# Patient Record
Sex: Female | Born: 1945 | Race: White | Hispanic: No | Marital: Married | Smoking: Never smoker
Health system: Southern US, Community
[De-identification: ages and names within clinical notes are randomized; demographics above are authoritative.]

## PROBLEM LIST (undated history)

## (undated) DIAGNOSIS — I890 Lymphedema, not elsewhere classified: Secondary | ICD-10-CM

## (undated) DIAGNOSIS — C189 Malignant neoplasm of colon, unspecified: Secondary | ICD-10-CM

## (undated) DIAGNOSIS — I4821 Permanent atrial fibrillation: Secondary | ICD-10-CM

## (undated) DIAGNOSIS — K5790 Diverticulosis of intestine, part unspecified, without perforation or abscess without bleeding: Secondary | ICD-10-CM

## (undated) DIAGNOSIS — K429 Umbilical hernia without obstruction or gangrene: Secondary | ICD-10-CM

## (undated) DIAGNOSIS — I4891 Unspecified atrial fibrillation: Secondary | ICD-10-CM

## (undated) DIAGNOSIS — I959 Hypotension, unspecified: Secondary | ICD-10-CM

## (undated) DIAGNOSIS — K859 Acute pancreatitis without necrosis or infection, unspecified: Secondary | ICD-10-CM

## (undated) DIAGNOSIS — E039 Hypothyroidism, unspecified: Secondary | ICD-10-CM

## (undated) DIAGNOSIS — I5042 Chronic combined systolic (congestive) and diastolic (congestive) heart failure: Secondary | ICD-10-CM

## (undated) DIAGNOSIS — K810 Acute cholecystitis: Secondary | ICD-10-CM

## (undated) DIAGNOSIS — E669 Obesity, unspecified: Secondary | ICD-10-CM

---

## 2005-12-27 ENCOUNTER — Ambulatory Visit: Payer: Self-pay | Admitting: Family Medicine

## 2006-01-03 ENCOUNTER — Ambulatory Visit: Payer: Self-pay | Admitting: Family Medicine

## 2006-01-31 ENCOUNTER — Ambulatory Visit: Payer: Self-pay | Admitting: Family Medicine

## 2012-05-27 DIAGNOSIS — R1084 Generalized abdominal pain: Secondary | ICD-10-CM

## 2012-10-10 ENCOUNTER — Emergency Department (HOSPITAL_COMMUNITY)
Admission: EM | Admit: 2012-10-10 | Discharge: 2012-10-10 | Disposition: A | Discharge status: Home / Self Care | Payer: Self-pay | Attending: Emergency Medicine | Admitting: Emergency Medicine

## 2012-10-10 ENCOUNTER — Emergency Department (HOSPITAL_COMMUNITY): Payer: Self-pay

## 2012-10-10 ENCOUNTER — Encounter (HOSPITAL_COMMUNITY): Payer: Self-pay | Admitting: *Deleted

## 2012-10-10 DIAGNOSIS — R109 Unspecified abdominal pain: Secondary | ICD-10-CM | POA: Insufficient documentation

## 2012-10-10 DIAGNOSIS — R110 Nausea: Secondary | ICD-10-CM | POA: Insufficient documentation

## 2012-10-10 LAB — HEPATIC FUNCTION PANEL
Bilirubin, Direct: 0.1 mg/dL (ref 0.0–0.3)
Indirect Bilirubin: 0.1 mg/dL — ABNORMAL LOW (ref 0.3–0.9)
Total Bilirubin: 0.2 mg/dL — ABNORMAL LOW (ref 0.3–1.2)

## 2012-10-10 LAB — CBC WITH DIFFERENTIAL/PLATELET
Basophils Absolute: 0 10*3/uL (ref 0.0–0.1)
Basophils Relative: 0 % (ref 0–1)
Eosinophils Relative: 2 % (ref 0–5)
HCT: 39.3 % (ref 36.0–46.0)
MCHC: 33.1 g/dL (ref 30.0–36.0)
MCV: 86.4 fL (ref 78.0–100.0)
Monocytes Absolute: 0.7 10*3/uL (ref 0.1–1.0)
Neutro Abs: 7 10*3/uL (ref 1.7–7.7)
Platelets: 228 10*3/uL (ref 150–400)
RDW: 13.7 % (ref 11.5–15.5)
WBC: 10.3 10*3/uL (ref 4.0–10.5)

## 2012-10-10 LAB — TROPONIN I: Troponin I: <0.3 ng/mL (ref <0.30)

## 2012-10-10 LAB — BASIC METABOLIC PANEL
Calcium: 9.6 mg/dL (ref 8.4–10.5)
Creatinine, Ser: 0.69 mg/dL (ref 0.50–1.10)
GFR calc Af Amer: >90 mL/min (ref >90)
Sodium: 132 mEq/L — ABNORMAL LOW (ref 135–145)

## 2012-10-10 LAB — LIPASE, BLOOD: Lipase: 23 U/L (ref 11–59)

## 2012-10-10 MED ORDER — ONDANSETRON HCL 4 MG/2ML IJ SOLN
4.0000 mg | Freq: Once | INTRAMUSCULAR | Status: AC
  Administered 2012-10-10: 4 mg via INTRAVENOUS
  Filled 2012-10-10: qty 2

## 2012-10-10 MED ORDER — ONDANSETRON HCL 4 MG PO TABS: 4.0000 mg | ORAL_TABLET | Freq: Four times a day (QID) | ORAL | Status: DC

## 2012-10-10 MED ORDER — MORPHINE SULFATE 2 MG/ML IJ SOLN
2.0000 mg | Freq: Once | INTRAMUSCULAR | Status: AC
  Administered 2012-10-10: 2 mg via INTRAVENOUS
  Filled 2012-10-10: qty 1

## 2012-10-10 MED ORDER — SODIUM CHLORIDE 0.9 % IV SOLN
Freq: Once | INTRAVENOUS | Status: AC
  Administered 2012-10-10: 03:00:00 via INTRAVENOUS

## 2012-10-10 MED ORDER — SIMETHICONE 80 MG PO CHEW
80.0000 mg | CHEWABLE_TABLET | Freq: Once | ORAL | Status: AC
  Administered 2012-10-10: 80 mg via ORAL
  Filled 2012-10-10: qty 1

## 2012-10-10 MED ORDER — PANTOPRAZOLE SODIUM 40 MG PO TBEC
40.0000 mg | DELAYED_RELEASE_TABLET | Freq: Once | ORAL | Status: AC
  Administered 2012-10-10: 40 mg via ORAL
  Filled 2012-10-10: qty 1

## 2012-10-10 MED ORDER — MORPHINE SULFATE 4 MG/ML IJ SOLN
4.0000 mg | Freq: Once | INTRAMUSCULAR | Status: AC
  Administered 2012-10-10: 4 mg via INTRAVENOUS
  Filled 2012-10-10: qty 1

## 2012-10-10 NOTE — ED Notes (Signed)
MD at bedside talking with patient and spouse

## 2012-10-10 NOTE — ED Provider Notes (Signed)
History     CSN: [id number]  Arrival date & time 10/10/12  0147   First MD Initiated Contact with Patient 10/10/12 0148      Chief Complaint  Patient presents with  . Abdominal Pain  . Back Pain    (Consider location/radiation/quality/duration/timing/severity/associated sxs/prior treatment) HPI  Lauren [name] is a 66 y.o. female who presents to the Emergency Department complaining of moderate to severe  abdominal pain that began at 2130 that radiates around to both sides and through to the back. It is accompanied by belching and mild nausea. Denies vomiting or diarrhea. Denies fever and chills. Has taken no medicines.   History reviewed. No pertinent past medical history.  History reviewed. No pertinent past surgical history.  History reviewed. No pertinent family history.  History  Substance Use Topics  . Smoking status: Never Smoker   . Smokeless tobacco: Not on file  . Alcohol Use: No    OB History    Grav Para Term Preterm Abortions TAB SAB Ect Mult Living                  Review of Systems  Constitutional: Negative for fever.       10 Systems reviewed and are negative for acute change except as noted in the HPI.  HENT: Negative for congestion.   Eyes: Negative for discharge and redness.  Respiratory: Negative for cough and shortness of breath.   Cardiovascular: Negative for chest pain.  Gastrointestinal: Positive for nausea and abdominal pain. Negative for vomiting.  Musculoskeletal: Negative for back pain.  Skin: Negative for rash.  Neurological: Negative for syncope, numbness and headaches.  Psychiatric/Behavioral:       No behavior change.    Allergies  Codeine and Compazine  Home Medications   Current Outpatient Rx  Name  Route  Sig  Dispense  Refill  . LEVOTHYROXINE SODIUM PO   Oral   Take by mouth.           BP 155/74  Pulse 62  Temp 97.6 F (36.4 C) (Oral)  Resp 24  Ht 5\' 2"  (1.575 m)  Wt 314 lb (142.429 kg)  BMI 57.43 kg/m2   SpO2 100%  LMP 10/10/2012  Physical Exam  Nursing note and vitals reviewed. Constitutional: She appears well-developed and well-nourished.       Awake, alert, nontoxic appearance.  HENT:  Head: Atraumatic.  Eyes: Right eye exhibits no discharge. Left eye exhibits no discharge.  Neck: Neck supple.  Cardiovascular: Normal heart sounds.   Pulmonary/Chest: Effort normal and breath sounds normal. She exhibits no tenderness.  Abdominal: Soft. Bowel sounds are normal. There is tenderness. There is no rebound and no guarding.       Epigastric pain with palpation.  Genitourinary:       No cva tenderness  Musculoskeletal: She exhibits no tenderness.       Baseline ROM, no obvious new focal weakness.  Neurological:       Mental status and motor strength appears baseline for patient and situation.  Skin: No rash noted.  Psychiatric: She has a normal mood and affect.    ED Course  Procedures (including critical care time) Results for orders placed during the hospital encounter of 10/10/12  CBC WITH DIFFERENTIAL      Component Value Range   WBC 10.3  4.0 - 10.5 K/uL   RBC 4.55  3.87 - 5.11 MIL/uL   Hemoglobin 13.0  12.0 - 15.0 g/dL   HCT 40.9  81.1 -  46.0 %   MCV 86.4  78.0 - 100.0 fL   MCH 28.6  26.0 - 34.0 pg   MCHC 33.1  30.0 - 36.0 g/dL   RDW 16.1  09.6 - 04.5 %   Platelets 228  150 - 400 K/uL   Neutrophils Relative 68  43 - 77 %   Neutro Abs 7.0  1.7 - 7.7 K/uL   Lymphocytes Relative 22  12 - 46 %   Lymphs Abs 2.3  0.7 - 4.0 K/uL   Monocytes Relative 7  3 - 12 %   Monocytes Absolute 0.7  0.1 - 1.0 K/uL   Eosinophils Relative 2  0 - 5 %   Eosinophils Absolute 0.2  0.0 - 0.7 K/uL   Basophils Relative 0  0 - 1 %   Basophils Absolute 0.0  0.0 - 0.1 K/uL  BASIC METABOLIC PANEL      Component Value Range   Sodium 132 (*) 135 - 145 mEq/L   Potassium 3.9  3.5 - 5.1 mEq/L   Chloride 97  96 - 112 mEq/L   CO2 22  19 - 32 mEq/L   Glucose, Bld 139 (*) 70 - 99 mg/dL   BUN 14  6 - 23  mg/dL   Creatinine, Ser 4.09  0.50 - 1.10 mg/dL   Calcium 9.6  8.4 - 81.1 mg/dL   GFR calc non Af Amer >90  >90 mL/min   GFR calc Af Amer >90  >90 mL/min  LIPASE, BLOOD      Component Value Range   Lipase 23  11 - 59 U/L  TROPONIN I      Component Value Range   Troponin I <0.30  <0.30 ng/mL  HEPATIC FUNCTION PANEL      Component Value Range   Total Protein 7.2  6.0 - 8.3 g/dL   Albumin 3.6  3.5 - 5.2 g/dL   AST 11  0 - 37 U/L   ALT 13  0 - 35 U/L   Alkaline Phosphatase 78  39 - 117 U/L   Total Bilirubin 0.2 (*) 0.3 - 1.2 mg/dL   Bilirubin, Direct 0.1  0.0 - 0.3 mg/dL   Indirect Bilirubin 0.1 (*) 0.3 - 0.9 mg/dL     Date: 91/47/8295   0242  Rate: 62  Rhythm: normal sinus rhythm with PACs  QRS Axis: normal  Intervals: normal  ST/T Wave abnormalities: normal  Conduction Disutrbances: none  Narrative Interpretation: unremarkable  Dg Abd Acute W/chest  10/10/2012  *RADIOLOGY REPORT*  Clinical Data: Abdominal pain  ACUTE ABDOMEN SERIES (ABDOMEN 2 VIEW & CHEST 1 VIEW)  Comparison: None.  Findings: Mild retrocardiac/ lingular opacity.  Heart size upper normal to mildly enlarged. No free intraperitoneal air. The bowel gas pattern is non-obstructive. Organ outlines are normal where seen. No acute or aggressive osseous abnormality identified.  IMPRESSION: Mild lingular/retrocardiac opacity; atelectasis versus infiltrate.  Nonobstructive bowel gas pattern.   Original Report Authenticated By: Jearld Lesch, M.D.       MDM  Patient with upper abdominal pain. Given analgesics x 2, antiemetic and simethicone with some relief. Labs unremarkable. EKG and troponin negative. Acute abdominal series with normal bowel gas pattern. Dx testing d/w pt and family.  Questions answered.  Verb understanding, agreeable to d/c home. Pt stable in ED with no significant deterioration in condition.The patient appears reasonably screened and/or stabilized for discharge and I doubt any other medical condition  or other [facility] requiring further screening, evaluation, or treatment in  the ED at this time prior to discharge.  MDM Reviewed: nursing note and vitals Interpretation: labs and x-ray           Nicoletta Dress. Colon Branch, MD 10/10/12 [phone]

## 2012-10-10 NOTE — ED Notes (Signed)
Pt c/o severe abdominal pain radiating to back since 9:30pm last night. Pt c/o nausea as well.

## 2012-10-10 NOTE — ED Notes (Addendum)
Constantly moaning with pain.  States she has absolutely no relief from medication.  MD informed and wants xrays completed prior to additional medication.  Patient does appear to be slightly more comfortable than upon arrival, is supine on stretcher and upon arrival could not be still.  Questioned about her food intake tonight.  Has been dieting and tonight ate small amounts of ice cream twice before the pain began

## 2012-10-10 NOTE — ED Notes (Signed)
C/o nausea and pain into her back

## 2012-10-10 NOTE — ED Notes (Signed)
Patient followed simethicon tablet with mountain dew.  Offered water - she declined. [place] Spaniel it would make her vomit.  Advised the [facility] will only add more gas to her with the carbonation in the drink.

## 2012-12-04 ENCOUNTER — Encounter (HOSPITAL_COMMUNITY): Payer: Self-pay | Admitting: Emergency Medicine

## 2012-12-04 ENCOUNTER — Emergency Department (HOSPITAL_COMMUNITY): Payer: Self-pay

## 2012-12-04 ENCOUNTER — Inpatient Hospital Stay (HOSPITAL_COMMUNITY)
Admission: EM | Admit: 2012-12-04 | Discharge: 2012-12-07 | DRG: 439 | Disposition: A | Discharge status: Home / Self Care | Payer: MEDICAID | Attending: Internal Medicine | Admitting: Internal Medicine

## 2012-12-04 ENCOUNTER — Inpatient Hospital Stay (HOSPITAL_COMMUNITY): Payer: Self-pay

## 2012-12-04 DIAGNOSIS — K8020 Calculus of gallbladder without cholecystitis without obstruction: Secondary | ICD-10-CM

## 2012-12-04 DIAGNOSIS — I4891 Unspecified atrial fibrillation: Secondary | ICD-10-CM | POA: Diagnosis not present

## 2012-12-04 DIAGNOSIS — E669 Obesity, unspecified: Secondary | ICD-10-CM | POA: Diagnosis present

## 2012-12-04 DIAGNOSIS — Z6841 Body Mass Index (BMI) 40.0 and over, adult: Secondary | ICD-10-CM

## 2012-12-04 DIAGNOSIS — K429 Umbilical hernia without obstruction or gangrene: Secondary | ICD-10-CM | POA: Diagnosis present

## 2012-12-04 DIAGNOSIS — K859 Acute pancreatitis without necrosis or infection, unspecified: Principal | ICD-10-CM | POA: Diagnosis present

## 2012-12-04 DIAGNOSIS — E876 Hypokalemia: Secondary | ICD-10-CM | POA: Diagnosis present

## 2012-12-04 DIAGNOSIS — E039 Hypothyroidism, unspecified: Secondary | ICD-10-CM | POA: Diagnosis present

## 2012-12-04 DIAGNOSIS — R7989 Other specified abnormal findings of blood chemistry: Secondary | ICD-10-CM | POA: Diagnosis present

## 2012-12-04 DIAGNOSIS — D72829 Elevated white blood cell count, unspecified: Secondary | ICD-10-CM | POA: Diagnosis present

## 2012-12-04 DIAGNOSIS — R51 Headache: Secondary | ICD-10-CM | POA: Diagnosis present

## 2012-12-04 DIAGNOSIS — K8000 Calculus of gallbladder with acute cholecystitis without obstruction: Secondary | ICD-10-CM | POA: Diagnosis present

## 2012-12-04 DIAGNOSIS — K810 Acute cholecystitis: Secondary | ICD-10-CM | POA: Diagnosis present

## 2012-12-04 HISTORY — DX: Hypothyroidism, unspecified: E03.9

## 2012-12-04 HISTORY — DX: Obesity, unspecified: E66.9

## 2012-12-04 HISTORY — DX: Acute cholecystitis: K81.0

## 2012-12-04 HISTORY — DX: Diverticulosis of intestine, part unspecified, without perforation or abscess without bleeding: K57.90

## 2012-12-04 HISTORY — DX: Acute pancreatitis without necrosis or infection, unspecified: K85.90

## 2012-12-04 HISTORY — DX: Unspecified atrial fibrillation: I48.91

## 2012-12-04 HISTORY — DX: Umbilical hernia without obstruction or gangrene: K42.9

## 2012-12-04 LAB — CBC WITH DIFFERENTIAL/PLATELET
Eosinophils Absolute: 0.1 10*3/uL (ref 0.0–0.7)
Lymphocytes Relative: 13 % (ref 12–46)
Lymphs Abs: 1.4 10*3/uL (ref 0.7–4.0)
Neutro Abs: 8.8 10*3/uL — ABNORMAL HIGH (ref 1.7–7.7)
Neutrophils Relative %: 79 % — ABNORMAL HIGH (ref 43–77)
Platelets: 231 10*3/uL (ref 150–400)
RBC: 4.49 MIL/uL (ref 3.87–5.11)
WBC: 11.1 10*3/uL — ABNORMAL HIGH (ref 4.0–10.5)

## 2012-12-04 LAB — COMPREHENSIVE METABOLIC PANEL
ALT: 175 U/L — ABNORMAL HIGH (ref 0–35)
Alkaline Phosphatase: 180 U/L — ABNORMAL HIGH (ref 39–117)
CO2: 24 mEq/L (ref 19–32)
Chloride: 100 mEq/L (ref 96–112)
GFR calc Af Amer: >90 mL/min (ref >90)
Glucose, Bld: 136 mg/dL — ABNORMAL HIGH (ref 70–99)
Potassium: 3.7 mEq/L (ref 3.5–5.1)
Sodium: 134 mEq/L — ABNORMAL LOW (ref 135–145)
Total Protein: 7.4 g/dL (ref 6.0–8.3)

## 2012-12-04 LAB — URINALYSIS, ROUTINE W REFLEX MICROSCOPIC
Glucose, UA: NEGATIVE mg/dL
Protein, ur: NEGATIVE mg/dL
Specific Gravity, Urine: 1.02 (ref 1.005–1.030)

## 2012-12-04 LAB — URINE MICROSCOPIC-ADD ON

## 2012-12-04 MED ORDER — HYDROMORPHONE HCL PF 1 MG/ML IJ SOLN
1.0000 mg | INTRAMUSCULAR | Status: DC | PRN
  Administered 2012-12-04 – 2012-12-05 (×6): 1 mg via INTRAVENOUS
  Filled 2012-12-04 (×6): qty 1

## 2012-12-04 MED ORDER — FAMOTIDINE IN NACL 20-0.9 MG/50ML-% IV SOLN
20.0000 mg | Freq: Once | INTRAVENOUS | Status: AC
  Administered 2012-12-04: 20 mg via INTRAVENOUS
  Filled 2012-12-04: qty 50

## 2012-12-04 MED ORDER — SODIUM CHLORIDE 0.9 % IV SOLN
INTRAVENOUS | Status: DC
  Administered 2012-12-04: 19:00:00 via INTRAVENOUS
  Administered 2012-12-04: 75 mL/h via INTRAVENOUS
  Administered 2012-12-04 – 2012-12-05 (×2): via INTRAVENOUS

## 2012-12-04 MED ORDER — IOHEXOL 300 MG/ML  SOLN
50.0000 mL | Freq: Once | INTRAMUSCULAR | Status: AC | PRN
  Administered 2012-12-04: 50 mL via ORAL

## 2012-12-04 MED ORDER — FENTANYL CITRATE 0.05 MG/ML IJ SOLN
50.0000 ug | INTRAMUSCULAR | Status: AC | PRN
  Administered 2012-12-04 (×2): 50 ug via INTRAVENOUS
  Filled 2012-12-04 (×2): qty 2

## 2012-12-04 MED ORDER — ONDANSETRON HCL 4 MG PO TABS: 4.0000 mg | ORAL_TABLET | Freq: Four times a day (QID) | ORAL | Status: DC | PRN

## 2012-12-04 MED ORDER — ONDANSETRON HCL 4 MG/2ML IJ SOLN
4.0000 mg | INTRAMUSCULAR | Status: DC | PRN
  Administered 2012-12-04: 4 mg via INTRAVENOUS
  Filled 2012-12-04: qty 2

## 2012-12-04 MED ORDER — MORPHINE SULFATE 4 MG/ML IJ SOLN
4.0000 mg | INTRAMUSCULAR | Status: DC | PRN
  Administered 2012-12-04: 4 mg via INTRAVENOUS
  Filled 2012-12-04: qty 1

## 2012-12-04 MED ORDER — LEVOTHYROXINE SODIUM 100 MCG PO TABS
100.0000 ug | ORAL_TABLET | Freq: Every day | ORAL | Status: DC
  Administered 2012-12-04 – 2012-12-07 (×4): 100 ug via ORAL
  Filled 2012-12-04 (×4): qty 1

## 2012-12-04 MED ORDER — HEPARIN SODIUM (PORCINE) 5000 UNIT/ML IJ SOLN
5000.0000 [IU] | Freq: Three times a day (TID) | INTRAMUSCULAR | Status: DC
  Administered 2012-12-04 – 2012-12-07 (×10): 5000 [IU] via SUBCUTANEOUS
  Filled 2012-12-04 (×10): qty 1

## 2012-12-04 MED ORDER — ONDANSETRON HCL 4 MG/2ML IJ SOLN
4.0000 mg | Freq: Four times a day (QID) | INTRAMUSCULAR | Status: DC | PRN
  Administered 2012-12-04 – 2012-12-05 (×3): 4 mg via INTRAVENOUS
  Filled 2012-12-04 (×3): qty 2

## 2012-12-04 MED ORDER — IOHEXOL 300 MG/ML  SOLN
100.0000 mL | Freq: Once | INTRAMUSCULAR | Status: AC | PRN
  Administered 2012-12-04: 100 mL via INTRAVENOUS

## 2012-12-04 NOTE — ED Provider Notes (Signed)
History     CSN: [id number]  Arrival date & time 12/04/12  4696   First MD Initiated Contact with Patient 12/04/12 [phone]      Chief Complaint  Patient presents with  . Abdominal Pain     HPI Pt was seen at 1025.   Per pt, c/o gradual onset and persistence of constant upper abd "pain" since yesterday.  Has been associated with nausea.  Describes the pain as "throbbing," and radiating into her back.  Denies diarrhea, no fevers, no flank pain, no rash, no CP/SOB, no black or blood in stools or emesis.       Past Medical History  Diagnosis Date  . Thyroid disease   . Obesity   . Diverticulosis   . Periumbilical hernia     History reviewed. No pertinent past surgical history.   History  Substance Use Topics  . Smoking status: Never Smoker   . Smokeless tobacco: Not on file  . Alcohol Use: No      Review of Systems ROS: Statement: All systems negative except as marked or noted in the HPI; Constitutional: Negative for fever and chills. ; ; Eyes: Negative for eye pain, redness and discharge. ; ; ENMT: Negative for ear pain, hoarseness, nasal congestion, sinus pressure and sore throat. ; ; Cardiovascular: Negative for chest pain, palpitations, diaphoresis, dyspnea and peripheral edema. ; ; Respiratory: Negative for cough, wheezing and stridor. ; ; Gastrointestinal: +abd pain, nausea. Negative for vomiting, diarrhea, blood in stool, hematemesis, jaundice and rectal bleeding. . ; ; Genitourinary: Negative for dysuria, flank pain and hematuria. ; ; Musculoskeletal: Negative for back pain and neck pain. Negative for swelling and trauma.; ; Skin: Negative for pruritus, rash, abrasions, blisters, bruising and skin lesion.; ; Neuro: Negative for headache, lightheadedness and neck stiffness. Negative for weakness, altered level of consciousness , altered mental status, extremity weakness, paresthesias, involuntary movement, seizure and syncope.       Allergies  Codeine and  Compazine  Home Medications   Current Outpatient Rx  Name  Route  Sig  Dispense  Refill  . LEVOTHYROXINE SODIUM 100 MCG PO TABS   Oral   Take 100 mcg by mouth daily.           LMP 10/04/2012  Physical Exam 1030: Physical examination:  Nursing notes reviewed; Vital signs and O2 SAT reviewed;  Constitutional: Well developed, Well nourished, Well hydrated, Uncomfortable appearing; Head:  Normocephalic, atraumatic; Eyes: EOMI, PERRL, No scleral icterus; ENMT: Mouth and pharynx normal, Mucous membranes moist; Neck: Supple, Full range of motion, No lymphadenopathy; Cardiovascular: Regular rate and rhythm, No gallop; Respiratory: Breath sounds clear & equal bilaterally, No rales, rhonchi, wheezes.  Speaking full sentences with ease, Normal respiratory effort/excursion; Chest: Nontender, Movement normal; Abdomen: Soft, obese. +RUQ, mid-epigastric, LUQ tender to palp. Nondistended, Normal bowel sounds;; Extremities: Pulses normal, No tenderness, No edema, No calf edema or asymmetry.; Neuro: AA&Ox3, Major CN grossly intact.  Speech clear. No gross focal motor or sensory deficits in extremities.; Skin: Color normal, Warm, Dry.   ED Course  Procedures    MDM  MDM Reviewed: nursing note, vitals and previous chart Reviewed previous: labs and ECG Interpretation: labs, x-ray, CT scan and ECG    Date: 12/04/2012  Rate: 62  Rhythm: normal sinus rhythm  QRS Axis: normal  Intervals: normal  ST/T Wave abnormalities: normal  Conduction Disutrbances:none  Narrative Interpretation:   Old EKG Reviewed: unchanged; no significant changes compared to previous EKG dated 10/10/2012.   Results for  orders placed during the hospital encounter of 12/04/12  CBC WITH DIFFERENTIAL      Component Value Range   WBC 11.1 (*) 4.0 - 10.5 K/uL   RBC 4.49  3.87 - 5.11 MIL/uL   Hemoglobin 12.9  12.0 - 15.0 g/dL   HCT 16.1  09.6 - 04.5 %   MCV 86.4  78.0 - 100.0 fL   MCH 28.7  26.0 - 34.0 pg   MCHC 33.2  30.0  - 36.0 g/dL   RDW 40.9  81.1 - 91.4 %   Platelets 231  150 - 400 K/uL   Neutrophils Relative 79 (*) 43 - 77 %   Neutro Abs 8.8 (*) 1.7 - 7.7 K/uL   Lymphocytes Relative 13  12 - 46 %   Lymphs Abs 1.4  0.7 - 4.0 K/uL   Monocytes Relative 8  3 - 12 %   Monocytes Absolute 0.8  0.1 - 1.0 K/uL   Eosinophils Relative 1  0 - 5 %   Eosinophils Absolute 0.1  0.0 - 0.7 K/uL   Basophils Relative 0  0 - 1 %   Basophils Absolute 0.0  0.0 - 0.1 K/uL  COMPREHENSIVE METABOLIC PANEL      Component Value Range   Sodium 134 (*) 135 - 145 mEq/L   Potassium 3.7  3.5 - 5.1 mEq/L   Chloride 100  96 - 112 mEq/L   CO2 24  19 - 32 mEq/L   Glucose, Bld 136 (*) 70 - 99 mg/dL   BUN 8  6 - 23 mg/dL   Creatinine, Ser 7.82  0.50 - 1.10 mg/dL   Calcium 9.2  8.4 - 95.6 mg/dL   Total Protein 7.4  6.0 - 8.3 g/dL   Albumin 3.3 (*) 3.5 - 5.2 g/dL   AST 213 (*) 0 - 37 U/L   ALT 175 (*) 0 - 35 U/L   Alkaline Phosphatase 180 (*) 39 - 117 U/L   Total Bilirubin 2.2 (*) 0.3 - 1.2 mg/dL   GFR calc non Af Amer >90  >90 mL/min   GFR calc Af Amer >90  >90 mL/min  LIPASE, BLOOD      Component Value Range   Lipase >3000 (*) 11 - 59 U/L  TROPONIN I      Component Value Range   Troponin I <0.30  <0.30 ng/mL  URINALYSIS, ROUTINE W REFLEX MICROSCOPIC      Component Value Range   Color, Urine AMBER (*) YELLOW   APPearance HAZY (*) CLEAR   Specific Gravity, Urine 1.020  1.005 - 1.030   pH 6.5  5.0 - 8.0   Glucose, UA NEGATIVE  NEGATIVE mg/dL   Hgb urine dipstick SMALL (*) NEGATIVE   Bilirubin Urine MODERATE (*) NEGATIVE   Ketones, ur NEGATIVE  NEGATIVE mg/dL   Protein, ur NEGATIVE  NEGATIVE mg/dL   Urobilinogen, UA 1.0  0.0 - 1.0 mg/dL   Nitrite NEGATIVE  NEGATIVE   Leukocytes, UA NEGATIVE  NEGATIVE  URINE MICROSCOPIC-ADD ON      Component Value Range   Squamous Epithelial / LPF FEW (*) RARE   RBC / HPF 3-6  <3 RBC/hpf   Ct Abdomen Pelvis W Contrast 12/04/2012  *RADIOLOGY REPORT*  Clinical Data: Abdominal pain.   Nausea and vomiting.  CT ABDOMEN AND PELVIS WITH CONTRAST  Technique:  Multidetector CT imaging of the abdomen and pelvis was performed following the standard protocol during bolus administration of intravenous contrast.  Contrast:  100 ml Omnipaque-300 and  oral contrast  Comparison: 05/27/2012  Findings: There appears to be mild swelling of the pancreatic tail with a subtle peripancreatic soft tissue stranding which is new since previous study.  This is suspicious for mild acute pancreatitis.  There is no evidence of pancreatic mass or pancreatic ductal dilatation. No evidence of pancreatic necrosis or peri pancreatic fluid collections.  Gallbladder is unremarkable, and the there is no evidence of biliary ductal dilatation.  The liver, spleen, pancreas, and kidneys are normal in appearance.  No evidence of hydronephrosis. No soft tissue masses or lymphadenopathy identified within the abdomen or pelvis.  Uterus and adnexal regions are unremarkable.  Diverticulosis is again seen mainly involving the sigmoid colon, however there is no evidence of diverticulitis.  No evidence of abscess.  No evidence of dilated bowel loops.  A small periumbilical hernia is again seen containing only omental fat.  No herniated bowel loops.  IMPRESSION:  1.  Findings suspicious for mild acute pancreatitis, mainly involving the tail.  Suggest correlation with serum amylase and lipase levels. No evidence of peripancreatic fluid collections or other complication. 2.  Diverticulosis.  No radiographic evidence of diverticulitis. 3.  Stable small periumbilical hernia containing only omental fat.   Original Report Authenticated By: Myles Rosenthal, M.D.    Dg Chest Port 1 View 12/04/2012  *RADIOLOGY REPORT*  Clinical Data: Cough.  Congestive heart failure.  PORTABLE CHEST - 1 VIEW  Comparison: 10/10/2012  Findings: Mild cardiomegaly noted.  Prominent epicardial fat pad appears stable.  The visualized lungs appear clear.  IMPRESSION:  1.  Mild  cardiomegaly with prominent epicardial adipose tissue.   Original Report Authenticated By: Gaylyn Rong, M.D.       1310:  Continues to c/o abd pain and nausea despite multiple doses of meds.  Will re-medicate. Dx and testing d/w pt and family.  Questions answered.  Verb understanding, agreeable to admit.  T/C to [facility] Dr. Karilyn Cota, case discussed, including:  HPI, pertinent PM/SHx, VS/PE, dx testing, ED course and treatment:  Agreeable to admit.        Laray Anger, DO 12/05/12 1951

## 2012-12-04 NOTE — ED Notes (Signed)
Report called to [place], RN on unit 300.  Pt. Taken to [place] from ER.

## 2012-12-04 NOTE — H&P (Signed)
[facility] History and Physical  Lauren [name] ZOX:[id number] DOB: 1946/03/12 DOA: 12/04/2012  Referring physician: Dr. Clarene Duke, ER physician.     Chief Complaint: Abdominal pain.  HPI: Lauren [name] is a 67 y.o. female presents with the third episode of abdominal pain. Her first episode was in July of 2013, her next episode was in November and this episode started last night. Each episode is characterized by abdominal pain, usually umbilical but could be epigastric and always radiating to the back. It is associated with some nausea but no significant vomiting. She had been seen in November in the emergency room with similar symptoms and workup on that occasion was essentially negative. However today and she was seen again her lipase is significantly elevated with elevation of liver enzymes. She is now being admitted for treatment and management of pancreatitis. She denies a history of gallstones. She does not drink alcohol excessively. She has no history of hyperlipidemia. She does have a history of hypothyroidism. She is obese.   Review of Systems: Apart from symptoms in the history of present illness, other systems negative.  Past Medical History  Diagnosis Date  . Thyroid disease   . Obesity   . Diverticulosis   . Periumbilical hernia     Social History:  She is married and lives with her husband. She does not smoke cigarettes. She very occasionally drinks alcohol, and the last thing she had was on Christmas. She is otherwise fully independent.  Allergies  Allergen Reactions  . Codeine Itching  . Compazine (Prochlorperazine Edisylate)     History reviewed. No pertinent family history. there is no family history of gallstones/gallbladder pathology.   Prior to Admission medications   Medication Sig Start Date End Date Taking? Authorizing Provider  levothyroxine (SYNTHROID, LEVOTHROID) 100 MCG tablet Take 100 mcg by mouth daily.   Yes Historical Provider, MD    Physical Exam: Filed Vitals:   12/04/12 1347  BP: 180/76  Pulse: 77  Temp: 97.6 F (36.4 C)  Resp: 18  SpO2: 98%     General:  She is to look systemically well. She is in some pain. She is morbidly obese.  Eyes: No jaundice. No pallor.  ENT: No abnormalities.  Neck: Thick neck. No lymphadenopathy.  Cardiovascular: Heart sounds are present without murmurs or gallop rhythm.  Respiratory: Lung fields are clear.  Abdomen: Abdomen is actually soft and nontender. There no masses felt although this is difficult to determine secondary to her obese abdomen.  Skin: No rashes.  Musculoskeletal: No major joint abnormalities.  Psychiatric: Appropriate affect.  Neurologic: Alert and orientated without any focal neurological signs.  Labs on Admission:  Basic Metabolic Panel:  Lab 12/04/12 8119  NA 134*  K 3.7  CL 100  CO2 24  GLUCOSE 136*  BUN 8  CREATININE 0.66  CALCIUM 9.2  MG --  PHOS --   Liver Function Tests:  Lab 12/04/12 1034  AST 247*  ALT 175*  ALKPHOS 180*  BILITOT 2.2*  PROT 7.4  ALBUMIN 3.3*    Lab 12/04/12 1034  LIPASE >3000*  AMYLASE --    CBC:  Lab 12/04/12 1034  WBC 11.1*  NEUTROABS 8.8*  HGB 12.9  HCT 38.8  MCV 86.4  PLT 231   Cardiac Enzymes:  Lab 12/04/12 1034  CKTOTAL --  CKMB --  CKMBINDEX --  TROPONINI <0.30      Radiological Exams on Admission: Ct Abdomen Pelvis W Contrast  12/04/2012  *RADIOLOGY REPORT*  Clinical Data:  Abdominal pain.  Nausea and vomiting.  CT ABDOMEN AND PELVIS WITH CONTRAST  Technique:  Multidetector CT imaging of the abdomen and pelvis was performed following the standard protocol during bolus administration of intravenous contrast.  Contrast:  100 ml Omnipaque-300 and oral contrast  Comparison: 05/27/2012  Findings: There appears to be mild swelling of the pancreatic tail with a subtle peripancreatic soft tissue stranding which is new since previous study.  This is suspicious for mild acute  pancreatitis.  There is no evidence of pancreatic mass or pancreatic ductal dilatation. No evidence of pancreatic necrosis or peri pancreatic fluid collections.  Gallbladder is unremarkable, and the there is no evidence of biliary ductal dilatation.  The liver, spleen, pancreas, and kidneys are normal in appearance.  No evidence of hydronephrosis. No soft tissue masses or lymphadenopathy identified within the abdomen or pelvis.  Uterus and adnexal regions are unremarkable.  Diverticulosis is again seen mainly involving the sigmoid colon, however there is no evidence of diverticulitis.  No evidence of abscess.  No evidence of dilated bowel loops.  A small periumbilical hernia is again seen containing only omental fat.  No herniated bowel loops.  IMPRESSION:  1.  Findings suspicious for mild acute pancreatitis, mainly involving the tail.  Suggest correlation with serum amylase and lipase levels. No evidence of peripancreatic fluid collections or other complication. 2.  Diverticulosis.  No radiographic evidence of diverticulitis. 3.  Stable small periumbilical hernia containing only omental fat.   Original Report Authenticated By: Myles Rosenthal, M.D.    Dg Chest Port 1 View  12/04/2012  *RADIOLOGY REPORT*  Clinical Data: Cough.  Congestive heart failure.  PORTABLE CHEST - 1 VIEW  Comparison: 10/10/2012  Findings: Mild cardiomegaly noted.  Prominent epicardial fat pad appears stable.  The visualized lungs appear clear.  IMPRESSION:  1.  Mild cardiomegaly with prominent epicardial adipose tissue.   Original Report Authenticated By: Gaylyn Rong, M.D.       Assessment/Plan   1. Acute pancreatitis, likely secondary to gallstone pathology. 2. Elevated liver enzymes, likely secondary to gallstones. 3. Hypothyroidism. 4. Obesity, morbid.  Plan: 1. Admit to medical floor. 2. N.p.o. and IV fluids. 3. Intravenous analgesia as required. 4. Ultrasound of the abdomen in particular looking at the biliary  system. 5. Gastroenterology consultation Further recommendations will depend on patient's hospital progress.  Code Status: Full code.  Family Communication: Discuss plan with patient and husband at the bedside.   Disposition Plan:Home in medically stable.    Time spent: 45 mins  [facility] Pager [phone].  If 7PM-7AM, please contact night-coverage [email or web address] Password [email or web address] 12/04/2012, 2:06 PM

## 2012-12-04 NOTE — ED Notes (Signed)
Severe abd pain. Denies diarrhea and vomiting.

## 2012-12-05 DIAGNOSIS — K859 Acute pancreatitis without necrosis or infection, unspecified: Secondary | ICD-10-CM

## 2012-12-05 LAB — LIPID PANEL
Cholesterol: 190 mg/dL (ref 0–200)
Total CHOL/HDL Ratio: 2.7 RATIO
Triglycerides: 157 mg/dL — ABNORMAL HIGH (ref <150)
VLDL: 31 mg/dL (ref 0–40)

## 2012-12-05 LAB — CBC
Hemoglobin: 12.4 g/dL (ref 12.0–15.0)
MCH: 29.1 pg (ref 26.0–34.0)
Platelets: 213 10*3/uL (ref 150–400)
RBC: 4.26 MIL/uL (ref 3.87–5.11)
WBC: 13.8 10*3/uL — ABNORMAL HIGH (ref 4.0–10.5)

## 2012-12-05 LAB — COMPREHENSIVE METABOLIC PANEL
AST: 108 U/L — ABNORMAL HIGH (ref 0–37)
BUN: 5 mg/dL — ABNORMAL LOW (ref 6–23)
CO2: 25 mEq/L (ref 19–32)
Calcium: 8.5 mg/dL (ref 8.4–10.5)
Chloride: 98 mEq/L (ref 96–112)
Creatinine, Ser: 0.68 mg/dL (ref 0.50–1.10)
GFR calc Af Amer: >90 mL/min (ref >90)
GFR calc non Af Amer: >90 mL/min (ref >90)
Glucose, Bld: 82 mg/dL (ref 70–99)
Total Bilirubin: 0.6 mg/dL (ref 0.3–1.2)

## 2012-12-05 LAB — HEMOGLOBIN A1C: Mean Plasma Glucose: 123 mg/dL — ABNORMAL HIGH (ref <117)

## 2012-12-05 MED ORDER — ONDANSETRON HCL 4 MG PO TABS: 4.0000 mg | ORAL_TABLET | ORAL | Status: DC | PRN

## 2012-12-05 MED ORDER — ONDANSETRON HCL 4 MG/2ML IJ SOLN: 4.0000 mg | Freq: Four times a day (QID) | INTRAMUSCULAR | Status: DC | PRN

## 2012-12-05 MED ORDER — ONDANSETRON HCL 4 MG/2ML IJ SOLN
4.0000 mg | INTRAMUSCULAR | Status: DC | PRN
  Administered 2012-12-05 – 2012-12-07 (×9): 4 mg via INTRAVENOUS
  Filled 2012-12-05 (×9): qty 2

## 2012-12-05 MED ORDER — PANTOPRAZOLE SODIUM 40 MG IV SOLR
40.0000 mg | INTRAVENOUS | Status: DC
  Administered 2012-12-05 – 2012-12-06 (×2): 40 mg via INTRAVENOUS
  Filled 2012-12-05 (×2): qty 40

## 2012-12-05 MED ORDER — HYDROMORPHONE HCL PF 1 MG/ML IJ SOLN
1.0000 mg | INTRAMUSCULAR | Status: DC | PRN
  Administered 2012-12-05 – 2012-12-07 (×14): 1 mg via INTRAVENOUS
  Filled 2012-12-05 (×14): qty 1

## 2012-12-05 MED ORDER — ACETAMINOPHEN 325 MG PO TABS
650.0000 mg | ORAL_TABLET | Freq: Four times a day (QID) | ORAL | Status: DC | PRN
  Administered 2012-12-05: 650 mg via ORAL
  Filled 2012-12-05: qty 2

## 2012-12-05 MED ORDER — DEXTROSE 5 % IV SOLN
1.0000 g | INTRAVENOUS | Status: DC
  Administered 2012-12-05 – 2012-12-06 (×2): 1 g via INTRAVENOUS
  Filled 2012-12-05 (×3): qty 10

## 2012-12-05 NOTE — Progress Notes (Signed)
Subjective: This lady feels reasonably well except for pain. This has been controlled by intravenous hydromorphone. Ultrasound of the abdomen confirmed the presence of gallstones and I think this is likely gallstone pancreatitis.           Physical Exam: Blood pressure 131/76, pulse 76, temperature 100.1 F (37.8 C), temperature source Oral, resp. rate 18, height 5\' 2"  (1.575 m), weight 142.429 kg (314 lb), last menstrual period 10/04/2012, SpO2 93.00%. She looks systemically well. She does not look septic or toxic. Abdomen is soft and really nontender. Bowel sounds are heard and are normal. Lung fields are clear. She is alert and orientated.   Investigations:  No results found for this or any previous visit (from the past 240 hour(s)).   Basic Metabolic Panel:  Basename 12/05/12 0530 12/04/12 1034  NA 133* 134*  K 3.6 3.7  CL 98 100  CO2 25 24  GLUCOSE 82 136*  BUN 5* 8  CREATININE 0.68 0.66  CALCIUM 8.5 9.2  MG -- --  PHOS -- --   Liver Function Tests:  [facility] 12/05/12 0530 12/04/12 1034  AST 108* 247*  ALT 132* 175*  ALKPHOS 173* 180*  BILITOT 0.6 2.2*  PROT 7.2 7.4  ALBUMIN 3.1* 3.3*     CBC:  Basename 12/05/12 0530 12/04/12 1034  WBC 13.8* 11.1*  NEUTROABS -- 8.8*  HGB 12.4 12.9  HCT 37.9 38.8  MCV 89.0 86.4  PLT 213 231    Ct Abdomen Pelvis W Contrast  12/04/2012  *RADIOLOGY REPORT*  Clinical Data: Abdominal pain.  Nausea and vomiting.  CT ABDOMEN AND PELVIS WITH CONTRAST  Technique:  Multidetector CT imaging of the abdomen and pelvis was performed following the standard protocol during bolus administration of intravenous contrast.  Contrast:  100 ml Omnipaque-300 and oral contrast  Comparison: 05/27/2012  Findings: There appears to be mild swelling of the pancreatic tail with a subtle peripancreatic soft tissue stranding which is new since previous study.  This is suspicious for mild acute pancreatitis.  There is no evidence of pancreatic  mass or pancreatic ductal dilatation. No evidence of pancreatic necrosis or peri pancreatic fluid collections.  Gallbladder is unremarkable, and the there is no evidence of biliary ductal dilatation.  The liver, spleen, pancreas, and kidneys are normal in appearance.  No evidence of hydronephrosis. No soft tissue masses or lymphadenopathy identified within the abdomen or pelvis.  Uterus and adnexal regions are unremarkable.  Diverticulosis is again seen mainly involving the sigmoid colon, however there is no evidence of diverticulitis.  No evidence of abscess.  No evidence of dilated bowel loops.  A small periumbilical hernia is again seen containing only omental fat.  No herniated bowel loops.  IMPRESSION:  1.  Findings suspicious for mild acute pancreatitis, mainly involving the tail.  Suggest correlation with serum amylase and lipase levels. No evidence of peripancreatic fluid collections or other complication. 2.  Diverticulosis.  No radiographic evidence of diverticulitis. 3.  Stable small periumbilical hernia containing only omental fat.   Original Report Authenticated By: Myles Rosenthal, M.D.    Dg Chest Port 1 View  12/04/2012  *RADIOLOGY REPORT*  Clinical Data: Cough.  Congestive heart failure.  PORTABLE CHEST - 1 VIEW  Comparison: 10/10/2012  Findings: Mild cardiomegaly noted.  Prominent epicardial fat pad appears stable.  The visualized lungs appear clear.  IMPRESSION:  1.  Mild cardiomegaly with prominent epicardial adipose tissue.   Original Report Authenticated By: Gaylyn Rong, M.D.  US Abdomen Limited Ruq  12/04/2012  *RADIOLOGY REPORT*  Clinical Data:  Acute pancreatitis.  Evaluate for gallstones as the etiology.  LIMITED ABDOMINAL ULTRASOUND - RIGHT UPPER QUADRANT  Comparison:  CT abdomen and pelvis earlier same date.  Findings:  Gallbladder:  Numerous shadowing calculi small amount of associated echogenic sludge.  No gallbladder wall thickening or pericholecystic fluid.  Positive  sonographic Murphy's sign according to the ultrasound technologist.  Common bile duct:  Mildly dilated up to approximately 10 mm diameter centrally.  Obscured distally by duodenal bowel gas.  No visible common duct stones on the earlier CT.  Liver:  Diffusely increased and coarsened echotexture without focal parenchymal abnormality.  Patent portal vein with hepatopetal flow.  IMPRESSION:  1.  Cholelithiasis and gallbladder sludge.  Positive sonographic Murphy's sign without other sonographic findings for acute cholecystitis. 2.  Mild biliary ductal dilation up to 10 mm centrally; distal duct obscured by overlying bowel gas, but no visible bile duct stones on the earlier CT. 3.  Diffuse hepatic steatosis and/or hepatocellular disease without focal hepatic parenchymal abnormality.                    Original Report Authenticated By: Hulan Saas, M.D.       Medications: I have reviewed the patient's current medications.  Impression: 1. Acute pancreatitis. 2. Gallstones likely causing #1. 3. Hypothyroidism. 4. Morbid obesity.     Plan: 1. Try to advance her diet to a clear liquid diet today. 2. Surgical consultation.     LOS: 1 day   Wilson Singer Pager [phone]  12/05/2012, 8:29 AM

## 2012-12-05 NOTE — Consult Note (Signed)
Reason for Consult: Acute pancreatitis Referring Physician: [facility] hospitalist  Lauren [name] is an 67 y.o. female.  HPI: Patient presented to [facility] with epigastric abdominal pain nausea. She had a similar symptomatology several years ago but no intermittent episodes. Pain has persisted in the epigastric region. Having clear liquids today she states the pain is somewhat worse after by mouth intake. She has had associated nausea but no emesis today. She has had subjective fevers and chills. No change in bowel movements. No melena or hematochezia. No alcohol use. No unusual travel. Patient has had a previous pregnancy. No significant family history of biliary disease. Patient states occasional bloating with fatty greasy foods. Patient is never been told she has biliary disease in the past. No history of jaundice.  Past Medical History  Diagnosis Date  . Thyroid disease   . Obesity   . Diverticulosis   . Periumbilical hernia   . Hypothyroidism     History reviewed. No pertinent past surgical history.  History reviewed. No pertinent family history.  Social History:  reports that she has never smoked. She does not have any smokeless tobacco history on file. She reports that she does not drink alcohol or use illicit drugs.  Allergies:  Allergies  Allergen Reactions  . Codeine Itching  . Compazine (Prochlorperazine Edisylate)   . Prednisone Nausea And Vomiting    Medications:  I have reviewed the patient's current medications. Prior to Admission:  Prescriptions prior to admission  Medication Sig Dispense Refill  . levothyroxine (SYNTHROID, LEVOTHROID) 100 MCG tablet Take 100 mcg by mouth daily.       Scheduled:   . heparin  5,000 Units Subcutaneous Q8H  . levothyroxine  100 mcg Oral QAC breakfast   Continuous:   . sodium chloride 75 mL/hr at 12/05/12 1034   ONG:[id number], HYDROmorphone (DILAUDID) injection, ondansetron (ZOFRAN) IV, ondansetron Anti-infectives     None      Results for orders placed during the hospital encounter of 12/04/12 (from the past 48 hour(s))  CBC WITH DIFFERENTIAL     Status: Abnormal   Collection Time   12/04/12 10:34 AM      Component Value Range Comment   WBC 11.1 (*) 4.0 - 10.5 K/uL    RBC 4.49  3.87 - 5.11 MIL/uL    Hemoglobin 12.9  12.0 - 15.0 g/dL    HCT 02.7  25.3 - 66.4 %    MCV 86.4  78.0 - 100.0 fL    MCH 28.7  26.0 - 34.0 pg    MCHC 33.2  30.0 - 36.0 g/dL    RDW 40.3  47.4 - 25.9 %    Platelets 231  150 - 400 K/uL    Neutrophils Relative 79 (*) 43 - 77 %    Neutro Abs 8.8 (*) 1.7 - 7.7 K/uL    Lymphocytes Relative 13  12 - 46 %    Lymphs Abs 1.4  0.7 - 4.0 K/uL    Monocytes Relative 8  3 - 12 %    Monocytes Absolute 0.8  0.1 - 1.0 K/uL    Eosinophils Relative 1  0 - 5 %    Eosinophils Absolute 0.1  0.0 - 0.7 K/uL    Basophils Relative 0  0 - 1 %    Basophils Absolute 0.0  0.0 - 0.1 K/uL   COMPREHENSIVE METABOLIC PANEL     Status: Abnormal   Collection Time   12/04/12 10:34 AM      Component  Value Range Comment   Sodium 134 (*) 135 - 145 mEq/L    Potassium 3.7  3.5 - 5.1 mEq/L    Chloride 100  96 - 112 mEq/L    CO2 24  19 - 32 mEq/L    Glucose, Bld 136 (*) 70 - 99 mg/dL    BUN 8  6 - 23 mg/dL    Creatinine, Ser 2.13  0.50 - 1.10 mg/dL    Calcium 9.2  8.4 - 08.6 mg/dL    Total Protein 7.4  6.0 - 8.3 g/dL    Albumin 3.3 (*) 3.5 - 5.2 g/dL    AST 578 (*) 0 - 37 U/L    ALT 175 (*) 0 - 35 U/L    Alkaline Phosphatase 180 (*) 39 - 117 U/L    Total Bilirubin 2.2 (*) 0.3 - 1.2 mg/dL    GFR calc non Af Amer >90  >90 mL/min    GFR calc Af Amer >90  >90 mL/min   LIPASE, BLOOD     Status: Abnormal   Collection Time   12/04/12 10:34 AM      Component Value Range Comment   Lipase >3000 (*) 11 - 59 U/L   TROPONIN I     Status: Normal   Collection Time   12/04/12 10:34 AM      Component Value Range Comment   Troponin I <0.30  <0.30 ng/mL   TSH     Status: Normal   Collection Time   12/04/12 10:34  AM      Component Value Range Comment   TSH 3.539  0.350 - 4.500 uIU/mL   HEMOGLOBIN A1C     Status: Abnormal   Collection Time   12/04/12 10:34 AM      Component Value Range Comment   Hemoglobin A1C 5.9 (*) <5.7 %    Mean Plasma Glucose 123 (*) <117 mg/dL   URINALYSIS, ROUTINE W REFLEX MICROSCOPIC     Status: Abnormal   Collection Time   12/04/12 10:37 AM      Component Value Range Comment   Color, Urine AMBER (*) YELLOW BIOCHEMICALS MAY BE AFFECTED BY COLOR   APPearance HAZY (*) CLEAR    Specific Gravity, Urine 1.020  1.005 - 1.030    pH 6.5  5.0 - 8.0    Glucose, UA NEGATIVE  NEGATIVE mg/dL    Hgb urine dipstick SMALL (*) NEGATIVE    Bilirubin Urine MODERATE (*) NEGATIVE    Ketones, ur NEGATIVE  NEGATIVE mg/dL    Protein, ur NEGATIVE  NEGATIVE mg/dL    Urobilinogen, UA 1.0  0.0 - 1.0 mg/dL    Nitrite NEGATIVE  NEGATIVE    Leukocytes, UA NEGATIVE  NEGATIVE   URINE MICROSCOPIC-ADD ON     Status: Abnormal   Collection Time   12/04/12 10:37 AM      Component Value Range Comment   Squamous Epithelial / LPF FEW (*) RARE    RBC / HPF 3-6  <3 RBC/hpf   COMPREHENSIVE METABOLIC PANEL     Status: Abnormal   Collection Time   12/05/12  5:30 AM      Component Value Range Comment   Sodium 133 (*) 135 - 145 mEq/L    Potassium 3.6  3.5 - 5.1 mEq/L    Chloride 98  96 - 112 mEq/L    CO2 25  19 - 32 mEq/L    Glucose, Bld 82  70 - 99 mg/dL    BUN 5 (*)  6 - 23 mg/dL    Creatinine, Ser 1.61  0.50 - 1.10 mg/dL    Calcium 8.5  8.4 - 09.6 mg/dL    Total Protein 7.2  6.0 - 8.3 g/dL    Albumin 3.1 (*) 3.5 - 5.2 g/dL    AST 045 (*) 0 - 37 U/L    ALT 132 (*) 0 - 35 U/L    Alkaline Phosphatase 173 (*) 39 - 117 U/L    Total Bilirubin 0.6  0.3 - 1.2 mg/dL    GFR calc non Af Amer >90  >90 mL/min    GFR calc Af Amer >90  >90 mL/min   CBC     Status: Abnormal   Collection Time   12/05/12  5:30 AM      Component Value Range Comment   WBC 13.8 (*) 4.0 - 10.5 K/uL    RBC 4.26  3.87 - 5.11 MIL/uL     Hemoglobin 12.4  12.0 - 15.0 g/dL    HCT 40.9  81.1 - 91.4 %    MCV 89.0  78.0 - 100.0 fL    MCH 29.1  26.0 - 34.0 pg    MCHC 32.7  30.0 - 36.0 g/dL    RDW 78.2  95.6 - 21.3 %    Platelets 213  150 - 400 K/uL   LIPID PANEL     Status: Abnormal   Collection Time   12/05/12  5:31 AM      Component Value Range Comment   Cholesterol 190  0 - 200 mg/dL    Triglycerides 086 (*) <150 mg/dL    HDL 70  >57 mg/dL    Total CHOL/HDL Ratio 2.7      VLDL 31  0 - 40 mg/dL    LDL Cholesterol 89  0 - 99 mg/dL     Ct Abdomen Pelvis W Contrast  12/04/2012  *RADIOLOGY REPORT*  Clinical Data: Abdominal pain.  Nausea and vomiting.  CT ABDOMEN AND PELVIS WITH CONTRAST  Technique:  Multidetector CT imaging of the abdomen and pelvis was performed following the standard protocol during bolus administration of intravenous contrast.  Contrast:  100 ml Omnipaque-300 and oral contrast  Comparison: 05/27/2012  Findings: There appears to be mild swelling of the pancreatic tail with a subtle peripancreatic soft tissue stranding which is new since previous study.  This is suspicious for mild acute pancreatitis.  There is no evidence of pancreatic mass or pancreatic ductal dilatation. No evidence of pancreatic necrosis or peri pancreatic fluid collections.  Gallbladder is unremarkable, and the there is no evidence of biliary ductal dilatation.  The liver, spleen, pancreas, and kidneys are normal in appearance.  No evidence of hydronephrosis. No soft tissue masses or lymphadenopathy identified within the abdomen or pelvis.  Uterus and adnexal regions are unremarkable.  Diverticulosis is again seen mainly involving the sigmoid colon, however there is no evidence of diverticulitis.  No evidence of abscess.  No evidence of dilated bowel loops.  A small periumbilical hernia is again seen containing only omental fat.  No herniated bowel loops.  IMPRESSION:  1.  Findings suspicious for mild acute pancreatitis, mainly involving the tail.   Suggest correlation with serum amylase and lipase levels. No evidence of peripancreatic fluid collections or other complication. 2.  Diverticulosis.  No radiographic evidence of diverticulitis. 3.  Stable small periumbilical hernia containing only omental fat.   Original Report Authenticated By: Myles Rosenthal, M.D.    Dg Chest Port 1 View  12/04/2012  *  RADIOLOGY REPORT*  Clinical Data: Cough.  Congestive heart failure.  PORTABLE CHEST - 1 VIEW  Comparison: 10/10/2012  Findings: Mild cardiomegaly noted.  Prominent epicardial fat pad appears stable.  The visualized lungs appear clear.  IMPRESSION:  1.  Mild cardiomegaly with prominent epicardial adipose tissue.   Original Report Authenticated By: Gaylyn Rong, M.D.    US Abdomen Limited Ruq  12/04/2012  *RADIOLOGY REPORT*  Clinical Data:  Acute pancreatitis.  Evaluate for gallstones as the etiology.  LIMITED ABDOMINAL ULTRASOUND - RIGHT UPPER QUADRANT  Comparison:  CT abdomen and pelvis earlier same date.  Findings:  Gallbladder:  Numerous shadowing calculi small amount of associated echogenic sludge.  No gallbladder wall thickening or pericholecystic fluid.  Positive sonographic Murphy's sign according to the ultrasound technologist.  Common bile duct:  Mildly dilated up to approximately 10 mm diameter centrally.  Obscured distally by duodenal bowel gas.  No visible common duct stones on the earlier CT.  Liver:  Diffusely increased and coarsened echotexture without focal parenchymal abnormality.  Patent portal vein with hepatopetal flow.  IMPRESSION:  1.  Cholelithiasis and gallbladder sludge.  Positive sonographic Murphy's sign without other sonographic findings for acute cholecystitis. 2.  Mild biliary ductal dilation up to 10 mm centrally; distal duct obscured by overlying bowel gas, but no visible bile duct stones on the earlier CT. 3.  Diffuse hepatic steatosis and/or hepatocellular disease without focal hepatic parenchymal abnormality.                     Original Report Authenticated By: Hulan Saas, M.D.     Review of Systems  Constitutional: Positive for fever, chills and diaphoresis. Negative for weight loss and malaise/fatigue.  HENT: Negative.   Eyes: Negative.   Respiratory: Negative.   Cardiovascular: Positive for chest pain. Negative for palpitations.  Gastrointestinal: Positive for heartburn, nausea and abdominal pain (epigastric). Negative for vomiting, diarrhea, constipation, blood in stool and melena.  Genitourinary: Negative.   Musculoskeletal: Negative.   Skin: Negative.   Neurological: Negative.  Negative for weakness.  Endo/Heme/Allergies: Negative.   Psychiatric/Behavioral: Negative.    Blood pressure 131/76, pulse 76, temperature 97.7 F (36.5 C), temperature source Oral, resp. rate 18, height 5\' 2"  (1.575 m), weight 142.429 kg (314 lb), last menstrual period 10/04/2012, SpO2 93.00%. Physical Exam  Constitutional: She is oriented to person, place, and time. She appears well-developed and well-nourished. No distress.       Morbidly obese, uncomfortable in appearance  HENT:  Head: Normocephalic and atraumatic.  Eyes: Conjunctivae normal and EOM are normal. Pupils are equal, round, and reactive to light. No scleral icterus.  Neck: Normal range of motion. Neck supple. No tracheal deviation present. No thyromegaly present.  Cardiovascular: Normal rate and regular rhythm.   Respiratory: Effort normal and breath sounds normal. No respiratory distress.  GI: Soft. She exhibits no distension and no mass. There is tenderness (moderate to severe epigastric abdominal tenderness.). There is guarding. There is no rebound.  Lymphadenopathy:    She has no cervical adenopathy.  Neurological: She is alert and oriented to person, place, and time.  Skin: Skin is warm and dry.    Assessment/Plan: Acute pancreatitis. Likely gallstone etiology. Findings are with the degree of pain the patient still has. I would continue to monitor  the patient's pancreatic enzymes to ensure these are trending downwards. At this time due to her increased pain with oral intake I suggested to the patient proceeding with an n.p.o. status. She has requested  some ice chips and I feel this is reasonable as long as her symptomatology or pancreatic enzymes do not increase. We did discuss the possible risk of progression of pancreatitis. At this time I do feel she will be a candidate for a cholecystectomy to minimize future episodes of pancreatitis however at this time as I discussed with the patient we need to wait for the pancreatitis to resolve before proceeding to the operating room. I will continue to monitor and follow the patient closely with you. I appreciate the opportunity to assist in her care.  Demetrice Combes C 12/05/2012, 1:23 PM

## 2012-12-05 NOTE — Consult Note (Signed)
Referring Provider: No ref. provider found Primary Care Physician:  No primary provider on file.   Reason for Consultation:  Biliary pancreatitis.  HPI:  Patient is 67 year old Caucasian female who's been experiencing intermittent epigastric pain for the last 3-4 months. Pain was never severe and did not last for more than few minutes or an hour. Pain was not associated with nausea vomiting fever or chills. Since she had lost insurance about a year ago she decided not to seek medical advice. She developed more or less sudden onset of severe epigastric pain radiating to the right and posteriorly after lunch. She felt nauseated and had dry heaving. Pain was unbearable. She therefore came to the emergency room where she was evaluated and felt to have biliary pancreatitis. [place] pelvic CT revealed changes of pancreatitis but no fluid collection. This study was followed by abdominal ultrasound which revealed cholelithiasis and bile duct measured 10 mm. No stones are evident in the bile duct with all of the bile duct was not well seen because of overlying gas. She also noted to have fatty change. She was hospitalized begun on IV fluids and IV PPI. She is on hydromorphone every 4 hours but she states it does not control her pain very well. She denies shortness of breath or chest pain. While she's had good appetite she is been dieting and has lost 20 pounds in the last 3-4 months.  She does not take any NSAIDs. She drinks alcohol very occasionally. Past Medical History  Diagnosis Date  . Thyroid disease   . Obesity   . Diverticulosis   . Periumbilical hernia   . Hypothyroidism    She has never undergone screening for CRC. History of alimentary hypoglycemia. She is hypothyroid  History reviewed. No pertinent past surgical history.  Prior to Admission medications   Medication Sig Start Date End Date Taking? Authorizing Provider  levothyroxine (SYNTHROID, LEVOTHROID) 100 MCG tablet Take 100 mcg by  mouth daily.   Yes Historical Provider, MD    Current Facility-Administered Medications  Medication Dose Route Frequency Provider Last Rate Last Dose  . 0.9 %  sodium chloride infusion   Intravenous Continuous Malissa Hippo, MD 75 mL/hr at 12/05/12 1034    . acetaminophen (TYLENOL) tablet 650 mg  650 mg Oral Q6H PRN Wilson Singer, MD   650 mg at 12/05/12 0815  . cefTRIAXone (ROCEPHIN) 1 g in dextrose 5 % 50 mL IVPB  1 g Intravenous Q24H Malissa Hippo, MD      . heparin injection 5,000 Units  5,000 Units Subcutaneous Q8H Wilson Singer, MD   5,000 Units at 12/05/12 1644  . HYDROmorphone (DILAUDID) injection 1 mg  1 mg Intravenous Q2H PRN Malissa Hippo, MD      . levothyroxine (SYNTHROID, LEVOTHROID) tablet 100 mcg  100 mcg Oral QAC breakfast Wilson Singer, MD   100 mcg at 12/05/12 0816  . ondansetron (ZOFRAN) injection 4 mg  4 mg Intravenous Q4H PRN Vania Rea, MD   4 mg at 12/05/12 1603   Or  . ondansetron (ZOFRAN) tablet 4 mg  4 mg Oral Q4H PRN Vania Rea, MD        Allergies as of 12/04/2012 - Review Complete 12/04/2012  Allergen Reaction Noted  . Codeine Itching 10/10/2012  . Compazine (prochlorperazine edisylate)  10/10/2012  . Prednisone Nausea And Vomiting 12/04/2012    History reviewed. No pertinent family history. Father died of unknown malignancy at age 44. Mother is in good health  at age 12 and lives alone. She has one sister and 3 brothers. One sister and one brother in good health. One brother is diabetic another one is on hemodialysis for end-stage renal disease. Patient is married. Her husband is in the room with her. They have one daughter age 25 who is in good health. 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  History   Social History  . Marital Status: Married    Spouse Name: N/A    Number of Children: N/A  . Years of Education: N/A   Occupational History  . Not on file.    Social History Main Topics  . Smoking status: Never Smoker   . Smokeless tobacco: Not on file  . Alcohol Use: No  . Drug Use: No  . Sexually Active: Not Currently   Other Topics Concern  . Not on file   Social History Narrative  . No narrative on file    Review of Systems: See HPI, otherwise normal ROS  Physical Exam: Temp:  [97.7 F (36.5 C)-100.1 F (37.8 C)] 98.1 F (36.7 C) (01/11 1322) Pulse Rate:  [67-88] 88  (01/11 1322) Resp:  [18-20] 20  (01/11 1322) BP: (107-143)/(53-76) 143/53 mmHg (01/11 1322) SpO2:  [93 %] 93 % (01/11 1322) Last BM Date: 12/03/12 General:  Pleasant morbidly obese Caucasian female who appears to be in pain. Head:  Normocephalic and atraumatic. Eyes:  Sclera clear, no icterus.   Conjunctiva pink. Mouth:  Oropharyngeal mucosa is dry otherwise normal. Dentition in fair condition with few teeth missing.  Neck:  Supple; no masses or thyromegaly. Lungs:  Clear throughout to auscultation.   No wheezes, crackles, or rhonchi.  Heart:  Regular rate and rhythm. Normal S1 and S2. Grade 2/6 systolic ejection murmur heard at LLSB and aortic area Abdomen:  Abdomen is protuberant with umbilicus hernia. Bowel sounds are normal. Abdomen is soft with tenderness across upper abdomen with some guarding. No organomegaly or masses noted. Umbilicus hernia is soft and without tenderness.    Extremities:  Without clubbing or edema.    Lab Results:  Basename 12/05/12 0530 12/04/12 1034  WBC 13.8* 11.1*  HGB 12.4 12.9  HCT 37.9 38.8  PLT 213 231   BMET  Basename 12/05/12 0530 12/04/12 1034  NA 133* 134*  K 3.6 3.7  CL 98 100  CO2 25 24  GLUCOSE 82 136*  BUN 5* 8  CREATININE 0.68 0.66  CALCIUM 8.5 9.2   LFT  Basename 12/05/12 0530  PROT 7.2  ALBUMIN 3.1*  AST 108*  ALT 132*  ALKPHOS 173*  BILITOT 0.6  BILIDIR --  IBILI --   PT/INR No results found for this basename: LABPROT:2,INR:2 in the last 72 hours Hepatitis Panel No results found for  this basename: HEPBSAG,HCVAB,HEPAIGM,HEPBIGM in the last 72 hours  Studies/Results: Ct Abdomen Pelvis W Contrast  12/04/2012  *RADIOLOGY REPORT*  Clinical Data: Abdominal pain.  Nausea and vomiting.  CT ABDOMEN AND PELVIS WITH CONTRAST  Technique:  Multidetector CT imaging of the abdomen and pelvis was performed following the standard protocol during bolus administration of intravenous contrast.  Contrast:  100 ml Omnipaque-300 and oral contrast  Comparison: 05/27/2012  Findings: There appears to be mild swelling of the pancreatic tail with a subtle peripancreatic soft tissue stranding which is new since previous study.  This is suspicious for mild acute pancreatitis.  There is no evidence of pancreatic mass or pancreatic ductal dilatation.  No evidence of pancreatic necrosis or peri pancreatic fluid collections.  Gallbladder is unremarkable, and the there is no evidence of biliary ductal dilatation.  The liver, spleen, pancreas, and kidneys are normal in appearance.  No evidence of hydronephrosis. No soft tissue masses or lymphadenopathy identified within the abdomen or pelvis.  Uterus and adnexal regions are unremarkable.  Diverticulosis is again seen mainly involving the sigmoid colon, however there is no evidence of diverticulitis.  No evidence of abscess.  No evidence of dilated bowel loops.  A small periumbilical hernia is again seen containing only omental fat.  No herniated bowel loops.  IMPRESSION:  1.  Findings suspicious for mild acute pancreatitis, mainly involving the tail.  Suggest correlation with serum amylase and lipase levels. No evidence of peripancreatic fluid collections or other complication. 2.  Diverticulosis.  No radiographic evidence of diverticulitis. 3.  Stable small periumbilical hernia containing only omental fat.   Original Report Authenticated By: Myles Rosenthal, M.D.    Dg Chest Port 1 View  12/04/2012  *RADIOLOGY REPORT*  Clinical Data: Cough.  Congestive heart failure.  PORTABLE  CHEST - 1 VIEW  Comparison: 10/10/2012  Findings: Mild cardiomegaly noted.  Prominent epicardial fat pad appears stable.  The visualized lungs appear clear.  IMPRESSION:  1.  Mild cardiomegaly with prominent epicardial adipose tissue.   Original Report Authenticated By: Gaylyn Rong, M.D.    US Abdomen Limited Ruq  12/04/2012  *RADIOLOGY REPORT*  Clinical Data:  Acute pancreatitis.  Evaluate for gallstones as the etiology.  LIMITED ABDOMINAL ULTRASOUND - RIGHT UPPER QUADRANT  Comparison:  CT abdomen and pelvis earlier same date.  Findings:  Gallbladder:  Numerous shadowing calculi small amount of associated echogenic sludge.  No gallbladder wall thickening or pericholecystic fluid.  Positive sonographic Murphy's sign according to the ultrasound technologist.  Common bile duct:  Mildly dilated up to approximately 10 mm diameter centrally.  Obscured distally by duodenal bowel gas.  No visible common duct stones on the earlier CT.  Liver:  Diffusely increased and coarsened echotexture without focal parenchymal abnormality.  Patent portal vein with hepatopetal flow.  IMPRESSION:  1.  Cholelithiasis and gallbladder sludge.  Positive sonographic Murphy's sign without other sonographic findings for acute cholecystitis. 2.  Mild biliary ductal dilation up to 10 mm centrally; distal duct obscured by overlying bowel gas, but no visible bile duct stones on the earlier CT. 3.  Diffuse hepatic steatosis and/or hepatocellular disease without focal hepatic parenchymal abnormality.                    Original Report Authenticated By: Hulan Saas, M.D.     Assessment; Patient has biliary pancreatitis. She appears to be hemodynamically stable. Her course over the next 24-48 hours will determine her outcome. Her pain is poorly controlled with current interval of narcotic. She will need IV antibiotic for biliary pancreatitis. She will need laparoscopic cholecystectomy and may or may not need ERCP prior to gallbladder  removal. She has already been evaluated by Dr. Tilford Pillar of surgical service.  Recommendations; Patient will need to be NPO except by mouth meds. Will change hydromorphone schedule to every 2 hours as discussed with Dr. Karilyn Cota. Ceftriaxone 1 g IV every 24 hours. Pantoprazole 40 mg IV every 24 hours. Increase IV fluid rate to 150 mL per hour and reevaluate fluid balance in a.m. She'll have comprehensive chemistry panel repeated in a.m. Need for ERCP will depend on her clinical course.   LOS: 1 day  Kevante Lunt U  12/05/2012, 5:33 PM

## 2012-12-06 ENCOUNTER — Observation Stay (HOSPITAL_COMMUNITY): Payer: Self-pay

## 2012-12-06 ENCOUNTER — Encounter (HOSPITAL_COMMUNITY): Payer: Self-pay | Admitting: Internal Medicine

## 2012-12-06 DIAGNOSIS — I4891 Unspecified atrial fibrillation: Secondary | ICD-10-CM | POA: Diagnosis not present

## 2012-12-06 DIAGNOSIS — K810 Acute cholecystitis: Secondary | ICD-10-CM

## 2012-12-06 HISTORY — DX: Unspecified atrial fibrillation: I48.91

## 2012-12-06 HISTORY — DX: Acute cholecystitis: K81.0

## 2012-12-06 LAB — CBC
HCT: 36.2 % (ref 36.0–46.0)
Hemoglobin: 11.6 g/dL — ABNORMAL LOW (ref 12.0–15.0)
MCH: 28.6 pg (ref 26.0–34.0)
MCHC: 32 g/dL (ref 30.0–36.0)
MCV: 89.4 fL (ref 78.0–100.0)
Platelets: 208 10*3/uL (ref 150–400)
RBC: 4.05 MIL/uL (ref 3.87–5.11)
RDW: 14.3 % (ref 11.5–15.5)
WBC: 19 10*3/uL — ABNORMAL HIGH (ref 4.0–10.5)

## 2012-12-06 LAB — URINE CULTURE: Colony Count: 15000

## 2012-12-06 LAB — COMPREHENSIVE METABOLIC PANEL WITH GFR
ALT: 77 U/L — ABNORMAL HIGH (ref 0–35)
AST: 36 U/L (ref 0–37)
Albumin: 2.7 g/dL — ABNORMAL LOW (ref 3.5–5.2)
Alkaline Phosphatase: 139 U/L — ABNORMAL HIGH (ref 39–117)
BUN: 5 mg/dL — ABNORMAL LOW (ref 6–23)
CO2: 26 meq/L (ref 19–32)
Calcium: 8.5 mg/dL (ref 8.4–10.5)
Chloride: 100 meq/L (ref 96–112)
Creatinine, Ser: 0.71 mg/dL (ref 0.50–1.10)
GFR calc Af Amer: >90 mL/min
GFR calc non Af Amer: 89 mL/min — ABNORMAL LOW
Glucose, Bld: 93 mg/dL (ref 70–99)
Potassium: 3.4 meq/L — ABNORMAL LOW (ref 3.5–5.1)
Sodium: 133 meq/L — ABNORMAL LOW (ref 135–145)
Total Bilirubin: 0.4 mg/dL (ref 0.3–1.2)
Total Protein: 6.8 g/dL (ref 6.0–8.3)

## 2012-12-06 LAB — LIPASE, BLOOD: Lipase: 47 U/L (ref 11–59)

## 2012-12-06 LAB — AMYLASE: Amylase: 63 U/L (ref 0–105)

## 2012-12-06 MED ORDER — POTASSIUM CHLORIDE IN NACL 20-0.9 MEQ/L-% IV SOLN
INTRAVENOUS | Status: DC
  Administered 2012-12-06: 14:00:00 via INTRAVENOUS

## 2012-12-06 MED ORDER — KCL IN DEXTROSE-NACL 20-5-0.9 MEQ/L-%-% IV SOLN
INTRAVENOUS | Status: DC
  Administered 2012-12-06: 19:00:00 via INTRAVENOUS

## 2012-12-06 MED ORDER — DILTIAZEM HCL 25 MG/5ML IV SOLN
INTRAVENOUS | Status: AC
  Administered 2012-12-06: 10:00:00
  Filled 2012-12-06: qty 5

## 2012-12-06 MED ORDER — BISACODYL 10 MG RE SUPP: 10.0000 mg | Freq: Every day | RECTAL | Status: DC | PRN

## 2012-12-06 MED ORDER — DILTIAZEM HCL 25 MG/5ML IV SOLN
10.0000 mg | INTRAVENOUS | Status: AC
  Administered 2012-12-06: 10 mg via INTRAVENOUS
  Filled 2012-12-06: qty 5

## 2012-12-06 MED ORDER — SODIUM CHLORIDE 0.9 % IV SOLN: INTRAVENOUS | Status: DC

## 2012-12-06 MED ORDER — FLEET ENEMA 7-19 GM/118ML RE ENEM: 1.0000 | ENEMA | Freq: Every day | RECTAL | Status: DC | PRN

## 2012-12-06 MED ORDER — DILTIAZEM HCL 60 MG PO TABS
60.0000 mg | ORAL_TABLET | Freq: Three times a day (TID) | ORAL | Status: DC
  Administered 2012-12-06 – 2012-12-07 (×4): 60 mg via ORAL
  Filled 2012-12-06 (×5): qty 1

## 2012-12-06 NOTE — Progress Notes (Signed)
  Subjective: Pain improved. Now back on clears and tolerating well.  No fever or chills.  Objective: Vital signs in last 24 hours: Temp:  [97.7 F (36.5 C)-99.1 F (37.3 C)] 98.2 F (36.8 C) (01/12 1532) Pulse Rate:  [80-83] 80  (01/12 1532) Resp:  [18-20] 20  (01/12 1532) BP: (127-167)/(72-79) 167/79 mmHg (01/12 1532) SpO2:  [91 %-95 %] 95 % (01/12 1532) Last BM Date: 12/03/12  Intake/Output from previous day: 01/11 0701 - 01/12 0700 In: 3019.3 [P.O.:240; I.V.:2711.3; IV Piggyback:68] Out: 250 [Urine:250] Intake/Output this shift: Total I/O In: 427 [P.O.:240; I.V.:125; IV Piggyback:62] Out: 450 [Urine:450]  General appearance: alert and no distress GI: soft, moderate epigastric pain.  No peritoneal signs.    Lab Results:   Basename 12/06/12 0610 12/05/12 0530  WBC 19.0* 13.8*  HGB 11.6* 12.4  HCT 36.2 37.9  PLT 208 213   BMET  Basename 12/06/12 0610 12/05/12 0530  NA 133* 133*  K 3.4* 3.6  CL 100 98  CO2 26 25  GLUCOSE 93 82  BUN 5* 5*  CREATININE 0.71 0.68  CALCIUM 8.5 8.5   PT/INR No results found for this basename: LABPROT:2,INR:2 in the last 72 hours ABG No results found for this basename: PHART:2,PCO2:2,PO2:2,HCO3:2 in the last 72 hours  Studies/Results: Dg Chest Port 1 View  12/06/2012  *RADIOLOGY REPORT*  Clinical Data: Atrial fibrillation.  PORTABLE CHEST - 1 VIEW  Comparison: Chest x-ray 12/04/2012.  Findings: Lung volumes are slightly low.  No acute consolidative air space disease.  No pleural effusions.  Pulmonary vasculature is normal.  Heart size is upper limits of normal.  There is slight obscuration of the apex of the heart, compatible with prominent epicardial fat noted on prior CT of the abdomen pelvis 12/04/2012. Upper mediastinal contours are within normal limits.  IMPRESSION: 1.  Low lung volumes without radiographic evidence of acute cardiopulmonary disease.   Original Report Authenticated By: Trudie Reed, M.D.      Anti-infectives: Anti-infectives     Start     Dose/Rate Route Frequency Ordered Stop   12/05/12 1700   cefTRIAXone (ROCEPHIN) 1 g in dextrose 5 % 50 mL IVPB        1 g 100 mL/hr over 30 Minutes Intravenous Every 24 hours 12/05/12 1633            Assessment/Plan: s/p * No surgery found * Acute pancreatitis.  Gallstone etiology.  Advance diet as tolerate. If tolerates regular diet tomorrow will consider chole on Tuesday.  Surgical options discussed with the patient.  Patient not definite on proceeding with surgery on this admission.  LOS: 2 days    Kensley Valladares C 12/06/2012

## 2012-12-06 NOTE — Progress Notes (Addendum)
Subjective; Patient denies chest pain or shortness of breath. She says her abdominal pain is about the same but pain medication is helping. She feels nauseated but has not vomited. Objective; BP 137/76  Pulse 83  Temp 97.7 F (36.5 C) (Oral)  Resp 19  Ht 5\' 2"  (1.575 m)  Wt 314 lb (142.429 kg)  BMI 57.43 kg/m2  SpO2 94%  LMP 10/04/2012 Patient is alert and appears to be comfortable. Cardiac exam with irregular rhythm, normal S1 and S2 and faint SEM at LLSB. Lungs are clear to auscultation. Abdomen is obese. Bowel sounds are normal. Umbilicus hernia soft without tenderness. Moderate tenderness across upper abdomen but primarily centered in epigastric region. Lab data; WBC 19.0, H&H 11.6 and 36.2 and platelet count 208K. Serum amylase 63 and lipase 47 Serum sodium 133, potassium 3.4 right 100 CO2 26, glucose 93, BUN 5, creatinine 0.71. Serum calcium 8.5 Bilirubin oh 0.4, AP 139,AST 36, ALT 77, serum albumin 2.7 Urine output?  Assessment; #1. Biliary pancreatitis. Progressive leukocytosis with transaminases are down. Serum calcium is normal and renal function is well-preserved. Most primary suggest her pancreatitis is not getting worse. She may be behind on fluids if she's only been getting 75 mL per hour. Rate was not changed as ordered yesterday. #2. Tachyarrhythmia. Suspect she is in A. Fib. #3. Mild hypokalemia. Recommendations; Stat EKG. IV fluid rate to 250 mL per hour x4 hours and then at 150 mL per hour. KCl in IV fluids Continue NPO status. Will check with radiology if MRCP could be done with our machine.

## 2012-12-06 NOTE — Progress Notes (Signed)
Subjective:  Called with the report that the patient had developed atrial fibrillation with rapid ventricular rate of 140s to 150s. Currently, she has no complaints of chest pain or palpitations. She has no abdominal pain currently. Her only complaint is of a global headache which he says is likely from hunger.   Objective: Vital signs in last 24 hours: Filed Vitals:   12/05/12 1040 12/05/12 1322 12/05/12 2115 12/06/12 0615  BP:  143/53 127/73 138/72  Pulse:  88 81 80  Temp: 97.7 F (36.5 C) 98.1 F (36.7 C) 99.1 F (37.3 C) 97.7 F (36.5 C)  TempSrc:  Oral Oral Oral  Resp:  20 18 19   Height:      Weight:      SpO2:  93% 93% 91%    Intake/Output Summary (Last 24 hours) at 12/06/12 4259 Last data filed at 12/06/12 0544  Gross per 24 hour  Intake 3019.25 ml  Output    250 ml  Net 2769.25 ml    Weight change:   General: Morbidly obese Caucasian woman laying in bed, in no acute distress. Heart: Irregular, irregular, with tachycardia. Lungs: Clear anteriorly with decreased breath sounds in the bases. Abdomen: Positive bowel sounds, morbidly obese, umbilical hernia present, reducible. Mildly tender in the epigastrium without rebound or distention. No obvious masses palpated. Extremities: Pedal pulses thready. No pedal edema. Neurologic: She is alert and oriented x3. Cranial nerves II through XII are intact.    Lab Results: Basic Metabolic Panel:  Basename 12/06/12 0610 12/05/12 0530  NA 133* 133*  K 3.4* 3.6  CL 100 98  CO2 26 25  GLUCOSE 93 82  BUN 5* 5*  CREATININE 0.71 0.68  CALCIUM 8.5 8.5  MG -- --  PHOS -- --   Liver Function Tests:  [facility] 12/06/12 0610 12/05/12 0530  AST 36 108*  ALT 77* 132*  ALKPHOS 139* 173*  BILITOT 0.4 0.6  PROT 6.8 7.2  ALBUMIN 2.7* 3.1*    Basename 12/06/12 0610 12/05/12 0531 12/04/12 1034  LIPASE 47 -- >3000*  AMYLASE 63 516* --   No results found for this basename: AMMONIA:2 in the last 72 hours CBC:  Basename  12/06/12 0610 12/05/12 0530 12/04/12 1034  WBC 19.0* 13.8* --  NEUTROABS -- -- 8.8*  HGB 11.6* 12.4 --  HCT 36.2 37.9 --  MCV 89.4 89.0 --  PLT 208 213 --   Cardiac Enzymes:  Basename 12/04/12 1034  CKTOTAL --  CKMB --  CKMBINDEX --  TROPONINI <0.30   BNP: No results found for this basename: PROBNP:3 in the last 72 hours D-Dimer: No results found for this basename: DDIMER:2 in the last 72 hours CBG: No results found for this basename: GLUCAP:6 in the last 72 hours Hemoglobin A1C:  Basename 12/04/12 1034  HGBA1C 5.9*   Fasting Lipid Panel:  Basename 12/05/12 0531  CHOL 190  HDL 70  LDLCALC 89  TRIG 157*  CHOLHDL 2.7  LDLDIRECT --   Thyroid Function Tests:  Basename 12/04/12 1034  TSH 3.539  T4TOTAL --  FREET4 --  T3FREE --  THYROIDAB --   Anemia Panel: No results found for this basename: VITAMINB12,FOLATE,FERRITIN,TIBC,IRON,RETICCTPCT in the last 72 hours Coagulation: No results found for this basename: LABPROT:2,INR:2 in the last 72 hours Urine Drug Screen: Drugs of Abuse  No results found for this basename: labopia, cocainscrnur, labbenz, amphetmu, thcu, labbarb    Alcohol Level: No results found for this basename: ETH:2 in the last 72 hours Urinalysis:  [employer]  12/04/12 1037  COLORURINE AMBER*  LABSPEC 1.020  PHURINE 6.5  GLUCOSEU NEGATIVE  HGBUR SMALL*  BILIRUBINUR MODERATE*  KETONESUR NEGATIVE  PROTEINUR NEGATIVE  UROBILINOGEN 1.0  NITRITE NEGATIVE  LEUKOCYTESUR NEGATIVE   Misc. Labs:   Micro: Recent Results (from the past 240 hour(s))  URINE CULTURE     Status: Normal (Preliminary result)   Collection Time   12/04/12 10:37 AM      Component Value Range Status Comment   Specimen Description URINE, RANDOM   Final    Special Requests NONE   Final    Culture  Setup Time 12/04/2012 22:18   Final    Colony Count 15,000 COLONIES/ML   Final    Culture GRAM NEGATIVE RODS   Final    Report Status PENDING   Incomplete      Studies/Results: Ct Abdomen Pelvis W Contrast  12/04/2012  *RADIOLOGY REPORT*  Clinical Data: Abdominal pain.  Nausea and vomiting.  CT ABDOMEN AND PELVIS WITH CONTRAST  Technique:  Multidetector CT imaging of the abdomen and pelvis was performed following the standard protocol during bolus administration of intravenous contrast.  Contrast:  100 ml Omnipaque-300 and oral contrast  Comparison: 05/27/2012  Findings: There appears to be mild swelling of the pancreatic tail with a subtle peripancreatic soft tissue stranding which is new since previous study.  This is suspicious for mild acute pancreatitis.  There is no evidence of pancreatic mass or pancreatic ductal dilatation. No evidence of pancreatic necrosis or peri pancreatic fluid collections.  Gallbladder is unremarkable, and the there is no evidence of biliary ductal dilatation.  The liver, spleen, pancreas, and kidneys are normal in appearance.  No evidence of hydronephrosis. No soft tissue masses or lymphadenopathy identified within the abdomen or pelvis.  Uterus and adnexal regions are unremarkable.  Diverticulosis is again seen mainly involving the sigmoid colon, however there is no evidence of diverticulitis.  No evidence of abscess.  No evidence of dilated bowel loops.  A small periumbilical hernia is again seen containing only omental fat.  No herniated bowel loops.  IMPRESSION:  1.  Findings suspicious for mild acute pancreatitis, mainly involving the tail.  Suggest correlation with serum amylase and lipase levels. No evidence of peripancreatic fluid collections or other complication. 2.  Diverticulosis.  No radiographic evidence of diverticulitis. 3.  Stable small periumbilical hernia containing only omental fat.   Original Report Authenticated By: Myles Rosenthal, M.D.    Dg Chest Port 1 View  12/04/2012  *RADIOLOGY REPORT*  Clinical Data: Cough.  Congestive heart failure.  PORTABLE CHEST - 1 VIEW  Comparison: 10/10/2012  Findings: Mild  cardiomegaly noted.  Prominent epicardial fat pad appears stable.  The visualized lungs appear clear.  IMPRESSION:  1.  Mild cardiomegaly with prominent epicardial adipose tissue.   Original Report Authenticated By: Gaylyn Rong, M.D.    US Abdomen Limited Ruq  12/04/2012  *RADIOLOGY REPORT*  Clinical Data:  Acute pancreatitis.  Evaluate for gallstones as the etiology.  LIMITED ABDOMINAL ULTRASOUND - RIGHT UPPER QUADRANT  Comparison:  CT abdomen and pelvis earlier same date.  Findings:  Gallbladder:  Numerous shadowing calculi small amount of associated echogenic sludge.  No gallbladder wall thickening or pericholecystic fluid.  Positive sonographic Murphy's sign according to the ultrasound technologist.  Common bile duct:  Mildly dilated up to approximately 10 mm diameter centrally.  Obscured distally by duodenal bowel gas.  No visible common duct stones on the earlier CT.  Liver:  Diffusely increased and coarsened echotexture  without focal parenchymal abnormality.  Patent portal vein with hepatopetal flow.  IMPRESSION:  1.  Cholelithiasis and gallbladder sludge.  Positive sonographic Murphy's sign without other sonographic findings for acute cholecystitis. 2.  Mild biliary ductal dilation up to 10 mm centrally; distal duct obscured by overlying bowel gas, but no visible bile duct stones on the earlier CT. 3.  Diffuse hepatic steatosis and/or hepatocellular disease without focal hepatic parenchymal abnormality.                    Original Report Authenticated By: Hulan Saas, M.D.     Medications:  Scheduled:   . cefTRIAXone (ROCEPHIN) IVPB 1 gram/50 mL D5W  1 g Intravenous Q24H  . diltiazem      . diltiazem  10 mg Intravenous STAT  . heparin  5,000 Units Subcutaneous Q8H  . levothyroxine  100 mcg Oral QAC breakfast  . pantoprazole (PROTONIX) IV  40 mg Intravenous Q24H   Continuous:   . 0.9 % NaCl with KCl 20 mEq / L     WGN:[id number], HYDROmorphone (DILAUDID) injection, ondansetron  (ZOFRAN) IV, ondansetron  Assessment: Principal Problem:  *Pancreatitis, acute Active Problems:  Hypothyroidism  Obesity (BMI 35.0-39.9 without comorbidity)  Acute cholecystitis  Atrial fibrillation with RVR    1. Acute pancreatitis/acute cholecystitis-biliary pancreatitis. Her amylase and lipase have normalized. Her liver transaminases are trending downward towards normal. Dr. Karilyn Cota and Dr. Leticia Penna his assessment and recommendations noted and appreciated. We'll continue IV fluid hydration, IV analgesics, PPI, and emperic ceftriaxone.  New onset atrial fibrillation with rapid ventricular response. The patient has no history of atrial fibrillation. However, she tells me that her brother has atrial fibrillation. She does not appear to be dehydrated at this point as her BUN is less than 10.  Leukocytosis. Etiology uncertain. Query stress reaction. Her acute pancreatitis/acute cholecystitis, per enzymes is improving. Her urinalysis reveals no signs of infection. We'll check a chest x-ray for further evaluation.  Hypothyroidism. Her TSH is within normal limits at 3.5. Continue Synthroid.  Hypokalemia. This may be secondary to her IV fluids.  Mild headache. The patient says this is likely due to hunger although she does have a history of migraines.  Plan:  1. IV fluids titrated up as recommended by Dr. Karilyn Cota. 2. IV Cardizem at 10 mg x1 now. If her heart rate responds appropriately, then will start oral Cardizem every 8 hours. If her heart rate remains uncontrolled, will transfer her to step down on a diltiazem drip. Continue subcutaneous heparin. Hold off on additional anticoagulation and antiplatelet therapy given the potential for an operation soon. 3. Will change IV fluids to add dextrose and potassium when the 4 hour increase in the other IV fluids has been discontinued. 4. Continue analgesics as ordered. 5. Consider adding sips of clear liquids which may give patient some calorie  source if okay with GI and surgery. 6. Order a chest x-ray for evaluation. 7. Order 2-D echocardiogram tomorrow for evaluation.   LOS: 2 days   Tatyana Biber 12/06/2012, 9:22 AM

## 2012-12-07 ENCOUNTER — Observation Stay (HOSPITAL_COMMUNITY): Payer: Self-pay

## 2012-12-07 DIAGNOSIS — I517 Cardiomegaly: Secondary | ICD-10-CM

## 2012-12-07 LAB — COMPREHENSIVE METABOLIC PANEL
AST: 18 U/L (ref 0–37)
CO2: 26 mEq/L (ref 19–32)
Calcium: 8.7 mg/dL (ref 8.4–10.5)
Creatinine, Ser: 0.65 mg/dL (ref 0.50–1.10)
GFR calc non Af Amer: >90 mL/min (ref >90)
Sodium: 134 mEq/L — ABNORMAL LOW (ref 135–145)
Total Protein: 6.4 g/dL (ref 6.0–8.3)

## 2012-12-07 LAB — CBC
MCH: 28.5 pg (ref 26.0–34.0)
MCHC: 31.9 g/dL (ref 30.0–36.0)
MCV: 89.5 fL (ref 78.0–100.0)
Platelets: 206 10*3/uL (ref 150–400)
RBC: 3.61 MIL/uL — ABNORMAL LOW (ref 3.87–5.11)
RDW: 14.4 % (ref 11.5–15.5)

## 2012-12-07 MED ORDER — HYDROCORTISONE 1 % EX CREA
TOPICAL_CREAM | Freq: Two times a day (BID) | CUTANEOUS | Status: DC
  Administered 2012-12-07: 1 via TOPICAL
  Filled 2012-12-07: qty 1.5

## 2012-12-07 MED ORDER — ONDANSETRON HCL 4 MG PO TABS: 4.0000 mg | ORAL_TABLET | ORAL | Status: DC | PRN

## 2012-12-07 MED ORDER — OMEPRAZOLE 20 MG PO CPDR: 20.0000 mg | DELAYED_RELEASE_CAPSULE | Freq: Every day | ORAL | Status: DC

## 2012-12-07 MED ORDER — DILTIAZEM HCL 120 MG PO TABS: 120.0000 mg | ORAL_TABLET | Freq: Every day | ORAL | Status: DC

## 2012-12-07 MED ORDER — ASPIRIN 81 MG PO TBEC: 81.0000 mg | DELAYED_RELEASE_TABLET | Freq: Every day | ORAL | Status: DC

## 2012-12-07 MED ORDER — HYDROCORTISONE 1 % EX CREA: TOPICAL_CREAM | Freq: Two times a day (BID) | CUTANEOUS | Status: DC | PRN

## 2012-12-07 MED ORDER — HYDROCODONE-ACETAMINOPHEN 5-500 MG PO TABS: 1.0000 | ORAL_TABLET | Freq: Four times a day (QID) | ORAL | Status: DC | PRN

## 2012-12-07 NOTE — Progress Notes (Signed)
  Subjective: Patient undergoing a echocardiogram upon entering the room. Patient without complaints of abdominal symptomatology. Tolerating a diet. No increased abdominal pain. Patient expresses desire to go home.  Objective: Vital signs in last 24 hours: Temp:  [98.2 F (36.8 C)-99 F (37.2 C)] 98.3 F (36.8 C) (01/13 0600) Pulse Rate:  [65-90] 65  (01/13 0600) Resp:  [18-20] 18  (01/13 0600) BP: (120-167)/(70-79) 120/72 mmHg (01/13 0600) SpO2:  [92 %-95 %] 92 % (01/13 0600) Last BM Date: 12/04/12  Intake/Output from previous day: 01/12 0701 - 01/13 0700 In: 3134.8 [P.O.:1700; I.V.:1368.8; IV Piggyback:66] Out: 1625 [Urine:1625] Intake/Output this shift: Total I/O In: 120 [P.O.:120] Out: -   General appearance: alert and no distress GI: Soft, obese, and mild expected epigastric tenderness. No diffuse peritoneal signs.  Lab Results:   [facility] 12/07/12 0538 12/06/12 0610  WBC 14.6* 19.0*  HGB 10.3* 11.6*  HCT 32.3* 36.2  PLT 206 208   BMET  Basename 12/07/12 0538 12/06/12 0610  NA 134* 133*  K 3.6 3.4*  CL 101 100  CO2 26 26  GLUCOSE 133* 93  BUN 4* 5*  CREATININE 0.65 0.71  CALCIUM 8.7 8.5   PT/INR No results found for this basename: LABPROT:2,INR:2 in the last 72 hours ABG No results found for this basename: PHART:2,PCO2:2,PO2:2,HCO3:2 in the last 72 hours  Studies/Results: Dg Chest Port 1 View  12/06/2012  *RADIOLOGY REPORT*  Clinical Data: Atrial fibrillation.  PORTABLE CHEST - 1 VIEW  Comparison: Chest x-ray 12/04/2012.  Findings: Lung volumes are slightly low.  No acute consolidative air space disease.  No pleural effusions.  Pulmonary vasculature is normal.  Heart size is upper limits of normal.  There is slight obscuration of the apex of the heart, compatible with prominent epicardial fat noted on prior CT of the abdomen pelvis 12/04/2012. Upper mediastinal contours are within normal limits.  IMPRESSION: 1.  Low lung volumes without radiographic  evidence of acute cardiopulmonary disease.   Original Report Authenticated By: Trudie Reed, M.D.     Anti-infectives: Anti-infectives     Start     Dose/Rate Route Frequency Ordered Stop   12/05/12 1700   cefTRIAXone (ROCEPHIN) 1 g in dextrose 5 % 50 mL IVPB        1 g 100 mL/hr over 30 Minutes Intravenous Every 24 hours 12/05/12 1633            Assessment/Plan: s/p * No surgery found * Acute pancreatitis. Suspect gallstone etiology. At this point patient is tolerating a diet and I feel is reasonable to allow patient be discharged depending on patient's cardiac workup. I have again discussed with the patient surgical options and we will further discuss her options and scheduling as an outpatient. Don't suspect see the patient as an outpatient for further discussion and scheduling for the cholecystectomy.  LOS: 3 days    Lauren [name] C 12/07/2012

## 2012-12-07 NOTE — Progress Notes (Signed)
Subjective; Patient feels better. She says her pain is now well controlled with medication and intensity has decreased. She is not having any difficulty with clear liquids. She denies chest pain or shortness of breath. She is passing flatus. She has taken pain medication 4 times in the last 24-hours. Objective; BP 120/72  Pulse 65  Temp 98.3 F (36.8 C) (Oral)  Resp 18  Ht 5\' 2"  (1.575 m)  Wt 314 lb (142.429 kg)  BMI 57.43 kg/m2  SpO2 92%  LMP 10/04/2012 Patient is alert and appears to be in no acute distress. Cardiac exam with regular rhythm normal S1 and S2. No murmur noted today. Lungs clear to auscultation. Abdomen is obese with normal bowel sounds. She has mild tenderness at epigastric region. Lab data; WBC 14.6, H&H 10.3 and 32.3 and platelet count 206K. BUN 4, creatinine 0.65, serum sodium 134, potassium 3.6, chloride 106, CO2 26 and glucose 133. Bilirubin oh 0.3, AP 123, AST 18, ALT 49 and albumin is 2.4. Urine output 1625 ML/24 hours Assessment; Biliary pancreatitis. Continued improvement in transaminases. AST is normal and ALT is down to 49. She is tolerating clear liquids. Since she has dilated bile duct, will proceed with MRCP to find out if she will need ERCP or not prior to cholecystectomy. Timing of cholecystectomy would be determined by Dr. Leticia Penna. Recommendations; MRCP today if possible.

## 2012-12-07 NOTE — Progress Notes (Signed)
UR Chart Review Completed  

## 2012-12-07 NOTE — Progress Notes (Signed)
*  PRELIMINARY RESULTS* Echocardiogram 2D Echocardiogram has been performed.  Lauren [name] 12/07/2012, 12:05 PM

## 2012-12-07 NOTE — Care Management Note (Signed)
    Page 1 of 1   12/07/2012     3:10:51 PM   CARE MANAGEMENT NOTE 12/07/2012  Patient:  Lauren [name], Lauren [name]   Account Number:  [id number]  Date Initiated:  12/07/2012  Documentation initiated by:  Rosemary Holms  Subjective/Objective Assessment:   Pt from home where she lives with spouse. Self Pay and would like to speak with Lubertha [employer]. Needs assistance with meds     Action/Plan:   Anticipated DC Date:  12/07/2012   Anticipated DC Plan:  HOME/SELF CARE  In-house referral  Financial Counselor      DC Planning Services  CM consult  MATCH Program      Choice offered to / List presented to:             Status of service:  Completed, signed off Medicare Important Message given?   (If response is "NO", the following Medicare IM given date fields will be blank) Date Medicare IM given:   Date Additional Medicare IM given:    Discharge Disposition:  HOME/SELF CARE  Per UR Regulation:    If discussed at Long Length of Stay Meetings, dates discussed:    Comments:  12/07/12 1500 Beck Cofer Leanord Hawking RN [facility] CM MATCH voucher given to pt, rules explained, participating Pharmacy list given.

## 2012-12-08 NOTE — Discharge Summary (Signed)
Physician Discharge Summary  Lauren [name] BJY:[id number] DOB: 1946-08-14 DOA: 12/04/2012  PCP: No primary provider on file.  Admit date: 12/04/2012 Discharge date: 12/07/2012  Time spent: greater than 30 minutes  Recommendations for Outpatient Follow-up:  1. The patient was advised to call Dr. Dian Situ for followup of the elective cholecystectomy. 2. An appointment was made for the patient to followup with cardiology for further management of paroxysmal atrial fibrillation.  Discharge Diagnoses:  1. Acute biliary pancreatitis. 2. Cholelithiasis with probable cholecystitis. 3. New-onset paroxysmal atrial fibrillation with rapid ventricular response. The patient's rhythm returned to normal sinus rhythm. 4. Morbid obesity. 5. Hypothyroidism.  Discharge Condition: Improved.  Diet recommendation: Low-fat.  Filed Weights   12/04/12 1519  Weight: 142.429 kg (314 lb)    History of present illness:  The patient is a morbidly obese woman with a history significant for hypothyroidism, who presented to the emergency department on 12/04/2012 with a chief complaint of severe abdominal pain and nausea. In the emergency department, she was afebrile and moderately hypertensive. Her lab data were significant for a lipase of greater than 3000, AST of 247,  ALT of 175, and total bilirubin of 2.2. She was admitted for further evaluation and management.  Hospital Course:   1. Acute pancreatitis/acute cholecystitis-biliary pancreatitis.  The patient was made to be n.p.o for bowel rest. IV fluids were started for hydration. IV Protonix was given empirically. Intravenous analgesics were ordered for pain. Intravenous antiemetics were ordered as needed for nausea. For further evaluation, a CT of her abdomen was ordered. It revealed changes of mild pancreatitis. An ultrasound was ordered following the CT scan results. The ultrasound revealed hepatic steatosis, mild biliary ductal dilatation, and cholelithiasis  without sonographic signs of cholecystitis but with a positive Murphy sign. It was therefore assumed that she had biliary pancreatitis. General surgery and gastroneurology was consulted. Dr. Leticia Penna evaluated the patient and preferred waiting for the acute pancreatitis to resolve before proceeding with a cholecystectomy. Dr. Karilyn Cota evaluated the patient and made some changes with her pain medication and increased her IV fluids. He started Rocephin empirically. Per his assessment, the need for an ERCP would depend on her clinical course. Later, he decided to order an MRCP, however, due to the patient's body habitus, it was impossible to do. Eventually, the patient's pain subsided and her nausea/vomiting completely resolve. Her liver transaminases and lipase completely normalized. Her diet was advanced which she tolerated well. Dr. Leticia Penna reevaluated her and per his conversation with her, the cholecystectomy could be performed later and electively. Therefore, she was discharged to home in improved and stable condition. She was instructed to eat low-fat diet. She will followup with Dr. Leticia Penna in his office. Dr. Karilyn Cota recommended an intraoperative cholangiogram.   New onset atrial fibrillation with rapid ventricular response. The patient's heart rate up abruptly increased to the 140s to 150s during hospitalization. Her EKG revealed atrial fibrillation with a rapid rate. She was given IV diltiazem. Shortly thereafter, her heart rate normalized, but she remained in atrial fibrillation. She was also restarted on oral diltiazem. The following morning, her rhythm returned to normal sinus. Her heart rate was controlled. She had no prior history of atrial fibrillation, however, she stated that her brother had atrial fibrillation. A 2-D echocardiogram was ordered. It revealed no remarkable valvular abnormalities. Her ejection fraction was 55-60%. There was no regional wall motion abnormality. In addition to diltiazem, she  was discharged on aspirin 81 mg daily. She was instructed to stop aspirin 1  week prior to the cholecystectomy. An appointment was made for her to followup with cardiology.  Leukocytosis. Her leukocytosis was likely secondary to the acute pancreatitis/acute cholecystitis. Her urinalysis was not indicative of infection. The urine culture grew out and significant colonies of Klebsiella. Her chest x-ray revealed no acute findings. She was given Rocephin for several days. Antibiotic therapy was discontinued upon discharge. Her white blood cell count did decrease from 19.0-14.6  at the time of discharge. She remained afebrile.   Hypothyroidism. Her TSH was within normal limits at 3.5. Synthroid was continued.   Hypokalemia.  She was supplemented and repleted with potassium chloride.      Procedures: 2-D echocardiogram on 12/07/2012:Study Conclusions  - Left ventricle: The cavity size was normal. Borderline LVH present. Systolic function was normal. The estimated ejection fraction was in the range of 55% to 60%. Wall motion was normal; there were no regional wall motion abnormalities. - Aortic valve: Mildly calcified annulus. Trileaflet. - Left atrium: The atrium was mildly dilated. - Atrial septum: No defect or patent foramen ovale was identified. Transthoracic echocardiography. M-mode, complete 2D, spectral Doppler, and color Doppler. Height: Height: 157.5cm. Height: 62in. Weight: Weight: 142.4kg. Weight: 313.3lb. Body mass index: BMI: 57.4kg/m^2. Body surface area: BSA: 2.52m^2. Patient status: Inpatient. Location: Bedside.     Consultations:  General surgeon, Dr. Dian Situ and gastroenterologist, Dr. Karilyn Cota.  Discharge Exam: Filed Vitals:   12/06/12 1532 12/06/12 2139 12/07/12 0600 12/07/12 1400  BP: 167/79 129/70 120/72 121/58  Pulse: 80 90 65 64  Temp: 98.2 F (36.8 C) 99 F (37.2 C) 98.3 F (36.8 C) 98 F (36.7 C)  TempSrc: Oral Oral Oral Oral  Resp: 20 19 18 18   Height:       Weight:      SpO2: 95% 92% 92% 95%    General: Morbidly obese Caucasian woman sitting up in bed, in no acute distress. Cardiovascular: S1, S2, no murmurs rubs or gallops. Respiratory: Clear to auscultation bilaterally. Abdomen: Morbidly obese, positive bowel sounds, mildly tender epigastrium, no rebound, no guarding, no masses.  Discharge Instructions  Discharge Orders    Future Appointments: Provider: Department: Dept Phone: Center:   01/07/2013 1:15 PM Kathlen Brunswick, MD [facility] [phone] [email or web address]     Future Orders Please Complete By Expires   Diet - low sodium heart healthy      Increase activity slowly      Discharge instructions      Comments:   FOLLOW LOW FAT DIET. NO FRIED OR GREASY FOODS!!  STOP ASPIRIN 1 WEEK PRIOR TO YOUR SURGERY.       Medication List     As of 12/08/2012  7:08 PM    TAKE these medications         aspirin 81 MG EC tablet   Take 1 tablet (81 mg total) by mouth daily. Swallow whole.      diltiazem 120 MG tablet   Commonly known as: CARDIZEM   Take 1 tablet (120 mg total) by mouth daily. Take, starting tomorrow. Medication for your heart.      HYDROcodone-acetaminophen 5-500 MG per tablet   Commonly known as: VICODIN   Take 1 tablet by mouth every 6 (six) hours as needed for pain.      hydrocortisone cream 1 %   Apply topically 2 (two) times daily as needed. Itchy rash.      levothyroxine 100 MCG tablet   Commonly known as: SYNTHROID, LEVOTHROID   Take 100 mcg by  mouth daily.      omeprazole 20 MG capsule   Commonly known as: PRILOSEC   Take 1 capsule (20 mg total) by mouth daily. For acid reflux.      ondansetron 4 MG tablet   Commonly known as: ZOFRAN   Take 1 tablet (4 mg total) by mouth every 4 (four) hours as needed for nausea.           Follow-up Information    Follow up with Onaway Bing, MD. On 01/07/2013. (Your appointment is at 1:15 on 01/07/13. For  cardiology follow up.)     Contact information:   [address] [place] [address] [phone]       Follow up with Fabio Bering, MD. Schedule an appointment as soon as possible for a visit in 1 week.   Contact information:   [address] [place] [address] [phone]           The results of significant diagnostics from this hospitalization (including imaging, microbiology, ancillary and laboratory) are listed below for reference.    Significant Diagnostic Studies: Ct Abdomen Pelvis W Contrast  12/04/2012  *RADIOLOGY REPORT*  Clinical Data: Abdominal pain.  Nausea and vomiting.  CT ABDOMEN AND PELVIS WITH CONTRAST  Technique:  Multidetector CT imaging of the abdomen and pelvis was performed following the standard protocol during bolus administration of intravenous contrast.  Contrast:  100 ml Omnipaque-300 and oral contrast  Comparison: 05/27/2012  Findings: There appears to be mild swelling of the pancreatic tail with a subtle peripancreatic soft tissue stranding which is new since previous study.  This is suspicious for mild acute pancreatitis.  There is no evidence of pancreatic mass or pancreatic ductal dilatation. No evidence of pancreatic necrosis or peri pancreatic fluid collections.  Gallbladder is unremarkable, and the there is no evidence of biliary ductal dilatation.  The liver, spleen, pancreas, and kidneys are normal in appearance.  No evidence of hydronephrosis. No soft tissue masses or lymphadenopathy identified within the abdomen or pelvis.  Uterus and adnexal regions are unremarkable.  Diverticulosis is again seen mainly involving the sigmoid colon, however there is no evidence of diverticulitis.  No evidence of abscess.  No evidence of dilated bowel loops.  A small periumbilical hernia is again seen containing only omental fat.  No herniated bowel loops.  IMPRESSION:  1.  Findings suspicious for mild acute pancreatitis, mainly involving the tail.  Suggest correlation with serum  amylase and lipase levels. No evidence of peripancreatic fluid collections or other complication. 2.  Diverticulosis.  No radiographic evidence of diverticulitis. 3.  Stable small periumbilical hernia containing only omental fat.   Original Report Authenticated By: Myles Rosenthal, M.D.    Dg Chest Port 1 View  12/06/2012  *RADIOLOGY REPORT*  Clinical Data: Atrial fibrillation.  PORTABLE CHEST - 1 VIEW  Comparison: Chest x-ray 12/04/2012.  Findings: Lung volumes are slightly low.  No acute consolidative air space disease.  No pleural effusions.  Pulmonary vasculature is normal.  Heart size is upper limits of normal.  There is slight obscuration of the apex of the heart, compatible with prominent epicardial fat noted on prior CT of the abdomen pelvis 12/04/2012. Upper mediastinal contours are within normal limits.  IMPRESSION: 1.  Low lung volumes without radiographic evidence of acute cardiopulmonary disease.   Original Report Authenticated By: Trudie Reed, M.D.    Dg Chest Port 1 View  12/04/2012  *RADIOLOGY REPORT*  Clinical Data: Cough.  Congestive heart failure.  PORTABLE  CHEST - 1 VIEW  Comparison: 10/10/2012  Findings: Mild cardiomegaly noted.  Prominent epicardial fat pad appears stable.  The visualized lungs appear clear.  IMPRESSION:  1.  Mild cardiomegaly with prominent epicardial adipose tissue.   Original Report Authenticated By: Gaylyn Rong, M.D.    US Abdomen Limited Ruq  12/04/2012  *RADIOLOGY REPORT*  Clinical Data:  Acute pancreatitis.  Evaluate for gallstones as the etiology.  LIMITED ABDOMINAL ULTRASOUND - RIGHT UPPER QUADRANT  Comparison:  CT abdomen and pelvis earlier same date.  Findings:  Gallbladder:  Numerous shadowing calculi small amount of associated echogenic sludge.  No gallbladder wall thickening or pericholecystic fluid.  Positive sonographic Murphy's sign according to the ultrasound technologist.  Common bile duct:  Mildly dilated up to approximately 10 mm diameter  centrally.  Obscured distally by duodenal bowel gas.  No visible common duct stones on the earlier CT.  Liver:  Diffusely increased and coarsened echotexture without focal parenchymal abnormality.  Patent portal vein with hepatopetal flow.  IMPRESSION:  1.  Cholelithiasis and gallbladder sludge.  Positive sonographic Murphy's sign without other sonographic findings for acute cholecystitis. 2.  Mild biliary ductal dilation up to 10 mm centrally; distal duct obscured by overlying bowel gas, but no visible bile duct stones on the earlier CT. 3.  Diffuse hepatic steatosis and/or hepatocellular disease without focal hepatic parenchymal abnormality.                    Original Report Authenticated By: Hulan Saas, M.D.     Microbiology: Recent Results (from the past 240 hour(s))  URINE CULTURE     Status: Normal   Collection Time   12/04/12 10:37 AM      Component Value Range Status Comment   Specimen Description URINE, RANDOM   Final    Special Requests NONE   Final    Culture  Setup Time 12/04/2012 22:18   Final    Colony Count 15,000 COLONIES/ML   Final    Culture KLEBSIELLA PNEUMONIAE   Final    Report Status 12/06/2012 FINAL   Final    Organism ID, Bacteria KLEBSIELLA PNEUMONIAE   Final      Labs: Basic Metabolic Panel:  Lab 12/07/12 1610 12/06/12 0610 12/05/12 0530 12/04/12 1034  NA 134* 133* 133* 134*  K 3.6 3.4* 3.6 3.7  CL 101 100 98 100  CO2 26 26 25 24   GLUCOSE 133* 93 82 136*  BUN 4* 5* 5* 8  CREATININE 0.65 0.71 0.68 0.66  CALCIUM 8.7 8.5 8.5 9.2  MG -- -- -- --  PHOS -- -- -- --   Liver Function Tests:  Lab 12/07/12 0538 12/06/12 0610 12/05/12 0530 12/04/12 1034  AST 18 36 108* 247*  ALT 49* 77* 132* 175*  ALKPHOS 123* 139* 173* 180*  BILITOT 0.3 0.4 0.6 2.2*  PROT 6.4 6.8 7.2 7.4  ALBUMIN 2.4* 2.7* 3.1* 3.3*    Lab 12/06/12 0610 12/05/12 0531 12/04/12 1034  LIPASE 47 -- >3000*  AMYLASE 63 516* --   No results found for this basename: AMMONIA:5 in the last  168 hours CBC:  Lab 12/07/12 0538 12/06/12 0610 12/05/12 0530 12/04/12 1034  WBC 14.6* 19.0* 13.8* 11.1*  NEUTROABS -- -- -- 8.8*  HGB 10.3* 11.6* 12.4 12.9  HCT 32.3* 36.2 37.9 38.8  MCV 89.5 89.4 89.0 86.4  PLT 206 208 213 231   Cardiac Enzymes:  Lab 12/04/12 1034  CKTOTAL --  CKMB --  CKMBINDEX --  TROPONINI <0.30   BNP: BNP (last 3 results)  Basename 12/06/12 0900  PROBNP 1153.0*   CBG: No results found for this basename: GLUCAP:5 in the last 168 hours     Signed:  Tesean Stump  [facility] 12/08/2012, 7:08 PM

## 2013-01-07 ENCOUNTER — Encounter: Payer: Self-pay | Admitting: Cardiology

## 2014-03-23 ENCOUNTER — Inpatient Hospital Stay (HOSPITAL_COMMUNITY)
Admission: EM | Admit: 2014-03-23 | Discharge: 2014-03-28 | DRG: 418 | Disposition: A | Discharge status: Home / Self Care | Payer: Medicare HMO | Attending: Internal Medicine | Admitting: Internal Medicine

## 2014-03-23 ENCOUNTER — Emergency Department (HOSPITAL_COMMUNITY): Payer: Medicare HMO

## 2014-03-23 ENCOUNTER — Encounter (HOSPITAL_COMMUNITY): Payer: Self-pay | Admitting: Emergency Medicine

## 2014-03-23 DIAGNOSIS — Z79899 Other long term (current) drug therapy: Secondary | ICD-10-CM

## 2014-03-23 DIAGNOSIS — K8062 Calculus of gallbladder and bile duct with acute cholecystitis without obstruction: Secondary | ICD-10-CM | POA: Diagnosis present

## 2014-03-23 DIAGNOSIS — E669 Obesity, unspecified: Secondary | ICD-10-CM

## 2014-03-23 DIAGNOSIS — E8779 Other fluid overload: Secondary | ICD-10-CM | POA: Diagnosis not present

## 2014-03-23 DIAGNOSIS — K420 Umbilical hernia with obstruction, without gangrene: Secondary | ICD-10-CM | POA: Diagnosis present

## 2014-03-23 DIAGNOSIS — Z6841 Body Mass Index (BMI) 40.0 and over, adult: Secondary | ICD-10-CM

## 2014-03-23 DIAGNOSIS — I4891 Unspecified atrial fibrillation: Secondary | ICD-10-CM

## 2014-03-23 DIAGNOSIS — F411 Generalized anxiety disorder: Secondary | ICD-10-CM

## 2014-03-23 DIAGNOSIS — K859 Acute pancreatitis without necrosis or infection, unspecified: Principal | ICD-10-CM

## 2014-03-23 DIAGNOSIS — K219 Gastro-esophageal reflux disease without esophagitis: Secondary | ICD-10-CM | POA: Diagnosis present

## 2014-03-23 DIAGNOSIS — K810 Acute cholecystitis: Secondary | ICD-10-CM

## 2014-03-23 DIAGNOSIS — K851 Biliary acute pancreatitis without necrosis or infection: Secondary | ICD-10-CM | POA: Diagnosis present

## 2014-03-23 DIAGNOSIS — E039 Hypothyroidism, unspecified: Secondary | ICD-10-CM

## 2014-03-23 LAB — LIPASE, BLOOD: Lipase: >3000 U/L — ABNORMAL HIGH (ref 11–59)

## 2014-03-23 LAB — CBC WITH DIFFERENTIAL/PLATELET
BASOS ABS: 0 10*3/uL (ref 0.0–0.1)
BASOS PCT: 0 % (ref 0–1)
EOS ABS: 0.2 10*3/uL (ref 0.0–0.7)
EOS PCT: 2 % (ref 0–5)
HCT: 42.2 % (ref 36.0–46.0)
Hemoglobin: 14.2 g/dL (ref 12.0–15.0)
LYMPHS PCT: 20 % (ref 12–46)
Lymphs Abs: 2 10*3/uL (ref 0.7–4.0)
MCH: 28.6 pg (ref 26.0–34.0)
MCHC: 33.6 g/dL (ref 30.0–36.0)
MCV: 84.9 fL (ref 78.0–100.0)
Monocytes Absolute: 0.9 10*3/uL (ref 0.1–1.0)
Monocytes Relative: 9 % (ref 3–12)
Neutro Abs: 6.9 10*3/uL (ref 1.7–7.7)
Neutrophils Relative %: 69 % (ref 43–77)
PLATELETS: 259 10*3/uL (ref 150–400)
RBC: 4.97 MIL/uL (ref 3.87–5.11)
RDW: 14.1 % (ref 11.5–15.5)
WBC: 10.1 10*3/uL (ref 4.0–10.5)

## 2014-03-23 LAB — COMPREHENSIVE METABOLIC PANEL
ALBUMIN: 3.4 g/dL — AB (ref 3.5–5.2)
ALT: 118 U/L — AB (ref 0–35)
AST: 216 U/L — AB (ref 0–37)
Alkaline Phosphatase: 187 U/L — ABNORMAL HIGH (ref 39–117)
BUN: 10 mg/dL (ref 6–23)
CALCIUM: 9.5 mg/dL (ref 8.4–10.5)
CO2: 25 meq/L (ref 19–32)
CREATININE: 0.72 mg/dL (ref 0.50–1.10)
Chloride: 98 mEq/L (ref 96–112)
GFR calc Af Amer: >90 mL/min (ref >90)
GFR calc non Af Amer: 88 mL/min — ABNORMAL LOW (ref >90)
Glucose, Bld: 97 mg/dL (ref 70–99)
Potassium: 4.2 mEq/L (ref 3.7–5.3)
SODIUM: 138 meq/L (ref 137–147)
TOTAL PROTEIN: 7.9 g/dL (ref 6.0–8.3)
Total Bilirubin: 2.3 mg/dL — ABNORMAL HIGH (ref 0.3–1.2)

## 2014-03-23 MED ORDER — SODIUM CHLORIDE 0.9 % IV BOLUS (SEPSIS): 1000.0000 mL | Freq: Once | INTRAVENOUS | Status: DC

## 2014-03-23 MED ORDER — LEVOTHYROXINE SODIUM 100 MCG PO TABS
100.0000 ug | ORAL_TABLET | Freq: Every day | ORAL | Status: DC
  Administered 2014-03-24 – 2014-03-28 (×4): 100 ug via ORAL
  Filled 2014-03-23 (×6): qty 1

## 2014-03-23 MED ORDER — PANTOPRAZOLE SODIUM 40 MG PO TBEC
40.0000 mg | DELAYED_RELEASE_TABLET | Freq: Every day | ORAL | Status: DC
  Filled 2014-03-23: qty 1

## 2014-03-23 MED ORDER — LORAZEPAM 2 MG/ML IJ SOLN: 0.5000 mg | Freq: Once | INTRAMUSCULAR | Status: DC

## 2014-03-23 MED ORDER — ONDANSETRON HCL 4 MG/2ML IJ SOLN
4.0000 mg | Freq: Three times a day (TID) | INTRAMUSCULAR | Status: DC | PRN
Start: 2014-03-23 — End: 2014-03-23

## 2014-03-23 MED ORDER — ONDANSETRON HCL 4 MG/2ML IJ SOLN
4.0000 mg | Freq: Once | INTRAMUSCULAR | Status: AC
  Administered 2014-03-23: 4 mg via INTRAVENOUS
  Filled 2014-03-23: qty 2

## 2014-03-23 MED ORDER — ONDANSETRON HCL 4 MG/2ML IJ SOLN
4.0000 mg | Freq: Four times a day (QID) | INTRAMUSCULAR | Status: DC | PRN
  Filled 2014-03-23: qty 2

## 2014-03-23 MED ORDER — PANTOPRAZOLE SODIUM 40 MG PO TBEC: 40.0000 mg | DELAYED_RELEASE_TABLET | Freq: Every day | ORAL | Status: DC

## 2014-03-23 MED ORDER — HEPARIN SODIUM (PORCINE) 5000 UNIT/ML IJ SOLN
5000.0000 [IU] | Freq: Three times a day (TID) | INTRAMUSCULAR | Status: DC
  Administered 2014-03-23 – 2014-03-24 (×2): 5000 [IU] via SUBCUTANEOUS
  Filled 2014-03-23 (×5): qty 1

## 2014-03-23 MED ORDER — PANTOPRAZOLE SODIUM 40 MG IV SOLR
40.0000 mg | Freq: Every day | INTRAVENOUS | Status: DC
  Administered 2014-03-24 – 2014-03-27 (×5): 40 mg via INTRAVENOUS
  Filled 2014-03-23 (×6): qty 40

## 2014-03-23 MED ORDER — SODIUM CHLORIDE 0.9 % IV SOLN
INTRAVENOUS | Status: DC
  Administered 2014-03-24: 1000 mL via INTRAVENOUS
  Administered 2014-03-24 – 2014-03-26 (×4): via INTRAVENOUS

## 2014-03-23 MED ORDER — HYDROMORPHONE HCL PF 1 MG/ML IJ SOLN
1.0000 mg | INTRAMUSCULAR | Status: DC | PRN
  Administered 2014-03-23 – 2014-03-24 (×3): 1 mg via INTRAVENOUS
  Filled 2014-03-23 (×3): qty 1

## 2014-03-23 MED ORDER — HYDROMORPHONE HCL PF 1 MG/ML IJ SOLN: 1.0000 mg | INTRAMUSCULAR | Status: DC | PRN

## 2014-03-23 MED ORDER — SODIUM CHLORIDE 0.9 % IV SOLN
INTRAVENOUS | Status: AC
  Administered 2014-03-24 (×2): via INTRAVENOUS

## 2014-03-23 NOTE — ED Notes (Signed)
Pt presents to department for evaluation of diffuse abdominal pain and nausea. Ongoing since Saturday. 9/10 pain at the time. Was seen in ER in [place], [place] over the weekend and told she needed to have gallbladder removed. Pt is alert and oriented x4.

## 2014-03-23 NOTE — ED Notes (Signed)
PA student at bedside.

## 2014-03-23 NOTE — ED Notes (Signed)
Pt remains in ultrasound.

## 2014-03-23 NOTE — ED Notes (Signed)
Patient returned from US.

## 2014-03-23 NOTE — ED Notes (Signed)
Patient still off the unit for testing 

## 2014-03-23 NOTE — ED Provider Notes (Signed)
CSN: [id number]     Arrival date & time 03/23/14  1643 History   First MD Initiated Contact with Patient 03/23/14 1741     Chief Complaint  Patient presents with  . Abdominal Pain  . Nausea     (Consider location/radiation/quality/duration/timing/severity/associated sxs/prior Treatment) HPI Lauren [name] is a 68 y.o. female who presents to emergency department complaining of abdominal pain, nausea, vomiting. Patient states she had similar pain a year and a half ago, was seen here, was told she had gallstone pancreatitis, but spontaneously got better and was discharged. Patient states her pain began again 5 days ago. She was seen at time of the pain onset at [facility] in ER. Was told that her labs were all "okay," but she needed to have gallbladder out because she had stones in the gallbladder. Patient was discharged at that time. Patient states pain has worsened since then. The pain is in the upper abdomen, radiates across to the back. Reports associated nausea and vomiting. Denies any fever, chills, malaise. No changes in her bowels. No urinary symptoms.  Past Medical History  Diagnosis Date  . Obesity   . Diverticulosis   . Periumbilical hernia   . Hypothyroidism   . Acute cholecystitis 12/06/2012  . Atrial fibrillation with RVR 12/06/2012  . Pancreatitis, acute 12/04/2012   History reviewed. No pertinent past surgical history. No family history on file. History  Substance Use Topics  . Smoking status: Never Smoker   . Smokeless tobacco: Not on file  . Alcohol Use: No   OB History   Grav Para Term Preterm Abortions TAB SAB Ect Mult Living                 Review of Systems  Constitutional: Negative for fever and chills.  Respiratory: Negative for cough, chest tightness and shortness of breath.   Cardiovascular: Negative for chest pain, palpitations and leg swelling.  Gastrointestinal: Positive for nausea, vomiting and abdominal pain. Negative for diarrhea.  Genitourinary:  Negative for dysuria, flank pain and pelvic pain.  Musculoskeletal: Negative for arthralgias, myalgias, neck pain and neck stiffness.  Skin: Negative for rash.  Neurological: Negative for dizziness, weakness and headaches.  All other systems reviewed and are negative.     Allergies  Codeine; Compazine; Prednisone; and Vicodin  Home Medications   Prior to Admission medications   Medication Sig Start Date End Date Taking? Authorizing Provider  aspirin-acetaminophen-caffeine (EXCEDRIN MIGRAINE) [phone] MG per tablet Take 1 tablet by mouth every 6 (six) hours as needed for headache.   Yes Historical Provider, MD  levothyroxine (SYNTHROID, LEVOTHROID) 100 MCG tablet Take 100 mcg by mouth daily.   Yes Historical Provider, MD  omeprazole (PRILOSEC) 20 MG capsule Take 1 capsule (20 mg total) by mouth daily. For acid reflux. 12/07/12  Yes Rexene Alberts, MD  hydrocortisone cream 1 % Apply topically 2 (two) times daily as needed. Itchy rash. 12/07/12   Rexene Alberts, MD   BP 159/85  Pulse 94  Temp(Src) 97.4 F (36.3 C) (Oral)  Resp 18  SpO2 97% Physical Exam  Nursing note and vitals reviewed. Constitutional: She is oriented to person, place, and time. She appears well-developed and well-nourished.  Appears to be in pain and tearful  HENT:  Head: Normocephalic.  Eyes: Conjunctivae are normal.  Neck: Neck supple.  Cardiovascular: Normal rate, regular rhythm and normal heart sounds.   Pulmonary/Chest: Effort normal and breath sounds normal. No respiratory distress. She has no wheezes. She has no rales.  Abdominal:  Soft. Bowel sounds are normal. She exhibits no distension. There is tenderness. There is guarding. There is no rebound.  Right upper quadrant left upper quadrant tenderness.  Musculoskeletal: She exhibits no edema.  Neurological: She is alert and oriented to person, place, and time.  Skin: Skin is warm and dry.  Psychiatric: She has a normal mood and affect. Her behavior is  normal.    ED Course  Procedures (including critical care time) Labs Review Labs Reviewed  COMPREHENSIVE METABOLIC PANEL - Abnormal; Notable for the following:    Albumin 3.4 (*)    AST 216 (*)    ALT 118 (*)    Alkaline Phosphatase 187 (*)    Total Bilirubin 2.3 (*)    GFR calc non Af Amer 88 (*)    All other components within normal limits  LIPASE, BLOOD - Abnormal; Notable for the following:    Lipase >3000 (*)    All other components within normal limits  CBC WITH DIFFERENTIAL  URINALYSIS, ROUTINE W REFLEX MICROSCOPIC    Imaging Review US Abdomen Limited Ruq  03/23/2014   CLINICAL DATA:  Abdominal pain.  EXAM: US ABDOMEN LIMITED - RIGHT UPPER QUADRANT  COMPARISON:  12/04/2012  FINDINGS: Gallbladder:  Multiple echogenic stones are identified within the gallbladder. These measure up to 3 mm. No gallbladder wall thickening or pericholecystic fluid. Positive sonographic Murphy's sign.  Common bile duct:  Diameter: Increased in caliber measuring 9.8 mm.  Liver:  Diffuse increased parenchymal echogenicity. No focal lesion identified.  IMPRESSION: 1. Gallstones and positive sonographic Murphy's side. Cannot rule out acute cholecystitis. 2. Increase caliber of the common bile duct. If there is a clinical concern for choledocholithiasis then an MRCP may be helpful for further assessment.   Electronically Signed   By: Kerby Moors M.D.   On: 03/23/2014 19:07     EKG Interpretation None      MDM   Final diagnoses:  Gallstone pancreatitis    Pt with epigastric pain. Hx of gallstone pancreatitis. Will get labs, [place] abd.   8:03 PM Labs with elevated LFTs, lipase >3000, bilirubin 2.3 US showing cholecystitis with possible CBD stone.  Spoke with Dr. Georgette Dover, will consult, will see in AM.  Spoke with [facility], will admit.   Spoke with Dr. Collene Mares, GI. Will be seen tomorrow by Dr. Benson Norway.   Dr. Collene Mares called back, asked for 4 L saline. I ordered those  Filed Vitals:   03/23/14 1653 03/23/14  1959 03/23/14 2104  BP: 159/85 130/63 154/83  Pulse: 94 65 60  Temp: 97.4 F (36.3 C)  97.3 F (36.3 C)  TempSrc: Oral  Oral  Resp: 18 20 18   Height:   5\' 2"  (1.575 m)  Weight:   288 lb 14.4 oz (131.044 kg)  SpO2: 97% 100% 96%     Jasey Cortez A Marisel Tostenson, PA-C 03/24/14 0221

## 2014-03-23 NOTE — ED Notes (Signed)
Lahoma Rocker, PA-C at the bedside to update patient on the plan of care.

## 2014-03-23 NOTE — ED Notes (Signed)
Dr. Alcario Drought (Internal Med) at the bedside to see patient.

## 2014-03-23 NOTE — ED Notes (Signed)
Patient using the restroom before transport.

## 2014-03-23 NOTE — H&P (Signed)
[facility] History and Physical  Lauren [name] DOB: 11/22/1946 DOA: 03/23/2014  Referring physician: EDP PCP: No primary provider on file.   Chief Complaint: Abdominal pain   HPI: Lauren [name] is a 68 y.o. female who presents to the ED with diffuse abdominal pain, N/V, onset on Sat and has been ongoing since then.  She was admitted to [facility] last year for similar pain due to acute gallstone pancreatitis.  Unfortunately she did not follow up and have her gallbladder removed due to insurance purposes.  Patient was seen 5 days ago at [place], was told labs were "okay" but she needed her gall bladder out due to gallstones.  Pain has worsened since then.  Now in epigastric area with radiation to her upper back.  Review of Systems: Systems reviewed.  As above, otherwise negative  Past Medical History  Diagnosis Date  . Obesity   . Diverticulosis   . Periumbilical hernia   . Hypothyroidism   . Acute cholecystitis 12/06/2012  . Atrial fibrillation with RVR 12/06/2012  . Pancreatitis, acute 12/04/2012   History reviewed. No pertinent past surgical history. Social History:  reports that she has never smoked. She does not have any smokeless tobacco history on file. She reports that she does not drink alcohol or use illicit drugs.  Allergies  Allergen Reactions  . Codeine Itching  . Compazine [Prochlorperazine Edisylate]   . Prednisone Nausea And Vomiting  . Vicodin [Hydrocodone-Acetaminophen] Itching    No family history on file.   Prior to Admission medications   Medication Sig Start Date End Date Taking? Authorizing Provider  aspirin-acetaminophen-caffeine (EXCEDRIN MIGRAINE) [id number] MG per tablet Take 1 tablet by mouth every 6 (six) hours as needed for headache.   Yes Historical Provider, MD  levothyroxine (SYNTHROID, LEVOTHROID) 100 MCG tablet Take 100 mcg by mouth daily.   Yes Historical Provider, MD  omeprazole (PRILOSEC) 20 MG capsule Take 1 capsule (20 mg  total) by mouth daily. For acid reflux. 12/07/12  Yes Rexene Alberts, MD  hydrocortisone cream 1 % Apply topically 2 (two) times daily as needed. Itchy rash. 12/07/12   Rexene Alberts, MD   Physical Exam: Filed Vitals:   03/23/14 1959  BP: 130/63  Pulse: 65  Temp:   Resp: 20    BP 130/63  Pulse 65  Temp(Src) 97.4 F (36.3 C) (Oral)  Resp 20  SpO2 100%  General Appearance:    Alert, oriented, no distress, appears stated age  Head:    Normocephalic, atraumatic  Eyes:    PERRL, EOMI, sclera non-icteric        Nose:   Nares without drainage or epistaxis. Mucosa, turbinates normal  Throat:   Moist mucous membranes. Oropharynx without erythema or exudate.  Neck:   Supple. No carotid bruits.  No thyromegaly.  No lymphadenopathy.   Back:     No CVA tenderness, no spinal tenderness  Lungs:     Clear to auscultation bilaterally, without wheezes, rhonchi or rales  Chest wall:    No tenderness to palpitation  Heart:    Regular rate and rhythm without murmurs, gallops, rubs  Abdomen:     Soft, non-tender, nondistended, normal bowel sounds, no organomegaly  Genitalia:    deferred  Rectal:    deferred  Extremities:   No clubbing, cyanosis or edema.  Pulses:   2+ and symmetric all extremities  Skin:   Skin color, texture, turgor normal, no rashes or lesions  Lymph nodes:   Cervical, supraclavicular,  and axillary nodes normal  Neurologic:   CNII-XII intact. Normal strength, sensation and reflexes      throughout    Labs on Admission:  Basic Metabolic Panel:  Recent Labs Lab 03/23/14 1714  NA 138  K 4.2  CL 98  CO2 25  GLUCOSE 97  BUN 10  CREATININE 0.72  CALCIUM 9.5   Liver Function Tests:  Recent Labs Lab 03/23/14 1714  AST 216*  ALT 118*  ALKPHOS 187*  BILITOT 2.3*  PROT 7.9  ALBUMIN 3.4*    Recent Labs Lab 03/23/14 1714  LIPASE >3000*   No results found for this basename: AMMONIA,  in the last 168 hours CBC:  Recent Labs Lab 03/23/14 1714  WBC 10.1   NEUTROABS 6.9  HGB 14.2  HCT 42.2  MCV 84.9  PLT 259   Cardiac Enzymes: No results found for this basename: CKTOTAL, CKMB, CKMBINDEX, TROPONINI,  in the last 168 hours  BNP (last 3 results) No results found for this basename: PROBNP,  in the last 8760 hours CBG: No results found for this basename: GLUCAP,  in the last 168 hours  Radiological Exams on Admission: US Abdomen Limited Ruq  03/23/2014   CLINICAL DATA:  Abdominal pain.  EXAM: US ABDOMEN LIMITED - RIGHT UPPER QUADRANT  COMPARISON:  12/04/2012  FINDINGS: Gallbladder:  Multiple echogenic stones are identified within the gallbladder. These measure up to 3 mm. No gallbladder wall thickening or pericholecystic fluid. Positive sonographic Murphy's sign.  Common bile duct:  Diameter: Increased in caliber measuring 9.8 mm.  Liver:  Diffuse increased parenchymal echogenicity. No focal lesion identified.  IMPRESSION: 1. Gallstones and positive sonographic Murphy's side. Cannot rule out acute cholecystitis. 2. Increase caliber of the common bile duct. If there is a clinical concern for choledocholithiasis then an MRCP may be helpful for further assessment.   Electronically Signed   By: Kerby Moors M.D.   On: 03/23/2014 19:07    EKG: Independently reviewed.  Assessment/Plan Principal Problem:   Pancreatitis, acute Active Problems:   Acute cholecystitis   Gallstone pancreatitis   1. Acute gallstone pancreatitis with acute cholecystitis - lipase over 3k, bili 2.3, transaminases elevated, admitting patient, hydrating, NPO, repeat labs in AM, nausea control and pain control.  GI consulted, suspect they will end up doing ERCP.  Surgery consulted to eval patient in AM.  Possible CBD stone on ultrasound.  No fever nor WBC elevation at this point to suggest infection or indicate ABx therapy.  Will need to monitor these closely.    Code Status: Full  Family Communication: Husband at bedside Disposition Plan: Admit to inpatient   Time  spent: 79 min  [facility] Pager [phone]  If 7AM-7PM, please contact the day team taking care of the patient [email or web address] Password [email or web address] 03/23/2014, 8:17 PM

## 2014-03-23 NOTE — ED Notes (Signed)
2 missed IV attempts.

## 2014-03-24 DIAGNOSIS — E039 Hypothyroidism, unspecified: Secondary | ICD-10-CM

## 2014-03-24 DIAGNOSIS — K8020 Calculus of gallbladder without cholecystitis without obstruction: Secondary | ICD-10-CM

## 2014-03-24 DIAGNOSIS — K859 Acute pancreatitis without necrosis or infection, unspecified: Secondary | ICD-10-CM

## 2014-03-24 DIAGNOSIS — I4891 Unspecified atrial fibrillation: Secondary | ICD-10-CM

## 2014-03-24 DIAGNOSIS — F411 Generalized anxiety disorder: Secondary | ICD-10-CM

## 2014-03-24 DIAGNOSIS — E669 Obesity, unspecified: Secondary | ICD-10-CM

## 2014-03-24 LAB — SURGICAL PCR SCREEN
MRSA, PCR: NEGATIVE
Staphylococcus aureus: NEGATIVE

## 2014-03-24 LAB — COMPREHENSIVE METABOLIC PANEL
ALBUMIN: 2.8 g/dL — AB (ref 3.5–5.2)
ALT: 86 U/L — ABNORMAL HIGH (ref 0–35)
AST: 100 U/L — ABNORMAL HIGH (ref 0–37)
Alkaline Phosphatase: 159 U/L — ABNORMAL HIGH (ref 39–117)
BUN: 8 mg/dL (ref 6–23)
CO2: 22 mEq/L (ref 19–32)
Calcium: 8.2 mg/dL — ABNORMAL LOW (ref 8.4–10.5)
Chloride: 103 mEq/L (ref 96–112)
Creatinine, Ser: 0.64 mg/dL (ref 0.50–1.10)
GFR calc Af Amer: >90 mL/min (ref >90)
GFR calc non Af Amer: >90 mL/min (ref >90)
Glucose, Bld: 77 mg/dL (ref 70–99)
Potassium: 3.9 mEq/L (ref 3.7–5.3)
SODIUM: 137 meq/L (ref 137–147)
TOTAL PROTEIN: 6.4 g/dL (ref 6.0–8.3)
Total Bilirubin: 0.6 mg/dL (ref 0.3–1.2)

## 2014-03-24 LAB — URINALYSIS, ROUTINE W REFLEX MICROSCOPIC
GLUCOSE, UA: NEGATIVE mg/dL
Ketones, ur: 15 mg/dL — AB
LEUKOCYTES UA: NEGATIVE
Nitrite: NEGATIVE
PH: 5.5 (ref 5.0–8.0)
Protein, ur: NEGATIVE mg/dL
SPECIFIC GRAVITY, URINE: 1.02 (ref 1.005–1.030)
Urobilinogen, UA: 1 mg/dL (ref 0.0–1.0)

## 2014-03-24 LAB — URINE MICROSCOPIC-ADD ON

## 2014-03-24 LAB — CBC
HEMATOCRIT: 37.7 % (ref 36.0–46.0)
Hemoglobin: 12.1 g/dL (ref 12.0–15.0)
MCH: 27.7 pg (ref 26.0–34.0)
MCHC: 32.1 g/dL (ref 30.0–36.0)
MCV: 86.3 fL (ref 78.0–100.0)
Platelets: 200 10*3/uL (ref 150–400)
RBC: 4.37 MIL/uL (ref 3.87–5.11)
RDW: 14.2 % (ref 11.5–15.5)
WBC: 7.9 10*3/uL (ref 4.0–10.5)

## 2014-03-24 LAB — LIPASE, BLOOD: Lipase: 400 U/L — ABNORMAL HIGH (ref 11–59)

## 2014-03-24 MED ORDER — DEXTROSE 5 % IV SOLN
2.0000 g | INTRAVENOUS | Status: AC
  Administered 2014-03-25: 2 g via INTRAVENOUS
  Filled 2014-03-24: qty 2

## 2014-03-24 MED ORDER — LORAZEPAM 0.5 MG PO TABS
0.5000 mg | ORAL_TABLET | Freq: Two times a day (BID) | ORAL | Status: DC | PRN
  Administered 2014-03-24 – 2014-03-25 (×3): 0.5 mg via ORAL
  Filled 2014-03-24 (×3): qty 1

## 2014-03-24 MED ORDER — ACETAMINOPHEN 650 MG RE SUPP
650.0000 mg | Freq: Three times a day (TID) | RECTAL | Status: DC | PRN
  Filled 2014-03-24: qty 1

## 2014-03-24 MED ORDER — ENOXAPARIN SODIUM 40 MG/0.4ML ~~LOC~~ SOLN
40.0000 mg | SUBCUTANEOUS | Status: DC
  Administered 2014-03-24: 40 mg via SUBCUTANEOUS
  Filled 2014-03-24 (×2): qty 0.4

## 2014-03-24 NOTE — Consult Note (Signed)
Reason for Consult:Abdominal pain and recurrent gallstone pancreatitis Referring Physician: Dr. Glynda Lauren [name] is an 68 y.o. Lauren [name].  HPI: Patient with known gallstones and a history of prior gallstone pancreatitis, readmitted with the same problem yesterday evening..  Multiple episodes of nausea and vomiting.  Significant anxiety.  Morbidly obese.  Past Medical History  Diagnosis Date  . Obesity   . Diverticulosis   . Periumbilical hernia   . Hypothyroidism   . Acute cholecystitis 12/06/2012  . Atrial fibrillation with RVR 12/06/2012  . Pancreatitis, acute 12/04/2012    History reviewed. No pertinent past surgical history.  No family history on file.  Social History:  reports that she has never smoked. She does not have any smokeless tobacco history on file. She reports that she does not drink alcohol or use illicit drugs.  Allergies:  Allergies  Allergen Reactions  . Codeine Itching  . Compazine [Prochlorperazine Edisylate]   . Prednisone Nausea And Vomiting  . Vicodin [Hydrocodone-Acetaminophen] Itching    Medications: I have reviewed the patient's current medications.  Results for orders placed during the hospital encounter of 03/23/14 (from the past 48 hour(s))  CBC WITH DIFFERENTIAL     Status: None   Collection Time    03/23/14  5:14 PM      Result Value Ref Range   WBC 10.1  4.0 - 10.5 K/uL   RBC 4.97  3.87 - 5.Lauren Lauren [name] MIL/uL   Hemoglobin 14.2  12.0 - 15.0 g/dL   HCT 42.2  36.0 - 46.0 %   MCV 84.9  78.0 - 100.0 fL   MCH 28.6  26.0 - 34.0 pg   MCHC 33.6  30.0 - 36.0 g/dL   RDW 14.1  Lauren Lauren [name].5 - 15.5 %   Platelets 259  150 - 400 K/uL   Neutrophils Relative % 69  43 - 77 %   Neutro Abs 6.9  1.7 - 7.7 K/uL   Lymphocytes Relative 20  12 - 46 %   Lymphs Abs 2.0  0.7 - 4.0 K/uL   Monocytes Relative 9  3 - 12 %   Monocytes Absolute 0.9  0.1 - 1.0 K/uL   Eosinophils Relative 2  0 - 5 %   Eosinophils Absolute 0.2  0.0 - 0.7 K/uL   Basophils Relative 0  0 - 1 %   Basophils Absolute 0.0  0.0 - 0.1 K/uL  COMPREHENSIVE METABOLIC PANEL     Status: Abnormal   Collection Time    03/23/14  5:14 PM      Result Value Ref Range   Sodium 138  137 - 147 mEq/L   Potassium 4.2  3.7 - 5.3 mEq/L   Chloride 98  96 - 112 mEq/L   CO2 25  19 - 32 mEq/L   Glucose, Bld 97  70 - 99 mg/dL   BUN 10  6 - 23 mg/dL   Creatinine, Ser 0.72  0.50 - 1.10 mg/dL   Calcium 9.5  8.4 - 10.5 mg/dL   Total Protein 7.9  6.0 - 8.3 g/dL   Albumin 3.4 (*) 3.5 - 5.2 g/dL   AST 216 (*) 0 - 37 U/L   ALT 118 (*) 0 - 35 U/L   Alkaline Phosphatase 187 (*) 39 - 117 U/L   Total Bilirubin 2.3 (*) 0.3 - 1.2 mg/dL   GFR calc non Af Amer 88 (*) >90 mL/min   GFR calc Af Amer >90  >90 mL/min   Comment: (NOTE)  The eGFR has been calculated using the CKD EPI equation.     This calculation has not been validated in all clinical situations.     eGFR's persistently <90 mL/min signify possible Chronic Kidney     Disease.  LIPASE, BLOOD     Status: Abnormal   Collection Time    03/23/14  5:14 PM      Result Value Ref Range   Lipase >3000 (*) Lauren Lauren [name] - 59 U/L   Comment: RESULT CONFIRMED BY AUTOMATED DILUTION  SURGICAL PCR SCREEN     Status: None   Collection Time    03/24/14  4:29 AM      Result Value Ref Range   MRSA, PCR NEGATIVE  NEGATIVE   Staphylococcus aureus NEGATIVE  NEGATIVE   Comment:            The Xpert SA Assay (FDA     approved for NASAL specimens     in patients over 34 years of age),     is one component of     a comprehensive surveillance     program.  Test performance has     been validated by [employer] for patients greater     than or equal to 76 year old.     It is not intended     to diagnose infection nor to     guide or monitor treatment.  CBC     Status: None   Collection Time    03/24/14  7:10 AM      Result Value Ref Range   WBC 7.9  4.0 - 10.5 K/uL   RBC 4.37  3.87 - 5.Lauren Lauren [name] MIL/uL   Hemoglobin 12.1  12.0 - 15.0 g/dL   HCT 37.7  36.0 - 46.0 %   MCV 86.3   78.0 - 100.0 fL   MCH 27.7  26.0 - 34.0 pg   MCHC 32.1  30.0 - 36.0 g/dL   RDW 14.2  Lauren Lauren [name].5 - 15.5 %   Platelets 200  150 - 400 K/uL  COMPREHENSIVE METABOLIC PANEL     Status: Abnormal   Collection Time    03/24/14  7:10 AM      Result Value Ref Range   Sodium 137  137 - 147 mEq/L   Potassium 3.9  3.7 - 5.3 mEq/L   Chloride 103  96 - 112 mEq/L   CO2 22  19 - 32 mEq/L   Glucose, Bld 77  70 - 99 mg/dL   BUN 8  6 - 23 mg/dL   Creatinine, Ser 0.64  0.50 - 1.10 mg/dL   Calcium 8.2 (*) 8.4 - 10.5 mg/dL   Total Protein 6.4  6.0 - 8.3 g/dL   Albumin 2.8 (*) 3.5 - 5.2 g/dL   AST 100 (*) 0 - 37 U/L   ALT 86 (*) 0 - 35 U/L   Alkaline Phosphatase 159 (*) 39 - 117 U/L   Total Bilirubin 0.6  0.3 - 1.2 mg/dL   GFR calc non Af Amer >90  >90 mL/min   GFR calc Af Amer >90  >90 mL/min   Comment: (NOTE)     The eGFR has been calculated using the CKD EPI equation.     This calculation has not been validated in all clinical situations.     eGFR's persistently <90 mL/min signify possible Chronic Kidney     Disease.  URINALYSIS, ROUTINE W REFLEX MICROSCOPIC     Status: Abnormal  Collection Time    03/24/14  9:10 AM      Result Value Ref Range   Color, Urine AMBER (*) YELLOW   Comment: BIOCHEMICALS MAY BE AFFECTED BY COLOR   APPearance CLEAR  CLEAR   Specific Gravity, Urine 1.020  1.005 - 1.030   pH 5.5  5.0 - 8.0   Glucose, UA NEGATIVE  NEGATIVE mg/dL   Hgb urine dipstick SMALL (*) NEGATIVE   Bilirubin Urine SMALL (*) NEGATIVE   Ketones, ur 15 (*) NEGATIVE mg/dL   Protein, ur NEGATIVE  NEGATIVE mg/dL   Urobilinogen, UA 1.0  0.0 - 1.0 mg/dL   Nitrite NEGATIVE  NEGATIVE   Leukocytes, UA NEGATIVE  NEGATIVE  URINE MICROSCOPIC-ADD ON     Status: Abnormal   Collection Time    03/24/14  9:10 AM      Result Value Ref Range   Squamous Epithelial / LPF FEW (*) RARE   WBC, UA 0-2  <3 WBC/hpf   Bacteria, UA FEW (*) RARE   Urine-Other MUCOUS PRESENT      US Abdomen Limited Ruq  03/23/2014    CLINICAL DATA:  Abdominal pain.  EXAM: US ABDOMEN LIMITED - RIGHT UPPER QUADRANT  COMPARISON:  12/04/2012  FINDINGS: Gallbladder:  Multiple echogenic stones are identified within the gallbladder. These measure up to 3 mm. No gallbladder wall thickening or pericholecystic fluid. Positive sonographic Murphy's sign.  Common bile duct:  Diameter: Increased in caliber measuring 9.8 mm.  Liver:  Diffuse increased parenchymal echogenicity. No focal lesion identified.  IMPRESSION: 1. Gallstones and positive sonographic Murphy's side. Cannot rule out acute cholecystitis. 2. Increase caliber of the common bile duct. If there is a clinical concern for choledocholithiasis then an MRCP may be helpful for further assessment.   Electronically Signed   By: Kerby Moors M.D.   On: 03/23/2014 19:07    Review of Systems  Constitutional: Positive for weight loss (40 pounds over the past year, intentional). Negative for fever and chills.  HENT: Negative.   Eyes: Negative.   Respiratory: Negative.   Cardiovascular: Negative.   Gastrointestinal: Positive for heartburn, nausea, vomiting and abdominal pain.  Genitourinary: Negative.   Musculoskeletal: Negative.   Skin: Positive for rash (around left lower extremity at the ankle.).  Neurological: Negative.   Endo/Heme/Allergies: Negative.   Psychiatric/Behavioral: The patient is nervous/anxious.    Blood pressure 128/69, pulse 65, temperature 97.8 F (36.6 C), temperature source Oral, resp. rate 20, height 5' 2"  (1.575 m), weight 131.044 kg (288 lb 14.4 oz), SpO2 97.00%. Physical Exam  Constitutional: She is oriented to person, place, and time. She appears well-developed.  Morbidly obese  HENT:  Head: Normocephalic and atraumatic.  Eyes: Conjunctivae and EOM are normal. Pupils are equal, round, and reactive to light.  Neck: Normal range of motion.  Cardiovascular: Normal rate, regular rhythm, normal heart sounds and intact distal pulses.   No murmur  heard. Respiratory: Effort normal and breath sounds normal.  GI: Soft. Normal appearance and bowel sounds are normal. There is tenderness in the right upper quadrant. There is no rigidity, no rebound and no guarding. A hernia (umbilical) is present.    Musculoskeletal: Normal range of motion.  Neurological: She is alert and oriented to person, place, and time.  Skin: Skin is warm and dry.  Psychiatric: She has a normal mood and affect. Her behavior is normal. Judgment and thought content normal.    Assessment/Plan: Gallstone pancreatitis Umbilical hernia with chronic incarceration.  The patient's LFTs have  improved so far today, but Lipase was not checked.  Will have lipase checked and added on to AM labs.  Redraw if necessary.  Patient can have clear liquids until midnight.  Then NPO.  May go to surgery tomorrow or Saturday.  Will make sure she has proper DVT prophylaxis.  Will get consent.  Will not post for the OR until AM labs are available. Will prescribe Ativan 0.5 mg bid prn for anxiety.  Gwenyth Ober 03/24/2014, 12:51 PM

## 2014-03-24 NOTE — Progress Notes (Signed)
[facility] PROGRESS NOTE  Lauren [name]:[id number] DOB: [birth year] DOA: 03/23/2014 PCP: No primary provider on file.  HPI/Subjective: Feeling significantly better today.  Presently not in any pain.  However, she is rather anxious about needing surgery and need for blood draws.  She took ativan prescribed by surgery during interview.  She has been tolerating clears well, has been urinating and had a BM today.  No N/V.    Assessment/Plan:  Gallstone Pancreatitis / Cholelithiasis / ?Choledocholithiasis - US Abdomen: Gallstones, dilated common bile duct 9.43mm.   - No infection suspected at this point.  Afebrile.  No WBC elevation.  No gallbladder wall thickening or pericholecystic fluid - Elevated AST/ALT/AP improved since admission.  T.Billi now WNL.  Lipase >3000 on admission.   - Aggressive treatment with IVFs.   - NPO advanced to clears till midnight 03/24/14.  NPO after midnight. - Nausea and pain control - Surgery saw patient and will plan for surgery tomorrow or Saturday pending Lipase. - Improvement in LFTs likely consistent with passing a CBD stone. -ERCP to be decided after surgery and IOC  Umbilical Hernia with Chronic Incarceration - Currently asymptomatic.  Can be repaired during surgery  Acute Anxiety - Due to the possibility of needing surgery.  Also anxious about blood draws. - Ativan 0.5 mg BID PRN  Disposition: Stable  DVT Prophylaxis: Heparin  Code Status: Full Family Communication: None Disposition Plan: Home   Consultants:  Surgery, Gastroenterology  Procedures:  None  Antibiotics:  None   Objective: Filed Vitals:   03/24/14 1344  BP: 138/77  Pulse: 63  Temp: 97.5 F (36.4 C)  Resp: 18    Intake/Output Summary (Last 24 hours) at 03/24/14 1416 Last data filed at 03/24/14 0636  Gross per 24 hour  Intake 820.83 ml  Output      2 ml  Net 818.83 ml   Filed Weights   03/23/14 2104  Weight: 131.044 kg (288 lb 14.4 oz)     Exam:   Physical Exam General: Awake, alert, and oriented x3.  NAD.  Morbid obesity. HEENT: Normocephalic/Atraumatic, conj. clear, anicteric sclera, PERRLA, EOMs intact, Oropharynx free of exudate Neck: Excess adipose tissue. Supple, full ROM Lungs: Breathing unlabored, CTA b/l anterior/posterior CV: RRR, no m/g/r appreciated Abdomen: Protuberant.  Umbilical hernia. Non distended, +BS, Mildly tender to deep palpation RUQ / epigastric.  Negative Murphy's Extremities: No edema, warm to touch Skin: No rash or lesions Neuro: No focal deficits Psych: Behavior, judgement, and thought content normal.    Data Reviewed: Basic Metabolic Panel:  Recent Labs Lab 03/23/14 1714 03/24/14 0710  NA 138 137  K 4.2 3.9  CL 98 103  CO2 25 22  GLUCOSE 97 77  BUN 10 8  CREATININE 0.72 0.64  CALCIUM 9.5 8.2*   Liver Function Tests:  Recent Labs Lab 03/23/14 1714 03/24/14 0710  AST 216* 100*  ALT 118* 86*  ALKPHOS 187* 159*  BILITOT 2.3* 0.6  PROT 7.9 6.4  ALBUMIN 3.4* 2.8*    Recent Labs Lab 03/23/14 1714  LIPASE >3000*   CBC:  Recent Labs Lab 03/23/14 1714 03/24/14 0710  WBC 10.1 7.9  NEUTROABS 6.9  --   HGB 14.2 12.1  HCT 42.2 37.7  MCV 84.9 86.3  PLT 259 200    Recent Results (from the past 240 hour(s))  SURGICAL PCR SCREEN     Status: None   Collection Time    03/24/14  4:29 AM      Result Value  Ref Range Status   MRSA, PCR NEGATIVE  NEGATIVE Final   Staphylococcus aureus NEGATIVE  NEGATIVE Final   Comment:            The Xpert SA Assay (FDA     approved for NASAL specimens     in patients over 60 years of age),     is one component of     a comprehensive surveillance     program.  Test performance has     been validated by [employer] for patients greater     than or equal to 62 year old.     It is not intended     to diagnose infection nor to     guide or monitor treatment.     Studies: US Abdomen Limited Ruq  03-24-14   CLINICAL  DATA:  Abdominal pain.  EXAM: US ABDOMEN LIMITED - RIGHT UPPER QUADRANT  COMPARISON:  12/04/2012  FINDINGS: Gallbladder:  Multiple echogenic stones are identified within the gallbladder. These measure up to 3 mm. No gallbladder wall thickening or pericholecystic fluid. Positive sonographic Murphy's sign.  Common bile duct:  Diameter: Increased in caliber measuring 9.8 mm.  Liver:  Diffuse increased parenchymal echogenicity. No focal lesion identified.  IMPRESSION: 1. Gallstones and positive sonographic Murphy's side. Cannot rule out acute cholecystitis. 2. Increase caliber of the common bile duct. If there is a clinical concern for choledocholithiasis then an MRCP may be helpful for further assessment.   Electronically Signed   By: Kerby Moors M.D.   On: 03-24-2014 19:07    Scheduled Meds: . [COMPLETED] sodium chloride   Intravenous STAT  . [START ON 03/25/2014] cefOXitin  2 g Intravenous On Call to OR  . heparin  5,000 Units Subcutaneous 3 times per day  . levothyroxine  100 mcg Oral QAC breakfast  . pantoprazole (PROTONIX) IV  40 mg Intravenous QHS  . sodium chloride  1,000 mL Intravenous Once  . sodium chloride  1,000 mL Intravenous Once  . sodium chloride  1,000 mL Intravenous Once   Continuous Infusions: . sodium chloride      Principal Problem:   Pancreatitis, acute Active Problems:   Acute cholecystitis   Gallstone pancreatitis   Time spent: [place], Student-PA   [facility]  If 7PM-7AM, please contact night-coverage at [email or web address], password [facility] 03/24/2014, 2:16 PM  LOS: 1 day    Addendum  Patient seen and examined, chart and data base reviewed.  I agree with the above assessment and plan.  For full details please see Mr. Rudean Haskell, Student-PA note.  I reviewed an amended the above note as appropriate.   Birdie Hopes, MD [facility] Pager: [phone] 03/24/2014, 4:37 PM

## 2014-03-24 NOTE — Plan of Care (Signed)
Problem: Phase I Progression Outcomes Goal: Initial discharge plan identified Outcome: Completed/Met Date Met:  03/24/14 To return home with husband

## 2014-03-24 NOTE — Progress Notes (Signed)
Utilization review completed.  

## 2014-03-24 NOTE — Progress Notes (Signed)
Pt arrive to unit in no s/s of distress. Husband at bedside. Charge nurse receive report prior to pt's arrival. Pt oriented to room. Whiteboard updated. Callbell within reach. VS stable. Will continue to monitor.

## 2014-03-24 NOTE — Consult Note (Signed)
Unassigned Consult  Reason for Consult: Gallstone pancreatitis Referring Physician: [facility]  [name] HPI: This is a 68 year old female with a PMH of gallstone pancreatitis, cholelithiasis, obesity, afib with RVR, and hypothyroidism who is admitted for abdominal pain, nausea, and vomiting.  Her symptoms started last Saturday and it has been ongoing.  As a result of her pain she presented to the ER.  At the outset, i.e., 5 days ago, of her pain she was evaluated in [facility] and she was told that she need to have a cholecystectomy.  Apparently there was some insurance issue and now she is admitted with worsening pain.  No new imaging during this admission, however, her liver enzymes were elevated in an obstructive pattern.  Overnight her bilirubin has normalized and her liver enzymes have markedly declined.  She was evaluated by Surgery and the plan is to perform a cholecystectomy.  Currently her abdominal pain has resolved.  No further use of dilaudid in over 12 hours.  Past Medical History  Diagnosis Date  . Obesity   . Diverticulosis   . Periumbilical hernia   . Hypothyroidism   . Acute cholecystitis 12/06/2012  . Atrial fibrillation with RVR 12/06/2012  . Pancreatitis, acute 12/04/2012    History reviewed. No pertinent past surgical history.  No family history on file.  Social History:  reports that she has never smoked. She does not have any smokeless tobacco history on file. She reports that she does not drink alcohol or use illicit drugs.  Allergies:  Allergies  Allergen Reactions  . Codeine Itching  . Compazine [Prochlorperazine Edisylate]   . Prednisone Nausea And Vomiting  . Vicodin [Hydrocodone-Acetaminophen] Itching    Medications:  Scheduled: . [COMPLETED] sodium chloride   Intravenous STAT  . [START ON 03/25/2014] cefOXitin  2 g Intravenous On Call to OR  . heparin  5,000 Units Subcutaneous 3 times per day  . levothyroxine  100 mcg Oral QAC breakfast   . pantoprazole (PROTONIX) IV  40 mg Intravenous QHS  . sodium chloride  1,000 mL Intravenous Once  . sodium chloride  1,000 mL Intravenous Once  . sodium chloride  1,000 mL Intravenous Once   Continuous: . sodium chloride      Results for orders placed during the hospital encounter of 03/23/14 (from the past 24 hour(s))  CBC WITH DIFFERENTIAL     Status: None   Collection Time    03/23/14  5:14 PM      Result Value Ref Range   WBC 10.1  4.0 - 10.5 K/uL   RBC 4.97  3.87 - 5.11 MIL/uL   Hemoglobin 14.2  12.0 - 15.0 g/dL   HCT 42.2  36.0 - 46.0 %   MCV 84.9  78.0 - 100.0 fL   MCH 28.6  26.0 - 34.0 pg   MCHC 33.6  30.0 - 36.0 g/dL   RDW 14.1  11.5 - 15.5 %   Platelets 259  150 - 400 K/uL   Neutrophils Relative % 69  43 - 77 %   Neutro Abs 6.9  1.7 - 7.7 K/uL   Lymphocytes Relative 20  12 - 46 %   Lymphs Abs 2.0  0.7 - 4.0 K/uL   Monocytes Relative 9  3 - 12 %   Monocytes Absolute 0.9  0.1 - 1.0 K/uL   Eosinophils Relative 2  0 - 5 %   Eosinophils Absolute 0.2  0.0 - 0.7 K/uL   Basophils Relative 0  0 - 1 %   Basophils Absolute 0.0  0.0 - 0.1 K/uL  COMPREHENSIVE METABOLIC PANEL     Status: Abnormal   Collection Time    03/23/14  5:14 PM      Result Value Ref Range   Sodium 138  137 - 147 mEq/L   Potassium 4.2  3.7 - 5.3 mEq/L   Chloride 98  96 - 112 mEq/L   CO2 25  19 - 32 mEq/L   Glucose, Bld 97  70 - 99 mg/dL   BUN 10  6 - 23 mg/dL   Creatinine, Ser 0.72  0.50 - 1.10 mg/dL   Calcium 9.5  8.4 - 10.5 mg/dL   Total Protein 7.9  6.0 - 8.3 g/dL   Albumin 3.4 (*) 3.5 - 5.2 g/dL   AST 216 (*) 0 - 37 U/L   ALT 118 (*) 0 - 35 U/L   Alkaline Phosphatase 187 (*) 39 - 117 U/L   Total Bilirubin 2.3 (*) 0.3 - 1.2 mg/dL   GFR calc non Af Amer 88 (*) >90 mL/min   GFR calc Af Amer >90  >90 mL/min  LIPASE, BLOOD     Status: Abnormal   Collection Time    03/23/14  5:14 PM      Result Value Ref Range   Lipase >3000 (*) 11 - 59 U/L  SURGICAL PCR SCREEN     Status: None    Collection Time    03/24/14  4:29 AM      Result Value Ref Range   MRSA, PCR NEGATIVE  NEGATIVE   Staphylococcus aureus NEGATIVE  NEGATIVE  CBC     Status: None   Collection Time    03/24/14  7:10 AM      Result Value Ref Range   WBC 7.9  4.0 - 10.5 K/uL   RBC 4.37  3.87 - 5.11 MIL/uL   Hemoglobin 12.1  12.0 - 15.0 g/dL   HCT 37.7  36.0 - 46.0 %   MCV 86.3  78.0 - 100.0 fL   MCH 27.7  26.0 - 34.0 pg   MCHC 32.1  30.0 - 36.0 g/dL   RDW 14.2  11.5 - 15.5 %   Platelets 200  150 - 400 K/uL  COMPREHENSIVE METABOLIC PANEL     Status: Abnormal   Collection Time    03/24/14  7:10 AM      Result Value Ref Range   Sodium 137  137 - 147 mEq/L   Potassium 3.9  3.7 - 5.3 mEq/L   Chloride 103  96 - 112 mEq/L   CO2 22  19 - 32 mEq/L   Glucose, Bld 77  70 - 99 mg/dL   BUN 8  6 - 23 mg/dL   Creatinine, Ser 0.64  0.50 - 1.10 mg/dL   Calcium 8.2 (*) 8.4 - 10.5 mg/dL   Total Protein 6.4  6.0 - 8.3 g/dL   Albumin 2.8 (*) 3.5 - 5.2 g/dL   AST 100 (*) 0 - 37 U/L   ALT 86 (*) 0 - 35 U/L   Alkaline Phosphatase 159 (*) 39 - 117 U/L   Total Bilirubin 0.6  0.3 - 1.2 mg/dL   GFR calc non Af Amer >90  >90 mL/min   GFR calc Af Amer >90  >90 mL/min  URINALYSIS, ROUTINE W REFLEX MICROSCOPIC     Status: Abnormal   Collection Time    03/24/14  9:10 AM      Result Value  Ref Range   Color, Urine AMBER (*) YELLOW   APPearance CLEAR  CLEAR   Specific Gravity, Urine 1.020  1.005 - 1.030   pH 5.5  5.0 - 8.0   Glucose, UA NEGATIVE  NEGATIVE mg/dL   Hgb urine dipstick SMALL (*) NEGATIVE   Bilirubin Urine SMALL (*) NEGATIVE   Ketones, ur 15 (*) NEGATIVE mg/dL   Protein, ur NEGATIVE  NEGATIVE mg/dL   Urobilinogen, UA 1.0  0.0 - 1.0 mg/dL   Nitrite NEGATIVE  NEGATIVE   Leukocytes, UA NEGATIVE  NEGATIVE  URINE MICROSCOPIC-ADD ON     Status: Abnormal   Collection Time    03/24/14  9:10 AM      Result Value Ref Range   Squamous Epithelial / LPF FEW (*) RARE   WBC, UA 0-2  <3 WBC/hpf   Bacteria, UA FEW (*)  RARE   Urine-Other MUCOUS PRESENT       US Abdomen Limited Ruq  03/23/2014   CLINICAL DATA:  Abdominal pain.  EXAM: US ABDOMEN LIMITED - RIGHT UPPER QUADRANT  COMPARISON:  12/04/2012  FINDINGS: Gallbladder:  Multiple echogenic stones are identified within the gallbladder. These measure up to 3 mm. No gallbladder wall thickening or pericholecystic fluid. Positive sonographic Murphy's sign.  Common bile duct:  Diameter: Increased in caliber measuring 9.8 mm.  Liver:  Diffuse increased parenchymal echogenicity. No focal lesion identified.  IMPRESSION: 1. Gallstones and positive sonographic Murphy's side. Cannot rule out acute cholecystitis. 2. Increase caliber of the common bile duct. If there is a clinical concern for choledocholithiasis then an MRCP may be helpful for further assessment.   Electronically Signed   By: Kerby Moors M.D.   On: 03/23/2014 19:07    ROS:  As stated above in the HPI otherwise negative.  Blood pressure 138/77, pulse 63, temperature 97.5 F (36.4 C), temperature source Oral, resp. rate 18, height 5\' 2"  (1.575 m), weight 288 lb 14.4 oz (131.044 kg), SpO2 95.00%.    PE: Gen: NAD, Alert and Oriented HEENT:  [place]/AT, EOMI Neck: Supple, no LAD Lungs: CTA Bilaterally CV: RRR without M/G/R ABM: Soft, NTND, +BS Ext: No C/C/E  Assessment/Plan: 1) Gallstone pancreatitis. 2) Cholelithiasis.   With the rapid drop in her bilirubin, liver enzymes, and pain I believe she passed the stone.  I think it will be prudent for her to undergo a lap chole and if the IOC is positive an ERCP can be performed.  Plan: 1) Lap chole per Surgery.   Beryle Beams 03/24/2014, 2:16 PM

## 2014-03-25 ENCOUNTER — Encounter (HOSPITAL_COMMUNITY): Payer: Medicare HMO | Admitting: Anesthesiology

## 2014-03-25 ENCOUNTER — Encounter (HOSPITAL_COMMUNITY): Payer: Self-pay | Admitting: Anesthesiology

## 2014-03-25 ENCOUNTER — Encounter (HOSPITAL_COMMUNITY)
Admission: EM | Disposition: A | Discharge status: Home / Self Care | Payer: Self-pay | Source: Home / Self Care | Attending: Internal Medicine

## 2014-03-25 ENCOUNTER — Inpatient Hospital Stay (HOSPITAL_COMMUNITY): Payer: Medicare HMO

## 2014-03-25 ENCOUNTER — Inpatient Hospital Stay (HOSPITAL_COMMUNITY): Payer: Medicare HMO | Admitting: Anesthesiology

## 2014-03-25 DIAGNOSIS — K420 Umbilical hernia with obstruction, without gangrene: Secondary | ICD-10-CM

## 2014-03-25 DIAGNOSIS — K8010 Calculus of gallbladder with chronic cholecystitis without obstruction: Secondary | ICD-10-CM

## 2014-03-25 HISTORY — PX: CHOLECYSTECTOMY: SHX55

## 2014-03-25 LAB — COMPREHENSIVE METABOLIC PANEL
ALBUMIN: 2.5 g/dL — AB (ref 3.5–5.2)
ALT: 54 U/L — ABNORMAL HIGH (ref 0–35)
AST: 37 U/L (ref 0–37)
Alkaline Phosphatase: 128 U/L — ABNORMAL HIGH (ref 39–117)
BUN: 4 mg/dL — ABNORMAL LOW (ref 6–23)
CO2: 23 mEq/L (ref 19–32)
CREATININE: 0.59 mg/dL (ref 0.50–1.10)
Calcium: 8.1 mg/dL — ABNORMAL LOW (ref 8.4–10.5)
Chloride: 106 mEq/L (ref 96–112)
GFR calc Af Amer: >90 mL/min (ref >90)
GFR calc non Af Amer: >90 mL/min (ref >90)
Glucose, Bld: 91 mg/dL (ref 70–99)
Potassium: 3.7 mEq/L (ref 3.7–5.3)
Sodium: 140 mEq/L (ref 137–147)
TOTAL PROTEIN: 6 g/dL (ref 6.0–8.3)
Total Bilirubin: 0.4 mg/dL (ref 0.3–1.2)

## 2014-03-25 LAB — CBC
HEMATOCRIT: 38.8 % (ref 36.0–46.0)
HEMOGLOBIN: 12.5 g/dL (ref 12.0–15.0)
MCH: 28 pg (ref 26.0–34.0)
MCHC: 32.2 g/dL (ref 30.0–36.0)
MCV: 87 fL (ref 78.0–100.0)
Platelets: 187 10*3/uL (ref 150–400)
RBC: 4.46 MIL/uL (ref 3.87–5.11)
RDW: 14.3 % (ref 11.5–15.5)
WBC: 14.9 10*3/uL — ABNORMAL HIGH (ref 4.0–10.5)

## 2014-03-25 LAB — LIPASE, BLOOD: LIPASE: 81 U/L — AB (ref 11–59)

## 2014-03-25 SURGERY — LAPAROSCOPIC CHOLECYSTECTOMY WITH INTRAOPERATIVE CHOLANGIOGRAM
Anesthesia: General | Site: Abdomen

## 2014-03-25 MED ORDER — PROMETHAZINE HCL 25 MG/ML IJ SOLN
6.2500 mg | INTRAMUSCULAR | Status: DC | PRN
  Administered 2014-03-25: 6.25 mg via INTRAVENOUS

## 2014-03-25 MED ORDER — ONDANSETRON HCL 4 MG/2ML IJ SOLN
INTRAMUSCULAR | Status: AC
  Filled 2014-03-25: qty 2

## 2014-03-25 MED ORDER — NEOSTIGMINE METHYLSULFATE 10 MG/10ML IV SOLN
INTRAVENOUS | Status: DC | PRN
  Administered 2014-03-25: 4 mg via INTRAVENOUS

## 2014-03-25 MED ORDER — HYDROMORPHONE HCL PF 1 MG/ML IJ SOLN: 2.0000 mg | INTRAMUSCULAR | Status: DC | PRN

## 2014-03-25 MED ORDER — ROCURONIUM BROMIDE 100 MG/10ML IV SOLN
INTRAVENOUS | Status: DC | PRN
  Administered 2014-03-25: 40 mg via INTRAVENOUS

## 2014-03-25 MED ORDER — DEXTROSE 5 % IV SOLN
3.0000 g | INTRAVENOUS | Status: DC | PRN
  Administered 2014-03-25: 3 g via INTRAVENOUS

## 2014-03-25 MED ORDER — OXYCODONE HCL 5 MG/5ML PO SOLN: 5.0000 mg | Freq: Once | ORAL | Status: AC | PRN

## 2014-03-25 MED ORDER — 0.9 % SODIUM CHLORIDE (POUR BTL) OPTIME
TOPICAL | Status: DC | PRN
  Administered 2014-03-25: 1000 mL

## 2014-03-25 MED ORDER — SODIUM CHLORIDE 0.9 % IJ SOLN: 9.0000 mL | INTRAMUSCULAR | Status: DC | PRN

## 2014-03-25 MED ORDER — HYDROMORPHONE HCL PF 1 MG/ML IJ SOLN
INTRAMUSCULAR | Status: AC
  Administered 2014-03-25: 2 mg via INTRAVENOUS
  Filled 2014-03-25: qty 2

## 2014-03-25 MED ORDER — BUPIVACAINE HCL (PF) 0.25 % IJ SOLN
INTRAMUSCULAR | Status: AC
  Filled 2014-03-25: qty 30

## 2014-03-25 MED ORDER — NEOSTIGMINE METHYLSULFATE 10 MG/10ML IV SOLN
INTRAVENOUS | Status: AC
  Filled 2014-03-25: qty 1

## 2014-03-25 MED ORDER — GLYCOPYRROLATE 0.2 MG/ML IJ SOLN
INTRAMUSCULAR | Status: DC | PRN
  Administered 2014-03-25: .7 mg via INTRAVENOUS

## 2014-03-25 MED ORDER — MORPHINE SULFATE (PF) 1 MG/ML IV SOLN
INTRAVENOUS | Status: DC
  Administered 2014-03-25: 19:00:00 via INTRAVENOUS
  Filled 2014-03-25 (×2): qty 25

## 2014-03-25 MED ORDER — DIPHENHYDRAMINE HCL 12.5 MG/5ML PO ELIX
12.5000 mg | ORAL_SOLUTION | Freq: Four times a day (QID) | ORAL | Status: DC | PRN
  Filled 2014-03-25: qty 5

## 2014-03-25 MED ORDER — LACTATED RINGERS IV SOLN
INTRAVENOUS | Status: DC | PRN
  Administered 2014-03-25 (×2): via INTRAVENOUS

## 2014-03-25 MED ORDER — ONDANSETRON HCL 4 MG/2ML IJ SOLN: 4.0000 mg | Freq: Four times a day (QID) | INTRAMUSCULAR | Status: DC | PRN

## 2014-03-25 MED ORDER — FENTANYL CITRATE 0.05 MG/ML IJ SOLN
INTRAMUSCULAR | Status: DC | PRN
  Administered 2014-03-25: 50 ug via INTRAVENOUS
  Administered 2014-03-25 (×2): 100 ug via INTRAVENOUS

## 2014-03-25 MED ORDER — PROMETHAZINE HCL 25 MG/ML IJ SOLN
INTRAMUSCULAR | Status: AC
  Filled 2014-03-25: qty 1

## 2014-03-25 MED ORDER — OXYCODONE HCL 5 MG PO TABS
ORAL_TABLET | ORAL | Status: AC
  Filled 2014-03-25: qty 1

## 2014-03-25 MED ORDER — DEXAMETHASONE SODIUM PHOSPHATE 4 MG/ML IJ SOLN
INTRAMUSCULAR | Status: DC | PRN
  Administered 2014-03-25: 8 mg via INTRAVENOUS

## 2014-03-25 MED ORDER — PROPOFOL 10 MG/ML IV BOLUS
INTRAVENOUS | Status: DC | PRN
  Administered 2014-03-25: 150 mg via INTRAVENOUS

## 2014-03-25 MED ORDER — PROPOFOL 10 MG/ML IV BOLUS
INTRAVENOUS | Status: AC
  Filled 2014-03-25: qty 20

## 2014-03-25 MED ORDER — HYDROMORPHONE HCL PF 1 MG/ML IJ SOLN
0.2500 mg | INTRAMUSCULAR | Status: DC | PRN
  Administered 2014-03-25 (×3): 0.5 mg via INTRAVENOUS

## 2014-03-25 MED ORDER — MIDAZOLAM HCL 2 MG/2ML IJ SOLN
INTRAMUSCULAR | Status: AC
  Filled 2014-03-25: qty 2

## 2014-03-25 MED ORDER — TRAMADOL HCL 50 MG PO TABS
50.0000 mg | ORAL_TABLET | Freq: Four times a day (QID) | ORAL | Status: DC | PRN
  Administered 2014-03-25 – 2014-03-26 (×2): 50 mg via ORAL
  Filled 2014-03-25 (×2): qty 1

## 2014-03-25 MED ORDER — HYDROMORPHONE HCL PF 1 MG/ML IJ SOLN
INTRAMUSCULAR | Status: AC
  Administered 2014-03-25: 0.5 mg via INTRAVENOUS
  Filled 2014-03-25: qty 1

## 2014-03-25 MED ORDER — FENTANYL CITRATE 0.05 MG/ML IJ SOLN
INTRAMUSCULAR | Status: AC
  Filled 2014-03-25: qty 5

## 2014-03-25 MED ORDER — OXYCODONE HCL 5 MG PO TABS
5.0000 mg | ORAL_TABLET | Freq: Once | ORAL | Status: AC | PRN
  Administered 2014-03-25: 5 mg via ORAL

## 2014-03-25 MED ORDER — EPHEDRINE SULFATE 50 MG/ML IJ SOLN
INTRAMUSCULAR | Status: DC | PRN
  Administered 2014-03-25: 10 mg via INTRAVENOUS

## 2014-03-25 MED ORDER — MEPERIDINE HCL 25 MG/ML IJ SOLN: 6.2500 mg | INTRAMUSCULAR | Status: DC | PRN

## 2014-03-25 MED ORDER — ONDANSETRON HCL 4 MG/2ML IJ SOLN
INTRAMUSCULAR | Status: DC | PRN
  Administered 2014-03-25: 4 mg via INTRAVENOUS

## 2014-03-25 MED ORDER — HYDROMORPHONE HCL PF 1 MG/ML IJ SOLN
2.0000 mg | Freq: Once | INTRAMUSCULAR | Status: AC
  Administered 2014-03-25: 2 mg via INTRAVENOUS

## 2014-03-25 MED ORDER — EPHEDRINE SULFATE 50 MG/ML IJ SOLN
INTRAMUSCULAR | Status: AC
  Filled 2014-03-25: qty 1

## 2014-03-25 MED ORDER — ENOXAPARIN SODIUM 40 MG/0.4ML ~~LOC~~ SOLN
40.0000 mg | SUBCUTANEOUS | Status: DC
  Administered 2014-03-26 – 2014-03-28 (×3): 40 mg via SUBCUTANEOUS
  Filled 2014-03-25 (×4): qty 0.4

## 2014-03-25 MED ORDER — MIDAZOLAM HCL 5 MG/5ML IJ SOLN
INTRAMUSCULAR | Status: DC | PRN
  Administered 2014-03-25: 2 mg via INTRAVENOUS

## 2014-03-25 MED ORDER — LIDOCAINE HCL (CARDIAC) 20 MG/ML IV SOLN
INTRAVENOUS | Status: AC
  Filled 2014-03-25: qty 5

## 2014-03-25 MED ORDER — ONDANSETRON HCL 4 MG/2ML IJ SOLN
4.0000 mg | Freq: Four times a day (QID) | INTRAMUSCULAR | Status: DC | PRN
  Administered 2014-03-25 – 2014-03-28 (×4): 4 mg via INTRAVENOUS
  Filled 2014-03-25 (×5): qty 2

## 2014-03-25 MED ORDER — ONDANSETRON HCL 4 MG PO TABS
4.0000 mg | ORAL_TABLET | Freq: Four times a day (QID) | ORAL | Status: DC | PRN
  Administered 2014-03-28: 4 mg via ORAL
  Filled 2014-03-25: qty 1

## 2014-03-25 MED ORDER — HYDROMORPHONE HCL PF 1 MG/ML IJ SOLN: 1.0000 mg | INTRAMUSCULAR | Status: DC | PRN

## 2014-03-25 MED ORDER — NALOXONE HCL 0.4 MG/ML IJ SOLN: 0.4000 mg | INTRAMUSCULAR | Status: DC | PRN

## 2014-03-25 MED ORDER — LIDOCAINE HCL (CARDIAC) 20 MG/ML IV SOLN
INTRAVENOUS | Status: DC | PRN
  Administered 2014-03-25: 20 mg via INTRAVENOUS

## 2014-03-25 MED ORDER — CEFAZOLIN SODIUM 1-5 GM-% IV SOLN
INTRAVENOUS | Status: AC
  Filled 2014-03-25: qty 50

## 2014-03-25 MED ORDER — SODIUM CHLORIDE 0.9 % IR SOLN
Status: DC | PRN
Start: 2014-03-25 — End: 2014-03-25
  Administered 2014-03-25 (×2): 1

## 2014-03-25 MED ORDER — HYDROMORPHONE HCL PF 1 MG/ML IJ SOLN
0.5000 mg | INTRAMUSCULAR | Status: DC | PRN
  Administered 2014-03-25: 1 mg via INTRAVENOUS
  Filled 2014-03-25: qty 1

## 2014-03-25 MED ORDER — STERILE WATER FOR INJECTION IJ SOLN
INTRAMUSCULAR | Status: AC
  Filled 2014-03-25: qty 10

## 2014-03-25 MED ORDER — IOHEXOL 300 MG/ML  SOLN
INTRAMUSCULAR | Status: DC | PRN
  Administered 2014-03-25: 5 mL via INTRAVENOUS

## 2014-03-25 MED ORDER — GLYCOPYRROLATE 0.2 MG/ML IJ SOLN
INTRAMUSCULAR | Status: AC
  Filled 2014-03-25: qty 3

## 2014-03-25 MED ORDER — BUPIVACAINE HCL 0.25 % IJ SOLN
INTRAMUSCULAR | Status: DC | PRN
  Administered 2014-03-25: 12 mL

## 2014-03-25 MED ORDER — DIPHENHYDRAMINE HCL 50 MG/ML IJ SOLN: 12.5000 mg | Freq: Four times a day (QID) | INTRAMUSCULAR | Status: DC | PRN

## 2014-03-25 MED ORDER — LORAZEPAM 0.5 MG PO TABS
0.5000 mg | ORAL_TABLET | Freq: Four times a day (QID) | ORAL | Status: DC | PRN
  Administered 2014-03-25 – 2014-03-28 (×4): 0.5 mg via ORAL
  Filled 2014-03-25 (×4): qty 1

## 2014-03-25 SURGICAL SUPPLY — 52 items
APL SKNCLS STERI-STRIP NONHPOA (GAUZE/BANDAGES/DRESSINGS) ×1
APPLIER CLIP 5 13 M/L LIGAMAX5 (MISCELLANEOUS)
APR CLP MED LRG 5 ANG JAW (MISCELLANEOUS)
BAG SPEC RTRVL 10 TROC 200 (ENDOMECHANICALS) ×1
BAG SPEC RTRVL LRG 6X4 10 (ENDOMECHANICALS)
BENZOIN TINCTURE PRP APPL 2/3 (GAUZE/BANDAGES/DRESSINGS) ×2 IMPLANT
CANISTER SUCTION 2500CC (MISCELLANEOUS) ×2 IMPLANT
CHLORAPREP W/TINT 26ML (MISCELLANEOUS) ×2 IMPLANT
CLIP APPLIE 5 13 M/L LIGAMAX5 (MISCELLANEOUS) IMPLANT
CLIP LIGATING HEMO O LOK GREEN (MISCELLANEOUS) ×2 IMPLANT
COVER MAYO STAND STRL (DRAPES) ×2 IMPLANT
COVER SURGICAL LIGHT HANDLE (MISCELLANEOUS) ×2 IMPLANT
COVER TRANSDUCER ULTRASND (DRAPES) IMPLANT
DEVICE TROCAR PUNCTURE CLOSURE (ENDOMECHANICALS) ×2 IMPLANT
DRAPE C-ARM 42X72 X-RAY (DRAPES) ×2 IMPLANT
DRAPE UTILITY 15X26 W/TAPE STR (DRAPE) ×4 IMPLANT
ELECT REM PT RETURN 9FT ADLT (ELECTROSURGICAL) ×2
ELECTRODE REM PT RTRN 9FT ADLT (ELECTROSURGICAL) ×1 IMPLANT
GAUZE SPONGE 2X2 8PLY STRL LF (GAUZE/BANDAGES/DRESSINGS) ×1 IMPLANT
GLOVE BIO SURGEON STRL SZ7.5 (GLOVE) ×2 IMPLANT
GLOVE BIOGEL PI IND STRL 7.0 (GLOVE) IMPLANT
GLOVE BIOGEL PI IND STRL 7.5 (GLOVE) IMPLANT
GLOVE BIOGEL PI INDICATOR 7.0 (GLOVE) ×1
GLOVE BIOGEL PI INDICATOR 7.5 (GLOVE) ×1
GLOVE ECLIPSE 7.5 STRL STRAW (GLOVE) ×1 IMPLANT
GLOVE SURG SS PI 7.0 STRL IVOR (GLOVE) ×1 IMPLANT
GOWN STRL REUS W/ TWL LRG LVL3 (GOWN DISPOSABLE) ×3 IMPLANT
GOWN STRL REUS W/ TWL XL LVL3 (GOWN DISPOSABLE) ×1 IMPLANT
GOWN STRL REUS W/TWL LRG LVL3 (GOWN DISPOSABLE) ×6
GOWN STRL REUS W/TWL XL LVL3 (GOWN DISPOSABLE) ×2
IV CATH 14GX2 1/4 (CATHETERS) ×2 IMPLANT
KIT BASIN OR (CUSTOM PROCEDURE TRAY) ×2 IMPLANT
KIT ROOM TURNOVER OR (KITS) ×2 IMPLANT
NDL INSUFFLATION 14GA 120MM (NEEDLE) ×1 IMPLANT
NEEDLE INSUFFLATION 14GA 120MM (NEEDLE) ×2 IMPLANT
NS IRRIG 1000ML POUR BTL (IV SOLUTION) ×2 IMPLANT
PAD ARMBOARD 7.5X6 YLW CONV (MISCELLANEOUS) ×4 IMPLANT
POUCH RETRIEVAL ECOSAC 10 (ENDOMECHANICALS) IMPLANT
POUCH RETRIEVAL ECOSAC 10MM (ENDOMECHANICALS) ×1
POUCH SPECIMEN RETRIEVAL 10MM (ENDOMECHANICALS) IMPLANT
SCISSORS LAP 5X35 DISP (ENDOMECHANICALS) ×2 IMPLANT
SET CHOLANGIOGRAPHY FRANKLIN (SET/KITS/TRAYS/PACK) ×2 IMPLANT
SET IRRIG TUBING LAPAROSCOPIC (IRRIGATION / IRRIGATOR) ×2 IMPLANT
SLEEVE ENDOPATH XCEL 5M (ENDOMECHANICALS) ×2 IMPLANT
SPECIMEN JAR SMALL (MISCELLANEOUS) ×2 IMPLANT
SPONGE GAUZE 2X2 STER 10/PKG (GAUZE/BANDAGES/DRESSINGS) ×1
SUT MNCRL AB 3-0 PS2 18 (SUTURE) ×2 IMPLANT
TOWEL OR 17X24 6PK STRL BLUE (TOWEL DISPOSABLE) ×2 IMPLANT
TOWEL OR 17X26 10 PK STRL BLUE (TOWEL DISPOSABLE) ×2 IMPLANT
TRAY LAPAROSCOPIC (CUSTOM PROCEDURE TRAY) ×2 IMPLANT
TROCAR XCEL NON-BLD 11X100MML (ENDOMECHANICALS) ×2 IMPLANT
TROCAR XCEL NON-BLD 5MMX100MML (ENDOMECHANICALS) ×2 IMPLANT

## 2014-03-25 NOTE — Progress Notes (Signed)
Pt lethargic but arousable, SPO2 89% on 4LNC, Morphine PCA running per orders. Pt unable to self administer PCA.  Dr. Kathline Magic notified of concerns for patients LOC and oxygen saturation. MD at bedside to assess patient during shift change. VS obtained. PCA stopped per Dr. Rogue Bussing. Will continue to monitor patient and treat per MD orders.

## 2014-03-25 NOTE — Progress Notes (Signed)
[facility] PROGRESS NOTE  CANDELA KRUL EPP:[id number] DOB: [birth year] DOA: 03/23/2014 PCP: No primary provider on file.  HPI/Subjective: Patient returned to her room from surgery at approximately 12pm.   At approximately 1330 she was very tired and in significant abdominal discomfort.  Pain significantly better at 1430 after pain med administration.   She was drifting in and out of sleep during interview.   Assessment/Plan:  Gallstone Pancreatitis / Cholelithiasis / ?Choledocholithiasis - US Abdomen: Gallstones, dilated common bile duct 9.76mm.   - No infection suspected.  Afebrile.  No WBC elevation.  No gallbladder wall thickening or pericholecystic fluid - Elevated AST/ALT/AP improved since admission.  T.Billi now WNL.  Lipase 81 (03/25/14) (>3000) on admission.   - Aggressive treatment with IVFs.   - NPO advanced to clears till midnight 03/24/14.  NPO after midnight. - Nausea and pain control - Improvement in LFTs consistent with passing a CBD stone. - Surgery and GI consulted.  Cholecystectomy advised and performed 03/25/14 - IOC performed during surgery (see below) filling defects suspicious for CBD stones versus air bubbles. - Await gastroenterology recommendation.  Umbilical Hernia with Chronic Incarceration - Asymptomatic.  Repaired during cholecystectomy  Acute Anxiety - Due to the possibility of needing surgery.  Also anxious about blood draws. - Ativan 0.5 mg BID PRN  Disposition: Stable  DVT Prophylaxis: Heparin  Code Status: Full Family Communication: None Disposition Plan: Home   Consultants:  Surgery, Gastroenterology  Procedures:  None  Antibiotics:  None   Objective: Filed Vitals:   03/25/14 1151  BP: 135/76  Pulse: 65  Temp: 97.8 F (36.6 C)  Resp: 18    Intake/Output Summary (Last 24 hours) at 03/25/14 1305 Last data filed at 03/25/14 1100  Gross per 24 hour  Intake 4036.67 ml  Output    135 ml  Net 3901.67 ml   Filed  Weights   03/23/14 2104  Weight: 131.044 kg (288 lb 14.4 oz)    Exam:   Physical Exam General: In and out of sleep during interview - recently given pain meds.  Uncomfortable with movement.  NAD HEENT: Normocephalic/Atraumatic, conj. clear, anicteric sclera Neck: Excess adipose tissue. Supple, full ROM Lungs: Breathing shallow but unlabored, CTA b/l anterior/posterior CV: RRR, no m/g/r appreciated Abdomen: Protuberant. Mildly distended, infrequent BS, soft, tender to light palpation RUQ/epigastric.  Incisions bandaged.  Extremities: No edema, warm to touch Skin: No rash or lesions Neuro: No focal deficits   Data Reviewed: Basic Metabolic Panel:  Recent Labs Lab 03/23/14 1714 03/24/14 0710 03/25/14 0740  NA 138 137 140  K 4.2 3.9 3.7  CL 98 103 106  CO2 25 22 23   GLUCOSE 97 77 91  BUN 10 8 4*  CREATININE 0.72 0.64 0.59  CALCIUM 9.5 8.2* 8.1*   Liver Function Tests:  Recent Labs Lab 03/23/14 1714 03/24/14 0710 03/25/14 0740  AST 216* 100* 37  ALT 118* 86* 54*  ALKPHOS 187* 159* 128*  BILITOT 2.3* 0.6 0.4  PROT 7.9 6.4 6.0  ALBUMIN 3.4* 2.8* 2.5*    Recent Labs Lab 03/23/14 1714 03/24/14 1452 03/25/14 0740  LIPASE >3000* 400* 81*   CBC:  Recent Labs Lab 03/23/14 1714 03/24/14 0710  WBC 10.1 7.9  NEUTROABS 6.9  --   HGB 14.2 12.1  HCT 42.2 37.7  MCV 84.9 86.3  PLT 259 200    Recent Results (from the past 240 hour(s))  SURGICAL PCR SCREEN     Status: None   Collection Time  03/24/14  4:29 AM      Result Value Ref Range Status   MRSA, PCR NEGATIVE  NEGATIVE Final   Staphylococcus aureus NEGATIVE  NEGATIVE Final   Comment:            The Xpert SA Assay (FDA     approved for NASAL specimens     in patients over 66 years of age),     is one component of     a comprehensive surveillance     program.  Test performance has     been validated by [employer] for patients greater     than or equal to 13 year old.     It is not intended      to diagnose infection nor to     guide or monitor treatment.     Studies: Dg Cholangiogram Operative  03/25/2014   CLINICAL DATA:  Laparoscopic cholecystectomy for gallstones  EXAM: INTRAOPERATIVE CHOLANGIOGRAM  FLUOROSCOPY TIME:  28 seconds  COMPARISON:  Abdominal ultrasound - 03/23/2014; abdominal CT -03/19/2014; nuclear medicine HIDA scan - 03/20/2014  FINDINGS: Intraoperative angiographic images of the right upper abdominal quadrant during laparoscopic cholecystectomy are provided for review.  Surgical clips overlie the expected location of the gallbladder fossa.  Initial contrast injection demonstrates either opacification of the gallbladder neck or extravasation of contrast about the central aspect of the gallbladder fossa.  Subsequent contrast injection demonstrates apparent opacification of the central aspect of the right posterior biliary duct.  There is passage of contrast through the apparent right posterior biliary duct with filling of a non dilated common bile duct. There is passage of contrast though the CBD and into the descending portion of the duodenum.  There are multiple apparent filling defects within the moderately dilated common bile duct which while possibly representative of air bubbles are worrisome for choledocholithiasis.  There is minimal reflux of injected contrast into the common hepatic duct and central aspect of the non dilated intrahepatic biliary system.  IMPRESSION: 1. 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 2. Multiple nonocclusive filling defects are seen within the moderately dilated common bile duct - while possibly represent of air bubbles, are worrisome for choledocholithiasis. Clinical correlation is advised. Further evaluation could be performed with ERCP as clinically indicated. These results will be called to the ordering clinician or representative by the Radiologist Assistant, and communication documented in the  PACS Dashboard.   Electronically Signed   By: Sandi Mariscal M.D.   On: 03/25/2014 11:10   US Abdomen Limited Ruq  03/23/2014   CLINICAL DATA:  Abdominal pain.  EXAM: US ABDOMEN LIMITED - RIGHT UPPER QUADRANT  COMPARISON:  12/04/2012  FINDINGS: Gallbladder:  Multiple echogenic stones are identified within the gallbladder. These measure up to 3 mm. No gallbladder wall thickening or pericholecystic fluid. Positive sonographic Murphy's sign.  Common bile duct:  Diameter: Increased in caliber measuring 9.8 mm.  Liver:  Diffuse increased parenchymal echogenicity. No focal lesion identified.  IMPRESSION: 1. Gallstones and positive sonographic Murphy's side. Cannot rule out acute cholecystitis. 2. Increase caliber of the common bile duct. If there is a clinical concern for choledocholithiasis then an MRCP may be helpful for further assessment.   Electronically Signed   By: Kerby Moors M.D.   On: 03/23/2014 19:07    Scheduled Meds: . [START ON 03/26/2014] enoxaparin (LOVENOX) injection  40 mg Subcutaneous Q24H  . levothyroxine  100 mcg Oral QAC breakfast  .  oxyCODONE      . pantoprazole (PROTONIX) IV  40 mg Intravenous QHS  . promethazine      . sodium chloride  1,000 mL Intravenous Once  . sodium chloride  1,000 mL Intravenous Once  . sodium chloride  1,000 mL Intravenous Once   Continuous Infusions: . sodium chloride 200 mL/hr at 03/25/14 0247    Principal Problem:   Pancreatitis, acute Active Problems:   Acute cholecystitis   Gallstone pancreatitis   Anxiety state, unspecified   Time spent: [place], Student-PA   [facility]  If 7PM-7AM, please contact night-coverage at [email or web address], password [facility] 03/25/2014, 1:05 PM  LOS: 2 days      Addendum  Patient seen and examined, chart and data base reviewed.  I agree with the above assessment and plan.  For full details please see Mr. Rudean Haskell, Student-PA note.  I reviewed and amended the above note as  appropriate.   Birdie Hopes, MD [facility] Pager: [phone] 03/25/2014, 3:04 PM

## 2014-03-25 NOTE — Anesthesia Preprocedure Evaluation (Addendum)
Anesthesia Evaluation  Patient identified by MRN, date of birth, ID band Patient awake    Reviewed: Allergy & Precautions, H&P , NPO status , Patient's Chart, lab work & pertinent test results, reviewed documented beta blocker date and time   History of Anesthesia Complications Negative for: history of anesthetic complications  Airway Mallampati: II TM Distance: >3 FB Neck ROM: Full    Dental  (+) Loose, Dental Advisory Given, Poor Dentition, Missing,    Pulmonary neg pulmonary ROS,  breath sounds clear to auscultation  Pulmonary exam normal       Cardiovascular - angina+ dysrhythmias Atrial Fibrillation Rhythm:Regular Rate:Normal  '14 ECHO: EF 55-60%, valves OK   Neuro/Psych Anxiety negative neurological ROS     GI/Hepatic GERD-  Medicated and Controlled,Elevated LFTs with acute cholecystitis   Endo/Other  Hypothyroidism Morbid obesity  Renal/GU negative Renal ROS     Musculoskeletal   Abdominal (+) + obese,   Peds  Hematology negative hematology ROS (+)   Anesthesia Other Findings   Reproductive/Obstetrics                      Anesthesia Physical Anesthesia Plan  ASA: III  Anesthesia Plan: General   Post-op Pain Management:    Induction: Intravenous  Airway Management Planned: Oral ETT  Additional Equipment:   Intra-op Plan:   Post-operative Plan: Extubation in OR  Informed Consent: I have reviewed the patients History and Physical, chart, labs and discussed the procedure including the risks, benefits and alternatives for the proposed anesthesia with the patient or authorized representative who has indicated his/her understanding and acceptance.   Dental advisory given  Plan Discussed with: CRNA and Surgeon  Anesthesia Plan Comments: (Plan routine monitors, GETA)        Anesthesia Quick Evaluation

## 2014-03-25 NOTE — Op Note (Signed)
03/23/2014 - 03/25/2014  10:34 AM  PATIENT:  Lauren [name]  68 y.o. female  PRE-OPERATIVE DIAGNOSIS:  Biliary pancreatitis  POST-OPERATIVE DIAGNOSIS:  Same and incarcerated umbilical hernia  PROCEDURE:  Procedure(s): LAPAROSCOPIC CHOLECYSTECTOMY WITH INTRAOPERATIVE CHOLANGIOGRAM (N/A) AND PRIMARY INCARCERATED UMBILICAL HERNIA REPAIR  SURGEON:  Surgeon(s) and Role:    * Ralene Ok, MD - Primary    ASSISTANTS: none   ANESTHESIA:   local and general  EBL:  Total I/O In: 1000 [I.V.:1000] Out: -   BLOOD ADMINISTERED:none  DRAINS: none   LOCAL MEDICATIONS USED:  BUPIVICAINE   SPECIMEN:  Source of Specimen:  gallbladder  DISPOSITION OF SPECIMEN:  PATHOLOGY  COUNTS:  YES  TOURNIQUET:  * No tourniquets in log *  DICTATION: .Dragon Dictation  Details of the procedure:  The patient was taken to the operating and placed in the supine position with bilateral SCDs in place. A time out was called and all facts were verified. A pneumoperitoneum was obtained via A Veress needle technique to a pressure of 36mm of mercury. A 47mm trochar was then placed in the right upper quadrant under visualization, and there were no injuries to any abdominal organs. A 11 mm port was then placed in the umbilical region after infiltrating with local anesthesia under direct visualization. A second and third epigastric port and right lower quadrant port placement under direct visualization, respectively. There was a small hernia seen at the umbilicus.  The omental contents were reduced laparoscopically.  The gallbladder was identified and retracted, the peritoneum was then sharply dissected from the gallbladder and this dissection was carried down to Calot's triangle. The gallbladder was identified and stripped away circumferentially and seen going into the gallbladder 360. A Cook catheter was used to perform an intraoperative cholangiogram. The cystic duct was very short.  Initally while placing the  catheter, its seemed to perforated the back wall of the cystic duct.  A Cholangiogram was shot, and the biliary radicals as well as the cystic duct and common hepatic duct were seen free of filling defects.  There seemed to be some stoned distally in the common bile duct, but contrast easily was seen in the duodenum.   1 clip were placed proximally one distally and the cystic duct transected, the clip seemed to encompass the posterior perforation of the cystic duct.  Due to the very short cystic duct only one clip was able to be place without narrowing the large common bile duct. The cystic artery was identified and 2 clips placed proximally and one distally on both the anterior and posterior branches, and transected.  We then proceeded to remove the gallbladder off the hepatic fossa with Bovie cautery.  There was a small hole on teh gallbladder and several small stones were spilled.  These were placed in the bag with the gallbladder.  A retrieval bag was then placed in the abdomen and gallbladder placed in the bag. The hepatic fossa was then reexamined and hemostasis was achieved with Bovie cautery and was excellent at the end of the case. The subhepatic fossa and perihepatic fossa was then irrigated until the effluent was clear. I reexamined the clips, there was no bile that could be seen and all clips looks secure.  The 11 mm trocar fascia was reapproximated with the Endo Close #1 Vicryl which reapproximated the umbilical hernia appropriately.  The pneumoperitoneum was evacuated and all trochars removed under direct visulalization.  The skin was then closed with 4-0 Monocryl and the skin dressed with  Steri-Strips, gauze, and tape.  The patient was awaken from general anesthesia and taken to the recovery room in stable condition.   PLAN OF CARE: Admit for overnight observation  PATIENT DISPOSITION:  PACU - hemodynamically stable.   Delay start of Pharmacological VTE agent (>24hrs) due to surgical blood loss  or risk of bleeding: yes

## 2014-03-25 NOTE — Transfer of Care (Signed)
Immediate Anesthesia Transfer of Care Note  Patient: Lauren [name]  Procedure(s) Performed: Procedure(s): LAPAROSCOPIC CHOLECYSTECTOMY WITH INTRAOPERATIVE CHOLANGIOGRAM (N/A)  Patient Location: PACU  Anesthesia Type:General  Level of Consciousness: awake, alert  and oriented  Airway & Oxygen Therapy: Patient Spontanous Breathing and Patient connected to nasal cannula oxygen  Post-op Assessment: Report given to PACU RN and Post -op Vital signs reviewed and stable  Post vital signs: Reviewed and stable  Complications: No apparent anesthesia complications

## 2014-03-25 NOTE — Anesthesia Postprocedure Evaluation (Signed)
  Anesthesia Post-op Note  Patient: Lauren [name]  Procedure(s) Performed: Procedure(s): LAPAROSCOPIC CHOLECYSTECTOMY WITH INTRAOPERATIVE CHOLANGIOGRAM (N/A)  Patient Location: PACU  Anesthesia Type:General  Level of Consciousness: awake, alert , sedated and patient cooperative  Airway and Oxygen Therapy: Patient Spontanous Breathing and Patient connected to nasal cannula oxygen  Post-op Pain: mild  Post-op Assessment: Post-op Vital signs reviewed, Patient's Cardiovascular Status Stable, Respiratory Function Stable, Patent Airway, No signs of Nausea or vomiting and Pain level controlled  Post-op Vital Signs: Reviewed and stable  Last Vitals:  Filed Vitals:   03/25/14 1119  BP: 130/65  Pulse: 68  Temp: 36.7 C  Resp: 14    Complications: No apparent anesthesia complications

## 2014-03-25 NOTE — Significant Event (Signed)
   Called by the nursing staff that patient has excruciating pain.  Review patient patient complaining about right upper quadrant abdominal pain.  Reported the pain is worse than before she goes to surgery.  Patient does have anxiety, she is on lorazepam.  Pain medication adjusted, Dilaudid increased to 2 mg, asked general surgery to evaluate postoperatively.  Birdie Hopes Pager: [phone] 03/25/2014, 4:56 PM

## 2014-03-25 NOTE — Care Management Note (Signed)
    Page 1 of 1   03/28/2014     4:13:22 PM CARE MANAGEMENT NOTE 03/28/2014  Patient:  TUWANNA, KRAUSZ   Account Number:  [id number]  Date Initiated:  03/25/2014  Documentation initiated by:  Tomi Bamberger  Subjective/Objective Assessment:   dx acute pancreatitis  admit- lives with spouse.     Action/Plan:   5/1- lap chole   Anticipated DC Date:  03/21/2014   Anticipated DC Plan:  [place]  CM consult      Choice offered to / List presented to:             Status of service:  Completed, signed off Medicare Important Message given?  NO (If response is "NO", the following Medicare IM given date fields will be blank) Date Medicare IM given:   Date Additional Medicare IM given:  03/28/2014  Discharge Disposition:  HOME/SELF CARE  Per UR Regulation:  Reviewed for med. necessity/level of care/duration of stay  If discussed at [place] of Stay Meetings, dates discussed:    Comments:  03/28/14 [place] RN,BSN [phone] patient is for dc today, NCM gave patient a list of MD's and the [facility] number to help her find a PCP, she has a MD name on her insurance card for her pcp, she will need to get this changed if she wants to see another MD.

## 2014-03-25 NOTE — Progress Notes (Signed)
Dr. Benson Norway notified of likely choledocholithiasis on IOC.  Recommended AM labs.  If elevated, to obtain GI consult for ERCP.  He otherwise will proceed with outpt ERCP.

## 2014-03-25 NOTE — Progress Notes (Signed)
[facility] progress note. Chief complaint. Oversedation. History of present illness. This 68 year old female underwent laparoscopic cholecystectomy and primary incarcerated umbilical hernia repair today. Postoperatively patient was experiencing a significant amount of pain and placed on Dilaudid. With the Dilaudid the patient appeared to become lethargic with desats and nasal cannula oxygen was initiated. Per nursing the patient appeared either lethargic from pain medication for crying out in intense pain when alert. Surgery saw the patient and felt that the abdomen appeared benign postoperatively. He discontinued the Dilaudid and place the patient on morphine PCA. Current PCA dosing is 1.5 mg within 8 mg interval and 30 mg total per 4 hours. Came to see the patient at bedside to assess her stability. Found the patient somnolent but easily arousable. Does state that she is in pain and actually appears comfortable and doses off and not stimulated. Her O2 sats. appear to be relatively stable on nasal cannula oxygen. Vital signs. Temperature 97.9, pulse 84, respiration 24, blood pressure 156/83. O2 sats 96% on 4 L nasal cannula oxygen. General appearance. Obese elderly female who is somnolent but easily arousable. There is no distress. Cardiac. Rate and rhythm regular. Lungs. Breath sounds are clear. Respirations are somewhat shallow. Breathing about 20 times per. Abdomen. Surgical wounds x4 dressings are clear no bladder discharge. Abdomen soft is diffuse pain with palpation. Impression/plan. Problem 1. Oversedation. This thought secondary to administration of prior to moderate and current morphine PCA. Patient appears clinically stable at this point and I don't believe step down has required. Have asked the nurses to withhold PCA until patient is more alert. And then restart PCA and see how she does on this dosing once the Dilaudid is out of her system. If this still appears to be too much narcotics  or nursing I asked him to call may and I will reduce the morphine PCA dosing.

## 2014-03-26 ENCOUNTER — Inpatient Hospital Stay (HOSPITAL_COMMUNITY): Payer: Medicare HMO

## 2014-03-26 LAB — URINALYSIS, ROUTINE W REFLEX MICROSCOPIC
Bilirubin Urine: NEGATIVE
GLUCOSE, UA: NEGATIVE mg/dL
Ketones, ur: NEGATIVE mg/dL
Leukocytes, UA: NEGATIVE
Nitrite: NEGATIVE
PH: 5 (ref 5.0–8.0)
Protein, ur: NEGATIVE mg/dL
Specific Gravity, Urine: 1.011 (ref 1.005–1.030)
Urobilinogen, UA: 0.2 mg/dL (ref 0.0–1.0)

## 2014-03-26 LAB — COMPREHENSIVE METABOLIC PANEL
ALT: 51 U/L — ABNORMAL HIGH (ref 0–35)
AST: 47 U/L — ABNORMAL HIGH (ref 0–37)
Albumin: 2.6 g/dL — ABNORMAL LOW (ref 3.5–5.2)
Alkaline Phosphatase: 132 U/L — ABNORMAL HIGH (ref 39–117)
BUN: 4 mg/dL — AB (ref 6–23)
CHLORIDE: 105 meq/L (ref 96–112)
CO2: 22 mEq/L (ref 19–32)
CREATININE: 0.7 mg/dL (ref 0.50–1.10)
Calcium: 8.2 mg/dL — ABNORMAL LOW (ref 8.4–10.5)
GFR calc Af Amer: >90 mL/min (ref >90)
GFR calc non Af Amer: 89 mL/min — ABNORMAL LOW (ref >90)
GLUCOSE: 136 mg/dL — AB (ref 70–99)
Potassium: 4.8 mEq/L (ref 3.7–5.3)
Sodium: 140 mEq/L (ref 137–147)
Total Bilirubin: 0.5 mg/dL (ref 0.3–1.2)
Total Protein: 6.3 g/dL (ref 6.0–8.3)

## 2014-03-26 LAB — LIPASE, BLOOD: LIPASE: 231 U/L — AB (ref 11–59)

## 2014-03-26 LAB — URINE MICROSCOPIC-ADD ON

## 2014-03-26 MED ORDER — OXYCODONE-ACETAMINOPHEN 5-325 MG PO TABS
1.0000 | ORAL_TABLET | Freq: Four times a day (QID) | ORAL | Status: DC | PRN
  Administered 2014-03-26: 1 via ORAL
  Administered 2014-03-27 – 2014-03-28 (×2): 2 via ORAL
  Filled 2014-03-26 (×2): qty 2
  Filled 2014-03-26: qty 1

## 2014-03-26 MED ORDER — FUROSEMIDE 10 MG/ML IJ SOLN
40.0000 mg | Freq: Once | INTRAMUSCULAR | Status: AC
  Administered 2014-03-26: 40 mg via INTRAVENOUS
  Filled 2014-03-26: qty 4

## 2014-03-26 NOTE — Progress Notes (Signed)
1 Day Post-Op lap chole Subjective: Abnormal IOC yesterday with concern for choledocholithiasis.  No obstruction noted.  Pt feels very weak.  Has some nausea, no vomiting.  Pain controlled when lying in bed but worsens when she gets out of bed.    Objective: Vital signs in last 24 hours: Temp:  [97.8 F (36.6 C)-98.2 F (36.8 C)] 98 F (36.7 C) (05/02 0254) Pulse Rate:  [63-95] 72 (05/02 0638) Resp:  [14-34] 20 (05/02 0638) BP: (130-157)/(60-85) 134/82 mmHg (05/02 0638) SpO2:  [94 %-99 %] 99 % (05/02 2706)   Intake/Output from previous day: 05/01 0701 - 05/02 0700 In: 5866.7 [I.V.:5866.7] Out: 285 [Urine:225; Blood:60] Intake/Output this shift:     General appearance: alert and cooperative GI: normal findings: soft, appropriately tender Incision: no significant drainage  Lab Results:   Recent Labs  03/23/14 1714 03/24/14 0710  WBC 10.1 7.9  HGB 14.2 12.1  HCT 42.2 37.7  PLT 259 200   BMET  Recent Labs  03/25/14 0740 03/26/14 0552  NA 140 140  K 3.7 4.8  CL 106 105  CO2 23 22  GLUCOSE 91 136*  BUN 4* 4*  CREATININE 0.59 0.70  CALCIUM 8.1* 8.2*   PT/INR No results found for this basename: LABPROT, INR,  in the last 72 hours ABG No results found for this basename: PHART, PCO2, PO2, HCO3,  in the last 72 hours  MEDS, Scheduled . enoxaparin (LOVENOX) injection  40 mg Subcutaneous Q24H  . levothyroxine  100 mcg Oral QAC breakfast  . pantoprazole (PROTONIX) IV  40 mg Intravenous QHS  . sodium chloride  1,000 mL Intravenous Once  . sodium chloride  1,000 mL Intravenous Once  . sodium chloride  1,000 mL Intravenous Once    Studies/Results: Dg Cholangiogram Operative  03/25/2014   CLINICAL DATA:  Laparoscopic cholecystectomy for gallstones  EXAM: INTRAOPERATIVE CHOLANGIOGRAM  FLUOROSCOPY TIME:  28 seconds  COMPARISON:  Abdominal ultrasound - 03/23/2014; abdominal CT -03/19/2014; nuclear medicine HIDA scan - 03/20/2014  FINDINGS: Intraoperative angiographic  images of the right upper abdominal quadrant during laparoscopic cholecystectomy are provided for review.  Surgical clips overlie the expected location of the gallbladder fossa.  Initial contrast injection demonstrates either opacification of the gallbladder neck or extravasation of contrast about the central aspect of the gallbladder fossa.  Subsequent contrast injection demonstrates apparent opacification of the central aspect of the right posterior biliary duct.  There is passage of contrast through the apparent right posterior biliary duct with filling of a non dilated common bile duct. There is passage of contrast though the CBD and into the descending portion of the duodenum.  There are multiple apparent filling defects within the moderately dilated common bile duct which while possibly representative of air bubbles are worrisome for choledocholithiasis.  There is minimal reflux of injected contrast into the common hepatic duct and central aspect of the non dilated intrahepatic biliary system.  IMPRESSION: 1. 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 2. Multiple nonocclusive filling defects are seen within the moderately dilated common bile duct - while possibly represent of air bubbles, are worrisome for choledocholithiasis. Clinical correlation is advised. Further evaluation could be performed with ERCP as clinically indicated. These results will be called to the ordering clinician or representative by the Radiologist Assistant, and communication documented in the PACS Dashboard.   Electronically Signed   By: Sandi Mariscal M.D.   On: 03/25/2014 11:10    Assessment: s/p Procedure(s): LAPAROSCOPIC CHOLECYSTECTOMY  WITH INTRAOPERATIVE CHOLANGIOGRAM Patient Active Problem List   Diagnosis Date Noted  . Anxiety state, unspecified 03/24/2014  . Gallstone pancreatitis 03/23/2014  . Acute cholecystitis 12/06/2012  . Atrial fibrillation with RVR 12/06/2012  .  Pancreatitis, acute 12/04/2012  . Hypothyroidism 12/04/2012  . Obesity (BMI 35.0-39.9 without comorbidity) 12/04/2012     Plan: cont clears Lipase elevated: most likely from [facility].  Will recheck in AM.  Other LFT's seem stable.  Ambulate  ERCP per GI recs   LOS: 3 days     .Rosario Adie, [facility], [place]   03/26/2014 8:55 AM

## 2014-03-26 NOTE — Progress Notes (Signed)
Unassigned patient Subjective: Patient is resting comfortably. She has her daughter in the room with her. No nausea or vomiting. Still complains of abdominal pain. Objective: Vital signs in last 24 hours: Temp:  [97.7 F (36.5 C)-98.2 F (36.8 C)] 97.7 F (36.5 C) (05/02 1524) Pulse Rate:  [72-95] 74 (05/02 1524) Resp:  [16-34] 20 (05/02 1524) BP: (134-167)/(81-85) 167/81 mmHg (05/02 1524) SpO2:  [94 %-99 %] 95 % (05/02 1524) Last BM Date: 03/24/14  Intake/Output from previous day: 05/01 0701 - 05/02 0700 In: 5866.7 [I.V.:5866.7] Out: 285 [Urine:225; Blood:60] Intake/Output this shift: Total I/O In: 0  Out: 400 [Urine:400]  General appearance: cooperative, appears stated age and no distress Resp: clear to auscultation bilaterally Cardio: regular rate and rhythm, S1, S2 normal, no murmur, click, rub or gallop GI: soft, mild diffuse tenderness on palpation with gaurding but without rebound or rigidity; bowel sounds are hypoactive; no masses,  no organomegaly  Lab Results:  Recent Labs  03/23/14 1714 03/24/14 0710  WBC 10.1 7.9  HGB 14.2 12.1  HCT 42.2 37.7  PLT 259 200   BMET  Recent Labs  03/24/14 0710 03/25/14 0740 03/26/14 0552  NA 137 140 140  K 3.9 3.7 4.8  CL 103 106 105  CO2 22 23 22   GLUCOSE 77 91 136*  BUN 8 4* 4*  CREATININE 0.64 0.59 0.70  CALCIUM 8.2* 8.1* 8.2*   LFT  Recent Labs  03/26/14 0552  PROT 6.3  ALBUMIN 2.6*  AST 47*  ALT 51*  ALKPHOS 132*  BILITOT 0.5   Studies/Results: Dg Chest 2 View  03/26/2014   CLINICAL DATA:  Shortness of breath  EXAM: CHEST  2 VIEW  COMPARISON:  03/19/2014  FINDINGS: Moderate cardiac enlargement. There are low lung volumes. Bilateral pleural effusions and bibasilar atelectasis is identified.  IMPRESSION: Low lung volumes, bilateral pleural effusions and bibasilar atelectasis.   Electronically Signed   By: Kerby Moors M.D.   On: 03/26/2014 15:43   Dg Cholangiogram Operative  03/25/2014   CLINICAL  DATA:  Laparoscopic cholecystectomy for gallstones  EXAM: INTRAOPERATIVE CHOLANGIOGRAM  FLUOROSCOPY TIME:  28 seconds  COMPARISON:  Abdominal ultrasound - 03/23/2014; abdominal CT -03/19/2014; nuclear medicine HIDA scan - 03/20/2014  FINDINGS: Intraoperative angiographic images of the right upper abdominal quadrant during laparoscopic cholecystectomy are provided for review.  Surgical clips overlie the expected location of the gallbladder fossa.  Initial contrast injection demonstrates either opacification of the gallbladder neck or extravasation of contrast about the central aspect of the gallbladder fossa.  Subsequent contrast injection demonstrates apparent opacification of the central aspect of the right posterior biliary duct.  There is passage of contrast through the apparent right posterior biliary duct with filling of a non dilated common bile duct. There is passage of contrast though the CBD and into the descending portion of the duodenum.  There are multiple apparent filling defects within the moderately dilated common bile duct which while possibly representative of air bubbles are worrisome for choledocholithiasis.  There is minimal reflux of injected contrast into the common hepatic duct and central aspect of the non dilated intrahepatic biliary system.  IMPRESSION: 1. 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 2. Multiple nonocclusive filling defects are seen within the moderately dilated common bile duct - while possibly represent of air bubbles, are worrisome for choledocholithiasis. Clinical correlation is advised. Further evaluation could be performed with ERCP as clinically indicated. These results will be called to the ordering clinician  or representative by the Radiologist Assistant, and communication documented in the PACS Dashboard.   Electronically Signed   By: Sandi Mariscal M.D.   On: 03/25/2014 11:10   Medications: I have reviewed the patient's  current medications.  Assessment/Plan: 10 Cholelithiasis with choledocholithiasis: s/p laparoscopic cholecystectomy with IOC: filling defects noted_Lipase elevated today but LFT's are stable. Will continue to monitor for now. Continue present care.   LOS: 3 days   Juanita Craver 03/26/2014, 4:16 PM

## 2014-03-26 NOTE — Progress Notes (Addendum)
[facility] PROGRESS NOTE  Lauren [name] JJH:[id number] DOB: 1946/03/23 DOA: 03/23/2014 PCP: No primary provider on file.  HPI/Subjective: Patient seen with hospital at bedside. Still complaining about pain. Pain is worse when she gets up. Placed on PCA last night and she was very lethargic.  Assessment/Plan:  Gallstone Pancreatitis / Cholelithiasis / ?Choledocholithiasis - US Abdomen: Gallstones, dilated common bile duct 9.41mm.   - No infection suspected.  Afebrile.  No WBC elevation.  No gallbladder wall thickening or pericholecystic fluid - Elevated AST/ALT/AP improved since admission.  T.Billi now WNL.  Lipase 81 (03/25/14) (>3000) on admission.   - Surgery and GI consulted.  Cholecystectomy advised and performed 03/25/14 - IOC performed during surgery (see below) filling defects suspicious for CBD stones versus air bubbles. -Await gastroenterology recommendations.  Umbilical Hernia with Chronic Incarceration - Asymptomatic.  Repaired during cholecystectomy  Acute Anxiety -Patient is anxious about blood draws, having pain,"something is not right ". -Patient had aggressive IV fluid resuscitation, check chest x-ray as she is on 3.5 L of oxygen. -Ativan 0.5 mg 4 times a day when necessary.  Disposition: Stable  DVT Prophylaxis: Heparin  Code Status: Full Family Communication: None Disposition Plan: Home   Consultants:  Surgery, Gastroenterology  Procedures:  None  Antibiotics:  None   Objective: Filed Vitals:   03/26/14 0638  BP: 134/82  Pulse: 72  Temp: 98 F (36.7 C)  Resp: 20    Intake/Output Summary (Last 24 hours) at 03/26/14 1047 Last data filed at 03/26/14 8185  Gross per 24 hour  Intake 4866.67 ml  Output    285 ml  Net 4581.67 ml   Filed Weights   03/23/14 2104  Weight: 131.044 kg (288 lb 14.4 oz)    Exam:   Physical Exam General: In and out of sleep during interview - recently given pain meds.  Uncomfortable with movement.   NAD HEENT: Normocephalic/Atraumatic, conj. clear, anicteric sclera Neck: Excess adipose tissue. Supple, full ROM Lungs: Breathing shallow but unlabored, CTA b/l anterior/posterior CV: RRR, no m/g/r appreciated Abdomen: Protuberant. Mildly distended, infrequent BS, soft, tender to light palpation RUQ/epigastric.  Incisions bandaged.  Extremities: No edema, warm to touch Skin: No rash or lesions Neuro: No focal deficits   Data Reviewed: Basic Metabolic Panel:  Recent Labs Lab 03/23/14 1714 03/24/14 0710 03/25/14 0740 03/26/14 0552  NA 138 137 140 140  K 4.2 3.9 3.7 4.8  CL 98 103 106 105  CO2 25 22 23 22   GLUCOSE 97 77 91 136*  BUN 10 8 4* 4*  CREATININE 0.72 0.64 0.59 0.70  CALCIUM 9.5 8.2* 8.1* 8.2*   Liver Function Tests:  Recent Labs Lab 03/23/14 1714 03/24/14 0710 03/25/14 0740 03/26/14 0552  AST 216* 100* 37 47*  ALT 118* 86* 54* 51*  ALKPHOS 187* 159* 128* 132*  BILITOT 2.3* 0.6 0.4 0.5  PROT 7.9 6.4 6.0 6.3  ALBUMIN 3.4* 2.8* 2.5* 2.6*    Recent Labs Lab 03/23/14 1714 03/24/14 1452 03/25/14 0740 03/26/14 0552  LIPASE >3000* 400* 81* 231*   CBC:  Recent Labs Lab 03/23/14 1714 03/24/14 0710  WBC 10.1 7.9  NEUTROABS 6.9  --   HGB 14.2 12.1  HCT 42.2 37.7  MCV 84.9 86.3  PLT 259 200    Recent Results (from the past 240 hour(s))  SURGICAL PCR SCREEN     Status: None   Collection Time    03/24/14  4:29 AM      Result Value Ref Range Status  MRSA, PCR NEGATIVE  NEGATIVE Final   Staphylococcus aureus NEGATIVE  NEGATIVE Final   Comment:            The Xpert SA Assay (FDA     approved for NASAL specimens     in patients over 30 years of age),     is one component of     a comprehensive surveillance     program.  Test performance has     been validated by [employer] for patients greater     than or equal to 15 year old.     It is not intended     to diagnose infection nor to     guide or monitor treatment.     Studies: Dg  Cholangiogram Operative  03/25/2014   CLINICAL DATA:  Laparoscopic cholecystectomy for gallstones  EXAM: INTRAOPERATIVE CHOLANGIOGRAM  FLUOROSCOPY TIME:  28 seconds  COMPARISON:  Abdominal ultrasound - 03/23/2014; abdominal CT -03/19/2014; nuclear medicine HIDA scan - 03/20/2014  FINDINGS: Intraoperative angiographic images of the right upper abdominal quadrant during laparoscopic cholecystectomy are provided for review.  Surgical clips overlie the expected location of the gallbladder fossa.  Initial contrast injection demonstrates either opacification of the gallbladder neck or extravasation of contrast about the central aspect of the gallbladder fossa.  Subsequent contrast injection demonstrates apparent opacification of the central aspect of the right posterior biliary duct.  There is passage of contrast through the apparent right posterior biliary duct with filling of a non dilated common bile duct. There is passage of contrast though the CBD and into the descending portion of the duodenum.  There are multiple apparent filling defects within the moderately dilated common bile duct which while possibly representative of air bubbles are worrisome for choledocholithiasis.  There is minimal reflux of injected contrast into the common hepatic duct and central aspect of the non dilated intrahepatic biliary system.  IMPRESSION: 1. 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 2. Multiple nonocclusive filling defects are seen within the moderately dilated common bile duct - while possibly represent of air bubbles, are worrisome for choledocholithiasis. Clinical correlation is advised. Further evaluation could be performed with ERCP as clinically indicated. These results will be called to the ordering clinician or representative by the Radiologist Assistant, and communication documented in the PACS Dashboard.   Electronically Signed   By: Sandi Mariscal M.D.   On: 03/25/2014 11:10     Scheduled Meds: . enoxaparin (LOVENOX) injection  40 mg Subcutaneous Q24H  . levothyroxine  100 mcg Oral QAC breakfast  . pantoprazole (PROTONIX) IV  40 mg Intravenous QHS  . sodium chloride  1,000 mL Intravenous Once  . sodium chloride  1,000 mL Intravenous Once  . sodium chloride  1,000 mL Intravenous Once   Continuous Infusions: . sodium chloride 200 mL/hr at 03/26/14 0507    Principal Problem:   Pancreatitis, acute Active Problems:   Acute cholecystitis   Gallstone pancreatitis   Anxiety state, unspecified   Time spent: 47    [facility]  If 7PM-7AM, please contact night-coverage at [email or web address], password [facility] 03/26/2014, 10:47 AM  LOS: 3 days   Birdie Hopes, MD [facility] Pager: [phone] 03/26/2014, 10:47 AM

## 2014-03-27 LAB — COMPREHENSIVE METABOLIC PANEL
ALT: 36 U/L — AB (ref 0–35)
AST: 27 U/L (ref 0–37)
Albumin: 2.5 g/dL — ABNORMAL LOW (ref 3.5–5.2)
Alkaline Phosphatase: 123 U/L — ABNORMAL HIGH (ref 39–117)
BUN: 3 mg/dL — ABNORMAL LOW (ref 6–23)
CHLORIDE: 99 meq/L (ref 96–112)
CO2: 24 meq/L (ref 19–32)
Calcium: 8.3 mg/dL — ABNORMAL LOW (ref 8.4–10.5)
Creatinine, Ser: 0.62 mg/dL (ref 0.50–1.10)
GFR calc Af Amer: >90 mL/min (ref >90)
Glucose, Bld: 117 mg/dL — ABNORMAL HIGH (ref 70–99)
Potassium: 3.4 mEq/L — ABNORMAL LOW (ref 3.7–5.3)
Sodium: 136 mEq/L — ABNORMAL LOW (ref 137–147)
Total Bilirubin: 0.7 mg/dL (ref 0.3–1.2)
Total Protein: 6.3 g/dL (ref 6.0–8.3)

## 2014-03-27 LAB — CBC
HCT: 35.5 % — ABNORMAL LOW (ref 36.0–46.0)
Hemoglobin: 11.5 g/dL — ABNORMAL LOW (ref 12.0–15.0)
MCH: 28 pg (ref 26.0–34.0)
MCHC: 32.4 g/dL (ref 30.0–36.0)
MCV: 86.4 fL (ref 78.0–100.0)
Platelets: 179 10*3/uL (ref 150–400)
RBC: 4.11 MIL/uL (ref 3.87–5.11)
RDW: 14.6 % (ref 11.5–15.5)
WBC: 15.2 10*3/uL — ABNORMAL HIGH (ref 4.0–10.5)

## 2014-03-27 LAB — TSH: TSH: 1.5 u[IU]/mL (ref 0.350–4.500)

## 2014-03-27 LAB — LIPASE, BLOOD: Lipase: 25 U/L (ref 11–59)

## 2014-03-27 MED ORDER — PIPERACILLIN-TAZOBACTAM 3.375 G IVPB
3.3750 g | Freq: Three times a day (TID) | INTRAVENOUS | Status: DC
  Administered 2014-03-27 – 2014-03-28 (×4): 3.375 g via INTRAVENOUS
  Filled 2014-03-27 (×6): qty 50

## 2014-03-27 MED ORDER — FUROSEMIDE 10 MG/ML IJ SOLN
40.0000 mg | Freq: Once | INTRAMUSCULAR | Status: AC
  Administered 2014-03-27: 40 mg via INTRAVENOUS
  Filled 2014-03-27: qty 4

## 2014-03-27 MED ORDER — POTASSIUM CHLORIDE CRYS ER 20 MEQ PO TBCR
40.0000 meq | EXTENDED_RELEASE_TABLET | Freq: Four times a day (QID) | ORAL | Status: AC
Start: 2014-03-27 — End: 2014-03-27
  Administered 2014-03-27 (×2): 40 meq via ORAL
  Filled 2014-03-27 (×2): qty 2

## 2014-03-27 NOTE — Progress Notes (Signed)
Unassigned patient Subjective: Since I last evaluated the patient, she seems to be doing slightly better. Does not want to take pain meds orally but wants them in her IV. She continues to complain of abdominal pain even though her Lipase has normalized and her LFT's have improved. She is s/p laparoscopic cholecystectomy. She has a history of gallstone pancreatitis and had an incarcerated umbilical hernia reparied along with the laparoscopic cholecystectomy. She claims she has been dry heaving a lot.  Objective: Vital signs in last 24 hours: Temp:  [97.7 F (36.5 C)-99.4 F (37.4 C)] 99.4 F (37.4 C) (05/03 0513) Pulse Rate:  [73-78] 73 (05/03 0513) Resp:  [18-20] 20 (05/03 0513) BP: (137-167)/(74-81) 144/75 mmHg (05/03 0513) SpO2:  [92 %-95 %] 94 % (05/03 0513) Last BM Date: 03/24/14  Intake/Output from previous day: 05/02 0701 - 05/03 0700 In: 120 [P.O.:120] Out: 2050 [Urine:2050] Intake/Output this shift: Total I/O In: 120 [P.O.:120] Out: 450 [Urine:450]  General appearance: appears stated age, fatigued, moderate distress and morbidly obese Resp: clear to auscultation bilaterally Cardio: regular rate and rhythm, S1, S2 normal, no murmur, click, rub or gallop GI: soft, diffusely tender; bowel sounds are hypoactive: no masses,  no organomegaly  Lab Results:  Recent Labs  03/27/14 0600  WBC 15.2*  HGB 11.5*  HCT 35.5*  PLT 179   BMET  Recent Labs  03/25/14 0740 03/26/14 0552 03/27/14 0600  NA 140 140 136*  K 3.7 4.8 3.4*  CL 106 105 99  CO2 23 22 24   GLUCOSE 91 136* 117*  BUN 4* 4* 3*  CREATININE 0.59 0.70 0.62  CALCIUM 8.1* 8.2* 8.3*   LFT  Recent Labs  03/27/14 0600  PROT 6.3  ALBUMIN 2.5*  AST 27  ALT 36*  ALKPHOS 123*  BILITOT 0.7   Studies/Results: Dg Chest 2 View  03/26/2014   CLINICAL DATA:  Shortness of breath  EXAM: CHEST  2 VIEW  COMPARISON:  03/19/2014  FINDINGS: Moderate cardiac enlargement. There are low lung volumes. Bilateral pleural  effusions and bibasilar atelectasis is identified.  IMPRESSION: Low lung volumes, bilateral pleural effusions and bibasilar atelectasis.   Electronically Signed   By: Kerby Moors M.D.   On: 03/26/2014 15:43   Medications: I have reviewed the patient's current medications.  Assessment/Plan: 1) Choledocholithiasis with positive IOC during surgery: Her LFT's have improved and her Lipase have completely normalized. There seems to be no need to proceed with a ERCP at this time. Continue to monitor her LFT's and her symptoms and call as needed.  2) Morbid obesity.   LOS: 4 days   Juanita Craver 03/27/2014, 3:16 PM

## 2014-03-27 NOTE — Progress Notes (Signed)
ANTIBIOTIC CONSULT NOTE - INITIAL  Pharmacy Consult for Zosyn Indication: empiric therapy  Allergies  Allergen Reactions  . Codeine Itching  . Compazine [Prochlorperazine Edisylate]   . Prednisone Nausea And Vomiting  . Vicodin [Hydrocodone-Acetaminophen] Itching    Patient Measurements: Height: 5\' 2"  (157.5 cm) Weight: 288 lb 14.4 oz (131.044 kg) IBW/kg (Calculated) : 50.1  Vital Signs: Temp: 99.4 F (37.4 C) (05/03 0513) Temp src: Oral (05/03 0513) BP: 144/75 mmHg (05/03 0513) Pulse Rate: 73 (05/03 0513) Intake/Output from previous day: 05/02 0701 - 05/03 0700 In: 120 [P.O.:120] Out: 2050 [Urine:2050] Intake/Output from this shift:    Labs:  Recent Labs  03/25/14 0740 03/26/14 0552 03/27/14 0600  WBC  --   --  15.2*  HGB  --   --  11.5*  PLT  --   --  179  CREATININE 0.59 0.70 0.62   Estimated Creatinine Clearance: 91.3 ml/min (by C-G formula based on Cr of 0.62). No results found for this basename: VANCOTROUGH, Corlis Leak, VANCORANDOM, GENTTROUGH, GENTPEAK, GENTRANDOM, TOBRATROUGH, TOBRAPEAK, TOBRARND, AMIKACINPEAK, AMIKACINTROU, AMIKACIN,  in the last 72 hours   Microbiology: Recent Results (from the past 720 hour(s))  SURGICAL PCR SCREEN     Status: None   Collection Time    03/24/14  4:29 AM      Result Value Ref Range Status   MRSA, PCR NEGATIVE  NEGATIVE Final   Staphylococcus aureus NEGATIVE  NEGATIVE Final   Comment:            The Xpert SA Assay (FDA     approved for NASAL specimens     in patients over 61 years of age),     is one component of     a comprehensive surveillance     program.  Test performance has     been validated by [employer] for patients greater     than or equal to 29 year old.     It is not intended     to diagnose infection nor to     guide or monitor treatment.    Medical History: Past Medical History  Diagnosis Date  . Obesity   . Diverticulosis   . Periumbilical hernia   . Hypothyroidism   . Acute  cholecystitis 12/06/2012  . Atrial fibrillation with RVR 12/06/2012  . Pancreatitis, acute 12/04/2012    Assessment: 68 y/o female s/p lap chole and incarcerated umbilical hernia repair on 5/1. WBC have risen so pharmacy consulted to begin empiric Zosyn therapy. She is afebrile and renal function is stable. Cultures are pending. Surgery notes no clear sign of infection.  Goal of Therapy:  Eradication of infection  Plan:  - Zosyn 3.375 g IV q8h to be infused over 4 hours - Pharmacy signing off, please re-consult if needed  [facility], [place].D., BCPS Clinical Pharmacist Pager: [phone] 03/27/2014 8:55 AM

## 2014-03-27 NOTE — Progress Notes (Signed)
2 Days Post-Op lap chole Subjective: Abnormal IOC with concern for choledocholithiasis.  No obstruction noted.  Pt feels somewhat better.  Has less nausea, no vomiting.  Pain controlled when lying in bed but worsens when she gets out of bed.    Objective: Vital signs in last 24 hours: Temp:  [97.7 F (36.5 C)-99.4 F (37.4 C)] 99.4 F (37.4 C) (05/03 0513) Pulse Rate:  [73-78] 73 (05/03 0513) Resp:  [18-20] 20 (05/03 0513) BP: (137-167)/(74-81) 144/75 mmHg (05/03 0513) SpO2:  [92 %-95 %] 94 % (05/03 0513)   Intake/Output from previous day: 05/02 0701 - 05/03 0700 In: 120 [P.O.:120] Out: 2050 [Urine:2050] Intake/Output this shift:   General appearance: alert and cooperative GI: normal findings: soft, tender in LLQ Incision: no significant drainage  Lab Results:   Recent Labs  03/27/14 0600  WBC 15.2*  HGB 11.5*  HCT 35.5*  PLT 179   BMET  Recent Labs  03/26/14 0552 03/27/14 0600  NA 140 136*  K 4.8 3.4*  CL 105 99  CO2 22 24  GLUCOSE 136* 117*  BUN 4* 3*  CREATININE 0.70 0.62  CALCIUM 8.2* 8.3*   PT/INR No results found for this basename: LABPROT, INR,  in the last 72 hours ABG No results found for this basename: PHART, PCO2, PO2, HCO3,  in the last 72 hours  MEDS, Scheduled . enoxaparin (LOVENOX) injection  40 mg Subcutaneous Q24H  . levothyroxine  100 mcg Oral QAC breakfast  . pantoprazole (PROTONIX) IV  40 mg Intravenous QHS    Studies/Results: Dg Chest 2 View  03/26/2014   CLINICAL DATA:  Shortness of breath  EXAM: CHEST  2 VIEW  COMPARISON:  03/19/2014  FINDINGS: Moderate cardiac enlargement. There are low lung volumes. Bilateral pleural effusions and bibasilar atelectasis is identified.  IMPRESSION: Low lung volumes, bilateral pleural effusions and bibasilar atelectasis.   Electronically Signed   By: Kerby Moors M.D.   On: 03/26/2014 15:43   Dg Cholangiogram Operative  03/25/2014   CLINICAL DATA:  Laparoscopic cholecystectomy for gallstones   EXAM: INTRAOPERATIVE CHOLANGIOGRAM  FLUOROSCOPY TIME:  28 seconds  COMPARISON:  Abdominal ultrasound - 03/23/2014; abdominal CT -03/19/2014; nuclear medicine HIDA scan - 03/20/2014  FINDINGS: Intraoperative angiographic images of the right upper abdominal quadrant during laparoscopic cholecystectomy are provided for review.  Surgical clips overlie the expected location of the gallbladder fossa.  Initial contrast injection demonstrates either opacification of the gallbladder neck or extravasation of contrast about the central aspect of the gallbladder fossa.  Subsequent contrast injection demonstrates apparent opacification of the central aspect of the right posterior biliary duct.  There is passage of contrast through the apparent right posterior biliary duct with filling of a non dilated common bile duct. There is passage of contrast though the CBD and into the descending portion of the duodenum.  There are multiple apparent filling defects within the moderately dilated common bile duct which while possibly representative of air bubbles are worrisome for choledocholithiasis.  There is minimal reflux of injected contrast into the common hepatic duct and central aspect of the non dilated intrahepatic biliary system.  IMPRESSION: 1. 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 2. Multiple nonocclusive filling defects are seen within the moderately dilated common bile duct - while possibly represent of air bubbles, are worrisome for choledocholithiasis. Clinical correlation is advised. Further evaluation could be performed with ERCP as clinically indicated. These results will be called to the ordering clinician or representative  by the Radiologist Assistant, and communication documented in the PACS Dashboard.   Electronically Signed   By: Sandi Mariscal M.D.   On: 03/25/2014 11:10    Assessment: s/p Procedure(s): LAPAROSCOPIC CHOLECYSTECTOMY WITH INTRAOPERATIVE  CHOLANGIOGRAM Patient Active Problem List   Diagnosis Date Noted  . Anxiety state, unspecified 03/24/2014  . Gallstone pancreatitis 03/23/2014  . Acute cholecystitis 12/06/2012  . Atrial fibrillation with RVR 12/06/2012  . Pancreatitis, acute 12/04/2012  . Hypothyroidism 12/04/2012  . Obesity (BMI 35.0-39.9 without comorbidity) 12/04/2012     Plan: Advance to full liquids Lipase back to normal: most likely from [facility].  LFT's trending down.  Ambulate  Elevated WBC, but no clear sign of infection.  May be post surgical.  Medical team to start empiric abx and recheck in AM ERCP per GI recs, f/u with Dr Benson Norway   LOS: 4 days     .Rosario Adie, [facility], [place]   03/27/2014 8:51 AM

## 2014-03-27 NOTE — Progress Notes (Signed)
[facility] PROGRESS NOTE  ALIZZON DIOGUARDI YQM:[id number] DOB: 29-Feb-1946 DOA: 03/23/2014 PCP: No primary provider on file.  HPI/Subjective: Still complaining about severe pain but more controlled, she is on oral Percocet. Developed low-grade fever of 99.4 and leukocytosis.  Assessment/Plan:  Gallstone Pancreatitis / Cholelithiasis / ?Choledocholithiasis - US Abdomen: Gallstones, dilated common bile duct 9.58mm.   - No infection suspected.  Afebrile.  No WBC elevation.  No gallbladder wall thickening or pericholecystic fluid - Elevated AST/ALT/AP improved since admission.  T.Billi now WNL.  Lipase 81 (03/25/14) (>3000) on admission.   - Surgery and GI consulted.  Cholecystectomy advised and performed 03/25/14 - IOC performed during surgery (see below) filling defects suspicious for CBD stones versus air bubbles. - GI recommended observation for now as patient's LFTs and lipase are improving without ERCP.  Fever -Low-grade fever of 99.4 leukocytosis of 15.2. -Urinalysis is clear and chest x-ray without infiltrates. -Started empirically on Zosyn, check WBC count in a.m.  Umbilical Hernia with Chronic Incarceration - Asymptomatic.  Repaired during cholecystectomy  Acute Anxiety -Patient is anxious about blood draws, having pain,"something is not right ". -Patient had aggressive IV fluid resuscitation, check chest x-ray as she is on 3.5 L of oxygen. -Ativan 0.5 mg 4 times a day when necessary.  Fluid overload -Patient had aggressive IV fluid hydration for her acute pancreatitis. -Chest x-ray from yesterday showed bilateral pleural effusion. -IV fluids discontinued yesterday, Lasix as needed.  Disposition: Stable  DVT Prophylaxis: Heparin  Code Status: Full Family Communication: None Disposition Plan: Home   Consultants:  Surgery, Gastroenterology  Procedures:  None  Antibiotics:  None   Objective: Filed Vitals:   03/27/14 0513  BP: 144/75  Pulse: 73  Temp:  99.4 F (37.4 C)  Resp: 20    Intake/Output Summary (Last 24 hours) at 03/27/14 1005 Last data filed at 03/27/14 5284  Gross per 24 hour  Intake    240 ml  Output   1150 ml  Net   -910 ml   Filed Weights   03/23/14 2104  Weight: 131.044 kg (288 lb 14.4 oz)    Exam:   Physical Exam General: In and out of sleep during interview - recently given pain meds.  Uncomfortable with movement.  NAD HEENT: Normocephalic/Atraumatic, conj. clear, anicteric sclera Neck: Excess adipose tissue. Supple, full ROM Lungs: Breathing shallow but unlabored, CTA b/l anterior/posterior CV: RRR, no m/g/r appreciated Abdomen: Protuberant. Mildly distended, infrequent BS, soft, tender to light palpation RUQ/epigastric.  Incisions bandaged.  Extremities: No edema, warm to touch Skin: No rash or lesions Neuro: No focal deficits   Data Reviewed: Basic Metabolic Panel:  Recent Labs Lab 03/23/14 1714 03/24/14 0710 03/25/14 0740 03/26/14 0552 03/27/14 0600  NA 138 137 140 140 136*  K 4.2 3.9 3.7 4.8 3.4*  CL 98 103 106 105 99  CO2 25 22 23 22 24   GLUCOSE 97 77 91 136* 117*  BUN 10 8 4* 4* 3*  CREATININE 0.72 0.64 0.59 0.70 0.62  CALCIUM 9.5 8.2* 8.1* 8.2* 8.3*   Liver Function Tests:  Recent Labs Lab 03/23/14 1714 03/24/14 0710 03/25/14 0740 03/26/14 0552 03/27/14 0600  AST 216* 100* 37 47* 27  ALT 118* 86* 54* 51* 36*  ALKPHOS 187* 159* 128* 132* 123*  BILITOT 2.3* 0.6 0.4 0.5 0.7  PROT 7.9 6.4 6.0 6.3 6.3  ALBUMIN 3.4* 2.8* 2.5* 2.6* 2.5*    Recent Labs Lab 03/23/14 1714 03/24/14 1452 03/25/14 0740 03/26/14 0552 03/27/14 0600  LIPASE >  3000* 400* 81* 231* 25   CBC:  Recent Labs Lab 03/23/14 1714 03/24/14 0710 03/27/14 0600  WBC 10.1 7.9 15.2*  NEUTROABS 6.9  --   --   HGB 14.2 12.1 11.5*  HCT 42.2 37.7 35.5*  MCV 84.9 86.3 86.4  PLT 259 200 179    Recent Results (from the past 240 hour(s))  SURGICAL PCR SCREEN     Status: None   Collection Time    03/24/14   4:29 AM      Result Value Ref Range Status   MRSA, PCR NEGATIVE  NEGATIVE Final   Staphylococcus aureus NEGATIVE  NEGATIVE Final   Comment:            The Xpert SA Assay (FDA     approved for NASAL specimens     in patients over 99 years of age),     is one component of     a comprehensive surveillance     program.  Test performance has     been validated by [employer] for patients greater     than or equal to 66 year old.     It is not intended     to diagnose infection nor to     guide or monitor treatment.     Studies: Dg Chest 2 View  03/26/2014   CLINICAL DATA:  Shortness of breath  EXAM: CHEST  2 VIEW  COMPARISON:  03/19/2014  FINDINGS: Moderate cardiac enlargement. There are low lung volumes. Bilateral pleural effusions and bibasilar atelectasis is identified.  IMPRESSION: Low lung volumes, bilateral pleural effusions and bibasilar atelectasis.   Electronically Signed   By: Kerby Moors M.D.   On: 03/26/2014 15:43    Scheduled Meds: . enoxaparin (LOVENOX) injection  40 mg Subcutaneous Q24H  . furosemide  40 mg Intravenous Once  . levothyroxine  100 mcg Oral QAC breakfast  . pantoprazole (PROTONIX) IV  40 mg Intravenous QHS  . piperacillin-tazobactam (ZOSYN)  IV  3.375 g Intravenous Q8H  . potassium chloride  40 mEq Oral Q6H   Continuous Infusions:    Principal Problem:   Pancreatitis, acute Active Problems:   Acute cholecystitis   Gallstone pancreatitis   Anxiety state, unspecified   Time spent: 84    [facility]  If 7PM-7AM, please contact night-coverage at [email or web address], password [facility] 03/27/2014, 10:05 AM  LOS: 4 days   Birdie Hopes, MD [facility] Pager: [phone] 03/27/2014, 10:05 AM

## 2014-03-28 LAB — COMPREHENSIVE METABOLIC PANEL
ALT: 36 U/L — ABNORMAL HIGH (ref 0–35)
AST: 29 U/L (ref 0–37)
Albumin: 2.3 g/dL — ABNORMAL LOW (ref 3.5–5.2)
Alkaline Phosphatase: 161 U/L — ABNORMAL HIGH (ref 39–117)
BUN: 5 mg/dL — ABNORMAL LOW (ref 6–23)
CALCIUM: 8.4 mg/dL (ref 8.4–10.5)
CO2: 27 mEq/L (ref 19–32)
CREATININE: 0.6 mg/dL (ref 0.50–1.10)
Chloride: 97 mEq/L (ref 96–112)
GFR calc non Af Amer: >90 mL/min (ref >90)
GLUCOSE: 107 mg/dL — AB (ref 70–99)
Potassium: 3.4 mEq/L — ABNORMAL LOW (ref 3.7–5.3)
SODIUM: 135 meq/L — AB (ref 137–147)
TOTAL PROTEIN: 6 g/dL (ref 6.0–8.3)
Total Bilirubin: 0.6 mg/dL (ref 0.3–1.2)

## 2014-03-28 LAB — CBC
HEMATOCRIT: 33.6 % — AB (ref 36.0–46.0)
HEMOGLOBIN: 10.7 g/dL — AB (ref 12.0–15.0)
MCH: 27.6 pg (ref 26.0–34.0)
MCHC: 31.8 g/dL (ref 30.0–36.0)
MCV: 86.6 fL (ref 78.0–100.0)
Platelets: 188 10*3/uL (ref 150–400)
RBC: 3.88 MIL/uL (ref 3.87–5.11)
RDW: 14.7 % (ref 11.5–15.5)
WBC: 13.3 10*3/uL — AB (ref 4.0–10.5)

## 2014-03-28 MED ORDER — POTASSIUM CHLORIDE CRYS ER 20 MEQ PO TBCR
40.0000 meq | EXTENDED_RELEASE_TABLET | Freq: Four times a day (QID) | ORAL | Status: AC
  Administered 2014-03-28 (×2): 40 meq via ORAL
  Filled 2014-03-28 (×2): qty 2

## 2014-03-28 MED ORDER — ONDANSETRON HCL 4 MG PO TABS: 4.0000 mg | ORAL_TABLET | Freq: Three times a day (TID) | ORAL | Status: DC | PRN

## 2014-03-28 MED ORDER — NYSTATIN 100000 UNIT/ML MT SUSP: 5.0000 mL | Freq: Four times a day (QID) | OROMUCOSAL | Status: DC

## 2014-03-28 MED ORDER — OXYCODONE-ACETAMINOPHEN 5-325 MG PO TABS: 1.0000 | ORAL_TABLET | Freq: Four times a day (QID) | ORAL | Status: DC | PRN

## 2014-03-28 MED ORDER — AMOXICILLIN-POT CLAVULANATE 875-125 MG PO TABS: 1.0000 | ORAL_TABLET | Freq: Two times a day (BID) | ORAL | Status: AC

## 2014-03-28 NOTE — Progress Notes (Signed)
Patient ID: Lauren [name], female   DOB: 1946-07-06, 68 y.o.   MRN: [id number]  Subjective: Tolerating a diet, 2 BMs today. Intermittent nausea.  OOB.  C/o pain.  Afebrile, WBC trending down.  C/o chills and sweats.  LFTs are down.    Objective:  Vital signs:  Filed Vitals:   03/27/14 1543 03/27/14 1741 03/27/14 2114 03/28/14 0601  BP: 147/79  167/84 144/77  Pulse: 70 74 79 78  Temp: 98.1 F (36.7 C)  98.1 F (36.7 C) 98.4 F (36.9 C)  TempSrc: Oral  Oral Oral  Resp: 20  18 20   Height:      Weight:      SpO2: 97% 93% 91% 92%    Last BM Date: 03/28/14  Intake/Output   Yesterday:  05/03 0701 - 05/04 0700 In: 520 [P.O.:420; IV Piggyback:100] Out: 1750 [Urine:1750] This shift:    I/O last 3 completed shifts: In: 640 [P.O.:540; IV Piggyback:100] Out: 2500 [Urine:2500]    Physical Exam: General: Pt awake/alert/oriented x4 in no acute distress Abdomen: Soft.  Nondistended. Moderate ttp to periumbilical region.  Incisions are c/d/i.   No evidence of peritonitis.  No incarcerated hernias.   Problem List:   Principal Problem:   Pancreatitis, acute Active Problems:   Acute cholecystitis   Gallstone pancreatitis   Anxiety state, unspecified    Results:   Labs: Results for orders placed during the hospital encounter of 03/23/14 (from the past 48 hour(s))  URINALYSIS, ROUTINE W REFLEX MICROSCOPIC     Status: Abnormal   Collection Time    03/26/14  1:46 PM      Result Value Ref Range   Color, Urine YELLOW  YELLOW   APPearance CLEAR  CLEAR   Specific Gravity, Urine 1.011  1.005 - 1.030   pH 5.0  5.0 - 8.0   Glucose, UA NEGATIVE  NEGATIVE mg/dL   Hgb urine dipstick MODERATE (*) NEGATIVE   Bilirubin Urine NEGATIVE  NEGATIVE   Ketones, ur NEGATIVE  NEGATIVE mg/dL   Protein, ur NEGATIVE  NEGATIVE mg/dL   Urobilinogen, UA 0.2  0.0 - 1.0 mg/dL   Nitrite NEGATIVE  NEGATIVE   Leukocytes, UA NEGATIVE  NEGATIVE  URINE MICROSCOPIC-ADD ON     Status: Abnormal   Collection Time    03/26/14  1:46 PM      Result Value Ref Range   Squamous Epithelial / LPF FEW (*) RARE   RBC / HPF 0-2  <3 RBC/hpf  LIPASE, BLOOD     Status: None   Collection Time    03/27/14  6:00 AM      Result Value Ref Range   Lipase 25  11 - 59 U/L  COMPREHENSIVE METABOLIC PANEL     Status: Abnormal   Collection Time    03/27/14  6:00 AM      Result Value Ref Range   Sodium 136 (*) 137 - 147 mEq/L   Potassium 3.4 (*) 3.7 - 5.3 mEq/L   Chloride 99  96 - 112 mEq/L   CO2 24  19 - 32 mEq/L   Glucose, Bld 117 (*) 70 - 99 mg/dL   BUN 3 (*) 6 - 23 mg/dL   Creatinine, Ser 0.62  0.50 - 1.10 mg/dL   Calcium 8.3 (*) 8.4 - 10.5 mg/dL   Total Protein 6.3  6.0 - 8.3 g/dL   Albumin 2.5 (*) 3.5 - 5.2 g/dL   AST 27  0 - 37 U/L   ALT 36 (*) 0 -  35 U/L   Alkaline Phosphatase 123 (*) 39 - 117 U/L   Total Bilirubin 0.7  0.3 - 1.2 mg/dL   GFR calc non Af Amer >90  >90 mL/min   GFR calc Af Amer >90  >90 mL/min   Comment: (NOTE)     The eGFR has been calculated using the CKD EPI equation.     This calculation has not been validated in all clinical situations.     eGFR's persistently <90 mL/min signify possible Chronic Kidney     Disease.  CBC     Status: Abnormal   Collection Time    03/27/14  6:00 AM      Result Value Ref Range   WBC 15.2 (*) 4.0 - 10.5 K/uL   RBC 4.11  3.87 - 5.11 MIL/uL   Hemoglobin 11.5 (*) 12.0 - 15.0 g/dL   HCT 35.5 (*) 36.0 - 46.0 %   MCV 86.4  78.0 - 100.0 fL   MCH 28.0  26.0 - 34.0 pg   MCHC 32.4  30.0 - 36.0 g/dL   RDW 14.6  11.5 - 15.5 %   Platelets 179  150 - 400 K/uL  CULTURE, BLOOD (ROUTINE X 2)     Status: None   Collection Time    03/27/14 10:10 AM      Result Value Ref Range   Specimen Description BLOOD LEFT ARM     Special Requests BOTTLES DRAWN AEROBIC ONLY 5CC     Culture  Setup Time       Value: 03/27/2014 18:52     Performed at [facility]   Culture       Value:        BLOOD CULTURE RECEIVED NO GROWTH TO DATE CULTURE WILL BE  HELD FOR 5 DAYS BEFORE ISSUING A FINAL NEGATIVE REPORT     Performed at [facility]   Report Status PENDING    CULTURE, BLOOD (ROUTINE X 2)     Status: None   Collection Time    03/27/14 10:15 AM      Result Value Ref Range   Specimen Description BLOOD RIGHT ARM     Special Requests BOTTLES DRAWN AEROBIC ONLY [place]     Culture  Setup Time       Value: 03/27/2014 18:52     Performed at [facility]   Culture       Value:        BLOOD CULTURE RECEIVED NO GROWTH TO DATE CULTURE WILL BE HELD FOR 5 DAYS BEFORE ISSUING A FINAL NEGATIVE REPORT     Performed at [facility]   Report Status PENDING    TSH     Status: None   Collection Time    03/27/14 10:15 AM      Result Value Ref Range   TSH 1.500  0.350 - 4.500 uIU/mL   Comment: Please note change in reference range.  COMPREHENSIVE METABOLIC PANEL     Status: Abnormal   Collection Time    03/28/14  4:35 AM      Result Value Ref Range   Sodium 135 (*) 137 - 147 mEq/L   Potassium 3.4 (*) 3.7 - 5.3 mEq/L   Chloride 97  96 - 112 mEq/L   CO2 27  19 - 32 mEq/L   Glucose, Bld 107 (*) 70 - 99 mg/dL   BUN 5 (*) 6 - 23 mg/dL   Creatinine, Ser 0.60  0.50 - 1.10 mg/dL  Calcium 8.4  8.4 - 10.5 mg/dL   Total Protein 6.0  6.0 - 8.3 g/dL   Albumin 2.3 (*) 3.5 - 5.2 g/dL   AST 29  0 - 37 U/L   ALT 36 (*) 0 - 35 U/L   Alkaline Phosphatase 161 (*) 39 - 117 U/L   Total Bilirubin 0.6  0.3 - 1.2 mg/dL   GFR calc non Af Amer >90  >90 mL/min   GFR calc Af Amer >90  >90 mL/min   Comment: (NOTE)     The eGFR has been calculated using the CKD EPI equation.     This calculation has not been validated in all clinical situations.     eGFR's persistently <90 mL/min signify possible Chronic Kidney     Disease.  CBC     Status: Abnormal   Collection Time    03/28/14  4:35 AM      Result Value Ref Range   WBC 13.3 (*) 4.0 - 10.5 K/uL   RBC 3.88  3.87 - 5.11 MIL/uL   Hemoglobin 10.7 (*) 12.0 - 15.0 g/dL   HCT 33.6 (*) 36.0 - 46.0  %   MCV 86.6  78.0 - 100.0 fL   MCH 27.6  26.0 - 34.0 pg   MCHC 31.8  30.0 - 36.0 g/dL   RDW 14.7  11.5 - 15.5 %   Platelets 188  150 - 400 K/uL    Imaging / Studies: Dg Chest 2 View  03/26/2014   CLINICAL DATA:  Shortness of breath  EXAM: CHEST  2 VIEW  COMPARISON:  03/19/2014  FINDINGS: Moderate cardiac enlargement. There are low lung volumes. Bilateral pleural effusions and bibasilar atelectasis is identified.  IMPRESSION: Low lung volumes, bilateral pleural effusions and bibasilar atelectasis.   Electronically Signed   By: Kerby Moors M.D.   On: 03/26/2014 15:43    Medications / Allergies: per chart  Antibiotics: Anti-infectives   Start     Dose/Rate Route Frequency Ordered Stop   03/27/14 1000  piperacillin-tazobactam (ZOSYN) IVPB 3.375 g     3.375 g 12.5 mL/hr over 240 Minutes Intravenous Every 8 hours 03/27/14 0902     03/25/14 0600  cefOXitin (MEFOXIN) 2 g in dextrose 5 % 50 mL IVPB     2 g 100 mL/hr over 30 Minutes Intravenous On call to O.R. 03/24/14 1317 03/25/14 0548      Assessment/Plan Gallstone pancreatitis S/p lap chole and primary repair of incarcerated umbilical hernia---Dr. Rosendo Gros 03/25/14 -tolerating a diet, pain controlled, having BMs, ambulating, voiding. -continue to mobilize, IS use -surgically stable, further management per primary team  Erby Pian, [facility] Pager [phone] Office [phone]   03/28/2014 9:55 AM  Agree with above. Choledocholithiasis - seen by Dr. Benson Norway.  Will need ERCP with timing per Dr. Benson Norway.  Okay to go home from out standpoint. Husband and daughter, Pryor Montes, in room.  Alphonsa Overall, MD, [facility] Pager: [phone] Office phone:  [phone]

## 2014-03-28 NOTE — Discharge Instructions (Signed)
Cholecystostomy  The gallbladder is a pear-shaped organ that lies beneath the liver on the right side of the body. The gallbladder stores bile, a fluid that helps the body digest fats. However, sometimes bile and other fluids build up in the gallbladder because of an obstruction (for example, a gallstones). This can cause fever, pain, swelling, nausea and other serious symptoms. The procedure used to drain these fluids is called a cholecystostomy. A tube is inserted into the gallbladder. Fluid drains through the tube into a plastic bag outside the body. This procedure is usually done on people who are admitted to the hospital. The procedure is often recommended for people who cannot have gallbladder surgery right away, usually because they are too ill to make it through surgery. The cholecystostomy tube is usually temporary, until surgery can be done. RISKS AND COMPLICATIONS Although rare, complications can include:  Clogging of the tube.  Infection in or around the drain site. Antibiotics might be prescribed for the infection. Or, another tube might be inserted to drain the infected fluid.  Internal bleeding from the liver. BEFORE THE PROCEDURE   Try to quit smoking several weeks before the procedure. Smoking can slow healing.  Arrange for someone to drive you home from the hospital.  Right before your procedure, avoid all foods and liquids after midnight. This includes coffee, tea and water.  On the day of the procedure, arrive early to fill out all the paperwork. PROCEDURE You will be given a sedative to make you sleepy and a local anesthetic to numb the skin. Next, a small cut is made in the abdomen. Then a tube is threaded through the cut into the gallbladder. The procedure is usually done with ultrasound to guide the tube into the gallbladder. Once the tube is in place, the drain is secured to the skin with a stitch. The tube is then connected to a drainage bag.  AFTER THE PROCEDURE    People who have a cholecystostomy usually stay in the hospital for several days because they are so ill. You might not be able to eat for the first few days. Instead, you will be connected to an IV for fluids and nutrients.  The procedure does not cure the blockage that caused the fluid to build up in the first place. Because of this, the gallbladder will need to be removed in the future. The drain is removed at that time. HOME CARE INSTRUCTIONS  Be sure to follow your healthcare provider's instructions carefully. You may shower but avoid tub baths and swimming until your caregiver says it is OK. Eat and drink according to the directions you have been given. And be sure to make all follow-up appointments.  Call your healthcare provider if you notice new pain, redness or swelling around the wound. SEEK IMMEDIATE MEDICAL CARE IF:   There is increased abdominal pain.  Nausea or vomiting occurs.  You develop a fever.  The drainage tube comes out of the abdomen. Document Released: 02/07/2009 Document Revised: 02/03/2012 Document Reviewed: 02/07/2009 [facility] Patient Information 2014 [place], [place].  LAPAROSCOPIC SURGERY: POST OP INSTRUCTIONS  1. DIET: Follow a light bland diet the first 24 hours after arrival home, such as soup, liquids, crackers, etc.  Be sure to include lots of fluids daily.  Avoid fast food or heavy meals as your are more likely to get nauseated.  Eat a low fat the next few days after surgery.   2. Take your usually prescribed home medications unless otherwise directed. 3. PAIN  CONTROL: a. Pain is best controlled by a usual combination of three different methods TOGETHER: i. Ice/Heat ii. Over the counter pain medication iii. Prescription pain medication b. Most patients will experience some swelling and bruising around the incisions.  Ice packs or heating pads (30-60 minutes up to 6 times a day) will help. Use ice for the first few days to help decrease swelling and  bruising, then switch to heat to help relax tight/sore spots and speed recovery.  Some people prefer to use ice alone, heat alone, alternating between ice & heat.  Experiment to what works for you.  Swelling and bruising can take several weeks to resolve.   c. It is helpful to take an over-the-counter pain medication regularly for the first few weeks.  Choose one of the following that works best for you: i. Naproxen (Aleve, etc)  Two 220mg  tabs twice a day ii. Ibuprofen (Advil, etc) Three 200mg  tabs four times a day (every meal & bedtime) iii. Acetaminophen (Tylenol, etc) 500-650mg  four times a day (every meal & bedtime) d. A  prescription for pain medication (such as oxycodone, hydrocodone, etc) should be given to you upon discharge.  Take your pain medication as prescribed.  i. If you are having problems/concerns with the prescription medicine (does not control pain, nausea, vomiting, rash, itching, etc), please call us [phone] to see if we need to switch you to a different pain medicine that will work better for you and/or control your side effect better. ii. If you need a refill on your pain medication, please contact your pharmacy.  They will contact our office to request authorization. Prescriptions will not be filled after 5 pm or on week-ends. 4. Avoid getting constipated.  Between the surgery and the pain medications, it is common to experience some constipation.  Increasing fluid intake and taking a fiber supplement (such as Metamucil, Citrucel, FiberCon, MiraLax, etc) 1-2 times a day regularly will usually help prevent this problem from occurring.  A mild laxative (prune juice, Milk of Magnesia, MiraLax, etc) should be taken according to package directions if there are no bowel movements after 48 hours.   5. Watch out for diarrhea.  If you have many loose bowel movements, simplify your diet to bland foods & liquids for a few days.  Stop any stool softeners and decrease your fiber  supplement.  Switching to mild anti-diarrheal medications (Kayopectate, Pepto Bismol) can help.  If this worsens or does not improve, please call us. 6. Wash / shower every day.  You may shower over the dressings as they are waterproof.  Continue to shower over incision(s) after the dressing is off. 7. Remove your waterproof bandages 5 days after surgery.  You may leave the incision open to air.  You may replace a dressing/Band-Aid to cover the incision for comfort if you wish.  8. ACTIVITIES as tolerated:   a. You may resume regular (light) daily activities beginning the next day--such as daily self-care, walking, climbing stairs--gradually increasing activities as tolerated.  If you can walk 30 minutes without difficulty, it is safe to try more intense activity such as jogging, treadmill, bicycling, low-impact aerobics, swimming, etc. b. Save the most intensive and strenuous activity for last such as sit-ups, heavy lifting, contact sports, etc  Refrain from any heavy lifting or straining until you are off narcotics for pain control.   c. DO NOT PUSH THROUGH PAIN.  Let pain be your guide: If it hurts to do something, don't do it.  Pain is your body warning you to avoid that activity for another week until the pain goes down. d. You may drive when you are no longer taking prescription pain medication, you can comfortably wear a seatbelt, and you can safely maneuver your car and apply brakes. e. Dennis Bast may have sexual intercourse when it is comfortable.  9. FOLLOW UP in our office a. Please call [facility] at [phone] to set up an appointment to see your surgeon in the office for a follow-up appointment approximately 2-3 weeks after your surgery. b. Make sure that you call for this appointment the day you arrive home to insure a convenient appointment time. 10. IF YOU HAVE DISABILITY OR FAMILY LEAVE FORMS, BRING THEM TO THE OFFICE FOR PROCESSING.  DO NOT GIVE THEM TO YOUR DOCTOR.   WHEN TO CALL us [phone]  [phone]: 1. Poor pain control 2. Reactions / problems with new medications (rash/itching, nausea, etc)  3. Fever over 101.5 F (38.5 C) 4. Inability to urinate 5. Nausea and/or vomiting 6. Worsening swelling or bruising 7. Continued bleeding from incision. 8. Increased pain, redness, or drainage from the incision   The clinic staff is available to answer your questions during regular business hours (8:30am-5pm).  Please dont hesitate to call and ask to speak to one of our nurses for clinical concerns.   If you have a medical emergency, go to the nearest emergency room or call 911.  A surgeon from [facility] is always on call at the [facility], [place], [place], [place], [place]  [address] ? MAIN: [phone] ? TOLL FREE: [phone] ?  FAX [phone] [email or web address]

## 2014-03-28 NOTE — Discharge Summary (Addendum)
Physician Discharge Summary  Lauren [name] JSE:[id number] DOB: 01-19-1946 DOA: 03/23/2014  PCP: No primary provider on file.  Admit date: 03/23/2014 Discharge date: 03/28/2014  Time spent: 60 minutes  Recommendations for Outpatient Follow-up:  1. PCP in 1 week, check CMP. 2. Followup with Gen. Surgery (Dr. Rosendo Gros)  Discharge Diagnoses:  Principal Problem:   Pancreatitis, acute Active Problems:   Acute cholecystitis   Gallstone pancreatitis   Anxiety state, unspecified   Discharge Condition: Stable  Diet recommendation: Regular  Filed Weights   03/23/14 2104  Weight: 131.044 kg (288 lb 14.4 oz)    History of present illness:  Lauren [name] is a 68 y.o. female who presented to the ED on 03/23/14 with diffuse abdominal pain, N/V, onset on 03/17/14 and has been ongoing since then. She was admitted to [facility] last year for similar pain due to acute gallstone pancreatitis. Unfortunately she did not follow up and have her gallbladder removed due to insurance purposes. Patient was seen on 03/18/14 at [facility], was told labs were "okay" but she needed her gall bladder out due to gallstones. Pain has worsened since then. Primary complaint of epigastric pain with radiation to her upper back.  Hospital Course:   Gallstone Pancreatitis / Cholelithiasis / ?Choledocholithiasis  - US Abdomen: Gallstones, dilated common bile duct 9.33mm.  - No infection suspected on admission (Afebrile. No WBC elevation, No gallbladder wall thickening or pericholecystic fluid) - Elevated AST/ALT/AP improved since admission. T.Billi now WNL. Lipase 25 (03/27/14) (>3000) on admission.  - Surgery and GI consulted. Cholecystectomy advised and performed 03/25/14  - IOC performed during surgery (see below) filling defects suspicious for CBD stones versus air bubbles.  - GI recommended observation for now as patient's LFTs and lipase are improving without ERCP.  - 03/28/14: Pain controlled with PO medications.  Patient feeling  well and requests to go home.  Fever  -Low-grade fever of 99.4 leukocytosis of 15.2. Down to 13.3 (03/28/14) -Urinalysis is clear and chest x-ray without infiltrates.  -Started empirically on Zosyn -Transition to Augmentin on discharge for 7 more days.  Umbilical Hernia with Chronic Incarceration  - Asymptomatic. Repaired during cholecystectomy   Acute Anxiety  -Patient is anxious about blood draws, having pain and the outcome of surgery. -Ativan 0.5 mg 4 times a day when necessary.  -He is to followup with primary care physician, if she needs to put on non-benzodiazepine anxiolytic.  Fluid overload  -Patient had aggressive IV fluid hydration for her acute pancreatitis.  -Chest x-ray from 03/26/14 showed bilateral pleural effusion.  -IV fluids discontinued yesterday, Lasix as needed. -Lungs CTA 03/28/14.  No peripheral edema.   Procedures: Laparoscopic cholecystectomy w/ IOC and primary incarcerated umbilical hernia repair.   Consultations:  Surgery, Gastroenterology  Discharge Exam: Filed Vitals:   03/28/14 0601  BP: 144/77  Pulse: 78  Temp: 98.4 F (36.9 C)  Resp: 20    Physical Exam: General: Awake, alert, and oriented x3.  NAD HEENT: Normocephalic/Atraumatic, conj. clear, anicteric sclera  Neck: Excess adipose tissue. Supple, full ROM  Lungs: Breathing unlabored, CTA b/l anterior/posterior  CV: RRR, no m/g/r appreciated  Abdomen: Protuberant. Non-distended, +BS, soft, Moderate tenderness to palpation RUQ/epigastric.  NO Rebound. Incisions C/D/I.  Extremities: No edema, warm to touch  Skin: No rash or lesions  Neuro: No focal deficits     Medication List         amoxicillin-clavulanate 875-125 MG per tablet  Commonly known as:  AUGMENTIN  Take 1 tablet by mouth  2 (two) times daily.     aspirin-acetaminophen-caffeine 161-096-04 MG per tablet  Commonly known as:  EXCEDRIN MIGRAINE  Take 1 tablet by mouth every 6 (six) hours as needed for headache.      hydrocortisone cream 1 %  Apply topically 2 (two) times daily as needed. Itchy rash.     levothyroxine 100 MCG tablet  Commonly known as:  SYNTHROID, LEVOTHROID  Take 100 mcg by mouth daily.     nystatin 100000 UNIT/ML suspension  Commonly known as:  MYCOSTATIN  Take 5 mLs (500,000 Units total) by mouth 4 (four) times daily.     omeprazole 20 MG capsule  Commonly known as:  PRILOSEC  Take 1 capsule (20 mg total) by mouth daily. For acid reflux.     ondansetron 4 MG tablet  Commonly known as:  ZOFRAN  Take 1 tablet (4 mg total) by mouth every 8 (eight) hours as needed for nausea.     oxyCODONE-acetaminophen 5-325 MG per tablet  Commonly known as:  PERCOCET/ROXICET  Take 1-2 tablets by mouth every 6 (six) hours as needed (For Pain).       Allergies  Allergen Reactions  . Codeine Itching  . Compazine [Prochlorperazine Edisylate]   . Prednisone Nausea And Vomiting  . Vicodin [Hydrocodone-Acetaminophen] Itching      The results of significant diagnostics from this hospitalization (including imaging, microbiology, ancillary and laboratory) are listed below for reference.    Significant Diagnostic Studies: Dg Chest 2 View  03/26/2014   CLINICAL DATA:  Shortness of breath  EXAM: CHEST  2 VIEW  COMPARISON:  03/19/2014  FINDINGS: Moderate cardiac enlargement. There are low lung volumes. Bilateral pleural effusions and bibasilar atelectasis is identified.  IMPRESSION: Low lung volumes, bilateral pleural effusions and bibasilar atelectasis.   Electronically Signed   By: Kerby Moors M.D.   On: 03/26/2014 15:43   Dg Cholangiogram Operative  03/25/2014   CLINICAL DATA:  Laparoscopic cholecystectomy for gallstones  EXAM: INTRAOPERATIVE CHOLANGIOGRAM  FLUOROSCOPY TIME:  28 seconds  COMPARISON:  Abdominal ultrasound - 03/23/2014; abdominal CT -03/19/2014; nuclear medicine HIDA scan - 03/20/2014  FINDINGS: Intraoperative angiographic images of the right upper abdominal quadrant during  laparoscopic cholecystectomy are provided for review.  Surgical clips overlie the expected location of the gallbladder fossa.  Initial contrast injection demonstrates either opacification of the gallbladder neck or extravasation of contrast about the central aspect of the gallbladder fossa.  Subsequent contrast injection demonstrates apparent opacification of the central aspect of the right posterior biliary duct.  There is passage of contrast through the apparent right posterior biliary duct with filling of a non dilated common bile duct. There is passage of contrast though the CBD and into the descending portion of the duodenum.  There are multiple apparent filling defects within the moderately dilated common bile duct which while possibly representative of air bubbles are worrisome for choledocholithiasis.  There is minimal reflux of injected contrast into the common hepatic duct and central aspect of the non dilated intrahepatic biliary system.  IMPRESSION: 1. 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 2. Multiple nonocclusive filling defects are seen within the moderately dilated common bile duct - while possibly represent of air bubbles, are worrisome for choledocholithiasis. Clinical correlation is advised. Further evaluation could be performed with ERCP as clinically indicated. These results will be called to the ordering clinician or representative by the Radiologist Assistant, and communication documented in the PACS Dashboard.   Electronically Signed  By: Sandi Mariscal M.D.   On: 03/25/2014 11:10   US Abdomen Limited Ruq  03/23/2014   CLINICAL DATA:  Abdominal pain.  EXAM: US ABDOMEN LIMITED - RIGHT UPPER QUADRANT  COMPARISON:  12/04/2012  FINDINGS: Gallbladder:  Multiple echogenic stones are identified within the gallbladder. These measure up to 3 mm. No gallbladder wall thickening or pericholecystic fluid. Positive sonographic Murphy's sign.  Common bile  duct:  Diameter: Increased in caliber measuring 9.8 mm.  Liver:  Diffuse increased parenchymal echogenicity. No focal lesion identified.  IMPRESSION: 1. Gallstones and positive sonographic Murphy's side. Cannot rule out acute cholecystitis. 2. Increase caliber of the common bile duct. If there is a clinical concern for choledocholithiasis then an MRCP may be helpful for further assessment.   Electronically Signed   By: Kerby Moors M.D.   On: 03/23/2014 19:07    Microbiology: Recent Results (from the past 240 hour(s))  SURGICAL PCR SCREEN     Status: None   Collection Time    03/24/14  4:29 AM      Result Value Ref Range Status   MRSA, PCR NEGATIVE  NEGATIVE Final   Staphylococcus aureus NEGATIVE  NEGATIVE Final   Comment:            The Xpert SA Assay (FDA     approved for NASAL specimens     in patients over 69 years of age),     is one component of     a comprehensive surveillance     program.  Test performance has     been validated by [employer] for patients greater     than or equal to 73 year old.     It is not intended     to diagnose infection nor to     guide or monitor treatment.  CULTURE, BLOOD (ROUTINE X 2)     Status: None   Collection Time    03/27/14 10:10 AM      Result Value Ref Range Status   Specimen Description BLOOD LEFT ARM   Final   Special Requests BOTTLES DRAWN AEROBIC ONLY 5CC   Final   Culture  Setup Time     Final   Value: 03/27/2014 18:52     Performed at [facility]   Culture     Final   Value:        BLOOD CULTURE RECEIVED NO GROWTH TO DATE CULTURE WILL BE HELD FOR 5 DAYS BEFORE ISSUING A FINAL NEGATIVE REPORT     Performed at [facility]   Report Status PENDING   Incomplete  CULTURE, BLOOD (ROUTINE X 2)     Status: None   Collection Time    03/27/14 10:15 AM      Result Value Ref Range Status   Specimen Description BLOOD RIGHT ARM   Final   Special Requests BOTTLES DRAWN AEROBIC ONLY [facility]   Final   Culture  Setup Time      Final   Value: 03/27/2014 18:52     Performed at [facility]   Culture     Final   Value:        BLOOD CULTURE RECEIVED NO GROWTH TO DATE CULTURE WILL BE HELD FOR 5 DAYS BEFORE ISSUING A FINAL NEGATIVE REPORT     Performed at [facility]   Report Status PENDING   Incomplete     Labs: Basic Metabolic Panel:  Recent Labs Lab 03/24/14  0710 03/25/14 0740 03/26/14 0552 03/27/14 0600 03/28/14 0435  NA 137 140 140 136* 135*  K 3.9 3.7 4.8 3.4* 3.4*  CL 103 106 105 99 97  CO2 22 23 22 24 27   GLUCOSE 77 91 136* 117* 107*  BUN 8 4* 4* 3* 5*  CREATININE 0.64 0.59 0.70 0.62 0.60  CALCIUM 8.2* 8.1* 8.2* 8.3* 8.4   Liver Function Tests:  Recent Labs Lab 03/24/14 0710 03/25/14 0740 03/26/14 0552 03/27/14 0600 03/28/14 0435  AST 100* 37 47* 27 29  ALT 86* 54* 51* 36* 36*  ALKPHOS 159* 128* 132* 123* 161*  BILITOT 0.6 0.4 0.5 0.7 0.6  PROT 6.4 6.0 6.3 6.3 6.0  ALBUMIN 2.8* 2.5* 2.6* 2.5* 2.3*    Recent Labs Lab 03/23/14 1714 03/24/14 1452 03/25/14 0740 03/26/14 0552 03/27/14 0600  LIPASE >3000* 400* 81* 231* 25   CBC:  Recent Labs Lab 03/23/14 1714 03/24/14 0710 03/25/14 1312 03/27/14 0600 03/28/14 0435  WBC 10.1 7.9 14.9* 15.2* 13.3*  NEUTROABS 6.9  --   --   --   --   HGB 14.2 12.1 12.5 11.5* 10.7*  HCT 42.2 37.7 38.8 35.5* 33.6*  MCV 84.9 86.3 87.0 86.4 86.6  PLT 259 200 187 179 188    Signed:  Rudean Haskell, PA-S [facility] 03/28/2014, 10:48 AM     Addendum  Patient seen and examined, chart and data base reviewed.  I agree with the above assessment and plan.  For full details please see Mr. Rudean Haskell, PA-S note.  I reviewed an amended the above was appropriate.   Birdie Hopes, MD [facility] Pager: [phone] 03/28/2014, 12:36 PM

## 2014-03-28 NOTE — Progress Notes (Signed)
Nsg Discharge Note  Admit Date:  03/23/2014 Discharge date: 03/28/2014   Lauren [name] to be D/C'd Home per MD order.  AVS completed.  Copy for chart, and copy for patient signed, and dated. Patient/caregiver able to verbalize understanding.  Discharge Medication:   Medication List         amoxicillin-clavulanate 875-125 MG per tablet  Commonly known as:  AUGMENTIN  Take 1 tablet by mouth 2 (two) times daily.     aspirin-acetaminophen-caffeine 409-811-91 MG per tablet  Commonly known as:  EXCEDRIN MIGRAINE  Take 1 tablet by mouth every 6 (six) hours as needed for headache.     hydrocortisone cream 1 %  Apply topically 2 (two) times daily as needed. Itchy rash.     levothyroxine 100 MCG tablet  Commonly known as:  SYNTHROID, LEVOTHROID  Take 100 mcg by mouth daily.     nystatin 100000 UNIT/ML suspension  Commonly known as:  MYCOSTATIN  Take 5 mLs (500,000 Units total) by mouth 4 (four) times daily.     omeprazole 20 MG capsule  Commonly known as:  PRILOSEC  Take 1 capsule (20 mg total) by mouth daily. For acid reflux.     ondansetron 4 MG tablet  Commonly known as:  ZOFRAN  Take 1 tablet (4 mg total) by mouth every 8 (eight) hours as needed for nausea.     oxyCODONE-acetaminophen 5-325 MG per tablet  Commonly known as:  PERCOCET/ROXICET  Take 1-2 tablets by mouth every 6 (six) hours as needed (For Pain).        Discharge Assessment: Filed Vitals:   03/28/14 0601  BP: 144/77  Pulse: 78  Temp: 98.4 F (36.9 C)  Resp: 20   Skin clean, dry and intact without evidence of skin break down, no evidence of skin tears noted. IV catheter discontinued intact. Site without signs and symptoms of complications - no redness or edema noted at insertion site, patient denies c/o pain - only slight tenderness at site.  Dressing with slight pressure applied.  D/c Instructions-Education: Discharge instructions given to patient/family with verbalized understanding. D/c education  completed with patient/family including follow up instructions, medication list, d/c activities limitations if indicated, with other d/c instructions as indicated by MD - patient able to verbalize understanding, all questions fully answered. Patient instructed to return to ED, call 911, or call MD for any changes in condition.  Patient escorted via [place], and D/C home via private auto.  [name], RN 03/28/2014 3:56 PM  Nsg Discharge Note  Admit Date:  03/23/2014 Discharge date: 03/28/2014   Lauren [name] to be D/C'd Home per MD order.  AVS completed.  Copy for chart, and copy for patient signed, and dated. Patient/caregiver able to verbalize understanding.  Discharge Medication:   Medication List         amoxicillin-clavulanate 875-125 MG per tablet  Commonly known as:  AUGMENTIN  Take 1 tablet by mouth 2 (two) times daily.     aspirin-acetaminophen-caffeine 478-295-62 MG per tablet  Commonly known as:  EXCEDRIN MIGRAINE  Take 1 tablet by mouth every 6 (six) hours as needed for headache.     hydrocortisone cream 1 %  Apply topically 2 (two) times daily as needed. Itchy rash.     levothyroxine 100 MCG tablet  Commonly known as:  SYNTHROID, LEVOTHROID  Take 100 mcg by mouth daily.     nystatin 100000 UNIT/ML suspension  Commonly known as:  MYCOSTATIN  Take 5 mLs (500,000 Units total)  by mouth 4 (four) times daily.     omeprazole 20 MG capsule  Commonly known as:  PRILOSEC  Take 1 capsule (20 mg total) by mouth daily. For acid reflux.     ondansetron 4 MG tablet  Commonly known as:  ZOFRAN  Take 1 tablet (4 mg total) by mouth every 8 (eight) hours as needed for nausea.     oxyCODONE-acetaminophen 5-325 MG per tablet  Commonly known as:  PERCOCET/ROXICET  Take 1-2 tablets by mouth every 6 (six) hours as needed (For Pain).        Discharge Assessment: Filed Vitals:   03/28/14 0601  BP: 144/77  Pulse: 78  Temp: 98.4 F (36.9 C)  Resp: 20   Skin clean, dry  and intact without evidence of skin break down, no evidence of skin tears noted. Does have lap sites on abd. IV catheter discontinued intact. Site without signs and symptoms of complications - no redness or edema noted at insertion site, patient denies c/o pain - only slight tenderness at site.  Dressing with slight pressure applied.  D/c Instructions-Education: Discharge instructions given to patient/family with verbalized understanding. D/c education completed with patient/family including follow up instructions, medication list, d/c activities limitations if indicated, with other d/c instructions as indicated by MD - patient able to verbalize understanding, all questions fully answered. Patient instructed to return to ED, call 911, or call MD for any changes in condition.  Patient escorted via [place], and D/C home via private auto.  Kalasia Crafton Elaine Taria Castrillo, RN 03/28/2014 3:56 PM

## 2014-03-29 ENCOUNTER — Encounter (HOSPITAL_COMMUNITY): Payer: Self-pay | Admitting: General Surgery

## 2014-03-29 NOTE — ED Provider Notes (Signed)
Medical screening examination/treatment/procedure(s) were performed by non-physician practitioner and as supervising physician I was immediately available for consultation/collaboration.   EKG Interpretation   Date/Time:  Wednesday March 23 2014 20:27:48 EDT Ventricular Rate:  64 PR Interval:  175 QRS Duration: 106 QT Interval:  470 QTC Calculation: 485 R Axis:   42 Text Interpretation:  Age not entered, assumed to be  68 years old for  purpose of ECG interpretation Sinus rhythm Borderline prolonged QT  interval ED PHYSICIAN INTERPRETATION AVAILABLE IN [facility] [place]  Confirmed by TEST, Record ([id number]) on 03/25/2014 12:31:01 PM       Threasa Beards, MD 03/29/14 [id number]

## 2014-04-02 LAB — CULTURE, BLOOD (ROUTINE X 2)
CULTURE: NO GROWTH
Culture: NO GROWTH

## 2014-04-19 ENCOUNTER — Ambulatory Visit (INDEPENDENT_AMBULATORY_CARE_PROVIDER_SITE_OTHER): Payer: Medicare HMO | Admitting: General Surgery

## 2014-04-19 ENCOUNTER — Encounter (INDEPENDENT_AMBULATORY_CARE_PROVIDER_SITE_OTHER): Payer: Self-pay | Admitting: General Surgery

## 2014-04-19 VITALS — BP 124/62 | HR 80 | Temp 98.0°F | Resp 18 | Ht 62.0 in | Wt 265.0 lb

## 2014-04-19 DIAGNOSIS — K8000 Calculus of gallbladder with acute cholecystitis without obstruction: Secondary | ICD-10-CM

## 2014-04-19 NOTE — Progress Notes (Signed)
Lauren [name] Jun 17, 1946 [id number] 04/19/2014   Lauren [name] is a 68 y.o. female who had a laparoscopic cholecystectomy with intraoperative cholangiogram and primary repair of umbilical hernia by Dr. Ralene Ok.  The pathology report confirmed chronic cholecystitis with cholelithiasis.  The patient reports that they are feeling well with normal bowel movements and good appetite.  The pre-operative symptoms of abdominal pain, nausea, and vomiting have resolved.    Physical examination - Incisions appear well-healed with no sign of infection or bleeding.   Abdomen - soft, non-tender  Impression:  s/p laparoscopic cholecystectomy  Plan:  She may resume a regular diet and full activity.  She may follow-up on a PRN basis.

## 2014-04-19 NOTE — Patient Instructions (Signed)
Follow up as needed

## 2014-11-03 ENCOUNTER — Other Ambulatory Visit (HOSPITAL_COMMUNITY): Payer: Self-pay | Admitting: Family Medicine

## 2014-11-03 DIAGNOSIS — R609 Edema, unspecified: Secondary | ICD-10-CM

## 2014-11-04 ENCOUNTER — Ambulatory Visit (HOSPITAL_COMMUNITY)
Admission: RE | Admit: 2014-11-04 | Discharge: 2014-11-04 | Disposition: A | Discharge status: Home / Self Care | Payer: Medicare HMO | Source: Ambulatory Visit | Attending: Family Medicine | Admitting: Family Medicine

## 2014-11-04 DIAGNOSIS — M7989 Other specified soft tissue disorders: Secondary | ICD-10-CM | POA: Diagnosis present

## 2014-11-04 DIAGNOSIS — R609 Edema, unspecified: Secondary | ICD-10-CM

## 2015-04-20 IMAGING — RF DG CHOLANGIOGRAM OPERATIVE
1 series · 11 of 11 positions shown · non-contrast
Comparison: Abdominal ultrasound - 03/23/2014; abdominal CT
-03/19/2014; nuclear medicine HIDA scan - 03/20/2014

CLINICAL DATA: Laparoscopic cholecystectomy for gallstones

EXAM:
INTRAOPERATIVE CHOLANGIOGRAM
FLUOROSCOPY TIME:  28 seconds

[Series 1: run · 5 acquisitions, 11 frames shown]
[im 1/5]
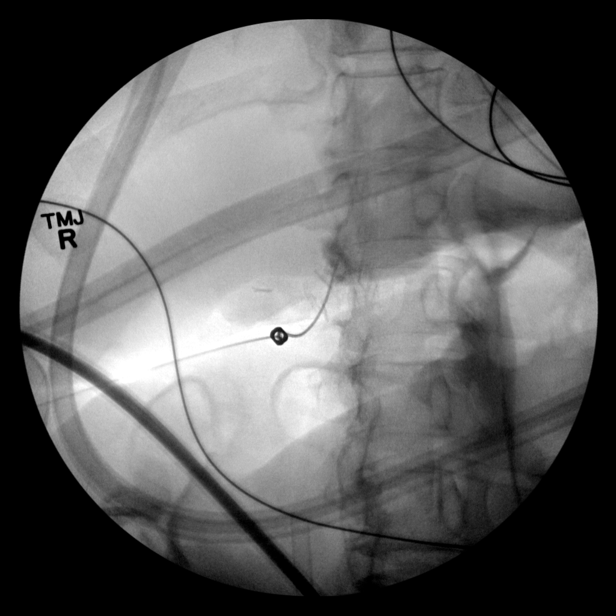
[im 1/5]
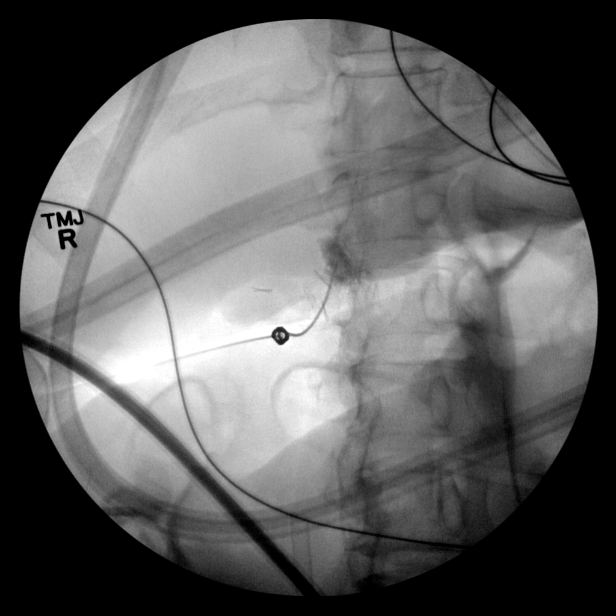
[im 1/5]
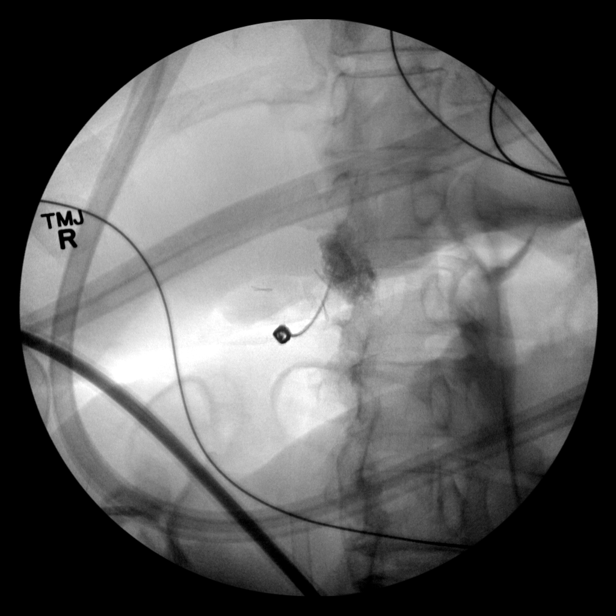
[im 1/5]
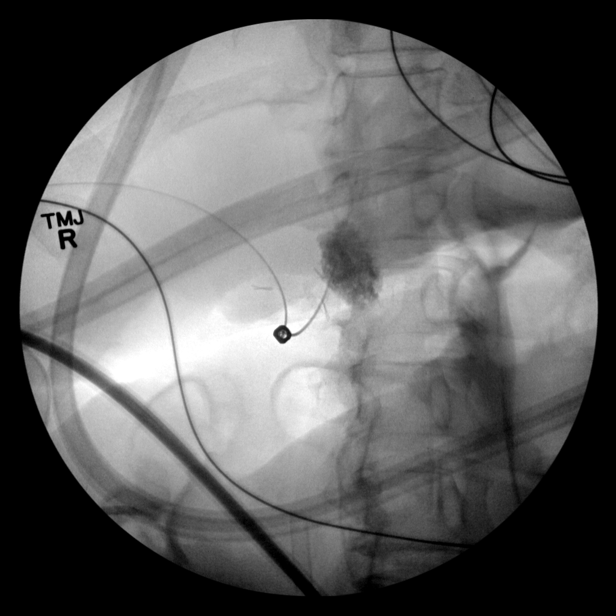
[im 2/5]
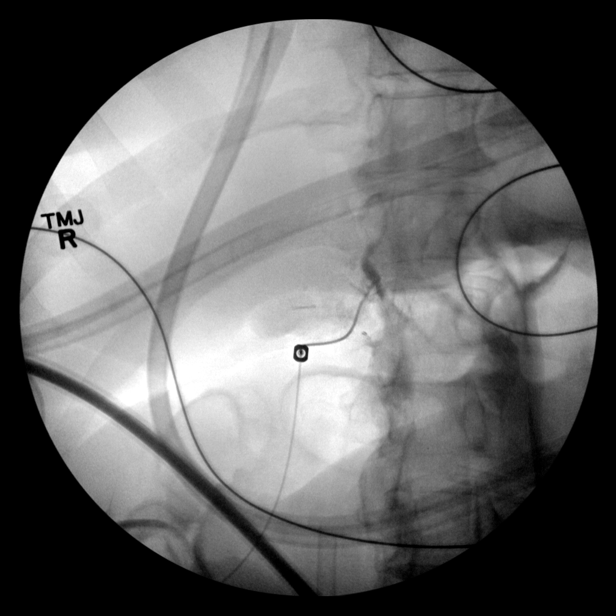
[im 2/5]
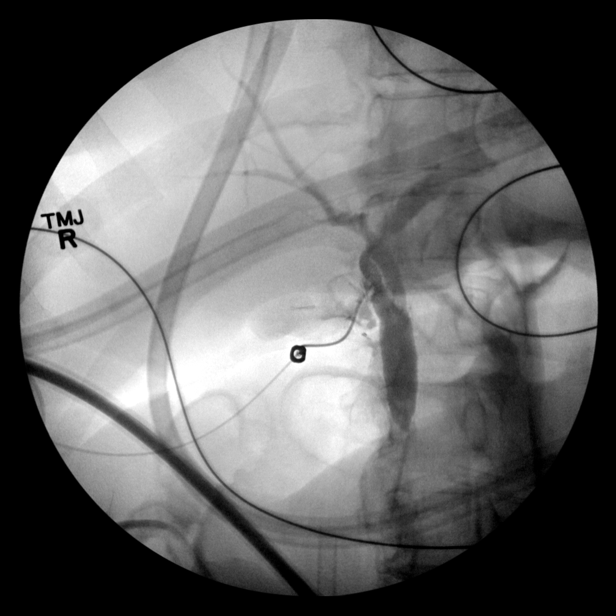
[im 2/5]
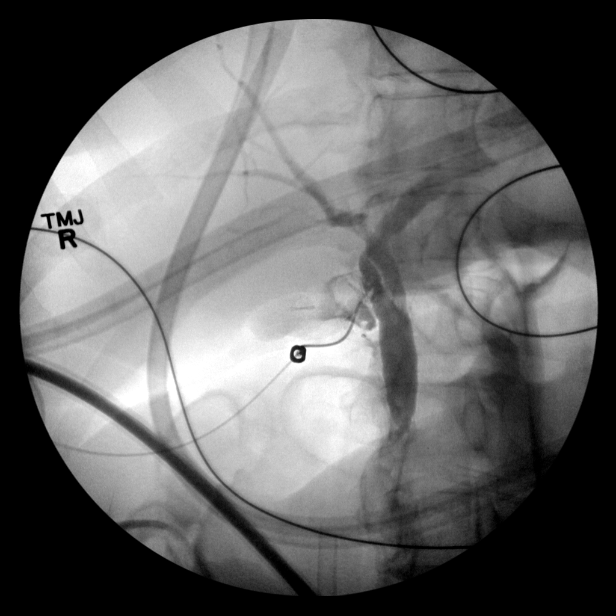
[im 2/5]
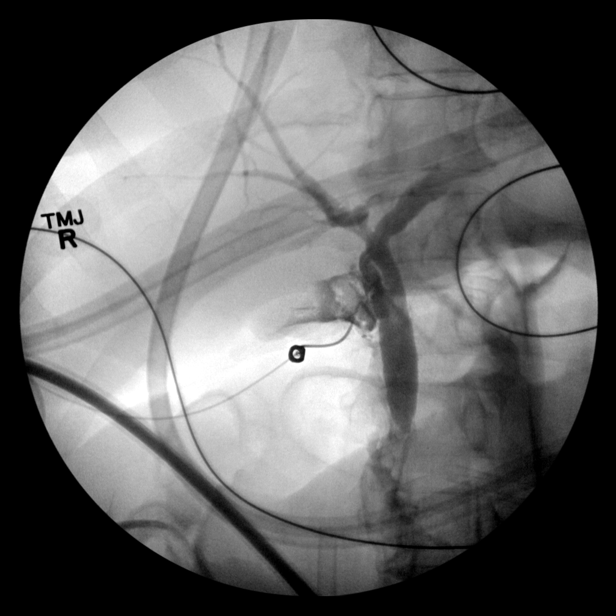
[im 3/5]
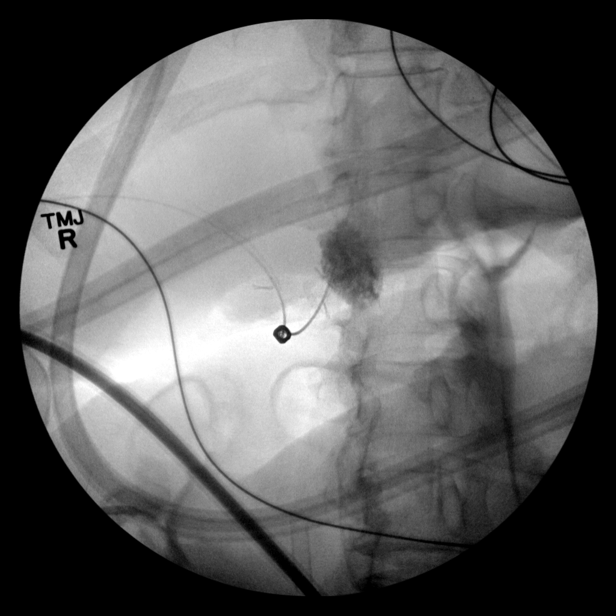
[im 4/5]
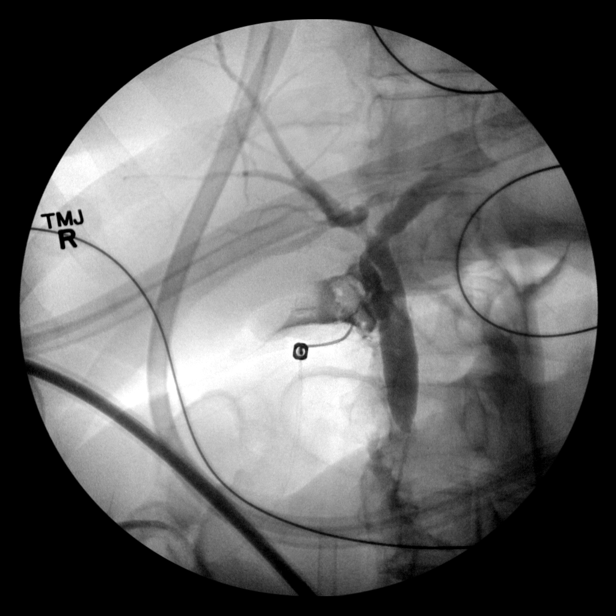
[im 5/5]
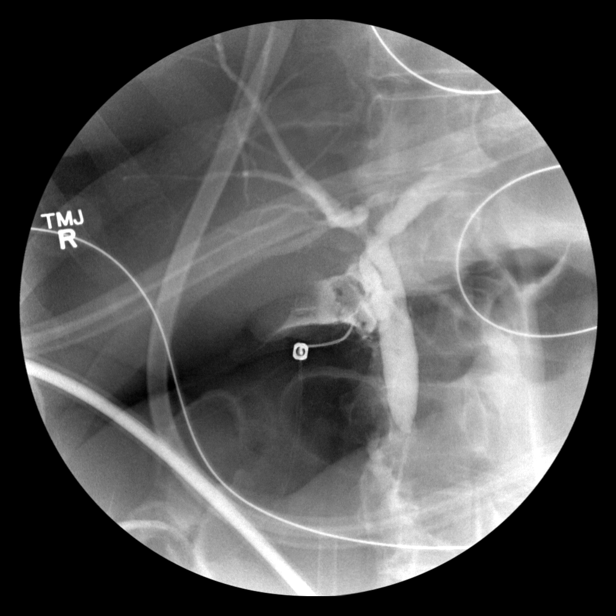

[11 of 11 positions shown; findings below may reference images not displayed]

FINDINGS: Intraoperative angiographic images of the right upper abdominal
quadrant during laparoscopic cholecystectomy are provided for
review.

Surgical clips overlie the expected location of the gallbladder
fossa.

Initial contrast injection demonstrates either opacification of the
gallbladder neck or extravasation of contrast about the central
aspect of the gallbladder fossa.

Subsequent contrast injection demonstrates apparent opacification of
the central aspect of the right posterior biliary duct.

There is passage of contrast through the apparent right posterior
biliary duct with filling of a non dilated common bile duct. There
is passage of contrast though the CBD and into the descending
portion of the duodenum.

There are multiple apparent filling defects within the moderately
dilated common bile duct which while possibly representative of air
bubbles are worrisome for choledocholithiasis.

There is minimal reflux of injected contrast into the common hepatic
duct and central aspect of the non dilated intrahepatic biliary
system.
IMPRESSION: 1. Suspected anomalous insertion of the cystic duct into the right
posterior biliary duct.
2. Multiple nonocclusive filling defects are seen within the
moderately dilated common bile duct - while possibly represent of
air bubbles, are worrisome for choledocholithiasis. Clinical
correlation is advised. Further evaluation could be performed with
ERCP as clinically indicated.
These results will be called to the ordering clinician or
representative by the Radiologist Assistant, and communication
documented in the PACS Dashboard.

## 2015-07-25 ENCOUNTER — Encounter: Payer: Self-pay | Admitting: Internal Medicine

## 2016-09-23 NOTE — Progress Notes (Deleted)
Cardiology Office Note   Date:  09/23/2016   ID:  Mckinney, Glass 01-Jun-1946, MRN [id number]  PCP:  No primary care provider on file.  Cardiologist:   Minus Breeding, MD  Referring:  ***  No chief complaint on file.     History of Present Illness: Lauren [name] is a 70 y.o. female who presents for ***  She did have an echo in 2014 with mildly calcified aortic leaflets.    Past Medical History:  Diagnosis Date  . Acute cholecystitis 12/06/2012  . Atrial fibrillation with RVR 12/06/2012  . Diverticulosis   . Hypothyroidism   . Obesity   . Pancreatitis, acute 12/04/2012  . Periumbilical hernia     Past Surgical History:  Procedure Laterality Date  . CHOLECYSTECTOMY N/A 03/25/2014   Procedure: LAPAROSCOPIC CHOLECYSTECTOMY WITH INTRAOPERATIVE CHOLANGIOGRAM;  Surgeon: Ralene Ok, MD;  Location: [facility];  Service: General;  Laterality: N/A;     Current Outpatient Prescriptions  Medication Sig Dispense Refill  . aspirin-acetaminophen-caffeine (EXCEDRIN MIGRAINE) 250-250-65 MG per tablet Take 1 tablet by mouth every 6 (six) hours as needed for headache.    . hydrocortisone cream 1 % Apply topically 2 (two) times daily as needed. Itchy rash.    . levothyroxine (SYNTHROID, LEVOTHROID) 100 MCG tablet Take 100 mcg by mouth daily.    Marland Kitchen nystatin (MYCOSTATIN) 100000 UNIT/ML suspension Take 5 mLs (500,000 Units total) by mouth 4 (four) times daily. 480 mL 0  . omeprazole (PRILOSEC) 20 MG capsule Take 1 capsule (20 mg total) by mouth daily. For acid reflux. 30 capsule 2  . ondansetron (ZOFRAN) 4 MG tablet Take 1 tablet (4 mg total) by mouth every 8 (eight) hours as needed for nausea. 20 tablet 0  . oxyCODONE-acetaminophen (PERCOCET/ROXICET) 5-325 MG per tablet Take 1-2 tablets by mouth every 6 (six) hours as needed (For Pain). 20 tablet 0   No current facility-administered medications for this visit.     Allergies:   Codeine; Compazine [prochlorperazine edisylate];  Prednisone; and Vicodin [hydrocodone-acetaminophen]    Social History:  The patient  reports that she has never smoked. She does not have any smokeless tobacco history on file. She reports that she does not drink alcohol or use drugs.   Family History:  The patient's ***family history includes Cancer in her father; Diabetes in her maternal grandfather; Heart disease in her maternal grandmother; Hypertension in her brother; Kidney disease in her maternal grandfather; Kidney failure in her brother.    ROS:  Please see the history of present illness.   Otherwise, review of systems are positive for {NONE DEFAULTED:18576::"none"}.   All other systems are reviewed and negative.    PHYSICAL EXAM: VS:  There were no vitals taken for this visit. , BMI There is no height or weight on file to calculate BMI. GENERAL:  Well appearing HEENT:  Pupils equal round and reactive, fundi not visualized, oral mucosa unremarkable NECK:  No jugular venous distention, waveform within normal limits, carotid upstroke brisk and symmetric, no bruits, no thyromegaly LYMPHATICS:  No cervical, inguinal adenopathy LUNGS:  Clear to auscultation bilaterally BACK:  No CVA tenderness CHEST:  Unremarkable HEART:  PMI not displaced or sustained,S1 and S2 within normal limits, no S3, no S4, no clicks, no rubs, *** murmurs ABD:  Flat, positive bowel sounds normal in frequency in pitch, no bruits, no rebound, no guarding, no midline pulsatile mass, no hepatomegaly, no splenomegaly EXT:  2 plus pulses throughout, no edema, no cyanosis no  clubbing SKIN:  No rashes no nodules NEURO:  Cranial nerves II through XII grossly intact, motor grossly intact throughout PSYCH:  Cognitively intact, oriented to person place and time    EKG:  EKG {ACTION; IS/IS [id number] ordered today. The ekg ordered today demonstrates ***   Recent Labs: No results found for requested labs within last 8760 hours.    Lipid Panel    Component Value  Date/Time   CHOL 190 12/05/2012 0531   TRIG 157 (H) 12/05/2012 0531   HDL 70 12/05/2012 0531   CHOLHDL 2.7 12/05/2012 0531   VLDL 31 12/05/2012 0531   LDLCALC 89 12/05/2012 0531      Wt Readings from Last 3 Encounters:  04/19/14 265 lb (120.2 kg)  03/23/14 288 lb 14.4 oz (131 kg)  12/04/12 (!) 314 lb (142.4 kg)      Other studies Reviewed: Additional studies/ records that were reviewed today include: ***. Review of the above records demonstrates:  Please see elsewhere in the note.  ***   ASSESSMENT AND PLAN:  ***   Current medicines are reviewed at length with the patient today.  The patient {ACTIONS; HAS/DOES NOT HAVE:19233} concerns regarding medicines.  The following changes have been made:  {PLAN; NO CHANGE:13088:s}  Labs/ tests ordered today include: *** No orders of the defined types were placed in this encounter.    Disposition:   FU with ***    Signed, Minus Breeding, MD  09/23/2016 8:14 AM    [facility]

## 2016-09-24 ENCOUNTER — Encounter: Payer: Medicare HMO | Admitting: Cardiology

## 2016-10-11 ENCOUNTER — Encounter: Payer: Self-pay | Admitting: *Deleted

## 2016-10-14 ENCOUNTER — Ambulatory Visit (INDEPENDENT_AMBULATORY_CARE_PROVIDER_SITE_OTHER): Payer: Medicare HMO | Admitting: Cardiology

## 2016-10-14 ENCOUNTER — Encounter: Payer: Self-pay | Admitting: *Deleted

## 2016-10-14 ENCOUNTER — Encounter: Payer: Self-pay | Admitting: Cardiology

## 2016-10-14 VITALS — BP 129/76 | HR 81 | Ht 62.0 in | Wt 274.0 lb

## 2016-10-14 DIAGNOSIS — I5032 Chronic diastolic (congestive) heart failure: Secondary | ICD-10-CM

## 2016-10-14 DIAGNOSIS — I890 Lymphedema, not elsewhere classified: Secondary | ICD-10-CM

## 2016-10-14 DIAGNOSIS — I4891 Unspecified atrial fibrillation: Secondary | ICD-10-CM

## 2016-10-14 DIAGNOSIS — I10 Essential (primary) hypertension: Secondary | ICD-10-CM

## 2016-10-14 MED ORDER — DILTIAZEM HCL ER COATED BEADS 180 MG PO CP24: 180.0000 mg | ORAL_CAPSULE | Freq: Every day | ORAL | 3 refills | Status: DC

## 2016-10-14 MED ORDER — FUROSEMIDE 20 MG PO TABS: 20.0000 mg | ORAL_TABLET | Freq: Every day | ORAL | 3 refills | Status: DC

## 2016-10-14 MED ORDER — RIVAROXABAN 20 MG PO TABS
20.0000 mg | ORAL_TABLET | Freq: Every day | ORAL | 6 refills | Status: DC
Start: 2016-10-14 — End: 2017-04-17

## 2016-10-14 MED ORDER — POTASSIUM CHLORIDE CRYS ER 20 MEQ PO TBCR: 20.0000 meq | EXTENDED_RELEASE_TABLET | Freq: Every day | ORAL | 3 refills | Status: DC

## 2016-10-14 MED ORDER — METOPROLOL TARTRATE 25 MG PO TABS: 37.5000 mg | ORAL_TABLET | Freq: Two times a day (BID) | ORAL | 3 refills | Status: DC

## 2016-10-14 NOTE — Patient Instructions (Signed)
Medication Instructions:  Stop DIGOX IMCREASE LOPRESSOR TO 37.5 MG - TWO TIMES DAILY INCREASE XARELTO 20 MG ONCE DAILY  Labwork: Your physician recommends that you return for lab work in: TODAY CBC MAGNESIUM CMET TSH   Testing/Procedures: NONE  Follow-Up: Your physician recommends that you schedule a follow-up appointment in: 3-4 WEEKS    Any Other Special Instructions Will Be Listed Below (If Applicable). I WILL REQUEST A COPY OF LAB WORK FROM [facility]     If you need a refill on your cardiac medications before your next appointment, please call your pharmacy.

## 2016-10-14 NOTE — Progress Notes (Signed)
Clinical Summary Lauren [name] is a 70 y.o.female seen as a new patient.She is referred by Dr Mckinley Jewel  1. Afib - recent admit to [facility] 08/2016, found to be in afib with RVR. New diagnosis at that time. - rare palpitations.  - only on 10mg  of xarelto for unclear reasons.  - discussed cardioversion    2. Lymphedema - compliant with diuretic  3. HTN - compliant with meds  4. Chronic diastolic heart failure - recent admission 08/2016 with acute CHF - diuresed up to 3 liters during admission. - echo 08/2016 showed LVEF 50-55%, mild MR,   - weight was down to 264 lbs about 4-5 days ago. Slow uptrend, now up to 269 lbs. Swelling is improving but not resolved - taking lasix 40mg  daily .  5. Left pleural effusion - moderate by CXR during 08/2016 admission - SOB is improving.   6. OSA screen - not sure if snores, unsure of apneic episodes. Denies daytime somnolence.   Past Medical History:  Diagnosis Date  . Acute cholecystitis 12/06/2012  . Atrial fibrillation with RVR 12/06/2012  . Diverticulosis   . Hypothyroidism   . Obesity   . Pancreatitis, acute 12/04/2012  . Periumbilical hernia      Allergies  Allergen Reactions  . Codeine Itching  . Compazine [Prochlorperazine Edisylate]   . Prednisone Nausea And Vomiting  . Vicodin [Hydrocodone-Acetaminophen] Itching     Current Outpatient Prescriptions  Medication Sig Dispense Refill  . aspirin-acetaminophen-caffeine (EXCEDRIN MIGRAINE) 250-250-65 MG per tablet Take 1 tablet by mouth every 6 (six) hours as needed for headache.    . hydrocortisone cream 1 % Apply topically 2 (two) times daily as needed. Itchy rash.    . levothyroxine (SYNTHROID, LEVOTHROID) 100 MCG tablet Take 100 mcg by mouth daily.    Marland Kitchen nystatin (MYCOSTATIN) 100000 UNIT/ML suspension Take 5 mLs (500,000 Units total) by mouth 4 (four) times daily. 480 mL 0  . omeprazole (PRILOSEC) 20 MG capsule Take 1 capsule (20 mg total) by mouth daily.  For acid reflux. 30 capsule 2  . ondansetron (ZOFRAN) 4 MG tablet Take 1 tablet (4 mg total) by mouth every 8 (eight) hours as needed for nausea. 20 tablet 0  . oxyCODONE-acetaminophen (PERCOCET/ROXICET) 5-325 MG per tablet Take 1-2 tablets by mouth every 6 (six) hours as needed (For Pain). 20 tablet 0   No current facility-administered medications for this visit.      Past Surgical History:  Procedure Laterality Date  . CHOLECYSTECTOMY N/A 03/25/2014   Procedure: LAPAROSCOPIC CHOLECYSTECTOMY WITH INTRAOPERATIVE CHOLANGIOGRAM;  Surgeon: Ralene Ok, MD;  Location: [place];  Service: General;  Laterality: N/A;     Allergies  Allergen Reactions  . Codeine Itching  . Compazine [Prochlorperazine Edisylate]   . Prednisone Nausea And Vomiting  . Vicodin [Hydrocodone-Acetaminophen] Itching      Family History  Problem Relation Age of Onset  . Cancer Father     liver  . Hypertension Brother   . Heart disease Maternal Grandmother   . Kidney disease Maternal Grandfather   . Diabetes Maternal Grandfather   . Kidney failure Brother      Social History [name] reports that she has never smoked. She does not have any smokeless tobacco history on file. [name] reports that she does not drink alcohol.   Review of Systems CONSTITUTIONAL: No weight loss, fever, chills, weakness or fatigue.  HEENT: Eyes: No visual loss, blurred vision, double vision or yellow sclerae.No hearing loss, sneezing,  congestion, runny nose or sore throat.  SKIN: No rash or itching.  CARDIOVASCULAR: per HPI RESPIRATORY: No shortness of breath, cough or sputum.  GASTROINTESTINAL: No anorexia, nausea, vomiting or diarrhea. No abdominal pain or blood.  GENITOURINARY: No burning on urination, no polyuria NEUROLOGICAL: No headache, dizziness, syncope, paralysis, ataxia, numbness or tingling in the extremities. No change in bowel or bladder control.  MUSCULOSKELETAL: No muscle, back pain, joint pain or  stiffness.  LYMPHATICS: No enlarged nodes. No history of splenectomy.  PSYCHIATRIC: No history of depression or anxiety.  ENDOCRINOLOGIC: No reports of sweating, cold or heat intolerance. No polyuria or polydipsia.  Marland Kitchen   Physical Examination Vitals:   10/14/16 0828  BP: 129/76  Pulse: 81   Vitals:   10/14/16 0828  Weight: 274 lb (124.3 kg)  Height: 5\' 2"  (1.575 m)    Gen: resting comfortably, no acute distress HEENT: no scleral icterus, pupils equal round and reactive, no palptable cervical adenopathy,  CV: irreg, no m/r/g, no jvd Resp: Clear to auscultation bilaterally GI: abdomen is soft, non-tender, non-distended, normal bowel sounds, no hepatosplenomegaly MSK: extremities are warm, bilateral nonpitting LE edema Skin: warm, no rash Neuro:  no focal deficits Psych: appropriate affect     Assessment and Plan  1. Afib - no curent symptoms. EKG in clinic shows afib normal rate - continue rate control with dilt and metoprolol, we will d/c digoxin and increase lopressor to 37.5mg  bid.  - continue stroke prevention with xarelto, she should be on 20mg  daily. CHADS2Vasc score is 3 - discussed possible DCCV, she is not in favor at this time. She has been on subtherapeutic xarelto dosing, would need to be on therapeutic dosing x 3 weeks prior to consideration. Change xarelto to 20mg  daily   2. Lymphedema - LE edema appears to be combined lymphedema and CHF - continue diuresis at this time, once euvolemic refer to lymphedema clinic  3. Chronic diastolic HF - continue diuretics, counseled ok to take additional lasix for weight gain or continued swelling - repeat labs  4. Pleural effusion - will need repeat CXR in next few weeks to make sure resolving  5. OSA screen - negative screening history. She is obese with history of afib, diastolic HF and HTN. Will consider sleep testing in the future  6. HTN - at goal, continue current meds      Arnoldo Lenis, M.D.

## 2016-10-21 ENCOUNTER — Telehealth: Payer: Self-pay | Admitting: Cardiology

## 2016-10-21 NOTE — Telephone Encounter (Signed)
Patient needing clarification on her Lasix & Lopressor.  Stated that she had 2 different doses sent in on the Lasix.  Most recently Lasix 20mg  daily was sent in from [place] office, but patient stated a different script was sent from [place] previously.  Office note from Dr. Harl Bowie states Lasix is 40 daily.  Please clarify.  Also the Lopressor was recently increased to 1 1/2 tabs BID.  Stated she is having a very difficult time cutting them in half.  Would like to just go back to the whole tab BID if possible.    Patient does already have follow up scheduled for 12/21/20107 in [place] office.

## 2016-10-21 NOTE — Telephone Encounter (Signed)
Mrs. Ramsier called very concerned about taking her medications.  She would like to speak with a nurse as soon as she can.

## 2016-10-22 MED ORDER — FUROSEMIDE 20 MG PO TABS: 40.0000 mg | ORAL_TABLET | Freq: Every day | ORAL | 3 refills | Status: DC

## 2016-10-22 NOTE — Telephone Encounter (Signed)
Pt aware - updated medication list  

## 2016-10-22 NOTE — Telephone Encounter (Signed)
Lasix should be 40mg  daily. Ok to change lopressor back to 25mg  bid   Zandra Abts MD

## 2016-10-24 ENCOUNTER — Telehealth: Payer: Self-pay | Admitting: *Deleted

## 2016-10-24 NOTE — Telephone Encounter (Signed)
-----   Message from Arnoldo Lenis, MD sent at 10/22/2016 12:41 PM EST ----- Labs overall look good  Zandra Abts MD

## 2016-10-24 NOTE — Telephone Encounter (Signed)
Pt aware - routed to pcp  

## 2016-10-30 ENCOUNTER — Telehealth: Payer: Self-pay | Admitting: Cardiology

## 2016-10-30 NOTE — Telephone Encounter (Signed)
Lauren [name] called stating that she is retaining fluid in her ankles and legs. She is wanting to know if she needs to increase her fluid pill.

## 2016-10-30 NOTE — Telephone Encounter (Signed)
Pt says she has gained 5lbs in the last week w/swelling in ankles and feet - wanted to take extra lasix for the next few days - denies SOB or any other symptoms - per LOV dictation - pt will take extra lasix for swelling and watch salt intake and f/u with callback Monday.

## 2016-11-14 ENCOUNTER — Encounter: Payer: Self-pay | Admitting: Cardiology

## 2016-11-14 ENCOUNTER — Ambulatory Visit (INDEPENDENT_AMBULATORY_CARE_PROVIDER_SITE_OTHER): Payer: Medicare HMO | Admitting: Cardiology

## 2016-11-14 VITALS — BP 118/87 | HR 87 | Ht 62.0 in | Wt 267.0 lb

## 2016-11-14 DIAGNOSIS — I890 Lymphedema, not elsewhere classified: Secondary | ICD-10-CM

## 2016-11-14 DIAGNOSIS — I481 Persistent atrial fibrillation: Secondary | ICD-10-CM | POA: Diagnosis not present

## 2016-11-14 DIAGNOSIS — I4819 Other persistent atrial fibrillation: Secondary | ICD-10-CM

## 2016-11-14 DIAGNOSIS — I5032 Chronic diastolic (congestive) heart failure: Secondary | ICD-10-CM | POA: Diagnosis not present

## 2016-11-14 DIAGNOSIS — J90 Pleural effusion, not elsewhere classified: Secondary | ICD-10-CM | POA: Diagnosis not present

## 2016-11-14 MED ORDER — DILTIAZEM HCL ER COATED BEADS 240 MG PO CP24: 240.0000 mg | ORAL_CAPSULE | Freq: Every day | ORAL | 6 refills | Status: DC

## 2016-11-14 NOTE — Progress Notes (Signed)
Clinical Summary Lauren [name] is a 70 y.o.female seen today for follow up of the following medical problems.   1. Afib -  admit to [facility] 08/2016, found to be in afib with RVR. New diagnosis at that time. - has had some occasional palpitations, mainly with getting upset. Fairly infrequent - compliant with meds. She did not increase her lopressor as we discussed last visit.     2. Lymphedema - Weight down 7 lbs since last visit. Compliant with diuretics.    3. HTN - she is compliant with meds  4. Chronic diastolic heart failure - recent admission 08/2016 with acute CHF - diuresed up to 3 liters during admission. - echo 08/2016 showed LVEF 50-55%, mild MR,   - home weights around 262 lbs and trending down  5. Left pleural effusion - moderate by CXR during 08/2016 admission - SOB continues to improve   6. OSA screen - not sure if snores, unsure of apneic episodes. Denies daytime somnolence.  Past Medical History:  Diagnosis Date  . Acute cholecystitis 12/06/2012  . Atrial fibrillation with RVR ([place]) 12/06/2012  . Diverticulosis   . Hypothyroidism   . Obesity   . Pancreatitis, acute 12/04/2012  . Periumbilical hernia      Allergies  Allergen Reactions  . Codeine Itching  . Compazine [Prochlorperazine Edisylate]   . Prednisone Nausea And Vomiting  . Vicodin [Hydrocodone-Acetaminophen] Itching     Current Outpatient Prescriptions  Medication Sig Dispense Refill  . aspirin-acetaminophen-caffeine (EXCEDRIN MIGRAINE) 250-250-65 MG per tablet Take 1 tablet by mouth every 6 (six) hours as needed for headache.    . diltiazem (CARDIZEM CD) 180 MG 24 hr capsule Take 1 capsule (180 mg total) by mouth daily. 90 capsule 3  . furosemide (LASIX) 20 MG tablet Take 2 tablets (40 mg total) by mouth daily. 180 tablet 3  . hydrocortisone cream 1 % Apply topically 2 (two) times daily as needed. Itchy rash.    . levothyroxine (SYNTHROID, LEVOTHROID) 100 MCG tablet  Take 100 mcg by mouth daily.    . metoprolol tartrate (LOPRESSOR) 25 MG tablet Take 1.5 tablets (37.5 mg total) by mouth 2 (two) times daily. 180 tablet 3  . nystatin (MYCOSTATIN) 100000 UNIT/ML suspension Take 5 mLs (500,000 Units total) by mouth 4 (four) times daily. 480 mL 0  . omeprazole (PRILOSEC) 20 MG capsule Take 1 capsule (20 mg total) by mouth daily. For acid reflux. 30 capsule 2  . potassium chloride SA (K-DUR,KLOR-CON) 20 MEQ tablet Take 1 tablet (20 mEq total) by mouth daily. 90 tablet 3  . rivaroxaban (XARELTO) 20 MG TABS tablet Take 1 tablet (20 mg total) by mouth daily with supper. 30 tablet 6   No current facility-administered medications for this visit.      Past Surgical History:  Procedure Laterality Date  . CHOLECYSTECTOMY N/A 03/25/2014   Procedure: LAPAROSCOPIC CHOLECYSTECTOMY WITH INTRAOPERATIVE CHOLANGIOGRAM;  Surgeon: Ralene Ok, MD;  Location: [place];  Service: General;  Laterality: N/A;     Allergies  Allergen Reactions  . Codeine Itching  . Compazine [Prochlorperazine Edisylate]   . Prednisone Nausea And Vomiting  . Vicodin [Hydrocodone-Acetaminophen] Itching      Family History  Problem Relation Age of Onset  . Cancer Father     liver  . Hypertension Brother   . Heart disease Maternal Grandmother   . Kidney disease Maternal Grandfather   . Diabetes Maternal Grandfather   . Kidney failure Brother  Social History Ms. Hevey reports that she has never smoked. She has never used smokeless tobacco. Ms. Welk reports that she does not drink alcohol.   Review of Systems CONSTITUTIONAL: No weight loss, fever, chills, weakness or fatigue.  HEENT: Eyes: No visual loss, blurred vision, double vision or yellow sclerae.No hearing loss, sneezing, congestion, runny nose or sore throat.  SKIN: No rash or itching.  CARDIOVASCULAR: per HPI RESPIRATORY: No shortness of breath, cough or sputum.  GASTROINTESTINAL: No anorexia, nausea, vomiting or  diarrhea. No abdominal pain or blood.  GENITOURINARY: No burning on urination, no polyuria NEUROLOGICAL: No headache, dizziness, syncope, paralysis, ataxia, numbness or tingling in the extremities. No change in bowel or bladder control.  MUSCULOSKELETAL: No muscle, back pain, joint pain or stiffness.  LYMPHATICS: No enlarged nodes. No history of splenectomy.  PSYCHIATRIC: No history of depression or anxiety.  ENDOCRINOLOGIC: No reports of sweating, cold or heat intolerance. No polyuria or polydipsia.  Marland Kitchen   Physical Examination Vitals:   11/14/16 1444  BP: 118/87  Pulse: 87   Vitals:   11/14/16 1444  Weight: 267 lb (121.1 kg)  Height: 5\' 2"  (1.575 m)    Gen: resting comfortably, no acute distress HEENT: no scleral icterus, pupils equal round and reactive, no palptable cervical adenopathy,  CV: irreg, no m/r/g, no jvd Resp: Clear to auscultation bilaterally GI: abdomen is soft, non-tender, non-distended, normal bowel sounds, no hepatosplenomegaly MSK: extremities are warm, 1+ bilateral LE edema Skin: warm, no rash Neuro:  no focal deficits Psych: appropriate affect    Assessment and Plan   1. Afib - no curent symptoms. EKG in clinic shows afib with mildly elevated rates.  -CHADS2Vasc score is 3, she is on xarelto - discussed possible DCCV, she is not in favor at this time. - in crease dilt to 240mg  daily.    2. Lymphedema - LE edema appears to be combined lymphedema and CHF - continue diuresis at this time. She is not intersted in referral to lymphedema clinic at this time  3. Chronic diastolic HF - continue diuretics, weight and edema trending down.   4. Pleural effusion - repeat CXR to look for resoluatoin .  5. OSA screen - negative screening history.   6. HTN - she is at goal, continue current meds       Arnoldo Lenis, M.D.

## 2016-11-14 NOTE — Patient Instructions (Addendum)
Medication Instructions:   Increase Diltiazem CD to 240mg  daily.  Continue all other medications.    Labwork: none  Testing/Procedures:  Chest x-ray - order given today.  Office will contact with results via phone or letter.    Follow-Up: 3 months   Any Other Special Instructions Will Be Listed Below (If Applicable).  If you need a refill on your cardiac medications before your next appointment, please call your pharmacy.

## 2017-01-15 ENCOUNTER — Other Ambulatory Visit: Payer: Self-pay

## 2017-01-15 ENCOUNTER — Telehealth: Payer: Self-pay | Admitting: Cardiology

## 2017-01-15 MED ORDER — LEVOTHYROXINE SODIUM 100 MCG PO TABS: 100.0000 ug | ORAL_TABLET | Freq: Every day | ORAL | 0 refills | Status: DC

## 2017-01-15 NOTE — Telephone Encounter (Signed)
Synthroid sent to pharmacy. Patient notified.

## 2017-01-15 NOTE — Telephone Encounter (Signed)
Can give 1 month Rx for synthroid. Can you help me with this Edd Fabian since Hardie Pulley is out   J BrancH MD

## 2017-01-15 NOTE — Telephone Encounter (Signed)
Lauren [name] called stating that she has switched family PCP. Her appointment with new PCP is in March. She is running out of her Synthroid 100mg  and she wanted to know if Dr. Harl Bowie would fill the RX until she sees her new PCP.

## 2017-01-30 ENCOUNTER — Telehealth: Payer: Self-pay | Admitting: Cardiology

## 2017-01-30 NOTE — Telephone Encounter (Signed)
Patient called wanting to know if she can take Tylenol.

## 2017-01-30 NOTE — Telephone Encounter (Signed)
Yes tylenol is ok   Zandra Abts MD

## 2017-01-30 NOTE — Telephone Encounter (Signed)
Patient informed that she may use tylenol as directed on the bottle

## 2017-02-13 ENCOUNTER — Ambulatory Visit: Payer: Medicare HMO | Admitting: Cardiology

## 2017-03-05 ENCOUNTER — Ambulatory Visit: Payer: Medicare HMO | Admitting: Cardiology

## 2017-03-14 ENCOUNTER — Other Ambulatory Visit: Payer: Self-pay | Admitting: Cardiology

## 2017-03-21 ENCOUNTER — Ambulatory Visit: Payer: Medicare HMO | Admitting: Cardiology

## 2017-04-11 ENCOUNTER — Ambulatory Visit: Payer: Medicare HMO | Admitting: Cardiology

## 2017-04-17 ENCOUNTER — Other Ambulatory Visit: Payer: Self-pay | Admitting: Cardiology

## 2017-05-02 ENCOUNTER — Ambulatory Visit (INDEPENDENT_AMBULATORY_CARE_PROVIDER_SITE_OTHER): Payer: Medicare HMO | Admitting: Cardiology

## 2017-05-02 VITALS — BP 109/71 | HR 73 | Ht 62.0 in | Wt 268.6 lb

## 2017-05-02 DIAGNOSIS — I10 Essential (primary) hypertension: Secondary | ICD-10-CM

## 2017-05-02 DIAGNOSIS — I5032 Chronic diastolic (congestive) heart failure: Secondary | ICD-10-CM | POA: Diagnosis not present

## 2017-05-02 DIAGNOSIS — I4891 Unspecified atrial fibrillation: Secondary | ICD-10-CM

## 2017-05-02 DIAGNOSIS — I890 Lymphedema, not elsewhere classified: Secondary | ICD-10-CM

## 2017-05-02 NOTE — Patient Instructions (Signed)

## 2017-05-02 NOTE — Progress Notes (Signed)
Clinical Summary Lauren [name] is a 71 y.o.female  seen today for follow up of the following medical problems.   1. Afib -  admit to [facility] 08/2016, found to be in afib with RVR. New diagnosis at that time.   - just occasional palpitations. No bleeding on xarelto.   2. Lymphedema - continued swelling. Taking lasix 40mg  daily.  - home weights stable around 266 lbs   3. HTN - she is remains compliant with meds  4. Chronic diastolic heart failure - recent admission 08/2016 with acute CHF - diuresed up to 3 liters during admission. - echo 08/2016 showed LVEF 50-55%, mild MR,   - home weights around 262 lbs and trending down      SH: brother passed away in Mar 23, 2023.  Past Medical History:  Diagnosis Date  . Acute cholecystitis 12/06/2012  . Atrial fibrillation with RVR ([place]) 12/06/2012  . Diverticulosis   . Hypothyroidism   . Obesity   . Pancreatitis, acute 12/04/2012  . Periumbilical hernia      Allergies  Allergen Reactions  . Codeine Itching  . Compazine [Prochlorperazine Edisylate]   . Prednisone Nausea And Vomiting  . Vicodin [Hydrocodone-Acetaminophen] Itching     Current Outpatient Prescriptions  Medication Sig Dispense Refill  . diltiazem (CARDIZEM CD) 240 MG 24 hr capsule TAKE ONE (1) CAPSULE EACH DAY 90 capsule 0  . furosemide (LASIX) 20 MG tablet Take 2 tablets (40 mg total) by mouth daily. 180 tablet 3  . hydrocortisone cream 1 % Apply topically 2 (two) times daily as needed. Itchy rash.    . levothyroxine (SYNTHROID, LEVOTHROID) 100 MCG tablet Take 1 tablet (100 mcg total) by mouth daily. 30 tablet 0  . metoprolol tartrate (LOPRESSOR) 25 MG tablet Take 1.5 tablets (37.5 mg total) by mouth 2 (two) times daily. 180 tablet 3  . nystatin (MYCOSTATIN) 100000 UNIT/ML suspension Take 5 mLs (500,000 Units total) by mouth 4 (four) times daily. 480 mL 0  . omeprazole (PRILOSEC) 20 MG capsule Take 1 capsule (20 mg total) by mouth daily. For acid  reflux. 30 capsule 2  . potassium chloride SA (K-DUR,KLOR-CON) 20 MEQ tablet Take 1 tablet (20 mEq total) by mouth daily. 90 tablet 3  . XARELTO 20 MG TABS tablet TAKE 1 TABLET DAILY WITH SUPPER 30 tablet 6   No current facility-administered medications for this visit.      Past Surgical History:  Procedure Laterality Date  . CHOLECYSTECTOMY N/A 03/25/2014   Procedure: LAPAROSCOPIC CHOLECYSTECTOMY WITH INTRAOPERATIVE CHOLANGIOGRAM;  Surgeon: Ralene Ok, MD;  Location: [place];  Service: General;  Laterality: N/A;     Allergies  Allergen Reactions  . Codeine Itching  . Compazine [Prochlorperazine Edisylate]   . Prednisone Nausea And Vomiting  . Vicodin [Hydrocodone-Acetaminophen] Itching      Family History  Problem Relation Age of Onset  . Cancer Father        liver  . Hypertension Brother   . Heart disease Maternal Grandmother   . Kidney disease Maternal Grandfather   . Diabetes Maternal Grandfather   . Kidney failure Brother      Social History Ms. Spilman reports that she has never smoked. She has never used smokeless tobacco. Ms. Noyes reports that she does not drink alcohol.   Review of Systems CONSTITUTIONAL: No weight loss, fever, chills, weakness or fatigue.  HEENT: Eyes: No visual loss, blurred vision, double vision or yellow sclerae.No hearing loss, sneezing, congestion, runny nose or sore throat.  SKIN: No rash or itching.  CARDIOVASCULAR: per hpi RESPIRATORY: No shortness of breath, cough or sputum.  GASTROINTESTINAL: No anorexia, nausea, vomiting or diarrhea. No abdominal pain or blood.  GENITOURINARY: No burning on urination, no polyuria NEUROLOGICAL: No headache, dizziness, syncope, paralysis, ataxia, numbness or tingling in the extremities. No change in bowel or bladder control.  MUSCULOSKELETAL: No muscle, back pain, joint pain or stiffness.  LYMPHATICS: No enlarged nodes. No history of splenectomy.  PSYCHIATRIC: No history of depression or  anxiety.  ENDOCRINOLOGIC: No reports of sweating, cold or heat intolerance. No polyuria or polydipsia.  Marland Kitchen   Physical Examination Vitals:   05/02/17 1440  BP: 109/71  Pulse: 73   Vitals:   05/02/17 1440  Weight: 268 lb 9.6 oz (121.8 kg)  Height: 5\' 2"  (1.575 m)    Gen: resting comfortably, no acute distress HEENT: no scleral icterus, pupils equal round and reactive, no palptable cervical adenopathy,  CV: RRR, no m/r/g, no jvd Resp: Clear to auscultation bilaterally GI: abdomen is soft, non-tender, non-distended, normal bowel sounds, no hepatosplenomegaly MSK: extremities are warm, chronic lymphedematous swelling Skin: warm, no rash Neuro:  no focal deficits Psych: appropriate affect     Assessment and Plan   1. Afib -CHADS2Vasc score is 3, she is on xarelto - no recent symptoms, continue current meds   2. Lymphedema - LE edema appears to be combined lymphedema and CHF - discussed again possible referral to lymphedema clinic, she wishes to think over at this time  3. Chronic diastolic HF - continue diuretics at current doses    4. HTN - bp at goal, she will continue current meds      Arnoldo Lenis, M.D.

## 2017-05-04 ENCOUNTER — Encounter: Payer: Self-pay | Admitting: Cardiology

## 2017-05-07 ENCOUNTER — Encounter: Payer: Self-pay | Admitting: *Deleted

## 2017-05-30 ENCOUNTER — Other Ambulatory Visit: Payer: Self-pay | Admitting: Cardiology

## 2017-09-09 ENCOUNTER — Other Ambulatory Visit: Payer: Self-pay | Admitting: Cardiology

## 2017-10-13 ENCOUNTER — Other Ambulatory Visit: Payer: Self-pay | Admitting: Cardiology

## 2017-10-13 ENCOUNTER — Telehealth: Payer: Self-pay | Admitting: Cardiology

## 2017-10-13 MED ORDER — METOPROLOL TARTRATE 25 MG PO TABS: 25.0000 mg | ORAL_TABLET | Freq: Two times a day (BID) | ORAL | 1 refills | Status: DC

## 2017-10-13 NOTE — Telephone Encounter (Signed)
Done

## 2017-10-13 NOTE — Telephone Encounter (Signed)
°*  STAT* If patient is at the pharmacy, call can be transferred to refill team.   1. Which medications need to be refilled? metoprolol tartrate (LOPRESSOR) 25 MG tablet    2. Which pharmacy/location (including street and city if local pharmacy) is medication to be sent to? [facility]  3. Do they need a 30 day or 90 day supply?

## 2017-10-30 ENCOUNTER — Ambulatory Visit: Payer: Medicare HMO | Admitting: Cardiology

## 2018-01-01 ENCOUNTER — Other Ambulatory Visit: Payer: Self-pay

## 2018-01-01 ENCOUNTER — Encounter: Payer: Self-pay | Admitting: Cardiology

## 2018-01-01 ENCOUNTER — Ambulatory Visit: Payer: Medicare HMO | Admitting: Cardiology

## 2018-01-01 ENCOUNTER — Encounter: Payer: Self-pay | Admitting: *Deleted

## 2018-01-01 VITALS — BP 118/74 | HR 70 | Ht 62.0 in | Wt 288.0 lb

## 2018-01-01 DIAGNOSIS — I890 Lymphedema, not elsewhere classified: Secondary | ICD-10-CM | POA: Diagnosis not present

## 2018-01-01 DIAGNOSIS — I4891 Unspecified atrial fibrillation: Secondary | ICD-10-CM

## 2018-01-01 DIAGNOSIS — I10 Essential (primary) hypertension: Secondary | ICD-10-CM

## 2018-01-01 DIAGNOSIS — I5033 Acute on chronic diastolic (congestive) heart failure: Secondary | ICD-10-CM

## 2018-01-01 MED ORDER — FUROSEMIDE 40 MG PO TABS: 40.0000 mg | ORAL_TABLET | Freq: Two times a day (BID) | ORAL | 3 refills | Status: DC

## 2018-01-01 NOTE — Patient Instructions (Signed)
Your physician recommends that you schedule a follow-up appointment in: [place] has recommended you make the following change in your medication:   INCREASE LASIX 40 MG TWICE DAILY  Your physician recommends that you return for lab work TSH/MAG/BMP  Thank you for choosing [facility]!!

## 2018-01-01 NOTE — Progress Notes (Signed)
Clinical Summary Ms. Smeal is a 72 y.o.female seen today for follow up of the following medical problems.   1. Afib - admit to [facility] 08/2016, found to be in afib with RVR. New diagnosis at that time.   - recent vaginal bleeding. Had to stop xarelto for a period. She has restarted but only taking every other day.    2. Lymphedema - continued swelling. Taking lasix 40mg  daily.  - home weights stable around 266 lbs  - increased leg swelling, weight up 20 lbs since 04/2017. Some increased DOE - takes lasix 40mg  daily. Not limiting sodium intake.   3. HTN - compliant with meds  4. Chronic diastolic heart failure - recent admission 08/2016 with acute CHF - diuresed up to 3 liters during admission. - echo 08/2016 showed LVEF 50-55%, mild MR,   - recent increased weight and edema.       SH: brother passed away in March 21, 2023.    Past Medical History:  Diagnosis Date  . Acute cholecystitis 12/06/2012  . Atrial fibrillation with RVR ([place]) 12/06/2012  . Diverticulosis   . Hypothyroidism   . Obesity   . Pancreatitis, acute 12/04/2012  . Periumbilical hernia      Allergies  Allergen Reactions  . Codeine Itching  . Compazine [Prochlorperazine Edisylate]   . Prednisone Nausea And Vomiting  . Vicodin [Hydrocodone-Acetaminophen] Itching     Current Outpatient Medications  Medication Sig Dispense Refill  . diltiazem (CARDIZEM CD) 240 MG 24 hr capsule TAKE ONE (1) CAPSULE EACH DAY 90 capsule 3  . furosemide (LASIX) 40 MG tablet Take 40 mg by mouth daily.    Marland Kitchen levothyroxine (SYNTHROID, LEVOTHROID) 100 MCG tablet Take 1 tablet (100 mcg total) by mouth daily. 30 tablet 0  . metoprolol tartrate (LOPRESSOR) 25 MG tablet Take 1 tablet (25 mg total) 2 (two) times daily by mouth. 60 tablet 1  . potassium chloride SA (K-DUR,KLOR-CON) 20 MEQ tablet TAKE ONE (1) TABLET EACH DAY 90 tablet 3  . XARELTO 20 MG TABS tablet TAKE 1 TABLET DAILY WITH SUPPER 30 tablet 6    No current facility-administered medications for this visit.      Past Surgical History:  Procedure Laterality Date  . CHOLECYSTECTOMY N/A 03/25/2014   Procedure: LAPAROSCOPIC CHOLECYSTECTOMY WITH INTRAOPERATIVE CHOLANGIOGRAM;  Surgeon: Ralene Ok, MD;  Location: [place];  Service: General;  Laterality: N/A;     Allergies  Allergen Reactions  . Codeine Itching  . Compazine [Prochlorperazine Edisylate]   . Prednisone Nausea And Vomiting  . Vicodin [Hydrocodone-Acetaminophen] Itching      Family History  Problem Relation Age of Onset  . Cancer Father        liver  . Hypertension Brother   . Heart disease Maternal Grandmother   . Kidney disease Maternal Grandfather   . Diabetes Maternal Grandfather   . Kidney failure Brother      Social History Ms. Rennaker reports that  has never smoked. she has never used smokeless tobacco. Ms. Vonderhaar reports that she does not drink alcohol.   Review of Systems CONSTITUTIONAL: No weight loss, fever, chills, weakness or fatigue.  HEENT: Eyes: No visual loss, blurred vision, double vision or yellow sclerae.No hearing loss, sneezing, congestion, runny nose or sore throat.  SKIN: No rash or itching.  CARDIOVASCULAR: per hpi RESPIRATORY: per hpi GASTROINTESTINAL: No anorexia, nausea, vomiting or diarrhea. No abdominal pain or blood.  GENITOURINARY: No burning on urination, no polyuria NEUROLOGICAL: No headache, dizziness, syncope, paralysis,  ataxia, numbness or tingling in the extremities. No change in bowel or bladder control.  MUSCULOSKELETAL: No muscle, back pain, joint pain or stiffness.  LYMPHATICS: No enlarged nodes. No history of splenectomy.  PSYCHIATRIC: No history of depression or anxiety.  ENDOCRINOLOGIC: No reports of sweating, cold or heat intolerance. No polyuria or polydipsia.  Marland Kitchen   Physical Examination Vitals:   01/01/18 1306  BP: 118/74  Pulse: 70  SpO2: 95%   Vitals:   01/01/18 1306  Weight: 288 lb (130.6 kg)   Height: 5\' 2"  (1.575 m)    Gen: resting comfortably, no acute distress HEENT: no scleral icterus, pupils equal round and reactive, no palptable cervical adenopathy,  CV: RRR, no m/r/,g no jvd Resp: Clear to auscultation bilaterally GI: abdomen is soft, non-tender, non-distended, normal bowel sounds, no hepatosplenomegaly MSK: extremities are warm, 1+ bilateral edema Skin: warm, no rash Neuro:  no focal deficits Psych: appropriate affect     Assessment and Plan   1. Afib -CHADS2Vasc score is 3, she is on xarelto. Counseled to take xarelto daily - conitnue current meds   2. Lymphedema - she has not wished to go to lypmhedema clinic - continue diuretics  3. Acute on chronic diastolic HF - recent increase in weight and edema.   - increase lasix to 40mg  bid. Check BMET/Mg/TSH in 2 weeks.   4. HTN -at goal, continue current meds     Arnoldo Lenis, M.D.

## 2018-01-04 ENCOUNTER — Encounter: Payer: Self-pay | Admitting: Cardiology

## 2018-02-17 DIAGNOSIS — I890 Lymphedema, not elsewhere classified: Secondary | ICD-10-CM

## 2018-02-17 DIAGNOSIS — I10 Essential (primary) hypertension: Secondary | ICD-10-CM | POA: Insufficient documentation

## 2018-02-24 ENCOUNTER — Ambulatory Visit: Payer: Medicare HMO | Admitting: Cardiology

## 2018-03-04 ENCOUNTER — Other Ambulatory Visit: Payer: Self-pay | Admitting: Cardiology

## 2018-03-24 ENCOUNTER — Ambulatory Visit: Payer: Medicare HMO | Admitting: Cardiology

## 2018-03-24 ENCOUNTER — Encounter: Payer: Self-pay | Admitting: Cardiology

## 2018-03-24 VITALS — BP 114/74 | HR 81 | Ht 62.0 in | Wt 288.0 lb

## 2018-03-24 DIAGNOSIS — I5033 Acute on chronic diastolic (congestive) heart failure: Secondary | ICD-10-CM | POA: Diagnosis not present

## 2018-03-24 DIAGNOSIS — I890 Lymphedema, not elsewhere classified: Secondary | ICD-10-CM

## 2018-03-24 DIAGNOSIS — I4891 Unspecified atrial fibrillation: Secondary | ICD-10-CM

## 2018-03-24 MED ORDER — TORSEMIDE 20 MG PO TABS: 40.0000 mg | ORAL_TABLET | Freq: Every day | ORAL | 1 refills | Status: DC

## 2018-03-24 NOTE — Progress Notes (Signed)
Clinical Summary Lauren [name] is a 72 y.o.female seen today for follow up of the following medical problems. This is a follow up for recent issues with leg swelling and weight gain.    1. Acute on chronic diastolic heart failure - recent admission 08/2016 with acute CHF - diuresed up to 3 liters during admission. - echo 08/2016 showed LVEF 50-55%, mild MR,   - recent increased weight and edema.   - last visit we increased lasix to 40mg  bid. Labs remain stable - weight and swelling overall unchanged since last visit.      SH: brother passed away in 03/14/2023.    Past Medical History:  Diagnosis Date  . Acute cholecystitis 12/06/2012  . Atrial fibrillation with RVR ([place]) 12/06/2012  . Diverticulosis   . Hypothyroidism   . Obesity   . Pancreatitis, acute 12/04/2012  . Periumbilical hernia      Allergies  Allergen Reactions  . Codeine Itching  . Compazine [Prochlorperazine Edisylate]   . Prednisone Nausea And Vomiting  . Vicodin [Hydrocodone-Acetaminophen] Itching     Current Outpatient Medications  Medication Sig Dispense Refill  . diltiazem (CARDIZEM CD) 240 MG 24 hr capsule TAKE ONE (1) CAPSULE EACH DAY 90 capsule 0  . furosemide (LASIX) 40 MG tablet Take 1 tablet (40 mg total) by mouth 2 (two) times daily. 60 tablet 3  . levothyroxine (SYNTHROID, LEVOTHROID) 100 MCG tablet Take 1 tablet (100 mcg total) by mouth daily. 30 tablet 0  . metoprolol tartrate (LOPRESSOR) 25 MG tablet Take 1 tablet (25 mg total) 2 (two) times daily by mouth. 60 tablet 1  . potassium chloride SA (K-DUR,KLOR-CON) 20 MEQ tablet TAKE ONE (1) TABLET EACH DAY 90 tablet 3  . XARELTO 20 MG TABS tablet TAKE 1 TABLET DAILY WITH SUPPER 90 tablet 0   No current facility-administered medications for this visit.      Past Surgical History:  Procedure Laterality Date  . CHOLECYSTECTOMY N/A 03/25/2014   Procedure: LAPAROSCOPIC CHOLECYSTECTOMY WITH INTRAOPERATIVE CHOLANGIOGRAM;  Surgeon: Ralene Ok, MD;  Location: [place];  Service: General;  Laterality: N/A;     Allergies  Allergen Reactions  . Codeine Itching  . Compazine [Prochlorperazine Edisylate]   . Prednisone Nausea And Vomiting  . Vicodin [Hydrocodone-Acetaminophen] Itching      Family History  Problem Relation Age of Onset  . Cancer Father        liver  . Hypertension Brother   . Heart disease Maternal Grandmother   . Kidney disease Maternal Grandfather   . Diabetes Maternal Grandfather   . Kidney failure Brother      Social History Ms. Franqui reports that she has never smoked. She has never used smokeless tobacco. Ms. Pehrson reports that she does not drink alcohol.   Review of Systems CONSTITUTIONAL: No weight loss, fever, chills, weakness or fatigue.  HEENT: Eyes: No visual loss, blurred vision, double vision or yellow sclerae.No hearing loss, sneezing, congestion, runny nose or sore throat.  SKIN: No rash or itching.  CARDIOVASCULAR: no chest pain, no palpitations.  RESPIRATORY: No shortness of breath, cough or sputum.  GASTROINTESTINAL: No anorexia, nausea, vomiting or diarrhea. No abdominal pain or blood.  GENITOURINARY: No burning on urination, no polyuria NEUROLOGICAL: No headache, dizziness, syncope, paralysis, ataxia, numbness or tingling in the extremities. No change in bowel or bladder control.  MUSCULOSKELETAL: No muscle, back pain, joint pain or stiffness.  LYMPHATICS: No enlarged nodes. No history of splenectomy.  PSYCHIATRIC: No history of  depression or anxiety.  ENDOCRINOLOGIC: No reports of sweating, cold or heat intolerance. No polyuria or polydipsia.  Marland Kitchen   Physical Examination Vitals:   03/24/18 1522  BP: 114/74  Pulse: 81  SpO2: 97%   Vitals:   03/24/18 1522  Weight: 288 lb (130.6 kg)  Height: 5\' 2"  (1.575 m)    Gen: resting comfortably, no acute distress HEENT: no scleral icterus, pupils equal round and reactive, no palptable cervical adenopathy,  CV: RRR, no m/r/g,  no jvd Resp: Clear to auscultation bilaterally GI: abdomen is soft, non-tender, non-distended, normal bowel sounds, no hepatosplenomegaly MSK: extremities are warm, 2+ bilateral LE edema  Skin: warm, no rash Neuro:  no focal deficits Psych: appropriate affect     Assessment and Plan    1. Acute on chronic diastolic HF - recent increase in weight and edema.  - we will d/c lasix, start torsemide 40mg  daily. Check BMET/Mg in 2 weeks  2. Lymphedema - her swelling is due both to diastolic HF and lymphedema - we will refer to lymphedema clinic.   3. Afib - rates controlled, no symptoms, continue current meds    F/u 2 months  Arnoldo Lenis, M.D.

## 2018-03-24 NOTE — Patient Instructions (Signed)
Your physician recommends that you schedule a follow-up appointment in: [place] has recommended you make the following change in your medication:   STOP LASIX  START TORSEMIDE 40 MG DAILY (2 TABLETS)  Your physician recommends that you return for lab work in: [place] BMP/MG - [place].  You have been referred to [facility]   Thank you for choosing [facility]!!

## 2018-03-25 ENCOUNTER — Other Ambulatory Visit: Payer: Self-pay | Admitting: Cardiology

## 2018-03-25 ENCOUNTER — Other Ambulatory Visit: Payer: Self-pay | Admitting: *Deleted

## 2018-03-25 MED ORDER — TORSEMIDE 20 MG PO TABS: 40.0000 mg | ORAL_TABLET | Freq: Every day | ORAL | 1 refills | Status: DC

## 2018-03-25 NOTE — Telephone Encounter (Signed)
torsemide (DEMADEX) 20 MG tablet   Needs to be sent in to [facility] in [place]

## 2018-03-25 NOTE — Telephone Encounter (Signed)
Medication sent to pharmacy  

## 2018-03-29 ENCOUNTER — Encounter: Payer: Self-pay | Admitting: Cardiology

## 2018-04-30 ENCOUNTER — Other Ambulatory Visit: Payer: Self-pay | Admitting: *Deleted

## 2018-04-30 DIAGNOSIS — M7989 Other specified soft tissue disorders: Secondary | ICD-10-CM

## 2018-05-15 ENCOUNTER — Other Ambulatory Visit: Payer: Self-pay | Admitting: Cardiology

## 2018-05-25 ENCOUNTER — Ambulatory Visit: Payer: Medicare HMO | Admitting: Cardiology

## 2018-06-01 ENCOUNTER — Ambulatory Visit (HOSPITAL_COMMUNITY): Payer: Medicare HMO | Admitting: Physical Therapy

## 2018-06-01 ENCOUNTER — Telehealth (HOSPITAL_COMMUNITY): Payer: Self-pay | Admitting: Family Medicine

## 2018-06-01 NOTE — Telephone Encounter (Signed)
06/01/18  she left a message to [facility] said she got to looking at copays and don't know how to handle because she can't pay everytime she comes

## 2018-06-10 ENCOUNTER — Encounter (HOSPITAL_COMMUNITY): Payer: Self-pay | Admitting: Physical Therapy

## 2018-06-10 ENCOUNTER — Ambulatory Visit (HOSPITAL_COMMUNITY): Payer: Medicare HMO | Attending: Cardiology | Admitting: Physical Therapy

## 2018-06-10 ENCOUNTER — Other Ambulatory Visit: Payer: Self-pay

## 2018-06-10 DIAGNOSIS — I890 Lymphedema, not elsewhere classified: Secondary | ICD-10-CM | POA: Diagnosis not present

## 2018-06-10 DIAGNOSIS — M6281 Muscle weakness (generalized): Secondary | ICD-10-CM | POA: Insufficient documentation

## 2018-06-10 DIAGNOSIS — R262 Difficulty in walking, not elsewhere classified: Secondary | ICD-10-CM

## 2018-06-10 NOTE — Therapy (Addendum)
Lauren [name], [place], [address] Phone: [phone]   Fax:  [phone]  Physical Therapy Evaluation  Patient Details  Name: Lauren [name] MRN: [id number] Date of Birth: July 12, 1946 Referring Provider: Carlyle [name]   Encounter Date: 06/10/2018  PT End of Session - 06/10/18 1607    Visit Number  1    Number of Visits  24   Date for PT Re-Evaluation  07/10/18    Authorization Type  [employer]     PT Start Time  1300    PT Stop Time  1425    PT Time Calculation (min)  85 min    Activity Tolerance  Patient tolerated treatment well    Behavior During Therapy  [facility] for tasks assessed/performed       Past Medical History:  Diagnosis Date  . Acute cholecystitis 12/06/2012  . Atrial fibrillation with RVR ([place]) 12/06/2012  . Diverticulosis   . Hypothyroidism   . Obesity   . Pancreatitis, acute 12/04/2012  . Periumbilical hernia     Past Surgical History:  Procedure Laterality Date  . CHOLECYSTECTOMY N/A 03/25/2014   Procedure: LAPAROSCOPIC CHOLECYSTECTOMY WITH INTRAOPERATIVE CHOLANGIOGRAM;  Surgeon: Lauren Ok, MD;  Location: [place];  Service: General;  Laterality: N/A;    There were no vitals filed for this visit.   Subjective Assessment - 06/10/18 1425    Subjective  Lauren [name] states that for the last year or two her legs have been progressively increasing in fluid.   She is finding it difficult to walk.     Pertinent History  CHF, AFib     Limitations  Standing;Walking;House hold activities    How long can you sit comfortably?  no problem     How long can you stand comfortably?  5 mintues     How long can you walk comfortably?  not sure     Currently in Pain?  No/denies         [facility] PT Assessment - 06/10/18 0001      Assessment   Medical Diagnosis  B lymphedema    Referring Provider  Lauren [name]    Onset Date/Surgical Date  12/06/16    Next MD Visit  07/06/2018    Prior Therapy  none      Precautions   Precautions  Other (comment)    Precaution Comments  cellulitis       Restrictions   Weight Bearing Restrictions  No      Balance Screen   Has the patient fallen in the past 6 months  No    Has the patient had a decrease in activity level because of a fear of falling?   Yes    Is the patient reluctant to leave their home because of a fear of falling?   No      Prior Function   Level of Independence  Independent with basic ADLs    Vocation  Retired    Leisure  Neurosurgeon   Overall Cognitive Status  Within Functional Limits for tasks assessed      Observation/Other Assessments   Other Surveys   Other Surveys Life impact 58/90        LYMPHEDEMA/ONCOLOGY QUESTIONNAIRE - 06/10/18 1316      What other symptoms do you have   Are you Having Heaviness or Tightness  Yes    Are you having Pain  No    Are you  having pitting edema  Yes    Is it Hard or Difficult finding clothes that fit  Yes    Do you have infections  -- not now has in the past     Stemmer Sign  No      Lymphedema Stage   Stage  STAGE 3 ELEPHANTIASIS      Lymphedema Assessments   Lymphedema Assessments  Lower extremities      Right Lower Extremity Lymphedema   20 cm Proximal to Suprapatella  74 cm    10 cm Proximal to Suprapatella  70.9 cm    At Midpatella/Popliteal Crease  65 cm    30 cm Proximal to Floor at Lateral Plantar Foot  62 cm    20 cm Proximal to Floor at Lateral Plantar Foot  51 1    10 cm Proximal to Floor at Lateral Malleoli  31.5 cm    Circumference of ankle/heel  35.5 cm.    5 cm Proximal to 1st MTP Joint  24 cm    Across MTP Joint  23.5 cm    Other  noted abnormal folds       Left Lower Extremity Lymphedema   20 cm Proximal to Suprapatella  78 cm    10 cm Proximal to Suprapatella  82 cm    At Midpatella/Popliteal Crease  70 cm    30 cm Proximal to Floor at Lateral Plantar Foot  71.5 cm    20 cm Proximal to Floor at Lateral Plantar Foot  59 cm    10 cm Proximal to  Floor at Lateral Malleoli  62 cm    Circumference of ankle/heel  34.5 cm.    5 cm Proximal to 1st MTP Joint  23.5 cm    Across MTP Joint  23.5 cm    Other  noted abnormal folds     Other  noted papillomas              Objective measurements completed on examination: See above findings.      [facility] Adult PT Treatment/Exercise - 06/10/18 0001      Exercises   Exercises  Knee/Hip      Knee/Hip Exercises: Seated   Other Seated Knee/Hip Exercises  ankle pump, LAQ, hip ab/adduction; IR/ER, marching and diaphragmic breathing       Manual Therapy   Manual Therapy  Compression Bandaging    Compression Bandaging  1/2 " foam cut for B LE;              PT Education - 06/10/18 1433    Education Details  Lymphedema what it is and how it is controlled ; LE exercises to increase lymphatic circulation     Person(s) Educated  Patient    Methods  Explanation;Demonstration;Handout    Comprehension  Verbalized understanding;Returned demonstration       PT Short Term Goals - 06/10/18 1616      PT SHORT TERM GOAL #1   Title  Pt volume to decrease by 4 cm to decrease risk of cellulitis     Time  4    Period  Weeks    Status  New    Target Date  07/08/18      PT SHORT TERM GOAL #2   Title  PT to be able to lift her left leg into the car without using her hands     Time  4    Period  Weeks    Status  New  PT Long Term Goals - 06/10/18 1617      PT LONG TERM GOAL #1   Title  PT to have lost at least 8 cm of fluid from her left LE to allow pt to be able to ambulate for 15 mintues without resting.     Time  8    Period  Weeks    Status  New    Target Date  08/05/18      PT LONG TERM GOAL #2   Title  Pt to have aquired and have used a compression pump.    Time  8    Period  Weeks    Status  New      PT LONG TERM GOAL #3   Title  Pt to have acquired and be able to don and doff compression garment for maintenance stage.     Time  8    Period  Weeks    Status   New      PT LONG TERM GOAL #4   Title   Pt to be able to stand for 20 minutes in order to allow pt to be able to cook.    Time  8    Period  Weeks    Status  New             Plan - 06/10/18 1608    Clinical Impression Statement  Lauren [name] is a 72 yo female who has been referred to this clinic for B LE lymphedema.  At this time the volume of her legs affects her ability to walk, stand, lift her leg, wear clothing and occasionally causes her pain.   She will benefit from skilled therapy to decrease the volume in her LE B to address these issues.   Her foam was cut today.    History and Personal Factors relevant to plan of care:  CHF,      Clinical Presentation  Evolving    Clinical Decision Making  Moderate    Rehab Potential  Fair    PT Frequency  3x / week    PT Duration  8 weeks    PT Treatment/Interventions  Therapeutic exercise;Manual lymph drainage;Compression bandaging    PT Next Visit Plan  Begin total decongestive techniques using 1/2" foam and short stretch bandaging.  Pt will ultimately need compression bandaging to the thigh but due to CHF we will begin with LE only and  monitor how her heart handles the extra fluid.     PT Home Exercise Plan  ankle pumps, LAQ, hip ab/adduction, hip IR/ER, marching and diaphragmic breathing.     Consulted and Agree with Plan of Care  Patient       Patient will benefit from skilled therapeutic intervention in order to improve the following deficits and impairments:  Abnormal gait, Decreased activity tolerance, Decreased balance, Decreased endurance, Decreased strength, Increased edema, Impaired flexibility, Difficulty walking, Obesity, Pain  Visit Diagnosis: Lymphedema, not elsewhere classified - Plan: PT plan of care cert/re-cert  Difficulty in walking, not elsewhere classified - Plan: PT plan of care cert/re-cert  Muscle weakness (generalized) - Plan: PT plan of care cert/re-cert     Problem List Patient Active Problem List    Diagnosis Date Noted  . Anxiety state, unspecified 03/24/2014  . Gallstone pancreatitis 03/23/2014  . Acute cholecystitis 12/06/2012  . Atrial fibrillation with RVR ([place]) 12/06/2012  . Pancreatitis, acute 12/04/2012  . Hypothyroidism 12/04/2012  . Obesity (BMI 35.0-39.9 without comorbidity) 12/04/2012  Rayetta Humphrey, PT CLT [phone] 06/10/2018, 4:36 PM  [facility] [address] [place], [place], [address] Phone: [phone]   Fax:  [phone]  Name: Lauren [name] MRN: [id number] Date of Birth: 1946-05-26

## 2018-06-11 ENCOUNTER — Encounter (HOSPITAL_COMMUNITY): Payer: Medicare HMO | Admitting: Physical Therapy

## 2018-06-15 ENCOUNTER — Telehealth (HOSPITAL_COMMUNITY): Payer: Self-pay | Admitting: Physical Therapy

## 2018-06-15 NOTE — Telephone Encounter (Signed)
Pt called concerned about copays - gave pt accounting number to her to set up payments. NF 7/22/193

## 2018-06-16 ENCOUNTER — Ambulatory Visit (HOSPITAL_COMMUNITY): Payer: Medicare HMO | Admitting: Physical Therapy

## 2018-06-16 ENCOUNTER — Telehealth (HOSPITAL_COMMUNITY): Payer: Self-pay | Admitting: Family Medicine

## 2018-06-16 NOTE — Telephone Encounter (Signed)
06/16/18  pt called and said it was just raining to hard for her to come here -- she wanted to just wait and start next week because she was told that if she did days back to back it worked best so she wants to start next week

## 2018-06-17 ENCOUNTER — Encounter (HOSPITAL_COMMUNITY): Payer: Medicare HMO | Admitting: Physical Therapy

## 2018-06-18 ENCOUNTER — Other Ambulatory Visit: Payer: Self-pay | Admitting: Cardiology

## 2018-06-19 ENCOUNTER — Encounter

## 2018-06-23 ENCOUNTER — Encounter (HOSPITAL_COMMUNITY): Payer: Self-pay | Admitting: Physical Therapy

## 2018-06-23 ENCOUNTER — Other Ambulatory Visit: Payer: Self-pay

## 2018-06-23 ENCOUNTER — Ambulatory Visit (HOSPITAL_COMMUNITY): Payer: Medicare HMO | Admitting: Physical Therapy

## 2018-06-23 DIAGNOSIS — I890 Lymphedema, not elsewhere classified: Secondary | ICD-10-CM

## 2018-06-23 DIAGNOSIS — R262 Difficulty in walking, not elsewhere classified: Secondary | ICD-10-CM

## 2018-06-23 DIAGNOSIS — M6281 Muscle weakness (generalized): Secondary | ICD-10-CM

## 2018-06-23 NOTE — Therapy (Signed)
[place], [place], [address] Phone: [phone]   Fax:  [phone]  Physical Therapy Treatment  Patient Details  Name: Lauren [name] MRN: [id number] Date of Birth: 1946-03-31 Referring Provider: Carlyle Dolly   Encounter Date: 06/23/2018  PT End of Session - 06/23/18 1625    Visit Number  2    Number of Visits  24    Date for PT Re-Evaluation  07/10/18    Authorization Type  [employer]     Authorization - Visit Number  2    Authorization - Number of Visits  24    PT Start Time  1505    PT Stop Time  1430    PT Time Calculation (min)  83 min    Activity Tolerance  Patient tolerated treatment well    Behavior During Therapy  [facility] for tasks assessed/performed       Past Medical History:  Diagnosis Date  . Acute cholecystitis 12/06/2012  . Atrial fibrillation with RVR ([place]) 12/06/2012  . Diverticulosis   . Hypothyroidism   . Obesity   . Pancreatitis, acute 12/04/2012  . Periumbilical hernia     Past Surgical History:  Procedure Laterality Date  . CHOLECYSTECTOMY N/A 03/25/2014   Procedure: LAPAROSCOPIC CHOLECYSTECTOMY WITH INTRAOPERATIVE CHOLANGIOGRAM;  Surgeon: Ralene Ok, MD;  Location: [place];  Service: General;  Laterality: N/A;    There were no vitals filed for this visit.  Subjective Assessment - 06/23/18 1621    Subjective  Pt states that she has been completing the exercises that was given to her at the evaluation     Pertinent History  CHF, AFib     Limitations  Standing;Walking;House hold activities    How long can you sit comfortably?  no problem     How long can you stand comfortably?  5 mintues     How long can you walk comfortably?  not sure     Currently in Pain?  No/denies                       [facility] Adult PT Treatment/Exercise - 06/23/18 0001      Manual Therapy   Manual Therapy  Manual Lymphatic Drainage (MLD);Compression Bandaging    Manual therapy  comments  done seperate from all other aspects of treatment     Manual Lymphatic Drainage (MLD)  supraclavicular, deep and superfical abdominal followed by routing of fluid from inguinal to axillary anastomosis and LE anteriorly B     Compression Bandaging  using 1/2 " foam and multilayer compression bandaging.              PT Education - 06/23/18 1624    Education Details  bandages will loosen the more she moves around.  Take bandages off if they are causing pain, expect increased urination     Person(s) Educated  Patient    Methods  Explanation    Comprehension  Verbalized understanding       PT Short Term Goals - 06/23/18 1632      PT SHORT TERM GOAL #1   Title  Pt volume to decrease by 4 cm to decrease risk of cellulitis     Time  4    Period  Weeks    Status  On-going      PT SHORT TERM GOAL #2   Title  PT to be able to lift her left leg into the car without using  her hands     Time  4    Period  Weeks    Status  On-going        PT Long Term Goals - 06/23/18 1632      PT LONG TERM GOAL #1   Title  PT to have lost at least 8 cm of fluid from her left LE to allow pt to be able to ambulate for 15 mintues without resting.     Time  8    Period  Weeks    Status  On-going      PT LONG TERM GOAL #2   Title  Pt to have aquired and have used a compression pump.    Time  8    Period  Weeks    Status  On-going      PT LONG TERM GOAL #3   Title  Pt to have acquired and be able to don and doff compression garment for maintenance stage.     Time  8    Period  Weeks    Status  On-going      PT LONG TERM GOAL #4   Title   Pt to be able to stand for 20 minutes in order to allow pt to cook    Time  8    Period  Weeks    Status  On-going            Plan - 06/23/18 1627    Clinical Impression Statement  Therapist noted an open sore along anterior medial aspect of left leg measuring 1.2 x 2.5 cm; multiple small open areas along medial distal aspect of Lt LE .   Areas covered with xeroform prior to bandaging .  Pt states these areas have been itching.  Therapist emphasized not to itch and if she had to rub with her palm but not to use her nails.  Therapist  began total decongestive techniques today with difficulty bandaging left LE due to multiple folds.  Ultimately therapist took one layer off left side for pt comfort.  This may be added back into bandaging next visit.      Rehab Potential  Fair    PT Frequency  3x / week    PT Duration  8 weeks    PT Treatment/Interventions  Therapeutic exercise;Manual lymph drainage;Compression bandaging    PT Next Visit Plan  See how patient tolerated bandaging.  Add last short stretch bandage if pt will allow.  Assess wounds.     PT Home Exercise Plan  ankle pumps, LAQ, hip ab/adduction, hip IR/ER, marching and diaphragmic breathing.     Consulted and Agree with Plan of Care  Patient       Patient will benefit from skilled therapeutic intervention in order to improve the following deficits and impairments:  Abnormal gait, Decreased activity tolerance, Decreased balance, Decreased endurance, Decreased strength, Increased edema, Impaired flexibility, Difficulty walking, Obesity, Pain  Visit Diagnosis: Lymphedema, not elsewhere classified  Difficulty in walking, not elsewhere classified  Muscle weakness (generalized)     Problem List Patient Active Problem List   Diagnosis Date Noted  . Anxiety state, unspecified 03/24/2014  . Gallstone pancreatitis 03/23/2014  . Acute cholecystitis 12/06/2012  . Atrial fibrillation with RVR ([place]) 12/06/2012  . Pancreatitis, acute 12/04/2012  . Hypothyroidism 12/04/2012  . Obesity (BMI 35.0-39.9 without comorbidity) 12/04/2012   Rayetta Humphrey, PT CLT [phone] 06/23/2018, 4:34 PM  [facility], [place],  [place] Phone: [phone]   Fax:  [phone]  Name: Lauren [name] MRN: [id number] Date  of Birth: 12/25/1945

## 2018-06-24 ENCOUNTER — Ambulatory Visit (HOSPITAL_COMMUNITY): Payer: Medicare HMO | Admitting: Physical Therapy

## 2018-06-24 ENCOUNTER — Telehealth: Payer: Self-pay | Admitting: Cardiology

## 2018-06-24 DIAGNOSIS — I890 Lymphedema, not elsewhere classified: Secondary | ICD-10-CM

## 2018-06-24 DIAGNOSIS — M6281 Muscle weakness (generalized): Secondary | ICD-10-CM

## 2018-06-24 DIAGNOSIS — R262 Difficulty in walking, not elsewhere classified: Secondary | ICD-10-CM

## 2018-06-24 NOTE — Telephone Encounter (Signed)
Pt has therapy (lymphedema) 3 days weekly and cannot take torsemide on those days because she can't make it to the bathroom in time with her being bandaged it takes longer to get to the bathroom - so currently pt is taking 40 mg of torsemide 4 days weekly and wanted to make Dr Harl Bowie aware of this and if ok

## 2018-06-24 NOTE — Telephone Encounter (Signed)
Im ok with that. How are her weights doing?   Zandra Abts MD

## 2018-06-24 NOTE — Therapy (Signed)
[place], [place], [address] Phone: [phone]   Fax:  [phone]  Physical Therapy Treatment  Patient Details  Name: Lauren [name] MRN: [id number] Date of Birth: August 10, 1946 Referring Provider: Carlyle Dolly   Encounter Date: 06/24/2018  PT End of Session - 06/24/18 1804    Visit Number  3    Number of Visits  24    Date for PT Re-Evaluation  07/10/18    Authorization Type  [employer]     Authorization - Visit Number  3    Authorization - Number of Visits  24    PT Start Time  5732    PT Stop Time  1735    PT Time Calculation (min)  90 min    Activity Tolerance  Patient tolerated treatment well    Behavior During Therapy  [facility] for tasks assessed/performed       Past Medical History:  Diagnosis Date  . Acute cholecystitis 12/06/2012  . Atrial fibrillation with RVR ([place]) 12/06/2012  . Diverticulosis   . Hypothyroidism   . Obesity   . Pancreatitis, acute 12/04/2012  . Periumbilical hernia     Past Surgical History:  Procedure Laterality Date  . CHOLECYSTECTOMY N/A 03/25/2014   Procedure: LAPAROSCOPIC CHOLECYSTECTOMY WITH INTRAOPERATIVE CHOLANGIOGRAM;  Surgeon: Ralene Ok, MD;  Location: [place];  Service: General;  Laterality: N/A;    There were no vitals filed for this visit.  Subjective Assessment - 06/24/18 1803    Subjective  Pt states her bandages came off in the bed last night.  STates it was so hard to walk with them on and she is just exhausted from the weight of the LE's wearing her out.     Currently in Pain?  No/denies                       [facility] Adult PT Treatment/Exercise - 06/24/18 0001      Manual Therapy   Manual Therapy  Manual Lymphatic Drainage (MLD);Compression Bandaging    Manual therapy comments  done seperate from all other aspects of treatment     Manual Lymphatic Drainage (MLD)  supraclavicular, deep and superfical abdominal followed by routing of fluid  from inguinal to axillary anastomosis and LE anteriorly B     Compression Bandaging  using 1/2 " foam, cotton and chipbags and multilayer compression bandaging.              PT Education - 06/23/18 1624    Education Details  bandages will loosen the more she moves around.  Take bandages off if they are causing pain, expect increased urination     Person(s) Educated  Patient    Methods  Explanation    Comprehension  Verbalized understanding       PT Short Term Goals - 06/23/18 1632      PT SHORT TERM GOAL #1   Title  Pt volume to decrease by 4 cm to decrease risk of cellulitis     Time  4    Period  Weeks    Status  On-going      PT SHORT TERM GOAL #2   Title  PT to be able to lift her left leg into the car without using her hands     Time  4    Period  Weeks    Status  On-going        PT Long Term Goals - 06/23/18  [place] #1   Title  PT to have lost at least 8 cm of fluid from her left LE to allow pt to be able to ambulate for 15 mintues without resting.     Time  8    Period  Weeks    Status  On-going      PT LONG TERM GOAL #2   Title  Pt to have aquired and have used a compression pump.    Time  8    Period  Weeks    Status  On-going      PT LONG TERM GOAL #3   Title  Pt to have acquired and be able to don and doff compression garment for maintenance stage.     Time  8    Period  Weeks    Status  On-going      PT LONG TERM GOAL #4   Title   Pt to be able to stand for 20 minutes in order to allow pt to cook    Time  8    Period  Weeks    Status  On-going            Plan - 06/24/18 1805    Clinical Impression Statement  No sores present along Lt LE and only 2 circular "raw" areas on anterior Rt LE without drainage.  Covered these areas with xeroform prior to rebandaging.   Completed manual to anterior astpect only due to time.  Made 2 chip bags to place beneath most distal bulge to support and lift.  May need to make more to  put between 2 folds on Lt and fold on Rt to help decompress these if works well.  Able to add remaining 12cm short stretch to last layer for full 5 layers on each LE.  Pt reported overall comfort with bandaging at conclusion of session.       Rehab Potential  Fair    PT Frequency  3x / week    PT Duration  8 weeks    PT Treatment/Interventions  Therapeutic exercise;Manual lymph drainage;Compression bandaging    PT Next Visit Plan  See how patient tolerated chip bags with bandaging.   Measure next session.    PT Home Exercise Plan  ankle pumps, LAQ, hip ab/adduction, hip IR/ER, marching and diaphragmic breathing.     Consulted and Agree with Plan of Care  Patient       Patient will benefit from skilled therapeutic intervention in order to improve the following deficits and impairments:  Abnormal gait, Decreased activity tolerance, Decreased balance, Decreased endurance, Decreased strength, Increased edema, Impaired flexibility, Difficulty walking, Obesity, Pain  Visit Diagnosis: Lymphedema, not elsewhere classified  Difficulty in walking, not elsewhere classified  Muscle weakness (generalized)     Problem List Patient Active Problem List   Diagnosis Date Noted  . Anxiety state, unspecified 03/24/2014  . Gallstone pancreatitis 03/23/2014  . Acute cholecystitis 12/06/2012  . Atrial fibrillation with RVR ([place]) 12/06/2012  . Pancreatitis, acute 12/04/2012  . Hypothyroidism 12/04/2012  . Obesity (BMI 35.0-39.9 without comorbidity) 12/04/2012   Teena Irani, PTA/CLT [phone]  Teena Irani 06/24/2018, 6:15 PM  [facility] [address] [place], [place], [address] Phone: [phone]   Fax:  [phone]  Name: Lauren [name] MRN: [id number] Date of Birth: 05/06/1946

## 2018-06-24 NOTE — Telephone Encounter (Signed)
Patient called stating that she is having op therapy. Has questions about her medication .

## 2018-06-24 NOTE — Telephone Encounter (Signed)
Pt says her weights are remaining the same

## 2018-06-25 ENCOUNTER — Ambulatory Visit (HOSPITAL_COMMUNITY): Payer: Medicare HMO | Attending: Cardiology | Admitting: Physical Therapy

## 2018-06-25 DIAGNOSIS — I890 Lymphedema, not elsewhere classified: Secondary | ICD-10-CM

## 2018-06-25 DIAGNOSIS — R262 Difficulty in walking, not elsewhere classified: Secondary | ICD-10-CM | POA: Diagnosis present

## 2018-06-25 DIAGNOSIS — M6281 Muscle weakness (generalized): Secondary | ICD-10-CM | POA: Diagnosis present

## 2018-06-25 NOTE — Therapy (Signed)
[place], [place], [address] Phone: [phone]   Fax:  [phone]  Physical Therapy Treatment  Patient Details  Name: Lauren [name] MRN: [id number] Date of Birth: [birth year] Referring Provider: Carlyle Dolly   Encounter Date: 06/25/2018  PT End of Session - 06/25/18 1600    Visit Number  4    Number of Visits  24    Date for PT Re-Evaluation  07/10/18    Authorization Type  [employer]     Authorization - Visit Number  4    Authorization - Number of Visits  24    PT Start Time  8466    PT Stop Time  1556    PT Time Calculation (min)  83 min    Activity Tolerance  Patient tolerated treatment well    Behavior During Therapy  [facility] for tasks assessed/performed       Past Medical History:  Diagnosis Date  . Acute cholecystitis 12/06/2012  . Atrial fibrillation with RVR ([place]) 12/06/2012  . Diverticulosis   . Hypothyroidism   . Obesity   . Pancreatitis, acute 12/04/2012  . Periumbilical hernia     Past Surgical History:  Procedure Laterality Date  . CHOLECYSTECTOMY N/A 03/25/2014   Procedure: LAPAROSCOPIC CHOLECYSTECTOMY WITH INTRAOPERATIVE CHOLANGIOGRAM;  Surgeon: Ralene Ok, MD;  Location: [facility];  Service: General;  Laterality: N/A;    There were no vitals filed for this visit.  Subjective Assessment - 06/25/18 1559    Subjective  Pt states the bandages stayed on but got loose by this morning.  States the new sponge things helped (chip packs).     Currently in Pain?  No/denies            LYMPHEDEMA/ONCOLOGY QUESTIONNAIRE - 06/25/18 1601      Right Lower Extremity Lymphedema   20 cm Proximal to Suprapatella  76 cm    10 cm Proximal to Suprapatella  75 cm    At Midpatella/Popliteal Crease  65 cm    30 cm Proximal to Floor at Lateral Plantar Foot  58.5 cm    20 cm Proximal to Floor at Lateral Plantar Foot  48.5 1    10  cm Proximal to Floor at Lateral Malleoli  30 cm    Circumference of  ankle/heel  35 cm.    5 cm Proximal to 1st MTP Joint  24 cm    Across MTP Joint  22.5 cm    Around Proximal Great Toe  7 cm      Left Lower Extremity Lymphedema   20 cm Proximal to Suprapatella  80 cm    10 cm Proximal to Suprapatella  80 cm    At Midpatella/Popliteal Crease  69 cm    30 cm Proximal to Floor at Lateral Plantar Foot  62 cm    20 cm Proximal to Floor at Lateral Plantar Foot  60 cm    10 cm Proximal to Floor at Lateral Malleoli  59.4 cm    Circumference of ankle/heel  34 cm.    5 cm Proximal to 1st MTP Joint  23.4 cm    Across MTP Joint  22.6 cm    Around Proximal Great Toe  7 cm                [facility] Adult PT Treatment/Exercise - 06/25/18 1559      Manual Therapy   Manual Therapy  Manual Lymphatic Drainage (MLD);Compression Bandaging    Manual  therapy comments  done seperate from all other aspects of treatment     Manual Lymphatic Drainage (MLD)  supraclavicular, deep and superfical abdominal followed by routing of fluid from inguinal to axillary anastomosis and LE anteriorly B     Compression Bandaging  using 1/2 " foam, cotton and chipbags and multilayer compression bandaging.                PT Short Term Goals - 06/23/18 1632      PT SHORT TERM GOAL #1   Title  Pt volume to decrease by 4 cm to decrease risk of cellulitis     Time  4    Period  Weeks    Status  On-going      PT SHORT TERM GOAL #2   Title  PT to be able to lift her left leg into the car without using her hands     Time  4    Period  Weeks    Status  On-going        PT Long Term Goals - 06/23/18 1632      PT LONG TERM GOAL #1   Title  PT to have lost at least 8 cm of fluid from her left LE to allow pt to be able to ambulate for 15 mintues without resting.     Time  8    Period  Weeks    Status  On-going      PT LONG TERM GOAL #2   Title  Pt to have aquired and have used a compression pump.    Time  8    Period  Weeks    Status  On-going      PT LONG TERM GOAL  #3   Title  Pt to have acquired and be able to don and doff compression garment for maintenance stage.     Time  8    Period  Weeks    Status  On-going      PT LONG TERM GOAL #4   Title   Pt to be able to stand for 20 minutes in order to allow pt to cook    Time  8    Period  Weeks    Status  On-going            Plan - 06/25/18 1602    Clinical Impression Statement  Remesasured this session with noted reduction in bilateral LE's.  Rt thigh up slightly but otherwise decompressing well.  Noted reduction in induration and no wounds present on either LE.  Pt with questions regarding wear time of garments, treatment time and concerns with immobility while wearing the garments.  Pt verbalized all instructions given by therapist.      Rehab Potential  Fair    PT Frequency  3x / week    PT Duration  8 weeks    PT Treatment/Interventions  Therapeutic exercise;Manual lymph drainage;Compression bandaging    PT Next Visit Plan  See how patient tolerated  bandaging over weekend.   Measure end of week every week.      PT Home Exercise Plan  ankle pumps, LAQ, hip ab/adduction, hip IR/ER, marching and diaphragmic breathing.     Consulted and Agree with Plan of Care  Patient       Patient will benefit from skilled therapeutic intervention in order to improve the following deficits and impairments:  Abnormal gait, Decreased activity tolerance, Decreased balance, Decreased endurance, Decreased strength, Increased edema, Impaired flexibility,  Difficulty walking, Obesity, Pain  Visit Diagnosis: Lymphedema, not elsewhere classified  Difficulty in walking, not elsewhere classified  Muscle weakness (generalized)     Problem List Patient Active Problem List   Diagnosis Date Noted  . Anxiety state, unspecified 03/24/2014  . Gallstone pancreatitis 03/23/2014  . Acute cholecystitis 12/06/2012  . Atrial fibrillation with RVR ([place]) 12/06/2012  . Pancreatitis, acute 12/04/2012  . Hypothyroidism  12/04/2012  . Obesity (BMI 35.0-39.9 without comorbidity) 12/04/2012   Teena Irani, PTA/CLT [phone]  Teena Irani 06/25/2018, 4:13 PM  [facility] [address] [place], [place], [address] Phone: [phone]   Fax:  [phone]  Name: Lauren [name] MRN: [id number] Date of Birth: 08/23/1946

## 2018-06-29 ENCOUNTER — Other Ambulatory Visit: Payer: Self-pay

## 2018-06-29 ENCOUNTER — Ambulatory Visit (HOSPITAL_COMMUNITY): Payer: Medicare HMO | Admitting: Physical Therapy

## 2018-06-29 ENCOUNTER — Encounter (HOSPITAL_COMMUNITY): Payer: Self-pay | Admitting: Physical Therapy

## 2018-06-29 DIAGNOSIS — I890 Lymphedema, not elsewhere classified: Secondary | ICD-10-CM

## 2018-06-29 DIAGNOSIS — R262 Difficulty in walking, not elsewhere classified: Secondary | ICD-10-CM

## 2018-06-29 DIAGNOSIS — M6281 Muscle weakness (generalized): Secondary | ICD-10-CM

## 2018-06-29 NOTE — Therapy (Signed)
[place], [place], [address] Phone: [phone]   Fax:  [phone]  Physical Therapy Treatment  Patient Details  Name: Lauren [name] MRN: [id number] Date of Birth: [birth year] Referring Provider: Carlyle Dolly   Encounter Date: 06/29/2018  PT End of Session - 06/29/18 1523    Visit Number  5    Number of Visits  24    Date for PT Re-Evaluation  07/10/18    Authorization Type  [employer]     Authorization - Visit Number  5    Authorization - Number of Visits  24    PT Start Time  4008    PT Stop Time  1517    PT Time Calculation (min)  85 min    Activity Tolerance  Patient tolerated treatment well    Behavior During Therapy  [facility] for tasks assessed/performed       Past Medical History:  Diagnosis Date  . Acute cholecystitis 12/06/2012  . Atrial fibrillation with RVR ([place]) 12/06/2012  . Diverticulosis   . Hypothyroidism   . Obesity   . Pancreatitis, acute 12/04/2012  . Periumbilical hernia     Past Surgical History:  Procedure Laterality Date  . CHOLECYSTECTOMY N/A 03/25/2014   Procedure: LAPAROSCOPIC CHOLECYSTECTOMY WITH INTRAOPERATIVE CHOLANGIOGRAM;  Surgeon: Ralene Ok, MD;  Location: [facility];  Service: General;  Laterality: N/A;    There were no vitals filed for this visit.  Subjective Assessment - 06/29/18 1356    Subjective  Pt states that the bandaging stayed on until Saturday. States that she is not remembering to complete her exercises.    Pertinent History  CHF, AFib     Limitations  Standing;Walking;House hold activities    How long can you sit comfortably?  no problem     How long can you stand comfortably?  5 mintues     How long can you walk comfortably?  not sure     Currently in Pain?  No/denies                       [facility] Adult PT Treatment/Exercise - 06/29/18 0001      Manual Therapy   Manual Therapy  Manual Lymphatic Drainage (MLD);Compression Bandaging    Manual therapy comments  done seperate from all other aspects of treatment     Manual Lymphatic Drainage (MLD)  supraclavicular, deep and superfical abdominal followed by routing of fluid from inguinal to axillary anastomosis and LE anteriorly B     Compression Bandaging  using 1/2 " foam, cotton and chipbags and multilayer compression bandaging.                PT Short Term Goals - 06/23/18 1632      PT SHORT TERM GOAL #1   Title  Pt volume to decrease by 4 cm to decrease risk of cellulitis     Time  4    Period  Weeks    Status  On-going      PT SHORT TERM GOAL #2   Title  PT to be able to lift her left leg into the car without using her hands     Time  4    Period  Weeks    Status  On-going        PT Long Term Goals - 06/23/18 1632      PT LONG TERM GOAL #1   Title  PT to have lost  at least 8 cm of fluid from her left LE to allow pt to be able to ambulate for 15 mintues without resting.     Time  8    Period  Weeks    Status  On-going      PT LONG TERM GOAL #2   Title  Pt to have aquired and have used a compression pump.    Time  8    Period  Weeks    Status  On-going      PT LONG TERM GOAL #3   Title  Pt to have acquired and be able to don and doff compression garment for maintenance stage.     Time  8    Period  Weeks    Status  On-going      PT LONG TERM GOAL #4   Title   Pt to be able to stand for 20 minutes in order to allow pt to cook    Time  8    Period  Weeks    Status  On-going            Plan - 06/29/18 1523    Clinical Impression Statement  Pt encouraged with treatment.  Therapist stressed the importance of pt completing exercises at home.  Chip bags assisted in proper bandaging of LE>     Rehab Potential  Fair    PT Frequency  3x / week    PT Duration  8 weeks    PT Treatment/Interventions  Therapeutic exercise;Manual lymph drainage;Compression bandaging    PT Next Visit Plan    Measure end of week every week; Thursday or Friday       PT Home Exercise Plan  ankle pumps, LAQ, hip ab/adduction, hip IR/ER, marching and diaphragmic breathing.     Consulted and Agree with Plan of Care  Patient       Patient will benefit from skilled therapeutic intervention in order to improve the following deficits and impairments:  Abnormal gait, Decreased activity tolerance, Decreased balance, Decreased endurance, Decreased strength, Increased edema, Impaired flexibility, Difficulty walking, Obesity, Pain  Visit Diagnosis: Difficulty in walking, not elsewhere classified  Muscle weakness (generalized)  Lymphedema, not elsewhere classified     Problem List Patient Active Problem List   Diagnosis Date Noted  . Anxiety state, unspecified 03/24/2014  . Gallstone pancreatitis 03/23/2014  . Acute cholecystitis 12/06/2012  . Atrial fibrillation with RVR ([place]) 12/06/2012  . Pancreatitis, acute 12/04/2012  . Hypothyroidism 12/04/2012  . Obesity (BMI 35.0-39.9 without comorbidity) 12/04/2012    Rayetta Humphrey, PT CLT [phone] 06/29/2018, 3:26 PM  [facility] [address] [place], [place], [address] Phone: [phone]   Fax:  [phone]  Name: THAO BAUZA MRN: [id number] Date of Birth: [birth year]

## 2018-07-01 ENCOUNTER — Telehealth (HOSPITAL_COMMUNITY): Payer: Self-pay | Admitting: Physical Therapy

## 2018-07-01 ENCOUNTER — Ambulatory Visit (HOSPITAL_COMMUNITY): Payer: Medicare HMO | Admitting: Physical Therapy

## 2018-07-01 ENCOUNTER — Telehealth (HOSPITAL_COMMUNITY): Payer: Self-pay | Admitting: Family Medicine

## 2018-07-01 NOTE — Telephone Encounter (Signed)
07/01/18  pt left a message to cx said she had been running to the bathroom

## 2018-07-01 NOTE — Telephone Encounter (Signed)
Pt had cancelled appt and left message on machine, however staff did not check this.  Called patient who stated she has diarrhea and is unable to make it.  Reminded patient of 24 hour policy regarding viruses and will need to cancel tomorrow if she is still having symptoms. Pt verbalized understanding.    Teena Irani, PTA/CLT [phone]

## 2018-07-02 ENCOUNTER — Telehealth (HOSPITAL_COMMUNITY): Payer: Self-pay | Admitting: Physical Therapy

## 2018-07-02 ENCOUNTER — Ambulatory Visit (HOSPITAL_COMMUNITY): Payer: Medicare HMO | Admitting: Physical Therapy

## 2018-07-02 NOTE — Telephone Encounter (Signed)
Pt is sick and can not come today.

## 2018-07-06 ENCOUNTER — Encounter: Payer: Self-pay | Admitting: Cardiology

## 2018-07-06 ENCOUNTER — Ambulatory Visit: Payer: Medicare HMO | Admitting: Cardiology

## 2018-07-06 ENCOUNTER — Telehealth (HOSPITAL_COMMUNITY): Payer: Self-pay | Admitting: Family Medicine

## 2018-07-06 VITALS — BP 112/71 | HR 77 | Ht 62.0 in | Wt 279.0 lb

## 2018-07-06 DIAGNOSIS — I5032 Chronic diastolic (congestive) heart failure: Secondary | ICD-10-CM

## 2018-07-06 DIAGNOSIS — I890 Lymphedema, not elsewhere classified: Secondary | ICD-10-CM

## 2018-07-06 NOTE — Progress Notes (Signed)
Clinical Summary Lauren [name] is a 71 y.o.female seen today for a focused visit on her history of LE edema related to both diastolic HF and lymphedema.   1. Acute on chronic diastolic heart failure - recent admission 08/2016 with acute CHF - diuresed up to 3 liters during admission. - echo 08/2016 showed LVEF 50-55%, mild MR,    - last visit we changed to toresemide 40mg  daily - referred to lymphedema clinic. Noticing some significant improvement since last visit.  - weight down 5 lbs by her scale, 9 lbs by ours.     SH: brother passed away in 2023-03-11. Past Medical History:  Diagnosis Date  . Acute cholecystitis 12/06/2012  . Atrial fibrillation with RVR ([place]) 12/06/2012  . Diverticulosis   . Hypothyroidism   . Obesity   . Pancreatitis, acute 12/04/2012  . Periumbilical hernia      Allergies  Allergen Reactions  . Codeine Itching  . Compazine [Prochlorperazine Edisylate]   . Prednisone Nausea And Vomiting  . Vicodin [Hydrocodone-Acetaminophen] Itching     Current Outpatient Medications  Medication Sig Dispense Refill  . diltiazem (CARDIZEM CD) 240 MG 24 hr capsule TAKE ONE (1) CAPSULE EACH DAY 90 capsule 3  . levothyroxine (SYNTHROID, LEVOTHROID) 100 MCG tablet Take 1 tablet (100 mcg total) by mouth daily. 30 tablet 0  . metoprolol tartrate (LOPRESSOR) 25 MG tablet Take 1 tablet (25 mg total) 2 (two) times daily by mouth. 60 tablet 1  . potassium chloride SA (K-DUR,KLOR-CON) 20 MEQ tablet TAKE ONE (1) TABLET EACH DAY 90 tablet 3  . torsemide (DEMADEX) 20 MG tablet Take 2 tablets (40 mg total) by mouth daily. 180 tablet 1  . XARELTO 20 MG TABS tablet TAKE 1 TABLET DAILY WITH SUPPER 90 tablet 1   No current facility-administered medications for this visit.      Past Surgical History:  Procedure Laterality Date  . CHOLECYSTECTOMY N/A 03/25/2014   Procedure: LAPAROSCOPIC CHOLECYSTECTOMY WITH INTRAOPERATIVE CHOLANGIOGRAM;  Surgeon: Ralene Ok, MD;  Location: [facility];  Service: General;  Laterality: N/A;     Allergies  Allergen Reactions  . Codeine Itching  . Compazine [Prochlorperazine Edisylate]   . Prednisone Nausea And Vomiting  . Vicodin [Hydrocodone-Acetaminophen] Itching      Family History  Problem Relation Age of Onset  . Cancer Father        liver  . Hypertension Brother   . Heart disease Maternal Grandmother   . Kidney disease Maternal Grandfather   . Diabetes Maternal Grandfather   . Kidney failure Brother      Social History Ms. Laux reports that she has never smoked. She has never used smokeless tobacco. Ms. Mcclimans reports that she does not drink alcohol.   Review of Systems CONSTITUTIONAL: No weight loss, fever, chills, weakness or fatigue.  HEENT: Eyes: No visual loss, blurred vision, double vision or yellow sclerae.No hearing loss, sneezing, congestion, runny nose or sore throat.  SKIN: No rash or itching.  CARDIOVASCULAR: no chest pain, no palpitations.  RESPIRATORY: No shortness of breath, cough or sputum.  GASTROINTESTINAL: No anorexia, nausea, vomiting or diarrhea. No abdominal pain or blood.  GENITOURINARY: No burning on urination, no polyuria NEUROLOGICAL: No headache, dizziness, syncope, paralysis, ataxia, numbness or tingling in the extremities. No change in bowel or bladder control.  MUSCULOSKELETAL: No muscle, back pain, joint pain or stiffness.  LYMPHATICS: No enlarged nodes. No history of splenectomy.  PSYCHIATRIC: No history of depression or anxiety.  ENDOCRINOLOGIC: No reports  of sweating, cold or heat intolerance. No polyuria or polydipsia.  Marland Kitchen   Physical Examination Vitals:   07/06/18 1426  BP: 112/71  Pulse: 77  SpO2: 98%   Vitals:   07/06/18 1426  Weight: 279 lb (126.6 kg)  Height: 5\' 2"  (1.575 m)    Gen: resting comfortably, no acute distress HEENT: no scleral icterus, pupils equal round and reactive, no palptable cervical adenopathy,  CV: RRR, no mr/g, no jvd Resp: Clear to  auscultation bilaterally GI: abdomen is soft, non-tender, non-distended, normal bowel sounds, no hepatosplenomegaly MSK: extremities are warm, 2+ bilateral LE edema Skin: warm, no rash Neuro:  no focal deficits Psych: appropriate affect     Assessment and Plan    1.Chronic diastolic HF - swelling improving since changing to torsemide, continue current meds. Misses some days of torsemide because difficult to be active after taking.   2. Lymphedema - improving with lymphedema clinic treatments, continue   F/u 4 months     Arnoldo Lenis, M.D.

## 2018-07-06 NOTE — Patient Instructions (Signed)
Medication Instructions:  Your physician recommends that you continue on your current medications as directed. Please refer to the Current Medication list given to you today.  Labwork: NONE  Testing/Procedures: NONE  Follow-Up: Your physician recommends that you schedule a follow-up appointment in: 4 MONTHS WITH DR. BRANCH   Any Other Special Instructions Will Be Listed Below (If Applicable).  If you need a refill on your cardiac medications before your next appointment, please call your pharmacy. 

## 2018-07-06 NOTE — Telephone Encounter (Signed)
07/06/18  pt said that she was going to be going to [facility] because it's closer to where she lives, coming here is just wearing her out.

## 2018-07-07 ENCOUNTER — Encounter (HOSPITAL_COMMUNITY): Payer: Medicare HMO | Admitting: Physical Therapy

## 2018-07-08 ENCOUNTER — Encounter (HOSPITAL_COMMUNITY): Payer: Medicare HMO | Admitting: Physical Therapy

## 2018-07-09 ENCOUNTER — Encounter (HOSPITAL_COMMUNITY): Payer: Medicare HMO | Admitting: Physical Therapy

## 2018-07-13 ENCOUNTER — Encounter (HOSPITAL_COMMUNITY): Payer: Medicare HMO | Admitting: Physical Therapy

## 2018-07-15 ENCOUNTER — Encounter (HOSPITAL_COMMUNITY): Payer: Medicare HMO | Admitting: Physical Therapy

## 2018-07-16 ENCOUNTER — Telehealth (HOSPITAL_COMMUNITY): Payer: Self-pay | Admitting: Physical Therapy

## 2018-07-16 NOTE — Telephone Encounter (Signed)
Spoke to [place] she can not reach this pt and wanted to check the phone new in cart. I calle the pt as well and its just busy. Lesleigh Noe will continue to reach the pt and call us back next week. NF8/22/19

## 2018-07-17 ENCOUNTER — Encounter (HOSPITAL_COMMUNITY): Payer: Medicare HMO | Admitting: Physical Therapy

## 2018-07-23 ENCOUNTER — Telehealth: Payer: Self-pay | Admitting: Cardiology

## 2018-07-23 MED ORDER — RIVAROXABAN 20 MG PO TABS: ORAL_TABLET | ORAL | 0 refills | Status: DC

## 2018-07-23 NOTE — Telephone Encounter (Signed)
Patient calling the office for samples of medication:   1.  What medication and dosage are you requesting samples for?  XARELTO 20 MG Tab tablet  2.  Are you currently out of this medication? no

## 2018-07-23 NOTE — Telephone Encounter (Signed)
LM that samples were available

## 2018-07-24 ENCOUNTER — Other Ambulatory Visit: Payer: Self-pay | Admitting: *Deleted

## 2018-07-24 MED ORDER — DILTIAZEM HCL ER COATED BEADS 240 MG PO CP24: ORAL_CAPSULE | ORAL | 3 refills | Status: DC

## 2018-07-24 MED ORDER — POTASSIUM CHLORIDE CRYS ER 20 MEQ PO TBCR: EXTENDED_RELEASE_TABLET | ORAL | 3 refills | Status: DC

## 2018-07-24 MED ORDER — TORSEMIDE 20 MG PO TABS: 40.0000 mg | ORAL_TABLET | Freq: Every day | ORAL | 2 refills | Status: DC

## 2018-08-18 ENCOUNTER — Encounter (HOSPITAL_COMMUNITY): Payer: Self-pay | Admitting: Physical Therapy

## 2018-08-18 NOTE — Therapy (Signed)
[facility] [address] [place], [place], [address] Phone: [phone]   Fax:  [phone]  Patient Details  Name: Lauren [name] MRN: [id number] Date of Birth: 01/04/1946 Referring Provider:  Jonathan Branch   Encounter Date: 08/18/2018 PHYSICAL THERAPY DISCHARGE SUMMARY  Visits from Start of Care:5  Current functional level related to goals / functional outcomes: Not met as pt stopped coming to therapy   Remaining deficits: Same    Education / Equipment: Exercise, pump and the need to obtain compression garment Plan: Patient agrees to discharge.  Patient goals were not met. Patient is being discharged due to not returning since the last visit.  ????? , PT CLT [phone]       , PT CLT [phone] 08/18/2018, 3:15 PM  [facility] [address] [place], [place], [address] Phone: [phone]   Fax:  [phone] 

## 2018-09-18 ENCOUNTER — Telehealth: Payer: Self-pay | Admitting: Cardiology

## 2018-09-18 MED ORDER — RIVAROXABAN 20 MG PO TABS: ORAL_TABLET | ORAL | 0 refills | Status: DC

## 2018-09-18 NOTE — Telephone Encounter (Signed)
Pt aware samples were ready and pt will pick up on Monday - is not out of medication but in the doughnut hole

## 2018-09-18 NOTE — Telephone Encounter (Signed)
Patient calling the office for samples of medication:   1.  What medication and dosage are you requesting samples for?   XARELTO 20   2.  Are you currently out of this medication?  Uncertain

## 2018-10-02 ENCOUNTER — Other Ambulatory Visit: Payer: Self-pay | Admitting: *Deleted

## 2018-10-02 MED ORDER — RIVAROXABAN 20 MG PO TABS: ORAL_TABLET | ORAL | 0 refills | Status: DC

## 2018-10-02 NOTE — Telephone Encounter (Signed)
Pt will come by Monday to pick up samples of Xarelto - she is in doughnut hole

## 2018-11-05 ENCOUNTER — Ambulatory Visit: Payer: Medicare HMO | Admitting: Cardiology

## 2018-12-09 ENCOUNTER — Ambulatory Visit: Payer: Medicare HMO | Admitting: Cardiology

## 2019-01-01 ENCOUNTER — Ambulatory Visit: Payer: Medicare HMO | Admitting: Cardiology

## 2019-04-05 ENCOUNTER — Other Ambulatory Visit: Payer: Self-pay | Admitting: Cardiology

## 2019-05-07 ENCOUNTER — Telehealth: Payer: Self-pay | Admitting: Cardiology

## 2019-05-07 NOTE — Telephone Encounter (Signed)
Virtual Visit Pre-Appointment Phone Call  "(Name), I am calling you today to discuss your upcoming appointment. We are currently trying to limit exposure to the virus that causes COVID-19 by seeing patients at home rather than in the office."  1. "What is the BEST phone number to call the day of the visit?" - include this in appointment notes  2. Do you have or have access to (through a family member/friend) a smartphone with video capability that we can use for your visit?" a. If yes - list this number in appt notes as cell (if different from BEST phone #) and list the appointment type as a VIDEO visit in appointment notes b. If no - list the appointment type as a PHONE visit in appointment notes  3. Confirm consent - "In the setting of the current Covid19 crisis, you are scheduled for a (phone or video) visit with your provider on (date) at (time).  Just as we do with many in-office visits, in order for you to participate in this visit, we must obtain consent.  If you'd like, I can send this to your mychart (if signed up) or email for you to review.  Otherwise, I can obtain your verbal consent now.  All virtual visits are billed to your insurance company just like a normal visit would be.  By agreeing to a virtual visit, we'd like you to understand that the technology does not allow for your provider to perform an examination, and thus may limit your provider's ability to fully assess your condition. If your provider identifies any concerns that need to be evaluated in person, we will make arrangements to do so.  Finally, though the technology is pretty good, we cannot assure that it will always work on either your or our end, and in the setting of a video visit, we may have to convert it to a phone-only visit.  In either situation, we cannot ensure that we have a secure connection.  Are you willing to proceed?" STAFF: Did the patient verbally acknowledge consent to telehealth visit? Document  YES/NO here: yes  4. Advise patient to be prepared - "Two hours prior to your appointment, go ahead and check your blood pressure, pulse, oxygen saturation, and your weight (if you have the equipment to check those) and write them all down. When your visit starts, your provider will ask you for this information. If you have an Apple Watch or Kardia device, please plan to have heart rate information ready on the day of your appointment. Please have a pen and paper handy nearby the day of the visit as well."  5. Give patient instructions for MyChart download to smartphone OR Doximity/Doxy.me as below if video visit (depending on what platform provider is using)  6. Inform patient they will receive a phone call 15 minutes prior to their appointment time (may be from unknown caller ID) so they should be prepared to answer    TELEPHONE CALL NOTE  CORTNEY BEISSEL has been deemed a candidate for a follow-up tele-health visit to limit community exposure during the Covid-19 pandemic. I spoke with the patient via phone to ensure availability of phone/video source, confirm preferred email & phone number, and discuss instructions and expectations.  I reminded METTE SOUTHGATE to be prepared with any vital sign and/or heart rhythm information that could potentially be obtained via home monitoring, at the time of her visit. I reminded AVONDA TOSO to expect a phone call prior to  her visit.  Weston Anna 05/07/2019 4:21 PM   INSTRUCTIONS FOR DOWNLOADING THE MYCHART APP TO SMARTPHONE  - The patient must first make sure to have activated MyChart and know their login information - If Apple, go to [employer] and type in MyChart in the search bar and download the app. If Android, ask patient to go to [employer] and type in Rea in the search bar and download the app. The app is free but as with any other app downloads, their phone may require them to verify saved payment information or Apple/Android  password.  - The patient will need to then log into the app with their MyChart username and password, and select [facility] as their healthcare provider to link the account. When it is time for your visit, go to the MyChart app, find appointments, and click Begin Video Visit. Be sure to Select Allow for your device to access the Microphone and Camera for your visit. You will then be connected, and your provider will be with you shortly.  **If they have any issues connecting, or need assistance please contact MyChart service desk [phone])**  **If using a computer, in order to ensure the best quality for their visit they will need to use either of the following Internet Browsers: [employer], or Google Chrome**  IF USING DOXIMITY or DOXY.ME - The patient will receive a link just prior to their visit by text.     FULL LENGTH CONSENT FOR TELE-HEALTH VISIT   I hereby voluntarily request, consent and authorize [facility] and its employed or contracted physicians, physician assistants, nurse practitioners or other licensed health care professionals (the Practitioner), to provide me with telemedicine health care services (the Services") as deemed necessary by the treating Practitioner. I acknowledge and consent to receive the Services by the Practitioner via telemedicine. I understand that the telemedicine visit will involve communicating with the Practitioner through live audiovisual communication technology and the disclosure of certain medical information by electronic transmission. I acknowledge that I have been given the opportunity to request an in-person assessment or other available alternative prior to the telemedicine visit and am voluntarily participating in the telemedicine visit.  I understand that I have the right to withhold or withdraw my consent to the use of telemedicine in the course of my care at any time, without affecting my right to future care or treatment,  and that the Practitioner or I may terminate the telemedicine visit at any time. I understand that I have the right to inspect all information obtained and/or recorded in the course of the telemedicine visit and may receive copies of available information for a reasonable fee.  I understand that some of the potential risks of receiving the Services via telemedicine include:   Delay or interruption in medical evaluation due to technological equipment failure or disruption;  Information transmitted may not be sufficient (e.g. poor resolution of images) to allow for appropriate medical decision making by the Practitioner; and/or   In rare instances, security protocols could fail, causing a breach of personal health information.  Furthermore, I acknowledge that it is my responsibility to provide information about my medical history, conditions and care that is complete and accurate to the best of my ability. I acknowledge that Practitioner's advice, recommendations, and/or decision may be based on factors not within their control, such as incomplete or inaccurate data provided by me or distortions of diagnostic images or specimens that may result from electronic transmissions. I  understand that the practice of medicine is not an exact science and that Practitioner makes no warranties or guarantees regarding treatment outcomes. I acknowledge that I will receive a copy of this consent concurrently upon execution via email to the email address I last provided but may also request a printed copy by calling the office of [facility].    I understand that my insurance will be billed for this visit.   I have read or had this consent read to me.  I understand the contents of this consent, which adequately explains the benefits and risks of the Services being provided via telemedicine.   I have been provided ample opportunity to ask questions regarding this consent and the Services and have had my questions  answered to my satisfaction.  I give my informed consent for the services to be provided through the use of telemedicine in my medical care  By participating in this telemedicine visit I agree to the above.

## 2019-05-13 ENCOUNTER — Encounter: Payer: Self-pay | Admitting: Cardiology

## 2019-05-13 ENCOUNTER — Telehealth (INDEPENDENT_AMBULATORY_CARE_PROVIDER_SITE_OTHER): Payer: Medicare HMO | Admitting: Cardiology

## 2019-05-13 VITALS — Ht 62.0 in | Wt 262.0 lb

## 2019-05-13 DIAGNOSIS — I890 Lymphedema, not elsewhere classified: Secondary | ICD-10-CM

## 2019-05-13 DIAGNOSIS — I5032 Chronic diastolic (congestive) heart failure: Secondary | ICD-10-CM | POA: Diagnosis not present

## 2019-05-13 DIAGNOSIS — I4891 Unspecified atrial fibrillation: Secondary | ICD-10-CM

## 2019-05-13 DIAGNOSIS — I10 Essential (primary) hypertension: Secondary | ICD-10-CM

## 2019-05-13 MED ORDER — TORSEMIDE 20 MG PO TABS: ORAL_TABLET | ORAL | 1 refills | Status: DC

## 2019-05-13 NOTE — Addendum Note (Signed)
Addended by: Julian Hy T on: 05/13/2019 02:57 PM   Modules accepted: Orders

## 2019-05-13 NOTE — Patient Instructions (Signed)
Your physician recommends that you schedule a follow-up appointment in: [place] has recommended you make the following change in your medication:   TAKE LOPRESSOR 25 MG TWICE DAILY AS NEEDED FOR PALPITATIONS  TAKE TORSEMIDE 20 MG DAILY - MAY TAKE ADDITIONAL 20 MG AS NEEDED FOR SEVERE SWELLING  CALL us IN 1 WEEK TO UPDATE [place] ON WEIGHTS AND DIZZINESS  Thank you for choosing [facility]!!

## 2019-05-13 NOTE — Progress Notes (Signed)
Virtual Visit via Telephone Note   This visit type was conducted due to national recommendations for restrictions regarding the COVID-19 Pandemic (e.g. social distancing) in an effort to limit this patient's exposure and mitigate transmission in our community.  Due to her co-morbid illnesses, this patient is at least at moderate risk for complications without adequate follow up.  This format is felt to be most appropriate for this patient at this time.  The patient did not have access to video technology/had technical difficulties with video requiring transitioning to audio format only (telephone).  All issues noted in this document were discussed and addressed.  No physical exam could be performed with this format.  Please refer to the patient's chart for her  consent to telehealth for [facility].   Date:  05/13/2019   ID:  Lauren [name], DOB Nov 21, 1946, MRN [id number]  Patient Location: Home Provider Location: Office  PCP:  Dione Housekeeper, MD  Cardiologist:  Carlyle Dolly, MD  Electrophysiologist:  None   Evaluation Performed:  Follow-Up Visit  Chief Complaint:  6 month follow up  History of Present Illness:    Lauren [name] is a 73 y.o. female seen today for follow up of the following medical problems.    1.Chronic diastolic heart failure - recent admission 08/2016 with acute CHF - diuresed up to 3 liters during admission. - echo 08/2016 showed LVEF 50-55%, mild MR,     - weight is 262 lbs. DOwn from 279 lbs in 06/2018 - taking torsemide 40mg  daily.  - reports significant orhtostatic symptoms, dizziness with standing  2. Lymphedema - was improving with lymphedema clinic treatments - was not able to complete treatments due to COVID-19 and recent passing of her husband  3. Afib - palpitations at times   4. HTN - she is compliant with meds   SH: husband passed in 12/2018   The patient does not have symptoms concerning for COVID-19 infection (fever,  chills, cough, or new shortness of breath).    Past Medical History:  Diagnosis Date  . Acute cholecystitis 12/06/2012  . Atrial fibrillation with RVR ([place]) 12/06/2012  . Diverticulosis   . Hypothyroidism   . Obesity   . Pancreatitis, acute 12/04/2012  . Periumbilical hernia    Past Surgical History:  Procedure Laterality Date  . CHOLECYSTECTOMY N/A 03/25/2014   Procedure: LAPAROSCOPIC CHOLECYSTECTOMY WITH INTRAOPERATIVE CHOLANGIOGRAM;  Surgeon: Ralene Ok, MD;  Location: [facility];  Service: General;  Laterality: N/A;     No outpatient medications have been marked as taking for the 05/13/19 encounter (Appointment) with Arnoldo Lenis, MD.     Allergies:   Codeine, Compazine [prochlorperazine edisylate], Prednisone, Tetanus toxoids, and Vicodin [hydrocodone-acetaminophen]   Social History   Tobacco Use  . Smoking status: Never Smoker  . Smokeless tobacco: Never Used  Substance Use Topics  . Alcohol use: No  . Drug use: No     Family Hx: The patient's family history includes Cancer in her father; Diabetes in her maternal grandfather; Heart disease in her maternal grandmother; Hypertension in her brother; Kidney disease in her maternal grandfather; Kidney failure in her brother.  ROS:   Please see the history of present illness.     All other systems reviewed and are negative.   Prior CV studies:   The following studies were reviewed today:  n/a  Labs/Other Tests and Data Reviewed:    EKG:  No ECG reviewed.  Recent Labs: No results found for requested labs within last  8760 hours.   Recent Lipid Panel Lab Results  Component Value Date/Time   CHOL 190 12/05/2012 05:31 AM   TRIG 157 (H) 12/05/2012 05:31 AM   HDL 70 12/05/2012 05:31 AM   CHOLHDL 2.7 12/05/2012 05:31 AM   LDLCALC 89 12/05/2012 05:31 AM    Wt Readings from Last 3 Encounters:  07/06/18 279 lb (126.6 kg)  03/24/18 288 lb (130.6 kg)  01/01/18 288 lb (130.6 kg)     Objective:    Vital  Signs:  There were no vitals taken for this visit.   Today's Vitals   05/13/19 1358  Weight: 262 lb (118.8 kg)  Height: 5\' 2"  (1.575 m)   Body mass index is 47.92 kg/m.  Normal affect. Normal speech pattern and tone. Comfortable, no apparent distress. No audible signs of SOB or wheezing  ASSESSMENT & PLAN:    1.Chronic diastolic HF - has done well on torsemide, beginning to have significant orthostatic symptoms. I think she has reached the point of being overdiuresed - lower torsemide to 20mg  daily, may take additional 20mg  as needed for sever swelling - call us 1 week to update [place] on orthostatic symptoms  2. Lymphedema - had to stop lymphedema clinic due to passing of her husband and then limitations by COVID-19 - look to reestablish once able  3. Afib - symptoms at times, orthsotatic symptoms are parcitucarly worst after taking lopressor. Change lopressor to 25mg  bid prn palpitatins, continue her regular dilt    4. HTN - continue current meds  COVID-19 Education: The signs and symptoms of COVID-19 were discussed with the patient and how to seek care for testing (follow up with PCP or arrange E-visit).  The importance of social distancing was discussed today.  Time:   Today, I have spent 20 minutes with the patient with telehealth technology discussing the above problems.     Medication Adjustments/Labs and Tests Ordered: Current medicines are reviewed at length with the patient today.  Concerns regarding medicines are outlined above.   Tests Ordered: No orders of the defined types were placed in this encounter.   Medication Changes: No orders of the defined types were placed in this encounter.   Follow Up:  Virtual Visit 106months  Signed, Carlyle Dolly, MD  05/13/2019 1:23 PM    [facility]

## 2019-05-24 ENCOUNTER — Telehealth: Payer: Self-pay | Admitting: Cardiology

## 2019-05-24 NOTE — Telephone Encounter (Signed)
Patient called stating that she was to call today in regards to her medications.

## 2019-05-24 NOTE — Telephone Encounter (Signed)
Thx for update, I think weight and swelling will improve once she gets back on track with her torsemide  Zandra Abts MD

## 2019-05-24 NOTE — Telephone Encounter (Signed)
Reports weight being up and some swelling because she did not take torsemide since Saturday. Reports taking torsemide 20 mg most days except when she is leaving the house due to having to void so much. Reports missing 2-3 days since last visit but not consecutive days. Weight 169.4 lbs on 06/28 and today 174.2 lbs.  Reports heart is doing well and is beating normal nor having "racing or jumping". Reports not having to take any prn lopressor. Denies SOB,weakness or passing out feelings Reports eating better and she feels better Medications reconciled  Reminded that torsemide 20 is prescribed for daily dosing and one extra as needed for severe swelling

## 2019-07-16 ENCOUNTER — Telehealth: Payer: Self-pay | Admitting: Cardiology

## 2019-07-16 NOTE — Telephone Encounter (Signed)
Stopped diltiazem due to low blood pressures. Copay for xarelto went from $45 to $91. Offered samples but she says that she just got a 30 day supply and doesn't need it at this time. Will contact office when she need samples.

## 2019-07-16 NOTE — Telephone Encounter (Signed)
Patient can not afford Xalreto any longer.  Stated that she will start weaning herself off this medication.

## 2019-08-04 ENCOUNTER — Telehealth: Payer: Self-pay | Admitting: Cardiology

## 2019-08-04 MED ORDER — RIVAROXABAN 20 MG PO TABS: ORAL_TABLET | ORAL | 6 refills | Status: DC

## 2019-08-04 NOTE — Telephone Encounter (Signed)
Patient calling the office for samples of medication:   1.  What medication and dosage are you requesting samples for? Lauren [name] asking for enough for the rest of the year to be picked up today  2.  Are you currently out of this medication?

## 2019-08-04 NOTE — Telephone Encounter (Signed)
Pt will have a friend Anderson [place]) is coming to pick up samples today - aware that we cannot give samples for the rest of the year but could supply a few bottles

## 2019-08-26 ENCOUNTER — Other Ambulatory Visit: Payer: Self-pay | Admitting: Cardiology

## 2019-08-26 NOTE — Telephone Encounter (Signed)
pcp needs to manage this medication

## 2019-08-26 NOTE — Telephone Encounter (Signed)
° ° °  1. Which medications need to be refilled? (please list name of each medication and dose if known)   (SYNTHROID, LEVOTHROID) 100 MCG tablet  Potassium    2. Which pharmacy/location (including street and city if local pharmacy) is medication to be sent to?[place]   3. Do they need a 30 day or 90 day supply?   Patient is changing to [facility]

## 2019-08-27 ENCOUNTER — Other Ambulatory Visit: Payer: Self-pay | Admitting: Cardiology

## 2019-08-27 MED ORDER — POTASSIUM CHLORIDE CRYS ER 20 MEQ PO TBCR: EXTENDED_RELEASE_TABLET | ORAL | 0 refills | Status: DC

## 2019-08-27 NOTE — Telephone Encounter (Signed)
Potassium sent to pharmacy - we do not manage levothyroxine

## 2019-08-27 NOTE — Telephone Encounter (Signed)
Asking to have her  potassium chloride SA (K-DUR,KLOR-CON) 20 MEQ tablet     levothyroxine (SYNTHROID, LEVOTHROID) 100 MCG tablet  90 day supply   [employer]

## 2019-08-30 ENCOUNTER — Telehealth: Payer: Self-pay | Admitting: Cardiology

## 2019-08-30 MED ORDER — RIVAROXABAN 20 MG PO TABS: ORAL_TABLET | ORAL | 6 refills | Status: DC

## 2019-08-30 NOTE — Telephone Encounter (Signed)
No answer

## 2019-08-30 NOTE — Telephone Encounter (Signed)
Patient calling the office for samples of medication: ° ° °1.  What medication and dosage are you requesting samples for? Xalreto ° °2.  Are you currently out of this medication?  ° ° °

## 2019-08-30 NOTE — Telephone Encounter (Signed)
Patient informed samples ready to pick up.  Patient assistance application included also.  4 months follow up (VV) scheduled for December with Dr. Harl Bowie.  Her friend Wallis Mart) will pick up for her & is aware to call the office when she gets to parking lot - nurse will bring out to her.  She verbalized understanding.

## 2019-09-15 ENCOUNTER — Telehealth: Payer: Self-pay | Admitting: Cardiology

## 2019-09-15 MED ORDER — LEVOTHYROXINE SODIUM 100 MCG PO TABS: 100.0000 ug | ORAL_TABLET | Freq: Every day | ORAL | 1 refills | Status: DC

## 2019-09-15 NOTE — Telephone Encounter (Signed)
Can refill with a 30 day supply with 1 refill (so 2 months total). Really needs to reestablish with pcp, offer her a list of local pcp's if needed. I would not be able to refill this medication going forward for her   Zandra Abts MD

## 2019-09-15 NOTE — Telephone Encounter (Signed)
Patient notified and verbalized understanding.  Given names of pmd that she may try contacting.

## 2019-09-15 NOTE — Telephone Encounter (Signed)
°*  STAT* If patient is at the pharmacy, call can be transferred to refill team.   1. Which medications need to be refilled? levothyroxine (SYNTHROID, LEVOTHROID) 100 MCG tablet   2. Which pharmacy/location (including street and city if local pharmacy) is medication to be sent to? [place]  3. Do they need a 30 day or 90 day supply? 90   Patient does not have a PCP at this time and would like to know if Dr Harl Bowie can fill for her

## 2019-09-15 NOTE — Telephone Encounter (Signed)
Will fwd to Dr. Harl Bowie for approval.

## 2019-11-10 ENCOUNTER — Encounter: Payer: Self-pay | Admitting: Cardiology

## 2019-11-10 ENCOUNTER — Telehealth (INDEPENDENT_AMBULATORY_CARE_PROVIDER_SITE_OTHER): Payer: Medicare HMO | Admitting: Cardiology

## 2019-11-10 VITALS — BP 113/80 | HR 96 | Ht 62.0 in | Wt 270.0 lb

## 2019-11-10 DIAGNOSIS — I110 Hypertensive heart disease with heart failure: Secondary | ICD-10-CM

## 2019-11-10 DIAGNOSIS — I890 Lymphedema, not elsewhere classified: Secondary | ICD-10-CM

## 2019-11-10 DIAGNOSIS — I5032 Chronic diastolic (congestive) heart failure: Secondary | ICD-10-CM

## 2019-11-10 DIAGNOSIS — I10 Essential (primary) hypertension: Secondary | ICD-10-CM

## 2019-11-10 DIAGNOSIS — I4891 Unspecified atrial fibrillation: Secondary | ICD-10-CM | POA: Diagnosis not present

## 2019-11-10 DIAGNOSIS — E039 Hypothyroidism, unspecified: Secondary | ICD-10-CM

## 2019-11-10 MED ORDER — TORSEMIDE 20 MG PO TABS: ORAL_TABLET | ORAL | 1 refills | Status: DC

## 2019-11-10 MED ORDER — DILTIAZEM HCL 30 MG PO TABS: 30.0000 mg | ORAL_TABLET | Freq: Two times a day (BID) | ORAL | 1 refills | Status: DC

## 2019-11-10 MED ORDER — LEVOTHYROXINE SODIUM 100 MCG PO TABS: 100.0000 ug | ORAL_TABLET | Freq: Every day | ORAL | 0 refills | Status: DC

## 2019-11-10 NOTE — Progress Notes (Signed)
Virtual Visit via Telephone Note   This visit type was conducted due to national recommendations for restrictions regarding the COVID-19 Pandemic (e.g. social distancing) in an effort to limit this patient's exposure and mitigate transmission in our community.  Due to her co-morbid illnesses, this patient is at least at moderate risk for complications without adequate follow up.  This format is felt to be most appropriate for this patient at this time.  The patient did not have access to video technology/had technical difficulties with video requiring transitioning to audio format only (telephone).  All issues noted in this document were discussed and addressed.  No physical exam could be performed with this format.  Please refer to the patient's chart for her  consent to telehealth for [facility].   Date:  11/10/2019   ID:  Lauren [name], DOB 1946/05/02, MRN [id number]  Patient Location: Home Provider Location: Office  PCP:  Dione Housekeeper, MD  Cardiologist:  Carlyle Dolly, MD  Electrophysiologist:  None   Evaluation Performed:  Follow-Up Visit  Chief Complaint:  Follow up visit  History of Present Illness:    Lauren [name] is a 73 y.o. female seen today for follow up of the following medical problems.    1.Chronic diastolic heart failure - admission 08/2016 with acute CHF - diuresed up to 3 liters during admission. - echo 08/2016 showed LVEF 50-55%, mild MR,   - some increased LE edema. She reports weights from last visit were inaccurate, she never got down to 262 lbs.  - takes toresemide prn  2. Lymphedema -was improving with lymphedema clinic treatments - was not able to complete treatments due to COVID-19 and recent passing of her husband  3. Afib - palpitations at times  - no recent palpitations.  - energy improved off lopressor and diltiazem. Heart rates however at home 100s-120s   4. HTN Compliant with meds  SH: husband passed in  12/2018  The patient does not have symptoms concerning for COVID-19 infection (fever, chills, cough, or new shortness of breath).    Past Medical History:  Diagnosis Date  . Acute cholecystitis 12/06/2012  . Atrial fibrillation with RVR ([place]) 12/06/2012  . Diverticulosis   . Hypothyroidism   . Obesity   . Pancreatitis, acute 12/04/2012  . Periumbilical hernia    Past Surgical History:  Procedure Laterality Date  . CHOLECYSTECTOMY N/A 03/25/2014   Procedure: LAPAROSCOPIC CHOLECYSTECTOMY WITH INTRAOPERATIVE CHOLANGIOGRAM;  Surgeon: Ralene Ok, MD;  Location: [facility];  Service: General;  Laterality: N/A;     No outpatient medications have been marked as taking for the 11/10/19 encounter (Appointment) with Arnoldo Lenis, MD.     Allergies:   Codeine, Compazine [prochlorperazine edisylate], Prednisone, Tetanus toxoids, and Vicodin [hydrocodone-acetaminophen]   Social History   Tobacco Use  . Smoking status: Never Smoker  . Smokeless tobacco: Never Used  Substance Use Topics  . Alcohol use: No  . Drug use: No     Family Hx: The patient's family history includes Cancer in her father; Diabetes in her maternal grandfather; Heart disease in her maternal grandmother; Hypertension in her brother; Kidney disease in her maternal grandfather; Kidney failure in her brother.  ROS:   Please see the history of present illness.     All other systems reviewed and are negative.   Prior CV studies:   The following studies were reviewed today:    Labs/Other Tests and Data Reviewed:    EKG:  No ECG reviewed.  Recent  Labs: No results found for requested labs within last 8760 hours.   Recent Lipid Panel Lab Results  Component Value Date/Time   CHOL 190 12/05/2012 05:31 AM   TRIG 157 (H) 12/05/2012 05:31 AM   HDL 70 12/05/2012 05:31 AM   CHOLHDL 2.7 12/05/2012 05:31 AM   LDLCALC 89 12/05/2012 05:31 AM    Wt Readings from Last 3 Encounters:  05/13/19 262 lb (118.8 kg)   07/06/18 279 lb (126.6 kg)  03/24/18 288 lb (130.6 kg)     Objective:    Vital Signs:   Today's Vitals   11/10/19 1243  BP: 113/80  Pulse: 96  Weight: 270 lb (122.5 kg)  Height: 5\' 2"  (1.575 m)   Body mass index is 49.38 kg/m. Normal affect. Normal speech pattern and tone. Comfortable, no apparent distress. No audible signs of SOB or wheezing.   ASSESSMENT & PLAN:    1.Chronic diastolic HF - some increased edmea per report. Torsemide 40mg  daily was too much, had orthostatic symptoms. 20mg  daily appears to be not quite enough. She will take 20-40mg  prn based on swelling.   2. Lymphedema - had to stop lymphedema clinic due to passing of her husband and then limitations by COVID-19 - reestablish at lymphedema clinic when risk has lowered  3. Afib - low bps and fatigue when on lopressor. She had for a period been on both lopressor and long acting dilt, did not feel well on this regimen - try short acting dilt 30mg  bid.   4. HTN - at goal,follow with the above med changes   5. Hypothyroidism - she does not have a pcp. I agree to refill her synthroid for a 90 day supply withotu refills, reiterated she needs to set up with a pcp  COVID-19 Education: The signs and symptoms of COVID-19 were discussed with the patient and how to seek care for testing (follow up with PCP or arrange E-visit).  The importance of social distancing was discussed today.  Time:   Today, I have spent 21 minutes with the patient with telehealth technology discussing the above problems.     Medication Adjustments/Labs and Tests Ordered: Current medicines are reviewed at length with the patient today.  Concerns regarding medicines are outlined above.   Tests Ordered: No orders of the defined types were placed in this encounter.   Medication Changes: No orders of the defined types were placed in this encounter.   Follow Up:  Either In Person or Virtual in 4 month(s)  Signed, Carlyle Dolly, MD  11/10/2019 11:55 AM    [place]

## 2019-11-10 NOTE — Addendum Note (Signed)
Addended by: Julian Hy T on: 11/10/2019 01:44 PM   Modules accepted: Orders

## 2019-11-10 NOTE — Patient Instructions (Signed)
Your physician recommends that you schedule a follow-up appointment in: [place] has recommended you make the following change in your medication:   TAKE TORSEMIDE 20 MG - 40 MG DAILY AS NEEDED  START DILTIAZEM 30 MG TWICE DAILY   Thank you for choosing [facility]!!

## 2019-11-12 ENCOUNTER — Telehealth: Payer: Self-pay | Admitting: Cardiology

## 2019-11-12 NOTE — Telephone Encounter (Signed)
Per pharmacist at [facility], diltiazem 30 mg tablets can be crushed.   Patient informed.

## 2019-11-12 NOTE — Telephone Encounter (Signed)
Patient can not swallow pills - said she asked for pill that could be crushed for her to take  diltiazem (CARDIZEM) 30 MG tablet

## 2019-11-16 ENCOUNTER — Telehealth: Payer: Self-pay | Admitting: Cardiology

## 2019-11-16 NOTE — Telephone Encounter (Signed)
Pt aware that would be safe to drink 1-2 glasses of wine

## 2019-11-16 NOTE — Telephone Encounter (Signed)
Patient called wanting to check with Dr. Harl Bowie in regards to drinking some wine during the holidays with her heart medications.

## 2019-11-29 ENCOUNTER — Other Ambulatory Visit: Payer: Self-pay | Admitting: Cardiology

## 2020-03-02 ENCOUNTER — Telehealth: Payer: Self-pay | Admitting: Cardiology

## 2020-03-02 NOTE — Telephone Encounter (Signed)
Pt requested refills on levothyroxine - has not established with pcp -says she doesn't want to go to office at this time - make pt aware that we cannot refill this as per Dr Harl Bowie refilled this as a courtesy before - suggested that pt make appt with pcp and maybe do telehealth appt if she was concerned about going to office and have labs done to properly manage thyroid - pt voiced understanding

## 2020-03-02 NOTE — Telephone Encounter (Signed)
Asking for help with her medication levothyroxine (SYNTHROID) 100 MCG tablet   Cannot afford the medication and she has not been out since the [place] began, she needs her car inspected and other issues going.   Stated that she can not come in for her appointment and had it switched to virtual.

## 2020-03-10 ENCOUNTER — Encounter: Payer: Self-pay | Admitting: Cardiology

## 2020-03-10 ENCOUNTER — Telehealth (INDEPENDENT_AMBULATORY_CARE_PROVIDER_SITE_OTHER): Payer: Medicare HMO | Admitting: Cardiology

## 2020-03-10 ENCOUNTER — Telehealth: Payer: Self-pay | Admitting: Cardiology

## 2020-03-10 VITALS — BP 120/70 | Ht 62.0 in | Wt 270.0 lb

## 2020-03-10 DIAGNOSIS — I110 Hypertensive heart disease with heart failure: Secondary | ICD-10-CM | POA: Diagnosis not present

## 2020-03-10 DIAGNOSIS — I5032 Chronic diastolic (congestive) heart failure: Secondary | ICD-10-CM | POA: Diagnosis not present

## 2020-03-10 DIAGNOSIS — I890 Lymphedema, not elsewhere classified: Secondary | ICD-10-CM

## 2020-03-10 DIAGNOSIS — I4891 Unspecified atrial fibrillation: Secondary | ICD-10-CM

## 2020-03-10 NOTE — Progress Notes (Signed)
Virtual Visit via Telephone Note   This visit type was conducted due to national recommendations for restrictions regarding the COVID-19 Pandemic (e.g. social distancing) in an effort to limit this patient's exposure and mitigate transmission in our community.  Due to her co-morbid illnesses, this patient is at least at moderate risk for complications without adequate follow up.  This format is felt to be most appropriate for this patient at this time.  The patient did not have access to video technology/had technical difficulties with video requiring transitioning to audio format only (telephone).  All issues noted in this document were discussed and addressed.  No physical exam could be performed with this format.  Please refer to the patient's chart for her  consent to telehealth for [facility].   The patient was identified using 2 identifiers.  Date:  03/10/2020   ID:  Lauren [name], DOB May 07, 1946, MRN [id number]  Patient Location: Home Provider Location: Office  PCP:  Dione Housekeeper, MD  Cardiologist:  Carlyle Dolly, MD  Electrophysiologist:  None   Evaluation Performed:  Follow-Up Visit  Chief Complaint:  Follow up visit  History of Present Illness:    Lauren [name] is a 74 y.o. female seen today for follow up of the following medical problems.   1.Chronic diastolic heart failure - admission 08/2016 with acute CHF - diuresed up to 3 liters during admission. - echo 08/2016 showed LVEF 50-55%, mild MR,    - weights stable around 270-277 lbs. Takes toresemide 20mg  daily.    2. Lymphedema -wasimproving with lymphedema clinic treatments - was not able to complete treatments due to COVID-19 and recent passing of her husband  3. Afib - no recent palpitations.  - fatigue on lopressor. Reports some fatigue on current dilt as well.     4. HTN - she is compliant with meds     SH: husband passed in 12/2018 Does not want to get covid vaccine.    The patient does not have symptoms concerning for COVID-19 infection (fever, chills, cough, or new shortness of breath).    Past Medical History:  Diagnosis Date  . Acute cholecystitis 12/06/2012  . Atrial fibrillation with RVR ([place]) 12/06/2012  . Diverticulosis   . Hypothyroidism   . Obesity   . Pancreatitis, acute 12/04/2012  . Periumbilical hernia    Past Surgical History:  Procedure Laterality Date  . CHOLECYSTECTOMY N/A 03/25/2014   Procedure: LAPAROSCOPIC CHOLECYSTECTOMY WITH INTRAOPERATIVE CHOLANGIOGRAM;  Surgeon: Ralene Ok, MD;  Location: [place] [facility];  Service: General;  Laterality: N/A;     Current Meds  Medication Sig  . diltiazem (CARDIZEM) 30 MG tablet Take 30 mg by mouth daily.  Marland Kitchen levothyroxine (SYNTHROID) 100 MCG tablet Take 1 tablet (100 mcg total) by mouth daily.  . potassium chloride SA (KLOR-CON) 20 MEQ tablet TAKE ONE (1) TABLET EACH DAY  . rivaroxaban (XARELTO) 20 MG TABS tablet Take 20 mg by mouth every other day.  . torsemide (DEMADEX) 20 MG tablet Take 20 mg by mouth daily.  . [DISCONTINUED] diltiazem (CARDIZEM) 30 MG tablet Take 1 tablet (30 mg total) by mouth 2 (two) times daily. (Patient taking differently: Take 30 mg by mouth daily. )  . [DISCONTINUED] rivaroxaban (XARELTO) 20 MG TABS tablet TAKE (1) TABLET DAILY WITH SUPPER. (Patient taking differently: every other day. TAKE (1) TABLET DAILY WITH SUPPER.)  . [DISCONTINUED] torsemide (DEMADEX) 20 MG tablet TAKE 20-40 MG DAILY AS NEEDED (Patient taking differently: Take 20 mg by mouth daily. )  Allergies:   Codeine, Compazine [prochlorperazine edisylate], Prednisone, Tetanus toxoids, and Vicodin [hydrocodone-acetaminophen]   Social History   Tobacco Use  . Smoking status: Never Smoker  . Smokeless tobacco: Never Used  Substance Use Topics  . Alcohol use: No  . Drug use: No     Family Hx: The patient's family history includes Cancer in her father; Diabetes in her maternal grandfather; Heart  disease in her maternal grandmother; Hypertension in her brother; Kidney disease in her maternal grandfather; Kidney failure in her brother.  ROS:   Please see the history of present illness.     All other systems reviewed and are negative.   Prior CV studies:   The following studies were reviewed today:    Labs/Other Tests and Data Reviewed:    EKG:  No ECG reviewed.  Recent Labs: No results found for requested labs within last 8760 hours.   Recent Lipid Panel Lab Results  Component Value Date/Time   CHOL 190 12/05/2012 05:31 AM   TRIG 157 (H) 12/05/2012 05:31 AM   HDL 70 12/05/2012 05:31 AM   CHOLHDL 2.7 12/05/2012 05:31 AM   LDLCALC 89 12/05/2012 05:31 AM    Wt Readings from Last 3 Encounters:  03/10/20 270 lb (122.5 kg)  11/10/19 270 lb (122.5 kg)  05/13/19 262 lb (118.8 kg)     Objective:    Vital Signs:  BP 120/70   Ht 5\' 2"  (1.575 m)   Wt 270 lb (122.5 kg)   BMI 49.38 kg/m    Normal affect. Normal speech pattern and tone. Comfortable, no apparent distress. No audibles signs of SOB or wheezing.   ASSESSMENT & PLAN:    1.Chronic diastolic HF - some increased edmea per report. Torsemide 40mg  daily was too much, had orthostatic symptoms. 20mg  daily appears to be not quite enough - continue torsemide  2. Lymphedema - had to stop lymphedema clinic due to passing of her husband and then limitations by COVID-19 - reestablish at lymphedema clinic when risk has lowered  3. Afib - low bps and fatigue when on lopressor. - she wants to stop ditliazem due to side effects, feels like heart rates are ok - hold diltiazem, plan for 24 hr monitor.   4. HTN -she is at goal, continue current meds.     COVID-19 Education: The signs and symptoms of COVID-19 were discussed with the patient and how to seek care for testing (follow up with PCP or arrange E-visit).  The importance of social distancing was discussed today.  Time:   Today, I have spent 21 minutes  with the patient with telehealth technology discussing the above problems.     Medication Adjustments/Labs and Tests Ordered: Current medicines are reviewed at length with the patient today.  Concerns regarding medicines are outlined above.   Tests Ordered: No orders of the defined types were placed in this encounter.   Medication Changes: No orders of the defined types were placed in this encounter.   Follow Up:  Either In Person or Virtual in 6 month(s)  Signed, Carlyle Dolly, MD  03/10/2020 12:48 PM    [place]

## 2020-03-10 NOTE — Addendum Note (Signed)
Addended by: Julian Hy T on: 03/10/2020 01:45 PM   Modules accepted: Orders

## 2020-03-10 NOTE — Telephone Encounter (Signed)
Pre-cert Verification for the following procedure    ZIO MONITOR    

## 2020-03-10 NOTE — Patient Instructions (Addendum)
Your physician recommends that you schedule a follow-up appointment in: [place] has recommended you make the following change in your medication:   HOLD DILTIAZEM   ZIO PATCH FOR 24 HOURS - THIS WILL BE MAILED TO YOUR HOME   Thank you for choosing [facility]!!

## 2020-04-19 ENCOUNTER — Telehealth: Payer: Self-pay | Admitting: *Deleted

## 2020-04-19 ENCOUNTER — Other Ambulatory Visit: Payer: Self-pay | Admitting: Cardiology

## 2020-04-19 NOTE — Telephone Encounter (Signed)
Received fax request from [facility] - requesting refill on Xarelto 20mg  - EVERY OTHER DAY.   Please confirm that is correct for this patient.  See it listed this way in med list, but does not look like we ever filled it this way.

## 2020-04-20 NOTE — Telephone Encounter (Signed)
Xarelto should be 20mg  daily. There is never a time when xarelto would be every other day, not sure how that was appearing that way   Zandra Abts MD

## 2020-04-25 NOTE — Telephone Encounter (Signed)
Patient returning call - stated that she spoke with you about this at her last telehealth visit.  Stated that she was bruising so bad & does so much better on this every other day.  States that she has not had as many of the palpitations like she used to & has been doing it this way for about a month or so.

## 2020-04-25 NOTE — Telephone Encounter (Signed)
Must be a miscommunication, I have never used xarelto every other day as this is inadequate dosing and only get 1/2 the protection as far as preventing stroke. We discussed stopping her dilt last visit due to side effects, I don't see any documentation about the xarelto. She could try eliquis 5mg  bid daily to see if better tolerated other than xarelto, we could try her on samples first to see if better tolerated.   Carlyle Dolly MD

## 2020-04-25 NOTE — Telephone Encounter (Signed)
No answer

## 2020-04-26 MED ORDER — RIVAROXABAN 20 MG PO TABS: 20.0000 mg | ORAL_TABLET | ORAL | Status: DC

## 2020-04-26 NOTE — Telephone Encounter (Signed)
Patient notified.  States that she does not feel comfortable making any changes and that she feels better taking as she has been.  Explained risks & benefits to patient as well.  She will discuss further with provider at her follow up in October.

## 2020-08-25 ENCOUNTER — Telehealth: Payer: Self-pay | Admitting: Cardiology

## 2020-08-25 NOTE — Telephone Encounter (Signed)
  Patient Consent for Virtual Visit         Lauren [name] has provided verbal consent on 08/25/2020 for a virtual visit (video or telephone).   CONSENT FOR VIRTUAL VISIT FOR:  Lauren [name]  By participating in this virtual visit I agree to the following:  I hereby voluntarily request, consent and authorize [facility] and its employed or contracted physicians, physician assistants, nurse practitioners or other licensed health care professionals (the Practitioner), to provide me with telemedicine health care services (the "Services") as deemed necessary by the treating Practitioner. I acknowledge and consent to receive the Services by the Practitioner via telemedicine. I understand that the telemedicine visit will involve communicating with the Practitioner through live audiovisual communication technology and the disclosure of certain medical information by electronic transmission. I acknowledge that I have been given the opportunity to request an in-person assessment or other available alternative prior to the telemedicine visit and am voluntarily participating in the telemedicine visit.  I understand that I have the right to withhold or withdraw my consent to the use of telemedicine in the course of my care at any time, without affecting my right to future care or treatment, and that the Practitioner or I may terminate the telemedicine visit at any time. I understand that I have the right to inspect all information obtained and/or recorded in the course of the telemedicine visit and may receive copies of available information for a reasonable fee.  I understand that some of the potential risks of receiving the Services via telemedicine include:  Marland Kitchen Delay or interruption in medical evaluation due to technological equipment failure or disruption; . Information transmitted may not be sufficient (e.g. poor resolution of images) to allow for appropriate medical decision making by the Practitioner;  and/or  . In rare instances, security protocols could fail, causing a breach of personal health information.  Furthermore, I acknowledge that it is my responsibility to provide information about my medical history, conditions and care that is complete and accurate to the best of my ability. I acknowledge that Practitioner's advice, recommendations, and/or decision may be based on factors not within their control, such as incomplete or inaccurate data provided by me or distortions of diagnostic images or specimens that may result from electronic transmissions. I understand that the practice of medicine is not an exact science and that Practitioner makes no warranties or guarantees regarding treatment outcomes. I acknowledge that a copy of this consent can be made available to me via my patient portal ([facility]), or I can request a printed copy by calling the office of [facility].    I understand that my insurance will be billed for this visit.   I have read or had this consent read to me. . I understand the contents of this consent, which adequately explains the benefits and risks of the Services being provided via telemedicine.  . I have been provided ample opportunity to ask questions regarding this consent and the Services and have had my questions answered to my satisfaction. . I give my informed consent for the services to be provided through the use of telemedicine in my medical care

## 2020-09-20 ENCOUNTER — Other Ambulatory Visit: Payer: Self-pay | Admitting: Cardiology

## 2020-09-22 ENCOUNTER — Ambulatory Visit: Payer: Medicare HMO | Admitting: Cardiology

## 2020-11-15 ENCOUNTER — Telehealth: Payer: Medicare HMO | Admitting: Cardiology

## 2021-03-09 ENCOUNTER — Ambulatory Visit: Payer: Medicare HMO | Admitting: Cardiology

## 2021-05-23 ENCOUNTER — Ambulatory Visit: Payer: Medicare HMO | Admitting: Cardiology

## 2021-06-29 DIAGNOSIS — R778 Other specified abnormalities of plasma proteins: Secondary | ICD-10-CM | POA: Insufficient documentation

## 2021-06-29 DIAGNOSIS — K6289 Other specified diseases of anus and rectum: Secondary | ICD-10-CM

## 2021-06-29 DIAGNOSIS — D509 Iron deficiency anemia, unspecified: Secondary | ICD-10-CM | POA: Diagnosis present

## 2021-06-29 DIAGNOSIS — E876 Hypokalemia: Secondary | ICD-10-CM | POA: Insufficient documentation

## 2021-07-03 ENCOUNTER — Emergency Department (HOSPITAL_COMMUNITY): Payer: Medicare HMO

## 2021-07-03 ENCOUNTER — Inpatient Hospital Stay (HOSPITAL_COMMUNITY)
Admission: EM | Admit: 2021-07-03 | Discharge: 2021-07-17 | DRG: 330 | Disposition: A | Discharge status: Skilled Nursing Facility | Payer: Medicare HMO | Attending: Internal Medicine | Admitting: Internal Medicine

## 2021-07-03 ENCOUNTER — Encounter (HOSPITAL_COMMUNITY): Payer: Self-pay | Admitting: Internal Medicine

## 2021-07-03 DIAGNOSIS — F32A Depression, unspecified: Secondary | ICD-10-CM | POA: Diagnosis present

## 2021-07-03 DIAGNOSIS — I5042 Chronic combined systolic (congestive) and diastolic (congestive) heart failure: Secondary | ICD-10-CM | POA: Diagnosis present

## 2021-07-03 DIAGNOSIS — Z20822 Contact with and (suspected) exposure to covid-19: Secondary | ICD-10-CM | POA: Diagnosis present

## 2021-07-03 DIAGNOSIS — Z79890 Hormone replacement therapy (postmenopausal): Secondary | ICD-10-CM

## 2021-07-03 DIAGNOSIS — E039 Hypothyroidism, unspecified: Secondary | ICD-10-CM | POA: Diagnosis present

## 2021-07-03 DIAGNOSIS — D509 Iron deficiency anemia, unspecified: Secondary | ICD-10-CM | POA: Diagnosis not present

## 2021-07-03 DIAGNOSIS — Z9049 Acquired absence of other specified parts of digestive tract: Secondary | ICD-10-CM

## 2021-07-03 DIAGNOSIS — Z6841 Body Mass Index (BMI) 40.0 and over, adult: Secondary | ICD-10-CM

## 2021-07-03 DIAGNOSIS — Z79899 Other long term (current) drug therapy: Secondary | ICD-10-CM

## 2021-07-03 DIAGNOSIS — I890 Lymphedema, not elsewhere classified: Secondary | ICD-10-CM

## 2021-07-03 DIAGNOSIS — K635 Polyp of colon: Secondary | ICD-10-CM | POA: Diagnosis present

## 2021-07-03 DIAGNOSIS — Z885 Allergy status to narcotic agent status: Secondary | ICD-10-CM

## 2021-07-03 DIAGNOSIS — I482 Chronic atrial fibrillation, unspecified: Secondary | ICD-10-CM | POA: Diagnosis not present

## 2021-07-03 DIAGNOSIS — I4891 Unspecified atrial fibrillation: Secondary | ICD-10-CM | POA: Diagnosis not present

## 2021-07-03 DIAGNOSIS — Z9119 Patient's noncompliance with other medical treatment and regimen: Secondary | ICD-10-CM

## 2021-07-03 DIAGNOSIS — Z7901 Long term (current) use of anticoagulants: Secondary | ICD-10-CM

## 2021-07-03 DIAGNOSIS — Z452 Encounter for adjustment and management of vascular access device: Secondary | ICD-10-CM

## 2021-07-03 DIAGNOSIS — K449 Diaphragmatic hernia without obstruction or gangrene: Secondary | ICD-10-CM | POA: Diagnosis present

## 2021-07-03 DIAGNOSIS — Z841 Family history of disorders of kidney and ureter: Secondary | ICD-10-CM

## 2021-07-03 DIAGNOSIS — C182 Malignant neoplasm of ascending colon: Secondary | ICD-10-CM | POA: Diagnosis not present

## 2021-07-03 DIAGNOSIS — Z887 Allergy status to serum and vaccine status: Secondary | ICD-10-CM

## 2021-07-03 DIAGNOSIS — Z888 Allergy status to other drugs, medicaments and biological substances status: Secondary | ICD-10-CM

## 2021-07-03 DIAGNOSIS — K567 Ileus, unspecified: Secondary | ICD-10-CM | POA: Diagnosis not present

## 2021-07-03 DIAGNOSIS — K648 Other hemorrhoids: Secondary | ICD-10-CM | POA: Diagnosis present

## 2021-07-03 DIAGNOSIS — K222 Esophageal obstruction: Secondary | ICD-10-CM | POA: Diagnosis present

## 2021-07-03 DIAGNOSIS — K6289 Other specified diseases of anus and rectum: Secondary | ICD-10-CM

## 2021-07-03 DIAGNOSIS — F419 Anxiety disorder, unspecified: Secondary | ICD-10-CM | POA: Diagnosis present

## 2021-07-03 DIAGNOSIS — Z809 Family history of malignant neoplasm, unspecified: Secondary | ICD-10-CM

## 2021-07-03 DIAGNOSIS — Z8249 Family history of ischemic heart disease and other diseases of the circulatory system: Secondary | ICD-10-CM

## 2021-07-03 DIAGNOSIS — K6389 Other specified diseases of intestine: Secondary | ICD-10-CM

## 2021-07-03 DIAGNOSIS — C189 Malignant neoplasm of colon, unspecified: Secondary | ICD-10-CM

## 2021-07-03 DIAGNOSIS — K5900 Constipation, unspecified: Secondary | ICD-10-CM | POA: Diagnosis present

## 2021-07-03 DIAGNOSIS — K5730 Diverticulosis of large intestine without perforation or abscess without bleeding: Secondary | ICD-10-CM | POA: Diagnosis present

## 2021-07-03 DIAGNOSIS — I110 Hypertensive heart disease with heart failure: Secondary | ICD-10-CM | POA: Diagnosis present

## 2021-07-03 DIAGNOSIS — E876 Hypokalemia: Secondary | ICD-10-CM | POA: Diagnosis not present

## 2021-07-03 DIAGNOSIS — K429 Umbilical hernia without obstruction or gangrene: Secondary | ICD-10-CM | POA: Diagnosis present

## 2021-07-03 HISTORY — DX: Unspecified atrial fibrillation: I48.91

## 2021-07-03 LAB — COMPREHENSIVE METABOLIC PANEL
ALT: 19 U/L (ref 0–44)
AST: 22 U/L (ref 15–41)
Albumin: 2.7 g/dL — ABNORMAL LOW (ref 3.5–5.0)
Alkaline Phosphatase: 104 U/L (ref 38–126)
Anion gap: 5 (ref 5–15)
BUN: 13 mg/dL (ref 8–23)
CO2: 27 mmol/L (ref 22–32)
Calcium: 8.3 mg/dL — ABNORMAL LOW (ref 8.9–10.3)
Chloride: 103 mmol/L (ref 98–111)
Creatinine, Ser: 0.73 mg/dL (ref 0.44–1.00)
GFR, Estimated: >60 mL/min (ref >60)
Glucose, Bld: 105 mg/dL — ABNORMAL HIGH (ref 70–99)
Potassium: 3.8 mmol/L (ref 3.5–5.1)
Sodium: 135 mmol/L (ref 135–145)
Total Bilirubin: 1.3 mg/dL — ABNORMAL HIGH (ref 0.3–1.2)
Total Protein: 5.6 g/dL — ABNORMAL LOW (ref 6.5–8.1)

## 2021-07-03 LAB — CBC WITH DIFFERENTIAL/PLATELET
Abs Immature Granulocytes: 0.05 10*3/uL (ref 0.00–0.07)
Basophils Absolute: 0.1 10*3/uL (ref 0.0–0.1)
Basophils Relative: 1 %
Eosinophils Absolute: 0.1 10*3/uL (ref 0.0–0.5)
Eosinophils Relative: 1 %
HCT: 28.3 % — ABNORMAL LOW (ref 36.0–46.0)
Hemoglobin: 7.8 g/dL — ABNORMAL LOW (ref 12.0–15.0)
Immature Granulocytes: 1 %
Lymphocytes Relative: 15 %
Lymphs Abs: 1.2 10*3/uL (ref 0.7–4.0)
MCH: 20.7 pg — ABNORMAL LOW (ref 26.0–34.0)
MCHC: 27.6 g/dL — ABNORMAL LOW (ref 30.0–36.0)
MCV: 75.3 fL — ABNORMAL LOW (ref 80.0–100.0)
Monocytes Absolute: 0.9 10*3/uL (ref 0.1–1.0)
Monocytes Relative: 11 %
Neutro Abs: 5.9 10*3/uL (ref 1.7–7.7)
Neutrophils Relative %: 71 %
Platelets: 253 10*3/uL (ref 150–400)
RBC: 3.76 MIL/uL — ABNORMAL LOW (ref 3.87–5.11)
RDW: 23.9 % — ABNORMAL HIGH (ref 11.5–15.5)
WBC: 8.3 10*3/uL (ref 4.0–10.5)
nRBC: 0 % (ref 0.0–0.2)

## 2021-07-03 LAB — POC OCCULT BLOOD, ED: Fecal Occult Bld: NEGATIVE

## 2021-07-03 MED ORDER — METOPROLOL TARTRATE 25 MG PO TABS: 25.0000 mg | ORAL_TABLET | Freq: Two times a day (BID) | ORAL | Status: DC

## 2021-07-03 MED ORDER — ACETAMINOPHEN 325 MG PO TABS: 650.0000 mg | ORAL_TABLET | Freq: Four times a day (QID) | ORAL | Status: DC | PRN

## 2021-07-03 MED ORDER — LIDOCAINE VISCOUS HCL 2 % MT SOLN
15.0000 mL | Freq: Once | OROMUCOSAL | Status: AC
  Administered 2021-07-03: 15 mL via OROMUCOSAL
  Filled 2021-07-03: qty 15

## 2021-07-03 MED ORDER — METOPROLOL TARTRATE 5 MG/5ML IV SOLN
10.0000 mg | Freq: Once | INTRAVENOUS | Status: AC
  Administered 2021-07-03: 10 mg via INTRAVENOUS
  Filled 2021-07-03: qty 10

## 2021-07-03 MED ORDER — ACETAMINOPHEN 650 MG RE SUPP: 650.0000 mg | Freq: Four times a day (QID) | RECTAL | Status: DC | PRN

## 2021-07-03 MED ORDER — METOPROLOL TARTRATE 5 MG/5ML IV SOLN
5.0000 mg | INTRAVENOUS | Status: DC | PRN
  Filled 2021-07-03: qty 5

## 2021-07-03 MED ORDER — ALPRAZOLAM 0.5 MG PO TABS
0.5000 mg | ORAL_TABLET | Freq: Once | ORAL | Status: AC
  Administered 2021-07-03: 0.5 mg via ORAL
  Filled 2021-07-03: qty 1

## 2021-07-03 MED ORDER — FLEET ENEMA 7-19 GM/118ML RE ENEM
1.0000 | ENEMA | Freq: Once | RECTAL | Status: AC
  Administered 2021-07-03: 1 via RECTAL

## 2021-07-03 MED ORDER — POLYETHYLENE GLYCOL 3350 17 G PO PACK
17.0000 g | PACK | Freq: Two times a day (BID) | ORAL | Status: DC
  Administered 2021-07-03 – 2021-07-04 (×2): 17 g via ORAL
  Filled 2021-07-03 (×2): qty 1

## 2021-07-03 MED ORDER — POLYETHYLENE GLYCOL 3350 17 G PO PACK: 17.0000 g | PACK | Freq: Every day | ORAL | Status: DC | PRN

## 2021-07-03 MED ORDER — MORPHINE SULFATE (PF) 2 MG/ML IV SOLN
2.0000 mg | Freq: Once | INTRAVENOUS | Status: AC
  Administered 2021-07-03: 2 mg via INTRAVENOUS
  Filled 2021-07-03: qty 1

## 2021-07-03 MED ORDER — PANTOPRAZOLE SODIUM 40 MG IV SOLR
40.0000 mg | INTRAVENOUS | Status: DC
  Administered 2021-07-03 – 2021-07-06 (×3): 40 mg via INTRAVENOUS
  Filled 2021-07-03 (×3): qty 40

## 2021-07-03 MED ORDER — METOPROLOL TARTRATE 50 MG PO TABS
50.0000 mg | ORAL_TABLET | Freq: Two times a day (BID) | ORAL | Status: DC
  Administered 2021-07-03: 50 mg via ORAL
  Filled 2021-07-03: qty 1

## 2021-07-03 MED ORDER — BISACODYL 10 MG RE SUPP
10.0000 mg | Freq: Once | RECTAL | Status: AC
  Administered 2021-07-03: 10 mg via RECTAL
  Filled 2021-07-03: qty 1

## 2021-07-03 MED ORDER — SENNOSIDES-DOCUSATE SODIUM 8.6-50 MG PO TABS
2.0000 | ORAL_TABLET | Freq: Once | ORAL | Status: AC
  Administered 2021-07-03: 2 via ORAL
  Filled 2021-07-03: qty 2

## 2021-07-03 NOTE — ED Provider Notes (Signed)
[facility] EMERGENCY DEPARTMENT Provider Note   CSN: [id number] Arrival date & time: 07/03/21  1105     History Chief Complaint  Patient presents with   Constipation    Lauren [name] is a 75 y.o. female with history of atrial fibrillation, obesity, lower extremity lymphedema and hypothyroidism, was recently admitted at [facility] on August 4 secondary to profound anemia and at that admission was determined that she has a cecal mass that requires further evaluation with colonoscopy.  She was given blood transfusions and her hemoglobin increased to 8.2 the day of her discharge.  She presents here today secondary to rectal pain and constipation, stating she feels she is impacted.  She has tried using MiraLAX and also tried magnesium citrate at the recommendation of a pharmacist, both medications were ineffective and she continues to have constipation and rectal pain.  She denies rectal bleeding.    She denies chest pain or shortness of breath.  She does endorse increased swelling in her bilateral legs associated with her lymphedema.  She has not taken her Cardizem or metoprolol in the past several days because "she just felt too bad".     The history is provided by the patient and medical records.      Past Medical History:  Diagnosis Date   Acute cholecystitis 12/06/2012   Atrial fibrillation with RVR ([place]) 12/06/2012   Diverticulosis    Hypothyroidism    Obesity    Pancreatitis, acute [id number]   Periumbilical hernia     Patient Active Problem List   Diagnosis Date Noted   Atrial fibrillation with rapid ventricular response ([place]) 07/03/2021   Anxiety state, unspecified 03/24/2014   Gallstone pancreatitis 03/23/2014   Acute cholecystitis 12/06/2012   Atrial fibrillation with RVR ([place]) 12/06/2012   Pancreatitis, acute 12/04/2012   Hypothyroidism 12/04/2012   Obesity (BMI 35.0-39.9 without comorbidity) 12/04/2012    Past Surgical History:  Procedure Laterality Date    CHOLECYSTECTOMY N/A 03/25/2014   Procedure: LAPAROSCOPIC CHOLECYSTECTOMY WITH INTRAOPERATIVE CHOLANGIOGRAM;  Surgeon: Ralene Ok, MD;  Location: [facility];  Service: General;  Laterality: N/A;     OB History     Gravida  1   Para      Term      Preterm      AB      Living         SAB      IAB      Ectopic      Multiple      Live Births              Family History  Problem Relation Age of Onset   Cancer Father        liver   Hypertension Brother    Heart disease Maternal Grandmother    Kidney disease Maternal Grandfather    Diabetes Maternal Grandfather    Kidney failure Brother     Social History   Tobacco Use   Smoking status: Never   Smokeless tobacco: Never  Vaping Use   Vaping Use: Never used  Substance Use Topics   Alcohol use: No   Drug use: No    Home Medications Prior to Admission medications   Medication Sig Start Date End Date Taking? Authorizing Provider  diltiazem (CARDIZEM) 30 MG tablet Take 30 mg by mouth daily.    [provider]  levothyroxine (SYNTHROID) 100 MCG tablet Take 1 tablet (100 mcg total) by mouth daily. 11/10/19   Arnoldo Lenis, MD  potassium chloride SA (KLOR-CON) 20 MEQ tablet TAKE ONE (1) TABLET EACH DAY 09/20/20   Arnoldo Lenis, MD  rivaroxaban (XARELTO) 20 MG TABS tablet Take 1 tablet (20 mg total) by mouth every other day. 04/26/20   Arnoldo Lenis, MD  torsemide (DEMADEX) 20 MG tablet Take 20 mg by mouth daily.    [provider]    Allergies    Codeine, Compazine [prochlorperazine edisylate], Prednisone, Tetanus toxoids, and Vicodin [hydrocodone-acetaminophen]  Review of Systems   Review of Systems  Constitutional:  Negative for fever.  HENT:  Negative for congestion and sore throat.   Eyes: Negative.   Respiratory:  Negative for chest tightness and shortness of breath.   Cardiovascular:  Positive for leg swelling. Negative for chest pain.  Gastrointestinal:  Positive for  constipation and rectal pain. Negative for abdominal pain and nausea.  Genitourinary: Negative.   Musculoskeletal:  Negative for arthralgias, joint swelling and neck pain.  Skin: Negative.  Negative for rash and wound.  Neurological:  Negative for dizziness, weakness, light-headedness, numbness and headaches.  Psychiatric/Behavioral: Negative.     Physical Exam Updated Vital Signs BP 122/80   Pulse (!) 106   Temp 98.2 F (36.8 C) (Oral)   Resp 18   Ht '5\' 2"'$  (1.575 m)   Wt 122.5 kg   LMP 09/13/2016   SpO2 100%   BMI 49.38 kg/m   Physical Exam Vitals and nursing note reviewed. Exam conducted with a chaperone present.  Constitutional:      Appearance: She is well-developed.  HENT:     Head: Normocephalic and atraumatic.  Eyes:     Conjunctiva/sclera: Conjunctivae normal.  Cardiovascular:     Rate and Rhythm: Normal rate and regular rhythm.     Heart sounds: Normal heart sounds.     Comments: Bilateral profound lower extremity lymphedema. Pulmonary:     Effort: Pulmonary effort is normal.     Breath sounds: Normal breath sounds. No wheezing.  Abdominal:     General: Abdomen is protuberant. Bowel sounds are normal.     Palpations: Abdomen is soft.  Genitourinary:    Rectum: Guaiac result negative. Tenderness present.     Comments: Small external hemorrhoid.  Pt with hard stool impaction.  Hemoccult negative.  Musculoskeletal:        General: Normal range of motion.     Cervical back: Normal range of motion.  Skin:    General: Skin is warm and dry.  Neurological:     Mental Status: She is alert.    ED Results / Procedures / Treatments   Labs (all labs ordered are listed, but only abnormal results are displayed) Labs Reviewed  CBC WITH DIFFERENTIAL/PLATELET - Abnormal; Notable for the following components:      Result Value   RBC 3.76 (*)    Hemoglobin 7.8 (*)    HCT 28.3 (*)    MCV 75.3 (*)    MCH 20.7 (*)    MCHC 27.6 (*)    RDW 23.9 (*)    All other  components within normal limits  COMPREHENSIVE METABOLIC PANEL - Abnormal; Notable for the following components:   Glucose, Bld 105 (*)    Calcium 8.3 (*)    Total Protein 5.6 (*)    Albumin 2.7 (*)    Total Bilirubin 1.3 (*)    All other components within normal limits  POC OCCULT BLOOD, ED    EKG None  Radiology DG Abdomen 1 View  Result Date: 07/03/2021  CLINICAL DATA:  Constipation EXAM: ABDOMEN - 1 VIEW COMPARISON:  CT pelvis June 28, 2021 FINDINGS: The bowel gas pattern is normal. Moderate volume of formed stool throughout the colon. Degenerative changes bilateral hips. Lumbar spondylosis. IMPRESSION: Nonobstructive bowel gas pattern. Moderate volume of formed stool throughout the colon. Electronically Signed   By: Dahlia Bailiff MD   On: 07/03/2021 17:45    Procedures Procedures   Medications Ordered in ED Medications  bisacodyl (DULCOLAX) suppository 10 mg (10 mg Rectal Given 07/03/21 1411)  sodium phosphate (FLEET) 7-19 GM/118ML enema 1 enema (1 enema Rectal Given 07/03/21 1411)  metoprolol tartrate (LOPRESSOR) injection 10 mg (10 mg Intravenous Given 07/03/21 1750)  lidocaine (XYLOCAINE) 2 % viscous mouth solution 15 mL (15 mLs Mouth/Throat Given 07/03/21 1729)    ED Course  I have reviewed the triage vital signs and the nursing notes.  Pertinent labs & imaging results that were available during my care of the patient were reviewed by me and considered in my medical decision making (see chart for details).    MDM Rules/Calculators/A&P                           Patient with complaints of constipation and rectal pain who is moderately impacted here.  She was given a Dulcolax suppository followed by a fleets enema.  However she was unable to retain the fleets in order for it to be helpful.  However it reexam her stool was softer and was not felt like she was still impacted.  Some stool was actually digitally disimpacted.  She continued to have complaints of pain at her rectum  however.  KUB obtained which did show moderate stool throughout her colon.  She continues to be in moderate distress with complaints of persistent pain at her rectum.  She was given some lidocaine jelly applied topically which improved but did not alleviate rectal pain.   It was also noted that she is in A. fib with RVR.  She states she has not taken her metoprolol or her Cardizem in the past several days.  She was given some IV metoprolol which she responded to, her heart rate is now in the mid 90s.  She denies chest pain or shortness of breath currently but does report chronic sob with exertion.  Discussed with Dr. Denton Brick who accepts pt for admission. Final Clinical Impression(s) / ED Diagnoses Final diagnoses:  Constipation, unspecified constipation type  Atrial fibrillation with rapid ventricular response [facility])    Rx / DC Orders ED Discharge Orders     None        Landis Martins 07/03/21 1930    Truddie Hidden, MD 07/04/21 [phone]

## 2021-07-03 NOTE — ED Notes (Signed)
Pt placed on cardiac monitor with BP to set cycle every 30 minutes. Continuous pulse oximeter applied.  

## 2021-07-03 NOTE — H&P (Addendum)
History and Physical    Lauren [name] [id number] DOB: 06-Jun-1946 DOA: 07/03/2021  PCP: Pcp, No   Patient coming from: Home  I have personally briefly reviewed patient's old medical records in [place]  Chief Complaint: Constipation  HPI: Lauren [name] is a 75 y.o. female with medical history significant for atrial fibrillation, morbid obesity, hypothyroidism. Patient presented to the ED with complaints of constipation.    Patient was just admitted at [facility] 8 from 8/4-8/6.  Per discharge summary, patient left the ED AMA.  Patient had not had a bowel movement for 5 days, she was diagnosed with a rectal mass that was seen on CT.  General surgery was consulted for colonoscopy, as patient had not had it done before, and patient reported a history of colon cancer in her father.  Patient's hemoglobin was 5.6, she was transfused, discharge hemoglobin was 8.2. Morning of the 6th, patient had a bowel movement and declined colonoscopy, left AMA.  Patient reports she has not taken her home medications in at least a week.  She reports she is on metoprolol, and Cardizem.  She also reports she has not taken her Xarelto in about a week.  At this time she is complaining of pain in her rectum and is tearful.  No chest pain no difficulty breathing.  ED Course: Initial tachycardia to 168, blood pressure systolic [id number].  EKG shows atrial fibrillation rate 140.  Hemoglobin 7.8.  Stool FOBT negative.  X-ray shows moderate volume of formed stool throughout the colon. Fecal disimpaction was attempted in the ED, patient initially did not tolerate it well, she subsequently had Dulcolax and Fleet enema-was not able to retain it, but stool was later disimpacted. 10 mg metoprolol given, with improvement in heart rate to low 100s.  Hospitalist to admit for atrial fibrillation with RVR, anemia and constipation.  Review of Systems: As per HPI all other systems reviewed and negative.  Past Medical History:   Diagnosis Date   Acute cholecystitis 12/06/2012   Atrial fibrillation with RVR ([place]) 12/06/2012   Diverticulosis    Hypothyroidism    Obesity    Pancreatitis, acute [id number]   Periumbilical hernia     Past Surgical History:  Procedure Laterality Date   CHOLECYSTECTOMY N/A 03/25/2014   Procedure: LAPAROSCOPIC CHOLECYSTECTOMY WITH INTRAOPERATIVE CHOLANGIOGRAM;  Surgeon: Ralene Ok, MD;  Location: [facility];  Service: General;  Laterality: N/A;     reports that she has never smoked. She has never used smokeless tobacco. She reports that she does not drink alcohol and does not use drugs.  Allergies  Allergen Reactions   Codeine Itching   Compazine [Prochlorperazine Edisylate]    Prednisone Nausea And Vomiting   Tetanus Toxoids Other (See Comments)    Knot like "hen egg" came up    Vicodin [Hydrocodone-Acetaminophen] Itching    Family History  Problem Relation Age of Onset   Cancer Father        liver   Hypertension Brother    Heart disease Maternal Grandmother    Kidney disease Maternal Grandfather    Diabetes Maternal Grandfather    Kidney failure Brother    Prior to Admission medications   Medication Sig Start Date End Date Taking? Authorizing Provider  diltiazem (CARDIZEM) 30 MG tablet Take 30 mg by mouth daily.    [provider]  levothyroxine (SYNTHROID) 100 MCG tablet Take 1 tablet (100 mcg total) by mouth daily. 11/10/19   Arnoldo Lenis, MD  potassium chloride SA (KLOR-CON)  20 MEQ tablet TAKE ONE (1) TABLET EACH DAY 09/20/20   Arnoldo Lenis, MD  rivaroxaban (XARELTO) 20 MG TABS tablet Take 1 tablet (20 mg total) by mouth every other day. 04/26/20   Arnoldo Lenis, MD  torsemide (DEMADEX) 20 MG tablet Take 20 mg by mouth daily.    [provider]    Physical Exam: Vitals:   07/03/21 1830 07/03/21 1900 07/03/21 1930 07/03/21 2000  BP: 117/79 122/80 124/87 (!) 126/112  Pulse: (!) 126 (!) 106 (!) 110 (!) 108  Resp:  18  18  Temp:       TempSrc:      SpO2: 97% 100% 100% 97%  Weight:      Height:        Constitutional: Morbidly obese, easily becomes very anxious, and then intermittently tearful, complaining of rectal pain  Vitals:   07/03/21 1830 07/03/21 1900 07/03/21 1930 07/03/21 2000  BP: 117/79 122/80 124/87 (!) 126/112  Pulse: (!) 126 (!) 106 (!) 110 (!) 108  Resp:  18  18  Temp:      TempSrc:      SpO2: 97% 100% 100% 97%  Weight:      Height:       Eyes: PERRL, lids and conjunctivae normal ENMT: Mucous membranes are moist.  Neck: normal, supple, no masses, no thyromegaly Respiratory: clear to auscultation bilaterally, no wheezing, no crackles. Normal respiratory effort. No accessory muscle use.  Cardiovascular: Tachycardic, irregular rate and rhythm, no murmurs / rubs / gallops.  Bilateral lower extremity with significant pannus/lymphedema.  2+ pedal pulses.   Abdomen: no significant tenderness, no erythema or redness, skin is intact, no suggestion of cellulitis/panniculitis at this time, no masses palpated. No hepatosplenomegaly. Bowel sounds positive.  Musculoskeletal: no clubbing / cyanosis. No joint deformity upper and lower extremities. Normal muscle tone.  Skin: no rashes, lesions, ulcers. No induration Neurologic: No apparent cranial nerve abnormality, moving extremities spontaneously. Psychiatric: Normal judgment and insight. Alert and oriented x 3. Normal mood.   Labs on Admission: I have personally reviewed following labs and imaging studies  CBC: Recent Labs  Lab 07/03/21 1342  WBC 8.3  NEUTROABS 5.9  HGB 7.8*  HCT 28.3*  MCV 75.3*  PLT 123456   Basic Metabolic Panel: Recent Labs  Lab 07/03/21 1342  NA 135  K 3.8  CL 103  CO2 27  GLUCOSE 105*  BUN 13  CREATININE 0.73  CALCIUM 8.3*   Liver Function Tests: Recent Labs  Lab 07/03/21 1342  AST 22  ALT 19  ALKPHOS 104  BILITOT 1.3*  PROT 5.6*  ALBUMIN 2.7*   Urine analysis:    Component Value Date/Time   COLORURINE  YELLOW 03/26/2014 [place] 03/26/2014 1346   LABSPEC 1.011 03/26/2014 1346   PHURINE 5.0 03/26/2014 1346   GLUCOSEU NEGATIVE 03/26/2014 1346   HGBUR MODERATE (A) 03/26/2014 1346   BILIRUBINUR NEGATIVE 03/26/2014 1346   KETONESUR NEGATIVE 03/26/2014 1346   PROTEINUR NEGATIVE 03/26/2014 1346   UROBILINOGEN 0.2 03/26/2014 1346   NITRITE NEGATIVE 03/26/2014 1346   LEUKOCYTESUR NEGATIVE 03/26/2014 1346    Radiological Exams on Admission: DG Abdomen 1 View  Result Date: 07/03/2021 CLINICAL DATA:  Constipation EXAM: ABDOMEN - 1 VIEW COMPARISON:  CT pelvis June 28, 2021 FINDINGS: The bowel gas pattern is normal. Moderate volume of formed stool throughout the colon. Degenerative changes bilateral hips. Lumbar spondylosis. IMPRESSION: Nonobstructive bowel gas pattern. Moderate volume of formed stool throughout the colon. Electronically  Signed   By: Dahlia Bailiff MD   On: 07/03/2021 17:45    EKG: Independently reviewed.  Atrial fibrillation rate 140.  QTc 521.  No significant change from prior.  Assessment/Plan Principal Problem:   Atrial fibrillation with RVR (H[facility]) Active Problems:   Hypothyroidism   Iron deficiency anemia   Chronic acquired lymphedema    Atria fibrillation with RVR-heart rate up to 160s, improved with 10 mg of metoprolol.  Home medications metoprolol and Cardizem.  Left AMA from [facility] 8/6, home dose of metoprolol was increased to 50 mg twice daily during hospitalization.  Recent echo per [facility] 06/29/2021, shows EF of 30%. -Resume metoprolol 50 twice daily - IV metoprolol 5 mg as needed -Hold Cardizem '240mg'$  with echo results 8/5 -Hold anticoagulation pending GI evaluation.  Iron deficiency anemia- hgb 7.8.  Unknown chronicity.  Last check 2017 hemoglobin was 13.8.  Required transfusion at [facility] for hemoglobin of 5.6.  Discharge hemoglobin 8.2.  Stool occult negative.  She denies melena, hematemesis or hematochezia.  Denies NSAID use.  Recent  anemia panel shows low ferritin 5.4, low serum iron 16, low iron saturation at 4.  She reports she never had a colonoscopy. -N.p.o. midnight -GI consult -Hold Xarelto for now -IV Protonix 40 daily  Abnormal CT-06/28/2021, cecal thickening concerning for cecal mass with adjacent small lymph nodes.  Also suggested panniculitis/cellulitis, abdominal exam is benign without evidence of either. -GI consult   Constipation-Fleet enema given in ED.  Per ED provider some stool disimpacted.  Xray today shows moderate stool burden, nonobstructive bowel gas pattern. -Will start bowel regimen with Senokot and MiraLAX twice daily  Morbid obesity BMI 49, with bilateral lymphedema  DVT prophylaxis: SCDs Code Status: Full code Family Communication: None at bedside Disposition Plan:  ~ 2 days Consults called: GI Admission status: Obs tele     Bethena Roys MD [facility]  07/03/2021, 9:11 PM

## 2021-07-03 NOTE — ED Triage Notes (Signed)
Pt here by [facility] for constipation; per EMS pt was seen at [facility] for same last week and advised she has a mass and needs to follow-up for a colonoscopy but has not

## 2021-07-03 NOTE — ED Notes (Signed)
Pt verbalized she went to [facility] yesterday and left AMA . Yesterday pt was told she has a mass and that was interfering with her bm.

## 2021-07-04 ENCOUNTER — Other Ambulatory Visit: Payer: Self-pay

## 2021-07-04 DIAGNOSIS — E038 Other specified hypothyroidism: Secondary | ICD-10-CM | POA: Diagnosis not present

## 2021-07-04 DIAGNOSIS — I5042 Chronic combined systolic (congestive) and diastolic (congestive) heart failure: Secondary | ICD-10-CM

## 2021-07-04 DIAGNOSIS — I890 Lymphedema, not elsewhere classified: Secondary | ICD-10-CM | POA: Diagnosis not present

## 2021-07-04 DIAGNOSIS — K6289 Other specified diseases of anus and rectum: Secondary | ICD-10-CM

## 2021-07-04 DIAGNOSIS — Z6841 Body Mass Index (BMI) 40.0 and over, adult: Secondary | ICD-10-CM

## 2021-07-04 DIAGNOSIS — D508 Other iron deficiency anemias: Secondary | ICD-10-CM | POA: Diagnosis not present

## 2021-07-04 DIAGNOSIS — I4891 Unspecified atrial fibrillation: Secondary | ICD-10-CM | POA: Diagnosis not present

## 2021-07-04 DIAGNOSIS — K6389 Other specified diseases of intestine: Secondary | ICD-10-CM

## 2021-07-04 DIAGNOSIS — K5900 Constipation, unspecified: Secondary | ICD-10-CM

## 2021-07-04 LAB — CBC
HCT: 28.9 % — ABNORMAL LOW (ref 36.0–46.0)
Hemoglobin: 7.8 g/dL — ABNORMAL LOW (ref 12.0–15.0)
MCH: 20.5 pg — ABNORMAL LOW (ref 26.0–34.0)
MCHC: 27 g/dL — ABNORMAL LOW (ref 30.0–36.0)
MCV: 75.9 fL — ABNORMAL LOW (ref 80.0–100.0)
Platelets: 265 10*3/uL (ref 150–400)
RBC: 3.81 MIL/uL — ABNORMAL LOW (ref 3.87–5.11)
RDW: 24 % — ABNORMAL HIGH (ref 11.5–15.5)
WBC: 6.5 10*3/uL (ref 4.0–10.5)
nRBC: 0 % (ref 0.0–0.2)

## 2021-07-04 LAB — BASIC METABOLIC PANEL
Anion gap: 6 (ref 5–15)
BUN: 11 mg/dL (ref 8–23)
CO2: 27 mmol/L (ref 22–32)
Calcium: 8.4 mg/dL — ABNORMAL LOW (ref 8.9–10.3)
Chloride: 103 mmol/L (ref 98–111)
Creatinine, Ser: 0.65 mg/dL (ref 0.44–1.00)
GFR, Estimated: >60 mL/min (ref >60)
Glucose, Bld: 83 mg/dL (ref 70–99)
Potassium: 3.7 mmol/L (ref 3.5–5.1)
Sodium: 136 mmol/L (ref 135–145)

## 2021-07-04 LAB — SARS CORONAVIRUS 2 (TAT 6-24 HRS): SARS Coronavirus 2: NEGATIVE

## 2021-07-04 LAB — GLUCOSE, CAPILLARY
Glucose-Capillary: 85 mg/dL (ref 70–99)
Glucose-Capillary: 83 mg/dL (ref 70–99)

## 2021-07-04 MED ORDER — METOPROLOL TARTRATE 25 MG PO TABS
12.5000 mg | ORAL_TABLET | Freq: Once | ORAL | Status: AC
  Administered 2021-07-04: 12.5 mg via ORAL
  Filled 2021-07-04: qty 1

## 2021-07-04 MED ORDER — SODIUM CHLORIDE 0.9 % IV BOLUS
250.0000 mL | Freq: Once | INTRAVENOUS | Status: AC
  Administered 2021-07-04: 250 mL via INTRAVENOUS

## 2021-07-04 MED ORDER — TORSEMIDE 20 MG PO TABS
20.0000 mg | ORAL_TABLET | Freq: Every day | ORAL | Status: DC
  Administered 2021-07-05 – 2021-07-08 (×3): 20 mg via ORAL
  Filled 2021-07-04 (×4): qty 1

## 2021-07-04 MED ORDER — PEG 3350-KCL-NA BICARB-NACL 420 G PO SOLR
4000.0000 mL | Freq: Once | ORAL | Status: AC
  Administered 2021-07-04: 4000 mL via ORAL

## 2021-07-04 MED ORDER — METOPROLOL TARTRATE 25 MG PO TABS
25.0000 mg | ORAL_TABLET | Freq: Two times a day (BID) | ORAL | Status: DC
  Administered 2021-07-04 – 2021-07-05 (×2): 25 mg via ORAL
  Filled 2021-07-04 (×3): qty 1

## 2021-07-04 MED ORDER — SODIUM CHLORIDE 0.9 % IV BOLUS
500.0000 mL | Freq: Once | INTRAVENOUS | Status: AC
  Administered 2021-07-04: 500 mL via INTRAVENOUS

## 2021-07-04 NOTE — Progress Notes (Signed)
Patients blood pressure dropped between the [id number] systolic. MD ordered 500 bolus of normal saline. Patient is asymptomatic at this time. Will continue to monitor.

## 2021-07-04 NOTE — H&P (View-Only) (Signed)
Referring Provider: No ref. provider found Primary Care Physician:  Pcp, No Primary Gastroenterologist:  Dr. Laural Golden  Date of Admission: 07/03/21 Date of Consultation: 07/04/21  Reason for Consultation:  possible cecal mass, anemia  HPI:  Lauren [name] is a 75 y.o. year old female with pmh of a fib, morbid obesity, diastolic CHF, HTN, lymphedema, presenting with constipation and rectal pain for the past 2 days. Recent admitted at [facility] from 8/4-06/30/21 for constipation. During that admission, patient was noted to have hgb of 5.6. she was transfused 2 units of PRBCs. Her discharge hgb was 8. She had CT abd/pelvis which showed a cecal thickening raising concern of possible cecal mass with small adjacent lymph nodes. Patient was scheduled for colonoscopy by general surgery but she left AMA prior to procedure being done. Recent cardiac echo on 06/28/21 showed EG of 30%. Patient reports noncompliance with medications including torsemide, xarelto, diltiazem since her discharge from [facility] and even before hospitalization.   ED Course: patient presented to ED on 8/9 with c/o rectal pain and constipation, concerns for fecal impaction, taking miralax at home as well as mag citrate without results. She denied any rectal bleeding. She was hemodynamically stable, afebrile, noted to have a fib with RVR (given IV metoprolol which brought HR down to the 90s). WBC 8.3, hgb 7.8, plt 265,000, LFTs WNL. Iron sat and ferritin 4 and 5 respectively on 8/5. Found to be moderately impacted in ED, given dulcolax suppository followed by fleet enema. She was unable to retain fleet enema long enough for it to be helpful. On reexam, stool felt softer and patient felt less impacted, some stool was digitally disimpacted as well, however, patient continued to c/o rectal pain, KUB showed moderate colonic stool burden. She was given lidocaine jelly which improved rectal pain but did not alleviate it.   Consult: Patient reports she is  very tired today and has been over the past few days. States she has been having issues with constipation for about 1 week. She was taking 1/2 capful of miralax for a few days at home. She denies any abdominal pain, nausea or vomiting. Reports she had a BM last night and this morning. Reports they were fairly good sized and she feels much better this morning. Denies melena or hematochezia. She denies rectal pain currently, and states it has resolved since having BMs during admission. Patient is requesting to eat as she has not eaten in the past 3-4 days. Unsure of last dose of xarelto, reports atleast 3 days ago but could be up to 1 week ago.   Past Medical History:  Diagnosis Date   Acute cholecystitis 12/06/2012   Atrial fibrillation with RVR ([place]) 12/06/2012   Diverticulosis    Hypothyroidism    Obesity    Pancreatitis, acute [id number]   Periumbilical hernia     Past Surgical History:  Procedure Laterality Date   CHOLECYSTECTOMY N/A 03/25/2014   Procedure: LAPAROSCOPIC CHOLECYSTECTOMY WITH INTRAOPERATIVE CHOLANGIOGRAM;  Surgeon: Ralene Ok, MD;  Location: [facility];  Service: General;  Laterality: N/A;    Prior to Admission medications   Medication Sig Start Date End Date Taking? Authorizing Provider  acetaminophen (TYLENOL) 500 MG tablet Take 1,000 mg by mouth every 6 (six) hours as needed.   Yes [provider]  levothyroxine (SYNTHROID) 100 MCG tablet Take 1 tablet (100 mcg total) by mouth daily. 11/10/19  Yes BranchAlphonse Guild, MD  magnesium citrate SOLN Take by mouth.   Yes [provider]  rivaroxaban (XARELTO) 20 MG TABS tablet Take 1 tablet (20 mg total) by mouth every other day. 04/26/20  Yes Branch, Alphonse Guild, MD  diltiazem (CARDIZEM) 30 MG tablet Take 30 mg by mouth daily. Patient not taking: No sig reported    [provider]  levothyroxine (SYNTHROID) 100 MCG tablet Take 1 tablet by mouth daily. Patient not taking: No sig reported 06/12/20    [provider]  potassium chloride SA (KLOR-CON) 20 MEQ tablet TAKE ONE (1) TABLET EACH DAY Patient not taking: No sig reported 09/20/20   Arnoldo Lenis, MD  torsemide (DEMADEX) 20 MG tablet Take 20 mg by mouth daily. Patient not taking: Reported on 07/03/2021    [provider]    Current Facility-Administered Medications  Medication Dose Route Frequency Provider Last Rate Last Admin   acetaminophen (TYLENOL) tablet 650 mg  650 mg Oral Q6H PRN Emokpae, Ejiroghene E, MD       Or   acetaminophen (TYLENOL) suppository 650 mg  650 mg Rectal Q6H PRN Emokpae, Ejiroghene E, MD       metoprolol tartrate (LOPRESSOR) injection 5 mg  5 mg Intravenous Q5 min PRN Emokpae, Ejiroghene E, MD       metoprolol tartrate (LOPRESSOR) tablet 25 mg  25 mg Oral BID Tat, David, MD       pantoprazole (PROTONIX) injection 40 mg  40 mg Intravenous Q24H Emokpae, Ejiroghene E, MD   40 mg at 07/03/21 2238   polyethylene glycol (MIRALAX / GLYCOLAX) packet 17 g  17 g Oral BID Emokpae, Ejiroghene E, MD   17 g at 07/04/21 0944   polyethylene glycol (MIRALAX / GLYCOLAX) packet 17 g  17 g Oral Daily PRN Emokpae, Ejiroghene E, MD       torsemide (DEMADEX) tablet 20 mg  20 mg Oral Daily Tat, David, MD        Allergies as of 07/03/2021 - Review Complete 07/03/2021  Allergen Reaction Noted   Codeine Itching 10/10/2012   Compazine [prochlorperazine edisylate]  10/10/2012   Prednisone Nausea And Vomiting 12/04/2012   Tetanus toxoids Other (See Comments) 07/06/2018   Vicodin [hydrocodone-acetaminophen] Itching 03/23/2014    Family History  Problem Relation Age of Onset   Cancer Father        liver   Hypertension Brother    Heart disease Maternal Grandmother    Kidney disease Maternal Grandfather    Diabetes Maternal Grandfather    Kidney failure Brother     Social History   Socioeconomic History   Marital status: Married    Spouse name: Not on file   Number of children: Not on file   Years  of education: Not on file   Highest education level: Not on file  Occupational History   Not on file  Tobacco Use   Smoking status: Never   Smokeless tobacco: Never  Vaping Use   Vaping Use: Never used  Substance and Sexual Activity   Alcohol use: No   Drug use: No   Sexual activity: Not Currently  Other Topics Concern   Not on file  Social History Narrative   Not on file   Social Determinants of Health   Financial Resource Strain: Not on file  Food Insecurity: Not on file  Transportation Needs: Not on file  Physical Activity: Not on file  Stress: Not on file  Social Connections: Not on file  Intimate Partner Violence: Not on file    Review of Systems: Gen: Denies fever, chills, loss of  appetite, change in weight or weight loss CV: Denies chest pain, heart palpitations, syncope, edema  Resp: Denies shortness of breath with rest, cough, wheezing GI: Denies dysphagia or odynophagia. Denies vomiting blood, jaundice, and fecal incontinence.  GU : Denies urinary burning, urinary frequency, urinary incontinence.  MS: Denies joint pain,swelling, cramping Derm: Denies rash, itching, dry skin Psych: Denies depression, anxiety,confusion, or memory loss Heme: Denies bruising, bleeding, and enlarged lymph nodes.  Physical Exam: Vital signs in last 24 hours: Temp:  [97.5 F (36.4 C)-98.2 F (36.8 C)] 97.9 F (36.6 C) (08/10 0606) Pulse Rate:  [33-168] 94 (08/10 0608) Resp:  [16-18] 18 (08/10 0606) BP: (90-137)/(50-120) 98/65 (08/10 0658) SpO2:  [92 %-100 %] 96 % (08/10 [id number]) Weight:  [122.5 kg-124.2 kg] 124.2 kg (08/09 2156) Last BM Date: 07/04/21 General:   Alert,  Well-developed, well-nourished, pleasant and cooperative in NAD Head:  Normocephalic and atraumatic. Eyes:  Sclera clear, no icterus.   Conjunctiva pink. Mouth:  No deformity or lesions, dentition normal. Neck:  Supple; no masses or thyromegaly. Lungs:  Clear throughout to auscultation.   No wheezes, crackles,  or rhonchi. No acute distress. Heart:  Regular rate and rhythm; no murmurs, clicks, rubs,  or gallops. Abdomen:  Soft, nontender and nondistended. No masses, hepatosplenomegaly or hernias noted. Normal bowel sounds, without guarding, and without rebound.   Rectal:  Deferred until time of colonoscopy.   Msk:  Symmetrical without gross deformities. Normal posture. Pulses:  Normal pulses noted. Extremities: 3+ pitting edema, chronic Neurologic:  Alert and  oriented x4;  grossly normal neurologically. Skin:  Intact without significant lesions or rashes. Psych:  Alert and cooperative. Normal mood and affect.  Intake/Output from previous day: 08/09 0701 - 08/10 0700 In: -  Out: 300 [Urine:300] Intake/Output this shift: No intake/output data recorded.  Lab Results: Recent Labs    07/03/21 1342 07/04/21 0519  WBC 8.3 6.5  HGB 7.8* 7.8*  HCT 28.3* 28.9*  PLT 253 265   BMET Recent Labs    07/03/21 1342 07/04/21 0519  NA 135 136  K 3.8 3.7  CL 103 103  CO2 27 27  GLUCOSE 105* 83  BUN 13 11  CREATININE 0.73 0.65  CALCIUM 8.3* 8.4*   LFT Recent Labs    07/03/21 1342  PROT 5.6*  ALBUMIN 2.7*  AST 22  ALT 19  ALKPHOS 104  BILITOT 1.3*     Studies/Results: DG Abdomen 1 View  Result Date: 07/03/2021 CLINICAL DATA:  Constipation EXAM: ABDOMEN - 1 VIEW COMPARISON:  CT pelvis June 28, 2021 FINDINGS: The bowel gas pattern is normal. Moderate volume of formed stool throughout the colon. Degenerative changes bilateral hips. Lumbar spondylosis. IMPRESSION: Nonobstructive bowel gas pattern. Moderate volume of formed stool throughout the colon. Electronically Signed   By: Dahlia Bailiff MD   On: 07/03/2021 17:45    Impression: Lauren [name] is a 75 year old female with complicated pmh who presented to [place] ED on 8/9 with complaints of ongoing rectal pain and constipation, recent admission at [facility] revealed cecal thickening with concern of underlying cecal mass on CT  Abd/Pelvis. Patient was to undergo colonoscopy during admission but left AMA. Also noted to have recent development of anemia during admission at [facility]  Anemia: hemoglobin remains at 7.8, same as on admission no transfusions this admission thus far. Patient denies melena or hematochezia. FOBT negative in ED. Iron studies revealed TIBC 414, Ferritin 5.4, Iron 16, indicating IDA, likely due to possible cecal mass.  Constipation: has had two good sized BMs since admission, she reports rectal pain has resolved. She denies any abdominal pain, nausea or vomiting. Calcium 8.4. abdomen soft, non tender and with good bowel sounds, we will continue miralax BID at this time.   Cecal Mass: patient will need colonoscopy for further evaluation of possible cecal mass. Patient denies any history of previous colonoscopy or endoscopy. Will likely proceed with colonoscopy tomorrow as long as hemoglobin remains stable.   Plan: Plan for possible colonoscopy tomorrow, pending discussion with Dr. Laural Golden Will advance to clear liquid diet at this time NPO at midnight Continue miralax BID  Continue to trend H&H Continue PPI daily Transfuse PRBCs as necessary per hospitalist orders.  Continue to hold xarelto  Indications, risks and benefits of procedure discussed in detail with patient. Patient verbalized understanding and is in agreement to proceed with colonoscopy at this time.   Last Xarelto dose was at least 3 days ago, per patient.    LOS: 0 days    07/04/2021, 9:46 AM Kylor Valverde L. Alver Sorrow, MSN, APRN, AGNP-C Adult-Gerontology Nurse Practitioner [facility]

## 2021-07-04 NOTE — Consult Note (Signed)
Referring Provider: No ref. provider found Primary Care Physician:  Pcp, No Primary Gastroenterologist:  Dr. Laural Golden  Date of Admission: 07/03/21 Date of Consultation: 07/04/21  Reason for Consultation:  possible cecal mass, anemia  HPI:  Lauren [name] is a 75 y.o. year old female with pmh of a fib, morbid obesity, diastolic CHF, HTN, lymphedema, presenting with constipation and rectal pain for the past 2 days. Recent admitted at [facility] from 8/4-06/30/21 for constipation. During that admission, patient was noted to have hgb of 5.6. she was transfused 2 units of PRBCs. Her discharge hgb was 8. She had CT abd/pelvis which showed a cecal thickening raising concern of possible cecal mass with small adjacent lymph nodes. Patient was scheduled for colonoscopy by general surgery but she left AMA prior to procedure being done. Recent cardiac echo on 06/28/21 showed EG of 30%. Patient reports noncompliance with medications including torsemide, xarelto, diltiazem since her discharge from [facility] and even before hospitalization.   ED Course: patient presented to ED on 8/9 with c/o rectal pain and constipation, concerns for fecal impaction, taking miralax at home as well as mag citrate without results. She denied any rectal bleeding. She was hemodynamically stable, afebrile, noted to have a fib with RVR (given IV metoprolol which brought HR down to the 90s). WBC 8.3, hgb 7.8, plt 265,000, LFTs WNL. Iron sat and ferritin 4 and 5 respectively on 8/5. Found to be moderately impacted in ED, given dulcolax suppository followed by fleet enema. She was unable to retain fleet enema long enough for it to be helpful. On reexam, stool felt softer and patient felt less impacted, some stool was digitally disimpacted as well, however, patient continued to c/o rectal pain, KUB showed moderate colonic stool burden. She was given lidocaine jelly which improved rectal pain but did not alleviate it.   Consult: Patient reports she is  very tired today and has been over the past few days. States she has been having issues with constipation for about 1 week. She was taking 1/2 capful of miralax for a few days at home. She denies any abdominal pain, nausea or vomiting. Reports she had a BM last night and this morning. Reports they were fairly good sized and she feels much better this morning. Denies melena or hematochezia. She denies rectal pain currently, and states it has resolved since having BMs during admission. Patient is requesting to eat as she has not eaten in the past 3-4 days. Unsure of last dose of xarelto, reports atleast 3 days ago but could be up to 1 week ago.   Past Medical History:  Diagnosis Date   Acute cholecystitis 12/06/2012   Atrial fibrillation with RVR ([place]) 12/06/2012   Diverticulosis    Hypothyroidism    Obesity    Pancreatitis, acute [id number]   Periumbilical hernia     Past Surgical History:  Procedure Laterality Date   CHOLECYSTECTOMY N/A 03/25/2014   Procedure: LAPAROSCOPIC CHOLECYSTECTOMY WITH INTRAOPERATIVE CHOLANGIOGRAM;  Surgeon: Ralene Ok, MD;  Location: [place];  Service: General;  Laterality: N/A;    Prior to Admission medications   Medication Sig Start Date End Date Taking? Authorizing Provider  acetaminophen (TYLENOL) 500 MG tablet Take 1,000 mg by mouth every 6 (six) hours as needed.   Yes [provider]  levothyroxine (SYNTHROID) 100 MCG tablet Take 1 tablet (100 mcg total) by mouth daily. 11/10/19  Yes BranchAlphonse Guild, MD  magnesium citrate SOLN Take by mouth.   Yes [provider]  rivaroxaban (XARELTO) 20 MG TABS tablet Take 1 tablet (20 mg total) by mouth every other day. 04/26/20  Yes Branch, Alphonse Guild, MD  diltiazem (CARDIZEM) 30 MG tablet Take 30 mg by mouth daily. Patient not taking: No sig reported    [provider]  levothyroxine (SYNTHROID) 100 MCG tablet Take 1 tablet by mouth daily. Patient not taking: No sig reported 06/12/20    [provider]  potassium chloride SA (KLOR-CON) 20 MEQ tablet TAKE ONE (1) TABLET EACH DAY Patient not taking: No sig reported 09/20/20   Arnoldo Lenis, MD  torsemide (DEMADEX) 20 MG tablet Take 20 mg by mouth daily. Patient not taking: Reported on 07/03/2021    [provider]    Current Facility-Administered Medications  Medication Dose Route Frequency Provider Last Rate Last Admin   acetaminophen (TYLENOL) tablet 650 mg  650 mg Oral Q6H PRN Emokpae, Ejiroghene E, MD       Or   acetaminophen (TYLENOL) suppository 650 mg  650 mg Rectal Q6H PRN Emokpae, Ejiroghene E, MD       metoprolol tartrate (LOPRESSOR) injection 5 mg  5 mg Intravenous Q5 min PRN Emokpae, Ejiroghene E, MD       metoprolol tartrate (LOPRESSOR) tablet 25 mg  25 mg Oral BID Tat, David, MD       pantoprazole (PROTONIX) injection 40 mg  40 mg Intravenous Q24H Emokpae, Ejiroghene E, MD   40 mg at 07/03/21 2238   polyethylene glycol (MIRALAX / GLYCOLAX) packet 17 g  17 g Oral BID Emokpae, Ejiroghene E, MD   17 g at 07/04/21 0944   polyethylene glycol (MIRALAX / GLYCOLAX) packet 17 g  17 g Oral Daily PRN Emokpae, Ejiroghene E, MD       torsemide (DEMADEX) tablet 20 mg  20 mg Oral Daily Tat, David, MD        Allergies as of 07/03/2021 - Review Complete 07/03/2021  Allergen Reaction Noted   Codeine Itching 10/10/2012   Compazine [prochlorperazine edisylate]  10/10/2012   Prednisone Nausea And Vomiting 12/04/2012   Tetanus toxoids Other (See Comments) 07/06/2018   Vicodin [hydrocodone-acetaminophen] Itching 03/23/2014    Family History  Problem Relation Age of Onset   Cancer Father        liver   Hypertension Brother    Heart disease Maternal Grandmother    Kidney disease Maternal Grandfather    Diabetes Maternal Grandfather    Kidney failure Brother     Social History   Socioeconomic History   Marital status: Married    Spouse name: Not on file   Number of children: Not on file   Years  of education: Not on file   Highest education level: Not on file  Occupational History   Not on file  Tobacco Use   Smoking status: Never   Smokeless tobacco: Never  Vaping Use   Vaping Use: Never used  Substance and Sexual Activity   Alcohol use: No   Drug use: No   Sexual activity: Not Currently  Other Topics Concern   Not on file  Social History Narrative   Not on file   Social Determinants of Health   Financial Resource Strain: Not on file  Food Insecurity: Not on file  Transportation Needs: Not on file  Physical Activity: Not on file  Stress: Not on file  Social Connections: Not on file  Intimate Partner Violence: Not on file    Review of Systems: Gen: Denies fever, chills, loss of  appetite, change in weight or weight loss CV: Denies chest pain, heart palpitations, syncope, edema  Resp: Denies shortness of breath with rest, cough, wheezing GI: Denies dysphagia or odynophagia. Denies vomiting blood, jaundice, and fecal incontinence.  GU : Denies urinary burning, urinary frequency, urinary incontinence.  MS: Denies joint pain,swelling, cramping Derm: Denies rash, itching, dry skin Psych: Denies depression, anxiety,confusion, or memory loss Heme: Denies bruising, bleeding, and enlarged lymph nodes.  Physical Exam: Vital signs in last 24 hours: Temp:  [97.5 F (36.4 C)-98.2 F (36.8 C)] 97.9 F (36.6 C) (08/10 0606) Pulse Rate:  [33-168] 94 (08/10 0608) Resp:  [16-18] 18 (08/10 0606) BP: (90-137)/(50-120) 98/65 (08/10 0658) SpO2:  [92 %-100 %] 96 % (08/10 [id number]) Weight:  [122.5 kg-124.2 kg] 124.2 kg (08/09 2156) Last BM Date: 07/04/21 General:   Alert,  Well-developed, well-nourished, pleasant and cooperative in NAD Head:  Normocephalic and atraumatic. Eyes:  Sclera clear, no icterus.   Conjunctiva pink. Mouth:  No deformity or lesions, dentition normal. Neck:  Supple; no masses or thyromegaly. Lungs:  Clear throughout to auscultation.   No wheezes, crackles,  or rhonchi. No acute distress. Heart:  Regular rate and rhythm; no murmurs, clicks, rubs,  or gallops. Abdomen:  Soft, nontender and nondistended. No masses, hepatosplenomegaly or hernias noted. Normal bowel sounds, without guarding, and without rebound.   Rectal:  Deferred until time of colonoscopy.   Msk:  Symmetrical without gross deformities. Normal posture. Pulses:  Normal pulses noted. Extremities: 3+ pitting edema, chronic Neurologic:  Alert and  oriented x4;  grossly normal neurologically. Skin:  Intact without significant lesions or rashes. Psych:  Alert and cooperative. Normal mood and affect.  Intake/Output from previous day: 08/09 0701 - 08/10 0700 In: -  Out: 300 [Urine:300] Intake/Output this shift: No intake/output data recorded.  Lab Results: Recent Labs    07/03/21 1342 07/04/21 0519  WBC 8.3 6.5  HGB 7.8* 7.8*  HCT 28.3* 28.9*  PLT 253 265   BMET Recent Labs    07/03/21 1342 07/04/21 0519  NA 135 136  K 3.8 3.7  CL 103 103  CO2 27 27  GLUCOSE 105* 83  BUN 13 11  CREATININE 0.73 0.65  CALCIUM 8.3* 8.4*   LFT Recent Labs    07/03/21 1342  PROT 5.6*  ALBUMIN 2.7*  AST 22  ALT 19  ALKPHOS 104  BILITOT 1.3*     Studies/Results: DG Abdomen 1 View  Result Date: 07/03/2021 CLINICAL DATA:  Constipation EXAM: ABDOMEN - 1 VIEW COMPARISON:  CT pelvis June 28, 2021 FINDINGS: The bowel gas pattern is normal. Moderate volume of formed stool throughout the colon. Degenerative changes bilateral hips. Lumbar spondylosis. IMPRESSION: Nonobstructive bowel gas pattern. Moderate volume of formed stool throughout the colon. Electronically Signed   By: Dahlia Bailiff MD   On: 07/03/2021 17:45    Impression: nabeela roda is a 75 year old female with complicated pmh who presented to  ED on 8/9 with complaints of ongoing rectal pain and constipation, recent admission at [facility] revealed cecal thickening with concern of underlying cecal mass on CT  Abd/Pelvis. Patient was to undergo colonoscopy during admission but left AMA. Also noted to have recent development of anemia during admission at [facility]  Anemia: hemoglobin remains at 7.8, same as on admission no transfusions this admission thus far. Patient denies melena or hematochezia. FOBT negative in ED. Iron studies revealed TIBC 414, Ferritin 5.4, Iron 16, indicating IDA, likely due to possible cecal mass.  Constipation: has had two good sized BMs since admission, she reports rectal pain has resolved. She denies any abdominal pain, nausea or vomiting. Calcium 8.4. abdomen soft, non tender and with good bowel sounds, we will continue miralax BID at this time.   Cecal Mass: patient will need colonoscopy for further evaluation of possible cecal mass. Patient denies any history of previous colonoscopy or endoscopy. Will likely proceed with colonoscopy tomorrow as long as hemoglobin remains stable.   Plan: Plan for possible colonoscopy tomorrow, pending discussion with Dr. Laural Golden Will advance to clear liquid diet at this time NPO at midnight Continue miralax BID  Continue to trend H&H Continue PPI daily Transfuse PRBCs as necessary per hospitalist orders.  Continue to hold xarelto  Indications, risks and benefits of procedure discussed in detail with patient. Patient verbalized understanding and is in agreement to proceed with colonoscopy at this time.   Last Xarelto dose was at least 3 days ago, per patient.    LOS: 0 days    07/04/2021, 9:46 AM Clerance Umland L. Alver Sorrow, MSN, APRN, AGNP-C Adult-Gerontology Nurse Practitioner [facility]

## 2021-07-04 NOTE — Progress Notes (Signed)
PROGRESS NOTE  Lauren [name] [id number] DOB: [birth year] DOA: 07/03/2021 PCP: Pcp, No  Brief History:  75 year old female with a history of atrial fibrillation, morbid obesity, diastolic CHF, hypertension, lymphedema presenting with constipation and rectal pain of 2 day duration.  The patient was recently admitted to [facility] from 06/28/2021 to 06/30/2021 for constipation.  During that admission, the patient was noted to have a hemoglobin of 5.6.  She was transfused with 2 units PRBC.  Her discharge hemoglobin was 8.  She had a CT of the abdomen and pelvis which showed cecal thickening raising questions of a possible cecal mass with small adjacent lymph nodes.  There is also skin thickening over her pannus.  The patient was given cathartics and ultimately had a bowel movement.  The patient was scheduled to have colonoscopy performed by general surgery, but she left AGAINST MEDICAL ADVICE prior to this being done.  An echocardiogram was performed on 06/28/2021 which showed EF 30%, mild to moderate MR, and normal RV. The patient states that she has not been fully compliant with all her prescription medications.  She has not taken her prescription medicines including her torsemide, Xarelto, and diltiazem since her discharge from [facility].  Even prior to that, patient states that she has not been fully compliant with her medications.  She states that she does not use any cathartics at home.  She denies any hematochezia, melena, hematemesis. In the emergency department, patient was afebrile and hemodynamically stable.  She was noted to have atrial fibrillation with RVR.  Oxygen saturation was 96% on room air.  BMP was unremarkable.  WBC 8.3, hemoglobin 7.8, platelets 265,000.  LFTs were unremarkable.  Assessment/Plan: Atrial fibrillation with RVR -Now rate controlled -Continue metoprolol heart rate -06/29/21 Echo EF 30%, mild-mod MR -Soft blood pressures limit titration of her chronotropic  medications -Personally reviewed EKG--atrial fibrillation, nonspecific T wave change  Abnormal CT abdomen/possible cecal mass -GI consult -06/28/2021 CT abdomen as discussed above  Atrial fibrillation, type unspecified -Currently rate controlled -Holding rivaroxaban in anticipation of GI work-up  Iron deficiency anemia -06/29/2021 iron saturation 4, ferritin 5 -Holding iron supplementation until GI work-up was done  Morbid obesity -BMI 50.08 -Lifestyle modification  Hypothyroidism -Continue Synthroid  Lymphedema -Restart torsemide  Chronic systolic and Diastolic CHF -clinically euvolemic -restart home torsemide     Status is: Observation  The patient will require care spanning > 2 midnights and should be moved to inpatient because: Inpatient level of care appropriate due to severity of illness  Dispo: The patient is from: Home              Anticipated d/c is to: Home              Patient currently is not medically stable to d/c.   Difficult to place patient No        Family Communication:  no Family at bedside  Consultants:  GI  Code Status:  FULL   DVT Prophylaxis: Xarelto on hold   Procedures: As Listed in Progress Note Above  Antibiotics: None      Subjective: Patient states that she had a bowel movement last evening.  She is feeling better.  She denies any fever, chills, chest pain, shortness breath, nausea, vomiting, diarrhea, abdominal pain.  Objective: Vitals:   07/04/21 0606 07/04/21 0608 07/04/21 0614 07/04/21 0658  BP: (!) 90/57  (!) 91/59 98/65  Pulse: (!) 33 94    Resp: 18  Temp: 97.9 F (36.6 C)     TempSrc: Oral     SpO2: 100% 96%    Weight:      Height:        Intake/Output Summary (Last 24 hours) at 07/04/2021 0834 Last data filed at 07/04/2021 0500 Gross per 24 hour  Intake --  Output 300 ml  Net -300 ml   Weight change:  Exam:  General:  Pt is alert, follows commands appropriately, not in acute  distress HEENT: No icterus, No thrush, No neck mass, [place]/AT Cardiovascular: RRR, S1/S2, no rubs, no gallops Respiratory: CTA bilaterally, no wheezing, no crackles, no rhonchi Abdomen: Soft/+BS, non tender, non distended, no guarding Extremities: Nonpitting edema, No lymphangitis, No petechiae, No rashes, no synovitis   Data Reviewed: I have personally reviewed following labs and imaging studies Basic Metabolic Panel: Recent Labs  Lab 07/03/21 1342 07/04/21 0519  NA 135 136  K 3.8 3.7  CL 103 103  CO2 27 27  GLUCOSE 105* 83  BUN 13 11  CREATININE 0.73 0.65  CALCIUM 8.3* 8.4*   Liver Function Tests: Recent Labs  Lab 07/03/21 1342  AST 22  ALT 19  ALKPHOS 104  BILITOT 1.3*  PROT 5.6*  ALBUMIN 2.7*   No results for input(s): LIPASE, AMYLASE in the last 168 hours. No results for input(s): AMMONIA in the last 168 hours. Coagulation Profile: No results for input(s): INR, PROTIME in the last 168 hours. CBC: Recent Labs  Lab 07/03/21 1342 07/04/21 0519  WBC 8.3 6.5  NEUTROABS 5.9  --   HGB 7.8* 7.8*  HCT 28.3* 28.9*  MCV 75.3* 75.9*  PLT 253 265   Cardiac Enzymes: No results for input(s): CKTOTAL, CKMB, CKMBINDEX, TROPONINI in the last 168 hours. BNP: Invalid input(s): POCBNP CBG: Recent Labs  Lab 07/04/21 0755  GLUCAP 85   HbA1C: No results for input(s): HGBA1C in the last 72 hours. Urine analysis:    Component Value Date/Time   COLORURINE YELLOW 03/26/2014 1346   APPEARANCEUR CLEAR 03/26/2014 1346   LABSPEC 1.011 03/26/2014 1346   PHURINE 5.0 03/26/2014 1346   GLUCOSEU NEGATIVE 03/26/2014 1346   HGBUR MODERATE (A) 03/26/2014 1346   BILIRUBINUR NEGATIVE 03/26/2014 1346   KETONESUR NEGATIVE 03/26/2014 1346   PROTEINUR NEGATIVE 03/26/2014 1346   UROBILINOGEN 0.2 03/26/2014 1346   NITRITE NEGATIVE 03/26/2014 1346   LEUKOCYTESUR NEGATIVE 03/26/2014 1346   Sepsis Labs: '@LABRCNTIP'$ (procalcitonin:4,lacticidven:4) )No results found for this or any  previous visit (from the past 240 hour(s)).   Scheduled Meds:  metoprolol tartrate  50 mg Oral BID   pantoprazole (PROTONIX) IV  40 mg Intravenous Q24H   polyethylene glycol  17 g Oral BID   Continuous Infusions:  Procedures/Studies: DG Abdomen 1 View  Result Date: 07/03/2021 CLINICAL DATA:  Constipation EXAM: ABDOMEN - 1 VIEW COMPARISON:  CT pelvis June 28, 2021 FINDINGS: The bowel gas pattern is normal. Moderate volume of formed stool throughout the colon. Degenerative changes bilateral hips. Lumbar spondylosis. IMPRESSION: Nonobstructive bowel gas pattern. Moderate volume of formed stool throughout the colon. Electronically Signed   By: Dahlia Bailiff MD   On: 07/03/2021 17:45    Orson Eva, DO  [facility]  If 7PM-7AM, please contact night-coverage [email or web address] Password [facility] 07/04/2021, 8:34 AM   LOS: 0 days

## 2021-07-04 NOTE — Care Management Obs Status (Signed)
[place] NOTIFICATION   Patient Details  Name: Lauren [name] MRN: [id number] Date of Birth: August 08, 1946   Medicare Observation Status Notification Given:  Yes    Tommy Medal 07/04/2021, 2:01 PM

## 2021-07-05 ENCOUNTER — Telehealth: Payer: Medicare HMO | Admitting: Cardiology

## 2021-07-05 ENCOUNTER — Encounter (HOSPITAL_COMMUNITY): Payer: Self-pay | Admitting: Internal Medicine

## 2021-07-05 ENCOUNTER — Observation Stay (HOSPITAL_COMMUNITY): Payer: Medicare HMO | Admitting: Anesthesiology

## 2021-07-05 ENCOUNTER — Encounter (HOSPITAL_COMMUNITY)
Admission: EM | Disposition: A | Discharge status: Skilled Nursing Facility | Payer: Self-pay | Source: Home / Self Care | Attending: Internal Medicine

## 2021-07-05 DIAGNOSIS — K222 Esophageal obstruction: Secondary | ICD-10-CM | POA: Diagnosis not present

## 2021-07-05 DIAGNOSIS — K5669 Other partial intestinal obstruction: Secondary | ICD-10-CM | POA: Diagnosis not present

## 2021-07-05 DIAGNOSIS — K635 Polyp of colon: Secondary | ICD-10-CM | POA: Diagnosis not present

## 2021-07-05 DIAGNOSIS — I5042 Chronic combined systolic (congestive) and diastolic (congestive) heart failure: Secondary | ICD-10-CM | POA: Diagnosis not present

## 2021-07-05 DIAGNOSIS — K6389 Other specified diseases of intestine: Secondary | ICD-10-CM

## 2021-07-05 DIAGNOSIS — I890 Lymphedema, not elsewhere classified: Secondary | ICD-10-CM | POA: Diagnosis not present

## 2021-07-05 DIAGNOSIS — D509 Iron deficiency anemia, unspecified: Secondary | ICD-10-CM | POA: Diagnosis not present

## 2021-07-05 DIAGNOSIS — D490 Neoplasm of unspecified behavior of digestive system: Secondary | ICD-10-CM

## 2021-07-05 DIAGNOSIS — I4891 Unspecified atrial fibrillation: Secondary | ICD-10-CM | POA: Diagnosis not present

## 2021-07-05 HISTORY — PX: ESOPHAGOGASTRODUODENOSCOPY (EGD) WITH PROPOFOL: SHX5813

## 2021-07-05 HISTORY — PX: BIOPSY: SHX5522

## 2021-07-05 HISTORY — PX: COLONOSCOPY WITH PROPOFOL: SHX5780

## 2021-07-05 HISTORY — PX: POLYPECTOMY: SHX5525

## 2021-07-05 LAB — CBC
HCT: 27.9 % — ABNORMAL LOW (ref 36.0–46.0)
Hemoglobin: 7.6 g/dL — ABNORMAL LOW (ref 12.0–15.0)
MCH: 20.4 pg — ABNORMAL LOW (ref 26.0–34.0)
MCHC: 27.2 g/dL — ABNORMAL LOW (ref 30.0–36.0)
MCV: 75 fL — ABNORMAL LOW (ref 80.0–100.0)
Platelets: 249 10*3/uL (ref 150–400)
RBC: 3.72 MIL/uL — ABNORMAL LOW (ref 3.87–5.11)
RDW: 23.9 % — ABNORMAL HIGH (ref 11.5–15.5)
WBC: 6.6 10*3/uL (ref 4.0–10.5)
nRBC: 0 % (ref 0.0–0.2)

## 2021-07-05 LAB — BASIC METABOLIC PANEL
Anion gap: 6 (ref 5–15)
BUN: 8 mg/dL (ref 8–23)
CO2: 24 mmol/L (ref 22–32)
Calcium: 8.3 mg/dL — ABNORMAL LOW (ref 8.9–10.3)
Chloride: 105 mmol/L (ref 98–111)
Creatinine, Ser: 0.66 mg/dL (ref 0.44–1.00)
GFR, Estimated: >60 mL/min (ref >60)
Glucose, Bld: 75 mg/dL (ref 70–99)
Potassium: 3.7 mmol/L (ref 3.5–5.1)
Sodium: 135 mmol/L (ref 135–145)

## 2021-07-05 LAB — GLUCOSE, CAPILLARY: Glucose-Capillary: 78 mg/dL (ref 70–99)

## 2021-07-05 LAB — ABO/RH: ABO/RH(D): O POS

## 2021-07-05 LAB — SURGICAL PCR SCREEN
MRSA, PCR: NEGATIVE
Staphylococcus aureus: NEGATIVE

## 2021-07-05 LAB — PREPARE RBC (CROSSMATCH)

## 2021-07-05 SURGERY — COLONOSCOPY WITH PROPOFOL
Anesthesia: General

## 2021-07-05 MED ORDER — LORAZEPAM 2 MG/ML IJ SOLN: 0.5000 mg | INTRAMUSCULAR | Status: DC | PRN

## 2021-07-05 MED ORDER — NEOMYCIN SULFATE 500 MG PO TABS
1000.0000 mg | ORAL_TABLET | ORAL | Status: DC
  Administered 2021-07-05 (×2): 1000 mg via ORAL
  Filled 2021-07-05 (×2): qty 2

## 2021-07-05 MED ORDER — LIDOCAINE HCL (CARDIAC) PF 100 MG/5ML IV SOSY
PREFILLED_SYRINGE | INTRAVENOUS | Status: DC | PRN
  Administered 2021-07-05: 50 mg via INTRAVENOUS

## 2021-07-05 MED ORDER — METRONIDAZOLE 500 MG/100ML IV SOLN
500.0000 mg | INTRAVENOUS | Status: AC
Start: 2021-07-05 — End: 2021-07-06
  Administered 2021-07-05 – 2021-07-06 (×3): 500 mg via INTRAVENOUS
  Filled 2021-07-05 (×3): qty 100

## 2021-07-05 MED ORDER — PROPOFOL 10 MG/ML IV BOLUS
INTRAVENOUS | Status: DC | PRN
  Administered 2021-07-05: 80 mg via INTRAVENOUS

## 2021-07-05 MED ORDER — SODIUM CHLORIDE 0.9 % IV SOLN: INTRAVENOUS | Status: DC

## 2021-07-05 MED ORDER — DIGOXIN 125 MCG PO TABS
0.2500 mg | ORAL_TABLET | Freq: Every day | ORAL | Status: AC
  Administered 2021-07-05 – 2021-07-07 (×2): 0.25 mg via ORAL
  Filled 2021-07-05 (×2): qty 2

## 2021-07-05 MED ORDER — LACTATED RINGERS IV SOLN: INTRAVENOUS | Status: DC

## 2021-07-05 MED ORDER — ALVIMOPAN 12 MG PO CAPS: 12.0000 mg | ORAL_CAPSULE | ORAL | Status: DC

## 2021-07-05 MED ORDER — LACTATED RINGERS IV SOLN: INTRAVENOUS | Status: DC | PRN

## 2021-07-05 MED ORDER — METOPROLOL SUCCINATE ER 25 MG PO TB24
25.0000 mg | ORAL_TABLET | Freq: Two times a day (BID) | ORAL | Status: DC
  Administered 2021-07-05 – 2021-07-08 (×5): 25 mg via ORAL
  Filled 2021-07-05 (×5): qty 1

## 2021-07-05 MED ORDER — ENOXAPARIN SODIUM 40 MG/0.4ML IJ SOSY
40.0000 mg | PREFILLED_SYRINGE | Freq: Once | INTRAMUSCULAR | Status: AC
  Administered 2021-07-06: 40 mg via SUBCUTANEOUS
  Filled 2021-07-05: qty 0.4

## 2021-07-05 MED ORDER — LORAZEPAM 2 MG/ML IJ SOLN
0.5000 mg | Freq: Four times a day (QID) | INTRAMUSCULAR | Status: DC | PRN
  Administered 2021-07-05 – 2021-07-07 (×5): 0.5 mg via INTRAVENOUS
  Filled 2021-07-05 (×5): qty 1

## 2021-07-05 MED ORDER — PROPOFOL 500 MG/50ML IV EMUL
INTRAVENOUS | Status: DC | PRN
  Administered 2021-07-05: 125 ug/kg/min via INTRAVENOUS

## 2021-07-05 MED ORDER — ONDANSETRON HCL 4 MG/2ML IJ SOLN
INTRAMUSCULAR | Status: DC | PRN
  Administered 2021-07-05: 4 mg via INTRAVENOUS

## 2021-07-05 MED ORDER — STERILE WATER FOR IRRIGATION IR SOLN
Status: DC | PRN
  Administered 2021-07-05: 100 mL

## 2021-07-05 MED ORDER — PHENYLEPHRINE HCL (PRESSORS) 10 MG/ML IV SOLN
INTRAVENOUS | Status: DC | PRN
  Administered 2021-07-05: 80 ug via INTRAVENOUS
  Administered 2021-07-05: 120 ug via INTRAVENOUS
  Administered 2021-07-05 (×3): 80 ug via INTRAVENOUS

## 2021-07-05 MED ORDER — MUPIROCIN 2 % EX OINT
1.0000 "application " | TOPICAL_OINTMENT | Freq: Two times a day (BID) | CUTANEOUS | Status: AC
  Administered 2021-07-05 – 2021-07-09 (×8): 1 via NASAL
  Filled 2021-07-05 (×2): qty 22

## 2021-07-05 MED ORDER — SODIUM CHLORIDE 0.9 % IV SOLN
1.0000 g | INTRAVENOUS | Status: AC
  Administered 2021-07-06: 1 g via INTRAVENOUS
  Filled 2021-07-05: qty 1

## 2021-07-05 MED ORDER — CHLORHEXIDINE GLUCONATE CLOTH 2 % EX PADS: 6.0000 | MEDICATED_PAD | Freq: Once | CUTANEOUS | Status: AC

## 2021-07-05 MED ORDER — CHLORHEXIDINE GLUCONATE CLOTH 2 % EX PADS
6.0000 | MEDICATED_PAD | Freq: Once | CUTANEOUS | Status: AC
  Administered 2021-07-05: 6 via TOPICAL

## 2021-07-05 NOTE — Addendum Note (Signed)
Addendum  created 07/05/21 1206 by Orlie Dakin, CRNA   Clinical Note Signed, Review and Sign - Signed, SmartForm saved

## 2021-07-05 NOTE — Consult Note (Addendum)
Cardiology Consultation:   Patient ID: Lauren [name] MRN: [id number]; DOB: 21-Apr-1946  Admit date: 07/03/2021 Date of Consult: 07/05/2021  PCP:  [name] No   [facility] Providers Cardiologist:  Carlyle Dolly, MD   {    Patient Profile:   Lauren [name] is a 75 y.o. female with a hx of chronic atrial fibrillation of  who is being seen 07/05/2021 for the evaluation of presurgical clearance at the request of hospitalist.  History of Present Illness:   Lauren [name] 75 year old woman with approximate 5-year history of atrial fibrillation.  She has been on medical therapy including anticoagulation until recently at which time because of depression she stopped her medications.  (Grieving for her deceased husband). She noted increased shortness of breath and fatigue as well as lack of normal BMs and was hospitalized at [facility] and found to be severely anemic.  She was transfused but subsequent signed out AMA as she "did not like the care" at that institution. She has had some bowel difficulties for quite some time but they have become worse recently.  Has not noticed right rectal bleeding or melena. She has noticed increasing heart rate attributed this to her A. fib also attributed to her fatigue.  No chest discomfort.  Mild increase in chronic disability but admits that she has not been very active sickly for some time.  Legs have been chronically large with the swelling coming and going.  Some minor dizziness but no presyncope/syncope. As noted previously she has been less than pliant with her medications recently probably due to depression over the previous passing of her husband.  Past Medical History:  Diagnosis Date   Acute cholecystitis 12/06/2012   Atrial fibrillation with RVR ([place]) 12/06/2012   Diverticulosis    Hypothyroidism    Obesity    Pancreatitis, acute [id number]   Periumbilical hernia     Past Surgical History:  Procedure Laterality Date   CHOLECYSTECTOMY N/A 03/25/2014    Procedure: LAPAROSCOPIC CHOLECYSTECTOMY WITH INTRAOPERATIVE CHOLANGIOGRAM;  Surgeon: Ralene Ok, MD;  Location: [place];  Service: General;  Laterality: N/A;       Inpatient Medications: Scheduled Meds:  metoprolol tartrate  25 mg Oral BID   pantoprazole (PROTONIX) IV  40 mg Intravenous Q24H   torsemide  20 mg Oral Daily   Continuous Infusions:  PRN Meds: acetaminophen **OR** acetaminophen, metoprolol tartrate  Allergies:    Allergies  Allergen Reactions   Codeine Itching   Compazine [Prochlorperazine Edisylate]    Prednisone Nausea And Vomiting   Tetanus Toxoids Other (See Comments)    Knot like "hen egg" came up    Vicodin [Hydrocodone-Acetaminophen] Itching    Social History:   Social History   Socioeconomic History   Marital status: Married    Spouse name: Not on file   Number of children: Not on file   Years of education: Not on file   Highest education level: Not on file  Occupational History   Not on file  Tobacco Use   Smoking status: Never   Smokeless tobacco: Never  Vaping Use   Vaping Use: Never used  Substance and Sexual Activity   Alcohol use: No   Drug use: No   Sexual activity: Not Currently  Other Topics Concern   Not on file  Social History Narrative   Not on file   Social Determinants of Health   Financial Resource Strain: Not on file  Food Insecurity: Not on file  Transportation Needs: Not on file  Physical  Activity: Not on file  Stress: Not on file  Social Connections: Not on file  Intimate Partner Violence: Not on file    Family History:   Father had diabetes Family History  Problem Relation Age of Onset   Cancer Father        liver   Hypertension Brother    Heart disease Maternal Grandmother    Kidney disease Maternal Grandfather    Diabetes Maternal Grandfather    Kidney failure Brother      ROS:  Please see the history of present illness.  Increasing dyspnea and work of breathing recently. GI issues with  decreased in frequency and quantity of bowel movements noted above Urinary stress incontinence Arthritic complaints particularly in her hands. Large legs but the at times but they are smaller and at times larger Tendency for pagophagia and recent month.  All other ROS reviewed and negative.     Physical Exam/Data:   Vitals:   07/05/21 1030 07/05/21 1045 07/05/21 1100 07/05/21 1115  BP: 99/81 (!) 123/106 120/86 120/66  Pulse: (!) 104 (!) 103 (!) 127   Resp: (!) '23 20 12   '$ Temp:      TempSrc:      SpO2: 100% 100% 100% 98%  Weight:      Height:        Intake/Output Summary (Last 24 hours) at 07/05/2021 1204 Last data filed at 07/05/2021 1100 Gross per 24 hour  Intake 3350 ml  Output 0 ml  Net 3350 ml   Last 3 Weights 07/03/2021 07/03/2021 03/10/2020  Weight (lbs) 273 lb 13 oz 270 lb 270 lb  Weight (kg) 124.2 kg 122.471 kg 122.471 kg     Body mass index is 50.08 kg/m.  General:  Well nourished, well developed, in no acute distress eating lunch when I entered the room HEENT: normal Lymph: no adenopathy Neck: no JVD Endocrine:  No thryomegaly Vascular: No carotid bruits; FA pulses 2+ bilaterally without bruits  Cardiac:  normal S1, S2; RRR; no murmur no obvious gallop. Lungs:  clear to auscultation bilaterally, no wheezing, rhonchi or rales  Abd: soft, nontender, no hepatomegaly  Ext: Very large lower extremities bilaterally.  There are some edema in the feet to the ankles with the rest the legs are massive due to chronic lymphedema Musculoskeletal:  No deformities, BUE and BLE strength normal and equal Skin: warm and dry .  Chronic skin changes in her legs Neuro: No focal deficits.  Speech pattern seems normal Psych:  Normal affect, seems a bit anxious  EKG:  The EKG was personally reviewed and demonstrates: Atrial fibrillation with rapid ventricular sponsor with mild nonspecific ST-T changes.  Telemetry:  Telemetry was personally reviewed and demonstrates: Atrial fibrillation  at times with rapid rate  Relevant CV Studies: Echo done at [facility]   Summary  1. Technically difficult study.  2. The left ventricle is mildly to moderately dilated in size with normal wall thickness.  3. The left ventricular systolic function is moderately to severely decreased, LVEF is visually estimated at 30%.  4. There is mild to moderate mitral valve regurgitation.  5. The left atrium is severely dilated in size.  6. The right ventricle is normal in size, with normal systolic function.  7. There is no pulmonary hypertension, estimated pulmonary artery systolic pressure is 34 mmHg.  8. The right atrium is mildly dilated  in size.   Left Ventricle  The left ventricle is mildly to moderately dilated in size with normal wall  thickness.  The left ventricular systolic function is moderately to severely decreased, LVEF is visually estimated at 30%.  The left ventricular diastolic function is indeterminate.  Right Ventricle  The right ventricle is normal in size, with normal systolic function.   Left Atrium  The left atrium is severely dilated in size.  Right Atrium  The right atrium is mildly dilated  in size.   Aortic Valve  The aortic valve is trileaflet with mildly thickened leaflets with normal excursion.  There is trivial aortic regurgitation.  There is no evidence of a significant transvalvular gradient.  Mild aortic valve sclerosis without stenosis.  Pulmonic Valve  The pulmonic valve is normal.  There is trivial pulmonic regurgitation.  There is no evidence of a significant transvalvular gradient.  Mitral Valve  The mitral valve leaflets are mildly thickened with mildly reduced leaflet mobility.  Mitral annular calcification is present.  There is mild to moderate mitral valve regurgitation.  There is no mitral stenosis.  Posterior leaflet is restricted in motion.  Tricuspid Valve  The tricuspid valve leaflets are normal, with normal leaflet mobility.   There is mild tricuspid regurgitation.  There is no pulmonary hypertension, estimated pulmonary artery systolic pressure is 34 mmHg.   Other Findings  Rhythm: Atrial Fibrillation.  Pericardium/Pleural  There is no pericardial effusion.  Inferior Vena Cava  IVC size and inspiratory change suggest elevated right atrial pressure. (10-20 mmHg).  Aorta  The aorta is normal in size in the visualized segments.  Laboratory Data:  High Sensitivity Troponin:  No results for input(s): TROPONINIHS in the last 720 hours.   Chemistry Recent Labs  Lab 07/03/21 1342 07/04/21 0519 07/05/21 0549  NA 135 136 135  K 3.8 3.7 3.7  CL 103 103 105  CO2 '27 27 24  '$ GLUCOSE 105* 83 75  BUN '13 11 8  '$ CREATININE 0.73 0.65 0.66  CALCIUM 8.3* 8.4* 8.3*  GFRNONAA >60 >60 >60  ANIONGAP '5 6 6    '$ Recent Labs  Lab 07/03/21 1342  PROT 5.6*  ALBUMIN 2.7*  AST 22  ALT 19  ALKPHOS 104  BILITOT 1.3*   Hematology Recent Labs  Lab 07/03/21 1342 07/04/21 0519 07/05/21 0549  WBC 8.3 6.5 6.6  RBC 3.76* 3.81* 3.72*  HGB 7.8* 7.8* 7.6*  HCT 28.3* 28.9* 27.9*  MCV 75.3* 75.9* 75.0*  MCH 20.7* 20.5* 20.4*  MCHC 27.6* 27.0* 27.2*  RDW 23.9* 24.0* 23.9*  PLT 253 265 249   BNPNo results for input(s): BNP, PROBNP in the last 168 hours.  DDimer No results for input(s): DDIMER in the last 168 hours.   Radiology/Studies:  DG Abdomen 1 View  Result Date: 07/03/2021 CLINICAL DATA:  Constipation EXAM: ABDOMEN - 1 VIEW COMPARISON:  CT pelvis June 28, 2021 FINDINGS: The bowel gas pattern is normal. Moderate volume of formed stool throughout the colon. Degenerative changes bilateral hips. Lumbar spondylosis. IMPRESSION: Nonobstructive bowel gas pattern. Moderate volume of formed stool throughout the colon. Electronically Signed   By: Dahlia Bailiff MD   On: 07/03/2021 17:45     Assessment and Plan:   Chronic atrial fibrillation          recent issues with rate control due to noncompliance as well as her  significant anemia initially presented with a hemoglobin just over 5 for which she received a transfusion with current hemoglobin 7.6 2.  Left ventricular systolic dysfunction, significant suspect in part related to lack of medication with inadequate ventricular rate control. 3.  Cecal mass with significant iron deficiency anemia 4.  Chronic lymphedema 5  Thyroid Disease- euthyroid on replacement (labs at [facility])  Recommendation: I suspect her significant left ventricular systolic dysfunction is related to her recent anemia, noncompliance with medication and rapid atrial fibrillation.  Clinically I cannot tell there was significant LV dysfunction in terms of gallop congestive heart failure on exam etc. She is at mild to moderate surgical risk but I see no alternative to surgical therapy of her partially obstructing cecal mass. Rate control with metoprolol succinate and possibly digitalis if needed should be continued.  (I chose metoprolol succinate as being preferred for treatment of left ventricular systolic dysfunction over tartrate form).  Eventual use of ACE/ARB and later possible switch to [facility] if her significant left ventricular systolic function persists (hopefully transient).  Addition of spironolactone mild diuretic but 1 likely to preserve potassium and be helpful with LV dysfunction would be appropriate. Anticoagulation as appropriate with her recent GI evaluation.  If GI says okay cannot start on intravenous heparin for upcoming colon surgery.  If GI feels anticoagulation is too high risk appropriate and this should be noted. Continue on potent diuretic.  Iron infusion while in hospital and then oral iron replacement therapy. Continue thyroid dosing to maintain euthyroid state. Surgically we will need to prevent volume overload postop N/V status and are fluid administration. Long-term follow-up and repeat echo etc. in the future see if some return of left regular systolic function has  occurred. Dietary education regarding low-sodium heart healthy diet and social service visit regarding future follow-up to prevent her from stopping medications will likely be helpful.       For questions or updates, please contact [place] Please consult [email or web address] for contact info under    Signed, Abel Presto, MD  07/05/2021 12:04 PM

## 2021-07-05 NOTE — Consult Note (Signed)
Reason for Consult: Near obstructing ascending colon mass Referring Physician: Dr. Carles Collet, Lauren [name] is an 75 y.o. female.  HPI: Patient is a 75 year old white female who underwent a colonoscopy today for work-up of an abnormal CT scan of the pelvis and anemia.  At the time of colonoscopy, she was found to have a near obstructing ascending colon mass in the cecum.  It could not be fully removed endoscopically.  Other smaller polyps were removed from the colon but did not look malignant in nature.  She has a history of atrial fibrillation with rapid ventricular response, though her rate is now under control.  She denies any history of rectal bleeding.  She denies any family history of colon cancer.  She has been purposely trying to lose weight.  Past Medical History:  Diagnosis Date   Acute cholecystitis 12/06/2012   Atrial fibrillation with RVR ([place]) 12/06/2012   Diverticulosis    Hypothyroidism    Obesity    Pancreatitis, acute [id number]   Periumbilical hernia     Past Surgical History:  Procedure Laterality Date   CHOLECYSTECTOMY N/A 03/25/2014   Procedure: LAPAROSCOPIC CHOLECYSTECTOMY WITH INTRAOPERATIVE CHOLANGIOGRAM;  Surgeon: Ralene Ok, MD;  Location: [facility];  Service: General;  Laterality: N/A;    Family History  Problem Relation Age of Onset   Cancer Father        liver   Hypertension Brother    Heart disease Maternal Grandmother    Kidney disease Maternal Grandfather    Diabetes Maternal Grandfather    Kidney failure Brother     Social History:  reports that she has never smoked. She has never used smokeless tobacco. She reports that she does not drink alcohol and does not use drugs.  Allergies:  Allergies  Allergen Reactions   Codeine Itching   Compazine [Prochlorperazine Edisylate]    Prednisone Nausea And Vomiting   Tetanus Toxoids Other (See Comments)    Knot like "hen egg" came up    Vicodin [Hydrocodone-Acetaminophen] Itching    Medications: I  have reviewed the patient's current medications. Prior to Admission:  Medications Prior to Admission  Medication Sig Dispense Refill Last Dose   acetaminophen (TYLENOL) 500 MG tablet Take 1,000 mg by mouth every 6 (six) hours as needed.   Past Week   levothyroxine (SYNTHROID) 100 MCG tablet Take 1 tablet (100 mcg total) by mouth daily. 90 tablet 0 Past Week   magnesium citrate SOLN Take by mouth.   Past Week   rivaroxaban (XARELTO) 20 MG TABS tablet Take 1 tablet (20 mg total) by mouth every other day.   Past Week   diltiazem (CARDIZEM) 30 MG tablet Take 30 mg by mouth daily. (Patient not taking: No sig reported)   Not Taking   levothyroxine (SYNTHROID) 100 MCG tablet Take 1 tablet by mouth daily. (Patient not taking: No sig reported)   Not Taking   potassium chloride SA (KLOR-CON) 20 MEQ tablet TAKE ONE (1) TABLET EACH DAY (Patient not taking: No sig reported) 90 tablet 1 Not Taking   torsemide (DEMADEX) 20 MG tablet Take 20 mg by mouth daily. (Patient not taking: Reported on 07/03/2021)   Not Taking    Results for orders placed or performed during the hospital encounter of 07/03/21 (from the past 48 hour(s))  SARS CORONAVIRUS 2 (TAT 6-24 HRS) Nasopharyngeal Nasopharyngeal Swab     Status: None   Collection Time: 07/03/21  7:45 PM   Specimen: Nasopharyngeal Swab  Result Value Ref Range  SARS Coronavirus 2 NEGATIVE NEGATIVE    Comment: (NOTE) SARS-CoV-2 target nucleic acids are NOT DETECTED.  The SARS-CoV-2 RNA is generally detectable in upper and lower respiratory specimens during the acute phase of infection. Negative results do not preclude SARS-CoV-2 infection, do not rule out co-infections with other pathogens, and should not be used as the sole basis for treatment or other patient management decisions. Negative results must be combined with clinical observations, patient history, and epidemiological information. The expected result is Negative.  Fact Sheet for  Patients: SugarRoll.be  Fact Sheet for Healthcare Providers: https://www.woods-mathews.com/  This test is not yet approved or cleared by the [place] FDA and  has been authorized for detection and/or diagnosis of SARS-CoV-2 by FDA under an Emergency Use Authorization (EUA). This EUA will remain  in effect (meaning this test can be used) for the duration of the COVID-19 declaration under Se ction 564(b)(1) of the Act, 21 U.S.C. section 360bbb-3(b)(1), unless the authorization is terminated or revoked sooner.  Performed at [facility], [place] [address], [place], [place] [id number]   Basic metabolic panel     Status: Abnormal   Collection Time: 07/04/21  5:19 AM  Result Value Ref Range   Sodium 136 135 - 145 mmol/L   Potassium 3.7 3.5 - 5.1 mmol/L   Chloride 103 98 - 111 mmol/L   CO2 27 22 - 32 mmol/L   Glucose, Bld 83 70 - 99 mg/dL    Comment: Glucose reference range applies only to samples taken after fasting for at least 8 hours.   BUN 11 8 - 23 mg/dL   Creatinine, Ser 0.65 0.44 - 1.00 mg/dL   Calcium 8.4 (L) 8.9 - 10.3 mg/dL   GFR, Estimated >60 >60 mL/min    Comment: (NOTE) Calculated using the CKD-EPI Creatinine Equation (2021)    Anion gap 6 5 - 15    Comment: Performed at [facility], [address] [place], [place] [address]  CBC     Status: Abnormal   Collection Time: 07/04/21  5:19 AM  Result Value Ref Range   WBC 6.5 4.0 - 10.5 K/uL   RBC 3.81 (L) 3.87 - 5.11 MIL/uL   Hemoglobin 7.8 (L) 12.0 - 15.0 g/dL    Comment: Reticulocyte Hemoglobin testing may be clinically indicated, consider ordering this additional test [id number]    HCT 28.9 (L) 36.0 - 46.0 %   MCV 75.9 (L) 80.0 - 100.0 fL   MCH 20.5 (L) 26.0 - 34.0 pg   MCHC 27.0 (L) 30.0 - 36.0 g/dL   RDW 24.0 (H) 11.5 - 15.5 %   Platelets 265 150 - 400 K/uL   nRBC 0.0 0.0 - 0.2 %    Comment: Performed at [facility], [address] [place], [place] [address]   Glucose, capillary     Status: None   Collection Time: 07/04/21  7:55 AM  Result Value Ref Range   Glucose-Capillary 85 70 - 99 mg/dL    Comment: Glucose reference range applies only to samples taken after fasting for at least 8 hours.  Glucose, capillary     Status: None   Collection Time: 07/04/21 11:31 AM  Result Value Ref Range   Glucose-Capillary 83 70 - 99 mg/dL    Comment: Glucose reference range applies only to samples taken after fasting for at least 8 hours.  Basic metabolic panel     Status: Abnormal   Collection Time: 07/05/21  5:49 AM  Result Value Ref Range  Sodium 135 135 - 145 mmol/L   Potassium 3.7 3.5 - 5.1 mmol/L   Chloride 105 98 - 111 mmol/L   CO2 24 22 - 32 mmol/L   Glucose, Bld 75 70 - 99 mg/dL    Comment: Glucose reference range applies only to samples taken after fasting for at least 8 hours.   BUN 8 8 - 23 mg/dL   Creatinine, Ser 0.66 0.44 - 1.00 mg/dL   Calcium 8.3 (L) 8.9 - 10.3 mg/dL   GFR, Estimated >60 >60 mL/min    Comment: (NOTE) Calculated using the CKD-EPI Creatinine Equation (2021)    Anion gap 6 5 - 15    Comment: Performed at [facility], [address] [place], [place] [address]  CBC     Status: Abnormal   Collection Time: 07/05/21  5:49 AM  Result Value Ref Range   WBC 6.6 4.0 - 10.5 K/uL   RBC 3.72 (L) 3.87 - 5.11 MIL/uL   Hemoglobin 7.6 (L) 12.0 - 15.0 g/dL    Comment: Reticulocyte Hemoglobin testing may be clinically indicated, consider ordering this additional test [id number]    HCT 27.9 (L) 36.0 - 46.0 %   MCV 75.0 (L) 80.0 - 100.0 fL   MCH 20.4 (L) 26.0 - 34.0 pg   MCHC 27.2 (L) 30.0 - 36.0 g/dL   RDW 23.9 (H) 11.5 - 15.5 %   Platelets 249 150 - 400 K/uL   nRBC 0.0 0.0 - 0.2 %    Comment: Performed at [facility], [address], [place], [place] [address]  Glucose, capillary     Status: None   Collection Time: 07/05/21  7:32 AM  Result Value Ref Range   Glucose-Capillary 78 70 - 99 mg/dL    Comment: Glucose reference  range applies only to samples taken after fasting for at least 8 hours.    DG Abdomen 1 View  Result Date: 07/03/2021 CLINICAL DATA:  Constipation EXAM: ABDOMEN - 1 VIEW COMPARISON:  CT pelvis June 28, 2021 FINDINGS: The bowel gas pattern is normal. Moderate volume of formed stool throughout the colon. Degenerative changes bilateral hips. Lumbar spondylosis. IMPRESSION: Nonobstructive bowel gas pattern. Moderate volume of formed stool throughout the colon. Electronically Signed   By: Dahlia Bailiff MD   On: 07/03/2021 17:45    ROS:  Pertinent items are noted in HPI.  Blood pressure (!) 116/53, pulse 70, temperature 97.9 F (36.6 C), temperature source Oral, resp. rate 14, height '5\' 2"'$  (1.575 m), weight 124.2 kg, last menstrual period 09/13/2016, SpO2 100 %, unknown if currently breastfeeding. Physical Exam: Pleasant white female no acute distress Head is normocephalic, atraumatic Neck is supple without lymphadenopathy Lungs clear to auscultation with good breath sounds bilaterally Heart examination reveals an irregular rate and rhythm Abdomen is soft, nontender, nondistended.  An umbilical hernia is present which is reducible.  No masses are noted.  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  History of atrial fibrillation with now controlled rate.  History of an ejection fraction around 30%.  Patient has been seen by cardiology and they states she is at mild to moderate risk for cardiopulmonary event, but the surgery needs to be performed due to the near obstructing nature of the lesion.  I explained all this to the patient including the risks and benefits of surgery including infection, bleeding, cardiopulmonary difficulties, and anastomotic leak.  She understands these risks and would like to proceed with surgery.  Patient is being scheduled for a partial colectomy tomorrow.  A preoperative bowel prep has been  ordered.  Discussed with Drs. Alda Lea and Abbey Chatters.  She will require postoperative ICU monitoring.  [name] Signs 07/05/2021, 2:40 PM

## 2021-07-05 NOTE — Transfer of Care (Signed)
Immediate Anesthesia Transfer of Care Note  Patient: Lauren [name]  Procedure(s) Performed: COLONOSCOPY WITH PROPOFOL ESOPHAGOGASTRODUODENOSCOPY (EGD) WITH PROPOFOL BIOPSY POLYPECTOMY  Patient Location: PACU  Anesthesia Type:General  Level of Consciousness: awake  Airway & Oxygen Therapy: Patient Spontanous Breathing and Patient connected to nasal cannula oxygen  Post-op Assessment: Report given to RN and Post -op Vital signs reviewed and stable  Post vital signs: Reviewed and stable  Last Vitals:  Vitals Value Taken Time  BP    Temp    Pulse 104 07/05/21 1030  Resp 23 07/05/21 1030  SpO2 100 % 07/05/21 1030  Vitals shown include unvalidated device data.  Last Pain:  Vitals:   07/05/21 0958  TempSrc:   PainSc: 0-No pain      Patients Stated Pain Goal: 7 ([id number])  Complications: No notable events documented.

## 2021-07-05 NOTE — Plan of Care (Signed)

## 2021-07-05 NOTE — Interval H&P Note (Signed)
History and Physical Interval Note:  07/05/2021 9:52 AM  Lauren [name]  has presented today for surgery, with the diagnosis of Abnormal CT and iron deficiency anemia.  The various methods of treatment have been discussed with the patient and family. After consideration of risks, benefits and other options for treatment, the patient has consented to  Procedure(s): COLONOSCOPY WITH PROPOFOL (N/A) ESOPHAGOGASTRODUODENOSCOPY (EGD) WITH PROPOFOL (N/A) as a surgical intervention.  The patient's history has been reviewed, patient examined, no change in status, stable for surgery.  I have reviewed the patient's chart and labs.  Questions were answered to the patient's satisfaction.     [name]

## 2021-07-05 NOTE — Op Note (Addendum)
[facility] Patient Name: Lauren [name] Procedure Date: 07/05/2021 10:05 AM MRN: [id number] Date of Birth: [birth year] Attending MD: Elon Alas. Abbey Chatters DO CSN: [id number] Age: 75 Admit Type: Inpatient Procedure:                Colonoscopy Indications:              Heme positive stool, Iron deficiency anemia Providers:                Elon Alas. Abbey Chatters, DO, Janeece Riggers, RN, Vista Mink, Technician Referring MD:              Medicines:                See the Anesthesia note for documentation of the                            administered medications Complications:            No immediate complications. Estimated Blood Loss:     Estimated blood loss was minimal. Procedure:                Pre-Anesthesia Assessment:                           - The anesthesia plan was to use monitored                            anesthesia care (MAC).                           After obtaining informed consent, the colonoscope                            was passed under direct vision. Throughout the                            procedure, the patient's blood pressure, pulse, and                            oxygen saturations were monitored continuously. The                            [id number]-HQ190L ([id number]) scope was introduced through                            the anus and advanced to the the cecum, identified                            by appendiceal orifice and ileocecal valve. The                            colonoscopy was performed without difficulty. The                            patient tolerated the procedure  well. The quality                            of the bowel preparation was evaluated using the                            BBPS [facility] Bowel Preparation Scale) with scores                            of: Right Colon = 2 (minor amount of residual                            staining, small fragments of stool and/or opaque                            liquid, but  mucosa seen well), Transverse Colon = 2                            (minor amount of residual staining, small fragments                            of stool and/or opaque liquid, but mucosa seen                            well) and Left Colon = 2 (minor amount of residual                            staining, small fragments of stool and/or opaque                            liquid, but mucosa seen well). The total BBPS score                            equals 6. The quality of the bowel preparation was                            fair. Scope In: 10:08:06 AM Scope Out: 10:25:20 AM Scope Withdrawal Time: 0 hours 11 minutes 25 seconds  Total Procedure Duration: 0 hours 17 minutes 14 seconds  Findings:      The perianal and digital rectal examinations were normal.      Non-bleeding internal hemorrhoids were found during endoscopy.      Multiple small and large-mouthed diverticula were found in the sigmoid       colon and descending colon.      Two sessile polyps were found in the transverse colon. The polyps were 5       to 7 mm in size. These polyps were removed with a cold snare. Resection       and retrieval were complete.      A fungating, infiltrative, polypoid and submucosal partially obstructing       mass was found in the cecum. The mass was circumferential. No bleeding       was present. Biopsies were taken with a cold forceps for histology.      Three  polyps were found in the cecum/ascending colon. These were not       removed. Impression:               - Preparation of the colon was fair.                           - Non-bleeding internal hemorrhoids.                           - Diverticulosis in the sigmoid colon and in the                            descending colon.                           - Two 5 to 7 mm polyps in the transverse colon,                            removed with a cold snare. Resected and retrieved.                           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 Biopsied.                           - Three polyps in the ascending colon. Moderate Sedation:      Per Anesthesia Care Recommendation:           - Return patient to hospital ward for ongoing care.                           - Resume regular diet.                           - Anemia likely due to cecal/ascending colon                            mass/malignancy. Will need surgical referral for R                            hemicolectomy. This can be done in the outpatient                            setting. Will also need oncology referral. Await                            pathology results. Cecal/ascending colon polyps                            left as these will also be removed with surgical                            resection of mass. Prep not ideal.  Would recommend                            repeat colonoscopy 6 months after surgical                            resection. Will need ct chest and abd for staging                            purposes. Appears her CT was just of pelvis. Will                            discuss case with surgery. Procedure Code(s):        --- Professional ---                           [phone], Colonoscopy, flexible; with removal of                            tumor(s), polyp(s), or other lesion(s) by snare                            technique                           45380, 72, Colonoscopy, flexible; with biopsy,                            single or multiple Diagnosis Code(s):        --- Professional ---                           K64.8, Other hemorrhoids                           K63.5, Polyp of colon                           D49.0, Neoplasm of unspecified behavior of                            digestive system                           K56.690, Other partial intestinal obstruction                           R19.5, Other fecal abnormalities                           D50.9, Iron deficiency anemia,  unspecified                           K57.30, Diverticulosis of large intestine without                            perforation or abscess without bleeding CPT copyright 2019 American Medical  Association. All rights reserved. The codes documented in this report are preliminary and upon coder review may  be revised to meet current compliance requirements. Elon Alas. Abbey Chatters, DO [place] Abbey Chatters, DO 07/05/2021 11:10:15 AM This report has been signed electronically. Number of Addenda: 0

## 2021-07-05 NOTE — Anesthesia Preprocedure Evaluation (Addendum)
Anesthesia Evaluation  Patient identified by MRN, date of birth, ID band Patient awake    Reviewed: Allergy & Precautions, NPO status , Patient's Chart, lab work & pertinent test results  Airway Mallampati: II  TM Distance: <3 FB Neck ROM: Full    Dental  (+) Loose, Dental Advisory Given, Chipped, Poor Dentition Broken:   Pulmonary neg pulmonary ROS,    Pulmonary exam normal        Cardiovascular hypertension, +CHF  Normal cardiovascular exam+ dysrhythmias Atrial Fibrillation      Neuro/Psych Anxiety negative neurological ROS     GI/Hepatic negative GI ROS, Neg liver ROS,   Endo/Other  Hypothyroidism   Renal/GU negative Renal ROS  negative genitourinary   Musculoskeletal negative musculoskeletal ROS (+)   Abdominal   Peds negative pediatric ROS (+)  Hematology negative hematology ROS (+) anemia ,   Anesthesia Other Findings Super morbid obesity  Reproductive/Obstetrics negative OB ROS                            Anesthesia Physical Anesthesia Plan  ASA: 3  Anesthesia Plan: General   Post-op Pain Management:    Induction: Intravenous  PONV Risk Score and Plan:   Airway Management Planned: Nasal Cannula  Additional Equipment:   Intra-op Plan:   Post-operative Plan:   Informed Consent: I have reviewed the patients History and Physical, chart, labs and discussed the procedure including the risks, benefits and alternatives for the proposed anesthesia with the patient or authorized representative who has indicated his/her understanding and acceptance.       Plan Discussed with: CRNA  Anesthesia Plan Comments:        Anesthesia Quick Evaluation

## 2021-07-05 NOTE — Op Note (Signed)
[facility] Patient Name: Lauren [name] Procedure Date: 07/05/2021 9:41 AM MRN: [id number] Date of Birth: Jan 26, 1946 Attending MD: Elon Alas. Abbey Chatters DO CSN: [id number] Age: 75 Admit Type: Inpatient Procedure:                Upper GI endoscopy Indications:              Iron deficiency anemia Providers:                Elon Alas. Abbey Chatters, DO, Janeece Riggers, RN, Kristine L.                            Risa Grill, Technician Referring MD:              Medicines:                See the Anesthesia note for documentation of the                            administered medications Complications:            No immediate complications. Estimated Blood Loss:     Estimated blood loss: none. Procedure:                Pre-Anesthesia Assessment:                           - The anesthesia plan was to use monitored                            anesthesia care (MAC).                           After obtaining informed consent, the endoscope was                            passed under direct vision. Throughout the                            procedure, the patient's blood pressure, pulse, and                            oxygen saturations were monitored continuously. The                            [id number]-[id number] ([id number]) scope was introduced through the                            mouth, and advanced to the second part of duodenum.                            The upper GI endoscopy was accomplished without                            difficulty. The patient tolerated the procedure                            well. Scope In: 10:02:17  AM Scope Out: 10:04:11 AM Total Procedure Duration: 0 hours 1 minute 54 seconds  Findings:      A small hiatal hernia was present.      A non-obstructing Schatzki ring was found in the distal esophagus.      The entire examined stomach was normal.      The duodenal bulb, first portion of the duodenum and second portion of       the duodenum were normal. Impression:               -  Small hiatal hernia.                           - Non-obstructing Schatzki ring.                           - Normal stomach.                           - Normal duodenal bulb, first portion of the                            duodenum and second portion of the duodenum.                           - No specimens collected. Moderate Sedation:      Per Anesthesia Care Recommendation:           - No active bleeding of stigmata of bleeding. No                            identifiable source of anemia on EGD. Proceed with                            colonoscopy. Procedure Code(s):        --- Professional ---                           [phone], Esophagogastroduodenoscopy, flexible,                            transoral; diagnostic, including collection of                            specimen(s) by brushing or washing, when performed                            (separate procedure) Diagnosis Code(s):        --- Professional ---                           K44.9, Diaphragmatic hernia without obstruction or                            gangrene                           K22.2, Esophageal obstruction  D50.9, Iron deficiency anemia, unspecified CPT copyright 2019 American Medical Association. All rights reserved. The codes documented in this report are preliminary and upon coder review may  be revised to meet current compliance requirements. Elon Alas. Abbey Chatters, DO [place] Abbey Chatters, DO 07/05/2021 10:07:13 AM This report has been signed electronically. Number of Addenda: 0

## 2021-07-05 NOTE — Progress Notes (Signed)
PROGRESS NOTE  Lauren [name] [id number] DOB: 1946-09-12 DOA: 07/03/2021 PCP: Pcp, No  Brief History:  75 year old female with a history of atrial fibrillation, morbid obesity, diastolic CHF, hypertension, lymphedema presenting with constipation and rectal pain of 2 day duration.  The patient was recently admitted to [facility] from 06/28/2021 to 06/30/2021 for constipation.  During that admission, the patient was noted to have a hemoglobin of 5.6.  She was transfused with 2 units PRBC.  Her discharge hemoglobin was 8.  She had a CT of the abdomen and pelvis which showed cecal thickening raising questions of a possible cecal mass with small adjacent lymph nodes.  There is also skin thickening over her pannus.  The patient was given cathartics and ultimately had a bowel movement.  The patient was scheduled to have colonoscopy performed by general surgery, but she left AGAINST MEDICAL ADVICE prior to this being done.  An echocardiogram was performed on 06/28/2021 which showed EF 30%, mild to moderate MR, and normal RV. The patient states that she has not been fully compliant with all her prescription medications.  She has not taken her prescription medicines including her torsemide, Xarelto, and diltiazem since her discharge from [facility].  Even prior to that, patient states that she has not been fully compliant with her medications.  She states that she does not use any cathartics at home.  She denies any hematochezia, melena, hematemesis. In the emergency department, patient was afebrile and hemodynamically stable.  She was noted to have atrial fibrillation with RVR.  Oxygen saturation was 96% on room air.  BMP was unremarkable.  WBC 8.3, hemoglobin 7.8, platelets 265,000.  LFTs were unremarkable.   Assessment/Plan: Atrial fibrillation with RVR -Continue metoprolol succinate 25 mg bid -digoxin added by cardiology -06/29/21 Echo EF 30%, mild-mod MR -Soft blood pressures limit titration of her chronotropic  medications -Personally reviewed EKG--atrial fibrillation, nonspecific T wave change -holding rivaroxaban in preparation for surgery -cardiology consulted for cardiac clearance   Abnormal CT abdomen/cecal mass -GI consult appreciated--discussed with Dr. Abbey Chatters -06/28/2021 CT abdomen as discussed above -07/05/21 colonoscopy--partially obstructing cecal mass -general surgery consult -case discussed with Dr. Yehuda Mao R-hemicolectomy 07/06/21  Situational Anxiety -started in IV ativan by Dr. Arnoldo Morale -minimize opioids as she became very somnolent with combo on 07/03/21   Iron deficiency anemia -06/29/2021 iron saturation 4, ferritin 5 -Holding iron supplementation until GI work-up was done   Morbid obesity -BMI 50.08 -Lifestyle modification   Hypothyroidism -Continue Synthroid   Lymphedema -Restart torsemide   Chronic systolic and Diastolic CHF -clinically euvolemic -restart home torsemide         Status is: Observation   The patient will require care spanning > 2 midnights and should be moved to inpatient because: Inpatient level of care appropriate due to severity of illness   Dispo: The patient is from: Home              Anticipated d/c is to: Home              Patient currently is not medically stable to d/c.              Difficult to place patient No               Family Communication:  no Family at bedside   Consultants:  GI   Code Status:  FULL    DVT Prophylaxis: Xarelto on hold     Procedures: As Listed  in Progress Note Above   Antibiotics: None     Subjective: Patient denies fevers, chills, headache, chest pain, dyspnea, nausea, vomiting, diarrhea, abdominal pain, dysuria, hematuria, hematochezia, and melena.   Objective: Vitals:   07/05/21 1045 07/05/21 1100 07/05/21 1115 07/05/21 1407  BP: (!) 123/106 120/86 120/66 (!) 116/53  Pulse: (!) 103 (!) 127  70  Resp: '20 12  14  '$ Temp:    97.9 F (36.6 C)  TempSrc:    Oral  SpO2: 100%  100% 98% 100%  Weight:      Height:        Intake/Output Summary (Last 24 hours) at 07/05/2021 1504 Last data filed at 07/05/2021 1100 Gross per 24 hour  Intake 2560 ml  Output 0 ml  Net 2560 ml   Weight change:  Exam:  General:  Pt is alert, follows commands appropriately, not in acute distress HEENT: No icterus, No thrush, No neck mass, [place]/AT Cardiovascular: IRRR, S1/S2, no rubs, no gallops Respiratory: bibasilar crackles. No wheeze Abdomen: Soft/+BS, non tender, non distended, no guarding Extremities: No edema, No lymphangitis, No petechiae, No rashes, no synovitis   Data Reviewed: I have personally reviewed following labs and imaging studies Basic Metabolic Panel: Recent Labs  Lab 07/03/21 1342 07/04/21 0519 07/05/21 0549  NA 135 136 135  K 3.8 3.7 3.7  CL 103 103 105  CO2 '27 27 24  '$ GLUCOSE 105* 83 75  BUN '13 11 8  '$ CREATININE 0.73 0.65 0.66  CALCIUM 8.3* 8.4* 8.3*   Liver Function Tests: Recent Labs  Lab 07/03/21 1342  AST 22  ALT 19  ALKPHOS 104  BILITOT 1.3*  PROT 5.6*  ALBUMIN 2.7*   No results for input(s): LIPASE, AMYLASE in the last 168 hours. No results for input(s): AMMONIA in the last 168 hours. Coagulation Profile: No results for input(s): INR, PROTIME in the last 168 hours. CBC: Recent Labs  Lab 07/03/21 1342 07/04/21 0519 07/05/21 0549  WBC 8.3 6.5 6.6  NEUTROABS 5.9  --   --   HGB 7.8* 7.8* 7.6*  HCT 28.3* 28.9* 27.9*  MCV 75.3* 75.9* 75.0*  PLT 253 265 249   Cardiac Enzymes: No results for input(s): CKTOTAL, CKMB, CKMBINDEX, TROPONINI in the last 168 hours. BNP: Invalid input(s): POCBNP CBG: Recent Labs  Lab 07/04/21 0755 07/04/21 1131 07/05/21 0732  GLUCAP 85 83 78   HbA1C: No results for input(s): HGBA1C in the last 72 hours. Urine analysis:    Component Value Date/Time   COLORURINE YELLOW 03/26/2014 1346   APPEARANCEUR CLEAR 03/26/2014 1346   LABSPEC 1.011 03/26/2014 1346   PHURINE 5.0 03/26/2014 1346    GLUCOSEU NEGATIVE 03/26/2014 1346   HGBUR MODERATE (A) 03/26/2014 1346   BILIRUBINUR NEGATIVE 03/26/2014 1346   KETONESUR NEGATIVE 03/26/2014 1346   PROTEINUR NEGATIVE 03/26/2014 1346   UROBILINOGEN 0.2 03/26/2014 1346   NITRITE NEGATIVE 03/26/2014 1346   LEUKOCYTESUR NEGATIVE 03/26/2014 1346   Sepsis Labs: '@LABRCNTIP'$ (procalcitonin:4,lacticidven:4) ) Recent Results (from the past 240 hour(s))  SARS CORONAVIRUS 2 ([place] 6-24 HRS) Nasopharyngeal Nasopharyngeal Swab     Status: None   Collection Time: 07/03/21  7:45 PM   Specimen: Nasopharyngeal Swab  Result Value Ref Range Status   SARS Coronavirus 2 NEGATIVE NEGATIVE Final    Comment: (NOTE) SARS-CoV-2 target nucleic acids are NOT DETECTED.  The SARS-CoV-2 RNA is generally detectable in upper and lower respiratory specimens during the acute phase of infection. Negative results do not preclude SARS-CoV-2 infection, do not rule out co-infections  with other pathogens, and should not be used as the sole basis for treatment or other patient management decisions. Negative results must be combined with clinical observations, patient history, and epidemiological information. The expected result is Negative.  Fact Sheet for Patients: SugarRoll.be  Fact Sheet for Healthcare Providers: https://www.woods-mathews.com/  This test is not yet approved or cleared by the [place] FDA and  has been authorized for detection and/or diagnosis of SARS-CoV-2 by FDA under an Emergency Use Authorization (EUA). This EUA will remain  in effect (meaning this test can be used) for the duration of the COVID-19 declaration under Se ction 564(b)(1) of the Act, 21 U.S.C. section 360bbb-3(b)(1), unless the authorization is terminated or revoked sooner.  Performed at [facility], [place] [address], [place], [place] [address]      Scheduled Meds:  [START ON 07/06/2021] alvimopan  12 mg Oral On Call to OR    Chlorhexidine Gluconate Cloth  6 each Topical Once   And   Chlorhexidine Gluconate Cloth  6 each Topical Once   digoxin  0.25 mg Oral Daily   [START ON 07/06/2021] enoxaparin (LOVENOX) injection  40 mg Subcutaneous Once   metoprolol succinate  25 mg Oral BID   mupirocin ointment  1 application Nasal BID   neomycin  1,000 mg Oral Q4H   pantoprazole (PROTONIX) IV  40 mg Intravenous Q24H   torsemide  20 mg Oral Daily   Continuous Infusions:  [START ON 07/06/2021] ertapenem     metronidazole      Procedures/Studies: DG Abdomen 1 View  Result Date: 07/03/2021 CLINICAL DATA:  Constipation EXAM: ABDOMEN - 1 VIEW COMPARISON:  CT pelvis June 28, 2021 FINDINGS: The bowel gas pattern is normal. Moderate volume of formed stool throughout the colon. Degenerative changes bilateral hips. Lumbar spondylosis. IMPRESSION: Nonobstructive bowel gas pattern. Moderate volume of formed stool throughout the colon. Electronically Signed   By: Dahlia Bailiff MD   On: 07/03/2021 17:45    Orson Eva, DO  [facility]  If 7PM-7AM, please contact night-coverage [email or web address] Password [email or web address] 07/05/2021, 3:04 PM   LOS: 0 days

## 2021-07-05 NOTE — H&P (View-Only) (Signed)
Reason for Consult: Near obstructing ascending colon mass Referring Physician: Dr. Carles Collet, Lauren [name] is an 75 y.o. female.  HPI: Patient is a 75 year old white female who underwent a colonoscopy today for work-up of an abnormal CT scan of the pelvis and anemia.  At the time of colonoscopy, she was found to have a near obstructing ascending colon mass in the cecum.  It could not be fully removed endoscopically.  Other smaller polyps were removed from the colon but did not look malignant in nature.  She has a history of atrial fibrillation with rapid ventricular response, though her rate is now under control.  She denies any history of rectal bleeding.  She denies any family history of colon cancer.  She has been purposely trying to lose weight.  Past Medical History:  Diagnosis Date   Acute cholecystitis 12/06/2012   Atrial fibrillation with RVR ([place]) 12/06/2012   Diverticulosis    Hypothyroidism    Obesity    Pancreatitis, acute [id number]   Periumbilical hernia     Past Surgical History:  Procedure Laterality Date   CHOLECYSTECTOMY N/A 03/25/2014   Procedure: LAPAROSCOPIC CHOLECYSTECTOMY WITH INTRAOPERATIVE CHOLANGIOGRAM;  Surgeon: Ralene Ok, MD;  Location: [facility];  Service: General;  Laterality: N/A;    Family History  Problem Relation Age of Onset   Cancer Father        liver   Hypertension Brother    Heart disease Maternal Grandmother    Kidney disease Maternal Grandfather    Diabetes Maternal Grandfather    Kidney failure Brother     Social History:  reports that she has never smoked. She has never used smokeless tobacco. She reports that she does not drink alcohol and does not use drugs.  Allergies:  Allergies  Allergen Reactions   Codeine Itching   Compazine [Prochlorperazine Edisylate]    Prednisone Nausea And Vomiting   Tetanus Toxoids Other (See Comments)    Knot like "hen egg" came up    Vicodin [Hydrocodone-Acetaminophen] Itching    Medications: I  have reviewed the patient's current medications. Prior to Admission:  Medications Prior to Admission  Medication Sig Dispense Refill Last Dose   acetaminophen (TYLENOL) 500 MG tablet Take 1,000 mg by mouth every 6 (six) hours as needed.   Past Week   levothyroxine (SYNTHROID) 100 MCG tablet Take 1 tablet (100 mcg total) by mouth daily. 90 tablet 0 Past Week   magnesium citrate SOLN Take by mouth.   Past Week   rivaroxaban (XARELTO) 20 MG TABS tablet Take 1 tablet (20 mg total) by mouth every other day.   Past Week   diltiazem (CARDIZEM) 30 MG tablet Take 30 mg by mouth daily. (Patient not taking: No sig reported)   Not Taking   levothyroxine (SYNTHROID) 100 MCG tablet Take 1 tablet by mouth daily. (Patient not taking: No sig reported)   Not Taking   potassium chloride SA (KLOR-CON) 20 MEQ tablet TAKE ONE (1) TABLET EACH DAY (Patient not taking: No sig reported) 90 tablet 1 Not Taking   torsemide (DEMADEX) 20 MG tablet Take 20 mg by mouth daily. (Patient not taking: Reported on 07/03/2021)   Not Taking    Results for orders placed or performed during the hospital encounter of 07/03/21 (from the past 48 hour(s))  SARS CORONAVIRUS 2 ([place]AT 6-24 HRS) Nasopharyngeal Nasopharyngeal Swab     Status: None   Collection Time: 07/03/21  7:45 PM   Specimen: Nasopharyngeal Swab  Result Value Ref Range  SARS Coronavirus 2 NEGATIVE NEGATIVE    Comment: (NOTE) SARS-CoV-2 target nucleic acids are NOT DETECTED.  The SARS-CoV-2 RNA is generally detectable in upper and lower respiratory specimens during the acute phase of infection. Negative results do not preclude SARS-CoV-2 infection, do not rule out co-infections with other pathogens, and should not be used as the sole basis for treatment or other patient management decisions. Negative results must be combined with clinical observations, patient history, and epidemiological information. The expected result is Negative.  Fact Sheet for  Patients: SugarRoll.be  Fact Sheet for Healthcare Providers: https://www.woods-mathews.com/  This test is not yet approved or cleared by the [place] FDA and  has been authorized for detection and/or diagnosis of SARS-CoV-2 by FDA under an Emergency Use Authorization (EUA). This EUA will remain  in effect (meaning this test can be used) for the duration of the COVID-19 declaration under Se ction 564(b)(1) of the Act, 21 U.S.C. section 360bbb-3(b)(1), unless the authorization is terminated or revoked sooner.  Performed at [facility], [place] [address] [place], [place] [id number]   Basic metabolic panel     Status: Abnormal   Collection Time: 07/04/21  5:19 AM  Result Value Ref Range   Sodium 136 135 - 145 mmol/L   Potassium 3.7 3.5 - 5.1 mmol/L   Chloride 103 98 - 111 mmol/L   CO2 27 22 - 32 mmol/L   Glucose, Bld 83 70 - 99 mg/dL    Comment: Glucose reference range applies only to samples taken after fasting for at least 8 hours.   BUN 11 8 - 23 mg/dL   Creatinine, Ser 0.65 0.44 - 1.00 mg/dL   Calcium 8.4 (L) 8.9 - 10.3 mg/dL   GFR, Estimated >60 >60 mL/min    Comment: (NOTE) Calculated using the CKD-EPI Creatinine Equation (2021)    Anion gap 6 5 - 15    Comment: Performed at [facility], [address], [place], [place] [address]  CBC     Status: Abnormal   Collection Time: 07/04/21  5:19 AM  Result Value Ref Range   WBC 6.5 4.0 - 10.5 K/uL   RBC 3.81 (L) 3.87 - 5.11 MIL/uL   Hemoglobin 7.8 (L) 12.0 - 15.0 g/dL    Comment: Reticulocyte Hemoglobin testing may be clinically indicated, consider ordering this additional test [id number]    HCT 28.9 (L) 36.0 - 46.0 %   MCV 75.9 (L) 80.0 - 100.0 fL   MCH 20.5 (L) 26.0 - 34.0 pg   MCHC 27.0 (L) 30.0 - 36.0 g/dL   RDW 24.0 (H) 11.5 - 15.5 %   Platelets 265 150 - 400 K/uL   nRBC 0.0 0.0 - 0.2 %    Comment: Performed at [facility], [address] [place], [place] [address]   Glucose, capillary     Status: None   Collection Time: 07/04/21  7:55 AM  Result Value Ref Range   Glucose-Capillary 85 70 - 99 mg/dL    Comment: Glucose reference range applies only to samples taken after fasting for at least 8 hours.  Glucose, capillary     Status: None   Collection Time: 07/04/21 11:31 AM  Result Value Ref Range   Glucose-Capillary 83 70 - 99 mg/dL    Comment: Glucose reference range applies only to samples taken after fasting for at least 8 hours.  Basic metabolic panel     Status: Abnormal   Collection Time: 07/05/21  5:49 AM  Result Value Ref Range  Sodium 135 135 - 145 mmol/L   Potassium 3.7 3.5 - 5.1 mmol/L   Chloride 105 98 - 111 mmol/L   CO2 24 22 - 32 mmol/L   Glucose, Bld 75 70 - 99 mg/dL    Comment: Glucose reference range applies only to samples taken after fasting for at least 8 hours.   BUN 8 8 - 23 mg/dL   Creatinine, Ser 0.66 0.44 - 1.00 mg/dL   Calcium 8.3 (L) 8.9 - 10.3 mg/dL   GFR, Estimated >60 >60 mL/min    Comment: (NOTE) Calculated using the CKD-EPI Creatinine Equation (2021)    Anion gap 6 5 - 15    Comment: Performed at [facility], [address], [place], [place] [address]  CBC     Status: Abnormal   Collection Time: 07/05/21  5:49 AM  Result Value Ref Range   WBC 6.6 4.0 - 10.5 K/uL   RBC 3.72 (L) 3.87 - 5.11 MIL/uL   Hemoglobin 7.6 (L) 12.0 - 15.0 g/dL    Comment: Reticulocyte Hemoglobin testing may be clinically indicated, consider ordering this additional test [id number]    HCT 27.9 (L) 36.0 - 46.0 %   MCV 75.0 (L) 80.0 - 100.0 fL   MCH 20.4 (L) 26.0 - 34.0 pg   MCHC 27.2 (L) 30.0 - 36.0 g/dL   RDW 23.9 (H) 11.5 - 15.5 %   Platelets 249 150 - 400 K/uL   nRBC 0.0 0.0 - 0.2 %    Comment: Performed at [facility], [address], [place], [place] [address]  Glucose, capillary     Status: None   Collection Time: 07/05/21  7:32 AM  Result Value Ref Range   Glucose-Capillary 78 70 - 99 mg/dL    Comment: Glucose reference  range applies only to samples taken after fasting for at least 8 hours.    DG Abdomen 1 View  Result Date: 07/03/2021 CLINICAL DATA:  Constipation EXAM: ABDOMEN - 1 VIEW COMPARISON:  CT pelvis June 28, 2021 FINDINGS: The bowel gas pattern is normal. Moderate volume of formed stool throughout the colon. Degenerative changes bilateral hips. Lumbar spondylosis. IMPRESSION: Nonobstructive bowel gas pattern. Moderate volume of formed stool throughout the colon. Electronically Signed   By: Dahlia Bailiff MD   On: 07/03/2021 17:45    ROS:  Pertinent items are noted in HPI.  Blood pressure (!) 116/53, pulse 70, temperature 97.9 F (36.6 C), temperature source Oral, resp. rate 14, height '5\' 2"'$  (1.575 m), weight 124.2 kg, last menstrual period 09/13/2016, SpO2 100 %, unknown if currently breastfeeding. Physical Exam: Pleasant white female no acute distress Head is normocephalic, atraumatic Neck is supple without lymphadenopathy Lungs clear to auscultation with good breath sounds bilaterally Heart examination reveals an irregular rate and rhythm Abdomen is soft, nontender, nondistended.  An umbilical hernia is present which is reducible.  No masses are noted.  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  History of atrial fibrillation with now controlled rate.  History of an ejection fraction around 30%.  Patient has been seen by cardiology and they states she is at mild to moderate risk for cardiopulmonary event, but the surgery needs to be performed due to the near obstructing nature of the lesion.  I explained all this to the patient including the risks and benefits of surgery including infection, bleeding, cardiopulmonary difficulties, and anastomotic leak.  She understands these risks and would like to proceed with surgery.  Patient is being scheduled for a partial colectomy tomorrow.  A preoperative bowel prep has been  ordered.  Discussed with Drs. Alda Lea and Abbey Chatters.  She will require postoperative ICU monitoring.  [name] Signs 07/05/2021, 2:40 PM

## 2021-07-05 NOTE — Anesthesia Postprocedure Evaluation (Signed)
Anesthesia Post Note  Patient: ELVERIA LAUDERBAUGH  Procedure(s) Performed: COLONOSCOPY WITH PROPOFOL ESOPHAGOGASTRODUODENOSCOPY (EGD) WITH PROPOFOL BIOPSY POLYPECTOMY  Patient location during evaluation: PACU Anesthesia Type: MAC Level of consciousness: awake and alert Pain management: pain level controlled Vital Signs Assessment: post-procedure vital signs reviewed and stable Respiratory status: spontaneous breathing, nonlabored ventilation, respiratory function stable and patient connected to nasal cannula oxygen Cardiovascular status: blood pressure returned to baseline and stable Postop Assessment: no apparent nausea or vomiting Anesthetic complications: no   No notable events documented.   Last Vitals:  Vitals:   07/05/21 1100 07/05/21 1115  BP: 120/86 120/66  Pulse: (!) 127   Resp: 12   Temp:    SpO2: 100% 98%    Last Pain:  Vitals:   07/05/21 1100  TempSrc:   PainSc: 0-No pain                 Nicanor Alcon

## 2021-07-06 ENCOUNTER — Observation Stay (HOSPITAL_COMMUNITY): Payer: Medicare HMO | Admitting: Certified Registered"

## 2021-07-06 ENCOUNTER — Encounter (HOSPITAL_COMMUNITY): Payer: Self-pay | Admitting: Internal Medicine

## 2021-07-06 ENCOUNTER — Encounter (HOSPITAL_COMMUNITY)
Admission: EM | Disposition: A | Discharge status: Skilled Nursing Facility | Payer: Self-pay | Source: Home / Self Care | Attending: Internal Medicine

## 2021-07-06 ENCOUNTER — Other Ambulatory Visit: Payer: Self-pay

## 2021-07-06 DIAGNOSIS — K429 Umbilical hernia without obstruction or gangrene: Secondary | ICD-10-CM | POA: Diagnosis present

## 2021-07-06 DIAGNOSIS — I482 Chronic atrial fibrillation, unspecified: Secondary | ICD-10-CM | POA: Diagnosis present

## 2021-07-06 DIAGNOSIS — K6389 Other specified diseases of intestine: Secondary | ICD-10-CM | POA: Diagnosis not present

## 2021-07-06 DIAGNOSIS — K222 Esophageal obstruction: Secondary | ICD-10-CM | POA: Diagnosis present

## 2021-07-06 DIAGNOSIS — I4891 Unspecified atrial fibrillation: Secondary | ICD-10-CM | POA: Diagnosis present

## 2021-07-06 DIAGNOSIS — Z7901 Long term (current) use of anticoagulants: Secondary | ICD-10-CM | POA: Diagnosis not present

## 2021-07-06 DIAGNOSIS — Z8249 Family history of ischemic heart disease and other diseases of the circulatory system: Secondary | ICD-10-CM | POA: Diagnosis not present

## 2021-07-06 DIAGNOSIS — C189 Malignant neoplasm of colon, unspecified: Secondary | ICD-10-CM | POA: Diagnosis not present

## 2021-07-06 DIAGNOSIS — I890 Lymphedema, not elsewhere classified: Secondary | ICD-10-CM | POA: Diagnosis present

## 2021-07-06 DIAGNOSIS — E039 Hypothyroidism, unspecified: Secondary | ICD-10-CM | POA: Diagnosis not present

## 2021-07-06 DIAGNOSIS — Z6841 Body Mass Index (BMI) 40.0 and over, adult: Secondary | ICD-10-CM | POA: Diagnosis not present

## 2021-07-06 DIAGNOSIS — K648 Other hemorrhoids: Secondary | ICD-10-CM | POA: Diagnosis present

## 2021-07-06 DIAGNOSIS — F32A Depression, unspecified: Secondary | ICD-10-CM | POA: Diagnosis present

## 2021-07-06 DIAGNOSIS — K567 Ileus, unspecified: Secondary | ICD-10-CM | POA: Diagnosis not present

## 2021-07-06 DIAGNOSIS — D509 Iron deficiency anemia, unspecified: Secondary | ICD-10-CM | POA: Diagnosis present

## 2021-07-06 DIAGNOSIS — K635 Polyp of colon: Secondary | ICD-10-CM | POA: Diagnosis present

## 2021-07-06 DIAGNOSIS — I5042 Chronic combined systolic (congestive) and diastolic (congestive) heart failure: Secondary | ICD-10-CM | POA: Diagnosis present

## 2021-07-06 DIAGNOSIS — I110 Hypertensive heart disease with heart failure: Secondary | ICD-10-CM | POA: Diagnosis present

## 2021-07-06 DIAGNOSIS — F419 Anxiety disorder, unspecified: Secondary | ICD-10-CM | POA: Diagnosis present

## 2021-07-06 DIAGNOSIS — Z79899 Other long term (current) drug therapy: Secondary | ICD-10-CM | POA: Diagnosis not present

## 2021-07-06 DIAGNOSIS — K5730 Diverticulosis of large intestine without perforation or abscess without bleeding: Secondary | ICD-10-CM | POA: Diagnosis present

## 2021-07-06 DIAGNOSIS — C182 Malignant neoplasm of ascending colon: Secondary | ICD-10-CM | POA: Diagnosis present

## 2021-07-06 DIAGNOSIS — Z79890 Hormone replacement therapy (postmenopausal): Secondary | ICD-10-CM | POA: Diagnosis not present

## 2021-07-06 DIAGNOSIS — Z20822 Contact with and (suspected) exposure to covid-19: Secondary | ICD-10-CM | POA: Diagnosis present

## 2021-07-06 DIAGNOSIS — K449 Diaphragmatic hernia without obstruction or gangrene: Secondary | ICD-10-CM | POA: Diagnosis present

## 2021-07-06 DIAGNOSIS — Z809 Family history of malignant neoplasm, unspecified: Secondary | ICD-10-CM | POA: Diagnosis not present

## 2021-07-06 HISTORY — PX: PARTIAL COLECTOMY: SHX5273

## 2021-07-06 LAB — CEA: CEA: 3 ng/mL (ref 0.0–4.7)

## 2021-07-06 LAB — BASIC METABOLIC PANEL
Anion gap: 7 (ref 5–15)
BUN: 7 mg/dL — ABNORMAL LOW (ref 8–23)
CO2: 26 mmol/L (ref 22–32)
Calcium: 8 mg/dL — ABNORMAL LOW (ref 8.9–10.3)
Chloride: 103 mmol/L (ref 98–111)
Creatinine, Ser: 0.76 mg/dL (ref 0.44–1.00)
GFR, Estimated: >60 mL/min (ref >60)
Glucose, Bld: 74 mg/dL (ref 70–99)
Potassium: 3.7 mmol/L (ref 3.5–5.1)
Sodium: 136 mmol/L (ref 135–145)

## 2021-07-06 LAB — CBC
HCT: 27.6 % — ABNORMAL LOW (ref 36.0–46.0)
Hemoglobin: 7.5 g/dL — ABNORMAL LOW (ref 12.0–15.0)
MCH: 20.2 pg — ABNORMAL LOW (ref 26.0–34.0)
MCHC: 27.2 g/dL — ABNORMAL LOW (ref 30.0–36.0)
MCV: 74.2 fL — ABNORMAL LOW (ref 80.0–100.0)
Platelets: 228 10*3/uL (ref 150–400)
RBC: 3.72 MIL/uL — ABNORMAL LOW (ref 3.87–5.11)
RDW: 23.7 % — ABNORMAL HIGH (ref 11.5–15.5)
WBC: 6.6 10*3/uL (ref 4.0–10.5)
nRBC: 0 % (ref 0.0–0.2)

## 2021-07-06 SURGERY — COLECTOMY, PARTIAL
Anesthesia: General | Site: Abdomen

## 2021-07-06 MED ORDER — SUCCINYLCHOLINE CHLORIDE 200 MG/10ML IV SOSY
PREFILLED_SYRINGE | INTRAVENOUS | Status: AC
  Filled 2021-07-06: qty 10

## 2021-07-06 MED ORDER — KETOROLAC TROMETHAMINE 30 MG/ML IJ SOLN: 30.0000 mg | Freq: Once | INTRAMUSCULAR | Status: DC

## 2021-07-06 MED ORDER — PHENYLEPHRINE 40 MCG/ML (10ML) SYRINGE FOR IV PUSH (FOR BLOOD PRESSURE SUPPORT)
PREFILLED_SYRINGE | INTRAVENOUS | Status: AC
  Filled 2021-07-06: qty 10

## 2021-07-06 MED ORDER — POVIDONE-IODINE 10 % OINT PACKET
TOPICAL_OINTMENT | CUTANEOUS | Status: DC | PRN
  Administered 2021-07-06: 1 via TOPICAL

## 2021-07-06 MED ORDER — HYDROMORPHONE HCL 1 MG/ML IJ SOLN
INTRAMUSCULAR | Status: AC
  Filled 2021-07-06: qty 1

## 2021-07-06 MED ORDER — BUPIVACAINE LIPOSOME 1.3 % IJ SUSP
INTRAMUSCULAR | Status: DC | PRN
  Administered 2021-07-06: 20 mL

## 2021-07-06 MED ORDER — BUPIVACAINE LIPOSOME 1.3 % IJ SUSP
INTRAMUSCULAR | Status: AC
  Filled 2021-07-06: qty 20

## 2021-07-06 MED ORDER — KETOROLAC TROMETHAMINE 30 MG/ML IJ SOLN
INTRAMUSCULAR | Status: AC
  Filled 2021-07-06: qty 1

## 2021-07-06 MED ORDER — POVIDONE-IODINE 10 % EX OINT
TOPICAL_OINTMENT | CUTANEOUS | Status: AC
  Filled 2021-07-06: qty 1

## 2021-07-06 MED ORDER — ONDANSETRON 4 MG PO TBDP
4.0000 mg | ORAL_TABLET | Freq: Four times a day (QID) | ORAL | Status: DC | PRN
  Administered 2021-07-12: 4 mg via ORAL
  Filled 2021-07-06: qty 1

## 2021-07-06 MED ORDER — LACTATED RINGERS IV SOLN: INTRAVENOUS | Status: DC | PRN

## 2021-07-06 MED ORDER — ALBUMIN HUMAN 25 % IV SOLN
INTRAVENOUS | Status: AC
  Filled 2021-07-06: qty 50

## 2021-07-06 MED ORDER — KETOROLAC TROMETHAMINE 15 MG/ML IJ SOLN
15.0000 mg | Freq: Four times a day (QID) | INTRAMUSCULAR | Status: AC | PRN
  Administered 2021-07-07 – 2021-07-11 (×7): 15 mg via INTRAVENOUS
  Filled 2021-07-06 (×7): qty 1

## 2021-07-06 MED ORDER — METOPROLOL TARTRATE 5 MG/5ML IV SOLN
INTRAVENOUS | Status: AC
  Filled 2021-07-06: qty 5

## 2021-07-06 MED ORDER — SUGAMMADEX SODIUM 200 MG/2ML IV SOLN
INTRAVENOUS | Status: DC | PRN
  Administered 2021-07-06 (×2): 200 mg via INTRAVENOUS

## 2021-07-06 MED ORDER — ACETAMINOPHEN 10 MG/ML IV SOLN
1000.0000 mg | Freq: Once | INTRAVENOUS | Status: DC
Start: 2021-07-06 — End: 2021-07-06
  Administered 2021-07-06: 1000 mg via INTRAVENOUS

## 2021-07-06 MED ORDER — ROCURONIUM BROMIDE 10 MG/ML (PF) SYRINGE
PREFILLED_SYRINGE | INTRAVENOUS | Status: DC | PRN
Start: 2021-07-06 — End: 2021-07-06
  Administered 2021-07-06: 50 mg via INTRAVENOUS
  Administered 2021-07-06 (×2): 10 mg via INTRAVENOUS

## 2021-07-06 MED ORDER — KETOROLAC TROMETHAMINE 30 MG/ML IJ SOLN
30.0000 mg | Freq: Once | INTRAMUSCULAR | Status: AC
  Administered 2021-07-06: 30 mg via INTRAVENOUS

## 2021-07-06 MED ORDER — FENTANYL CITRATE (PF) 100 MCG/2ML IJ SOLN
INTRAMUSCULAR | Status: DC | PRN
  Administered 2021-07-06: 100 ug via INTRAVENOUS
  Administered 2021-07-06: 50 ug via INTRAVENOUS
  Administered 2021-07-06: 100 ug via INTRAVENOUS
  Administered 2021-07-06 (×3): 50 ug via INTRAVENOUS

## 2021-07-06 MED ORDER — ONDANSETRON HCL 4 MG/2ML IJ SOLN: 4.0000 mg | Freq: Once | INTRAMUSCULAR | Status: DC | PRN

## 2021-07-06 MED ORDER — HEMOSTATIC AGENTS (NO CHARGE) OPTIME
TOPICAL | Status: DC | PRN
  Administered 2021-07-06: 1 via TOPICAL

## 2021-07-06 MED ORDER — METOPROLOL TARTRATE 5 MG/5ML IV SOLN
INTRAVENOUS | Status: DC | PRN
  Administered 2021-07-06: 1 mg via INTRAVENOUS
  Administered 2021-07-06: 2 mg via INTRAVENOUS
  Administered 2021-07-06 (×2): 1 mg via INTRAVENOUS

## 2021-07-06 MED ORDER — ENOXAPARIN SODIUM 40 MG/0.4ML IJ SOSY
40.0000 mg | PREFILLED_SYRINGE | INTRAMUSCULAR | Status: DC
  Administered 2021-07-07 – 2021-07-08 (×2): 40 mg via SUBCUTANEOUS
  Filled 2021-07-06: qty 0.4

## 2021-07-06 MED ORDER — FENTANYL CITRATE (PF) 250 MCG/5ML IJ SOLN
INTRAMUSCULAR | Status: AC
  Filled 2021-07-06: qty 10

## 2021-07-06 MED ORDER — LEVOTHYROXINE SODIUM 100 MCG/5ML IV SOLN
50.0000 ug | Freq: Every day | INTRAVENOUS | Status: DC
  Administered 2021-07-07 – 2021-07-14 (×6): 50 ug via INTRAVENOUS
  Filled 2021-07-06 (×6): qty 5

## 2021-07-06 MED ORDER — LIDOCAINE HCL (PF) 2 % IJ SOLN
INTRAMUSCULAR | Status: AC
  Filled 2021-07-06: qty 5

## 2021-07-06 MED ORDER — HYDROMORPHONE HCL 1 MG/ML IJ SOLN
0.2500 mg | INTRAMUSCULAR | Status: DC | PRN
  Administered 2021-07-06 (×2): 0.5 mg via INTRAVENOUS
  Filled 2021-07-06 (×2): qty 0.5

## 2021-07-06 MED ORDER — ACETAMINOPHEN 10 MG/ML IV SOLN
INTRAVENOUS | Status: AC
  Filled 2021-07-06: qty 100

## 2021-07-06 MED ORDER — CHLORHEXIDINE GLUCONATE CLOTH 2 % EX PADS
6.0000 | MEDICATED_PAD | Freq: Every day | CUTANEOUS | Status: DC
  Administered 2021-07-06 – 2021-07-10 (×5): 6 via TOPICAL

## 2021-07-06 MED ORDER — KETOROLAC TROMETHAMINE 15 MG/ML IJ SOLN
15.0000 mg | Freq: Four times a day (QID) | INTRAMUSCULAR | Status: AC
  Administered 2021-07-06: 15 mg via INTRAVENOUS
  Filled 2021-07-06: qty 1

## 2021-07-06 MED ORDER — ONDANSETRON HCL 4 MG/2ML IJ SOLN
4.0000 mg | Freq: Four times a day (QID) | INTRAMUSCULAR | Status: DC | PRN
  Administered 2021-07-07 – 2021-07-17 (×11): 4 mg via INTRAVENOUS
  Filled 2021-07-06 (×12): qty 2

## 2021-07-06 MED ORDER — DEXAMETHASONE SODIUM PHOSPHATE 10 MG/ML IJ SOLN
INTRAMUSCULAR | Status: AC
  Filled 2021-07-06: qty 1

## 2021-07-06 MED ORDER — LIDOCAINE 2% (20 MG/ML) 5 ML SYRINGE
INTRAMUSCULAR | Status: DC | PRN
  Administered 2021-07-06: 100 mg via INTRAVENOUS

## 2021-07-06 MED ORDER — MORPHINE SULFATE (PF) 2 MG/ML IV SOLN
1.0000 mg | INTRAVENOUS | Status: DC | PRN
Start: 2021-07-06 — End: 2021-07-10
  Administered 2021-07-07 – 2021-07-09 (×3): 1 mg via INTRAVENOUS
  Filled 2021-07-06 (×3): qty 1

## 2021-07-06 MED ORDER — ONDANSETRON HCL 4 MG/2ML IJ SOLN
INTRAMUSCULAR | Status: AC
  Filled 2021-07-06: qty 2

## 2021-07-06 MED ORDER — TRAMADOL HCL 50 MG PO TABS
50.0000 mg | ORAL_TABLET | Freq: Four times a day (QID) | ORAL | Status: DC | PRN
Start: 2021-07-06 — End: 2021-07-17
  Administered 2021-07-07 – 2021-07-17 (×10): 50 mg via ORAL
  Filled 2021-07-06 (×11): qty 1

## 2021-07-06 MED ORDER — PHENYLEPHRINE HCL-NACL 20-0.9 MG/250ML-% IV SOLN
INTRAVENOUS | Status: DC | PRN
  Administered 2021-07-06: 40 ug/min via INTRAVENOUS

## 2021-07-06 MED ORDER — SIMETHICONE 80 MG PO CHEW
40.0000 mg | CHEWABLE_TABLET | Freq: Four times a day (QID) | ORAL | Status: DC | PRN
  Administered 2021-07-11: 40 mg via ORAL
  Filled 2021-07-06 (×2): qty 1

## 2021-07-06 MED ORDER — MEPERIDINE HCL 50 MG/ML IJ SOLN: 6.2500 mg | INTRAMUSCULAR | Status: DC | PRN

## 2021-07-06 MED ORDER — HYDROMORPHONE HCL 1 MG/ML IJ SOLN
INTRAMUSCULAR | Status: DC | PRN
  Administered 2021-07-06 (×2): .5 mg via INTRAVENOUS

## 2021-07-06 MED ORDER — PHENYLEPHRINE HCL-NACL 20-0.9 MG/250ML-% IV SOLN
INTRAVENOUS | Status: AC
  Filled 2021-07-06: qty 250

## 2021-07-06 MED ORDER — SODIUM CHLORIDE 0.9 % IV SOLN: INTRAVENOUS | Status: DC

## 2021-07-06 MED ORDER — DEXAMETHASONE SODIUM PHOSPHATE 10 MG/ML IJ SOLN
INTRAMUSCULAR | Status: DC | PRN
Start: 2021-07-06 — End: 2021-07-06
  Administered 2021-07-06: 4 mg via INTRAVENOUS

## 2021-07-06 MED ORDER — ROCURONIUM BROMIDE 10 MG/ML (PF) SYRINGE
PREFILLED_SYRINGE | INTRAVENOUS | Status: AC
  Filled 2021-07-06: qty 10

## 2021-07-06 MED ORDER — HYDROCODONE-ACETAMINOPHEN 7.5-325 MG PO TABS: 1.0000 | ORAL_TABLET | Freq: Once | ORAL | Status: DC | PRN

## 2021-07-06 MED ORDER — DIGOXIN 0.25 MG/ML IJ SOLN
0.2500 mg | Freq: Once | INTRAMUSCULAR | Status: AC
  Administered 2021-07-06: 0.25 mg via INTRAVENOUS
  Filled 2021-07-06: qty 2

## 2021-07-06 MED ORDER — 0.9 % SODIUM CHLORIDE (POUR BTL) OPTIME
TOPICAL | Status: DC | PRN
  Administered 2021-07-06: 2000 mL

## 2021-07-06 MED ORDER — PHENYLEPHRINE 40 MCG/ML (10ML) SYRINGE FOR IV PUSH (FOR BLOOD PRESSURE SUPPORT)
PREFILLED_SYRINGE | INTRAVENOUS | Status: DC | PRN
Start: 2021-07-06 — End: 2021-07-06
  Administered 2021-07-06: 80 ug via INTRAVENOUS
  Administered 2021-07-06: 40 ug via INTRAVENOUS
  Administered 2021-07-06 (×4): 80 ug via INTRAVENOUS
  Administered 2021-07-06: 120 ug via INTRAVENOUS
  Administered 2021-07-06: 80 ug via INTRAVENOUS

## 2021-07-06 MED ORDER — ONDANSETRON HCL 4 MG/2ML IJ SOLN
INTRAMUSCULAR | Status: DC | PRN
  Administered 2021-07-06: 4 mg via INTRAVENOUS

## 2021-07-06 MED ORDER — VASOPRESSIN 20 UNIT/ML IV SOLN
INTRAVENOUS | Status: AC
  Filled 2021-07-06: qty 1

## 2021-07-06 MED ORDER — PROPOFOL 10 MG/ML IV BOLUS
INTRAVENOUS | Status: DC | PRN
  Administered 2021-07-06: 100 mg via INTRAVENOUS

## 2021-07-06 MED ORDER — ALVIMOPAN 12 MG PO CAPS
ORAL_CAPSULE | ORAL | Status: AC
  Filled 2021-07-06: qty 1

## 2021-07-06 MED ORDER — PANTOPRAZOLE SODIUM 40 MG PO TBEC
40.0000 mg | DELAYED_RELEASE_TABLET | Freq: Every day | ORAL | Status: DC
  Administered 2021-07-06 – 2021-07-08 (×3): 40 mg via ORAL
  Filled 2021-07-06 (×5): qty 1

## 2021-07-06 MED ORDER — SODIUM CHLORIDE 0.9 % IV SOLN
INTRAVENOUS | Status: AC
  Filled 2021-07-06: qty 1

## 2021-07-06 SURGICAL SUPPLY — 36 items
COVER LIGHT HANDLE STERIS (MISCELLANEOUS) ×8 IMPLANT
DRSG OPSITE POSTOP 4X8 (GAUZE/BANDAGES/DRESSINGS) ×1 IMPLANT
ELECT BLADE 6 FLAT ULTRCLN (ELECTRODE) ×1 IMPLANT
ELECT REM PT RETURN 9FT ADLT (ELECTROSURGICAL) ×2
ELECTRODE REM PT RTRN 9FT ADLT (ELECTROSURGICAL) ×1 IMPLANT
GLOVE SURG ENC MOIS LTX SZ6.5 (GLOVE) ×4 IMPLANT
GLOVE SURG SS PI 7.5 STRL IVOR (GLOVE) ×4 IMPLANT
GLOVE SURG UNDER POLY LF SZ6.5 (GLOVE) ×4 IMPLANT
GLOVE SURG UNDER POLY LF SZ7 (GLOVE) ×10 IMPLANT
GOWN STRL REUS W/TWL LRG LVL3 (GOWN DISPOSABLE) ×12 IMPLANT
HEMOSTAT SURGICEL 4X8 (HEMOSTASIS) ×1 IMPLANT
INST SET MAJOR GENERAL (KITS) ×2 IMPLANT
KIT BLADEGUARD II DBL (SET/KITS/TRAYS/PACK) ×1 IMPLANT
KIT TURNOVER KIT A (KITS) ×2 IMPLANT
MANIFOLD NEPTUNE II (INSTRUMENTS) ×2 IMPLANT
NDL HYPO 18GX1.5 BLUNT FILL (NEEDLE) ×1 IMPLANT
NEEDLE HYPO 18GX1.5 BLUNT FILL (NEEDLE) ×2 IMPLANT
NS IRRIG 1000ML POUR BTL (IV SOLUTION) ×4 IMPLANT
PACK COLON (CUSTOM PROCEDURE TRAY) ×2 IMPLANT
PAD ARMBOARD 7.5X6 YLW CONV (MISCELLANEOUS) ×2 IMPLANT
PENCIL SMOKE EVACUATOR COATED (MISCELLANEOUS) ×2 IMPLANT
RELOAD PROXIMATE 75MM BLUE (ENDOMECHANICALS) ×4 IMPLANT
RELOAD STAPLE 75 3.8 BLU REG (ENDOMECHANICALS) IMPLANT
RETRACTOR WND ALEXIS-O 25 LRG (MISCELLANEOUS) IMPLANT
RTRCTR WOUND ALEXIS O 25CM LRG (MISCELLANEOUS) ×2
SEALER TISSUE X1 CVD JAW (INSTRUMENTS) ×1 IMPLANT
SPONGE SURGIFOAM ABS GEL 100 (HEMOSTASIS) ×1 IMPLANT
SPONGE T-LAP 18X18 ~~LOC~~+RFID (SPONGE) ×5 IMPLANT
STAPLER GUN LINEAR PROX 60 (STAPLE) ×2 IMPLANT
STAPLER PROXIMATE 75MM BLUE (STAPLE) ×1 IMPLANT
STAPLER VISISTAT (STAPLE) ×2 IMPLANT
SUT CHROMIC 2 0 SH (SUTURE) ×1 IMPLANT
SUT PDS AB 0 CTX 60 (SUTURE) ×1 IMPLANT
SUT SILK 3 0 SH CR/8 (SUTURE) ×2 IMPLANT
TRAY FOLEY MTR SLVR 16FR STAT (SET/KITS/TRAYS/PACK) ×2 IMPLANT
YANKAUER SUCT BULB TIP 10FT TU (MISCELLANEOUS) ×2 IMPLANT

## 2021-07-06 NOTE — Progress Notes (Signed)
PROGRESS NOTE  PATT ANCAR [id number] DOB: Jan 27, 1946 DOA: 07/03/2021 PCP: Pcp, No  Brief History:  75 year old female with a history of atrial fibrillation, morbid obesity, diastolic CHF, hypertension, lymphedema presenting with constipation and rectal pain of 2 day duration.  The patient was recently admitted to [facility] from 06/28/2021 to 06/30/2021 for constipation.  During that admission, the patient was noted to have a hemoglobin of 5.6.  She was transfused with 2 units PRBC.  Her discharge hemoglobin was 8.  She had a CT of the abdomen and pelvis which showed cecal thickening raising questions of a possible cecal mass with small adjacent lymph nodes.  There is also skin thickening over her pannus.  The patient was given cathartics and ultimately had a bowel movement.  The patient was scheduled to have colonoscopy performed by general surgery, but she left AGAINST MEDICAL ADVICE prior to this being done.  An echocardiogram was performed on 06/28/2021 which showed EF 30%, mild to moderate MR, and normal RV. The patient states that she has not been fully compliant with all her prescription medications.  She has not taken her prescription medicines including her torsemide, Xarelto, and diltiazem since her discharge from [facility].  Even prior to that, patient states that she has not been fully compliant with her medications.  She states that she does not use any cathartics at home.  She denies any hematochezia, melena, hematemesis. In the emergency department, patient was afebrile and hemodynamically stable.  She was noted to have atrial fibrillation with RVR.  Oxygen saturation was 96% on room air.  BMP was unremarkable.  WBC 8.3, hemoglobin 7.8, platelets 265,000.  LFTs were unremarkable.   Assessment/Plan: Atrial fibrillation with RVR -Continue metoprolol succinate 25 mg bid -digoxin added by cardiology -06/29/21 Echo EF 30%, mild-mod MR -Soft blood pressures limit titration of her chronotropic  medications -Personally reviewed EKG--atrial fibrillation, nonspecific T wave change -holding rivaroxaban in preparation for surgery -cardiology consulted for cardiac clearance   Abnormal CT abdomen/cecal mass -GI consult appreciated--discussed with Dr. Abbey Chatters -06/28/2021 CT abdomen as discussed above -07/05/21 colonoscopy--partially obstructing cecal mass -general surgery consult -R-hemicolectomy 07/06/21--Dr. Arnoldo Morale   Situational Anxiety -started in IV ativan by Dr. Arnoldo Morale -minimize opioids as she became very somnolent with combo on 07/03/21   Iron deficiency anemia -06/29/2021 iron saturation 4, ferritin 5 -Holding iron supplementation until GI/surgical work-ups done   Morbid obesity -BMI 50.08 -Lifestyle modification   Hypothyroidism -Continue Synthroid   Lymphedema -Restart torsemide   Chronic systolic and Diastolic CHF -clinically euvolemic -restart home torsemide         Status is: Inpatient   The patient will require care spanning > 2 midnights and should be moved to inpatient because: Inpatient level of care appropriate due to severity of illness   Dispo: The patient is from: Home              Anticipated d/c is to: Home              Patient currently is not medically stable to d/c.              Difficult to place patient No               Family Communication:  no Family at bedside   Consultants:  GI   Code Status:  FULL    DVT Prophylaxis: Xarelto on hold     Procedures: As Listed in Progress Note Above  Antibiotics: None     Subjective: Patient sleepy but arousable after surgery.  Denies cp, sob.  No vomiting.  Remainder ROS unobtainable  Objective: Vitals:   07/06/21 1315 07/06/21 1330 07/06/21 1413 07/06/21 1416  BP: 129/88 128/84 (!) 145/107   Pulse: (!) 123 (!) 122 (!) 103   Resp: 16 (!) 9 13   Temp:      TempSrc:      SpO2:   100%   Weight:    109.3 kg  Height:        Intake/Output Summary (Last 24 hours) at 07/06/2021  1438 Last data filed at 07/06/2021 1353 Gross per 24 hour  Intake 1900 ml  Output 200 ml  Net 1700 ml   Weight change:  Exam:  General:  Pt is alert sleepy but awakens to voice, not in acute distress HEENT: No icterus, No thrush, No neck mass, [place]/AT Cardiovascular: IRRR, S1/S2, no rubs, no gallops Respiratory: bibasilar crackles. No wheeze Abdomen: Soft/+BS, non tender, non distended, no guarding Extremities: Nonpitting edema, No lymphangitis, No petechiae, No rashes, no synovitis   Data Reviewed: I have personally reviewed following labs and imaging studies Basic Metabolic Panel: Recent Labs  Lab 07/03/21 1342 07/04/21 0519 07/05/21 0549 07/06/21 0620  NA 135 136 135 136  K 3.8 3.7 3.7 3.7  CL 103 103 105 103  CO2 '27 27 24 26  '$ GLUCOSE 105* 83 75 74  BUN '13 11 8 '$ 7*  CREATININE 0.73 0.65 0.66 0.76  CALCIUM 8.3* 8.4* 8.3* 8.0*   Liver Function Tests: Recent Labs  Lab 07/03/21 1342  AST 22  ALT 19  ALKPHOS 104  BILITOT 1.3*  PROT 5.6*  ALBUMIN 2.7*   No results for input(s): LIPASE, AMYLASE in the last 168 hours. No results for input(s): AMMONIA in the last 168 hours. Coagulation Profile: No results for input(s): INR, PROTIME in the last 168 hours. CBC: Recent Labs  Lab 07/03/21 1342 07/04/21 0519 07/05/21 0549 07/06/21 0620  WBC 8.3 6.5 6.6 6.6  NEUTROABS 5.9  --   --   --   HGB 7.8* 7.8* 7.6* 7.5*  HCT 28.3* 28.9* 27.9* 27.6*  MCV 75.3* 75.9* 75.0* 74.2*  PLT 253 265 249 228   Cardiac Enzymes: No results for input(s): CKTOTAL, CKMB, CKMBINDEX, TROPONINI in the last 168 hours. BNP: Invalid input(s): POCBNP CBG: Recent Labs  Lab 07/04/21 0755 07/04/21 1131 07/05/21 0732  GLUCAP 85 83 78   HbA1C: No results for input(s): HGBA1C in the last 72 hours. Urine analysis:    Component Value Date/Time   COLORURINE YELLOW 03/26/2014 1346   APPEARANCEUR CLEAR 03/26/2014 1346   LABSPEC 1.011 03/26/2014 1346   PHURINE 5.0 03/26/2014 1346   GLUCOSEU  NEGATIVE 03/26/2014 1346   HGBUR MODERATE (A) 03/26/2014 1346   BILIRUBINUR NEGATIVE 03/26/2014 1346   KETONESUR NEGATIVE 03/26/2014 1346   PROTEINUR NEGATIVE 03/26/2014 1346   UROBILINOGEN 0.2 03/26/2014 1346   NITRITE NEGATIVE 03/26/2014 1346   LEUKOCYTESUR NEGATIVE 03/26/2014 1346   Sepsis Labs: '@LABRCNTIP'$ (procalcitonin:4,lacticidven:4) ) Recent Results (from the past 240 hour(s))  SARS CORONAVIRUS 2 (Roena Sassaman 6-24 HRS) Nasopharyngeal Nasopharyngeal Swab     Status: None   Collection Time: 07/03/21  7:45 PM   Specimen: Nasopharyngeal Swab  Result Value Ref Range Status   SARS Coronavirus 2 NEGATIVE NEGATIVE Final    Comment: (NOTE) SARS-CoV-2 target nucleic acids are NOT DETECTED.  The SARS-CoV-2 RNA is generally detectable in upper and lower respiratory specimens during the acute phase of  infection. Negative results do not preclude SARS-CoV-2 infection, do not rule out co-infections with other pathogens, and should not be used as the sole basis for treatment or other patient management decisions. Negative results must be combined with clinical observations, patient history, and epidemiological information. The expected result is Negative.  Fact Sheet for Patients: SugarRoll.be  Fact Sheet for Healthcare Providers: https://www.woods-mathews.com/  This test is not yet approved or cleared by the [place] FDA and  has been authorized for detection and/or diagnosis of SARS-CoV-2 by FDA under an Emergency Use Authorization (EUA). This EUA will remain  in effect (meaning this test can be used) for the duration of the COVID-19 declaration under Se ction 564(b)(1) of the Act, 21 U.S.C. section 360bbb-3(b)(1), unless the authorization is terminated or revoked sooner.  Performed at [facility], [place] [address] [place], [place] [address]   Surgical PCR screen     Status: None   Collection Time: 07/05/21  3:20 PM   Specimen: Nasal  Mucosa; Nasal Swab  Result Value Ref Range Status   MRSA, PCR NEGATIVE NEGATIVE Final   Staphylococcus aureus NEGATIVE NEGATIVE Final    Comment: (NOTE) The Xpert SA Assay (FDA approved for NASAL specimens in patients 88 years of age and older), is one component of a comprehensive surveillance program. It is not intended to diagnose infection nor to guide or monitor treatment. Performed at [facility], [address] [place], [place] [address]      Scheduled Meds:  alvimopan       Chlorhexidine Gluconate Cloth  6 each Topical Daily   digoxin  0.25 mg Oral Daily   [START ON 07/07/2021] enoxaparin (LOVENOX) injection  40 mg Subcutaneous Q24H   metoprolol succinate  25 mg Oral BID   mupirocin ointment  1 application Nasal BID   pantoprazole  40 mg Oral Daily   torsemide  20 mg Oral Daily   Continuous Infusions:  sodium chloride 50 mL/hr at 07/06/21 1419    Procedures/Studies: DG Abdomen 1 View  Result Date: 07/03/2021 CLINICAL DATA:  Constipation EXAM: ABDOMEN - 1 VIEW COMPARISON:  CT pelvis June 28, 2021 FINDINGS: The bowel gas pattern is normal. Moderate volume of formed stool throughout the colon. Degenerative changes bilateral hips. Lumbar spondylosis. IMPRESSION: Nonobstructive bowel gas pattern. Moderate volume of formed stool throughout the colon. Electronically Signed   By: Dahlia Bailiff MD   On: 07/03/2021 17:45    Orson Eva, DO  [facility]  If 7PM-7AM, please contact night-coverage [email or web address] Password [facility] 07/06/2021, 2:38 PM   LOS: 0 days

## 2021-07-06 NOTE — Progress Notes (Signed)
Patient alert/oriented. Eager/Anxious for surgery. Consent signed and on the chart, preop documentation complete.

## 2021-07-06 NOTE — Interval H&P Note (Signed)
History and Physical Interval Note:  07/06/2021 10:24 AM  Lauren [name]  has presented today for surgery, with the diagnosis of cecal neoplasm.  The various methods of treatment have been discussed with the patient and family. After consideration of risks, benefits and other options for treatment, the patient has consented to  Procedure(s): PARTIAL COLECTOMY (N/A) as a surgical intervention.  The patient's history has been reviewed, patient examined, no change in status, stable for surgery.  I have reviewed the patient's chart and labs.  Questions were answered to the patient's satisfaction.     [name] Signs

## 2021-07-06 NOTE — Progress Notes (Signed)
Progress Note  Patient Name: Lauren [name] Date of Encounter: 07/06/2021  [facility] Cardiologist: Carlyle Dolly, MD   Subjective  Just beginning to wake up from anesthesia. Denied discomfort and no c/o SOB. Then drifted off to sleep.  Inpatient Medications    Scheduled Meds:  alvimopan       Chlorhexidine Gluconate Cloth  6 each Topical Daily   digoxin  0.25 mg Oral Daily   [START ON 07/07/2021] enoxaparin (LOVENOX) injection  40 mg Subcutaneous Q24H   [START ON 07/07/2021] levothyroxine  50 mcg Intravenous Daily   metoprolol succinate  25 mg Oral BID   mupirocin ointment  1 application Nasal BID   pantoprazole  40 mg Oral Daily   torsemide  20 mg Oral Daily   Continuous Infusions:  sodium chloride 50 mL/hr at 07/06/21 1505   PRN Meds: acetaminophen **OR** acetaminophen, [COMPLETED] ketorolac **FOLLOWED BY** ketorolac, LORazepam, metoprolol tartrate, morphine injection, ondansetron **OR** ondansetron (ZOFRAN) IV, simethicone, traMADol   Vital Signs    Vitals:   07/06/21 1330 07/06/21 1413 07/06/21 1416 07/06/21 1434  BP: 128/84 (!) 145/107  129/70  Pulse: (!) 122 (!) 103  (!) 48  Resp: (!) 9 13  (!) 8  Temp:      TempSrc:      SpO2:  100%  100%  Weight:   109.3 kg   Height:        Intake/Output Summary (Last 24 hours) at 07/06/2021 1535 Last data filed at 07/06/2021 1505 Gross per 24 hour  Intake 1938.06 ml  Output 200 ml  Net 1738.06 ml   Last 3 Weights 07/06/2021 07/03/2021 07/03/2021  Weight (lbs) 240 lb 15.4 oz 273 lb 13 oz 270 lb  Weight (kg) 109.3 kg 124.2 kg 122.471 kg      Telemetry    Afib with inc HR 120-130s at times. - Personally Reviewed   Physical Exam  Sleepy but arousable, NAD,  sl pale appearing. Neck: No obvious JVD GEN: No acute distress.   Cardiac: irreg and sl fast, no murmurs, rubs, or gallops.  Respiratory: Clear to auscultation bilaterally.listened anteriroly GI: quiet abd. [id number] legs , minimal puffiness of  ankles Neuro:  Nonfocal    Labs    High Sensitivity Troponin:  No results for input(s): TROPONINIHS in the last 720 hours.    Chemistry Recent Labs  Lab 07/03/21 1342 07/04/21 0519 07/05/21 0549 07/06/21 0620  NA 135 136 135 136  K 3.8 3.7 3.7 3.7  CL 103 103 105 103  CO2 '27 27 24 26  '$ GLUCOSE 105* 83 75 74  BUN '13 11 8 '$ 7*  CREATININE 0.73 0.65 0.66 0.76  CALCIUM 8.3* 8.4* 8.3* 8.0*  PROT 5.6*  --   --   --   ALBUMIN 2.7*  --   --   --   AST 22  --   --   --   ALT 19  --   --   --   ALKPHOS 104  --   --   --   BILITOT 1.3*  --   --   --   GFRNONAA >60 >60 >60 >60  ANIONGAP '5 6 6 7     '$ Hematology Recent Labs  Lab 07/04/21 0519 07/05/21 0549 07/06/21 0620  WBC 6.5 6.6 6.6  RBC 3.81* 3.72* 3.72*  HGB 7.8* 7.6* 7.5*  HCT 28.9* 27.9* 27.6*  MCV 75.9* 75.0* 74.2*  MCH 20.5* 20.4* 20.2*  MCHC 27.0* 27.2* 27.2*  RDW 24.0* 23.9*  23.7*  PLT 265 249 228    BNPNo results for input(s): BNP, PROBNP in the last 168 hours.   DDimer No results for input(s): DDIMER in the last 168 hours.   Radiology    No results found.    Assessment & Plan     Chronic atrial fibrillation          recent issues with rate control due to noncompliance as well as her significant anemia initially presented with a hemoglobin just over 5 for which she received a transfusion with current hemoglobin 7.6 2.  Left ventricular systolic dysfunction, significant suspect in part related to lack of medication with inadequate ventricular rate control. 3.  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  Chronic lymphedema 5  Thyroid Disease- euthyroid on replacement (labs at [facility])  Recommendation. Will give IV dig this PM as even though oral metoprolol ordered unlikely to be absorbed normally post major intra abd surgery. Depending on her HR over the weekend may need additional IV dig till GI tract returns to normal. So far she has received only 0.25 mg dig so far from  loading dose.I spoke to nursing. Watch Hgb and try to avoid volume overloading. For questions or updates, please contact [place] Please consult [email or web address] for contact info under        Signed, Abel Presto, MD  07/06/2021, 3:35 PM

## 2021-07-06 NOTE — Plan of Care (Addendum)
Patient with admitting diagnosis of AF w/RVR; now S/P ABD surgery. Cardiiac rhythm is AF with V-rate 100-120; BP WNL and stable; not requiring cardioactive infusion.   Edit: soft BP after IV ativan overnight.   Problem: Education: Goal: Knowledge of General Education information will improve Description: Including pain rating scale, medication(s)/side effects and non-pharmacologic comfort measures Outcome: Progressing   Problem: Health Behavior/Discharge Planning: Goal: Ability to manage health-related needs will improve Outcome: Progressing   Problem: Clinical Measurements: Goal: Ability to maintain clinical measurements within normal limits will improve Outcome: Progressing Goal: Will remain free from infection Outcome: Progressing Goal: Diagnostic test results will improve Outcome: Progressing Goal: Respiratory complications will improve Outcome: Progressing Goal: Cardiovascular complication will be avoided Outcome: Progressing   Problem: Activity: Goal: Risk for activity intolerance will decrease Outcome: Progressing   Problem: Nutrition: Goal: Adequate nutrition will be maintained Outcome: Progressing   Problem: Coping: Goal: Level of anxiety will decrease Outcome: Progressing   Problem: Elimination: Goal: Will not experience complications related to bowel motility Outcome: Progressing Goal: Will not experience complications related to urinary retention Outcome: Progressing   Problem: Pain Managment: Goal: General experience of comfort will improve Outcome: Progressing   Problem: Safety: Goal: Ability to remain free from injury will improve Outcome: Progressing   Problem: Skin Integrity: Goal: Risk for impaired skin integrity will decrease Outcome: Progressing

## 2021-07-06 NOTE — Op Note (Signed)
Patient:  Lauren [name]  DOB:  May 17, 1946  MRN:  [id number]   Preop Diagnosis: Ascending colon carcinoma  Postop Diagnosis: Same  Procedure: Right hemicolectomy  Surgeon: [name] Signs, MD  Anes: General endotracheal  Indications: Patient is a 75 year old white female who was found recently on colonoscopy to have a near obstructing colon neoplasm at the level of the cecum.  The risks and benefits of the procedure including bleeding, infection, cardiopulmonary difficulties, anastomotic leak, and the possibility of a blood transfusion were fully explained to the patient, who gave informed consent.  Procedure note: The patient was placed in supine position.  After induction of general endotracheal anesthesia, the abdomen was prepped and draped using the usual sterile technique with ChloraPrep.  Surgical site confirmation was performed.  An incision was made in the midline around the umbilicus.  The peritoneal cavity was entered into without difficulty.  There was omentum incarcerated into the umbilical hernia with a hard mass present.  This was excised using the Enseal and sent to pathology for further examination.  The liver was palpated and no definitive dominant mass was noted.  The right colon was mobilized along its peritoneal reflection.  The hepatic flexure was taken down using the Enseal.  A GIA stapler was placed across the terminal ileum and fired.  This was likewise done across the proximal transverse colon.  This location was proximal to the middle colic artery.  The right colon mesentery was then divided using the Enseal.  Care was taken to avoid the duodenum.  The specimen was then sent to pathology for further examination.  A side to side ileocolic anastomosis was performed using a GIA 75 stapler.  The enterotomy was closed using a TA 60 stapler.  The staple line was bolstered using 3-0 silk Lembert sutures.  Surrounding omentum was placed over the anastomosis and secured using 3-0  silk suture.  The mesenteric defect was closed using a 2-0 Chromic Gut running suture.  The abdominal cavity was then copiously irrigated with normal saline.  Surgicel and Gelfoam were placed along the right paracolic gutter.  The bowel was returned into the abdominal cavity in an orderly fashion.  All operating personnel then changed their gown and gloves.  A new set up was used for closure.  The fascia was reapproximated using a looped 0 PDS running suture.  The subcutaneous layer was irrigated with normal saline.  Exparel was instilled into the surrounding wound.  The skin was closed using staples.  Betadine ointment and dry sterile dressing were applied.  All tape and needle counts were correct at the end of the procedure.  The patient was extubated in the operating room and transferred to PACU in stable condition.  Complications: None  EBL: 50 cc  Specimen: Right colon

## 2021-07-06 NOTE — Anesthesia Preprocedure Evaluation (Signed)
Anesthesia Evaluation  Patient identified by MRN, date of birth, ID band Patient awake    Reviewed: Allergy & Precautions, NPO status , Patient's Chart, lab work & pertinent test results  Airway Mallampati: II  TM Distance: <3 FB Neck ROM: Full    Dental  (+) Loose, Dental Advisory Given, Chipped, Poor Dentition Broken:   Pulmonary neg pulmonary ROS,    Pulmonary exam normal        Cardiovascular hypertension, +CHF  Normal cardiovascular exam+ dysrhythmias Atrial Fibrillation      Neuro/Psych Anxiety negative neurological ROS     GI/Hepatic negative GI ROS, Neg liver ROS,   Endo/Other  Hypothyroidism   Renal/GU negative Renal ROS  negative genitourinary   Musculoskeletal negative musculoskeletal ROS (+)   Abdominal   Peds negative pediatric ROS (+)  Hematology negative hematology ROS (+) anemia ,   Anesthesia Other Findings Cecal mass/cancer  Super morbid obesity Pt had stopped her xarelto for greater than a week  Reproductive/Obstetrics negative OB ROS                             Anesthesia Physical  Anesthesia Plan  ASA: 3  Anesthesia Plan: General   Post-op Pain Management:    Induction: Intravenous  PONV Risk Score and Plan: Ondansetron  Airway Management Planned: Oral ETT  Additional Equipment:   Intra-op Plan:   Post-operative Plan:   Informed Consent: I have reviewed the patients History and Physical, chart, labs and discussed the procedure including the risks, benefits and alternatives for the proposed anesthesia with the patient or authorized representative who has indicated his/her understanding and acceptance.       Plan Discussed with: CRNA  Anesthesia Plan Comments:         Anesthesia Quick Evaluation

## 2021-07-06 NOTE — Anesthesia Procedure Notes (Signed)
Procedure Name: Intubation Date/Time: 07/06/2021 10:53 AM Performed by: Orlie Dakin, CRNA Pre-anesthesia Checklist: Patient identified, Emergency Drugs available, Suction available and Patient being monitored Patient Re-evaluated:Patient Re-evaluated prior to induction Oxygen Delivery Method: Circle system utilized Preoxygenation: Pre-oxygenation with 100% oxygen Induction Type: IV induction Ventilation: Mask ventilation without difficulty Laryngoscope Size: Miller and 3 Grade View: Grade II Tube type: Oral Tube size: 7.5 mm Number of attempts: 1 Airway Equipment and Method: Stylet Placement Confirmation: ETT inserted through vocal cords under direct vision, positive ETCO2 and breath sounds checked- equal and bilateral Secured at: 22 cm Tube secured with: Tape Dental Injury: Teeth and Oropharynx as per pre-operative assessment  Comments: Very poor dentition noted in Pre-op, loose upper front 2 teeth per patient.  Many missing and broken off.

## 2021-07-06 NOTE — Anesthesia Postprocedure Evaluation (Signed)
Anesthesia Post Note  Patient: Lauren [name]  Procedure(s) Performed: RIGHT HEMICOLECTOMY (Abdomen)  Patient location during evaluation: PACU Anesthesia Type: General Level of consciousness: awake and alert Pain management: pain level controlled Vital Signs Assessment: post-procedure vital signs reviewed and stable Respiratory status: spontaneous breathing, nonlabored ventilation, respiratory function stable and patient connected to nasal cannula oxygen Cardiovascular status: blood pressure returned to baseline and stable Postop Assessment: no apparent nausea or vomiting Anesthetic complications: no   No notable events documented.   Last Vitals:  Vitals:   07/06/21 1238 07/06/21 1245  BP: (!) 152/100 (!) 140/101  Pulse: 77 (!) 137  Resp: (!) 23 (!) 26  Temp: (P) 36.6 C   SpO2: (!) (P) 88%     Last Pain:  Vitals:   07/06/21 0935  TempSrc: Oral  PainSc: 0-No pain                 Nicanor Alcon

## 2021-07-06 NOTE — Transfer of Care (Signed)
Immediate Anesthesia Transfer of Care Note  Patient: Lauren [name]  Procedure(s) Performed: RIGHT HEMICOLECTOMY (Abdomen)  Patient Location: PACU  Anesthesia Type:General  Level of Consciousness: awake and oriented  Airway & Oxygen Therapy: Patient Spontanous Breathing and Patient connected to face mask oxygen  Post-op Assessment: Report given to RN, Post -op Vital signs reviewed and stable and Patient moving all extremities X 4  Post vital signs: Reviewed and stable  Last Vitals:  Vitals Value Taken Time  BP 128/89 07/06/21 1240  Temp    Pulse 148 07/06/21 1242  Resp 21 07/06/21 1242  SpO2 100 % 07/06/21 1242  Vitals shown include unvalidated device data.  Last Pain:  Vitals:   07/06/21 0935  TempSrc: Oral  PainSc: 0-No pain      Patients Stated Pain Goal: 7 ([id number])  Complications: No notable events documented.

## 2021-07-07 DIAGNOSIS — I890 Lymphedema, not elsewhere classified: Secondary | ICD-10-CM | POA: Diagnosis not present

## 2021-07-07 DIAGNOSIS — I4891 Unspecified atrial fibrillation: Secondary | ICD-10-CM | POA: Diagnosis not present

## 2021-07-07 DIAGNOSIS — I5042 Chronic combined systolic (congestive) and diastolic (congestive) heart failure: Secondary | ICD-10-CM | POA: Diagnosis not present

## 2021-07-07 LAB — CBC
HCT: 29 % — ABNORMAL LOW (ref 36.0–46.0)
Hemoglobin: 7.8 g/dL — ABNORMAL LOW (ref 12.0–15.0)
MCH: 20.5 pg — ABNORMAL LOW (ref 26.0–34.0)
MCHC: 26.9 g/dL — ABNORMAL LOW (ref 30.0–36.0)
MCV: 76.1 fL — ABNORMAL LOW (ref 80.0–100.0)
Platelets: 231 10*3/uL (ref 150–400)
RBC: 3.81 MIL/uL — ABNORMAL LOW (ref 3.87–5.11)
RDW: 23.3 % — ABNORMAL HIGH (ref 11.5–15.5)
WBC: 10.3 10*3/uL (ref 4.0–10.5)
nRBC: 0 % (ref 0.0–0.2)

## 2021-07-07 LAB — BASIC METABOLIC PANEL
Anion gap: 3 — ABNORMAL LOW (ref 5–15)
BUN: 9 mg/dL (ref 8–23)
CO2: 26 mmol/L (ref 22–32)
Calcium: 8.4 mg/dL — ABNORMAL LOW (ref 8.9–10.3)
Chloride: 106 mmol/L (ref 98–111)
Creatinine, Ser: 0.85 mg/dL (ref 0.44–1.00)
GFR, Estimated: >60 mL/min (ref >60)
Glucose, Bld: 133 mg/dL — ABNORMAL HIGH (ref 70–99)
Potassium: 4.2 mmol/L (ref 3.5–5.1)
Sodium: 135 mmol/L (ref 135–145)

## 2021-07-07 LAB — MAGNESIUM: Magnesium: 1.7 mg/dL (ref 1.7–2.4)

## 2021-07-07 LAB — PHOSPHORUS: Phosphorus: 3.3 mg/dL (ref 2.5–4.6)

## 2021-07-07 MED ORDER — LORAZEPAM 2 MG/ML IJ SOLN
0.5000 mg | Freq: Three times a day (TID) | INTRAMUSCULAR | Status: DC | PRN
  Administered 2021-07-07: 0.5 mg via INTRAVENOUS
  Filled 2021-07-07: qty 1

## 2021-07-07 MED ORDER — MAGNESIUM SULFATE 2 GM/50ML IV SOLN
2.0000 g | Freq: Once | INTRAVENOUS | Status: AC
  Administered 2021-07-07: 2 g via INTRAVENOUS
  Filled 2021-07-07: qty 50

## 2021-07-07 NOTE — Progress Notes (Signed)
1 Day Post-Op  Subjective: Patient complained of mild incisional pain.  She denies any nausea or vomiting.  Objective: Vital signs in last 24 hours: Temp:  [97.4 F (36.3 C)-98 F (36.7 C)] 97.5 F (36.4 C) (08/13 0722) Pulse Rate:  [48-140] 90 (08/13 0722) Resp:  [5-26] 13 (08/13 0722) BP: (99-152)/(36-107) 102/43 (08/13 0500) SpO2:  [88 %-100 %] 95 % (08/13 0722) Weight:  [109.3 kg] 109.3 kg (08/12 1416) Last BM Date: 07/05/21  Intake/Output from previous day: 08/12 0701 - 08/13 0700 In: 3063.1 [P.O.:480; I.V.:2383.1; IV Piggyback:200] Out: 280 [Urine:230; Blood:50] Intake/Output this shift: No intake/output data recorded.  General appearance: alert, cooperative, and no distress Resp: clear to auscultation bilaterally Cardio: regularly irregular rhythm GI: Soft, incision healing well.  Occasional bowel sounds appreciated.  Lab Results:  Recent Labs    07/06/21 0620 07/07/21 0341  WBC 6.6 10.3  HGB 7.5* 7.8*  HCT 27.6* 29.0*  PLT 228 231   BMET Recent Labs    07/06/21 0620 07/07/21 0341  NA 136 135  K 3.7 4.2  CL 103 106  CO2 26 26  GLUCOSE 74 133*  BUN 7* 9  CREATININE 0.76 0.85  CALCIUM 8.0* 8.4*   PT/INR No results for input(s): LABPROT, INR in the last 72 hours.  Studies/Results: No results found.  Anti-infectives: Anti-infectives (From admission, onward)    Start     Dose/Rate Route Frequency Ordered Stop   07/06/21 1200  ertapenem (INVANZ) 1,000 mg in sodium chloride 0.9 % 100 mL IVPB        1 g 200 mL/hr over 30 Minutes Intravenous On call to O.R. 07/05/21 1453 07/06/21 1113   07/06/21 0941  sodium chloride 0.9 % with ertapenem [facility]) ADS Med       Note to Pharmacy: Augustin Schooling   : cabinet override      07/06/21 0941 07/06/21 1058   07/05/21 1600  neomycin (MYCIFRADIN) tablet 1,000 mg  Status:  Discontinued        1,000 mg Oral Every 4 hours 07/05/21 1438 07/06/21 0359   07/05/21 1530  metroNIDAZOLE (FLAGYL) IVPB 500 mg         500 mg 100 mL/hr over 60 Minutes Intravenous Every 4 hours 07/05/21 1438 07/06/21 0126       Assessment/Plan: s/p Procedure(s): RIGHT HEMICOLECTOMY Impression: Stable on postoperative day 1.  Does not clinically appear to be in fluid overload.  Tolerating clear liquid diet well.  Hemoglobin stable. Plan: We will advance to full liquid diet.  We will reassess labs in the morning.  May start anticoagulant tomorrow if hemoglobin is stable.  Patient is amenable to a skilled nursing unit for rehabilitation upon discharge.  LOS: 1 day    [name] Signs 07/07/2021

## 2021-07-07 NOTE — Progress Notes (Signed)
PROGRESS NOTE  Lauren [name] [id number] DOB: 01/14/1946 DOA: 07/03/2021 PCP: Pcp, No  Brief History:  75 year old female with a history of atrial fibrillation, morbid obesity, diastolic CHF, hypertension, lymphedema presenting with constipation and rectal pain of 2 day duration.  The patient was recently admitted to [facility] from 06/28/2021 to 06/30/2021 for constipation.  During that admission, the patient was noted to have a hemoglobin of 5.6.  She was transfused with 2 units PRBC.  Her discharge hemoglobin was 8.  She had a CT of the abdomen and pelvis which showed cecal thickening raising questions of a possible cecal mass with small adjacent lymph nodes.  There is also skin thickening over her pannus.  The patient was given cathartics and ultimately had a bowel movement.  The patient was scheduled to have colonoscopy performed by general surgery, but she left AGAINST MEDICAL ADVICE prior to this being done.  An echocardiogram was performed on 06/28/2021 which showed EF 30%, mild to moderate MR, and normal RV. The patient states that she has not been fully compliant with all her prescription medications.  She has not taken her prescription medicines including her torsemide, Xarelto, and diltiazem since her discharge from [facility].  Even prior to that, patient states that she has not been fully compliant with her medications.  She states that she does not use any cathartics at home.  She denies any hematochezia, melena, hematemesis. In the emergency department, patient was afebrile and hemodynamically stable.  She was noted to have atrial fibrillation with RVR.  Oxygen saturation was 96% on room air.  BMP was unremarkable.  WBC 8.3, hemoglobin 7.8, platelets 265,000.  LFTs were unremarkable.   Assessment/Plan: Atrial fibrillation with RVR -Continue metoprolol succinate 25 mg bid -digoxin added by cardiology -06/29/21 Echo EF 30%, mild-mod MR -Soft blood pressures limit titration of her chronotropic  medications -Personally reviewed EKG--atrial fibrillation, nonspecific T wave change -holding rivaroxaban in preparation for surgery -cardiology consult appreciated   Abnormal CT abdomen/cecal mass -GI consult appreciated--discussed with Dr. Abbey Chatters -06/28/2021 CT abdomen as discussed above -07/05/21 colonoscopy--partially obstructing cecal mass -general surgery consult -R-hemicolectomy 07/06/21--Dr. Arnoldo Morale -discussed with Dr. Aletha Halim to full liquids   Situational Anxiety -started in IV ativan by Dr. Arnoldo Morale -minimize opioids as she became very somnolent with combo on 07/03/21   Iron deficiency anemia -06/29/2021 iron saturation 4, ferritin 5 -Holding iron supplementation until GI/surgical work-ups done   Morbid obesity -BMI 50.08 -Lifestyle modification   Hypothyroidism -Continue Synthroid   Lymphedema -Restart torsemide   Chronic systolic and Diastolic CHF -clinically euvolemic -restart home torsemide         Status is: Inpatient   The patient will require care spanning > 2 midnights and should be moved to inpatient because: Inpatient level of care appropriate due to severity of illness   Dispo: The patient is from: Home              Anticipated d/c is to: Home              Patient currently is not medically stable to d/c.              Difficult to place patient No               Family Communication:  no Family at bedside   Consultants:  GI, general surgery   Code Status:  FULL    DVT Prophylaxis: Xarelto on hold  Procedures: As Listed in Progress Note Above   Antibiotics: None       Subjective: Patient complains of abdominal pain post op.  Denies cp, sob, n/v/d.  No BM but passing flatus.  Objective: Vitals:   07/07/21 0500 07/07/21 0600 07/07/21 0722 07/07/21 1124  BP: (!) 102/43     Pulse: 75 61 90 70  Resp: '12 12 13 16  '$ Temp:   (!) 97.5 F (36.4 C) (!) 97.4 F (36.3 C)  TempSrc:   Oral Axillary  SpO2: 94% 93% 95% 95%   Weight:      Height:        Intake/Output Summary (Last 24 hours) at 07/07/2021 1439 Last data filed at 07/07/2021 0500 Gross per 24 hour  Intake 1163.11 ml  Output 80 ml  Net 1083.11 ml   Weight change:  Exam:  General:  Pt is somnolent but awakens to voice and answers questions HEENT: No icterus, No thrush, No neck mass, [place]/AT Cardiovascular: RRR, S1/S2, no rubs, no gallops Respiratory: fine bibasilar crackles. No wheeze Abdomen: Soft/+BS, non tender, non distended, no guarding Extremities: Nonpitting edema, No lymphangitis, No petechiae, No rashes, no synovitis   Data Reviewed: I have personally reviewed following labs and imaging studies Basic Metabolic Panel: Recent Labs  Lab 07/03/21 1342 07/04/21 0519 07/05/21 0549 07/06/21 0620 07/07/21 0341  NA 135 136 135 136 135  K 3.8 3.7 3.7 3.7 4.2  CL 103 103 105 103 106  CO2 '27 27 24 26 26  '$ GLUCOSE 105* 83 75 74 133*  BUN '13 11 8 '$ 7* 9  CREATININE 0.73 0.65 0.66 0.76 0.85  CALCIUM 8.3* 8.4* 8.3* 8.0* 8.4*  MG  --   --   --   --  1.7  PHOS  --   --   --   --  3.3   Liver Function Tests: Recent Labs  Lab 07/03/21 1342  AST 22  ALT 19  ALKPHOS 104  BILITOT 1.3*  PROT 5.6*  ALBUMIN 2.7*   No results for input(s): LIPASE, AMYLASE in the last 168 hours. No results for input(s): AMMONIA in the last 168 hours. Coagulation Profile: No results for input(s): INR, PROTIME in the last 168 hours. CBC: Recent Labs  Lab 07/03/21 1342 07/04/21 0519 07/05/21 0549 07/06/21 0620 07/07/21 0341  WBC 8.3 6.5 6.6 6.6 10.3  NEUTROABS 5.9  --   --   --   --   HGB 7.8* 7.8* 7.6* 7.5* 7.8*  HCT 28.3* 28.9* 27.9* 27.6* 29.0*  MCV 75.3* 75.9* 75.0* 74.2* 76.1*  PLT 253 265 249 228 231   Cardiac Enzymes: No results for input(s): CKTOTAL, CKMB, CKMBINDEX, TROPONINI in the last 168 hours. BNP: Invalid input(s): POCBNP CBG: Recent Labs  Lab 07/04/21 0755 07/04/21 1131 07/05/21 0732  GLUCAP 85 83 78   HbA1C: No  results for input(s): HGBA1C in the last 72 hours. Urine analysis:    Component Value Date/Time   COLORURINE YELLOW 03/26/2014 1346   APPEARANCEUR CLEAR 03/26/2014 1346   LABSPEC 1.011 03/26/2014 1346   PHURINE 5.0 03/26/2014 1346   GLUCOSEU NEGATIVE 03/26/2014 1346   HGBUR MODERATE (A) 03/26/2014 1346   BILIRUBINUR NEGATIVE 03/26/2014 1346   KETONESUR NEGATIVE 03/26/2014 1346   PROTEINUR NEGATIVE 03/26/2014 1346   UROBILINOGEN 0.2 03/26/2014 1346   NITRITE NEGATIVE 03/26/2014 1346   LEUKOCYTESUR NEGATIVE 03/26/2014 1346   Sepsis Labs: '@LABRCNTIP'$ (procalcitonin:4,lacticidven:4) ) Recent Results (from the past 240 hour(s))  SARS CORONAVIRUS 2 (Madeeha Costantino 6-24 HRS) Nasopharyngeal Nasopharyngeal  Swab     Status: None   Collection Time: 07/03/21  7:45 PM   Specimen: Nasopharyngeal Swab  Result Value Ref Range Status   SARS Coronavirus 2 NEGATIVE NEGATIVE Final    Comment: (NOTE) SARS-CoV-2 target nucleic acids are NOT DETECTED.  The SARS-CoV-2 RNA is generally detectable in upper and lower respiratory specimens during the acute phase of infection. Negative results do not preclude SARS-CoV-2 infection, do not rule out co-infections with other pathogens, and should not be used as the sole basis for treatment or other patient management decisions. Negative results must be combined with clinical observations, patient history, and epidemiological information. The expected result is Negative.  Fact Sheet for Patients: SugarRoll.be  Fact Sheet for Healthcare Providers: https://www.woods-mathews.com/  This test is not yet approved or cleared by the [place] FDA and  has been authorized for detection and/or diagnosis of SARS-CoV-2 by FDA under an Emergency Use Authorization (EUA). This EUA will remain  in effect (meaning this test can be used) for the duration of the COVID-19 declaration under Se ction 564(b)(1) of the Act, 21 U.S.C. section  360bbb-3(b)(1), unless the authorization is terminated or revoked sooner.  Performed at [facility], [place] [address] [place], [place] [address]   Surgical PCR screen     Status: None   Collection Time: 07/05/21  3:20 PM   Specimen: Nasal Mucosa; Nasal Swab  Result Value Ref Range Status   MRSA, PCR NEGATIVE NEGATIVE Final   Staphylococcus aureus NEGATIVE NEGATIVE Final    Comment: (NOTE) The Xpert SA Assay (FDA approved for NASAL specimens in patients 78 years of age and older), is one component of a comprehensive surveillance program. It is not intended to diagnose infection nor to guide or monitor treatment. Performed at [facility], [address] [place], [place] [address]      Scheduled Meds:  Chlorhexidine Gluconate Cloth  6 each Topical Daily   digoxin  0.25 mg Oral Daily   enoxaparin (LOVENOX) injection  40 mg Subcutaneous Q24H   levothyroxine  50 mcg Intravenous Daily   metoprolol succinate  25 mg Oral BID   mupirocin ointment  1 application Nasal BID   pantoprazole  40 mg Oral Daily   torsemide  20 mg Oral Daily   Continuous Infusions:  sodium chloride 50 mL/hr at 07/07/21 1124    Procedures/Studies: DG Abdomen 1 View  Result Date: 07/03/2021 CLINICAL DATA:  Constipation EXAM: ABDOMEN - 1 VIEW COMPARISON:  CT pelvis June 28, 2021 FINDINGS: The bowel gas pattern is normal. Moderate volume of formed stool throughout the colon. Degenerative changes bilateral hips. Lumbar spondylosis. IMPRESSION: Nonobstructive bowel gas pattern. Moderate volume of formed stool throughout the colon. Electronically Signed   By: Dahlia Bailiff MD   On: 07/03/2021 17:45    Orson Eva, DO  [facility]  If 7PM-7AM, please contact night-coverage [email or web address] Password [facility] 07/07/2021, 2:39 PM   LOS: 1 day

## 2021-07-08 DIAGNOSIS — I5042 Chronic combined systolic (congestive) and diastolic (congestive) heart failure: Secondary | ICD-10-CM | POA: Diagnosis not present

## 2021-07-08 DIAGNOSIS — I890 Lymphedema, not elsewhere classified: Secondary | ICD-10-CM | POA: Diagnosis not present

## 2021-07-08 DIAGNOSIS — I4891 Unspecified atrial fibrillation: Secondary | ICD-10-CM | POA: Diagnosis not present

## 2021-07-08 LAB — BASIC METABOLIC PANEL
Anion gap: 8 (ref 5–15)
BUN: 10 mg/dL (ref 8–23)
CO2: 27 mmol/L (ref 22–32)
Calcium: 8.2 mg/dL — ABNORMAL LOW (ref 8.9–10.3)
Chloride: 102 mmol/L (ref 98–111)
Creatinine, Ser: 0.85 mg/dL (ref 0.44–1.00)
GFR, Estimated: >60 mL/min (ref >60)
Glucose, Bld: 77 mg/dL (ref 70–99)
Potassium: 3.8 mmol/L (ref 3.5–5.1)
Sodium: 137 mmol/L (ref 135–145)

## 2021-07-08 LAB — CBC
HCT: 28.8 % — ABNORMAL LOW (ref 36.0–46.0)
Hemoglobin: 7.8 g/dL — ABNORMAL LOW (ref 12.0–15.0)
MCH: 20.4 pg — ABNORMAL LOW (ref 26.0–34.0)
MCHC: 27.1 g/dL — ABNORMAL LOW (ref 30.0–36.0)
MCV: 75.2 fL — ABNORMAL LOW (ref 80.0–100.0)
Platelets: 255 10*3/uL (ref 150–400)
RBC: 3.83 MIL/uL — ABNORMAL LOW (ref 3.87–5.11)
RDW: 24 % — ABNORMAL HIGH (ref 11.5–15.5)
WBC: 10.1 10*3/uL (ref 4.0–10.5)
nRBC: 0 % (ref 0.0–0.2)

## 2021-07-08 LAB — PHOSPHORUS: Phosphorus: 2.8 mg/dL (ref 2.5–4.6)

## 2021-07-08 LAB — MAGNESIUM: Magnesium: 2 mg/dL (ref 1.7–2.4)

## 2021-07-08 MED ORDER — FUROSEMIDE 10 MG/ML IJ SOLN
20.0000 mg | Freq: Every day | INTRAMUSCULAR | Status: DC
  Administered 2021-07-09 – 2021-07-14 (×6): 20 mg via INTRAVENOUS
  Filled 2021-07-08 (×7): qty 2

## 2021-07-08 MED ORDER — PANTOPRAZOLE SODIUM 40 MG IV SOLR
40.0000 mg | Freq: Two times a day (BID) | INTRAVENOUS | Status: DC
  Administered 2021-07-08 – 2021-07-11 (×7): 40 mg via INTRAVENOUS
  Filled 2021-07-08 (×7): qty 40

## 2021-07-08 MED ORDER — RIVAROXABAN 20 MG PO TABS
20.0000 mg | ORAL_TABLET | Freq: Every day | ORAL | Status: DC
  Administered 2021-07-08 – 2021-07-16 (×8): 20 mg via ORAL
  Filled 2021-07-08 (×9): qty 1

## 2021-07-08 MED ORDER — METOPROLOL TARTRATE 5 MG/5ML IV SOLN
5.0000 mg | Freq: Four times a day (QID) | INTRAVENOUS | Status: DC
  Administered 2021-07-08 – 2021-07-10 (×8): 5 mg via INTRAVENOUS
  Filled 2021-07-08 (×8): qty 5

## 2021-07-08 MED ORDER — MAGNESIUM HYDROXIDE 400 MG/5ML PO SUSP
30.0000 mL | Freq: Three times a day (TID) | ORAL | Status: DC | PRN
  Filled 2021-07-08: qty 30

## 2021-07-08 NOTE — Progress Notes (Signed)
PROGRESS NOTE  Lauren [name] [id number] DOB: [birth year] DOA: 07/03/2021 PCP: Pcp, No    Brief History:  75 year old female with a history of atrial fibrillation, morbid obesity, diastolic CHF, hypertension, lymphedema presenting with constipation and rectal pain of 2 day duration.  The patient was recently admitted to [facility] from 06/28/2021 to 06/30/2021 for constipation.  During that admission, the patient was noted to have a hemoglobin of 5.6.  She was transfused with 2 units PRBC.  Her discharge hemoglobin was 8.  She had a CT of the abdomen and pelvis which showed cecal thickening raising questions of a possible cecal mass with small adjacent lymph nodes.  There is also skin thickening over her pannus.  The patient was given cathartics and ultimately had a bowel movement.  The patient was scheduled to have colonoscopy performed by general surgery, but she left AGAINST MEDICAL ADVICE prior to this being done.  An echocardiogram was performed on 06/28/2021 which showed EF 30%, mild to moderate MR, and normal RV. The patient states that she has not been fully compliant with all her prescription medications.  She has not taken her prescription medicines including her torsemide, Xarelto, and diltiazem since her discharge from [facility].  Even prior to that, patient states that she has not been fully compliant with her medications.  She states that she does not use any cathartics at home.  She denies any hematochezia, melena, hematemesis. In the emergency department, patient was afebrile and hemodynamically stable.  She was noted to have atrial fibrillation with RVR.  Oxygen saturation was 96% on room air.  BMP was unremarkable.  WBC 8.3, hemoglobin 7.8, platelets 265,000.  LFTs were unremarkable.   Assessment/Plan: Atrial fibrillation with RVR -Continue metoprolol succinate 25 mg bid -digoxin added by cardiology -06/29/21 Echo EF 30%, mild-mod MR -Soft blood pressures limit titration of her  chronotropic medications -Personally reviewed EKG--atrial fibrillation, nonspecific T wave change -holding rivaroxaban in preparation for surgery -cardiology consult appreciated -8/14 start IV lopressor as pt not tolerating po   Abnormal CT abdomen/cecal mass -GI consult appreciated--discussed with Dr. Abbey Chatters -06/28/2021 CT abdomen as discussed above -07/05/21 colonoscopy--partially obstructing cecal mass -general surgery consult -R-hemicolectomy 07/06/21--Dr. Arnoldo Morale -discussed with Dr. Aletha Halim to full liquids 8/13 -have emesis 8/13-8/14>>>back down to clears -8/14 am--one small liquid stool   Situational Anxiety -started in IV ativan by Dr. Arnoldo Morale -minimize opioids as she became very somnolent with combo on 07/03/21 -d/c ativan as pt is somnolent with morphine and ativan   Iron deficiency anemia -06/29/2021 iron saturation 4, ferritin 5 -Holding iron supplementation until GI/surgical work-ups done   Morbid obesity -BMI 50.08 -Lifestyle modification   Hypothyroidism -Continue Synthroid   Lymphedema -Restart torsemide   Chronic systolic and Diastolic CHF -clinically euvolemic -restart home torsemide         Status is: Inpatient   The patient will require care spanning > 2 midnights and should be moved to inpatient because: Inpatient level of care appropriate due to severity of illness   Dispo: The patient is from: Home              Anticipated d/c is to: Home              Patient currently is not medically stable to d/c.              Difficult to place patient No  Family Communication:  no Family at bedside   Consultants:  GI, general surgery   Code Status:  FULL    DVT Prophylaxis: Xarelto on hold     Procedures: As Listed in Progress Note Above   Antibiotics: None        Subjective: Patient is somnolent but arouses easily to voice.  Had n/v x 2 last 24 hours.  Complains of abd pain, largely controlled.  Denies f/c, cp,  sob.  Had one liquid stool am 8/14  Objective: Vitals:   07/07/21 2200 07/08/21 0000 07/08/21 0400 07/08/21 0718  BP: 131/64     Pulse: (P) 87     Resp: 12   (!) 21  Temp:  97.7 F (36.5 C) 97.8 F (36.6 C) (!) 97.5 F (36.4 C)  TempSrc:  Oral Oral Oral  SpO2: 96%     Weight:      Height:        Intake/Output Summary (Last 24 hours) at 07/08/2021 1105 Last data filed at 07/08/2021 0600 Gross per 24 hour  Intake 1395.02 ml  Output 2100 ml  Net -704.98 ml   Weight change:  Exam:  General:  Pt is somnolent but awakes to voice, follows commands appropriately, not in acute distress HEENT: No icterus, No thrush, No neck mass, [place]/AT Cardiovascular: IRRR, S1/S2, no rubs, no gallops Respiratory: CTA bilaterally, no wheezing, no crackles, no rhonchi Abdomen: Soft/+BS, diffuse tender, non distended, no guarding Extremities: Nonpitting edema, No lymphangitis, No petechiae, No rashes, no synovitis   Data Reviewed: I have personally reviewed following labs and imaging studies Basic Metabolic Panel: Recent Labs  Lab 07/04/21 0519 07/05/21 0549 07/06/21 0620 07/07/21 0341 07/08/21 0314  NA 136 135 136 135 137  K 3.7 3.7 3.7 4.2 3.8  CL 103 105 103 106 102  CO2 '27 24 26 26 27  '$ GLUCOSE 83 75 74 133* 77  BUN 11 8 7* 9 10  CREATININE 0.65 0.66 0.76 0.85 0.85  CALCIUM 8.4* 8.3* 8.0* 8.4* 8.2*  MG  --   --   --  1.7 2.0  PHOS  --   --   --  3.3 2.8   Liver Function Tests: Recent Labs  Lab 07/03/21 1342  AST 22  ALT 19  ALKPHOS 104  BILITOT 1.3*  PROT 5.6*  ALBUMIN 2.7*   No results for input(s): LIPASE, AMYLASE in the last 168 hours. No results for input(s): AMMONIA in the last 168 hours. Coagulation Profile: No results for input(s): INR, PROTIME in the last 168 hours. CBC: Recent Labs  Lab 07/03/21 1342 07/04/21 0519 07/05/21 0549 07/06/21 0620 07/07/21 0341 07/08/21 0314  WBC 8.3 6.5 6.6 6.6 10.3 10.1  NEUTROABS 5.9  --   --   --   --   --   HGB 7.8* 7.8*  7.6* 7.5* 7.8* 7.8*  HCT 28.3* 28.9* 27.9* 27.6* 29.0* 28.8*  MCV 75.3* 75.9* 75.0* 74.2* 76.1* 75.2*  PLT 253 265 249 228 231 255   Cardiac Enzymes: No results for input(s): CKTOTAL, CKMB, CKMBINDEX, TROPONINI in the last 168 hours. BNP: Invalid input(s): POCBNP CBG: Recent Labs  Lab 07/04/21 0755 07/04/21 1131 07/05/21 0732  GLUCAP 85 83 78   HbA1C: No results for input(s): HGBA1C in the last 72 hours. Urine analysis:    Component Value Date/Time   COLORURINE YELLOW 03/26/2014 1346   APPEARANCEUR CLEAR 03/26/2014 1346   LABSPEC 1.011 03/26/2014 1346   PHURINE 5.0 03/26/2014 1346   GLUCOSEU NEGATIVE 03/26/2014  [place] (A) 03/26/2014 1346   BILIRUBINUR NEGATIVE 03/26/2014 1346   KETONESUR NEGATIVE 03/26/2014 1346   PROTEINUR NEGATIVE 03/26/2014 1346   UROBILINOGEN 0.2 03/26/2014 1346   NITRITE NEGATIVE 03/26/2014 1346   LEUKOCYTESUR NEGATIVE 03/26/2014 1346   Sepsis Labs: '@LABRCNTIP'$ (procalcitonin:4,lacticidven:4) ) Recent Results (from the past 240 hour(s))  SARS CORONAVIRUS 2 (Alexsia Klindt 6-24 HRS) Nasopharyngeal Nasopharyngeal Swab     Status: None   Collection Time: 07/03/21  7:45 PM   Specimen: Nasopharyngeal Swab  Result Value Ref Range Status   SARS Coronavirus 2 NEGATIVE NEGATIVE Final    Comment: (NOTE) SARS-CoV-2 target nucleic acids are NOT DETECTED.  The SARS-CoV-2 RNA is generally detectable in upper and lower respiratory specimens during the acute phase of infection. Negative results do not preclude SARS-CoV-2 infection, do not rule out co-infections with other pathogens, and should not be used as the sole basis for treatment or other patient management decisions. Negative results must be combined with clinical observations, patient history, and epidemiological information. The expected result is Negative.  Fact Sheet for Patients: SugarRoll.be  Fact Sheet for Healthcare  Providers: https://www.woods-mathews.com/  This test is not yet approved or cleared by the [place] FDA and  has been authorized for detection and/or diagnosis of SARS-CoV-2 by FDA under an Emergency Use Authorization (EUA). This EUA will remain  in effect (meaning this test can be used) for the duration of the COVID-19 declaration under Se ction 564(b)(1) of the Act, 21 U.S.C. section 360bbb-3(b)(1), unless the authorization is terminated or revoked sooner.  Performed at [facility], [place] [address] [place], [place] [address]   Surgical PCR screen     Status: None   Collection Time: 07/05/21  3:20 PM   Specimen: Nasal Mucosa; Nasal Swab  Result Value Ref Range Status   MRSA, PCR NEGATIVE NEGATIVE Final   Staphylococcus aureus NEGATIVE NEGATIVE Final    Comment: (NOTE) The Xpert SA Assay (FDA approved for NASAL specimens in patients 41 years of age and older), is one component of a comprehensive surveillance program. It is not intended to diagnose infection nor to guide or monitor treatment. Performed at [facility], [address], [place], [place] [address]      Scheduled Meds:  Chlorhexidine Gluconate Cloth  6 each Topical Daily   enoxaparin (LOVENOX) injection  40 mg Subcutaneous Q24H   levothyroxine  50 mcg Intravenous Daily   metoprolol succinate  25 mg Oral BID   mupirocin ointment  1 application Nasal BID   pantoprazole  40 mg Oral Daily   torsemide  20 mg Oral Daily   Continuous Infusions:  sodium chloride 50 mL/hr at 07/07/21 1124    Procedures/Studies: DG Abdomen 1 View  Result Date: 07/03/2021 CLINICAL DATA:  Constipation EXAM: ABDOMEN - 1 VIEW COMPARISON:  CT pelvis June 28, 2021 FINDINGS: The bowel gas pattern is normal. Moderate volume of formed stool throughout the colon. Degenerative changes bilateral hips. Lumbar spondylosis. IMPRESSION: Nonobstructive bowel gas pattern. Moderate volume of formed stool throughout the colon.  Electronically Signed   By: Dahlia Bailiff MD   On: 07/03/2021 17:45    Orson Eva, DO  [facility]  If 7PM-7AM, please contact night-coverage [email or web address] Password [email or web address] 07/08/2021, 11:05 AM   LOS: 2 days

## 2021-07-08 NOTE — Progress Notes (Signed)
2 Days Post-Op  Subjective: Patient had emesis yesterday evening.  Denies any abdominal pain.  No bowel movement or flatus yet.  Objective: Vital signs in last 24 hours: Temp:  [97.4 F (36.3 C)-97.8 F (36.6 C)] 97.5 F (36.4 C) (08/14 0718) Pulse Rate:  [43-101] (P) 87 (08/13 2200) Resp:  [12-21] 21 (08/14 0718) BP: (86-131)/(35-75) 131/64 (08/13 2200) SpO2:  [89 %-98 %] 96 % (08/13 2200) Last BM Date: 07/04/21  Intake/Output from previous day: 08/13 0701 - 08/14 0700 In: 1515 [P.O.:590; I.V.:918.4; IV Piggyback:6.6] Out: 2100 [Urine:2100] Intake/Output this shift: No intake/output data recorded.  General appearance: alert, cooperative, and fatigued GI: Soft with minimal bowel sounds appreciated.  Incision healing well.  Lab Results:  Recent Labs    07/07/21 0341 07/08/21 0314  WBC 10.3 10.1  HGB 7.8* 7.8*  HCT 29.0* 28.8*  PLT 231 255   BMET Recent Labs    07/07/21 0341 07/08/21 0314  NA 135 137  K 4.2 3.8  CL 106 102  CO2 26 27  GLUCOSE 133* 77  BUN 9 10  CREATININE 0.85 0.85  CALCIUM 8.4* 8.2*   PT/INR No results for input(s): LABPROT, INR in the last 72 hours.  Studies/Results: No results found.  Anti-infectives: Anti-infectives (From admission, onward)    Start     Dose/Rate Route Frequency Ordered Stop   07/06/21 1200  ertapenem (INVANZ) 1,000 mg in sodium chloride 0.9 % 100 mL IVPB        1 g 200 mL/hr over 30 Minutes Intravenous On call to O.R. 07/05/21 1453 07/06/21 1113   07/06/21 0941  sodium chloride 0.9 % with ertapenem [facility]) ADS Med       Note to Pharmacy: Augustin Schooling   : cabinet override      07/06/21 0941 07/06/21 1058   07/05/21 1600  neomycin (MYCIFRADIN) tablet 1,000 mg  Status:  Discontinued        1,000 mg Oral Every 4 hours 07/05/21 1438 07/06/21 0359   07/05/21 1530  metroNIDAZOLE (FLAGYL) IVPB 500 mg        500 mg 100 mL/hr over 60 Minutes Intravenous Every 4 hours 07/05/21 1438 07/06/21 0126        Assessment/Plan: s/p Procedure(s): RIGHT HEMICOLECTOMY Impression: Postoperative day 2.  Awaiting return of bowel function.  Hemoglobin has remained stable.  May restart Xarelto.  We will go back to clear liquid diet.  Ideally, would like to get patient up in a chair, but her body habitus makes this difficult.  Discussed with Dr. Carles Collet.  LOS: 2 days    [name] Signs 07/08/2021

## 2021-07-09 ENCOUNTER — Encounter: Payer: Self-pay | Admitting: Internal Medicine

## 2021-07-09 ENCOUNTER — Encounter (HOSPITAL_COMMUNITY): Payer: Self-pay | Admitting: Internal Medicine

## 2021-07-09 DIAGNOSIS — I4891 Unspecified atrial fibrillation: Secondary | ICD-10-CM | POA: Diagnosis not present

## 2021-07-09 DIAGNOSIS — K6389 Other specified diseases of intestine: Secondary | ICD-10-CM | POA: Diagnosis not present

## 2021-07-09 DIAGNOSIS — I5042 Chronic combined systolic (congestive) and diastolic (congestive) heart failure: Secondary | ICD-10-CM | POA: Diagnosis not present

## 2021-07-09 DIAGNOSIS — I890 Lymphedema, not elsewhere classified: Secondary | ICD-10-CM | POA: Diagnosis not present

## 2021-07-09 LAB — CBC
HCT: 33.3 % — ABNORMAL LOW (ref 36.0–46.0)
Hemoglobin: 8.9 g/dL — ABNORMAL LOW (ref 12.0–15.0)
MCH: 20 pg — ABNORMAL LOW (ref 26.0–34.0)
MCHC: 26.7 g/dL — ABNORMAL LOW (ref 30.0–36.0)
MCV: 75 fL — ABNORMAL LOW (ref 80.0–100.0)
Platelets: 272 10*3/uL (ref 150–400)
RBC: 4.44 MIL/uL (ref 3.87–5.11)
RDW: 24 % — ABNORMAL HIGH (ref 11.5–15.5)
WBC: 11.5 10*3/uL — ABNORMAL HIGH (ref 4.0–10.5)
nRBC: 0 % (ref 0.0–0.2)

## 2021-07-09 LAB — PHOSPHORUS: Phosphorus: 2.9 mg/dL (ref 2.5–4.6)

## 2021-07-09 LAB — BASIC METABOLIC PANEL
Anion gap: 11 (ref 5–15)
BUN: 9 mg/dL (ref 8–23)
CO2: 23 mmol/L (ref 22–32)
Calcium: 8 mg/dL — ABNORMAL LOW (ref 8.9–10.3)
Chloride: 101 mmol/L (ref 98–111)
Creatinine, Ser: 0.76 mg/dL (ref 0.44–1.00)
GFR, Estimated: >60 mL/min (ref >60)
Glucose, Bld: 102 mg/dL — ABNORMAL HIGH (ref 70–99)
Potassium: 3.7 mmol/L (ref 3.5–5.1)
Sodium: 135 mmol/L (ref 135–145)

## 2021-07-09 LAB — TYPE AND SCREEN
ABO/RH(D): O POS
Antibody Screen: NEGATIVE
Unit division: 0

## 2021-07-09 LAB — BPAM RBC
Blood Product Expiration Date: 202209152359
Unit Type and Rh: 5100

## 2021-07-09 LAB — SURGICAL PATHOLOGY

## 2021-07-09 LAB — MAGNESIUM: Magnesium: 1.9 mg/dL (ref 1.7–2.4)

## 2021-07-09 MED ORDER — DIGOXIN 0.25 MG/ML IJ SOLN
0.2500 mg | Freq: Once | INTRAMUSCULAR | Status: AC
  Administered 2021-07-09: 0.25 mg via INTRAVENOUS
  Filled 2021-07-09: qty 2

## 2021-07-09 MED ORDER — METOCLOPRAMIDE HCL 5 MG/ML IJ SOLN
10.0000 mg | Freq: Four times a day (QID) | INTRAMUSCULAR | Status: AC
  Administered 2021-07-09 – 2021-07-10 (×8): 10 mg via INTRAVENOUS
  Filled 2021-07-09 (×8): qty 2

## 2021-07-09 MED ORDER — SODIUM CHLORIDE 0.9 % IV SOLN
250.0000 mg | Freq: Once | INTRAVENOUS | Status: AC
  Administered 2021-07-09: 250 mg via INTRAVENOUS
  Filled 2021-07-09: qty 20

## 2021-07-09 NOTE — Progress Notes (Signed)
PROGRESS NOTE  Lauren [name] [id number] DOB: 07-Feb-1946 DOA: 07/03/2021 PCP: Pcp, No   Brief History:  75 year old female with a history of atrial fibrillation, morbid obesity, diastolic CHF, hypertension, lymphedema presenting with constipation and rectal pain of 2 day duration.  The patient was recently admitted to [facility] from 06/28/2021 to 06/30/2021 for constipation.  During that admission, the patient was noted to have a hemoglobin of 5.6.  She was transfused with 2 units PRBC.  Her discharge hemoglobin was 8.  She had a CT of the abdomen and pelvis which showed cecal thickening raising questions of a possible cecal mass with small adjacent lymph nodes.  There is also skin thickening over her pannus.  The patient was given cathartics and ultimately had a bowel movement.  The patient was scheduled to have colonoscopy performed by general surgery, but she left AGAINST MEDICAL ADVICE prior to this being done.  An echocardiogram was performed on 06/28/2021 which showed EF 30%, mild to moderate MR, and normal RV. The patient states that she has not been fully compliant with all her prescription medications.  She has not taken her prescription medicines including her torsemide, Xarelto, and diltiazem since her discharge from [facility].  Even prior to that, patient states that she has not been fully compliant with her medications.  She states that she does not use any cathartics at home.  She denies any hematochezia, melena, hematemesis. In the emergency department, patient was afebrile and hemodynamically stable.  She was noted to have atrial fibrillation with RVR.  Oxygen saturation was 96% on room air.  BMP was unremarkable.  WBC 8.3, hemoglobin 7.8, platelets 265,000.  LFTs were unremarkable.   Assessment/Plan: Atrial fibrillation with RVR -Initially on metoprolol succinate 25 mg bid -digoxin added by cardiology -06/29/21 Echo EF 30%, mild-mod MR -Soft blood pressures limit titration of her  chronotropic medications intially -Personally reviewed EKG--atrial fibrillation, nonspecific T wave change -cardiology consult appreciated -8/14 started IV lopressor as pt not tolerating po   Abnormal CT abdomen/cecal mass -GI consult appreciated--discussed with Dr. Abbey Chatters -06/28/2021 CT abdomen as discussed above -07/05/21 colonoscopy--partially obstructing cecal mass -general surgery consult -R-hemicolectomy 07/06/21--Dr. Arnoldo Morale -discussed with Dr. Aletha Halim to full liquids 8/13 -have emesis 8/13-8/14>>>back down to clears -8/15--still having intermittent emesis>>reglan trial   Situational Anxiety -started in IV ativan by Dr. Arnoldo Morale -minimize opioids as she became very somnolent with combo on 07/03/21 -d/c ativan as pt is somnolent with morphine and ativan   Iron deficiency anemia -06/29/2021 iron saturation 4, ferritin 5 -dose nulecit   Morbid obesity -BMI 50.08 -Lifestyle modification   Hypothyroidism -Continue Synthroid   Lymphedema -Restart torsemide   Chronic systolic and Diastolic CHF -clinically euvolemic -restart home torsemide when tolerating po -lasix 20 mg IV daily for now         Status is: Inpatient   The patient will require care spanning > 2 midnights and should be moved to inpatient because: Inpatient level of care appropriate due to severity of illness   Dispo: The patient is from: Home              Anticipated d/c is to: Home              Patient currently is not medically stable to d/c.              Difficult to place patient No  Family Communication:  no Family at bedside   Consultants:  GI, general surgery   Code Status:  FULL    DVT Prophylaxis: Xarelto restarted 8/14     Procedures: As Listed in Progress Note Above   Antibiotics: None   Subjective:   Objective: Vitals:   07/09/21 0901 07/09/21 1000 07/09/21 1100 07/09/21 1200  BP:  (!) 129/92 (!) 139/57 (!) 131/99  Pulse: 83 86 (!) 114 81  Resp:  '12 12 12 11  '$ Temp:   (!) 97.4 F (36.3 C)   TempSrc:   Oral   SpO2: 97% 92% 95% 97%  Weight:      Height:        Intake/Output Summary (Last 24 hours) at 07/09/2021 1547 Last data filed at 07/09/2021 1420 Gross per 24 hour  Intake 858.57 ml  Output 2450 ml  Net -1591.43 ml   Weight change:  Exam:  General:  Pt is alert, follows commands appropriately, not in acute distress HEENT: No icterus, No thrush, No neck mass, [place]/AT Cardiovascular: RRR, S1/S2, no rubs, no gallops Respiratory: CTA bilaterally, no wheezing, no crackles, no rhonchi Abdomen: Soft/+BS, non tender, non distended, no guarding Extremities: No edema, No lymphangitis, No petechiae, No rashes, no synovitis   Data Reviewed: I have personally reviewed following labs and imaging studies Basic Metabolic Panel: Recent Labs  Lab 07/05/21 0549 07/06/21 0620 07/07/21 0341 07/08/21 0314 07/09/21 0343  NA 135 136 135 137 135  K 3.7 3.7 4.2 3.8 3.7  CL 105 103 106 102 101  CO2 '24 26 26 27 23  '$ GLUCOSE 75 74 133* 77 102*  BUN 8 7* '9 10 9  '$ CREATININE 0.66 0.76 0.85 0.85 0.76  CALCIUM 8.3* 8.0* 8.4* 8.2* 8.0*  MG  --   --  1.7 2.0 1.9  PHOS  --   --  3.3 2.8 2.9   Liver Function Tests: Recent Labs  Lab 07/03/21 1342  AST 22  ALT 19  ALKPHOS 104  BILITOT 1.3*  PROT 5.6*  ALBUMIN 2.7*   No results for input(s): LIPASE, AMYLASE in the last 168 hours. No results for input(s): AMMONIA in the last 168 hours. Coagulation Profile: No results for input(s): INR, PROTIME in the last 168 hours. CBC: Recent Labs  Lab 07/03/21 1342 07/04/21 0519 07/05/21 0549 07/06/21 0620 07/07/21 0341 07/08/21 0314 07/09/21 0343  WBC 8.3   < > 6.6 6.6 10.3 10.1 11.5*  NEUTROABS 5.9  --   --   --   --   --   --   HGB 7.8*   < > 7.6* 7.5* 7.8* 7.8* 8.9*  HCT 28.3*   < > 27.9* 27.6* 29.0* 28.8* 33.3*  MCV 75.3*   < > 75.0* 74.2* 76.1* 75.2* 75.0*  PLT 253   < > 249 228 231 255 272   < > = values in this interval not  displayed.   Cardiac Enzymes: No results for input(s): CKTOTAL, CKMB, CKMBINDEX, TROPONINI in the last 168 hours. BNP: Invalid input(s): POCBNP CBG: Recent Labs  Lab 07/04/21 0755 07/04/21 1131 07/05/21 0732  GLUCAP 85 83 78   HbA1C: No results for input(s): HGBA1C in the last 72 hours. Urine analysis:    Component Value Date/Time   COLORURINE YELLOW 03/26/2014 1346   APPEARANCEUR CLEAR 03/26/2014 1346   LABSPEC 1.011 03/26/2014 1346   PHURINE 5.0 03/26/2014 1346   GLUCOSEU NEGATIVE 03/26/2014 1346   HGBUR MODERATE (A) 03/26/2014 1346   BILIRUBINUR NEGATIVE 03/26/2014 1346  KETONESUR NEGATIVE 03/26/2014 1346   PROTEINUR NEGATIVE 03/26/2014 1346   UROBILINOGEN 0.2 03/26/2014 1346   NITRITE NEGATIVE 03/26/2014 1346   LEUKOCYTESUR NEGATIVE 03/26/2014 1346   Sepsis Labs: '@LABRCNTIP'$ (procalcitonin:4,lacticidven:4) ) Recent Results (from the past 240 hour(s))  SARS CORONAVIRUS 2 ([place] 6-24 HRS) Nasopharyngeal Nasopharyngeal Swab     Status: None   Collection Time: 07/03/21  7:45 PM   Specimen: Nasopharyngeal Swab  Result Value Ref Range Status   SARS Coronavirus 2 NEGATIVE NEGATIVE Final    Comment: (NOTE) SARS-CoV-2 target nucleic acids are NOT DETECTED.  The SARS-CoV-2 RNA is generally detectable in upper and lower respiratory specimens during the acute phase of infection. Negative results do not preclude SARS-CoV-2 infection, do not rule out co-infections with other pathogens, and should not be used as the sole basis for treatment or other patient management decisions. Negative results must be combined with clinical observations, patient history, and epidemiological information. The expected result is Negative.  Fact Sheet for Patients: SugarRoll.be  Fact Sheet for Healthcare Providers: https://www.woods-mathews.com/  This test is not yet approved or cleared by the [place] FDA and  has been authorized for detection  and/or diagnosis of SARS-CoV-2 by FDA under an Emergency Use Authorization (EUA). This EUA will remain  in effect (meaning this test can be used) for the duration of the COVID-19 declaration under Se ction 564(b)(1) of the Act, 21 U.S.C. section 360bbb-3(b)(1), unless the authorization is terminated or revoked sooner.  Performed at [facility], [place] [address] [place], [place] [address]   Surgical PCR screen     Status: None   Collection Time: 07/05/21  3:20 PM   Specimen: Nasal Mucosa; Nasal Swab  Result Value Ref Range Status   MRSA, PCR NEGATIVE NEGATIVE Final   Staphylococcus aureus NEGATIVE NEGATIVE Final    Comment: (NOTE) The Xpert SA Assay (FDA approved for NASAL specimens in patients 58 years of age and older), is one component of a comprehensive surveillance program. It is not intended to diagnose infection nor to guide or monitor treatment. Performed at [facility], [address] [place], [place] [address]      Scheduled Meds:  Chlorhexidine Gluconate Cloth  6 each Topical Daily   furosemide  20 mg Intravenous Daily   levothyroxine  50 mcg Intravenous Daily   metoCLOPramide (REGLAN) injection  10 mg Intravenous Q6H   metoprolol tartrate  5 mg Intravenous Q6H   mupirocin ointment  1 application Nasal BID   pantoprazole (PROTONIX) IV  40 mg Intravenous Q12H   rivaroxaban  20 mg Oral Q supper   Continuous Infusions:  sodium chloride 50 mL/hr at 07/09/21 1104    Procedures/Studies: DG Abdomen 1 View  Result Date: 07/03/2021 CLINICAL DATA:  Constipation EXAM: ABDOMEN - 1 VIEW COMPARISON:  CT pelvis June 28, 2021 FINDINGS: The bowel gas pattern is normal. Moderate volume of formed stool throughout the colon. Degenerative changes bilateral hips. Lumbar spondylosis. IMPRESSION: Nonobstructive bowel gas pattern. Moderate volume of formed stool throughout the colon. Electronically Signed   By: Dahlia Bailiff MD   On: 07/03/2021 17:45    Orson Eva, DO  [facility]  If 7PM-7AM, please contact night-coverage [email or web address] Password [email or web address] 07/09/2021, 3:47 PM   LOS: 3 days

## 2021-07-09 NOTE — Progress Notes (Signed)
No vomiting/emesis episodes noted this shift since 7am

## 2021-07-09 NOTE — Progress Notes (Signed)
Progress Note  Patient Name: Lauren [name] Date of Encounter: 07/09/2021  [facility] Cardiologist: Carlyle Dolly, MD   Subjective   Somewhat sleepy this morning.  Denies significant discomfort.  No shortness of breath or chest discomfort.  Unaware of irregular heart rhythm or racing.  Inpatient Medications    Scheduled Meds:  Chlorhexidine Gluconate Cloth  6 each Topical Daily   furosemide  20 mg Intravenous Daily   levothyroxine  50 mcg Intravenous Daily   metoCLOPramide (REGLAN) injection  10 mg Intravenous Q6H   metoprolol tartrate  5 mg Intravenous Q6H   mupirocin ointment  1 application Nasal BID   pantoprazole (PROTONIX) IV  40 mg Intravenous Q12H   rivaroxaban  20 mg Oral Q supper   Continuous Infusions:  sodium chloride 50 mL/hr at 07/09/21 0639   PRN Meds: acetaminophen **OR** acetaminophen, [COMPLETED] ketorolac **FOLLOWED BY** ketorolac, magnesium hydroxide, metoprolol tartrate, morphine injection, ondansetron **OR** ondansetron (ZOFRAN) IV, simethicone, traMADol   Vital Signs    Vitals:   07/09/21 0600 07/09/21 0700 07/09/21 0705 07/09/21 0800  BP: 119/65 (!) 114/54  (!) 134/58  Pulse: 81 (!) 114 81 92  Resp: '15 17 14 14  '$ Temp:  (!) 97.5 F (36.4 C)    TempSrc:  Oral    SpO2: 98% 95% 97% 96%  Weight:      Height:        Intake/Output Summary (Last 24 hours) at 07/09/2021 0903 Last data filed at 07/09/2021 [id number] Gross per 24 hour  Intake 1326.78 ml  Output 850 ml  Net 476.78 ml   Last 3 Weights 07/06/2021 07/03/2021 07/03/2021  Weight (lbs) 240 lb 15.4 oz 273 lb 13 oz 270 lb  Weight (kg) 109.3 kg 124.2 kg 122.471 kg      Telemetry    Atrial fibrillation, slight increase in ventricular response noted personally Reviewed   Physical Exam  Large woman in bed, semirecumbent, no distress GEN: No acute distress.   Neck: No JVD Cardiac: IrRegular rhythm slight increased ventricular response ,no murmurs, rubs, or gallops.  Respiratory: Clear to  auscultation bilaterally. GI: No bowel sounds MS: Large legs, less puffy than when initially seen. Neuro:  Nonfocal  Psych: Normal affect   Labs    High Sensitivity Troponin:  No results for input(s): TROPONINIHS in the last 720 hours.    Chemistry Recent Labs  Lab 07/03/21 1342 07/04/21 0519 07/07/21 0341 07/08/21 0314 07/09/21 0343  NA 135   < > 135 137 135  K 3.8   < > 4.2 3.8 3.7  CL 103   < > 106 102 101  CO2 27   < > '26 27 23  '$ GLUCOSE 105*   < > 133* 77 102*  BUN 13   < > '9 10 9  '$ CREATININE 0.73   < > 0.85 0.85 0.76  CALCIUM 8.3*   < > 8.4* 8.2* 8.0*  PROT 5.6*  --   --   --   --   ALBUMIN 2.7*  --   --   --   --   AST 22  --   --   --   --   ALT 19  --   --   --   --   ALKPHOS 104  --   --   --   --   BILITOT 1.3*  --   --   --   --   GFRNONAA >60   < > >60 >60 >60  ANIONGAP 5   < > 3* 8 11   < > = values in this interval not displayed.     Hematology Recent Labs  Lab 07/07/21 0341 07/08/21 0314 07/09/21 0343  WBC 10.3 10.1 11.5*  RBC 3.81* 3.83* 4.44  HGB 7.8* 7.8* 8.9*  HCT 29.0* 28.8* 33.3*  MCV 76.1* 75.2* 75.0*  MCH 20.5* 20.4* 20.0*  MCHC 26.9* 27.1* 26.7*  RDW 23.3* 24.0* 24.0*  PLT 231 255 272    BNPNo results for input(s): BNP, PROBNP in the last 168 hours.   DDimer No results for input(s): DDIMER in the last 168 hours.   Radiology    No results found.    Assessment & Plan  Chronic atrial fibrillation          recent issues with rate control due to noncompliance as well as her significant anemia initially presented with a hemoglobin just over 5 for which she received a transfusion with current hemoglobin 7.6 2.  Left ventricular systolic dysfunction, significant suspect in part related to lack of medication with inadequate ventricular rate control. 3.  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  Chronic lymphedema 5  Thyroid Disease- euthyroid on replacement (labs at [facility])  Plan: We will order IV  Lanoxin for rate control today.  Hopefully once her GI tract starts working we can administer oral meds for rate control.  HemoDynamically she is stable.  I would administer iron injections but will let hospitalist/surgeon decide. For questions or updates, please contact [place] Please consult [email or web address] for contact info under        Signed, Abel Presto, MD  07/09/2021, 9:03 AM

## 2021-07-09 NOTE — Progress Notes (Signed)
3 Days Post-Op  Subjective: Patient had several episodes of emesis.  No bowel movement recorded.  Objective: Vital signs in last 24 hours: Temp:  [97.1 F (36.2 C)-98.7 F (37.1 C)] 97.6 F (36.4 C) (08/15 0400) Pulse Rate:  [51-145] 81 (08/15 0705) Resp:  [14-23] 14 (08/15 0705) BP: (114-151)/(54-97) 114/54 (08/15 0700) SpO2:  [93 %-100 %] 97 % (08/15 0705) Last BM Date: 07/08/21  Intake/Output from previous day: 08/14 0701 - 08/15 0700 In: 1326.8 [P.O.:100; I.V.:1226.8] Out: 850 [Urine:850] Intake/Output this shift: No intake/output data recorded.  General appearance: alert and fatigued GI: Soft, minimal bowel sounds appreciated.  Incision healing well.  Lab Results:  Recent Labs    07/08/21 0314 07/09/21 0343  WBC 10.1 11.5*  HGB 7.8* 8.9*  HCT 28.8* 33.3*  PLT 255 272   BMET Recent Labs    07/08/21 0314 07/09/21 0343  NA 137 135  K 3.8 3.7  CL 102 101  CO2 27 23  GLUCOSE 77 102*  BUN 10 9  CREATININE 0.85 0.76  CALCIUM 8.2* 8.0*   PT/INR No results for input(s): LABPROT, INR in the last 72 hours.  Studies/Results: No results found.  Anti-infectives: Anti-infectives (From admission, onward)    Start     Dose/Rate Route Frequency Ordered Stop   07/06/21 1200  ertapenem (INVANZ) 1,000 mg in sodium chloride 0.9 % 100 mL IVPB        1 g 200 mL/hr over 30 Minutes Intravenous On call to O.R. 07/05/21 1453 07/06/21 1113   07/06/21 0941  sodium chloride 0.9 % with ertapenem [facility]) ADS Med       Note to Pharmacy: Augustin Schooling   : cabinet override      07/06/21 0941 07/06/21 1058   07/05/21 1600  neomycin (MYCIFRADIN) tablet 1,000 mg  Status:  Discontinued        1,000 mg Oral Every 4 hours 07/05/21 1438 07/06/21 0359   07/05/21 1530  metroNIDAZOLE (FLAGYL) IVPB 500 mg        500 mg 100 mL/hr over 60 Minutes Intravenous Every 4 hours 07/05/21 1438 07/06/21 0126       Assessment/Plan: s/p Procedure(s): RIGHT HEMICOLECTOMY Impression:  Postoperative day 3.  Still no return of bowel function.  Hemoglobin stable after restarting Xarelto.  We will try Reglan for 48 hours to see if that helps.  Ideally, I would like the patient sitting in a chair during the day.  Given patient's body habitus, this is difficult.  Final pathology pending.  LOS: 3 days    [name] Signs 07/09/2021

## 2021-07-10 DIAGNOSIS — C189 Malignant neoplasm of colon, unspecified: Secondary | ICD-10-CM

## 2021-07-10 DIAGNOSIS — I4891 Unspecified atrial fibrillation: Secondary | ICD-10-CM | POA: Diagnosis not present

## 2021-07-10 DIAGNOSIS — D500 Iron deficiency anemia secondary to blood loss (chronic): Secondary | ICD-10-CM

## 2021-07-10 DIAGNOSIS — I5042 Chronic combined systolic (congestive) and diastolic (congestive) heart failure: Secondary | ICD-10-CM | POA: Diagnosis not present

## 2021-07-10 DIAGNOSIS — I890 Lymphedema, not elsewhere classified: Secondary | ICD-10-CM | POA: Diagnosis not present

## 2021-07-10 LAB — BASIC METABOLIC PANEL
Anion gap: 8 (ref 5–15)
BUN: 7 mg/dL — ABNORMAL LOW (ref 8–23)
CO2: 28 mmol/L (ref 22–32)
Calcium: 8.2 mg/dL — ABNORMAL LOW (ref 8.9–10.3)
Chloride: 101 mmol/L (ref 98–111)
Creatinine, Ser: 0.69 mg/dL (ref 0.44–1.00)
GFR, Estimated: >60 mL/min (ref >60)
Glucose, Bld: 80 mg/dL (ref 70–99)
Potassium: 3.4 mmol/L — ABNORMAL LOW (ref 3.5–5.1)
Sodium: 137 mmol/L (ref 135–145)

## 2021-07-10 LAB — CBC
HCT: 30.9 % — ABNORMAL LOW (ref 36.0–46.0)
Hemoglobin: 8.4 g/dL — ABNORMAL LOW (ref 12.0–15.0)
MCH: 20.2 pg — ABNORMAL LOW (ref 26.0–34.0)
MCHC: 27.2 g/dL — ABNORMAL LOW (ref 30.0–36.0)
MCV: 74.5 fL — ABNORMAL LOW (ref 80.0–100.0)
Platelets: 235 10*3/uL (ref 150–400)
RBC: 4.15 MIL/uL (ref 3.87–5.11)
RDW: 24.2 % — ABNORMAL HIGH (ref 11.5–15.5)
WBC: 9 10*3/uL (ref 4.0–10.5)
nRBC: 0 % (ref 0.0–0.2)

## 2021-07-10 LAB — PHOSPHORUS: Phosphorus: 2.8 mg/dL (ref 2.5–4.6)

## 2021-07-10 LAB — MAGNESIUM: Magnesium: 1.8 mg/dL (ref 1.7–2.4)

## 2021-07-10 MED ORDER — METOPROLOL SUCCINATE ER 25 MG PO TB24
12.5000 mg | ORAL_TABLET | Freq: Two times a day (BID) | ORAL | Status: DC
  Administered 2021-07-10 (×2): 12.5 mg via ORAL
  Filled 2021-07-10 (×3): qty 1

## 2021-07-10 MED ORDER — DIGOXIN 0.25 MG/ML IJ SOLN
0.1250 mg | Freq: Once | INTRAMUSCULAR | Status: AC
  Administered 2021-07-10: 0.125 mg via INTRAVENOUS
  Filled 2021-07-10: qty 2

## 2021-07-10 MED ORDER — POLYETHYLENE GLYCOL 3350 17 G PO PACK
17.0000 g | PACK | Freq: Two times a day (BID) | ORAL | Status: DC
Start: 2021-07-10 — End: 2021-07-14
  Administered 2021-07-10 – 2021-07-13 (×4): 17 g via ORAL
  Filled 2021-07-10 (×8): qty 1

## 2021-07-10 MED ORDER — METOPROLOL TARTRATE 5 MG/5ML IV SOLN
5.0000 mg | Freq: Four times a day (QID) | INTRAVENOUS | Status: AC
  Administered 2021-07-10 (×2): 5 mg via INTRAVENOUS
  Filled 2021-07-10 (×2): qty 5

## 2021-07-10 MED ORDER — POTASSIUM CHLORIDE CRYS ER 10 MEQ PO TBCR
10.0000 meq | EXTENDED_RELEASE_TABLET | Freq: Two times a day (BID) | ORAL | Status: AC
  Administered 2021-07-10 – 2021-07-11 (×2): 10 meq via ORAL
  Filled 2021-07-10 (×3): qty 1

## 2021-07-10 NOTE — Progress Notes (Signed)
PROGRESS NOTE  Lauren [name] [id number] DOB: 1945-12-08 DOA: 07/03/2021 PCP: Pcp, No  Brief History:  75 year old female with a history of atrial fibrillation, morbid obesity, diastolic CHF, hypertension, lymphedema presenting with constipation and rectal pain of 2 day duration.  The patient was recently admitted to [facility] from 06/28/2021 to 06/30/2021 for constipation.  During that admission, the patient was noted to have a hemoglobin of 5.6.  She was transfused with 2 units PRBC.  Her discharge hemoglobin was 8.  She had a CT of the abdomen and pelvis which showed cecal thickening raising questions of a possible cecal mass with small adjacent lymph nodes.  There is also skin thickening over her pannus.  The patient was given cathartics and ultimately had a bowel movement.  The patient was scheduled to have colonoscopy performed by general surgery, but she left AGAINST MEDICAL ADVICE prior to this being done.  An echocardiogram was performed on 06/28/2021 which showed EF 30%, mild to moderate MR, and normal RV. The patient states that she has not been fully compliant with all her prescription medications.  She has not taken her prescription medicines including her torsemide, Xarelto, and diltiazem since her discharge from [facility].  Even prior to that, patient states that she has not been fully compliant with her medications.  She states that she does not use any cathartics at home.  She denies any hematochezia, melena, hematemesis. In the emergency department, patient was afebrile and hemodynamically stable.  She was noted to have atrial fibrillation with RVR.  Oxygen saturation was 96% on room air.  BMP was unremarkable.  WBC 8.3, hemoglobin 7.8, platelets 265,000.  LFTs were unremarkable.   Assessment/Plan: Atrial fibrillation with RVR -Initially on metoprolol succinate 25 mg bid -digoxin added by cardiology -06/29/21 Echo EF 30%, mild-mod MR -Soft blood pressures limit titration of her  chronotropic medications intially -Personally reviewed EKG--atrial fibrillation, nonspecific T wave change -cardiology consult appreciated -8/14 started IV lopressor as pt not tolerating po>>>transitioning to po metoprolol succinate by cardiology -8/16--digoxin x 1   Abnormal CT abdomen/cecal mass -GI consult appreciated--discussed with Dr. Abbey Chatters -06/28/2021 CT abdomen as discussed above -07/05/21 colonoscopy--partially obstructing cecal mass -general surgery consult appreciated -R-hemicolectomy 07/06/21--Dr. Arnoldo Morale -discussed with Dr. 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 Arnoldo Morale initially -minimize opioids as she became very somnolent with combo on 07/03/21 -d/c ativan as pt is somnolent with morphine and ativan   Iron deficiency anemia -06/29/2021 iron saturation 4, ferritin 5 -dose nulecit x 2   Morbid obesity -BMI 50.08 -Lifestyle modification   Hypothyroidism -Continue Synthroid   Lymphedema -Restarted torsemide   Chronic systolic and Diastolic CHF -clinically euvolemic -restart home torsemide when tolerating po -lasix 20 mg IV daily for now         Status is: Inpatient   The patient will require care spanning > 2 midnights and should be moved to inpatient because: Inpatient level of care appropriate due to severity of illness   Dispo: The patient is from: Home              Anticipated d/c is to: Home              Patient currently is not medically stable to  d/c.              Difficult to place patient No               Family Communication:  no Family at bedside   Consultants:  GI, general surgery   Code Status:  FULL    DVT Prophylaxis: Xarelto restarted 8/14      Procedures: As Listed in Progress Note Above   Antibiotics: None     Subjective: Patient complains of abd pain, controlled.  Having nausea.  No BM yet.  Occasional flatus.  Denies f/c, cp, sob.  Objective: Vitals:   07/10/21 0600 07/10/21 0700 07/10/21 0800 07/10/21 1209  BP: (!) 139/115 103/63 130/79   Pulse: (!) 48 85 85   Resp: '15 13 15   '$ Temp:   98 F (36.7 C) 97.6 F (36.4 C)  TempSrc:   Oral Oral  SpO2: 90% 94% 95%   Weight:      Height:        Intake/Output Summary (Last 24 hours) at 07/10/2021 1326 Last data filed at 07/10/2021 1000 Gross per 24 hour  Intake 1224.85 ml  Output 2500 ml  Net -1275.15 ml   Weight change:  Exam:  General:  Pt is alert, follows commands appropriately, not in acute distress HEENT: No icterus, No thrush, No neck mass, [place]/AT Cardiovascular: RRR, S1/S2, no rubs, no gallops Respiratory: bibasilar crackles. No wheeze Abdomen: Soft/+BS, non tender, non distended, no guarding Extremities: Nonpitting edema, No lymphangitis, No petechiae, No rashes, no synovitis   Data Reviewed: I have personally reviewed following labs and imaging studies Basic Metabolic Panel: Recent Labs  Lab 07/06/21 0620 07/07/21 0341 07/08/21 0314 07/09/21 0343 07/10/21 0453  NA 136 135 137 135 137  K 3.7 4.2 3.8 3.7 3.4*  CL 103 106 102 101 101  CO2 '26 26 27 23 28  '$ GLUCOSE 74 133* 77 102* 80  BUN 7* '9 10 9 '$ 7*  CREATININE 0.76 0.85 0.85 0.76 0.69  CALCIUM 8.0* 8.4* 8.2* 8.0* 8.2*  MG  --  1.7 2.0 1.9 1.8  PHOS  --  3.3 2.8 2.9 2.8   Liver Function Tests: Recent Labs  Lab 07/03/21 1342  AST 22  ALT 19  ALKPHOS 104  BILITOT 1.3*  PROT 5.6*  ALBUMIN 2.7*   No results for input(s): LIPASE, AMYLASE in the last 168 hours. No results for input(s): AMMONIA in the last 168 hours. Coagulation Profile: No results for input(s): INR, PROTIME in the last 168 hours. CBC: Recent Labs  Lab 07/03/21 1342 07/04/21 0519 07/06/21 0620 07/07/21 0341  07/08/21 0314 07/09/21 0343 07/10/21 0453  WBC 8.3   < > 6.6 10.3 10.1 11.5* 9.0  NEUTROABS 5.9  --   --   --   --   --   --   HGB 7.8*   < > 7.5* 7.8* 7.8* 8.9* 8.4*  HCT 28.3*   < > 27.6* 29.0* 28.8* 33.3* 30.9*  MCV 75.3*   < > 74.2* 76.1* 75.2* 75.0* 74.5*  PLT 253   < > 228 231 255 272 235   < > = values in this interval not displayed.   Cardiac Enzymes: No results for input(s): CKTOTAL, CKMB, CKMBINDEX, TROPONINI in the last 168 hours. BNP: Invalid input(s): POCBNP CBG: Recent Labs  Lab 07/04/21 0755 07/04/21 1131 07/05/21 0732  GLUCAP 85 83 78   HbA1C: No results for input(s): HGBA1C in the last 72 hours. Urine analysis:  Component Value Date/Time   COLORURINE YELLOW 03/26/2014 1346   APPEARANCEUR CLEAR 03/26/2014 1346   LABSPEC 1.011 03/26/2014 1346   PHURINE 5.0 03/26/2014 1346   GLUCOSEU NEGATIVE 03/26/2014 1346   HGBUR MODERATE (A) 03/26/2014 1346   BILIRUBINUR NEGATIVE 03/26/2014 1346   KETONESUR NEGATIVE 03/26/2014 1346   PROTEINUR NEGATIVE 03/26/2014 1346   UROBILINOGEN 0.2 03/26/2014 1346   NITRITE NEGATIVE 03/26/2014 1346   LEUKOCYTESUR NEGATIVE 03/26/2014 1346   Sepsis Labs: '@LABRCNTIP'$ (procalcitonin:4,lacticidven:4) ) Recent Results (from the past 240 hour(s))  SARS CORONAVIRUS 2 (Araceli Arango 6-24 HRS) Nasopharyngeal Nasopharyngeal Swab     Status: None   Collection Time: 07/03/21  7:45 PM   Specimen: Nasopharyngeal Swab  Result Value Ref Range Status   SARS Coronavirus 2 NEGATIVE NEGATIVE Final    Comment: (NOTE) SARS-CoV-2 target nucleic acids are NOT DETECTED.  The SARS-CoV-2 RNA is generally detectable in upper and lower respiratory specimens during the acute phase of infection. Negative results do not preclude SARS-CoV-2 infection, do not rule out co-infections with other pathogens, and should not be used as the sole basis for treatment or other patient management decisions. Negative results must be combined with clinical  observations, patient history, and epidemiological information. The expected result is Negative.  Fact Sheet for Patients: SugarRoll.be  Fact Sheet for Healthcare Providers: https://www.woods-mathews.com/  This test is not yet approved or cleared by the [place] FDA and  has been authorized for detection and/or diagnosis of SARS-CoV-2 by FDA under an Emergency Use Authorization (EUA). This EUA will remain  in effect (meaning this test can be used) for the duration of the COVID-19 declaration under Se ction 564(b)(1) of the Act, 21 U.S.C. section 360bbb-3(b)(1), unless the authorization is terminated or revoked sooner.  Performed at [facility], [place] [address], [place], [place] [address]   Surgical PCR screen     Status: None   Collection Time: 07/05/21  3:20 PM   Specimen: Nasal Mucosa; Nasal Swab  Result Value Ref Range Status   MRSA, PCR NEGATIVE NEGATIVE Final   Staphylococcus aureus NEGATIVE NEGATIVE Final    Comment: (NOTE) The Xpert SA Assay (FDA approved for NASAL specimens in patients 92 years of age and older), is one component of a comprehensive surveillance program. It is not intended to diagnose infection nor to guide or monitor treatment. Performed at [facility], [address] [place], [place] [address]      Scheduled Meds:  Chlorhexidine Gluconate Cloth  6 each Topical Daily   furosemide  20 mg Intravenous Daily   levothyroxine  50 mcg Intravenous Daily   metoCLOPramide (REGLAN) injection  10 mg Intravenous Q6H   metoprolol succinate  12.5 mg Oral BID   metoprolol tartrate  5 mg Intravenous Q6H   pantoprazole (PROTONIX) IV  40 mg Intravenous Q12H   polyethylene glycol  17 g Oral BID   potassium chloride  10 mEq Oral BID   rivaroxaban  20 mg Oral Q supper   Continuous Infusions:  sodium chloride 50 mL/hr at 07/10/21 [id number]    Procedures/Studies: DG Abdomen 1 View  Result Date: 07/03/2021 CLINICAL  DATA:  Constipation EXAM: ABDOMEN - 1 VIEW COMPARISON:  CT pelvis June 28, 2021 FINDINGS: The bowel gas pattern is normal. Moderate volume of formed stool throughout the colon. Degenerative changes bilateral hips. Lumbar spondylosis. IMPRESSION: Nonobstructive bowel gas pattern. Moderate volume of formed stool throughout the colon. Electronically Signed   By: Dahlia Bailiff MD   On: 07/03/2021 17:45  Orson Eva, DO  [facility]  If 7PM-7AM, please contact night-coverage [email or web address] Password [email or web address] 07/10/2021, 1:26 PM   LOS: 4 days

## 2021-07-10 NOTE — Progress Notes (Signed)
4 Days Post-Op  Subjective: Patient resting comfortably.  Denies nausea or vomiting.  Objective: Vital signs in last 24 hours: Temp:  [97.4 F (36.3 C)-98.9 F (37.2 C)] 97.8 F (36.6 C) (08/16 0500) Pulse Rate:  [42-114] 85 (08/16 0700) Resp:  [11-15] 13 (08/16 0700) BP: (97-143)/(41-115) 103/63 (08/16 0700) SpO2:  [90 %-100 %] 94 % (08/16 0700) Weight:  [114.9 kg] 114.9 kg (08/16 0500) Last BM Date: 07/08/21  Intake/Output from previous day: 08/15 0701 - 08/16 0700 In: 1224.9 [I.V.:955; IV Piggyback:269.9] Out: 2250 [Urine:2250] Intake/Output this shift: No intake/output data recorded.  General appearance: cooperative, fatigued, and no distress GI: Soft, incision healing well.  Occasional bowel sounds appreciated.  Nontender.  Lab Results:  Recent Labs    07/09/21 0343 07/10/21 0453  WBC 11.5* 9.0  HGB 8.9* 8.4*  HCT 33.3* 30.9*  PLT 272 235   BMET Recent Labs    07/09/21 0343 07/10/21 0453  NA 135 137  K 3.7 3.4*  CL 101 101  CO2 23 28  GLUCOSE 102* 80  BUN 9 7*  CREATININE 0.76 0.69  CALCIUM 8.0* 8.2*   PT/INR No results for input(s): LABPROT, INR in the last 72 hours.  Studies/Results: No results found.  Anti-infectives: Anti-infectives (From admission, onward)    Start     Dose/Rate Route Frequency Ordered Stop   07/06/21 1200  ertapenem (INVANZ) 1,000 mg in sodium chloride 0.9 % 100 mL IVPB        1 g 200 mL/hr over 30 Minutes Intravenous On call to O.R. 07/05/21 1453 07/06/21 1113   07/06/21 0941  sodium chloride 0.9 % with ertapenem [facility]) ADS Med       Note to Pharmacy: Augustin Schooling   : cabinet override      07/06/21 0941 07/06/21 1058   07/05/21 1600  neomycin (MYCIFRADIN) tablet 1,000 mg  Status:  Discontinued        1,000 mg Oral Every 4 hours 07/05/21 1438 07/06/21 0359   07/05/21 1530  metroNIDAZOLE (FLAGYL) IVPB 500 mg        500 mg 100 mL/hr over 60 Minutes Intravenous Every 4 hours 07/05/21 1438 07/06/21 0126        Assessment/Plan: s/p Procedure(s): RIGHT HEMICOLECTOMY Impression: Postoperative day 4.  Still awaiting full return of bowel function.  Patient resistant to getting out of bed.  We will start MiraLAX.  Patient does have a recorded bowel movement.  We will continue clear liquid diet for now.  Would assess patient for SNF.  Final pathology pending.  Initial colon biopsy positive for adenocarcinoma.  LOS: 4 days    [name] Signs 07/10/2021

## 2021-07-10 NOTE — Progress Notes (Signed)
Progress Note  Patient Name: Lauren [name] Date of Encounter: 07/10/2021  [facility] Cardiologist: Carlyle Dolly, MD   Subjective  Still having lots of abdominal discomfort.  Not sure if she has been out of bed yet.  No bowel movement. No chest discomfort.  No complaints of dyspnea.   Inpatient Medications    Scheduled Meds:  Chlorhexidine Gluconate Cloth  6 each Topical Daily   furosemide  20 mg Intravenous Daily   levothyroxine  50 mcg Intravenous Daily   metoCLOPramide (REGLAN) injection  10 mg Intravenous Q6H   metoprolol tartrate  5 mg Intravenous Q6H   mupirocin ointment  1 application Nasal BID   pantoprazole (PROTONIX) IV  40 mg Intravenous Q12H   polyethylene glycol  17 g Oral BID   rivaroxaban  20 mg Oral Q supper   Continuous Infusions:  sodium chloride 50 mL/hr at 07/10/21 0803   PRN Meds: acetaminophen **OR** acetaminophen, [COMPLETED] ketorolac **FOLLOWED BY** ketorolac, magnesium hydroxide, metoprolol tartrate, morphine injection, ondansetron **OR** ondansetron (ZOFRAN) IV, simethicone, traMADol   Vital Signs    Vitals:   07/10/21 0500 07/10/21 0600 07/10/21 0700 07/10/21 0800  BP: (!) 132/59 (!) 139/115 103/63 130/79  Pulse: 68 (!) 48 85 85  Resp: '13 15 13 15  '$ Temp: 97.8 F (36.6 C)   98 F (36.7 C)  TempSrc: Oral   Oral  SpO2: 95% 90% 94% 95%  Weight: 114.9 kg     Height:        Intake/Output Summary (Last 24 hours) at 07/10/2021 0906 Last data filed at 07/10/2021 0500 Gross per 24 hour  Intake 1224.85 ml  Output 2250 ml  Net -1025.15 ml   Last 3 Weights 07/10/2021 07/06/2021 07/03/2021  Weight (lbs) 253 lb 4.9 oz 240 lb 15.4 oz 273 lb 13 oz  Weight (kg) 114.9 kg 109.3 kg 124.2 kg      Telemetry    Atrial fibrillation, occasional PVCs, heart rate still slightly increased- Personally Reviewed   Physical Exam  Large lady still slightly pale lying in bed. GEN: No acute distress.   Neck: No JVD Cardiac: Irregular with a rate over  100 from, no murmurs, rubs, or gallops.  Respiratory: Clear to auscultation bilaterally. GI: Bowel sounds noted. MS: Large legs.  Edema improved Neuro:  Nonfocal  Psych: Normal affect   Labs    High Sensitivity Troponin:  No results for input(s): TROPONINIHS in the last 720 hours.    Chemistry Recent Labs  Lab 07/03/21 1342 07/04/21 0519 07/08/21 0314 07/09/21 0343 07/10/21 0453  NA 135   < > 137 135 137  K 3.8   < > 3.8 3.7 3.4*  CL 103   < > 102 101 101  CO2 27   < > '27 23 28  '$ GLUCOSE 105*   < > 77 102* 80  BUN 13   < > 10 9 7*  CREATININE 0.73   < > 0.85 0.76 0.69  CALCIUM 8.3*   < > 8.2* 8.0* 8.2*  PROT 5.6*  --   --   --   --   ALBUMIN 2.7*  --   --   --   --   AST 22  --   --   --   --   ALT 19  --   --   --   --   ALKPHOS 104  --   --   --   --   BILITOT 1.3*  --   --   --   --  GFRNONAA >60   < > >60 >60 >60  ANIONGAP 5   < > '8 11 8   '$ < > = values in this interval not displayed.     Hematology Recent Labs  Lab 07/08/21 0314 07/09/21 0343 07/10/21 0453  WBC 10.1 11.5* 9.0  RBC 3.83* 4.44 4.15  HGB 7.8* 8.9* 8.4*  HCT 28.8* 33.3* 30.9*  MCV 75.2* 75.0* 74.5*  MCH 20.4* 20.0* 20.2*  MCHC 27.1* 26.7* 27.2*  RDW 24.0* 24.0* 24.2*  PLT 255 272 235    BNPNo results for input(s): BNP, PROBNP in the last 168 hours.   DDimer No results for input(s): DDIMER in the last 168 hours.   Radiology    No results found.     Assessment & Plan   Chronic atrial fibrillation          recent issues with rate control due to noncompliance as well as her significant anemia initially presented with a hemoglobin just over 5 for which she received a transfusion with current hemoglobin 7.6 2.  Left ventricular systolic dysfunction, significant suspect in part related to lack of medication with inadequate ventricular rate control. (EF 30% at [facility]) 3.  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  Chronic lymphedema 5  Thyroid  Disease- euthyroid on replacement (labs at [facility]) 6.  Iron deficiency anemia secondary to colon mass + anticoag  Plan: Patient's heart rates still not ideal.  Given additional digoxin 0.125 mg.  Since there are bowel sounds we will initiate oral metoprolol succinate and low-dose with plans to increase as tolerated.  Recommend giving iron injections while still in the hospital.  As she has stabilizes and had vasodilator therapy for her reduced ejection fraction.  This will require outpatient follow-up and dose adjustment.  For questions or updates, please contact [place] Please consult [email or web address] for contact info under        Signed, Abel Presto, MD  07/10/2021, 9:06 AM

## 2021-07-11 DIAGNOSIS — C189 Malignant neoplasm of colon, unspecified: Secondary | ICD-10-CM | POA: Diagnosis not present

## 2021-07-11 DIAGNOSIS — I4891 Unspecified atrial fibrillation: Secondary | ICD-10-CM | POA: Diagnosis not present

## 2021-07-11 DIAGNOSIS — I890 Lymphedema, not elsewhere classified: Secondary | ICD-10-CM | POA: Diagnosis not present

## 2021-07-11 DIAGNOSIS — I5042 Chronic combined systolic (congestive) and diastolic (congestive) heart failure: Secondary | ICD-10-CM | POA: Diagnosis not present

## 2021-07-11 LAB — CBC
HCT: 31.4 % — ABNORMAL LOW (ref 36.0–46.0)
Hemoglobin: 8.7 g/dL — ABNORMAL LOW (ref 12.0–15.0)
MCH: 20.8 pg — ABNORMAL LOW (ref 26.0–34.0)
MCHC: 27.7 g/dL — ABNORMAL LOW (ref 30.0–36.0)
MCV: 74.9 fL — ABNORMAL LOW (ref 80.0–100.0)
Platelets: 261 10*3/uL (ref 150–400)
RBC: 4.19 MIL/uL (ref 3.87–5.11)
RDW: 25 % — ABNORMAL HIGH (ref 11.5–15.5)
WBC: 9.2 10*3/uL (ref 4.0–10.5)
nRBC: 0 % (ref 0.0–0.2)

## 2021-07-11 LAB — BASIC METABOLIC PANEL
Anion gap: 6 (ref 5–15)
BUN: 9 mg/dL (ref 8–23)
CO2: 25 mmol/L (ref 22–32)
Calcium: 7.7 mg/dL — ABNORMAL LOW (ref 8.9–10.3)
Chloride: 103 mmol/L (ref 98–111)
Creatinine, Ser: 0.82 mg/dL (ref 0.44–1.00)
GFR, Estimated: >60 mL/min (ref >60)
Glucose, Bld: 83 mg/dL (ref 70–99)
Potassium: 3.5 mmol/L (ref 3.5–5.1)
Sodium: 134 mmol/L — ABNORMAL LOW (ref 135–145)

## 2021-07-11 MED ORDER — POTASSIUM CHLORIDE 10 MEQ/100ML IV SOLN
10.0000 meq | INTRAVENOUS | Status: AC
Start: 2021-07-11 — End: 2021-07-12
  Administered 2021-07-11: 10 meq via INTRAVENOUS
  Filled 2021-07-11: qty 100

## 2021-07-11 MED ORDER — ALUM & MAG HYDROXIDE-SIMETH 200-200-20 MG/5ML PO SUSP: 30.0000 mL | Freq: Four times a day (QID) | ORAL | Status: DC | PRN

## 2021-07-11 MED ORDER — MELATONIN 3 MG PO TABS: 3.0000 mg | ORAL_TABLET | Freq: Every evening | ORAL | Status: DC | PRN

## 2021-07-11 MED ORDER — DIGOXIN 125 MCG PO TABS
0.1250 mg | ORAL_TABLET | Freq: Once | ORAL | Status: AC
  Administered 2021-07-11: 0.125 mg via ORAL
  Filled 2021-07-11: qty 1

## 2021-07-11 MED ORDER — LACTATED RINGERS IV SOLN: INTRAVENOUS | Status: DC

## 2021-07-11 MED ORDER — METOPROLOL SUCCINATE ER 25 MG PO TB24
25.0000 mg | ORAL_TABLET | Freq: Two times a day (BID) | ORAL | Status: DC
  Administered 2021-07-11 – 2021-07-17 (×9): 25 mg via ORAL
  Filled 2021-07-11 (×12): qty 1

## 2021-07-11 NOTE — Progress Notes (Signed)
Ate cup of pudding for lunch and no vomiting until just now.  Will give zofran

## 2021-07-11 NOTE — Progress Notes (Signed)
[name] IV infiltrated. Pt refusing to have staff place another IV at this time. Pt educated of risk of not having IV access. Pt states "I am over it and I am tired. We can try in the morning after I speak to the doctor." MD notified about situation.

## 2021-07-11 NOTE — Final Progress Note (Signed)
Progress Note  Patient Name: Lauren [name] Date of Encounter: 07/11/2021  [facility] Cardiologist: Carlyle Dolly, MD   Subjective   Feels a bit anxious and depressed today.  Is concerned about having to go to an ECF rather than home upon discharge. No chest discomfort or dyspnea.  Says that she feels a bit nauseated and has vomited several times after trying to drink liquids.  Inpatient Medications    Scheduled Meds:  Chlorhexidine Gluconate Cloth  6 each Topical Daily   furosemide  20 mg Intravenous Daily   levothyroxine  50 mcg Intravenous Daily   metoprolol succinate  12.5 mg Oral BID   pantoprazole (PROTONIX) IV  40 mg Intravenous Q12H   polyethylene glycol  17 g Oral BID   potassium chloride  10 mEq Oral BID   rivaroxaban  20 mg Oral Q supper   Continuous Infusions:  sodium chloride 50 mL/hr at 07/11/21 0521   PRN Meds: acetaminophen **OR** acetaminophen, [COMPLETED] ketorolac **FOLLOWED BY** ketorolac, magnesium hydroxide, melatonin, metoprolol tartrate, ondansetron **OR** ondansetron (ZOFRAN) IV, simethicone, traMADol   Vital Signs    Vitals:   07/10/21 1800 07/10/21 2015 07/10/21 2330 07/11/21 0425  BP: (!) 143/56  115/69 115/68  Pulse: 82 71 (!) 101 97  Resp: '12 15 19 19  '$ Temp:   98.5 F (36.9 C) 98 F (36.7 C)  TempSrc:   Oral   SpO2: 97% 94% 97% 98%  Weight:      Height:        Intake/Output Summary (Last 24 hours) at 07/11/2021 0845 Last data filed at 07/11/2021 0555 Gross per 24 hour  Intake 884.89 ml  Output 3000 ml  Net -2115.11 ml   Last 3 Weights 07/10/2021 07/06/2021 07/03/2021  Weight (lbs) 253 lb 4.9 oz 240 lb 15.4 oz 273 lb 13 oz  Weight (kg) 114.9 kg 109.3 kg 124.2 kg      Telemetry    Atrial fibrillation, occasional increasing rate but overall rate is better.  No significant ventricular ectopy- Personally Reviewed   Physical Exam  Less pale, more awake, watching TV as I enter the room. Neck: No obvious jugular venous  distention Chest: Clear Heart: Irregular rhythm but rate is better controlled.  No obvious murmur or gallop noted. Abd: Non tender to gentle palpation. BS+ Extremity: Large legs but no significant edema. Neuro: Awake and alert.  No focal deficits.  Speech pattern seems normal.  Labs    High Sensitivity Troponin:  No results for input(s): TROPONINIHS in the last 720 hours.    Chemistry Recent Labs  Lab 07/09/21 0343 07/10/21 0453 07/11/21 0454  NA 135 137 134*  K 3.7 3.4* 3.5  CL 101 101 103  CO2 '23 28 25  '$ GLUCOSE 102* 80 83  BUN 9 7* 9  CREATININE 0.76 0.69 0.82  CALCIUM 8.0* 8.2* 7.7*  GFRNONAA >60 >60 >60  ANIONGAP '11 8 6     '$ Hematology Recent Labs  Lab 07/09/21 0343 07/10/21 0453 07/11/21 0454  WBC 11.5* 9.0 9.2  RBC 4.44 4.15 4.19  HGB 8.9* 8.4* 8.7*  HCT 33.3* 30.9* 31.4*  MCV 75.0* 74.5* 74.9*  MCH 20.0* 20.2* 20.8*  MCHC 26.7* 27.2* 27.7*  RDW 24.0* 24.2* 25.0*  PLT 272 235 261    BNPNo results for input(s): BNP, PROBNP in the last 168 hours.   DDimer No results for input(s): DDIMER in the last 168 hours.    Assessment & Plan    Chronic atrial fibrillation  recent issues with rate control due to noncompliance as well as her significant anemia initially presented with a hemoglobin just over 5 for which she received a transfusion with current hemoglobin 7.6 2.  Left ventricular systolic dysfunction, significant suspect in part related to lack of medication with inadequate ventricular rate control. (EF 30% at [facility]) 3.  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  Chronic lymphedema 5  Thyroid Disease- euthyroid on replacement (labs at [facility]) 6.  Iron deficiency anemia secondary to colon mass + anticoag  Recommendation: Will give 1 more dose of digoxin 0.125 mg today.  We will increase her metoprolol succinate from 12 and half milligrams twice daily to 25 mg twice daily.  Because of her LV dysfunction as  shown at the [facility] echo just prior to admission I would try to avoid diltiazem for rate control and use beta-blocker.  Her potassium level is low range of normal and this needs to be watched closely.  As she resumes normal bowel function can be switched from intravenous to oral diuretic.  Prior to admission she was on torsemide 20 mg daily.  As I noted yesterday I would administer parenteral iron while in the hospital to reestablish her iron stores. If her blood pressure tolerates I would eventually start on vasodilator therapy in the form of ACE or ARB.  (Not likely to be able to afford Entresto).  I noticed also that her thyroid dose was 0.1 mg of levothyroxine prior to admission and she is currently getting a lower dose.  The hospitalist can decide on appropriate dosing. We will sign off from active daily care.  The patient should however have an appointment with Dr. Harl Bowie as an outpatient for follow-up of her left ventricular dysfunction.  Hopefully with correction of her anemia, with rate control and change in medication on repeat echo in several months her ejection fraction will return to or towards normal.  Please feel free to call if cardiac concerns develop.  For questions or updates, please contact [place] Please consult [email or web address] for contact info under        Signed, Abel Presto, MD  07/11/2021, 8:45 AM

## 2021-07-11 NOTE — Plan of Care (Signed)
  Problem: Acute Rehab PT Goals(only PT should resolve) Goal: Pt Will Go Supine/Side To Sit Outcome: Progressing Flowsheets (Taken 07/11/2021 1102) Pt will go Supine/Side to Sit: with minimal assist Goal: Pt Will Go Sit To Supine/Side Outcome: Progressing Flowsheets (Taken 07/11/2021 1102) Pt will go Sit to Supine/Side: with minimal assist Goal: Patient Will Transfer Sit To/From Stand Outcome: Progressing Flowsheets (Taken 07/11/2021 1102) Patient will transfer sit to/from stand: with minimal assist Goal: Pt Will Transfer Bed To Chair/Chair To Bed Outcome: Progressing Flowsheets (Taken 07/11/2021 1102) Pt will Transfer Bed to Chair/Chair to Bed: with min assist Goal: Pt Will Ambulate Outcome: Progressing Flowsheets (Taken 07/11/2021 1102) Pt will Ambulate:  15 feet  with minimal assist  with rolling walker   Tori Maury Bamba PT, DPT 07/11/21, 11:02 AM

## 2021-07-11 NOTE — Evaluation (Signed)
Physical Therapy Evaluation Patient Details Name: Lauren [name] MRN: [id number] DOB: [birth year] Today's Date: 07/11/2021   History of Present Illness  75 yo female with c/o constipation and rectal pain. 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 Pt s/p R hemicolectomy 07/06/21. PMH: a fib, morbid obesity, diastolic CHF, hypertension, lymphedema   Clinical Impression  Pt admitted with above diagnosis. Pt reports being independent at baseline with self care tasks, ambulation, clearing 1 step to enter home, doesn't own DME, friend from [place] for pt and pt uses EMS/cab for transportation. Pt currently limited during eval due to HR and actively vomiting once seated EOB. Requiring mod A with bed mobility, cued for log rolling to protect abdomen and minimize discomfort. Pt unable to stand or take steps due to HR up to 157 while seated EOB. Pt from home alone, recommending SNF prior to d/c home alone due to being independent at baseline without DME. Pt currently with functional limitations due to the deficits listed below (see PT Problem List). Pt will benefit from skilled PT to increase their independence and safety with mobility to allow discharge to the venue listed below.       Follow Up Recommendations SNF    Equipment Recommendations  Other (comment) (defer to next venue)    Recommendations for Other Services       Precautions / Restrictions Precautions Precautions: Fall Precaution Comments: nasuea/vomiting Restrictions Weight Bearing Restrictions: No      Mobility  Bed Mobility Overal bed mobility: Needs Assistance Bed Mobility: Supine to Sit;Sit to Supine  Supine to sit: Mod assist;HOB elevated Sit to supine: Mod assist   General bed mobility comments: assist to mobilize each LE to EOB then to upright trunk with HOB elevated, VCs for sequencing and hand placement, heavy use of bedrail to assist, max A +bed assist with use of  bedpad to scoot up in bed    Transfers  General transfer comment: unable due to HR 157 while seated EOB, actively vomiting  Ambulation/Gait  General Gait Details: unable due to HR 157 while seated EOB, actively vomiting  Stairs            Wheelchair Mobility    Modified Rankin (Stroke Patients Only)       Balance Overall balance assessment: Needs assistance Sitting-balance support: Feet supported Sitting balance-Leahy Scale: Fair Sitting balance - Comments: seated EOB         Pertinent Vitals/Pain      Home Living Family/patient expects to be discharged to:: Private residence Living Arrangements: Alone Available Help at Discharge: Friend(s) Type of Home: House Home Access: Stairs to enter   Technical brewer of Steps: 1 Home Layout: One level Home Equipment: None Additional Comments: Pt reports just got lift chair, hasn't used it yet    Prior Function Level of Independence: Needs assistance   Gait / Transfers Assistance Needed: Pt reports ambulates household distances independently, able to complete transfers with increased time  ADL's / Homemaking Assistance Needed: Pt reports indpendent with microwave meals, sponge bath, dressing, cleaning.  Comments: pt reports friend groceries shops, uses EMS/cab for transportation     Hand Dominance   Dominant Hand: Right    Extremity/Trunk Assessment   Upper Extremity Assessment Upper Extremity Assessment: Overall WFL for tasks assessed    Lower Extremity Assessment Lower Extremity Assessment: Generalized weakness;RLE deficits/detail;LLE deficits/detail RLE Deficits / Details: ankle AROM WNL, knee and hip AROM ~50%, grossly 3+/5, lymphedema noted throughout entire LE, denies numbness/tingling  RLE Sensation: WNL RLE Coordination: WNL LLE Deficits / Details: ankle AROM WNL, knee and hip AROM ~50%, grossly 3+/5, lymphedema noted throughout entire LE, denies numbness/tingling LLE Sensation: WNL LLE  Coordination: WNL    Cervical / Trunk Assessment Cervical / Trunk Assessment: Normal  Communication   Communication: No difficulties  Cognition Arousal/Alertness: Awake/alert Behavior During Therapy: WFL for tasks assessed/performed Overall Cognitive Status: Within Functional Limits for tasks assessed               General Comments General comments (skin integrity, edema, etc.): [name] dressing intact. HR 105 supine at rest, up to 157 once seated EOB but comes down to 130s, back to 103 once supine in bed with all needs in reach- RN notified    Exercises     Assessment/Plan    PT Assessment Patient needs continued PT services  PT Problem List Decreased strength;Decreased range of motion;Decreased activity tolerance;Decreased balance;Decreased mobility;Decreased knowledge of use of DME;Obesity;Decreased skin integrity;Pain       PT Treatment Interventions DME instruction;Gait training;Functional mobility training;Therapeutic activities;Therapeutic exercise;Balance training;Cognitive remediation;Patient/family education    PT Goals (Current goals can be found in the Care Plan section)  Acute Rehab PT Goals Patient Stated Goal: "get a roommate to assist pt at home" PT Goal Formulation: With patient Time For Goal Achievement: 07/25/21 Potential to Achieve Goals: Good    Frequency Min 3X/week   Barriers to discharge        Co-evaluation               AM-PAC PT "6 Clicks" Mobility  Outcome Measure Help needed turning from your back to your side while in a flat bed without using bedrails?: A Lot Help needed moving from lying on your back to sitting on the side of a flat bed without using bedrails?: A Lot Help needed moving to and from a bed to a chair (including a wheelchair)?: A Lot Help needed standing up from a chair using your arms (e.g., wheelchair or bedside chair)?: A Lot Help needed to walk in hospital room?: A Lot Help needed climbing 3-5 steps with a  railing? : A Lot 6 Click Score: 12    End of Session   Activity Tolerance: Other (comment) (limited by HR and vomiting) Patient left: in bed;with call bell/phone within reach;with bed alarm set Nurse Communication: Mobility status;Other (comment) (HR, vomiting) PT Visit Diagnosis: Other abnormalities of gait and mobility (R26.89);Muscle weakness (generalized) (M62.81)    Time: [id number] PT Time Calculation (min) (ACUTE ONLY): 24 min   Charges:   PT Evaluation $PT Eval Low Complexity: 1 Low PT Treatments $Therapeutic Activity: 8-22 mins         Tori Courtez Twaddle PT, DPT 07/11/21, 10:59 AM

## 2021-07-11 NOTE — Progress Notes (Signed)
   07/11/21 0954 07/11/21 1008  Assess: MEWS Score  BP (!) 107/54 136/65  Pulse Rate (!) 111 86  SpO2  --  94 %  Assess: MEWS Score  MEWS Temp 0 0  MEWS Systolic 0 0  MEWS Pulse 2 0  MEWS RR 0 0  MEWS LOC 0 0  MEWS Score 2 0  MEWS Score Color Yellow Green  Assess: if the MEWS score is Yellow or Red  Were vital signs taken at a resting state?  --  Yes  Focused Assessment  --  Change from prior assessment (see assessment flowsheet)  Early Detection of Sepsis Score *See Row Information*  --  Low  MEWS guidelines implemented *See Row Information*  --  No, vital signs rechecked  Treat  MEWS Interventions  --  Administered scheduled meds/treatments  Document  Patient Outcome  --  Stabilized after interventions  Progress note created (see row info)  --  Yes

## 2021-07-11 NOTE — Progress Notes (Signed)
   07/11/21 0954  Assess: MEWS Score  Temp 98.7 F (37.1 C)  BP (!) 107/54  Pulse Rate (!) 111  Resp 16  SpO2 94 %  O2 Device Room Air  Assess: MEWS Score  MEWS Temp 0  MEWS Systolic 0  MEWS Pulse 2  MEWS RR 0  MEWS LOC 0  MEWS Score 2  MEWS Score Color Yellow  Assess: if the MEWS score is Yellow or Red  Were vital signs taken at a resting state? Yes  Focused Assessment Change from prior assessment (see assessment flowsheet)  Early Detection of Sepsis Score *See Row Information* Low  MEWS guidelines implemented *See Row Information* No, vital signs rechecked  Treat  MEWS Interventions Administered scheduled meds/treatments (afib , gave scheduled digoxin)  Document  Patient Outcome Stabilized after interventions  Progress note created (see row info) Yes

## 2021-07-11 NOTE — Progress Notes (Signed)
PROGRESS NOTE  Lauren [name] [id number] DOB: Nov 19, 1946 DOA: 07/03/2021 PCP: Pcp, No  Brief History:  75 year old female with a history of atrial fibrillation, morbid obesity, diastolic CHF, hypertension, lymphedema presenting with constipation and rectal pain of 2 day duration.  The patient was recently admitted to [facility] from 06/28/2021 to 06/30/2021 for constipation.  During that admission, the patient was noted to have a hemoglobin of 5.6.  She was transfused with 2 units PRBC.  Her discharge hemoglobin was 8.  She had a CT of the abdomen and pelvis which showed cecal thickening raising questions of a possible cecal mass with small adjacent lymph nodes.  There is also skin thickening over her pannus.  The patient was given cathartics and ultimately had a bowel movement.  The patient was scheduled to have colonoscopy performed by general surgery, but she left AGAINST MEDICAL ADVICE prior to this being done.  An echocardiogram was performed on 06/28/2021 which showed EF 30%, mild to moderate MR, and normal RV. The patient states that she has not been fully compliant with all her prescription medications.  She has not taken her prescription medicines including her torsemide, Xarelto, and diltiazem since her discharge from [facility].  Even prior to that, patient states that she has not been fully compliant with her medications.  She states that she does not use any cathartics at home.  She denies any hematochezia, melena, hematemesis. In the emergency department, patient was afebrile and hemodynamically stable.  She was noted to have atrial fibrillation with RVR.  Oxygen saturation was 96% on room air.  BMP was unremarkable.  WBC 8.3, hemoglobin 7.8, platelets 265,000.  LFTs were unremarkable.   Assessment/Plan: Atrial fibrillation with RVR -Initially on metoprolol succinate 25 mg bid -digoxin added by cardiology -06/29/21 Echo EF 30%, mild-mod MR -Soft blood pressures limit titration of her  chronotropic medications intially -Personally reviewed EKG--atrial fibrillation, nonspecific T wave change -cardiology consult appreciated -8/14 started IV lopressor as pt not tolerating po>>>transitioning to po metoprolol succinate by cardiology -8/16--digoxin x 1 -Anticoagulated with Xarelto   Abnormal CT abdomen/cecal mass -GI consult appreciated--discussed with Dr. Abbey Chatters -06/28/2021 CT abdomen as discussed above -07/05/21 colonoscopy--partially obstructing cecal mass -general surgery consult appreciated -R-hemicolectomy 07/06/21--Dr. Arnoldo Morale -discussed with Dr. 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 Arnoldo Morale initially -minimize opioids as she became very somnolent with combo on 07/03/21 -d/c ativan as pt is somnolent with morphine and ativan   Iron deficiency anemia -06/29/2021 iron saturation 4, ferritin 5 -dose nulecit x 2   Morbid obesity -BMI 50.08 -Lifestyle modification   Hypothyroidism -Continue Synthroid   Lymphedema -Currently on IV Lasix, transition back to torsemide once able to tolerate p.o.   Chronic systolic and Diastolic CHF -clinically euvolemic -restart home torsemide when tolerating po -lasix 20 mg IV daily for now         Status is: Inpatient   The patient will require care spanning > 2 midnights and should be moved to inpatient because: Inpatient level of care appropriate due to severity of illness   Dispo: The patient is from: Home  Anticipated d/c is to: Home              Patient currently is not medically stable to  d/c.              Difficult to place patient No               Family Communication:  no Family at bedside   Consultants:  GI, general surgery   Code Status:  FULL    DVT Prophylaxis: Xarelto restarted 8/14     Procedures: As Listed in Progress Note Above   Antibiotics: None     Subjective: Patient complains of abd pain, controlled.  Having nausea.  No BM yet.  Occasional flatus.  Denies f/c, cp, sob.  Objective: Vitals:   07/11/21 0954 07/11/21 1008 07/11/21 1200 07/11/21 1735  BP: (!) 107/54 136/65 112/84 113/63  Pulse: (!) 111 86 97 98  Resp: 16  19   Temp: 98.7 F (37.1 C)  98.2 F (36.8 C)   TempSrc: Oral  Oral   SpO2: 94% 94% 98%   Weight:      Height:        Intake/Output Summary (Last 24 hours) at 07/11/2021 1959 Last data filed at 07/11/2021 1822 Gross per 24 hour  Intake 596 ml  Output 1800 ml  Net -1204 ml   Weight change:  Exam:  General:  Pt is alert, follows commands appropriately, not in acute distress HEENT: No icterus, No thrush, No neck mass, [place]/AT Cardiovascular: RRR, S1/S2, no rubs, no gallops Respiratory: bibasilar crackles. No wheeze Abdomen: Soft/+BS, non tender, non distended, no guarding Extremities: Nonpitting edema, No lymphangitis, No petechiae, No rashes, no synovitis   Data Reviewed: I have personally reviewed following labs and imaging studies Basic Metabolic Panel: Recent Labs  Lab 07/07/21 0341 07/08/21 0314 07/09/21 0343 07/10/21 0453 07/11/21 0454  NA 135 137 135 137 134*  K 4.2 3.8 3.7 3.4* 3.5  CL 106 102 101 101 103  CO2 '26 27 23 28 25  '$ GLUCOSE 133* 77 102* 80 83  BUN '9 10 9 '$ 7* 9  CREATININE 0.85 0.85 0.76 0.69 0.82  CALCIUM 8.4* 8.2* 8.0* 8.2* 7.7*  MG 1.7 2.0 1.9 1.8  --   PHOS 3.3 2.8 2.9 2.8  --    Liver Function Tests: No results for input(s): AST, ALT, ALKPHOS, BILITOT, PROT, ALBUMIN in the last 168 hours.  No results for input(s): LIPASE, AMYLASE in the last 168 hours. No results for  input(s): AMMONIA in the last 168 hours. Coagulation Profile: No results for input(s): INR, PROTIME in the last 168 hours. CBC: Recent Labs  Lab 07/07/21 0341 07/08/21 0314 07/09/21 0343 07/10/21 0453 07/11/21 0454  WBC 10.3 10.1 11.5* 9.0 9.2  HGB 7.8* 7.8* 8.9* 8.4* 8.7*  HCT 29.0* 28.8* 33.3* 30.9* 31.4*  MCV 76.1* 75.2* 75.0* 74.5* 74.9*  PLT 231 255 272 235 261   Cardiac Enzymes: No results for input(s): CKTOTAL, CKMB, CKMBINDEX, TROPONINI in the last 168 hours. BNP: Invalid input(s): POCBNP CBG: Recent Labs  Lab 07/05/21 0732  GLUCAP 78   HbA1C: No results for input(s): HGBA1C in the last 72 hours. Urine analysis:    Component Value Date/Time   COLORURINE YELLOW 03/26/2014 1346   APPEARANCEUR CLEAR 03/26/2014 1346   LABSPEC 1.011 03/26/2014 1346   PHURINE 5.0 03/26/2014 1346   GLUCOSEU NEGATIVE 03/26/2014 1346   HGBUR MODERATE (A) 03/26/2014 1346   BILIRUBINUR NEGATIVE 03/26/2014 1346   KETONESUR NEGATIVE 03/26/2014 1346  PROTEINUR NEGATIVE 03/26/2014 1346   UROBILINOGEN 0.2 03/26/2014 1346   NITRITE NEGATIVE 03/26/2014 1346   LEUKOCYTESUR NEGATIVE 03/26/2014 1346   Sepsis Labs: '@LABRCNTIP'$ (procalcitonin:4,lacticidven:4) ) Recent Results (from the past 240 hour(s))  SARS CORONAVIRUS 2 (TAT 6-24 HRS) Nasopharyngeal Nasopharyngeal Swab     Status: None   Collection Time: 07/03/21  7:45 PM   Specimen: Nasopharyngeal Swab  Result Value Ref Range Status   SARS Coronavirus 2 NEGATIVE NEGATIVE Final    Comment: (NOTE) SARS-CoV-2 target nucleic acids are NOT DETECTED.  The SARS-CoV-2 RNA is generally detectable in upper and lower respiratory specimens during the acute phase of infection. Negative results do not preclude SARS-CoV-2 infection, do not rule out co-infections with other pathogens, and should not be used as the sole basis for treatment or other patient management decisions. Negative results must be combined with clinical observations, patient  history, and epidemiological information. The expected result is Negative.  Fact Sheet for Patients: SugarRoll.be  Fact Sheet for Healthcare Providers: https://www.woods-mathews.com/  This test is not yet approved or cleared by the [place] FDA and  has been authorized for detection and/or diagnosis of SARS-CoV-2 by FDA under an Emergency Use Authorization (EUA). This EUA will remain  in effect (meaning this test can be used) for the duration of the COVID-19 declaration under Se ction 564(b)(1) of the Act, 21 U.S.C. section 360bbb-3(b)(1), unless the authorization is terminated or revoked sooner.  Performed at [facility], [place] [address] [place], [place] [address]   Surgical PCR screen     Status: None   Collection Time: 07/05/21  3:20 PM   Specimen: Nasal Mucosa; Nasal Swab  Result Value Ref Range Status   MRSA, PCR NEGATIVE NEGATIVE Final   Staphylococcus aureus NEGATIVE NEGATIVE Final    Comment: (NOTE) The Xpert SA Assay (FDA approved for NASAL specimens in patients 16 years of age and older), is one component of a comprehensive surveillance program. It is not intended to diagnose infection nor to guide or monitor treatment. Performed at [facility], [address], [place],  [address]      Scheduled Meds:  furosemide  20 mg Intravenous Daily   levothyroxine  50 mcg Intravenous Daily   metoprolol succinate  25 mg Oral BID   pantoprazole (PROTONIX) IV  40 mg Intravenous Q12H   polyethylene glycol  17 g Oral BID   potassium chloride  10 mEq Oral BID   rivaroxaban  20 mg Oral Q supper   Continuous Infusions:  sodium chloride 50 mL/hr at 07/11/21 [id number]    Procedures/Studies: DG Abdomen 1 View  Result Date: 07/03/2021 CLINICAL DATA:  Constipation EXAM: ABDOMEN - 1 VIEW COMPARISON:  CT pelvis June 28, 2021 FINDINGS: The bowel gas pattern is normal. Moderate volume of formed stool throughout the colon.  Degenerative changes bilateral hips. Lumbar spondylosis. IMPRESSION: Nonobstructive bowel gas pattern. Moderate volume of formed stool throughout the colon. Electronically Signed   By: Dahlia Bailiff MD   On: 07/03/2021 17:45    Kathie Dike, MD  [facility]  If 7PM-7AM, please contact night-coverage [email or web address]  07/11/2021, 7:59 PM   LOS: 5 days

## 2021-07-11 NOTE — Progress Notes (Signed)
5 Days Post-Op  Subjective: Patient states she is not sleeping well.  She is tolerating clear liquid diet this morning.  Objective: Vital signs in last 24 hours: Temp:  [97.6 F (36.4 C)-98.5 F (36.9 C)] 98 F (36.7 C) (08/17 0425) Pulse Rate:  [62-101] 97 (08/17 0425) Resp:  [12-19] 19 (08/17 0425) BP: (87-146)/(56-75) 115/68 (08/17 0425) SpO2:  [94 %-98 %] 98 % (08/17 0425) Last BM Date: 07/08/21  Intake/Output from previous day: 08/16 0701 - 08/17 0700 In: 884.9 [P.O.:240; I.V.:644.9] Out: 3000 [Urine:800; Emesis/NG [id number] Intake/Output this shift: Total I/O In: 116 [P.O.:116] Out: -   General appearance: alert, cooperative, and no distress GI: Soft, incision healing well.  Occasional bowel sounds appreciated.  No tenderness noted.  Lab Results:  Recent Labs    07/10/21 0453 07/11/21 0454  WBC 9.0 9.2  HGB 8.4* 8.7*  HCT 30.9* 31.4*  PLT 235 261   BMET Recent Labs    07/10/21 0453 07/11/21 0454  NA 137 134*  K 3.4* 3.5  CL 101 103  CO2 28 25  GLUCOSE 80 83  BUN 7* 9  CREATININE 0.69 0.82  CALCIUM 8.2* 7.7*   PT/INR No results for input(s): LABPROT, INR in the last 72 hours.  Studies/Results: No results found.  Anti-infectives: Anti-infectives (From admission, onward)    Start     Dose/Rate Route Frequency Ordered Stop   07/06/21 1200  ertapenem (INVANZ) 1,000 mg in sodium chloride 0.9 % 100 mL IVPB        1 g 200 mL/hr over 30 Minutes Intravenous On call to O.R. 07/05/21 1453 07/06/21 1113   07/06/21 0941  sodium chloride 0.9 % with ertapenem [facility]) ADS Med       Note to Pharmacy: Augustin Schooling   : cabinet override      07/06/21 0941 07/06/21 1058   07/05/21 1600  neomycin (MYCIFRADIN) tablet 1,000 mg  Status:  Discontinued        1,000 mg Oral Every 4 hours 07/05/21 1438 07/06/21 0359   07/05/21 1530  metroNIDAZOLE (FLAGYL) IVPB 500 mg        500 mg 100 mL/hr over 60 Minutes Intravenous Every 4 hours 07/05/21 1438 07/06/21 0126        Assessment/Plan: s/p Procedure(s): RIGHT HEMICOLECTOMY Impression: Postoperative day 5.  Still slow return of bowel function.  Electrolytes and hemoglobin stable.  Patient has not gotten out of bed yet, and I am getting a physical therapy consultation in order to get the patient ambulating.  I talked to her about a skilled nursing unit for rehab and she is amenable to this.  Final pathology is still pending.  Will advance to full liquid diet.  [name] Signs 07/11/2021

## 2021-07-12 ENCOUNTER — Inpatient Hospital Stay: Payer: Self-pay

## 2021-07-12 ENCOUNTER — Inpatient Hospital Stay (HOSPITAL_COMMUNITY): Payer: Medicare HMO

## 2021-07-12 DIAGNOSIS — I4891 Unspecified atrial fibrillation: Secondary | ICD-10-CM | POA: Diagnosis not present

## 2021-07-12 DIAGNOSIS — I5042 Chronic combined systolic (congestive) and diastolic (congestive) heart failure: Secondary | ICD-10-CM | POA: Diagnosis not present

## 2021-07-12 DIAGNOSIS — I890 Lymphedema, not elsewhere classified: Secondary | ICD-10-CM | POA: Diagnosis not present

## 2021-07-12 DIAGNOSIS — C189 Malignant neoplasm of colon, unspecified: Secondary | ICD-10-CM | POA: Diagnosis not present

## 2021-07-12 MED ORDER — SODIUM CHLORIDE 0.9% FLUSH: 10.0000 mL | INTRAVENOUS | Status: DC | PRN

## 2021-07-12 MED ORDER — BISACODYL 10 MG RE SUPP
10.0000 mg | Freq: Two times a day (BID) | RECTAL | Status: DC
  Administered 2021-07-12: 10 mg via RECTAL
  Filled 2021-07-12 (×3): qty 1

## 2021-07-12 MED ORDER — METOCLOPRAMIDE HCL 5 MG/ML IJ SOLN
10.0000 mg | Freq: Four times a day (QID) | INTRAMUSCULAR | Status: AC
  Administered 2021-07-12 – 2021-07-13 (×6): 10 mg via INTRAVENOUS
  Filled 2021-07-12 (×7): qty 2

## 2021-07-12 MED ORDER — SODIUM CHLORIDE 0.9% FLUSH
10.0000 mL | Freq: Two times a day (BID) | INTRAVENOUS | Status: DC
  Administered 2021-07-13 – 2021-07-17 (×9): 10 mL

## 2021-07-12 MED ORDER — CHLORHEXIDINE GLUCONATE CLOTH 2 % EX PADS
6.0000 | MEDICATED_PAD | Freq: Every day | CUTANEOUS | Status: DC
  Administered 2021-07-13 – 2021-07-17 (×5): 6 via TOPICAL

## 2021-07-12 MED ORDER — PANTOPRAZOLE SODIUM 40 MG PO TBEC
40.0000 mg | DELAYED_RELEASE_TABLET | Freq: Every day | ORAL | Status: DC
  Administered 2021-07-12 – 2021-07-17 (×6): 40 mg via ORAL
  Filled 2021-07-12 (×6): qty 1

## 2021-07-12 NOTE — Progress Notes (Signed)
PROGRESS NOTE  Lauren [name] [id number] DOB: 1946/03/02 DOA: 07/03/2021 PCP: Pcp, No  Brief History:  75 year old female with a history of atrial fibrillation, morbid obesity, diastolic CHF, hypertension, lymphedema presenting with constipation and rectal pain of 2 day duration.  The patient was recently admitted to [facility] from 06/28/2021 to 06/30/2021 for constipation.  During that admission, the patient was noted to have a hemoglobin of 5.6.  She was transfused with 2 units PRBC.  Her discharge hemoglobin was 8.  She had a CT of the abdomen and pelvis which showed cecal thickening raising questions of a possible cecal mass with small adjacent lymph nodes.  There is also skin thickening over her pannus.  The patient was given cathartics and ultimately had a bowel movement.  The patient was scheduled to have colonoscopy performed by general surgery, but she left AGAINST MEDICAL ADVICE prior to this being done.  An echocardiogram was performed on 06/28/2021 which showed EF 30%, mild to moderate MR, and normal RV. The patient states that she has not been fully compliant with all her prescription medications.  She has not taken her prescription medicines including her torsemide, Xarelto, and diltiazem since her discharge from [facility].  Even prior to that, patient states that she has not been fully compliant with her medications.  She states that she does not use any cathartics at home.  She denies any hematochezia, melena, hematemesis. In the emergency department, patient was afebrile and hemodynamically stable.  She was noted to have atrial fibrillation with RVR.  Oxygen saturation was 96% on room air.  BMP was unremarkable.  WBC 8.3, hemoglobin 7.8, platelets 265,000.  LFTs were unremarkable.   Assessment/Plan: Atrial fibrillation with RVR -Initially on metoprolol succinate 25 mg bid -digoxin added by cardiology -06/29/21 Echo EF 30%, mild-mod MR -Soft blood pressures limit titration of her  chronotropic medications intially -Personally reviewed EKG--atrial fibrillation, nonspecific T wave change -cardiology consult appreciated -8/14 started IV lopressor as pt not tolerating po>>>transitioning to po metoprolol succinate by cardiology -8/16--digoxin x 1 -Anticoagulated with Xarelto -Heart rate is currently stable   Abnormal CT abdomen/cecal mass -GI consult appreciated--discussed with Dr. Abbey Chatters -06/28/2021 CT abdomen as discussed above -07/05/21 colonoscopy--partially obstructing cecal mass -general surgery consult appreciated -R-hemicolectomy 07/06/21--Dr. Arnoldo Morale -discussed with Dr. 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 Arnoldo Morale initially -minimize opioids as she became very somnolent with combo on 07/03/21 -d/c ativan as pt is somnolent with morphine and ativan   Iron deficiency anemia -06/29/2021 iron saturation 4, ferritin 5 -dose nulecit x 2   Morbid obesity -BMI 46.33 -Lifestyle modification   Hypothyroidism -Continue Synthroid   Lymphedema -Currently on IV Lasix, transition back to torsemide once able to tolerate p.o.   Chronic systolic and Diastolic CHF -clinically euvolemic -restart home torsemide when tolerating po -lasix 20 mg IV daily for now         Status is: Inpatient   The patient will require care spanning > 2 midnights and should be moved to inpatient because: Inpatient level of care appropriate due to severity of illness   Dispo: The patient is from: Home  Anticipated d/c is to: Home              Patient currently is  not medically stable to d/c.              Difficult to place patient No               Family Communication:  no Family at bedside   Consultants:  GI, general surgery   Code Status:  FULL    DVT Prophylaxis: Xarelto restarted 8/14     Procedures: As Listed in Progress Note Above   Antibiotics: None     Subjective: She is not had any significant bowel movements.  She continues to have some vomiting earlier today.  Objective: Vitals:   07/12/21 0346 07/12/21 0852 07/12/21 1448 07/12/21 1726  BP: 124/64 117/60 110/66 (!) 114/56  Pulse: 90 76 73 74  Resp: 20  18   Temp: 98.2 F (36.8 C)  (!) 97.5 F (36.4 C)   TempSrc:   Oral   SpO2: 97%  94%   Weight:      Height:        Intake/Output Summary (Last 24 hours) at 07/12/2021 1924 Last data filed at 07/11/2021 2300 Gross per 24 hour  Intake --  Output 200 ml  Net -200 ml   Weight change:  Exam:  General exam: Alert, awake, oriented x 3 Respiratory system: Clear to auscultation. Respiratory effort normal. Cardiovascular system:RRR. No murmurs, rubs, gallops. Gastrointestinal system: Abdomen is distended, soft and nontender. No organomegaly or masses felt.  Hypoactive bowel sounds Central nervous system: Alert and oriented. No focal neurological deficits. Extremities: Nonpitting edema bilaterally Skin: No rashes, lesions or ulcers Psychiatry: Judgement and insight appear normal. Mood & affect appropriate.     Data Reviewed: I have personally reviewed following labs and imaging studies Basic Metabolic Panel: Recent Labs  Lab 07/07/21 0341 07/08/21 0314 07/09/21 0343 07/10/21 0453 07/11/21 0454  NA 135 137 135 137 134*  K 4.2 3.8 3.7 3.4* 3.5  CL 106 102 101 101 103  CO2 '26 27 23 28 25  '$ GLUCOSE 133* 77 102* 80 83  BUN '9 10 9 '$ 7* 9  CREATININE 0.85 0.85 0.76 0.69 0.82  CALCIUM 8.4* 8.2* 8.0* 8.2* 7.7*  MG 1.7 2.0 1.9 1.8  --   PHOS 3.3 2.8 2.9 2.8  --    Liver Function Tests: No results for  input(s): AST, ALT, ALKPHOS, BILITOT, PROT, ALBUMIN in the last 168 hours.  No results for input(s): LIPASE, AMYLASE in the last 168 hours. No results for input(s): AMMONIA in the last 168 hours. Coagulation Profile: No results for input(s): INR, PROTIME in the last 168 hours. CBC: Recent Labs  Lab 07/07/21 0341 07/08/21 0314 07/09/21 0343 07/10/21 0453 07/11/21 0454  WBC 10.3 10.1 11.5* 9.0 9.2  HGB 7.8* 7.8* 8.9* 8.4* 8.7*  HCT 29.0* 28.8* 33.3* 30.9* 31.4*  MCV 76.1* 75.2* 75.0* 74.5* 74.9*  PLT 231 255 272 235 261   Cardiac Enzymes: No results for input(s): CKTOTAL, CKMB, CKMBINDEX, TROPONINI in the last 168 hours. BNP: Invalid input(s): POCBNP CBG: No results for input(s): GLUCAP in the last 168 hours.  HbA1C: No results for input(s): HGBA1C in the last 72 hours. Urine analysis:    Component Value Date/Time   COLORURINE YELLOW 03/26/2014 1346   APPEARANCEUR CLEAR 03/26/2014 1346   LABSPEC 1.011 03/26/2014 1346   PHURINE 5.0 03/26/2014 1346   GLUCOSEU NEGATIVE 03/26/2014 1346   HGBUR MODERATE (A) 03/26/2014  [place] 03/26/2014 1346   KETONESUR NEGATIVE 03/26/2014 1346   PROTEINUR NEGATIVE 03/26/2014 1346   UROBILINOGEN 0.2 03/26/2014 1346   NITRITE NEGATIVE 03/26/2014 1346   LEUKOCYTESUR NEGATIVE 03/26/2014 1346   Sepsis Labs: '@LABRCNTIP'$ (procalcitonin:4,lacticidven:4) ) Recent Results (from the past 240 hour(s))  SARS CORONAVIRUS 2 (TAT 6-24 HRS) Nasopharyngeal Nasopharyngeal Swab     Status: None   Collection Time: 07/03/21  7:45 PM   Specimen: Nasopharyngeal Swab  Result Value Ref Range Status   SARS Coronavirus 2 NEGATIVE NEGATIVE Final    Comment: (NOTE) SARS-CoV-2 target nucleic acids are NOT DETECTED.  The SARS-CoV-2 RNA is generally detectable in upper and lower respiratory specimens during the acute phase of infection. Negative results do not preclude SARS-CoV-2 infection, do not rule out co-infections with other pathogens,  and should not be used as the sole basis for treatment or other patient management decisions. Negative results must be combined with clinical observations, patient history, and epidemiological information. The expected result is Negative.  Fact Sheet for Patients: SugarRoll.be  Fact Sheet for Healthcare Providers: https://www.woods-mathews.com/  This test is not yet approved or cleared by the [place] FDA and  has been authorized for detection and/or diagnosis of SARS-CoV-2 by FDA under an Emergency Use Authorization (EUA). This EUA will remain  in effect (meaning this test can be used) for the duration of the COVID-19 declaration under Se ction 564(b)(1) of the Act, 21 U.S.C. section 360bbb-3(b)(1), unless the authorization is terminated or revoked sooner.  Performed at [facility], [place] [address] [place], [place] [address]   Surgical PCR screen     Status: None   Collection Time: 07/05/21  3:20 PM   Specimen: Nasal Mucosa; Nasal Swab  Result Value Ref Range Status   MRSA, PCR NEGATIVE NEGATIVE Final   Staphylococcus aureus NEGATIVE NEGATIVE Final    Comment: (NOTE) The Xpert SA Assay (FDA approved for NASAL specimens in patients 38 years of age and older), is one component of a comprehensive surveillance program. It is not intended to diagnose infection nor to guide or monitor treatment. Performed at [facility], [address] [place], [place] [address]      Scheduled Meds:  bisacodyl  10 mg Rectal BID   Chlorhexidine Gluconate Cloth  6 each Topical Daily   furosemide  20 mg Intravenous Daily   levothyroxine  50 mcg Intravenous Daily   metoCLOPramide (REGLAN) injection  10 mg Intravenous Q6H   metoprolol succinate  25 mg Oral BID   pantoprazole  40 mg Oral Q1200   polyethylene glycol  17 g Oral BID   rivaroxaban  20 mg Oral Q supper   sodium chloride flush  10-40 mL Intracatheter Q12H   Continuous Infusions:   lactated ringers      Procedures/Studies: DG Abd 1 View  Result Date: 07/12/2021 CLINICAL DATA:  Constant EXAM: ABDOMEN - 1 VIEW COMPARISON:  Portable exam 1355 hours compared to 07/03/2021 FINDINGS: Gaseous distension of multiple small bowel loops throughout abdomen. Paucity of colonic gas though small amount gas is seen within the distal sigmoid colon and rectum. Surgical clips identified at the mid abdomen. Findings likely represent postoperative ileus. No bowel wall thickening or urinary tract calcification. Degenerative changes and levoconvex scoliosis lumbar spine. IMPRESSION: Gaseous distention of multiple small bowel loops throughout abdomen favoring postoperative ileus. Electronically Signed   By: Lavonia Dana M.D.   On: 07/12/2021 14:28   DG Abdomen 1 View  Result Date: 07/03/2021 CLINICAL  DATA:  Constipation EXAM: ABDOMEN - 1 VIEW COMPARISON:  CT pelvis June 28, 2021 FINDINGS: The bowel gas pattern is normal. Moderate volume of formed stool throughout the colon. Degenerative changes bilateral hips. Lumbar spondylosis. IMPRESSION: Nonobstructive bowel gas pattern. Moderate volume of formed stool throughout the colon. Electronically Signed   By: Dahlia Bailiff MD   On: 07/03/2021 17:45   DG CHEST PORT 1 VIEW  Result Date: 07/12/2021 CLINICAL DATA:  PICC line verification EXAM: PORTABLE CHEST 1 VIEW COMPARISON:  Portable exam 1352 hours compared to 06/28/2021 FINDINGS: RIGHT arm PICC line tip projects over SVC. Minimal enlargement of cardiac silhouette. Mediastinal contours and pulmonary vascularity normal. Subsegmental atelectasis LEFT base. Lungs otherwise clear. Tiny LEFT pleural effusion. No pneumothorax. IMPRESSION: LEFT basilar atelectasis and tiny pleural effusion. RIGHT arm PICC line tip projects over SVC. Electronically Signed   By: Lavonia Dana M.D.   On: 07/12/2021 14:27   [place] EKG SITE RITE  Result Date: 07/12/2021 If Site Rite image not attached, placement could not be confirmed  due to current cardiac rhythm.   Kathie Dike, MD  [facility]  If 7PM-7AM, please contact night-coverage [email or web address]  07/12/2021, 7:24 PM   LOS: 6 days

## 2021-07-12 NOTE — Progress Notes (Signed)
Peripherally Inserted Central Catheter Placement  The IV Nurse has discussed with the patient and/or persons authorized to consent for the patient, the purpose of this procedure and the potential benefits and risks involved with this procedure.  The benefits include less needle sticks, lab draws from the catheter, and the patient may be discharged home with the catheter. Risks include, but not limited to, infection, bleeding, blood clot (thrombus formation), and puncture of an artery; nerve damage and irregular heartbeat and possibility to perform a PICC exchange if needed/ordered by physician.  Alternatives to this procedure were also discussed.  Bard Power PICC patient education guide, fact sheet on infection prevention and patient information card has been provided to patient /or left at bedside.    PICC Placement Documentation  PICC Double Lumen 123456 PICC Right Basilic 40 cm 0 cm (Active)  Indication for Insertion or Continuance of Line Prolonged intravenous therapies 07/12/21 1304  Exposed Catheter (cm) 0 cm 07/12/21 1304  Site Assessment Clean;Dry;Intact 07/12/21 1304  Lumen #1 Status Flushed;Saline locked;Blood return noted 07/12/21 1304  Lumen #2 Status Flushed;Saline locked;Blood return noted 07/12/21 1304  Dressing Type Transparent;Securing device 07/12/21 1304  Antimicrobial disc in place? Yes 07/12/21 1304  Safety Lock Not Applicable [id number]  Dressing Change Due 07/19/21 07/12/21 1304       Frances Maywood 07/12/2021, 1:09 PM

## 2021-07-12 NOTE — NC FL2 (Signed)
[place] LEVEL OF CARE SCREENING TOOL     IDENTIFICATION  Patient Name: Lauren [name] Birthdate: 1946/06/13 Sex: female Admission Date (Current Location): 07/03/2021  [facility] and [place] Number:  [employer] and Address:  [place] [address] [place]      Provider Number: [id number]  Attending Physician Name and Address:  Kathie Dike, MD  Relative Name and Phone Number:  Kenzie Eitzen - spouse [phone]    Current Level of Care: Hospital Recommended Level of Care: [place] Prior Approval Number:    Date Approved/Denied:   PASRR Number: [id number]  Discharge Plan: SNF    Current Diagnoses: Patient Active Problem List   Diagnosis Date Noted   Colon carcinoma [facility])    Morbid obesity with BMI of 50.0-59.9, adult ([place]) 07/04/2021   Chronic combined systolic and diastolic CHF (congestive heart failure) ([place]) 07/04/2021   Constipation    Colonic mass    Iron deficiency anemia 06/29/2021   Elevated troponin 06/29/2021   Potassium (K) deficiency 06/29/2021   Rectal pain 06/29/2021   Chronic acquired lymphedema 02/17/2018   Essential hypertension with goal blood pressure less than 130/80 02/17/2018   Anxiety state 03/24/2014   Gallstone pancreatitis 03/23/2014   Acute cholecystitis 12/06/2012   Atrial fibrillation with RVR ([place]) 12/06/2012   Pancreatitis, acute 12/04/2012   Hypothyroidism 12/04/2012   Obesity (BMI 35.0-39.9 without comorbidity) 12/04/2012    Orientation RESPIRATION BLADDER Height & Weight     Self, Time, Situation, Place  Normal External catheter Weight: 114.9 kg Height:  '5\' 2"'$  (157.5 cm)  BEHAVIORAL SYMPTOMS/MOOD NEUROLOGICAL BOWEL NUTRITION STATUS      Continent Diet (See DC summary)  AMBULATORY STATUS COMMUNICATION OF NEEDS Skin   Extensive Assist Verbally Normal                       Personal Care Assistance Level of Assistance  Bathing, Feeding, Dressing  Bathing Assistance: Limited assistance Feeding assistance: Independent Dressing Assistance: Limited assistance     Functional Limitations Info  Sight, Hearing, Speech Sight Info: Impaired Hearing Info: Adequate Speech Info: Adequate    SPECIAL CARE FACTORS FREQUENCY  PT (By licensed PT)     PT Frequency: 5 times a week              Contractures Contractures Info: Not present    Additional Factors Info  Code Status, Allergies Code Status Info: FULL Allergies Info: codeine, compazine, prednisone, tetanus, vicodin           Current Medications (07/12/2021):  This is the current hospital active medication list Current Facility-Administered Medications  Medication Dose Route Frequency Provider Last Rate Last Admin   acetaminophen (TYLENOL) tablet 650 mg  650 mg Oral Q6H PRN Tat, Shanon Brow, MD       Or   acetaminophen (TYLENOL) suppository 650 mg  650 mg Rectal Q6H PRN Tat, Shanon Brow, MD       alum & mag hydroxide-simeth (MAALOX/MYLANTA) 200-200-20 MG/5ML suspension 30 mL  30 mL Oral Q6H PRN [name] Signs, MD       bisacodyl (DULCOLAX) suppository 10 mg  10 mg Rectal BID [name] Signs, MD       furosemide (LASIX) injection 20 mg  20 mg Intravenous Daily Tat, Shanon Brow, MD   20 mg at 07/11/21 [id number]   lactated ringers infusion   Intravenous Continuous Nicanor Alcon, MD       levothyroxine (SYNTHROID, LEVOTHROID) injection 50  mcg  50 mcg Intravenous Daily Tat, David, MD   50 mcg at 07/11/21 0518   magnesium hydroxide (MILK OF MAGNESIA) suspension 30 mL  30 mL Oral TID PRN Tat, Shanon Brow, MD       melatonin tablet 3 mg  3 mg Oral QHS PRN Opyd, Ilene Qua, MD       metoCLOPramide (REGLAN) injection 10 mg  10 mg Intravenous Q6H [name] Signs, MD       metoprolol succinate (TOPROL-XL) 24 hr tablet 25 mg  25 mg Oral BID Abel Presto, MD   25 mg at 07/12/21 [id number]   metoprolol tartrate (LOPRESSOR) injection 5 mg  5 mg Intravenous Q5 min PRN Tat, Shanon Brow, MD       ondansetron (ZOFRAN-ODT) disintegrating  tablet 4 mg  4 mg Oral Q6H PRN Tat, Shanon Brow, MD   4 mg at 07/12/21 0417   Or   ondansetron (ZOFRAN) injection 4 mg  4 mg Intravenous Q6H PRN Tat, Shanon Brow, MD   4 mg at 07/11/21 1654   pantoprazole (PROTONIX) EC tablet 40 mg  40 mg Oral Q1200 [name] Signs, MD       polyethylene glycol (MIRALAX / GLYCOLAX) packet 17 g  17 g Oral BID Orson Eva, MD   17 g at 07/11/21 [id number]   rivaroxaban (XARELTO) tablet 20 mg  20 mg Oral Q supper Tat, Shanon Brow, MD   20 mg at 07/11/21 1732   simethicone (MYLICON) chewable tablet 40 mg  40 mg Oral Q6H PRN Orson Eva, MD   40 mg at 07/11/21 1732   traMADol (ULTRAM) tablet 50 mg  50 mg Oral Q6H PRN Orson Eva, MD   50 mg at 07/12/21 [id number]     Discharge Medications: Please see discharge summary for a list of discharge medications.  Relevant Imaging Results:  Relevant Lab Results:   Additional Information SS# [id number]  Boneta Lucks, RN

## 2021-07-12 NOTE — Progress Notes (Signed)
6 Days Post-Op  Subjective: Patient threw up again this morning.  She denies any abdominal pain.  She has lost IV access.  Objective: Vital signs in last 24 hours: Temp:  [98.2 F (36.8 C)-98.7 F (37.1 C)] 98.2 F (36.8 C) (08/18 0346) Pulse Rate:  [86-111] 90 (08/18 0346) Resp:  [16-20] 20 (08/18 0346) BP: (107-136)/(54-84) 124/64 (08/18 0346) SpO2:  [94 %-98 %] 97 % (08/18 0346) Last BM Date: 07/08/21  Intake/Output from previous day: 08/17 0701 - 08/18 0700 In: 596 [P.O.:596] Out: 900 [Urine:200; Emesis/NG output:700] Intake/Output this shift: No intake/output data recorded.  General appearance: alert, cooperative, and no distress GI: Soft, incision healing well.  Minimal bowel sounds appreciated.  Nontender.  Lab Results:  Recent Labs    07/10/21 0453 07/11/21 0454  WBC 9.0 9.2  HGB 8.4* 8.7*  HCT 30.9* 31.4*  PLT 235 261   BMET Recent Labs    07/10/21 0453 07/11/21 0454  NA 137 134*  K 3.4* 3.5  CL 101 103  CO2 28 25  GLUCOSE 80 83  BUN 7* 9  CREATININE 0.69 0.82  CALCIUM 8.2* 7.7*   PT/INR No results for input(s): LABPROT, INR in the last 72 hours.  Studies/Results: [place] EKG SITE RITE  Result Date: 07/12/2021 If Site Rite image not attached, placement could not be confirmed due to current cardiac rhythm.   Anti-infectives: Anti-infectives (From admission, onward)    Start     Dose/Rate Route Frequency Ordered Stop   07/06/21 1200  ertapenem (INVANZ) 1,000 mg in sodium chloride 0.9 % 100 mL IVPB        1 g 200 mL/hr over 30 Minutes Intravenous On call to O.R. 07/05/21 1453 07/06/21 1113   07/06/21 0941  sodium chloride 0.9 % with ertapenem [facility]) ADS Med       Note to Pharmacy: Augustin Schooling   : cabinet override      07/06/21 0941 07/06/21 1058   07/05/21 1600  neomycin (MYCIFRADIN) tablet 1,000 mg  Status:  Discontinued        1,000 mg Oral Every 4 hours 07/05/21 1438 07/06/21 0359   07/05/21 1530  metroNIDAZOLE (FLAGYL) IVPB 500 mg         500 mg 100 mL/hr over 60 Minutes Intravenous Every 4 hours 07/05/21 1438 07/06/21 0126       Assessment/Plan: s/p Procedure(s): RIGHT HEMICOLECTOMY Impression: Postoperative day 6.  Patient's bowel function has not returned yet.  She has not tolerated full liquid diet.  Appears to have an ileus.  Suspect ileus is multifactorial in nature.  No evidence of infection or hematoma.  We will get KUB.  We will also get PICC line due to loss of IV access.  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  1 out of 21 lymph nodes was positive.  Patient aware.  Restart Reglan once IV is in place.  LOS: 6 days    [name] Signs 07/12/2021

## 2021-07-12 NOTE — Progress Notes (Signed)
After PICC placement, patient c/o tenderness below insertion site. Skin warm to the touch and no swelling noted at this time. May apply warm compress or ice for comfort.

## 2021-07-12 NOTE — TOC Initial Note (Addendum)
Transition of Care [facility]) - Initial/Assessment Note    Patient Details  Name: Lauren [name] MRN: [id number] Date of Birth: 04-02-1946  Transition of Care [facility]) CM/SW Contact:    Boneta Lucks, RN Phone Number: 07/12/2021, 10:57 AM  Clinical Narrative:      Patient admitted with atrial fibrillation, New colon mass diagnosis. Patient has surgery, PT is recommending SNF. Patient is agreeable to send out referral, however she wants see how she does and rather go home with home health if possible. Her first choice for SNF is [facility]. FL2 sent out. Patient has not had any of the COVID vaccines. TOC to follow.              Expected Discharge Plan: Skilled Nursing Facility Barriers to Discharge: Continued Medical Work up  Patient Goals and CMS Choice Patient states their goals for this hospitalization and ongoing recovery are:: to get better CMS Medicare.gov Compare Post Acute Care list provided to:: Patient Choice offered to / list presented to : Patient  Expected Discharge Plan and Services Expected Discharge Plan: [place]            Prior Living Arrangements/Services   Lives with:: Spouse Patient language and need for interpreter reviewed:: Yes        Need for Family Participation in Patient Care: Yes (Comment) Care giver support system in place?: Yes (comment)   Criminal Activity/Legal Involvement Pertinent to Current Situation/Hospitalization: No - Comment as needed  Activities of Daily Living Home Assistive Devices/Equipment: Eyeglasses ADL Screening (condition at time of admission) Patient's cognitive ability adequate to safely complete daily activities?: Yes Is the patient deaf or have difficulty hearing?: No Does the patient have difficulty seeing, even when wearing glasses/contacts?: Yes (patient states needs prescription changed) Does the patient have difficulty concentrating, remembering, or making decisions?: No Patient able to express need for  assistance with ADLs?: Yes Does the patient have difficulty dressing or bathing?: No (patient states gets SOB due to afib at times, otherwise no problems) Independently performs ADLs?: Yes (appropriate for developmental age) Does the patient have difficulty walking or climbing stairs?: Yes Weakness of Legs: Both Weakness of Arms/Hands: None  Permission Sought/Granted      Emotional Assessment     Affect (typically observed): Accepting Orientation: : Oriented to Self, Oriented to Place, Oriented to  Time, Oriented to Situation Alcohol / Substance Use: Not Applicable Psych Involvement: No (comment)  Admission diagnosis:  Atrial fibrillation with rapid ventricular response (HCC) [I48.91] Constipation, unspecified constipation type [K59.00] Colonic mass [K63.89] Patient Active Problem List   Diagnosis Date Noted   Colon carcinoma ([place])    Morbid obesity with BMI of 50.0-59.9, adult ([place]) 07/04/2021   Chronic combined systolic and diastolic CHF (congestive heart failure) ([place]) 07/04/2021   Constipation    Colonic mass    Iron deficiency anemia 06/29/2021   Elevated troponin 06/29/2021   Potassium (K) deficiency 06/29/2021   Rectal pain 06/29/2021   Chronic acquired lymphedema 02/17/2018   Essential hypertension with goal blood pressure less than 130/80 02/17/2018   Anxiety state 03/24/2014   Gallstone pancreatitis 03/23/2014   Acute cholecystitis 12/06/2012   Atrial fibrillation with RVR ([place]) 12/06/2012   Pancreatitis, acute 12/04/2012   Hypothyroidism 12/04/2012   Obesity (BMI 35.0-39.9 without comorbidity) 12/04/2012   PCP:  [name], No Pharmacy:   [facility] - [place], [place] [address] Phone: [phone] Fax: [phone]  [place], [place] [address] Phone: [phone] Fax:  [phone]   Readmission Risk Interventions Readmission Risk Prevention Plan 07/12/2021  Transportation Screening Complete  Home Care Screening Complete  Medication Review (RN CM) Complete  Some recent data might be hidden

## 2021-07-13 LAB — CBC
HCT: 32.3 % — ABNORMAL LOW (ref 36.0–46.0)
Hemoglobin: 9.2 g/dL — ABNORMAL LOW (ref 12.0–15.0)
MCH: 21.5 pg — ABNORMAL LOW (ref 26.0–34.0)
MCHC: 28.5 g/dL — ABNORMAL LOW (ref 30.0–36.0)
MCV: 75.5 fL — ABNORMAL LOW (ref 80.0–100.0)
Platelets: 277 10*3/uL (ref 150–400)
RBC: 4.28 MIL/uL (ref 3.87–5.11)
RDW: 26.1 % — ABNORMAL HIGH (ref 11.5–15.5)
WBC: 15 10*3/uL — ABNORMAL HIGH (ref 4.0–10.5)
nRBC: 0 % (ref 0.0–0.2)

## 2021-07-13 LAB — RENAL FUNCTION PANEL
Albumin: 2.3 g/dL — ABNORMAL LOW (ref 3.5–5.0)
Anion gap: 7 (ref 5–15)
BUN: 10 mg/dL (ref 8–23)
CO2: 27 mmol/L (ref 22–32)
Calcium: 8.2 mg/dL — ABNORMAL LOW (ref 8.9–10.3)
Chloride: 99 mmol/L (ref 98–111)
Creatinine, Ser: 0.94 mg/dL (ref 0.44–1.00)
GFR, Estimated: >60 mL/min (ref >60)
Glucose, Bld: 101 mg/dL — ABNORMAL HIGH (ref 70–99)
Phosphorus: 2.8 mg/dL (ref 2.5–4.6)
Potassium: 3.1 mmol/L — ABNORMAL LOW (ref 3.5–5.1)
Sodium: 133 mmol/L — ABNORMAL LOW (ref 135–145)

## 2021-07-13 LAB — MAGNESIUM: Magnesium: 1.6 mg/dL — ABNORMAL LOW (ref 1.7–2.4)

## 2021-07-13 MED ORDER — MAGNESIUM SULFATE 2 GM/50ML IV SOLN
2.0000 g | Freq: Once | INTRAVENOUS | Status: AC
  Administered 2021-07-13: 2 g via INTRAVENOUS
  Filled 2021-07-13: qty 50

## 2021-07-13 MED ORDER — POTASSIUM CHLORIDE 10 MEQ/100ML IV SOLN
10.0000 meq | INTRAVENOUS | Status: AC
Start: 2021-07-13 — End: 2021-07-13
  Administered 2021-07-13 (×6): 10 meq via INTRAVENOUS
  Filled 2021-07-13 (×6): qty 100

## 2021-07-13 MED ORDER — ENSURE MAX PROTEIN PO LIQD
11.0000 [oz_av] | Freq: Two times a day (BID) | ORAL | Status: DC
  Administered 2021-07-14 – 2021-07-17 (×4): 11 [oz_av] via ORAL

## 2021-07-13 MED ORDER — ADULT MULTIVITAMIN W/MINERALS CH
1.0000 | ORAL_TABLET | Freq: Every day | ORAL | Status: DC
  Administered 2021-07-13 – 2021-07-17 (×5): 1 via ORAL
  Filled 2021-07-13 (×5): qty 1

## 2021-07-13 NOTE — Progress Notes (Signed)
Physical Therapy Treatment Patient Details Name: Lauren [name] MRN: [id number] DOB: 08/29/1946 Today's Date: 07/13/2021    History of Present Illness 75 yo female with c/o constipation and rectal pain. 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 Pt s/p R hemicolectomy 07/06/21. PMH: a fib, morbid obesity, diastolic CHF, hypertension, lymphedema    PT Comments    Patient apprehensive for mobility as she states she just got comfortable in bed. Patient agreeable to perform supine exercises. Frequent encouragement provided for further mobility with poor carry over. Patient will benefit from continued physical therapy in hospital and recommended venue below to increase strength, balance, endurance for safe ADLs and gait.    Follow Up Recommendations  SNF     Equipment Recommendations  None recommended by PT    Recommendations for Other Services       Precautions / Restrictions Precautions Precautions: Fall Restrictions Weight Bearing Restrictions: No    Mobility  Bed Mobility                    Transfers                    Ambulation/Gait                 Stairs             Wheelchair Mobility    Modified Rankin (Stroke Patients Only)       Balance                                            Cognition Arousal/Alertness: Awake/alert Behavior During Therapy: WFL for tasks assessed/performed Overall Cognitive Status: Within Functional Limits for tasks assessed                                        Exercises General Exercises - Lower Extremity Ankle Circles/Pumps: AROM;Both;15 reps;Supine    General Comments        Pertinent Vitals/Pain Pain Assessment: Faces Faces Pain Scale: No hurt    Home Living                      Prior Function            PT Goals (current goals can now be found in the care plan section) Acute Rehab PT Goals Patient  Stated Goal: "get a roommate to assist pt at home" PT Goal Formulation: With patient Time For Goal Achievement: 07/25/21 Potential to Achieve Goals: Good Progress towards PT goals: Progressing toward goals    Frequency    Min 3X/week      PT Plan      Co-evaluation              AM-PAC PT "6 Clicks" Mobility   Outcome Measure  Help needed turning from your back to your side while in a flat bed without using bedrails?: A Lot Help needed moving from lying on your back to sitting on the side of a flat bed without using bedrails?: A Lot Help needed moving to and from a bed to a chair (including a wheelchair)?: A Lot Help needed standing up from a chair using your arms (e.g., wheelchair or bedside chair)?: A Lot Help needed to walk in hospital  room?: A Lot Help needed climbing 3-5 steps with a railing? : A Lot 6 Click Score: 12    End of Session   Activity Tolerance: Patient limited by fatigue Patient left: in bed;with call bell/phone within reach;with bed alarm set Nurse Communication: Mobility status PT Visit Diagnosis: Other abnormalities of gait and mobility (R26.89);Muscle weakness (generalized) (M62.81)     Time: [id number] PT Time Calculation (min) (ACUTE ONLY): 12 min  Charges:  $Therapeutic Exercise: 8-22 mins                     10:41 AM, 07/13/21 Mearl Latin PT, DPT Physical Therapist at [facility]

## 2021-07-13 NOTE — Progress Notes (Addendum)
Initial Nutrition Assessment  DOCUMENTATION CODES:   Morbid obesity  INTERVENTION:  Ensure Max BID (provides 150 kcal, 30 gr protein)  Multivitamin daily   NUTRITION DIAGNOSIS:   Inadequate oral intake related to nausea, vomiting, poor tolerance of diet advancement following surgery as evidenced by prolonged need for liquid diet,  energy intake < or equal to 50% for > or equal to 5 days. LOS day 10.    GOAL:  Patient will meet greater than or equal to 90% of their needs   MONITOR:  Diet advancement, Supplement acceptance, PO intake, Weight trends, Skin, I & O's, Labs  REASON FOR ASSESSMENT:   LOS    ASSESSMENT: Patient is a 75 yo female with history of obesity, diverticulosis and presents with complaint of constipation. CT findings- rectal mass.  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  Patient has experienced intermittent episodes of vomiting and has slowly been able to advance diet. Today she will receive Soft diet for dinner. Patient is excited to have regular foods.   Patient weight 114.9 kg currently. Likely has acute wt loss due to minimal nutrition intake the past 10 days. She complains of feeling tired today. PT reccommended SNF.  Medications reviewed and include: Reglan, Protonix, Lasix, Dulcolax.   Labs: BMP Latest Ref Rng & Units 07/13/2021 07/11/2021 07/10/2021  Glucose 70 - 99 mg/dL 101(H) 83 80  BUN 8 - 23 mg/dL 10 9 7(L)  Creatinine 0.44 - 1.00 mg/dL 0.94 0.82 0.69  Sodium 135 - 145 mmol/L 133(L) 134(L) 137  Potassium 3.5 - 5.1 mmol/L 3.1(L) 3.5 3.4(L)  Chloride 98 - 111 mmol/L 99 103 101  CO2 22 - 32 mmol/L '27 25 28  '$ Calcium 8.9 - 10.3 mg/dL 8.2(L) 7.7(L) 8.2(L)      NUTRITION - FOCUSED PHYSICAL EXAM: Nutrition-Focused physical exam completed. Findings are no fat depletion, mild temporal muscle depletion, and moderate pitting bilateral lower extremity edema.     Diet Order:   Diet Order             DIET SOFT Room  service appropriate? Yes; Fluid consistency: Thin  Diet effective now                   EDUCATION NEEDS:  Education needs have been addressed  Skin:  Skin Assessment: Skin Integrity Issues: Skin Integrity Issues:: Incisions Incisions: closed abdomen incision  Last BM:  8/19 type 7   Height:   Ht Readings from Last 1 Encounters:  07/03/21 '5\' 2"'$  (1.575 m)    Weight:   Wt Readings from Last 1 Encounters:  07/10/21 114.9 kg    Ideal Body Weight:   50 kg  BMI:  Body mass index is 46.33 kg/m.  Estimated Nutritional Needs:   Kcal:  1700-1900  Protein:  88-97 gr  Fluid:  1800-1900 ml daily  Colman Cater MS,RD,CSG,LDN Contact: Shea Evans

## 2021-07-13 NOTE — Progress Notes (Signed)
PROGRESS NOTE  Lauren [name] [id number] DOB: 1946/05/01 DOA: 07/03/2021 PCP: Pcp, No  Brief History:  75 year old female with a history of atrial fibrillation, morbid obesity, diastolic CHF, hypertension, lymphedema presenting with constipation and rectal pain of 2 day duration.  The patient was recently admitted to [facility] from 06/28/2021 to 06/30/2021 for constipation.  During that admission, the patient was noted to have a hemoglobin of 5.6.  She was transfused with 2 units PRBC.  Her discharge hemoglobin was 8.  She had a CT of the abdomen and pelvis which showed cecal thickening raising questions of a possible cecal mass with small adjacent lymph nodes.  There is also skin thickening over her pannus.  The patient was given cathartics and ultimately had a bowel movement.  The patient was scheduled to have colonoscopy performed by general surgery, but she left AGAINST MEDICAL ADVICE prior to this being done.  An echocardiogram was performed on 06/28/2021 which showed EF 30%, mild to moderate MR, and normal RV. The patient states that she has not been fully compliant with all her prescription medications.  She has not taken her prescription medicines including her torsemide, Xarelto, and diltiazem since her discharge from [facility].  Even prior to that, patient states that she has not been fully compliant with her medications.  She states that she does not use any cathartics at home.  She denies any hematochezia, melena, hematemesis. In the emergency department, patient was afebrile and hemodynamically stable.  She was noted to have atrial fibrillation with RVR.  Oxygen saturation was 96% on room air.  BMP was unremarkable.  WBC 8.3, hemoglobin 7.8, platelets 265,000.  LFTs were unremarkable.   Assessment/Plan: Atrial fibrillation with RVR -Initially on metoprolol succinate 25 mg bid -digoxin added by cardiology -06/29/21 Echo EF 30%, mild-mod MR -Soft blood pressures limit titration of her  chronotropic medications intially -Personally reviewed EKG--atrial fibrillation, nonspecific T wave change -cardiology consult appreciated -8/14 started IV lopressor as pt not tolerating po>>>transitioning to po metoprolol succinate by cardiology -8/16--digoxin x 1 -Anticoagulated with Xarelto -Heart rate is currently stable   Abnormal CT abdomen/cecal mass -GI consult appreciated--discussed with Dr. Abbey Chatters -06/28/2021 CT abdomen as discussed above -07/05/21 colonoscopy--partially obstructing cecal mass -general surgery consult appreciated -R-hemicolectomy 07/06/21--Dr. Arnoldo Morale -Postoperatively, patient developed significant nausea and vomiting and postop ileus -Currently on MiraLAX and Dulcolax suppositories -Now starting to have bowel movements -Advance diet as tolerated   Situational Anxiety -started in IV ativan by Dr. Arnoldo Morale initially -minimize opioids as she became very somnolent with combo on 07/03/21 -d/c ativan as pt is somnolent with morphine and ativan   Iron deficiency anemia -06/29/2021 iron saturation 4, ferritin 5 -dose nulecit x 2   Morbid obesity -BMI 46.33 -Lifestyle modification   Hypothyroidism -Continue Synthroid   Lymphedema -Currently on IV Lasix, transition back to torsemide once able to tolerate p.o.   Chronic systolic and Diastolic CHF -clinically euvolemic -restart home torsemide when tolerating po -lasix 20 mg IV daily for now  Hypokalemia/hypomagnesemia -Replace         Status is: Inpatient   The patient will require care spanning > 2 midnights and should be moved to inpatient because: Inpatient level of care appropriate due to severity of illness   Dispo: The patient is from: Home              Anticipated d/c is to: Home  Patient currently is not medically stable to d/c.              Difficult to place patient No               Family Communication:  no Family at bedside   Consultants:  GI, general surgery   Code  Status:  FULL    DVT Prophylaxis: Xarelto restarted 8/14     Procedures: As Listed in Progress Note Above   Antibiotics: None     Subjective: She is sleeping on my arrival.  Answers some questions, but falls back asleep.  Says that she has had multiple bowel movements today.  Denies any vomiting.  Objective: Vitals:   07/12/21 2116 07/13/21 0601 07/13/21 0755 07/13/21 0848  BP: (!) 117/57 (!) 97/49 106/62   Pulse: 79 72 (!) 56 (!) 108  Resp: '18 19 18   '$ Temp: 98.1 F (36.7 C) 98.5 F (36.9 C)    TempSrc: Oral Oral    SpO2: 96%  96%   Weight:      Height:        Intake/Output Summary (Last 24 hours) at 07/13/2021 1919 Last data filed at 07/13/2021 1736 Gross per 24 hour  Intake 1120 ml  Output 2 ml  Net 1118 ml   Weight change:  Exam:  General exam: Alert, awake, oriented x 3 Respiratory system: Clear to auscultation. Respiratory effort normal. Cardiovascular system:RRR. No murmurs, rubs, gallops. Gastrointestinal system: Abdomen is nondistended, soft and nontender. No organomegaly or masses felt. Normal bowel sounds heard. Central nervous system: Alert and oriented. No focal neurological deficits. Extremities: Bilateral lower extremity lymphedema Skin: No rashes, lesions or ulcers Psychiatry: Judgement and insight appear normal. Mood & affect appropriate.     Data Reviewed: I have personally reviewed following labs and imaging studies Basic Metabolic Panel: Recent Labs  Lab 07/07/21 0341 07/08/21 0314 07/09/21 0343 07/10/21 0453 07/11/21 0454 07/13/21 0611  NA 135 137 135 137 134* 133*  K 4.2 3.8 3.7 3.4* 3.5 3.1*  CL 106 102 101 101 103 99  CO2 '26 27 23 28 25 27  '$ GLUCOSE 133* 77 102* 80 83 101*  BUN '9 10 9 '$ 7* 9 10  CREATININE 0.85 0.85 0.76 0.69 0.82 0.94  CALCIUM 8.4* 8.2* 8.0* 8.2* 7.7* 8.2*  MG 1.7 2.0 1.9 1.8  --  1.6*  PHOS 3.3 2.8 2.9 2.8  --  2.8   Liver Function Tests: Recent Labs  Lab 07/13/21 0611  ALBUMIN 2.3*    No results for  input(s): LIPASE, AMYLASE in the last 168 hours. No results for input(s): AMMONIA in the last 168 hours. Coagulation Profile: No results for input(s): INR, PROTIME in the last 168 hours. CBC: Recent Labs  Lab 07/08/21 0314 07/09/21 0343 07/10/21 0453 07/11/21 0454 07/13/21 0611  WBC 10.1 11.5* 9.0 9.2 15.0*  HGB 7.8* 8.9* 8.4* 8.7* 9.2*  HCT 28.8* 33.3* 30.9* 31.4* 32.3*  MCV 75.2* 75.0* 74.5* 74.9* 75.5*  PLT 255 272 235 261 277   Cardiac Enzymes: No results for input(s): CKTOTAL, CKMB, CKMBINDEX, TROPONINI in the last 168 hours. BNP: Invalid input(s): POCBNP CBG: No results for input(s): GLUCAP in the last 168 hours.  HbA1C: No results for input(s): HGBA1C in the last 72 hours. Urine analysis:    Component Value Date/Time   COLORURINE YELLOW 03/26/2014 [place] 03/26/2014 1346   LABSPEC 1.011 03/26/2014 1346   PHURINE 5.0 03/26/2014 1346   GLUCOSEU NEGATIVE 03/26/2014 1346  HGBUR MODERATE (A) 03/26/2014 1346   BILIRUBINUR NEGATIVE 03/26/2014 1346   KETONESUR NEGATIVE 03/26/2014 1346   PROTEINUR NEGATIVE 03/26/2014 1346   UROBILINOGEN 0.2 03/26/2014 1346   NITRITE NEGATIVE 03/26/2014 1346   LEUKOCYTESUR NEGATIVE 03/26/2014 1346   Sepsis Labs: '@LABRCNTIP'$ (procalcitonin:4,lacticidven:4) ) Recent Results (from the past 240 hour(s))  SARS CORONAVIRUS 2 (TAT 6-24 HRS) Nasopharyngeal Nasopharyngeal Swab     Status: None   Collection Time: 07/03/21  7:45 PM   Specimen: Nasopharyngeal Swab  Result Value Ref Range Status   SARS Coronavirus 2 NEGATIVE NEGATIVE Final    Comment: (NOTE) SARS-CoV-2 target nucleic acids are NOT DETECTED.  The SARS-CoV-2 RNA is generally detectable in upper and lower respiratory specimens during the acute phase of infection. Negative results do not preclude SARS-CoV-2 infection, do not rule out co-infections with other pathogens, and should not be used as the sole basis for treatment or other patient management  decisions. Negative results must be combined with clinical observations, patient history, and epidemiological information. The expected result is Negative.  Fact Sheet for Patients: SugarRoll.be  Fact Sheet for Healthcare Providers: https://www.woods-mathews.com/  This test is not yet approved or cleared by the [place] FDA and  has been authorized for detection and/or diagnosis of SARS-CoV-2 by FDA under an Emergency Use Authorization (EUA). This EUA will remain  in effect (meaning this test can be used) for the duration of the COVID-19 declaration under Se ction 564(b)(1) of the Act, 21 U.S.C. section 360bbb-3(b)(1), unless the authorization is terminated or revoked sooner.  Performed at [facility], [place] [address] [place], [place] [address]   Surgical PCR screen     Status: None   Collection Time: 07/05/21  3:20 PM   Specimen: Nasal Mucosa; Nasal Swab  Result Value Ref Range Status   MRSA, PCR NEGATIVE NEGATIVE Final   Staphylococcus aureus NEGATIVE NEGATIVE Final    Comment: (NOTE) The Xpert SA Assay (FDA approved for NASAL specimens in patients 52 years of age and older), is one component of a comprehensive surveillance program. It is not intended to diagnose infection nor to guide or monitor treatment. Performed at [facility], [address] [place], [place] [address]      Scheduled Meds:  bisacodyl  10 mg Rectal BID   Chlorhexidine Gluconate Cloth  6 each Topical Daily   furosemide  20 mg Intravenous Daily   levothyroxine  50 mcg Intravenous Daily   metoCLOPramide (REGLAN) injection  10 mg Intravenous Q6H   metoprolol succinate  25 mg Oral BID   multivitamin with minerals  1 tablet Oral Daily   pantoprazole  40 mg Oral Q1200   polyethylene glycol  17 g Oral BID   Ensure Max Protein  11 oz Oral BID   rivaroxaban  20 mg Oral Q supper   sodium chloride flush  10-40 mL Intracatheter Q12H   Continuous  Infusions:  lactated ringers      Procedures/Studies: DG Abd 1 View  Result Date: 07/12/2021 CLINICAL DATA:  Constant EXAM: ABDOMEN - 1 VIEW COMPARISON:  Portable exam 1355 hours compared to 07/03/2021 FINDINGS: Gaseous distension of multiple small bowel loops throughout abdomen. Paucity of colonic gas though small amount gas is seen within the distal sigmoid colon and rectum. Surgical clips identified at the mid abdomen. Findings likely represent postoperative ileus. No bowel wall thickening or urinary tract calcification. Degenerative changes and levoconvex scoliosis lumbar spine. IMPRESSION: Gaseous distention of multiple small bowel loops throughout abdomen favoring postoperative ileus. Electronically  Signed   By: Lavonia Dana M.D.   On: 07/12/2021 14:28   DG Abdomen 1 View  Result Date: 07/03/2021 CLINICAL DATA:  Constipation EXAM: ABDOMEN - 1 VIEW COMPARISON:  CT pelvis June 28, 2021 FINDINGS: The bowel gas pattern is normal. Moderate volume of formed stool throughout the colon. Degenerative changes bilateral hips. Lumbar spondylosis. IMPRESSION: Nonobstructive bowel gas pattern. Moderate volume of formed stool throughout the colon. Electronically Signed   By: Dahlia Bailiff MD   On: 07/03/2021 17:45   DG CHEST PORT 1 VIEW  Result Date: 07/12/2021 CLINICAL DATA:  PICC line verification EXAM: PORTABLE CHEST 1 VIEW COMPARISON:  Portable exam 1352 hours compared to 06/28/2021 FINDINGS: RIGHT arm PICC line tip projects over SVC. Minimal enlargement of cardiac silhouette. Mediastinal contours and pulmonary vascularity normal. Subsegmental atelectasis LEFT base. Lungs otherwise clear. Tiny LEFT pleural effusion. No pneumothorax. IMPRESSION: LEFT basilar atelectasis and tiny pleural effusion. RIGHT arm PICC line tip projects over SVC. Electronically Signed   By: Lavonia Dana M.D.   On: 07/12/2021 14:27   [place] EKG SITE RITE  Result Date: 07/12/2021 If Site Rite image not attached, placement could not be  confirmed due to current cardiac rhythm.   Kathie Dike, MD  [facility]  If 7PM-7AM, please contact night-coverage [email or web address]  07/13/2021, 7:19 PM   LOS: 7 days

## 2021-07-13 NOTE — Care Management Important Message (Signed)
Important Message  Patient Details  Name: Lauren [name] MRN: [id number] Date of Birth: 1946-08-08   Medicare Important Message Given:  Yes     Tommy Medal 07/13/2021, 12:33 PM

## 2021-07-13 NOTE — Progress Notes (Signed)
[facility]   Patient reports multiple Bms and having flatus. No nausea/ wants more than the clears and really wants some potatoes and biscuit.   BP 106/62 (BP Location: Left Arm)   Pulse (!) 108   Temp 98.5 F (36.9 C) (Oral)   Resp 18   Ht '5\' 2"'$  (1.575 m)   Wt 114.9 kg   LMP 09/13/2016   SpO2 96%   BMI 46.33 kg/m  Abdomen soft, mildly distended epigastric region, midline with betadine on incision and honeycomb, no erythema or drainage  Up to chair and PT Soft diet SNF recommended by PT  Patient requesting more for sleep and muscle relaxer, deferred to Dr. Roderic Palau.   Curlene Labrum, MD [facility] [address] [place], [place] [address] [phone] (office)

## 2021-07-13 NOTE — TOC Progression Note (Signed)
Transition of Care [facility]) - Progression Note    Patient Details  Name: Lauren [name] MRN: [id number] Date of Birth: December 13, 1945  [facility]) CM/SW Contact  Natasha Bence, LCSW Phone Number: 07/13/2021, 12:20 PM  Clinical Narrative:    Patient reported that she is still deliberating between [facility] and SNF. CSW notified patient that [facility] only [facility] offer. Auth started on 08/18. Pending facility. Patient reported that she preferred to go home with [facility] if she is able. TOC to follow.    Expected Discharge Plan: Skilled Nursing Facility Barriers to Discharge: Continued Medical Work up  Expected Discharge Plan and Services Expected Discharge Plan: [place]                                               Social Determinants of Health (SDOH) Interventions    Readmission Risk Interventions Readmission Risk Prevention Plan 07/12/2021  Transportation Screening Complete  Home Care Screening Complete  Medication Review (RN CM) Complete  Some recent data might be hidden

## 2021-07-14 LAB — CBC
HCT: 25.4 % — ABNORMAL LOW (ref 36.0–46.0)
HCT: 29.1 % — ABNORMAL LOW (ref 36.0–46.0)
Hemoglobin: 7 g/dL — ABNORMAL LOW (ref 12.0–15.0)
Hemoglobin: 8.3 g/dL — ABNORMAL LOW (ref 12.0–15.0)
MCH: 21.5 pg — ABNORMAL LOW (ref 26.0–34.0)
MCH: 21.7 pg — ABNORMAL LOW (ref 26.0–34.0)
MCHC: 27.6 g/dL — ABNORMAL LOW (ref 30.0–36.0)
MCHC: 28.5 g/dL — ABNORMAL LOW (ref 30.0–36.0)
MCV: 77.9 fL — ABNORMAL LOW (ref 80.0–100.0)
MCV: 76.2 fL — ABNORMAL LOW (ref 80.0–100.0)
Platelets: 184 10*3/uL (ref 150–400)
Platelets: 223 10*3/uL (ref 150–400)
RBC: 3.26 MIL/uL — ABNORMAL LOW (ref 3.87–5.11)
RBC: 3.82 MIL/uL — ABNORMAL LOW (ref 3.87–5.11)
RDW: 26 % — ABNORMAL HIGH (ref 11.5–15.5)
RDW: 26.5 % — ABNORMAL HIGH (ref 11.5–15.5)
WBC: 9.8 10*3/uL (ref 4.0–10.5)
WBC: 10.4 10*3/uL (ref 4.0–10.5)
nRBC: 0 % (ref 0.0–0.2)
nRBC: 0 % (ref 0.0–0.2)

## 2021-07-14 LAB — RENAL FUNCTION PANEL
Albumin: 1.9 g/dL — ABNORMAL LOW (ref 3.5–5.0)
Anion gap: 8 (ref 5–15)
BUN: 14 mg/dL (ref 8–23)
CO2: 27 mmol/L (ref 22–32)
Calcium: 8.2 mg/dL — ABNORMAL LOW (ref 8.9–10.3)
Chloride: 98 mmol/L (ref 98–111)
Creatinine, Ser: 1.1 mg/dL — ABNORMAL HIGH (ref 0.44–1.00)
GFR, Estimated: 53 mL/min — ABNORMAL LOW (ref >60)
Glucose, Bld: 76 mg/dL (ref 70–99)
Phosphorus: 3 mg/dL (ref 2.5–4.6)
Potassium: 3.3 mmol/L — ABNORMAL LOW (ref 3.5–5.1)
Sodium: 133 mmol/L — ABNORMAL LOW (ref 135–145)

## 2021-07-14 LAB — MAGNESIUM: Magnesium: 2 mg/dL (ref 1.7–2.4)

## 2021-07-14 MED ORDER — TORSEMIDE 20 MG PO TABS
20.0000 mg | ORAL_TABLET | Freq: Every day | ORAL | Status: DC
  Administered 2021-07-15 – 2021-07-17 (×3): 20 mg via ORAL
  Filled 2021-07-14 (×3): qty 1

## 2021-07-14 MED ORDER — POTASSIUM CHLORIDE CRYS ER 20 MEQ PO TBCR
40.0000 meq | EXTENDED_RELEASE_TABLET | Freq: Once | ORAL | Status: AC
  Administered 2021-07-14: 40 meq via ORAL
  Filled 2021-07-14: qty 2

## 2021-07-14 MED ORDER — LEVOTHYROXINE SODIUM 100 MCG PO TABS
100.0000 ug | ORAL_TABLET | Freq: Every day | ORAL | Status: DC
  Administered 2021-07-15 – 2021-07-17 (×3): 100 ug via ORAL
  Filled 2021-07-14 (×6): qty 1

## 2021-07-14 MED ORDER — GERHARDT'S BUTT CREAM
TOPICAL_CREAM | CUTANEOUS | Status: DC | PRN
  Filled 2021-07-14: qty 1

## 2021-07-14 MED ORDER — POLYETHYLENE GLYCOL 3350 17 G PO PACK
17.0000 g | PACK | Freq: Every day | ORAL | Status: DC | PRN
  Filled 2021-07-14: qty 1

## 2021-07-14 NOTE — Progress Notes (Signed)
PROGRESS NOTE  Lauren [name] [id number] DOB: 10-29-1946 DOA: 07/03/2021 PCP: Pcp, No  Brief History:  75 year old female with a history of atrial fibrillation, morbid obesity, diastolic CHF, hypertension, lymphedema presenting with constipation and rectal pain of 2 day duration.  The patient was recently admitted to [facility] from 06/28/2021 to 06/30/2021 for constipation.  During that admission, the patient was noted to have a hemoglobin of 5.6.  She was transfused with 2 units PRBC.  Her discharge hemoglobin was 8.  She had a CT of the abdomen and pelvis which showed cecal thickening raising questions of a possible cecal mass with small adjacent lymph nodes.  There is also skin thickening over her pannus.  The patient was given cathartics and ultimately had a bowel movement.  The patient was scheduled to have colonoscopy performed by general surgery, but she left AGAINST MEDICAL ADVICE prior to this being done.  An echocardiogram was performed on 06/28/2021 which showed EF 30%, mild to moderate MR, and normal RV. The patient states that she has not been fully compliant with all her prescription medications.  She has not taken her prescription medicines including her torsemide, Xarelto, and diltiazem since her discharge from [facility].  Even prior to that, patient states that she has not been fully compliant with her medications.  She states that she does not use any cathartics at home.  She denies any hematochezia, melena, hematemesis. In the emergency department, patient was afebrile and hemodynamically stable.  She was noted to have atrial fibrillation with RVR.  Oxygen saturation was 96% on room air.  BMP was unremarkable.  WBC 8.3, hemoglobin 7.8, platelets 265,000.  LFTs were unremarkable.   Assessment/Plan: Atrial fibrillation with RVR -Initially on metoprolol succinate 25 mg bid -digoxin added by cardiology -06/29/21 Echo EF 30%, mild-mod MR -Soft blood pressures limit titration of her  chronotropic medications intially -Personally reviewed EKG--atrial fibrillation, nonspecific T wave change -cardiology consult appreciated -8/14 started IV lopressor as pt not tolerating po>>>transitioning to po metoprolol succinate by cardiology -8/16--digoxin x 1 -Anticoagulated with Xarelto -Heart rate is currently stable   Abnormal CT abdomen/cecal mass -GI consult appreciated--discussed with Dr. Abbey Chatters -06/28/2021 CT abdomen as discussed above -07/05/21 colonoscopy--partially obstructing cecal mass -general surgery consult appreciated -R-hemicolectomy 07/06/21--Dr. Arnoldo Morale -Postoperatively, patient developed significant nausea and vomiting and postop ileus -Treated with MiraLAX and Dulcolax suppositories -Now starting to have bowel movements -She appears to be tolerating a diet without vomiting   Situational Anxiety -started in IV ativan by Dr. Arnoldo Morale initially -minimize opioids as she became very somnolent with combo on 07/03/21 -d/c ativan as pt is somnolent with morphine and ativan   Iron deficiency anemia -06/29/2021 iron saturation 4, ferritin 5 -dose nulecit x 2 -Hemoglobin mostly stable in the 8 range -We will plan on discharging with oral iron   Morbid obesity -BMI 46.33 -Lifestyle modification   Hypothyroidism -Continue Synthroid   Lymphedema -Transition diuretics back to oral torsemide   Chronic systolic and Diastolic CHF -clinically euvolemic -Continue on home dose of torsemide  Hypokalemia/hypomagnesemia -Replace         Status is: Inpatient   The patient will require care spanning > 2 midnights and should be moved to inpatient because: Inpatient level of care appropriate due to severity of illness   Dispo: The patient is from: Home              Anticipated d/c is to: Home  Patient currently is not medically stable to d/c.              Difficult to place patient No               Family Communication:  no Family at bedside    Consultants:  GI, general surgery   Code Status:  FULL    DVT Prophylaxis: Xarelto restarted 8/14     Procedures: As Listed in Progress Note Above   Antibiotics: None     Subjective: She has been having bowel movements.  She does not have any vomiting.  Continues to have abdominal pain over incision site.  Objective: Vitals:   07/13/21 0848 07/13/21 2033 07/14/21 0448 07/14/21 1823  BP:  (!) 114/59 (!) 108/57 99/67  Pulse: (!) 108 73 83 80  Resp:  '16 18 16  '$ Temp:  98.1 F (36.7 C) 98.3 F (36.8 C) 98.1 F (36.7 C)  TempSrc:  Oral Oral Oral  SpO2:  96% 95% 96%  Weight:      Height:        Intake/Output Summary (Last 24 hours) at 07/14/2021 2032 Last data filed at 07/14/2021 1900 Gross per 24 hour  Intake 360 ml  Output 7 ml  Net 353 ml   Weight change:  Exam:  General exam: Alert, awake, oriented x 3 Respiratory system: Clear to auscultation. Respiratory effort normal. Cardiovascular system:RRR. No murmurs, rubs, gallops. Gastrointestinal system: Abdomen is nondistended, soft and nontender. No organomegaly or masses felt. Normal bowel sounds heard. Central nervous system: Alert and oriented. No focal neurological deficits. Extremities: Bilateral lower extremity lymphedema Skin: No rashes, lesions or ulcers Psychiatry: Judgement and insight appear normal. Mood & affect appropriate.      Data Reviewed: I have personally reviewed following labs and imaging studies Basic Metabolic Panel: Recent Labs  Lab 07/08/21 0314 07/09/21 0343 07/10/21 0453 07/11/21 0454 07/13/21 0611 07/14/21 0500 07/14/21 0621  NA 137 135 137 134* 133* 133*  --   K 3.8 3.7 3.4* 3.5 3.1* 3.3*  --   CL 102 101 101 103 99 98  --   CO2 '27 23 28 25 27 27  '$ --   GLUCOSE 77 102* 80 83 101* 76  --   BUN 10 9 7* '9 10 14  '$ --   CREATININE 0.85 0.76 0.69 0.82 0.94 1.10*  --   CALCIUM 8.2* 8.0* 8.2* 7.7* 8.2* 8.2*  --   MG 2.0 1.9 1.8  --  1.6*  --  2.0  PHOS 2.8 2.9 2.8  --  2.8 3.0   --    Liver Function Tests: Recent Labs  Lab 07/13/21 0611 07/14/21 0500  ALBUMIN 2.3* 1.9*    No results for input(s): LIPASE, AMYLASE in the last 168 hours. No results for input(s): AMMONIA in the last 168 hours. Coagulation Profile: No results for input(s): INR, PROTIME in the last 168 hours. CBC: Recent Labs  Lab 07/10/21 0453 07/11/21 0454 07/13/21 0611 07/14/21 0621 07/14/21 1401  WBC 9.0 9.2 15.0* 9.8 10.4  HGB 8.4* 8.7* 9.2* 7.0* 8.3*  HCT 30.9* 31.4* 32.3* 25.4* 29.1*  MCV 74.5* 74.9* 75.5* 77.9* 76.2*  PLT 235 261 277 184 223   Cardiac Enzymes: No results for input(s): CKTOTAL, CKMB, CKMBINDEX, TROPONINI in the last 168 hours. BNP: Invalid input(s): POCBNP CBG: No results for input(s): GLUCAP in the last 168 hours.  HbA1C: No results for input(s): HGBA1C in the last 72 hours. Urine analysis:    Component Value  Date/Time   COLORURINE YELLOW 03/26/2014 1346   APPEARANCEUR CLEAR 03/26/2014 1346   LABSPEC 1.011 03/26/2014 1346   PHURINE 5.0 03/26/2014 1346   GLUCOSEU NEGATIVE 03/26/2014 1346   HGBUR MODERATE (A) 03/26/2014 1346   BILIRUBINUR NEGATIVE 03/26/2014 1346   KETONESUR NEGATIVE 03/26/2014 1346   PROTEINUR NEGATIVE 03/26/2014 1346   UROBILINOGEN 0.2 03/26/2014 1346   NITRITE NEGATIVE 03/26/2014 1346   LEUKOCYTESUR NEGATIVE 03/26/2014 1346   Sepsis Labs: '@LABRCNTIP'$ (procalcitonin:4,lacticidven:4) ) Recent Results (from the past 240 hour(s))  Surgical PCR screen     Status: None   Collection Time: 07/05/21  3:20 PM   Specimen: Nasal Mucosa; Nasal Swab  Result Value Ref Range Status   MRSA, PCR NEGATIVE NEGATIVE Final   Staphylococcus aureus NEGATIVE NEGATIVE Final    Comment: (NOTE) The Xpert SA Assay (FDA approved for NASAL specimens in patients 29 years of age and older), is one component of a comprehensive surveillance program. It is not intended to diagnose infection nor to guide or monitor treatment. Performed at [facility],  [address] [place], [place] [address]      Scheduled Meds:  Chlorhexidine Gluconate Cloth  6 each Topical Daily   [START ON 07/15/2021] levothyroxine  100 mcg Oral Q0600   metoprolol succinate  25 mg Oral BID   multivitamin with minerals  1 tablet Oral Daily   pantoprazole  40 mg Oral Q1200   Ensure Max Protein  11 oz Oral BID   rivaroxaban  20 mg Oral Q supper   sodium chloride flush  10-40 mL Intracatheter Q12H   [START ON 07/15/2021] torsemide  20 mg Oral Daily   Continuous Infusions:  lactated ringers      Procedures/Studies: DG Abd 1 View  Result Date: 07/12/2021 CLINICAL DATA:  Constant EXAM: ABDOMEN - 1 VIEW COMPARISON:  Portable exam 1355 hours compared to 07/03/2021 FINDINGS: Gaseous distension of multiple small bowel loops throughout abdomen. Paucity of colonic gas though small amount gas is seen within the distal sigmoid colon and rectum. Surgical clips identified at the mid abdomen. Findings likely represent postoperative ileus. No bowel wall thickening or urinary tract calcification. Degenerative changes and levoconvex scoliosis lumbar spine. IMPRESSION: Gaseous distention of multiple small bowel loops throughout abdomen favoring postoperative ileus. Electronically Signed   By: Lavonia Dana M.D.   On: 07/12/2021 14:28   DG Abdomen 1 View  Result Date: 07/03/2021 CLINICAL DATA:  Constipation EXAM: ABDOMEN - 1 VIEW COMPARISON:  CT pelvis June 28, 2021 FINDINGS: The bowel gas pattern is normal. Moderate volume of formed stool throughout the colon. Degenerative changes bilateral hips. Lumbar spondylosis. IMPRESSION: Nonobstructive bowel gas pattern. Moderate volume of formed stool throughout the colon. Electronically Signed   By: Dahlia Bailiff MD   On: 07/03/2021 17:45   DG CHEST PORT 1 VIEW  Result Date: 07/12/2021 CLINICAL DATA:  PICC line verification EXAM: PORTABLE CHEST 1 VIEW COMPARISON:  Portable exam 1352 hours compared to 06/28/2021 FINDINGS: RIGHT arm PICC line tip  projects over SVC. Minimal enlargement of cardiac silhouette. Mediastinal contours and pulmonary vascularity normal. Subsegmental atelectasis LEFT base. Lungs otherwise clear. Tiny LEFT pleural effusion. No pneumothorax. IMPRESSION: LEFT basilar atelectasis and tiny pleural effusion. RIGHT arm PICC line tip projects over SVC. Electronically Signed   By: Lavonia Dana M.D.   On: 07/12/2021 14:27   [place] EKG SITE RITE  Result Date: 07/12/2021 If Site Rite image not attached, placement could not be confirmed due to current cardiac rhythm.   [employer],  MD  [facility]  If 7PM-7AM, please contact night-coverage [email or web address]  07/14/2021, 8:32 PM   LOS: 8 days

## 2021-07-14 NOTE — Progress Notes (Signed)
[facility]  Eating and having Bms. Laying in bed and says she is deciding if she will do a SNF.   BP (!) 108/57 (BP Location: Left Arm)   Pulse 83   Temp 98.3 F (36.8 C) (Oral)   Resp 18   Ht '5\' 2"'$  (1.575 m)   Wt 114.9 kg   LMP 09/13/2016   SpO2 95%   BMI 46.33 kg/m  Midline c/d/I with staples, no erythema or drainage, honeycomb removed since it is 1 week out   Continue diet PT recommended SNF  Curlene Labrum, MD [facility] [place], [place] [address] [phone] (office)

## 2021-07-15 LAB — CBC
HCT: 30.3 % — ABNORMAL LOW (ref 36.0–46.0)
Hemoglobin: 8.6 g/dL — ABNORMAL LOW (ref 12.0–15.0)
MCH: 21.8 pg — ABNORMAL LOW (ref 26.0–34.0)
MCHC: 28.4 g/dL — ABNORMAL LOW (ref 30.0–36.0)
MCV: 76.9 fL — ABNORMAL LOW (ref 80.0–100.0)
Platelets: 264 10*3/uL (ref 150–400)
RBC: 3.94 MIL/uL (ref 3.87–5.11)
RDW: 27.3 % — ABNORMAL HIGH (ref 11.5–15.5)
WBC: 10.3 10*3/uL (ref 4.0–10.5)
nRBC: 0 % (ref 0.0–0.2)

## 2021-07-15 LAB — BASIC METABOLIC PANEL
Anion gap: 7 (ref 5–15)
BUN: 14 mg/dL (ref 8–23)
CO2: 26 mmol/L (ref 22–32)
Calcium: 7.8 mg/dL — ABNORMAL LOW (ref 8.9–10.3)
Chloride: 100 mmol/L (ref 98–111)
Creatinine, Ser: 0.94 mg/dL (ref 0.44–1.00)
GFR, Estimated: >60 mL/min (ref >60)
Glucose, Bld: 82 mg/dL (ref 70–99)
Potassium: 3.4 mmol/L — ABNORMAL LOW (ref 3.5–5.1)
Sodium: 133 mmol/L — ABNORMAL LOW (ref 135–145)

## 2021-07-15 MED ORDER — LOPERAMIDE HCL 2 MG PO CAPS
2.0000 mg | ORAL_CAPSULE | Freq: Once | ORAL | Status: AC
  Administered 2021-07-16: 2 mg via ORAL
  Filled 2021-07-15: qty 1

## 2021-07-15 MED ORDER — LOPERAMIDE HCL 2 MG PO CAPS
4.0000 mg | ORAL_CAPSULE | Freq: Once | ORAL | Status: AC
  Administered 2021-07-15: 4 mg via ORAL
  Filled 2021-07-15: qty 2

## 2021-07-15 MED ORDER — POTASSIUM CHLORIDE 10 MEQ/100ML IV SOLN
10.0000 meq | INTRAVENOUS | Status: AC
  Administered 2021-07-15 (×4): 10 meq via INTRAVENOUS
  Filled 2021-07-15 (×4): qty 100
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
## 2021-07-15 NOTE — Progress Notes (Signed)
9 Days Post-Op  Subjective: Having multiple bowel movements.  Tolerating regular diet well.  No abdominal pain noted.  Objective: Vital signs in last 24 hours: Temp:  [98.1 F (36.7 C)-99 F (37.2 C)] 99 F (37.2 C) (08/21 0550) Pulse Rate:  [80-98] 98 (08/21 0550) Resp:  [16-18] 18 (08/21 0550) BP: (99-105)/(49-67) 105/65 (08/21 0904) SpO2:  [94 %-96 %] 94 % (08/21 0550) Last BM Date: 07/15/21  Intake/Output from previous day: 08/20 0701 - 08/21 0700 In: 360 [P.O.:360] Out: 7 [Urine:3; Stool:4] Intake/Output this shift: No intake/output data recorded.  General appearance: alert, cooperative, and no distress GI: Soft, nontender, nondistended.  Bowel sounds active.  Incision healing well.  Lab Results:  Recent Labs    07/14/21 1401 07/15/21 0616  WBC 10.4 10.3  HGB 8.3* 8.6*  HCT 29.1* 30.3*  PLT 223 264   BMET Recent Labs    07/14/21 0500 07/15/21 0616  NA 133* 133*  K 3.3* 3.4*  CL 98 100  CO2 27 26  GLUCOSE 76 82  BUN 14 14  CREATININE 1.10* 0.94  CALCIUM 8.2* 7.8*   PT/INR No results for input(s): LABPROT, INR in the last 72 hours.  Studies/Results: No results found.  Anti-infectives: Anti-infectives (From admission, onward)    Start     Dose/Rate Route Frequency Ordered Stop   07/06/21 1200  ertapenem (INVANZ) 1,000 mg in sodium chloride 0.9 % 100 mL IVPB        1 g 200 mL/hr over 30 Minutes Intravenous On call to O.R. 07/05/21 1453 07/06/21 1113   07/06/21 0941  sodium chloride 0.9 % with ertapenem [facility]) ADS Med       Note to Pharmacy: Augustin Schooling   : cabinet override      07/06/21 0941 07/06/21 1058   07/05/21 1600  neomycin (MYCIFRADIN) tablet 1,000 mg  Status:  Discontinued        1,000 mg Oral Every 4 hours 07/05/21 1438 07/06/21 0359   07/05/21 1530  metroNIDAZOLE (FLAGYL) IVPB 500 mg        500 mg 100 mL/hr over 60 Minutes Intravenous Every 4 hours 07/05/21 1438 07/06/21 0126       Assessment/Plan: s/p  Procedure(s): RIGHT HEMICOLECTOMY Impression: Doing well on postoperative day 9.  Patient's bowel function has returned.  She is deconditioned and do recommend SNF placement.  We will remove her staples prior to discharge.  Oncology follow-up once she has completed rehab.  LOS: 9 days    [name] Signs 07/15/2021

## 2021-07-15 NOTE — TOC Progression Note (Signed)
Transition of Care [facility]) - Progression Note    Patient Details  Name: SENAI LEBOVITS MRN: [id number] Date of Birth: 05-26-1946  Transition of Care [facility] CM/SW Contact  Natasha Bence, LCSW Phone Number: 07/15/2021, 4:18 PM  Clinical Narrative:    CSW notified of patient's auth denied due to first submission stating patient was not medically ready. CSW resubmitted auth. Facility Pending due to [facility] agreeable to review patient Monday. Patient will not be receiving Chemo or radiation treatments. Mardene Celeste with [facility] reported that patient's disposition with potential cancer treatment would be the the major factor for referral. [facility] also placed bed offer. TOC to follow.    Expected Discharge Plan: Skilled Nursing Facility Barriers to Discharge: Continued Medical Work up  Expected Discharge Plan and Services Expected Discharge Plan: [place]                                               Social Determinants of Health (SDOH) Interventions    Readmission Risk Interventions Readmission Risk Prevention Plan 07/12/2021  Transportation Screening Complete  Home Care Screening Complete  Medication Review (RN CM) Complete  Some recent data might be hidden

## 2021-07-15 NOTE — Progress Notes (Signed)
PROGRESS NOTE  Lauren [name] [id number] DOB: Feb 18, 1946 DOA: 07/03/2021 PCP: Pcp, No  Brief History:  75 year old female with a history of atrial fibrillation, morbid obesity, diastolic CHF, hypertension, lymphedema presenting with constipation and rectal pain of 2 day duration.  The patient was recently admitted to [facility] from 06/28/2021 to 06/30/2021 for constipation.  During that admission, the patient was noted to have a hemoglobin of 5.6.  She was transfused with 2 units PRBC.  Her discharge hemoglobin was 8.  She had a CT of the abdomen and pelvis which showed cecal thickening raising questions of a possible cecal mass with small adjacent lymph nodes.  There is also skin thickening over her pannus.  The patient was given cathartics and ultimately had a bowel movement.  The patient was scheduled to have colonoscopy performed by general surgery, but she left AGAINST MEDICAL ADVICE prior to this being done.  An echocardiogram was performed on 06/28/2021 which showed EF 30%, mild to moderate MR, and normal RV. The patient states that she has not been fully compliant with all her prescription medications.  She has not taken her prescription medicines including her torsemide, Xarelto, and diltiazem since her discharge from [facility].  Even prior to that, patient states that she has not been fully compliant with her medications.  She states that she does not use any cathartics at home.  She denies any hematochezia, melena, hematemesis. In the emergency department, patient was afebrile and hemodynamically stable.  She was noted to have atrial fibrillation with RVR.  Oxygen saturation was 96% on room air.  BMP was unremarkable.  WBC 8.3, hemoglobin 7.8, platelets 265,000.  LFTs were unremarkable.   Assessment/Plan: Atrial fibrillation with RVR -Initially on metoprolol succinate 25 mg bid -digoxin added by cardiology -06/29/21 Echo EF 30%, mild-mod MR -Soft blood pressures limit titration of her  chronotropic medications intially -Personally reviewed EKG--atrial fibrillation, nonspecific T wave change -cardiology consult appreciated -8/14 started IV lopressor as pt not tolerating po>>>transitioning to po metoprolol succinate by cardiology -8/16--digoxin x 1 -Anticoagulated with Xarelto -Heart rate is currently stable   Abnormal CT abdomen/cecal mass -GI consult appreciated--discussed with Dr. Abbey Chatters -06/28/2021 CT abdomen as discussed above -07/05/21 colonoscopy--partially obstructing cecal mass -general surgery consult appreciated -R-hemicolectomy 07/06/21--Dr. Arnoldo Morale -Postoperatively, patient developed significant nausea and vomiting and postop ileus -Treated with MiraLAX and Dulcolax suppositories -Now starting to have bowel movements -She appears to be tolerating a diet without vomiting   Situational Anxiety -started in IV ativan by Dr. Arnoldo Morale initially -minimize opioids as she became very somnolent with combo on 07/03/21 -d/c ativan as pt is somnolent with morphine and ativan   Iron deficiency anemia -06/29/2021 iron saturation 4, ferritin 5 -dose nulecit x 2 -Hemoglobin mostly stable in the 8 range -We will plan on discharging with oral iron   Morbid obesity -BMI 46.33 -Lifestyle modification   Hypothyroidism -Continue Synthroid   Lymphedema -continue torsemide   Chronic systolic and Diastolic CHF -clinically euvolemic -Continue on home dose of torsemide  Hypokalemia/hypomagnesemia -Replace         Status is: Inpatient   The patient will require care spanning > 2 midnights and should be moved to inpatient because: Inpatient level of care appropriate due to severity of illness   Dispo: The patient is from: Home              Anticipated d/c is to: SNF  Patient currently is medically stable to d/c.              Difficult to place patient No               Family Communication:  no Family at bedside   Consultants:  GI, general  surgery   Code Status:  FULL    DVT Prophylaxis: Xarelto      Procedures: As Listed in Progress Note Above   Antibiotics: None     Subjective: Reports frequent loose stools today, confirmed by staff. Feels that skin is "raw" around her groin/bottom.  Objective: Vitals:   07/14/21 1823 07/15/21 0550 07/15/21 0900 07/15/21 0904  BP: 99/67 (!) 104/49 105/65 105/65  Pulse: 80 98    Resp: 16 18    Temp: 98.1 F (36.7 C) 99 F (37.2 C)    TempSrc: Oral     SpO2: 96% 94%    Weight:      Height:        Intake/Output Summary (Last 24 hours) at 07/15/2021 1814 Last data filed at 07/15/2021 0950 Gross per 24 hour  Intake 480 ml  Output --  Net 480 ml   Weight change:  Exam:  General exam: Alert, awake, oriented x 3 Respiratory system: Clear to auscultation. Respiratory effort normal. Cardiovascular system:RRR. No murmurs, rubs, gallops. Gastrointestinal system: Abdomen is nondistended, soft and nontender. No organomegaly or masses felt. Normal bowel sounds heard. Incision healing well Central nervous system: Alert and oriented. No focal neurological deficits. Extremities: bilateral LE lymph edema Skin: No rashes, lesions or ulcers Psychiatry: Judgement and insight appear normal. Mood & affect appropriate.     Data Reviewed: I have personally reviewed following labs and imaging studies Basic Metabolic Panel: Recent Labs  Lab 07/09/21 0343 07/10/21 0453 07/11/21 0454 07/13/21 0611 07/14/21 0500 07/14/21 0621 07/15/21 0616  NA 135 137 134* 133* 133*  --  133*  K 3.7 3.4* 3.5 3.1* 3.3*  --  3.4*  CL 101 101 103 99 98  --  100  CO2 '23 28 25 27 27  '$ --  26  GLUCOSE 102* 80 83 101* 76  --  82  BUN 9 7* '9 10 14  '$ --  14  CREATININE 0.76 0.69 0.82 0.94 1.10*  --  0.94  CALCIUM 8.0* 8.2* 7.7* 8.2* 8.2*  --  7.8*  MG 1.9 1.8  --  1.6*  --  2.0  --   PHOS 2.9 2.8  --  2.8 3.0  --   --    Liver Function Tests: Recent Labs  Lab 07/13/21 0611 07/14/21 0500  ALBUMIN  2.3* 1.9*    No results for input(s): LIPASE, AMYLASE in the last 168 hours. No results for input(s): AMMONIA in the last 168 hours. Coagulation Profile: No results for input(s): INR, PROTIME in the last 168 hours. CBC: Recent Labs  Lab 07/11/21 0454 07/13/21 0611 07/14/21 0621 07/14/21 1401 07/15/21 0616  WBC 9.2 15.0* 9.8 10.4 10.3  HGB 8.7* 9.2* 7.0* 8.3* 8.6*  HCT 31.4* 32.3* 25.4* 29.1* 30.3*  MCV 74.9* 75.5* 77.9* 76.2* 76.9*  PLT 261 277 184 223 264   Cardiac Enzymes: No results for input(s): CKTOTAL, CKMB, CKMBINDEX, TROPONINI in the last 168 hours. BNP: Invalid input(s): POCBNP CBG: No results for input(s): GLUCAP in the last 168 hours.  HbA1C: No results for input(s): HGBA1C in the last 72 hours. Urine analysis:    Component Value Date/Time   COLORURINE YELLOW 03/26/2014 1346  APPEARANCEUR CLEAR 03/26/2014 1346   LABSPEC 1.011 03/26/2014 1346   PHURINE 5.0 03/26/2014 1346   GLUCOSEU NEGATIVE 03/26/2014 1346   HGBUR MODERATE (A) 03/26/2014 1346   BILIRUBINUR NEGATIVE 03/26/2014 1346   KETONESUR NEGATIVE 03/26/2014 1346   PROTEINUR NEGATIVE 03/26/2014 1346   UROBILINOGEN 0.2 03/26/2014 1346   NITRITE NEGATIVE 03/26/2014 1346   LEUKOCYTESUR NEGATIVE 03/26/2014 1346   Sepsis Labs: '@LABRCNTIP'$ (procalcitonin:4,lacticidven:4) ) No results found for this or any previous visit (from the past 240 hour(s)).    Scheduled Meds:  Chlorhexidine Gluconate Cloth  6 each Topical Daily   levothyroxine  100 mcg Oral Q0600   loperamide  4 mg Oral Once   metoprolol succinate  25 mg Oral BID   multivitamin with minerals  1 tablet Oral Daily   pantoprazole  40 mg Oral Q1200   Ensure Max Protein  11 oz Oral BID   rivaroxaban  20 mg Oral Q supper   sodium chloride flush  10-40 mL Intracatheter Q12H   torsemide  20 mg Oral Daily   Continuous Infusions:  lactated ringers     potassium chloride      Procedures/Studies: DG Abd 1 View  Result Date:  07/12/2021 CLINICAL DATA:  Constant EXAM: ABDOMEN - 1 VIEW COMPARISON:  Portable exam 1355 hours compared to 07/03/2021 FINDINGS: Gaseous distension of multiple small bowel loops throughout abdomen. Paucity of colonic gas though small amount gas is seen within the distal sigmoid colon and rectum. Surgical clips identified at the mid abdomen. Findings likely represent postoperative ileus. No bowel wall thickening or urinary tract calcification. Degenerative changes and levoconvex scoliosis lumbar spine. IMPRESSION: Gaseous distention of multiple small bowel loops throughout abdomen favoring postoperative ileus. Electronically Signed   By: Lavonia Dana M.D.   On: 07/12/2021 14:28   DG Abdomen 1 View  Result Date: 07/03/2021 CLINICAL DATA:  Constipation EXAM: ABDOMEN - 1 VIEW COMPARISON:  CT pelvis June 28, 2021 FINDINGS: The bowel gas pattern is normal. Moderate volume of formed stool throughout the colon. Degenerative changes bilateral hips. Lumbar spondylosis. IMPRESSION: Nonobstructive bowel gas pattern. Moderate volume of formed stool throughout the colon. Electronically Signed   By: Dahlia Bailiff MD   On: 07/03/2021 17:45   DG CHEST PORT 1 VIEW  Result Date: 07/12/2021 CLINICAL DATA:  PICC line verification EXAM: PORTABLE CHEST 1 VIEW COMPARISON:  Portable exam 1352 hours compared to 06/28/2021 FINDINGS: RIGHT arm PICC line tip projects over SVC. Minimal enlargement of cardiac silhouette. Mediastinal contours and pulmonary vascularity normal. Subsegmental atelectasis LEFT base. Lungs otherwise clear. Tiny LEFT pleural effusion. No pneumothorax. IMPRESSION: LEFT basilar atelectasis and tiny pleural effusion. RIGHT arm PICC line tip projects over SVC. Electronically Signed   By: Lavonia Dana M.D.   On: 07/12/2021 14:27   [place] EKG SITE RITE  Result Date: 07/12/2021 If Site Rite image not attached, placement could not be confirmed due to current cardiac rhythm.   Kathie Dike, MD  [facility]  If 7PM-7AM, please contact night-coverage [email or web address]  07/15/2021, 6:14 PM   LOS: 9 days

## 2021-07-15 NOTE — Progress Notes (Signed)
Staples removed from patient's incision per Dr. Arnoldo Morale and steristrips placed. Covered with ABD pad and paper tape. Incision clean and dry.

## 2021-07-16 LAB — C DIFFICILE (CDIFF) QUICK SCRN (NO PCR REFLEX)
C Diff antigen: NEGATIVE
C Diff interpretation: NOT DETECTED
C Diff toxin: NEGATIVE

## 2021-07-16 MED ORDER — LOPERAMIDE HCL 2 MG PO CAPS
2.0000 mg | ORAL_CAPSULE | Freq: Four times a day (QID) | ORAL | Status: DC | PRN
  Administered 2021-07-16 (×2): 2 mg via ORAL
  Filled 2021-07-16 (×2): qty 1

## 2021-07-16 NOTE — Progress Notes (Signed)
PROGRESS NOTE  Lauren [name] [id number] DOB: [birth year] DOA: 07/03/2021 PCP: Pcp, No  Brief History:  75 year old female with a history of atrial fibrillation, morbid obesity, diastolic CHF, hypertension, lymphedema presenting with constipation and rectal pain of 2 day duration.  The patient was recently admitted to [facility] from 06/28/2021 to 06/30/2021 for constipation.  During that admission, the patient was noted to have a hemoglobin of 5.6.  She was transfused with 2 units PRBC.  Her discharge hemoglobin was 8.  She had a CT of the abdomen and pelvis which showed cecal thickening raising questions of a possible cecal mass with small adjacent lymph nodes.  There is also skin thickening over her pannus.  The patient was given cathartics and ultimately had a bowel movement.  The patient was scheduled to have colonoscopy performed by general surgery, but she left AGAINST MEDICAL ADVICE prior to this being done.  An echocardiogram was performed on 06/28/2021 which showed EF 30%, mild to moderate MR, and normal RV. The patient states that she has not been fully compliant with all her prescription medications.  She has not taken her prescription medicines including her torsemide, Xarelto, and diltiazem since her discharge from [facility].  Even prior to that, patient states that she has not been fully compliant with her medications.  She states that she does not use any cathartics at home.  She denies any hematochezia, melena, hematemesis. In the emergency department, patient was afebrile and hemodynamically stable.  She was noted to have atrial fibrillation with RVR.  Oxygen saturation was 96% on room air.  BMP was unremarkable.  WBC 8.3, hemoglobin 7.8, platelets 265,000.  LFTs were unremarkable.   Assessment/Plan: Atrial fibrillation with RVR -Initially on metoprolol succinate 25 mg bid -digoxin added by cardiology -06/29/21 Echo EF 30%, mild-mod MR -Soft blood pressures limit titration of her  chronotropic medications intially -Personally reviewed EKG--atrial fibrillation, nonspecific T wave change -cardiology consult appreciated -8/14 started IV lopressor as pt not tolerating po>>>transitioning to po metoprolol succinate by cardiology -8/16--digoxin x 1 -Anticoagulated with Xarelto -Heart rate is currently stable   Abnormal CT abdomen/cecal mass -GI consult appreciated--discussed with Dr. Abbey Chatters -06/28/2021 CT abdomen as discussed above -07/05/21 colonoscopy--partially obstructing cecal mass -general surgery consult appreciated -R-hemicolectomy 07/06/21--Dr. Arnoldo Morale -Postoperatively, patient developed significant nausea and vomiting and postop ileus -Treated with MiraLAX and Dulcolax suppositories -Now starting to have bowel movements -She appears to be tolerating a diet without vomiting   Situational Anxiety -started in IV ativan by Dr. Arnoldo Morale initially -minimize opioids as she became very somnolent with combo on 07/03/21 -d/c ativan as pt is somnolent with morphine and ativan   Iron deficiency anemia -06/29/2021 iron saturation 4, ferritin 5 -dose nulecit x 2 -Hemoglobin mostly stable in the 8 range -We will plan on discharging with oral iron   Morbid obesity -BMI 46.33 -Lifestyle modification   Hypothyroidism -Continue Synthroid   Lymphedema -continue torsemide   Chronic systolic and Diastolic CHF -clinically euvolemic -Continue on home dose of torsemide  Hypokalemia/hypomagnesemia -Replace         Status is: Inpatient   The patient will require care spanning > 2 midnights and should be moved to inpatient because: Inpatient level of care appropriate due to severity of illness   Dispo: The patient is from: Home              Anticipated d/c is to: SNF  Patient currently is medically stable to d/c.              Difficult to place patient No               Family Communication:  no Family at bedside   Consultants:  GI, general  surgery   Code Status:  FULL    DVT Prophylaxis: Xarelto      Procedures: As Listed in Progress Note Above   Antibiotics: None     Subjective: Reports some improvement of loose stools after receiving Imodium.  Tolerating diet.  Objective: Vitals:   07/15/21 2058 07/16/21 0535 07/16/21 0840 07/16/21 1316  BP: (!) 114/56 105/63 96/63 117/63  Pulse: 86 89  85  Resp: 17   18  Temp: (!) 97.5 F (36.4 C) 97.9 F (36.6 C)  98.1 F (36.7 C)  TempSrc: Oral Oral  Oral  SpO2: 97% 96%  96%  Weight:      Height:        Intake/Output Summary (Last 24 hours) at 07/16/2021 1513 Last data filed at 07/16/2021 [id number] Gross per 24 hour  Intake 480 ml  Output --  Net 480 ml   Weight change:  Exam:  General exam: Alert, awake, oriented x 3 Respiratory system: Clear to auscultation. Respiratory effort normal. Cardiovascular system:RRR. No murmurs, rubs, gallops. Gastrointestinal system: Abdomen is nondistended, soft and nontender. No organomegaly or masses felt. Normal bowel sounds heard. Incision healing well Central nervous system: Alert and oriented. No focal neurological deficits. Extremities: bilateral LE lymph edema Skin: No rashes, lesions or ulcers Psychiatry: Judgement and insight appear normal. Mood & affect appropriate.     Data Reviewed: I have personally reviewed following labs and imaging studies Basic Metabolic Panel: Recent Labs  Lab 07/10/21 0453 07/11/21 0454 07/13/21 0611 07/14/21 0500 07/14/21 0621 07/15/21 0616  NA 137 134* 133* 133*  --  133*  K 3.4* 3.5 3.1* 3.3*  --  3.4*  CL 101 103 99 98  --  100  CO2 '28 25 27 27  '$ --  26  GLUCOSE 80 83 101* 76  --  82  BUN 7* '9 10 14  '$ --  14  CREATININE 0.69 0.82 0.94 1.10*  --  0.94  CALCIUM 8.2* 7.7* 8.2* 8.2*  --  7.8*  MG 1.8  --  1.6*  --  2.0  --   PHOS 2.8  --  2.8 3.0  --   --    Liver Function Tests: Recent Labs  Lab 07/13/21 0611 07/14/21 0500  ALBUMIN 2.3* 1.9*    No results for input(s):  LIPASE, AMYLASE in the last 168 hours. No results for input(s): AMMONIA in the last 168 hours. Coagulation Profile: No results for input(s): INR, PROTIME in the last 168 hours. CBC: Recent Labs  Lab 07/11/21 0454 07/13/21 0611 07/14/21 0621 07/14/21 1401 07/15/21 0616  WBC 9.2 15.0* 9.8 10.4 10.3  HGB 8.7* 9.2* 7.0* 8.3* 8.6*  HCT 31.4* 32.3* 25.4* 29.1* 30.3*  MCV 74.9* 75.5* 77.9* 76.2* 76.9*  PLT 261 277 184 223 264   Cardiac Enzymes: No results for input(s): CKTOTAL, CKMB, CKMBINDEX, TROPONINI in the last 168 hours. BNP: Invalid input(s): POCBNP CBG: No results for input(s): GLUCAP in the last 168 hours.  HbA1C: No results for input(s): HGBA1C in the last 72 hours. Urine analysis:    Component Value Date/Time   COLORURINE YELLOW 03/26/2014 1346   APPEARANCEUR CLEAR 03/26/2014 1346   LABSPEC 1.011 03/26/2014 1346  PHURINE 5.0 03/26/2014 1346   GLUCOSEU NEGATIVE 03/26/2014 1346   HGBUR MODERATE (A) 03/26/2014 1346   BILIRUBINUR NEGATIVE 03/26/2014 1346   KETONESUR NEGATIVE 03/26/2014 1346   PROTEINUR NEGATIVE 03/26/2014 1346   UROBILINOGEN 0.2 03/26/2014 1346   NITRITE NEGATIVE 03/26/2014 1346   LEUKOCYTESUR NEGATIVE 03/26/2014 1346   Sepsis Labs: '@LABRCNTIP'$ (procalcitonin:4,lacticidven:4) ) Recent Results (from the past 240 hour(s))  C Difficile Quick Screen (NO PCR Reflex)     Status: None   Collection Time: 07/16/21  1:09 AM   Specimen: STOOL  Result Value Ref Range Status   C Diff antigen NEGATIVE NEGATIVE Final   C Diff toxin NEGATIVE NEGATIVE Final   C Diff interpretation No C. difficile detected.  Final    Comment: Performed at [facility], [address] [place],  [address]      Scheduled Meds:  Chlorhexidine Gluconate Cloth  6 each Topical Daily   levothyroxine  100 mcg Oral Q0600   metoprolol succinate  25 mg Oral BID   multivitamin with minerals  1 tablet Oral Daily   pantoprazole  40 mg Oral Q1200   Ensure Max Protein  11 oz Oral  BID   rivaroxaban  20 mg Oral Q supper   sodium chloride flush  10-40 mL Intracatheter Q12H   torsemide  20 mg Oral Daily   Continuous Infusions:  lactated ringers      Procedures/Studies: DG Abd 1 View  Result Date: 07/12/2021 CLINICAL DATA:  Constant EXAM: ABDOMEN - 1 VIEW COMPARISON:  Portable exam 1355 hours compared to 07/03/2021 FINDINGS: Gaseous distension of multiple small bowel loops throughout abdomen. Paucity of colonic gas though small amount gas is seen within the distal sigmoid colon and rectum. Surgical clips identified at the mid abdomen. Findings likely represent postoperative ileus. No bowel wall thickening or urinary tract calcification. Degenerative changes and levoconvex scoliosis lumbar spine. IMPRESSION: Gaseous distention of multiple small bowel loops throughout abdomen favoring postoperative ileus. Electronically Signed   By: Lavonia Dana M.D.   On: 07/12/2021 14:28   DG Abdomen 1 View  Result Date: 07/03/2021 CLINICAL DATA:  Constipation EXAM: ABDOMEN - 1 VIEW COMPARISON:  CT pelvis June 28, 2021 FINDINGS: The bowel gas pattern is normal. Moderate volume of formed stool throughout the colon. Degenerative changes bilateral hips. Lumbar spondylosis. IMPRESSION: Nonobstructive bowel gas pattern. Moderate volume of formed stool throughout the colon. Electronically Signed   By: Dahlia Bailiff MD   On: 07/03/2021 17:45   DG CHEST PORT 1 VIEW  Result Date: 07/12/2021 CLINICAL DATA:  PICC line verification EXAM: PORTABLE CHEST 1 VIEW COMPARISON:  Portable exam 1352 hours compared to 06/28/2021 FINDINGS: RIGHT arm PICC line tip projects over SVC. Minimal enlargement of cardiac silhouette. Mediastinal contours and pulmonary vascularity normal. Subsegmental atelectasis LEFT base. Lungs otherwise clear. Tiny LEFT pleural effusion. No pneumothorax. IMPRESSION: LEFT basilar atelectasis and tiny pleural effusion. RIGHT arm PICC line tip projects over SVC. Electronically Signed   By:  Lavonia Dana M.D.   On: 07/12/2021 14:27   [place] EKG SITE RITE  Result Date: 07/12/2021 If Site Rite image not attached, placement could not be confirmed due to current cardiac rhythm.   Kathie Dike, MD  [facility]  If 7PM-7AM, please contact night-coverage [email or web address]  07/16/2021, 3:13 PM   LOS: 10 days

## 2021-07-16 NOTE — Care Management Important Message (Signed)
Important Message  Patient Details  Name: Lauren [name] MRN: [id number] Date of Birth: 1946/02/10   Medicare Important Message Given:  Yes (RN will deliver letter due to contact precautions)     Tommy Medal 07/16/2021, 1:43 PM

## 2021-07-16 NOTE — TOC Progression Note (Signed)
Transition of Care [facility]) - Progression Note    Patient Details  Name: Lauren [name] MRN: [id number] Date of Birth: 02-20-1946  [facility] CM/SW Contact  Boneta Lucks, RN Phone Number: 07/16/2021, 12:28 PM  Clinical Narrative:   [facility] can not offer on patient, Discussed [facility] bed offer and patient accepted. Added to INS AUTH.  [facility] will have a female bed tomorrow. RN/MD updated.   Expected Discharge Plan: Skilled Nursing Facility Barriers to Discharge: Continued Medical Work up  Expected Discharge Plan and Services Expected Discharge Plan: [place]

## 2021-07-17 MED ORDER — ONDANSETRON 4 MG PO TBDP: 4.0000 mg | ORAL_TABLET | Freq: Four times a day (QID) | ORAL | 0 refills | Status: DC | PRN

## 2021-07-17 MED ORDER — MORPHINE SULFATE (PF) 2 MG/ML IV SOLN
2.0000 mg | Freq: Once | INTRAVENOUS | Status: AC
  Administered 2021-07-17: 2 mg via INTRAVENOUS
  Filled 2021-07-17: qty 1

## 2021-07-17 MED ORDER — RIVAROXABAN 20 MG PO TABS: 20.0000 mg | ORAL_TABLET | Freq: Every day | ORAL | Status: DC

## 2021-07-17 MED ORDER — FERROUS SULFATE 325 (65 FE) MG PO TBEC: 325.0000 mg | DELAYED_RELEASE_TABLET | Freq: Two times a day (BID) | ORAL | 3 refills | Status: AC

## 2021-07-17 MED ORDER — TORSEMIDE 20 MG PO TABS: 20.0000 mg | ORAL_TABLET | Freq: Every day | ORAL | Status: DC

## 2021-07-17 MED ORDER — METOPROLOL SUCCINATE ER 25 MG PO TB24: 25.0000 mg | ORAL_TABLET | Freq: Two times a day (BID) | ORAL | Status: DC

## 2021-07-17 MED ORDER — PANTOPRAZOLE SODIUM 40 MG PO TBEC: 40.0000 mg | DELAYED_RELEASE_TABLET | Freq: Every day | ORAL | Status: DC

## 2021-07-17 MED ORDER — OXYCODONE HCL 5 MG PO TABS: 5.0000 mg | ORAL_TABLET | Freq: Four times a day (QID) | ORAL | 0 refills | Status: DC | PRN

## 2021-07-17 MED ORDER — LOPERAMIDE HCL 2 MG PO CAPS: 2.0000 mg | ORAL_CAPSULE | Freq: Four times a day (QID) | ORAL | 0 refills | Status: DC | PRN

## 2021-07-17 MED ORDER — POLYETHYLENE GLYCOL 3350 17 G PO PACK: 17.0000 g | PACK | Freq: Every day | ORAL | 0 refills | Status: DC | PRN

## 2021-07-17 MED ORDER — OXYCODONE HCL 5 MG PO TABS: 5.0000 mg | ORAL_TABLET | Freq: Four times a day (QID) | ORAL | Status: DC | PRN

## 2021-07-17 NOTE — Progress Notes (Signed)
11 Days Post-Op  Subjective: Events of the last 24 hours noted.  She states her diarrhea seems to be resolving.  Objective: Vital signs in last 24 hours: Temp:  [97.7 F (36.5 C)-98.1 F (36.7 C)] 98.1 F (36.7 C) (08/23 0607) Pulse Rate:  [81-105] 105 (08/23 0824) Resp:  [18-19] 18 (08/23 0607) BP: (105-128)/(54-64) 105/54 (08/23 0824) SpO2:  [96 %-98 %] 97 % (08/23 0824) Last BM Date: 07/16/21  Intake/Output from previous day: 08/22 0701 - 08/23 0700 In: 720 [P.O.:720] Out: -  Intake/Output this shift: No intake/output data recorded.  General appearance: alert and no distress GI: Soft, nontender, nondistended.  Incision well-healing  Lab Results:  Recent Labs    07/14/21 1401 07/15/21 0616  WBC 10.4 10.3  HGB 8.3* 8.6*  HCT 29.1* 30.3*  PLT 223 264   BMET Recent Labs    07/15/21 0616  NA 133*  K 3.4*  CL 100  CO2 26  GLUCOSE 82  BUN 14  CREATININE 0.94  CALCIUM 7.8*   PT/INR No results for input(s): LABPROT, INR in the last 72 hours.  Studies/Results: No results found.  Anti-infectives: Anti-infectives (From admission, onward)    Start     Dose/Rate Route Frequency Ordered Stop   07/06/21 1200  ertapenem (INVANZ) 1,000 mg in sodium chloride 0.9 % 100 mL IVPB        1 g 200 mL/hr over 30 Minutes Intravenous On call to O.R. 07/05/21 1453 07/06/21 1113   07/06/21 0941  sodium chloride 0.9 % with ertapenem [facility]) ADS Med       Note to Pharmacy: Augustin Schooling   : cabinet override      07/06/21 0941 07/06/21 1058   07/05/21 1600  neomycin (MYCIFRADIN) tablet 1,000 mg  Status:  Discontinued        1,000 mg Oral Every 4 hours 07/05/21 1438 07/06/21 0359   07/05/21 1530  metroNIDAZOLE (FLAGYL) IVPB 500 mg        500 mg 100 mL/hr over 60 Minutes Intravenous Every 4 hours 07/05/21 1438 07/06/21 0126       Assessment/Plan: s/p Procedure(s): RIGHT HEMICOLECTOMY Impression: Diarrhea seems to be resolving.  Cultures are negative.  C. difficile  negative.  Awaiting discharge to SNF.  LOS: 11 days    [name] Signs 07/17/2021

## 2021-07-17 NOTE — Discharge Summary (Signed)
Physician Discharge Summary  Lauren [name] [id number] DOB: [birth year] DOA: 07/03/2021  PCP: Pcp, No  Admit date: 07/03/2021 Discharge date: 07/17/2021  Admitted From: home Disposition:  SNF  Recommendations for Outpatient Follow-up:  Follow up with PCP in 1-2 weeks Please obtain BMP/CBC in one week Follow up with general surgery in 2 weeks Follow up with cardiology on 9/19 Follow up with oncology on 07/25/21 at 8:15am  Home Health: Equipment/Devices:  Discharge Condition:stable CODE STATUS:full code Diet recommendation: heart healthy  Brief/Interim Summary: 75 year old female with a history of atrial fibrillation, morbid obesity, diastolic CHF, hypertension, lymphedema presenting with constipation and rectal pain of 2 day duration.  The patient was recently admitted to [facility] from 06/28/2021 to 06/30/2021 for constipation.  During that admission, the patient was noted to have a hemoglobin of 5.6.  She was transfused with 2 units PRBC.  Her discharge hemoglobin was 8.  She had a CT of the abdomen and pelvis which showed cecal thickening raising questions of a possible cecal mass with small adjacent lymph nodes.  There is also skin thickening over her pannus.  The patient was given cathartics and ultimately had a bowel movement.  The patient was scheduled to have colonoscopy performed by general surgery, but she left AGAINST MEDICAL ADVICE prior to this being done.  An echocardiogram was performed on 06/28/2021 which showed EF 30%, mild to moderate MR, and normal RV. The patient states that she has not been fully compliant with all her prescription medications.  She has not taken her prescription medicines including her torsemide, Xarelto, and diltiazem since her discharge from [facility].  Even prior to that, patient states that she has not been fully compliant with her medications.  She states that she does not use any cathartics at home.  She denies any hematochezia, melena, hematemesis. In the  emergency department, patient was afebrile and hemodynamically stable.  She was noted to have atrial fibrillation with RVR.  Oxygen saturation was 96% on room air.  BMP was unremarkable.  WBC 8.3, hemoglobin 7.8, platelets 265,000.  LFTs were unremarkable  Discharge Diagnoses:  Principal Problem:   Atrial fibrillation with RVR (HCC) Active Problems:   Hypothyroidism   Iron deficiency anemia   Chronic acquired lymphedema   Rectal pain   Morbid obesity with BMI of 50.0-59.9, adult (HCC)   Chronic combined systolic and diastolic CHF (congestive heart failure) (HCC)   Constipation   Colonic mass   Colon carcinoma (HCC)  Atrial fibrillation with RVR -Initially on metoprolol succinate 25 mg bid -digoxin added by cardiology -06/29/21 Echo EF 30%, mild-mod MR -Soft blood pressures limit titration of her chronotropic medications intially -Personally reviewed EKG--atrial fibrillation, nonspecific T wave change -cardiology consult appreciated -8/14 started IV lopressor as pt not tolerating po>>>transitioning to po metoprolol succinate by cardiology -8/16--digoxin x 1 -Anticoagulated with Xarelto -Heart rate is currently stable   Abnormal CT abdomen/cecal mass -GI consult appreciated--discussed with Dr. Abbey Chatters -06/28/2021 CT abdomen as discussed above -07/05/21 colonoscopy--partially obstructing cecal mass -general surgery consult appreciated -R-hemicolectomy 07/06/21--Dr. Arnoldo Morale -Postoperatively, patient developed significant nausea and vomiting and postop ileus -Treated with MiraLAX and Dulcolax suppositories -Now starting to have bowel movements -She appears to be tolerating a diet without vomiting   Situational Anxiety -started in IV ativan by Dr. Arnoldo Morale initially -minimize opioids as she became very somnolent with combo on 07/03/21 -d/c ativan as pt is somnolent with morphine and ativan   Iron deficiency anemia -06/29/2021 iron saturation 4, ferritin 5 -dose nulecit x 2 -  Hemoglobin  mostly stable in the 8 range -We will plan on discharging with oral iron   Morbid obesity -BMI 46.33 -Lifestyle modification   Hypothyroidism -Continue Synthroid   Lymphedema -continue torsemide   Chronic systolic and Diastolic CHF -clinically euvolemic -Continue on home dose of torsemide   Hypokalemia/hypomagnesemia -Replaced  Discharge Instructions  Discharge Instructions     Diet - low sodium heart healthy   Complete by: As directed    Discharge wound care:   Complete by: As directed    Keep incision dry and clean   Increase activity slowly   Complete by: As directed       Allergies as of 07/17/2021       Reactions   Codeine Itching   Compazine [prochlorperazine Edisylate]    Prednisone Nausea And Vomiting   Tetanus Toxoids Other (See Comments)   Knot like "hen egg" came up    Vicodin [hydrocodone-acetaminophen] Itching        Medication List     STOP taking these medications    diltiazem 30 MG tablet Commonly known as: CARDIZEM   magnesium citrate Soln       TAKE these medications    acetaminophen 500 MG tablet Commonly known as: TYLENOL Take 1,000 mg by mouth every 6 (six) hours as needed.   ferrous sulfate 325 (65 FE) MG EC tablet Take 1 tablet (325 mg total) by mouth 2 (two) times daily.   levothyroxine 100 MCG tablet Commonly known as: SYNTHROID Take 1 tablet (100 mcg total) by mouth daily. What changed: Another medication with the same name was removed. Continue taking this medication, and follow the directions you see here.   loperamide 2 MG capsule Commonly known as: IMODIUM Take 1 capsule (2 mg total) by mouth every 6 (six) hours as needed for diarrhea or loose stools.   metoprolol succinate 25 MG 24 hr tablet Commonly known as: TOPROL-XL Take 1 tablet (25 mg total) by mouth 2 (two) times daily.   ondansetron 4 MG disintegrating tablet Commonly known as: ZOFRAN-ODT Take 1 tablet (4 mg total) by mouth every 6 (six) hours as  needed for nausea.   oxyCODONE 5 MG immediate release tablet Commonly known as: Oxy IR/ROXICODONE Take 1 tablet (5 mg total) by mouth every 6 (six) hours as needed for moderate pain.   pantoprazole 40 MG tablet Commonly known as: PROTONIX Take 1 tablet (40 mg total) by mouth daily at 12 noon. Start taking on: July 18, 2021   polyethylene glycol 17 g packet Commonly known as: MIRALAX / GLYCOLAX Take 17 g by mouth daily as needed for mild constipation.   potassium chloride SA 20 MEQ tablet Commonly known as: KLOR-CON TAKE ONE (1) TABLET EACH DAY   rivaroxaban 20 MG Tabs tablet Commonly known as: Xarelto Take 1 tablet (20 mg total) by mouth daily with supper. What changed: when to take this   torsemide 20 MG tablet Commonly known as: DEMADEX Take 1 tablet (20 mg total) by mouth daily.               Discharge Care Instructions  (From admission, onward)           Start     Ordered   07/17/21 0000  Discharge wound care:       Comments: Keep incision dry and clean   07/17/21 1320            Contact information for follow-up providers     Verta Ellen.,  NP Follow up on 08/13/2021.   Specialty: Cardiology [facility] Follow-up on 08/13/2021 at 1:30 PM with Katina Dung, NP (works with Dr. Harl Bowie) Contact information: [place] [address] [phone]              Contact information for after-discharge care     [facility] .   Service: Skilled Nursing Contact information: [place] [address] [phone]                    Allergies  Allergen Reactions   Codeine Itching   Compazine [Prochlorperazine Edisylate]    Prednisone Nausea And Vomiting   Tetanus Toxoids Other (See Comments)    Knot like "hen egg" came up    Vicodin [Hydrocodone-Acetaminophen] Itching    Consultations: General  surgery Cardiology GI   Procedures/Studies: DG Abd 1 View  Result Date: 07/12/2021 CLINICAL DATA:  Constant EXAM: ABDOMEN - 1 VIEW COMPARISON:  Portable exam 1355 hours compared to 07/03/2021 FINDINGS: Gaseous distension of multiple small bowel loops throughout abdomen. Paucity of colonic gas though small amount gas is seen within the distal sigmoid colon and rectum. Surgical clips identified at the mid abdomen. Findings likely represent postoperative ileus. No bowel wall thickening or urinary tract calcification. Degenerative changes and levoconvex scoliosis lumbar spine. IMPRESSION: Gaseous distention of multiple small bowel loops throughout abdomen favoring postoperative ileus. Electronically Signed   By: Lavonia Dana M.D.   On: 07/12/2021 14:28   DG Abdomen 1 View  Result Date: 07/03/2021 CLINICAL DATA:  Constipation EXAM: ABDOMEN - 1 VIEW COMPARISON:  CT pelvis June 28, 2021 FINDINGS: The bowel gas pattern is normal. Moderate volume of formed stool throughout the colon. Degenerative changes bilateral hips. Lumbar spondylosis. IMPRESSION: Nonobstructive bowel gas pattern. Moderate volume of formed stool throughout the colon. Electronically Signed   By: Dahlia Bailiff MD   On: 07/03/2021 17:45   DG CHEST PORT 1 VIEW  Result Date: 07/12/2021 CLINICAL DATA:  PICC line verification EXAM: PORTABLE CHEST 1 VIEW COMPARISON:  Portable exam 1352 hours compared to 06/28/2021 FINDINGS: RIGHT arm PICC line tip projects over SVC. Minimal enlargement of cardiac silhouette. Mediastinal contours and pulmonary vascularity normal. Subsegmental atelectasis LEFT base. Lungs otherwise clear. Tiny LEFT pleural effusion. No pneumothorax. IMPRESSION: LEFT basilar atelectasis and tiny pleural effusion. RIGHT arm PICC line tip projects over SVC. Electronically Signed   By: Lavonia Dana M.D.   On: 07/12/2021 14:27   [place] EKG SITE RITE  Result Date: 07/12/2021 If Site Rite image not attached, placement could not be  confirmed due to current cardiac rhythm.     Subjective: Diarrhea is better. Having some abdominal discomfort today. Having some headache today  Discharge Exam: Vitals:   07/16/21 2203 07/17/21 0607 07/17/21 0824 07/17/21 1315  BP: (!) 110/56 (!) 122/55 (!) 105/54 (!) 102/58  Pulse: 85 81 (!) 105 95  Resp: '19 18  20  '$ Temp: 97.7 F (36.5 C) 98.1 F (36.7 C)  (!) 97.5 F (36.4 C)  TempSrc: Oral Oral  Oral  SpO2: 97% 98% 97% 97%  Weight:      Height:        General: Pt is alert, awake, not in acute distress Cardiovascular: RRR, S1/S2 +, no rubs, no gallops Respiratory: CTA bilaterally, no wheezing, no rhonchi Abdominal: Soft, NT, ND, bowel sounds + Extremities: bilateral lymph edema, no cyanosis    The results  of significant diagnostics from this hospitalization (including imaging, microbiology, ancillary and laboratory) are listed below for reference.     Microbiology: Recent Results (from the past 240 hour(s))  C Difficile Quick Screen (NO PCR Reflex)     Status: None   Collection Time: 07/16/21  1:09 AM   Specimen: STOOL  Result Value Ref Range Status   C Diff antigen NEGATIVE NEGATIVE Final   C Diff toxin NEGATIVE NEGATIVE Final   C Diff interpretation No C. difficile detected.  Final    Comment: Performed at [facility], [address] [place], [place] [address]     Labs: BNP (last 3 results) No results for input(s): BNP in the last 8760 hours. Basic Metabolic Panel: Recent Labs  Lab 07/11/21 0454 07/13/21 0611 07/14/21 0500 07/14/21 0621 07/15/21 0616  NA 134* 133* 133*  --  133*  K 3.5 3.1* 3.3*  --  3.4*  CL 103 99 98  --  100  CO2 '25 27 27  '$ --  26  GLUCOSE 83 101* 76  --  82  BUN '9 10 14  '$ --  14  CREATININE 0.82 0.94 1.10*  --  0.94  CALCIUM 7.7* 8.2* 8.2*  --  7.8*  MG  --  1.6*  --  2.0  --   PHOS  --  2.8 3.0  --   --    Liver Function Tests: Recent Labs  Lab 07/13/21 0611 07/14/21 0500  ALBUMIN 2.3* 1.9*   No results for input(s):  LIPASE, AMYLASE in the last 168 hours. No results for input(s): AMMONIA in the last 168 hours. CBC: Recent Labs  Lab 07/11/21 0454 07/13/21 0611 07/14/21 0621 07/14/21 1401 07/15/21 0616  WBC 9.2 15.0* 9.8 10.4 10.3  HGB 8.7* 9.2* 7.0* 8.3* 8.6*  HCT 31.4* 32.3* 25.4* 29.1* 30.3*  MCV 74.9* 75.5* 77.9* 76.2* 76.9*  PLT 261 277 184 223 264   Cardiac Enzymes: No results for input(s): CKTOTAL, CKMB, CKMBINDEX, TROPONINI in the last 168 hours. BNP: Invalid input(s): POCBNP CBG: No results for input(s): GLUCAP in the last 168 hours. D-Dimer No results for input(s): DDIMER in the last 72 hours. Hgb A1c No results for input(s): HGBA1C in the last 72 hours. Lipid Profile No results for input(s): CHOL, HDL, LDLCALC, TRIG, CHOLHDL, LDLDIRECT in the last 72 hours. Thyroid function studies No results for input(s): TSH, T4TOTAL, T3FREE, THYROIDAB in the last 72 hours.  Invalid input(s): FREET3 Anemia work up No results for input(s): VITAMINB12, FOLATE, FERRITIN, TIBC, IRON, RETICCTPCT in the last 72 hours. Urinalysis    Component Value Date/Time   COLORURINE YELLOW 03/26/2014 1346   APPEARANCEUR CLEAR 03/26/2014 1346   LABSPEC 1.011 03/26/2014 1346   PHURINE 5.0 03/26/2014 1346   GLUCOSEU NEGATIVE 03/26/2014 1346   HGBUR MODERATE (A) 03/26/2014 1346   BILIRUBINUR NEGATIVE 03/26/2014 1346   KETONESUR NEGATIVE 03/26/2014 1346   PROTEINUR NEGATIVE 03/26/2014 1346   UROBILINOGEN 0.2 03/26/2014 1346   NITRITE NEGATIVE 03/26/2014 1346   LEUKOCYTESUR NEGATIVE 03/26/2014 1346   Sepsis Labs Invalid input(s): PROCALCITONIN,  WBC,  LACTICIDVEN Microbiology Recent Results (from the past 240 hour(s))  C Difficile Quick Screen (NO PCR Reflex)     Status: None   Collection Time: 07/16/21  1:09 AM   Specimen: STOOL  Result Value Ref Range Status   C Diff antigen NEGATIVE NEGATIVE Final   C Diff toxin NEGATIVE NEGATIVE Final   C Diff interpretation No C. difficile detected.  Final  Comment: Performed at [facility], [address] [place], [place] [address]     Time coordinating discharge: 70mns  SIGNED:   JKathie Dike MD  [facility] 07/17/2021, 2:44 PM   If 7PM-7AM, please contact night-coverage [email or web address]

## 2021-07-17 NOTE — TOC Transition Note (Signed)
Transition of Care [facility]) - CM/SW Discharge Note   Patient Details  Name: Lauren [name] MRN: [id number] Date of Birth: 1946-05-27  [facility] CM/SW Contact:  Boneta Lucks, RN Phone Number: 07/17/2021, 2:23 PM   Clinical Narrative:   Patient is medically ready to discharge to [facility]. Insurance auth received. Debbie provided bed #B19-2. RN to call report. EMS will be scheduled when RN is ready. DC summary sent in the hub.   Final next level of care: Skilled Nursing Facility Barriers to Discharge: Barriers Resolved   Patient Goals and CMS Choice Patient states their goals for this hospitalization and ongoing recovery are:: to get better CMS Medicare.gov Compare Post Acute Care list provided to:: Patient Choice offered to / list presented to : Patient  Discharge Placement              Patient chooses bed at:  [facility]) Patient to be transferred to facility by: EMS   Patient and family notified of of transfer: 07/17/21  Discharge Plan and Services      [place]   Readmission Risk Interventions Readmission Risk Prevention Plan 07/12/2021  Transportation Screening Complete  Home Care Screening Complete  Medication Review (RN CM) Complete  Some recent data might be hidden

## 2021-07-17 NOTE — Progress Notes (Addendum)
Physical Therapy Treatment Patient Details Name: Lauren [name] MRN: [id number] DOB: 07-05-1946 Today's Date: 07/17/2021    History of Present Illness 75 yo female with c/o constipation and rectal pain. 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 Pt s/p R hemicolectomy 07/06/21. PMH: a fib, morbid obesity, diastolic CHF, hypertension, lymphedema    PT Comments    Pt declines mobilizing OOB, states she didn't sleep last night and has a stomach ache today with nausea. Attempted to educate pt on benefit of mobility and therapeutic process with guarded carryover. Pt tolerates BLE strengthening exercises in supine, denies worsening stomach ache/nausea. Continue to progress as able.   Follow Up Recommendations  SNF     Equipment Recommendations  None recommended by PT    Recommendations for Other Services       Precautions / Restrictions Precautions Precautions: Fall Restrictions Weight Bearing Restrictions: No    Mobility  Bed Mobility   Transfers   Ambulation/Gait   Stairs             Wheelchair Mobility    Modified Rankin (Stroke Patients Only)       Balance     Cognition Arousal/Alertness: Awake/alert Behavior During Therapy: WFL for tasks assessed/performed Overall Cognitive Status: Within Functional Limits for tasks assessed     Exercises General Exercises - Lower Extremity Ankle Circles/Pumps: Supine;AROM;Both;15 reps Short Arc Quad: Supine;AROM;Strengthening;Both;15 reps Heel Slides: Supine;AROM;Strengthening;Both;10 reps    General Comments        Pertinent Vitals/Pain Pain Assessment: Faces Faces Pain Scale: Hurts a little bit Pain Location: stomach Pain Descriptors / Indicators: Aching Pain Intervention(s): Limited activity within patient's tolerance;Monitored during session    Home Living                      Prior Function            PT Goals (current goals can now be found in the  care plan section) Acute Rehab PT Goals Patient Stated Goal: "get a roommate to assist pt at home" PT Goal Formulation: With patient Time For Goal Achievement: 07/25/21 Potential to Achieve Goals: Good Progress towards PT goals: Progressing toward goals (limited by stomachache/nausea)    Frequency    Min 3X/week      PT Plan Current plan remains appropriate    Co-evaluation              AM-PAC PT "6 Clicks" Mobility   Outcome Measure  Help needed turning from your back to your side while in a flat bed without using bedrails?: A Lot Help needed moving from lying on your back to sitting on the side of a flat bed without using bedrails?: A Lot Help needed moving to and from a bed to a chair (including a wheelchair)?: A Lot Help needed standing up from a chair using your arms (e.g., wheelchair or bedside chair)?: A Lot Help needed to walk in hospital room?: A Lot Help needed climbing 3-5 steps with a railing? : A Lot 6 Click Score: 12    End of Session   Activity Tolerance: Patient limited by fatigue Patient left: in bed;with call bell/phone within reach;with bed alarm set Nurse Communication: Mobility status PT Visit Diagnosis: Other abnormalities of gait and mobility (R26.89);Muscle weakness (generalized) (M62.81)     Time: [id number] PT Time Calculation (min) (ACUTE ONLY): 13 min  Charges:  $Therapeutic Exercise: 8-22 mins  Talbot Grumbling PT, DPT 07/17/21, 12:22 PM

## 2021-07-18 ENCOUNTER — Encounter (HOSPITAL_COMMUNITY): Payer: Self-pay | Admitting: Emergency Medicine

## 2021-07-18 ENCOUNTER — Emergency Department (HOSPITAL_COMMUNITY): Payer: Medicare HMO

## 2021-07-18 ENCOUNTER — Inpatient Hospital Stay (HOSPITAL_COMMUNITY): Payer: Medicare HMO

## 2021-07-18 ENCOUNTER — Inpatient Hospital Stay: Payer: Self-pay

## 2021-07-18 ENCOUNTER — Emergency Department: Payer: Self-pay

## 2021-07-18 ENCOUNTER — Inpatient Hospital Stay (HOSPITAL_COMMUNITY)
Admission: EM | Admit: 2021-07-18 | Discharge: 2021-09-07 | DRG: 871 | Disposition: A | Discharge status: Skilled Nursing Facility | Payer: Medicare HMO | Source: Skilled Nursing Facility | Attending: Family Medicine | Admitting: Family Medicine

## 2021-07-18 ENCOUNTER — Other Ambulatory Visit: Payer: Self-pay

## 2021-07-18 DIAGNOSIS — I4891 Unspecified atrial fibrillation: Secondary | ICD-10-CM | POA: Diagnosis not present

## 2021-07-18 DIAGNOSIS — R109 Unspecified abdominal pain: Secondary | ICD-10-CM | POA: Diagnosis not present

## 2021-07-18 DIAGNOSIS — F0394 Unspecified dementia, unspecified severity, with anxiety: Secondary | ICD-10-CM | POA: Diagnosis present

## 2021-07-18 DIAGNOSIS — A4189 Other specified sepsis: Principal | ICD-10-CM | POA: Diagnosis present

## 2021-07-18 DIAGNOSIS — A419 Sepsis, unspecified organism: Secondary | ICD-10-CM | POA: Diagnosis present

## 2021-07-18 DIAGNOSIS — C189 Malignant neoplasm of colon, unspecified: Secondary | ICD-10-CM | POA: Diagnosis present

## 2021-07-18 DIAGNOSIS — Z7901 Long term (current) use of anticoagulants: Secondary | ICD-10-CM

## 2021-07-18 DIAGNOSIS — L89159 Pressure ulcer of sacral region, unspecified stage: Secondary | ICD-10-CM | POA: Diagnosis present

## 2021-07-18 DIAGNOSIS — K5730 Diverticulosis of large intestine without perforation or abscess without bleeding: Secondary | ICD-10-CM | POA: Diagnosis present

## 2021-07-18 DIAGNOSIS — U071 COVID-19: Secondary | ICD-10-CM | POA: Diagnosis not present

## 2021-07-18 DIAGNOSIS — E8809 Other disorders of plasma-protein metabolism, not elsewhere classified: Secondary | ICD-10-CM | POA: Diagnosis present

## 2021-07-18 DIAGNOSIS — E876 Hypokalemia: Secondary | ICD-10-CM | POA: Diagnosis not present

## 2021-07-18 DIAGNOSIS — Z6841 Body Mass Index (BMI) 40.0 and over, adult: Secondary | ICD-10-CM | POA: Diagnosis not present

## 2021-07-18 DIAGNOSIS — K625 Hemorrhage of anus and rectum: Secondary | ICD-10-CM | POA: Diagnosis not present

## 2021-07-18 DIAGNOSIS — I4821 Permanent atrial fibrillation: Secondary | ICD-10-CM | POA: Diagnosis present

## 2021-07-18 DIAGNOSIS — E871 Hypo-osmolality and hyponatremia: Secondary | ICD-10-CM | POA: Diagnosis not present

## 2021-07-18 DIAGNOSIS — D62 Acute posthemorrhagic anemia: Secondary | ICD-10-CM | POA: Diagnosis present

## 2021-07-18 DIAGNOSIS — R6521 Severe sepsis with septic shock: Secondary | ICD-10-CM | POA: Diagnosis present

## 2021-07-18 DIAGNOSIS — D638 Anemia in other chronic diseases classified elsewhere: Secondary | ICD-10-CM | POA: Diagnosis present

## 2021-07-18 DIAGNOSIS — D509 Iron deficiency anemia, unspecified: Secondary | ICD-10-CM | POA: Diagnosis not present

## 2021-07-18 DIAGNOSIS — K420 Umbilical hernia with obstruction, without gangrene: Secondary | ICD-10-CM | POA: Diagnosis present

## 2021-07-18 DIAGNOSIS — G4700 Insomnia, unspecified: Secondary | ICD-10-CM | POA: Diagnosis present

## 2021-07-18 DIAGNOSIS — I429 Cardiomyopathy, unspecified: Secondary | ICD-10-CM | POA: Diagnosis present

## 2021-07-18 DIAGNOSIS — E039 Hypothyroidism, unspecified: Secondary | ICD-10-CM | POA: Diagnosis present

## 2021-07-18 DIAGNOSIS — R5381 Other malaise: Secondary | ICD-10-CM | POA: Diagnosis not present

## 2021-07-18 DIAGNOSIS — R0602 Shortness of breath: Secondary | ICD-10-CM

## 2021-07-18 DIAGNOSIS — I890 Lymphedema, not elsewhere classified: Secondary | ICD-10-CM

## 2021-07-18 DIAGNOSIS — R1084 Generalized abdominal pain: Secondary | ICD-10-CM | POA: Diagnosis not present

## 2021-07-18 DIAGNOSIS — K632 Fistula of intestine: Secondary | ICD-10-CM | POA: Diagnosis present

## 2021-07-18 DIAGNOSIS — Z95828 Presence of other vascular implants and grafts: Secondary | ICD-10-CM

## 2021-07-18 DIAGNOSIS — I5022 Chronic systolic (congestive) heart failure: Secondary | ICD-10-CM | POA: Diagnosis not present

## 2021-07-18 DIAGNOSIS — I110 Hypertensive heart disease with heart failure: Secondary | ICD-10-CM | POA: Diagnosis present

## 2021-07-18 DIAGNOSIS — F03918 Unspecified dementia, unspecified severity, with other behavioral disturbance: Secondary | ICD-10-CM | POA: Diagnosis present

## 2021-07-18 DIAGNOSIS — E872 Acidosis, unspecified: Secondary | ICD-10-CM | POA: Diagnosis present

## 2021-07-18 DIAGNOSIS — F418 Other specified anxiety disorders: Secondary | ICD-10-CM | POA: Diagnosis present

## 2021-07-18 DIAGNOSIS — I5043 Acute on chronic combined systolic (congestive) and diastolic (congestive) heart failure: Secondary | ICD-10-CM | POA: Diagnosis present

## 2021-07-18 DIAGNOSIS — L89899 Pressure ulcer of other site, unspecified stage: Secondary | ICD-10-CM | POA: Diagnosis present

## 2021-07-18 DIAGNOSIS — N179 Acute kidney failure, unspecified: Secondary | ICD-10-CM | POA: Diagnosis not present

## 2021-07-18 DIAGNOSIS — Z66 Do not resuscitate: Secondary | ICD-10-CM | POA: Diagnosis not present

## 2021-07-18 DIAGNOSIS — E43 Unspecified severe protein-calorie malnutrition: Secondary | ICD-10-CM | POA: Diagnosis present

## 2021-07-18 DIAGNOSIS — Z85038 Personal history of other malignant neoplasm of large intestine: Secondary | ICD-10-CM

## 2021-07-18 DIAGNOSIS — G9341 Metabolic encephalopathy: Secondary | ICD-10-CM | POA: Diagnosis not present

## 2021-07-18 DIAGNOSIS — S301XXA Contusion of abdominal wall, initial encounter: Secondary | ICD-10-CM | POA: Diagnosis present

## 2021-07-18 DIAGNOSIS — Z452 Encounter for adjustment and management of vascular access device: Secondary | ICD-10-CM

## 2021-07-18 DIAGNOSIS — F0393 Unspecified dementia, unspecified severity, with mood disturbance: Secondary | ICD-10-CM | POA: Diagnosis present

## 2021-07-18 DIAGNOSIS — Z7189 Other specified counseling: Secondary | ICD-10-CM | POA: Diagnosis not present

## 2021-07-18 DIAGNOSIS — I340 Nonrheumatic mitral (valve) insufficiency: Secondary | ICD-10-CM | POA: Diagnosis present

## 2021-07-18 DIAGNOSIS — F05 Delirium due to known physiological condition: Secondary | ICD-10-CM | POA: Diagnosis present

## 2021-07-18 DIAGNOSIS — S7012XA Contusion of left thigh, initial encounter: Secondary | ICD-10-CM | POA: Diagnosis not present

## 2021-07-18 DIAGNOSIS — R410 Disorientation, unspecified: Secondary | ICD-10-CM | POA: Diagnosis not present

## 2021-07-18 DIAGNOSIS — L0390 Cellulitis, unspecified: Secondary | ICD-10-CM | POA: Diagnosis present

## 2021-07-18 DIAGNOSIS — I5021 Acute systolic (congestive) heart failure: Secondary | ICD-10-CM | POA: Diagnosis not present

## 2021-07-18 DIAGNOSIS — E778 Other disorders of glycoprotein metabolism: Secondary | ICD-10-CM | POA: Diagnosis present

## 2021-07-18 DIAGNOSIS — L03311 Cellulitis of abdominal wall: Secondary | ICD-10-CM

## 2021-07-18 DIAGNOSIS — E162 Hypoglycemia, unspecified: Secondary | ICD-10-CM | POA: Diagnosis not present

## 2021-07-18 DIAGNOSIS — Z515 Encounter for palliative care: Secondary | ICD-10-CM | POA: Diagnosis not present

## 2021-07-18 DIAGNOSIS — K219 Gastro-esophageal reflux disease without esophagitis: Secondary | ICD-10-CM | POA: Diagnosis present

## 2021-07-18 DIAGNOSIS — D6489 Other specified anemias: Secondary | ICD-10-CM | POA: Diagnosis present

## 2021-07-18 HISTORY — DX: Morbid (severe) obesity due to excess calories: E66.01

## 2021-07-18 HISTORY — DX: Permanent atrial fibrillation: I48.21

## 2021-07-18 HISTORY — DX: Lymphedema, not elsewhere classified: I89.0

## 2021-07-18 HISTORY — DX: Chronic combined systolic (congestive) and diastolic (congestive) heart failure: I50.42

## 2021-07-18 HISTORY — DX: Malignant neoplasm of colon, unspecified: C18.9

## 2021-07-18 HISTORY — DX: Hypotension, unspecified: I95.9

## 2021-07-18 LAB — COMPREHENSIVE METABOLIC PANEL
ALT: 17 U/L (ref 0–44)
AST: 28 U/L (ref 15–41)
Albumin: 2 g/dL — ABNORMAL LOW (ref 3.5–5.0)
Alkaline Phosphatase: 102 U/L (ref 38–126)
Anion gap: 11 (ref 5–15)
BUN: 17 mg/dL (ref 8–23)
CO2: 24 mmol/L (ref 22–32)
Calcium: 7.2 mg/dL — ABNORMAL LOW (ref 8.9–10.3)
Chloride: 94 mmol/L — ABNORMAL LOW (ref 98–111)
Creatinine, Ser: 1.58 mg/dL — ABNORMAL HIGH (ref 0.44–1.00)
GFR, Estimated: 35 mL/min — ABNORMAL LOW (ref >60)
Glucose, Bld: 83 mg/dL (ref 70–99)
Potassium: 2.8 mmol/L — ABNORMAL LOW (ref 3.5–5.1)
Sodium: 129 mmol/L — ABNORMAL LOW (ref 135–145)
Total Bilirubin: 1.7 mg/dL — ABNORMAL HIGH (ref 0.3–1.2)
Total Protein: 5.4 g/dL — ABNORMAL LOW (ref 6.5–8.1)

## 2021-07-18 LAB — CBC WITH DIFFERENTIAL/PLATELET
Abs Immature Granulocytes: 1.11 10*3/uL — ABNORMAL HIGH (ref 0.00–0.07)
Basophils Absolute: 0.2 10*3/uL — ABNORMAL HIGH (ref 0.0–0.1)
Basophils Relative: 1 %
Eosinophils Absolute: 0.1 10*3/uL (ref 0.0–0.5)
Eosinophils Relative: 0 %
HCT: 34.2 % — ABNORMAL LOW (ref 36.0–46.0)
Hemoglobin: 9.8 g/dL — ABNORMAL LOW (ref 12.0–15.0)
Immature Granulocytes: 3 %
Lymphocytes Relative: 4 %
Lymphs Abs: 1.4 10*3/uL (ref 0.7–4.0)
MCH: 22.3 pg — ABNORMAL LOW (ref 26.0–34.0)
MCHC: 28.7 g/dL — ABNORMAL LOW (ref 30.0–36.0)
MCV: 77.9 fL — ABNORMAL LOW (ref 80.0–100.0)
Monocytes Absolute: 1.6 10*3/uL — ABNORMAL HIGH (ref 0.1–1.0)
Monocytes Relative: 5 %
Neutro Abs: 28.8 10*3/uL — ABNORMAL HIGH (ref 1.7–7.7)
Neutrophils Relative %: 87 %
Platelets: 318 10*3/uL (ref 150–400)
RBC: 4.39 MIL/uL (ref 3.87–5.11)
RDW: 28.7 % — ABNORMAL HIGH (ref 11.5–15.5)
WBC: 33.1 10*3/uL — ABNORMAL HIGH (ref 4.0–10.5)
nRBC: 0 % (ref 0.0–0.2)

## 2021-07-18 LAB — RESP PANEL BY RT-PCR (FLU A&B, COVID) ARPGX2
Influenza A by PCR: NEGATIVE
Influenza B by PCR: NEGATIVE
SARS Coronavirus 2 by RT PCR: POSITIVE — AB

## 2021-07-18 LAB — TYPE AND SCREEN
ABO/RH(D): O POS
Antibody Screen: NEGATIVE

## 2021-07-18 LAB — GLUCOSE, CAPILLARY: Glucose-Capillary: 77 mg/dL (ref 70–99)

## 2021-07-18 LAB — PROTIME-INR
INR: 2.9 — ABNORMAL HIGH (ref 0.8–1.2)
Prothrombin Time: 30.6 seconds — ABNORMAL HIGH (ref 11.4–15.2)

## 2021-07-18 LAB — LACTIC ACID, PLASMA
Lactic Acid, Venous: 4.1 mmol/L (ref 0.5–1.9)
Lactic Acid, Venous: 2.3 mmol/L (ref 0.5–1.9)

## 2021-07-18 LAB — MRSA NEXT GEN BY PCR, NASAL: MRSA by PCR Next Gen: NOT DETECTED

## 2021-07-18 LAB — PROCALCITONIN: Procalcitonin: 1.06 ng/mL

## 2021-07-18 LAB — APTT: aPTT: 33 seconds (ref 24–36)

## 2021-07-18 LAB — POC OCCULT BLOOD, ED: Fecal Occult Bld: POSITIVE — AB

## 2021-07-18 MED ORDER — SODIUM CHLORIDE 0.9 % IV SOLN: 2.0000 g | Freq: Two times a day (BID) | INTRAVENOUS | Status: DC

## 2021-07-18 MED ORDER — SODIUM CHLORIDE 0.9 % IV BOLUS
500.0000 mL | Freq: Once | INTRAVENOUS | Status: AC
  Administered 2021-07-18: 500 mL via INTRAVENOUS

## 2021-07-18 MED ORDER — METRONIDAZOLE 500 MG/100ML IV SOLN: 500.0000 mg | Freq: Two times a day (BID) | INTRAVENOUS | Status: DC

## 2021-07-18 MED ORDER — CHLORHEXIDINE GLUCONATE CLOTH 2 % EX PADS
6.0000 | MEDICATED_PAD | Freq: Every day | CUTANEOUS | Status: DC
  Administered 2021-07-18 – 2021-09-07 (×50): 6 via TOPICAL

## 2021-07-18 MED ORDER — SODIUM CHLORIDE 0.9 % IV BOLUS
1000.0000 mL | Freq: Once | INTRAVENOUS | Status: AC
  Administered 2021-07-18: 1000 mL via INTRAVENOUS

## 2021-07-18 MED ORDER — LACTATED RINGERS IV SOLN: INTRAVENOUS | Status: DC

## 2021-07-18 MED ORDER — SODIUM CHLORIDE 0.9 % IV SOLN
100.0000 mg | INTRAVENOUS | Status: DC
  Filled 2021-07-18: qty 100

## 2021-07-18 MED ORDER — POTASSIUM CHLORIDE 10 MEQ/100ML IV SOLN
10.0000 meq | Freq: Once | INTRAVENOUS | Status: AC
  Administered 2021-07-18: 10 meq via INTRAVENOUS
  Filled 2021-07-18: qty 100

## 2021-07-18 MED ORDER — SODIUM CHLORIDE 0.9 % IV SOLN
2.0000 g | Freq: Two times a day (BID) | INTRAVENOUS | Status: DC
  Administered 2021-07-19: 2 g via INTRAVENOUS
  Filled 2021-07-18 (×3): qty 2

## 2021-07-18 MED ORDER — SODIUM CHLORIDE 0.9 % IV SOLN
2.0000 g | Freq: Once | INTRAVENOUS | Status: AC
  Administered 2021-07-18: 2 g via INTRAVENOUS
  Filled 2021-07-18: qty 2

## 2021-07-18 MED ORDER — SODIUM CHLORIDE 0.9 % IV SOLN
200.0000 mg | Freq: Once | INTRAVENOUS | Status: AC
  Administered 2021-07-19: 200 mg via INTRAVENOUS
  Filled 2021-07-18: qty 200

## 2021-07-18 MED ORDER — METRONIDAZOLE 500 MG/100ML IV SOLN
500.0000 mg | Freq: Once | INTRAVENOUS | Status: DC
  Administered 2021-07-18: 500 mg via INTRAVENOUS
  Filled 2021-07-18: qty 100

## 2021-07-18 MED ORDER — POTASSIUM CHLORIDE 10 MEQ/100ML IV SOLN
10.0000 meq | INTRAVENOUS | Status: AC
Start: 2021-07-18 — End: 2021-07-19
  Administered 2021-07-18 – 2021-07-19 (×3): 10 meq via INTRAVENOUS
  Filled 2021-07-18 (×2): qty 100

## 2021-07-18 MED ORDER — ALTEPLASE 2 MG IJ SOLR: 2.0000 mg | Freq: Once | INTRAMUSCULAR | Status: DC

## 2021-07-18 MED ORDER — FENTANYL CITRATE (PF) 100 MCG/2ML IJ SOLN
25.0000 ug | INTRAMUSCULAR | Status: DC | PRN
  Administered 2021-07-18 – 2021-07-20 (×3): 25 ug via INTRAVENOUS
  Filled 2021-07-18 (×3): qty 2

## 2021-07-18 MED ORDER — SODIUM CHLORIDE 0.9 % IV SOLN
2.0000 g | INTRAVENOUS | Status: DC
  Administered 2021-07-18: 2 g via INTRAVENOUS
  Filled 2021-07-18: qty 20

## 2021-07-18 MED ORDER — IOHEXOL 350 MG/ML SOLN
100.0000 mL | Freq: Once | INTRAVENOUS | Status: AC | PRN
  Administered 2021-07-18: 70 mL via INTRAVENOUS

## 2021-07-18 MED ORDER — PANTOPRAZOLE SODIUM 40 MG IV SOLR
40.0000 mg | Freq: Every day | INTRAVENOUS | Status: DC
  Administered 2021-07-18 – 2021-07-28 (×11): 40 mg via INTRAVENOUS
  Filled 2021-07-18 (×11): qty 40

## 2021-07-18 MED ORDER — NOREPINEPHRINE 4 MG/250ML-% IV SOLN
0.0000 ug/min | INTRAVENOUS | Status: DC
Start: 2021-07-18 — End: 2021-07-21
  Administered 2021-07-18: 2 ug/min via INTRAVENOUS
  Administered 2021-07-18: 9 ug/min via INTRAVENOUS
  Administered 2021-07-19: 2 ug/min via INTRAVENOUS
  Filled 2021-07-18 (×3): qty 250

## 2021-07-18 MED ORDER — CLINDAMYCIN PHOSPHATE 900 MG/50ML IV SOLN
900.0000 mg | Freq: Three times a day (TID) | INTRAVENOUS | Status: DC
  Filled 2021-07-18: qty 50

## 2021-07-18 MED ORDER — PIPERACILLIN-TAZOBACTAM 3.375 G IVPB
3.3750 g | Freq: Three times a day (TID) | INTRAVENOUS | Status: DC
  Administered 2021-07-18: 3.375 g via INTRAVENOUS
  Filled 2021-07-18 (×2): qty 50

## 2021-07-18 MED ORDER — VANCOMYCIN HCL 2000 MG/400ML IV SOLN
2000.0000 mg | INTRAVENOUS | Status: DC
  Administered 2021-07-18: 2000 mg via INTRAVENOUS
  Filled 2021-07-18: qty 400

## 2021-07-18 NOTE — Progress Notes (Addendum)
OVERNIGHT COVERAGE CRITICAL CARE PROGRESS NOTE  Patient transferred from [facility] for surgical evaluation.  Please see CONSULT NOTE by Dr. Chesley Mires (8/24 2:48PM).  At the time of clinical interview, the patient is awake, alert and oriented.  She is on norepinephrine for blood pressure support.  Of note, she has a large discrepancy in blood pressure cuff readings (R >> L).  Endorses abdominal pain.  Abdominal pad dressing taken down for examination revealing soiled gauze, which was removed to reveal free-flowing stool.  Case discussed with Dr. Bobbye Morton (General Surgery), who has agreed to come in to examine the patient and give recommendations for wound care.  Principal Problem:   Septic shock ([place]) Active Problems:   Enterocutaneous fistula   Cellulitis   Hypokalemia   Colon carcinoma (HCC)   AKI (acute kidney injury) ([place])   Hyponatremia   Morbid obesity with BMI of 50.0-59.9, adult (HCC)  LR @ 125 mL/hr Place Foley Titrate norepinephrine for MAP 65+ Continue meropenenm/vancomycin/anidulafungin Surgery help appreciated   Critical care time: 45 minutes.  The treatment and management of the patient's condition was required based on the threat of imminent deterioration. This time reflects time spent by the physician evaluating, providing care and managing the critically ill patient's care. The time was spent at the immediate bedside (or on the same floor/unit and dedicated to this patient's care). Time involved in separately billable procedures is NOT included int he critical care time indicated above. Family meeting and update time may be included above if and only if the patient is unable/incompetent to participate in clinical interview and/or decision making, and the discussion was necessary to determining treatment decisions.  Renee Pain, MD Board Certified by the ABIM, [place]

## 2021-07-18 NOTE — Progress Notes (Signed)
eLink Physician-Brief Progress Note Patient Name: Lauren [name] DOB: 1946/05/09 MRN: [id number]   Date of Service  07/18/2021  HPI/Events of Note  Patient transferred from [facility] after being brought in for out of hospital cardiac arrest, she regained consciousness and was alert and oriented in the ED, CT scan showed massive intra-peritoneal soiling from a disrupted surgical anastomosis, with colo-cutaneous fistula and intra-abdominal sepsis.  eICU Interventions  New Patient Evaluation. Antibiotic coverage broadened with Meropenem substitution for Zosyn, and the addition of Eraxis for fungal coverage.        Kerry Kass Anber Mckiver 07/18/2021, 9:11 PM

## 2021-07-18 NOTE — Progress Notes (Signed)
Assessment of PICC line. Noted that line was kinked off at upper arm fat fold. Upon straightening line, able to flush with great blood return. X-ray ordered to confirm placement, then will redress PICC site so decrease risk of kinking of line. Notified nurse. VU. Fran Lowes, RN VAST

## 2021-07-18 NOTE — Progress Notes (Signed)
Pharmacy Antibiotic Note  Lauren [name] is a 75 y.o. female admitted on 07/18/2021 with  intra-abdominal infection .  Pharmacy has been consulted for Cefepime dosing. R-hemicolectomy 07/06/21. Just discharged 8/23 and patient presenting with abdominal pain  Plan: Cefepime 2gm IV q12h F/U cxs and clinical progress Monitor V/S, labs  Height: '5\' 2"'$  (157.5 cm) Weight: 114.9 kg (253 lb 4.9 oz) IBW/kg (Calculated) : 50.1  Temp (24hrs), Avg:97.9 F (36.6 C), Min:97.5 F (36.4 C), Max:98.3 F (36.8 C)  Recent Labs  Lab 07/13/21 0611 07/14/21 0500 07/14/21 0621 07/14/21 1401 07/15/21 0616 07/18/21 0830 07/18/21 0835 07/18/21 1022  WBC 15.0*  --  9.8 10.4 10.3 33.1*  --   --   CREATININE 0.94 1.10*  --   --  0.94 1.58*  --   --   LATICACIDVEN  --   --   --   --   --   --  4.1* 2.3*    Estimated Creatinine Clearance: 38.6 mL/min (A) (by C-G formula based on SCr of 1.58 mg/dL (H)).    Allergies  Allergen Reactions   Codeine Itching   Compazine [Prochlorperazine Edisylate]    Prednisone Nausea And Vomiting   Tetanus Toxoids Other (See Comments)    Knot like "hen egg" came up    Vicodin [Hydrocodone-Acetaminophen] Itching    Antimicrobials this admission: cefempime 8/24 >>  Flagyl 8/24 x 1 in ED  Microbiology results: 8/24 BCx: pending  Thank you for allowing pharmacy to be a part of this patient's care.  Isac Sarna, BS Pharm D, [place] Clinical Pharmacist Pager [phone] 07/18/2021 11:51 AM

## 2021-07-18 NOTE — Consult Note (Signed)
Reason for Consult: Sepsis Referring Physician: Dr. Oswaldo Conroy is an 75 y.o. female.  HPI: Patient is a 75 year old white female status post right hemicolectomy for a T3, N2, M0 colon carcinoma on 07/06/2021 who was discharged to a skilled nursing unit yesterday.  This morning, she was brought by ambulance due to hypotension and worsening abdominal pain.  A CT scan of the abdomen and pelvis was performed which revealed extensive subcutaneous emphysema of the abdominal wall extending around to the back.  She was also noted to be septic.  Her history is limited as she was somewhat confused, but did say she was in some pain and does remember being in a skilled nursing unit yesterday evening.  Past Medical History:  Diagnosis Date   A-fib [facility])    Acute cholecystitis 12/06/2012   Atrial fibrillation with RVR ([place]) 12/06/2012   Diverticulosis    Hypothyroidism    Obesity    Pancreatitis, acute 0000000   Periumbilical hernia     Past Surgical History:  Procedure Laterality Date   BIOPSY  07/05/2021   Procedure: BIOPSY;  Surgeon: [name], DO;  [facility];  Service: Endoscopy;;   CHOLECYSTECTOMY N/A 03/25/2014   Procedure: LAPAROSCOPIC CHOLECYSTECTOMY WITH INTRAOPERATIVE CHOLANGIOGRAM;  Surgeon: Ralene Ok, MD;  Location: [place];  Service: General;  Laterality: N/A;   COLONOSCOPY WITH PROPOFOL N/A 07/05/2021   Procedure: COLONOSCOPY WITH PROPOFOL;  Surgeon: [name], DO;  [facility];  Service: Endoscopy;  Laterality: N/A;   ESOPHAGOGASTRODUODENOSCOPY (EGD) WITH PROPOFOL N/A 07/05/2021   Procedure: ESOPHAGOGASTRODUODENOSCOPY (EGD) WITH PROPOFOL;  Surgeon: [name], DO;  [facility];  Service: Endoscopy;  Laterality: N/A;   PARTIAL COLECTOMY N/A 07/06/2021   Procedure: RIGHT HEMICOLECTOMY;  Surgeon: [name] Signs, MD;  Location: [facility];  Service: General;  Laterality: N/A;   POLYPECTOMY  07/05/2021   Procedure:  POLYPECTOMY;  Surgeon: [name], DO;  [facility];  Service: Endoscopy;;    Family History  Problem Relation Age of Onset   Cancer Father        liver   Hypertension Brother    Heart disease Maternal Grandmother    Kidney disease Maternal Grandfather    Diabetes Maternal Grandfather    Kidney failure Brother     Social History:  reports that she has never smoked. She has never used smokeless tobacco. She reports that she does not drink alcohol and does not use drugs.  Allergies:  Allergies  Allergen Reactions   Codeine Itching   Compazine [Prochlorperazine Edisylate]    Prednisone Nausea And Vomiting   Tetanus Toxoids Other (See Comments)    Knot like "hen egg" came up    Vicodin [Hydrocodone-Acetaminophen] Itching    Medications: I have reviewed the patient's current medications. Prior to Admission: (Not in a hospital admission)   Results for orders placed or performed during the hospital encounter of 07/18/21 (from the past 48 hour(s))  POC occult blood, ED     Status: Abnormal   Collection Time: 07/18/21  7:42 AM  Result Value Ref Range   Fecal Occult Bld POSITIVE (A) NEGATIVE  Comprehensive metabolic panel     Status: Abnormal   Collection Time: 07/18/21  8:30 AM  Result Value Ref Range   Sodium 129 (L) 135 - 145 mmol/L   Potassium 2.8 (L) 3.5 - 5.1 mmol/L   Chloride 94 (L) 98 - 111 mmol/L   CO2 24 22 -  32 mmol/L   Glucose, Bld 83 70 - 99 mg/dL    Comment: Glucose reference range applies only to samples taken after fasting for at least 8 hours.   BUN 17 8 - 23 mg/dL   Creatinine, Ser 1.58 (H) 0.44 - 1.00 mg/dL   Calcium 7.2 (L) 8.9 - 10.3 mg/dL   Total Protein 5.4 (L) 6.5 - 8.1 g/dL   Albumin 2.0 (L) 3.5 - 5.0 g/dL   AST 28 15 - 41 U/L   ALT 17 0 - 44 U/L   Alkaline Phosphatase 102 38 - 126 U/L   Total Bilirubin 1.7 (H) 0.3 - 1.2 mg/dL   GFR, Estimated 35 (L) >60 mL/min    Comment: (NOTE) Calculated using the CKD-EPI Creatinine Equation  (2021)    Anion gap 11 5 - 15    Comment: Performed at [facility], [address] [place], [place] [address]  CBC WITH DIFFERENTIAL     Status: Abnormal   Collection Time: 07/18/21  8:30 AM  Result Value Ref Range   WBC 33.1 (H) 4.0 - 10.5 K/uL   RBC 4.39 3.87 - 5.11 MIL/uL   Hemoglobin 9.8 (L) 12.0 - 15.0 g/dL   HCT 34.2 (L) 36.0 - 46.0 %   MCV 77.9 (L) 80.0 - 100.0 fL   MCH 22.3 (L) 26.0 - 34.0 pg   MCHC 28.7 (L) 30.0 - 36.0 g/dL   RDW 28.7 (H) 11.5 - 15.5 %   Platelets 318 150 - 400 K/uL   nRBC 0.0 0.0 - 0.2 %   Neutrophils Relative % 87 %   Neutro Abs 28.8 (H) 1.7 - 7.7 K/uL   Lymphocytes Relative 4 %   Lymphs Abs 1.4 0.7 - 4.0 K/uL   Monocytes Relative 5 %   Monocytes Absolute 1.6 (H) 0.1 - 1.0 K/uL   Eosinophils Relative 0 %   Eosinophils Absolute 0.1 0.0 - 0.5 K/uL   Basophils Relative 1 %   Basophils Absolute 0.2 (H) 0.0 - 0.1 K/uL   RBC Morphology MACROCYTES PRESENT    Immature Granulocytes 3 %   Abs Immature Granulocytes 1.11 (H) 0.00 - 0.07 K/uL   Ovalocytes PRESENT    Stomatocytes PRESENT     Comment: Performed at [facility], [address] [place], [place] [address]  Protime-INR     Status: Abnormal   Collection Time: 07/18/21  8:30 AM  Result Value Ref Range   Prothrombin Time 30.6 (H) 11.4 - 15.2 seconds   INR 2.9 (H) 0.8 - 1.2    Comment: (NOTE) INR goal varies based on device and disease states. Performed at [facility], [address], [place], [place] [address]   APTT     Status: None   Collection Time: 07/18/21  8:30 AM  Result Value Ref Range   aPTT 33 24 - 36 seconds    Comment: Performed at [facility], [address] [place], [place] [address]  Lactic acid, plasma     Status: Abnormal   Collection Time: 07/18/21  8:35 AM  Result Value Ref Range   Lactic Acid, Venous 4.1 (HH) 0.5 - 1.9 mmol/L    Comment: CRITICAL RESULT CALLED TO, READ BACK BY AND VERIFIED WITH: LONG,L AT 9:10AM ON 07/18/21 BY [facility] Performed at [facility], [address] [place], [place] [address]   Blood culture (routine single)     Status: None (Preliminary result)   Collection Time: 07/18/21  8:35 AM   Specimen: Right Antecubital; Blood  Result  Value Ref Range   Specimen Description RIGHT ANTECUBITAL    Special Requests      BOTTLES DRAWN AEROBIC AND ANAEROBIC Blood Culture adequate volume Performed at [facility], [address] [place], [place] [address]    Culture PENDING    Report Status PENDING   Type and screen     Status: None   Collection Time: 07/18/21  8:35 AM  Result Value Ref Range   ABO/RH(D) O POS    Antibody Screen NEG    Sample Expiration      07/21/2021,2359 Performed at [facility], [address] [place], [place] [address]   Resp Panel by RT-PCR (Flu A&B, Covid) Nasopharyngeal Swab     Status: Abnormal   Collection Time: 07/18/21  9:18 AM   Specimen: Nasopharyngeal Swab; Nasopharyngeal(NP) swabs in vial transport medium  Result Value Ref Range   SARS Coronavirus 2 by RT PCR POSITIVE (A) NEGATIVE    Comment: RESULT CALLED TO, READ BACK BY AND VERIFIED WITH: OAKLY,B AT 1425 BY HUFFINES,S ON 07/18/21. (NOTE) SARS-CoV-2 target nucleic acids are DETECTED.  The SARS-CoV-2 RNA is generally detectable in upper respiratory specimens during the acute phase of infection. Positive results are indicative of the presence of the identified virus, but do not rule out bacterial infection or co-infection with other pathogens not detected by the test. Clinical correlation with patient history and other diagnostic information is necessary to determine patient infection status. The expected result is Negative.  Fact Sheet for Patients: EntrepreneurPulse.com.au  Fact Sheet for Healthcare Providers: IncredibleEmployment.be  This test is not yet approved or cleared by the [place] FDA and  has been authorized for detection and/or diagnosis of SARS-CoV-2 by FDA under an Emergency  Use Authorization (EUA).  This EUA will remain in effect (meaning this te st can be used) for the duration of  the COVID-19 declaration under Section 564(b)(1) of the Act, 21 U.S.C. section 360bbb-3(b)(1), unless the authorization is terminated or revoked sooner.     Influenza A by PCR NEGATIVE NEGATIVE   Influenza B by PCR NEGATIVE NEGATIVE    Comment: (NOTE) The Xpert Xpress SARS-CoV-2/FLU/RSV plus assay is intended as an aid in the diagnosis of influenza from Nasopharyngeal swab specimens and should not be used as a sole basis for treatment. Nasal washings and aspirates are unacceptable for Xpert Xpress SARS-CoV-2/FLU/RSV testing.  Fact Sheet for Patients: EntrepreneurPulse.com.au  Fact Sheet for Healthcare Providers: IncredibleEmployment.be  This test is not yet approved or cleared by the [place] FDA and has been authorized for detection and/or diagnosis of SARS-CoV-2 by FDA under an Emergency Use Authorization (EUA). This EUA will remain in effect (meaning this test can be used) for the duration of the COVID-19 declaration under Section 564(b)(1) of the Act, 21 U.S.C. section 360bbb-3(b)(1), unless the authorization is terminated or revoked.  Performed at [facility], [address] [place], [place] [address]   Lactic acid, plasma     Status: Abnormal   Collection Time: 07/18/21 10:22 AM  Result Value Ref Range   Lactic Acid, Venous 2.3 (HH) 0.5 - 1.9 mmol/L    Comment: CRITICAL VALUE NOTED.  VALUE IS CONSISTENT WITH PREVIOUSLY REPORTED AND CALLED VALUE. Performed at [facility], [address] [place],  [address]   Blood Culture (routine x 2)     Status: None (Preliminary result)   Collection Time: 07/18/21 10:22 AM   Specimen: BLOOD  Result Value Ref Range   Specimen Description BLOOD RIGHT ANTECUBITAL  Special Requests      BOTTLES DRAWN AEROBIC AND ANAEROBIC Blood Culture adequate volume Performed at [facility], [address] [place], [place] [address]    Culture PENDING    Report Status PENDING     CT ABDOMEN PELVIS W CONTRAST  Result Date: 07/18/2021 CLINICAL DATA:  Abscess suspected EXAM: CT ABDOMEN AND PELVIS WITH CONTRAST TECHNIQUE: Multidetector CT imaging of the abdomen and pelvis was performed using the standard protocol following bolus administration of intravenous contrast. CONTRAST:  64m OMNIPAQUE IOHEXOL 350 MG/ML SOLN COMPARISON:  CT abdomen/pelvis 12/04/2012 FINDINGS: Lower chest: There are small bilateral pleural effusions with adjacent atelectasis. The imaged heart is unremarkable. Hepatobiliary: The liver is unremarkable. The gallbladder is surgically absent. Mild prominence of the common bile duct and intrahepatic ducts is likely due to the history of cholecystectomy. Pancreas: The pancreas is mildly atrophic. There are no focal lesions or contour abnormalities. There is no peripancreatic inflammatory change. Spleen: Unremarkable. Adrenals/Urinary Tract: The adrenals are unremarkable. There are no focal renal lesions or stones. There is no hydronephrosis or hydroureter. The bladder is unremarkable. There is no excretion of contrast on the delayed images into the collecting system. Stomach/Bowel: The stomach is grossly unremarkable. The patient is status post right hemicolectomy with a sutures identified in the mid abdomen. 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 This communicates with a 7.4 cm by 5.7 cm by 6.9 cm collection in the anterior abdominal wall. There is mild thickening of multiple loops of small bowel in the lower mid abdomen. There are scattered colonic diverticula without evidence of acute diverticulitis. Vascular/Lymphatic: There is scattered calcified atherosclerotic plaque throughout the nonaneurysmal abdominal aorta. The main portal and  splenic veins are patent. There is no abdominal or pelvic lymphadenopathy. Reproductive: The uterus and adnexa are unremarkable. Other: There is small volume ascites scattered throughout the abdomen. Haziness in the mesenteric fat likely reflects inflammatory change. There is no free intraperitoneal air. There is extensive gas tracking throughout the soft tissues of the abdomen into the right back and axilla. No other organized or drainable fluid collections are identified. Musculoskeletal: There is no acute osseous abnormality. IMPRESSION: 1. Status post right hemicolectomy with a suture site in the mid abdomen. 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 The fistula is in communication with a focal collection with layering debris measuring up to 7.4 cm in the abdominal wall consistent with abscess. 2. Extensive soft tissue emphysema tracking throughout the body wall may be coming from the bowel, though necrotizing fasciitis would have a similar appearance. This was discussed with Dr ZRoderic Palauas below. 3. Mild inflammatory change of loops of small bowel and scattered ascites throughout the abdomen, likely reactive. No free intraperitoneal air is identified. 4. Absence of contrast in the renal collecting systems on the delayed phase images; correlate with renal function. 5. Small bilateral pleural effusions. These results were called by telephone at the time of interpretation on 07/18/2021 at 10:34 am to provider [facility] , who verbally acknowledged these results. Electronically Signed   By: PValetta MoleM.D.   On: 07/18/2021 10:38   DG Chest Portable 1 View  Result Date: 07/18/2021 CLINICAL DATA:  PICC line placement. EXAM: PORTABLE CHEST 1 VIEW COMPARISON:  July 18, 2021.  FINDINGS: Stable cardiomediastinal silhouette. Interval placement of right-sided PICC line with distal tip in  expected position of the SVC. Mild bibasilar subsegmental atelectasis is noted with small pleural effusions. Bony thorax is unremarkable. IMPRESSION: Interval placement of right-sided PICC line with distal tip in expected position of the SVC. Mild bibasilar subsegmental atelectasis with small pleural effusions. Electronically Signed   By: Marijo Conception M.D.   On: 07/18/2021 15:01   DG Chest Port 1 View  Result Date: 07/18/2021 CLINICAL DATA:  Question sepsis EXAM: PORTABLE CHEST 1 VIEW COMPARISON:  Multiple chest radiographs, most recently 07/12/2021. KUB, 07/12/2021. FINDINGS: Support lines: Interval removal of RIGHT upper extremity PICC. Overlying support needs. Cardiac silhouette enlargement, unchanged. Low lung volumes. The RIGHT lung is clear. LEFT basilar consolidation obscuring the cardiac apex, unchanged. No pleural effusion or pneumothorax. Appearance of subcutaneous gas tracking along the RIGHT chest wall, axilla and imaged RIGHT upper quadrant. No interval osseous abnormality. IMPRESSION: 1. Interval removal RIGHT upper extremity PICC 2. LEFT basilar atelectasis, unchanged. 3. Overlying density with appearance of tracking subcutaneous gas along the RIGHT axilla and upper quadrant. Clinical correlation is recommended. Electronically Signed   By: Michaelle Birks M.D.   On: 07/18/2021 08:00   [place] EKG SITE RITE  Result Date: 07/18/2021 If [facility] image not attached, placement could not be confirmed due to current cardiac rhythm.   ROS:  Review of systems not obtained due to patient factors.  Blood pressure 104/64, pulse 97, temperature 98.3 F (36.8 C), temperature source Rectal, resp. rate 19, height '5\' 2"'$  (1.575 m), weight 114.9 kg, last menstrual period 09/13/2016, SpO2 94 %, unknown if currently breastfeeding. Physical Exam: White female who is somewhat fatigued, but alert Lungs are clear to auscultation with good breath sounds bilaterally. Heart examination reveals an irregular rate and  rhythm.  She is currently at 55. Abdomen is diffusely tender.  Her midline incision was opened at bedside and I immediately got a rush of air and stool that appeared to be under pressure.  The incision was fully opened down to the fascia.  It was difficult to fully evaluate the fascia due to the stool.  She was cleaned at bedside as much as possible.  Wet-to-dry dressing was then applied.  Some air did emanate from the right lateral aspect of the wound when I pressed on her abdomen.  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  I did review her CT scan with radiology and this appears to be more of a cellulitis pattern than that of necrotizing fasciitis, but obviously this cannot be ruled out.  If this is necrotizing fasciitis, she will have a significant loss of domain of the abdominal wall which may not be survivable.  It appears the fistula is emanating from a point just distal to the ileocolic anastomosis.  There is a small area of possible intra-abdominal abscess, but no significant large intra-abdominal abscesses present.  She has now turned COVID-positive.  She has a significant cardiac history with an ejection fraction of 30%.  She has also been on Xarelto for atrial fibrillation.  Her last dose was apparently yesterday. I did open up this wound that was under pressure at bedside, so an initial opening of the wound may help decompress the subcutaneous emphysema of the abdominal wall.  Given her multiple comorbidities, critical care and the hospitalist feel that she would be better served down at [facility] ICU.  I have  let Barkley Boards, PA, [facility] know about the patient.  She states she will let Dr. Brantley Stage know.  The patient is aware that she is critically ill and this may not be survivable.  [name] Signs 07/18/2021, 4:34 PM

## 2021-07-18 NOTE — Progress Notes (Addendum)
75 yo female  with h/o  atrial fibrillation, morbid obesity, diastolic CHF, hypertension, chronic lymphedema metastatic adenocarcinoma of the right colon with associated anemia and GI bleed who  was discharged from the hospital on 07/17/2021 after  Rt hemicolectomy on 07/06/21.    -Returns on 07/18/21 with altered mentation, persistent hypotension despite IV fluids and CT abd/pelvis showed fistula extending from bowel adjacent to suture through an umbilical hernia into abdominal wall with evidence for connection to overlying skin and this communicates (colocutaneous fistula )with 7.4 x 5.7 x 6.9 cm fluid collection in the anterior abdominal wall, extensive gas tracking through soft tissues of the abdomen into Rt back and axilla concerning for necrotizing fasciitis  -Patient in septic shock with significant leukocytosis, persistent hypotension despite aggressive IV fluids requiring Levophed currently at 9 mics via picc line  -General surgeon Dr. Arnoldo Morale was able to open up the abdominal wound -Discussed with PCCM attending Dr. Halford Chessman--  -After three-way conference with Dr. Arnoldo Morale general surgeon and Dr. Halford Chessman the PCCM attending -the general surgeon and PCCM attending felt that patient should be transferred to [facility] under PCCM service with general surgery following as patient may need operative/surgical interventions, may need plastics/wound care team  -- They recommended TPN via PICC line as patient will probably be n.p.o. for long time  -[facility] hospitalist service will Not admit this patient -General surgeon Dr. Arnoldo Morale with contact [facility] surgeons in [place] --PCCM attending Dr. Halford Chessman will contact his counterparts at critical care service at [facility] to assume care of this patient  -Patient is COVID-positive  -Total care time over 47 minutes CRITICAL CARE Performed by: Roxan Hockey  Total critical care time: 47 minutes  Critical care time was exclusive of  separately billable procedures and treating other patients.  - septic shock with significant leukocytosis, persistent hypotension despite aggressive IV fluids requiring Levophed currently at 9 mics via picc line -Patient initially received cefepime, Rocephin and Flagyl -Dr. Halford Chessman the PCCM attending would like to change this to Vanco and Zosyn  Critical care was necessary to treat or prevent imminent or life-threatening deterioration.  Critical care was time spent personally by me on the following activities: development of treatment plan with patient and/or surrogate as well as nursing, discussions with consultants, evaluation of patient's response to treatment, examination of patient, obtaining history from patient or surrogate, ordering and performing treatments and interventions, ordering and review of laboratory studies, ordering and review of radiographic studies, pulse oximetry and re-evaluation of patient's condition.   -[facility] service will Not be involved in patient's care unless consulted at a later date    Roxan Hockey, MD

## 2021-07-18 NOTE — Sepsis Progress Note (Signed)
eLink is monitoring this Code Sepsis. °

## 2021-07-18 NOTE — Progress Notes (Signed)
Spoke with primary RN Daphyne re; PICC placement. Patient BP is soft and unstable v/s. Unable to place PICC at bedside at this time. Recommended central placement. Will reevaluate and continue to monitor patient.

## 2021-07-18 NOTE — Progress Notes (Signed)
Pharmacy Antibiotic Note  Lauren [name] is a 75 y.o. female admitted on 07/18/2021 with cellulitis.  Pharmacy has been consulted for Vancomycin and zosyn dosing. Possible necrotizing fascitis  Plan: Vancomycin 2gm IV q24h expected AUC 505 Zosyn 3.375g IV q8h (4 hour infusion). Also, on Clindamycin '900mg'$  IV q8h F/U cxs and clinical progress Monitor V/S, and levels as indicated  Height: '5\' 2"'$  (157.5 cm) Weight: 114.9 kg (253 lb 4.9 oz) IBW/kg (Calculated) : 50.1  Temp (24hrs), Avg:98.3 F (36.8 C), Min:98.3 F (36.8 C), Max:98.3 F (36.8 C)  Recent Labs  Lab 07/13/21 0611 07/14/21 0500 07/14/21 0621 07/14/21 1401 07/15/21 0616 07/18/21 0830 07/18/21 0835 07/18/21 1022  WBC 15.0*  --  9.8 10.4 10.3 33.1*  --   --   CREATININE 0.94 1.10*  --   --  0.94 1.58*  --   --   LATICACIDVEN  --   --   --   --   --   --  4.1* 2.3*    Estimated Creatinine Clearance: 38.6 mL/min (A) (by C-G formula based on SCr of 1.58 mg/dL (H)).    Allergies  Allergen Reactions   Codeine Itching   Compazine [Prochlorperazine Edisylate]    Prednisone Nausea And Vomiting   Tetanus Toxoids Other (See Comments)    Knot like "hen egg" came up    Vicodin [Hydrocodone-Acetaminophen] Itching    Antimicrobials this admission: Vancomycin 8/24 >>  zosyn 8/24 >>  Clindamycin 8/24>>  Microbiology results: 8/24 BCx: pending  MRSA PCR:   Thank you for allowing pharmacy to be a part of this patient's care.  Isac Sarna, BS [place], [place] Clinical Pharmacist Pager [phone] 07/18/2021 3:45 PM

## 2021-07-18 NOTE — ED Notes (Signed)
Date and time results received: 07/18/21 1424   Test: Covid Critical Value: positive  Name of Provider Notified: [facility]  Orders Received? Or Actions Taken? See orders

## 2021-07-18 NOTE — ED Notes (Signed)
Called for a Picc Line no answer.

## 2021-07-18 NOTE — ED Notes (Signed)
ED TO INPATIENT HANDOFF REPORT  ED Nurse Name and Phone #:   S Name/Age/Gender Lauren [name] 75 y.o. female Room/Bed: APA15/APA15  Code Status   Code Status: Full Code  Home/SNF/Other Nursing Home Patient oriented to: self, place, time and situation Is this baseline? Yes   Triage Complete: Triage complete  Chief Complaint Sepsis ([place]) [A41.9] Septic shock ([place]) [A41.9, R65.21]  Triage Note Pt to the ED [facility] from [place] called out for post CPR.  EMS reports pelican was performing CPR prior to arrival but the pt was A&O x4 when EMS arrived.  CBG was 168. Pt is hypotensive with afib and history of the same.Pt had recent abdominal surgery and just arrived at [facility] yesterday.     Allergies Allergies  Allergen Reactions  . Codeine Itching  . Compazine [Prochlorperazine Edisylate]   . Prednisone Nausea And Vomiting  . Tetanus Toxoids Other (See Comments)    Knot like "hen egg" came up   . Vicodin [Hydrocodone-Acetaminophen] Itching    Level of Care/Admitting Diagnosis ED Disposition    ED Disposition  Admit   Condition  --   [place]: [facility] [[id number]]  Level of Care: ICU [6]  May admit patient to [facility] or [facility] if equivalent level of care is available:: No  Interfacility transfer: Yes  Covid Evaluation: Confirmed COVID Positive  Diagnosis: [facility] shock [id number]  Admitting Physician: Chesley Mires [[id number]]  Attending Physician: Chesley Mires [[id number]]  Estimated length of stay: > 1 week  Certification:: I certify this patient will need inpatient services for at least 2 midnights         B Medical/Surgery History Past Medical History:  Diagnosis Date  . A-fib ([place])   . Acute cholecystitis 12/06/2012  . Atrial fibrillation with RVR ([place]) 12/06/2012  . Diverticulosis   . Hypothyroidism   . Obesity   . Pancreatitis, acute 12/04/2012  . Periumbilical hernia    Past Surgical History:  Procedure  Laterality Date  . BIOPSY  07/05/2021   Procedure: BIOPSY;  Surgeon: [name], DO;  [facility];  Service: Endoscopy;;  . CHOLECYSTECTOMY N/A 03/25/2014   Procedure: LAPAROSCOPIC CHOLECYSTECTOMY WITH INTRAOPERATIVE CHOLANGIOGRAM;  Surgeon: Ralene Ok, MD;  Location: [facility];  Service: General;  Laterality: N/A;  . COLONOSCOPY WITH PROPOFOL N/A 07/05/2021   Procedure: COLONOSCOPY WITH PROPOFOL;  Surgeon: [name], DO;  [facility];  Service: Endoscopy;  Laterality: N/A;  . ESOPHAGOGASTRODUODENOSCOPY (EGD) WITH PROPOFOL N/A 07/05/2021   Procedure: ESOPHAGOGASTRODUODENOSCOPY (EGD) WITH PROPOFOL;  Surgeon: [name], DO;  [facility];  Service: Endoscopy;  Laterality: N/A;  . PARTIAL COLECTOMY N/A 07/06/2021   Procedure: RIGHT HEMICOLECTOMY;  Surgeon: [name] Signs, MD;  Location: [facility];  Service: General;  Laterality: N/A;  . POLYPECTOMY  07/05/2021   Procedure: POLYPECTOMY;  Surgeon: [name], DO;  [facility];  Service: Endoscopy;;     A IV Location/Drains/Wounds Patient Lines/Drains/Airways Status    Active Line/Drains/Airways    Name Placement date Placement time Site Days   Peripheral IV 07/18/21 20 G 1" Left Antecubital 07/18/21  0800  Antecubital  less than 1   PICC Triple Lumen 07/18/21 PICC Right Brachial 38 cm 3 cm 07/18/21  1400  -- less than 1   External Urinary Catheter 07/18/21  0759  --  less than 1   Incision (Closed) 07/06/21 Abdomen Other (Comment) 07/06/21  1220  -- 12  Wound / Incision (Open or Dehisced) 07/15/21 (MASD) Moisture Associated Skin Damage Buttocks Right;Left;Medial 07/15/21  1247  Buttocks  3          Intake/Output Last 24 hours  Intake/Output Summary (Last 24 hours) at 07/18/2021 1828 Last data filed at 07/18/2021 1655 Gross per 24 hour  Intake 2947.9 ml  Output --  Net 2947.9 ml    Labs/Imaging Results for orders placed or performed during the hospital encounter of  07/18/21 (from the past 48 hour(s))  POC occult blood, ED     Status: Abnormal   Collection Time: 07/18/21  7:42 AM  Result Value Ref Range   Fecal Occult Bld POSITIVE (A) NEGATIVE  Comprehensive metabolic panel     Status: Abnormal   Collection Time: 07/18/21  8:30 AM  Result Value Ref Range   Sodium 129 (L) 135 - 145 mmol/L   Potassium 2.8 (L) 3.5 - 5.1 mmol/L   Chloride 94 (L) 98 - 111 mmol/L   CO2 24 22 - 32 mmol/L   Glucose, Bld 83 70 - 99 mg/dL    Comment: Glucose reference range applies only to samples taken after fasting for at least 8 hours.   BUN 17 8 - 23 mg/dL   Creatinine, Ser 1.58 (H) 0.44 - 1.00 mg/dL   Calcium 7.2 (L) 8.9 - 10.3 mg/dL   Total Protein 5.4 (L) 6.5 - 8.1 g/dL   Albumin 2.0 (L) 3.5 - 5.0 g/dL   AST 28 15 - 41 U/L   ALT 17 0 - 44 U/L   Alkaline Phosphatase 102 38 - 126 U/L   Total Bilirubin 1.7 (H) 0.3 - 1.2 mg/dL   GFR, Estimated 35 (L) >60 mL/min    Comment: (NOTE) Calculated using the CKD-EPI Creatinine Equation (2021)    Anion gap 11 5 - 15    Comment: Performed at [facility], [address], [place], [place] [address]  CBC WITH DIFFERENTIAL     Status: Abnormal   Collection Time: 07/18/21  8:30 AM  Result Value Ref Range   WBC 33.1 (H) 4.0 - 10.5 K/uL   RBC 4.39 3.87 - 5.11 MIL/uL   Hemoglobin 9.8 (L) 12.0 - 15.0 g/dL   HCT 34.2 (L) 36.0 - 46.0 %   MCV 77.9 (L) 80.0 - 100.0 fL   MCH 22.3 (L) 26.0 - 34.0 pg   MCHC 28.7 (L) 30.0 - 36.0 g/dL   RDW 28.7 (H) 11.5 - 15.5 %   Platelets 318 150 - 400 K/uL   nRBC 0.0 0.0 - 0.2 %   Neutrophils Relative % 87 %   Neutro Abs 28.8 (H) 1.7 - 7.7 K/uL   Lymphocytes Relative 4 %   Lymphs Abs 1.4 0.7 - 4.0 K/uL   Monocytes Relative 5 %   Monocytes Absolute 1.6 (H) 0.1 - 1.0 K/uL   Eosinophils Relative 0 %   Eosinophils Absolute 0.1 0.0 - 0.5 K/uL   Basophils Relative 1 %   Basophils Absolute 0.2 (H) 0.0 - 0.1 K/uL   RBC Morphology MACROCYTES PRESENT    Immature Granulocytes 3 %   Abs Immature  Granulocytes 1.11 (H) 0.00 - 0.07 K/uL   Ovalocytes PRESENT    Stomatocytes PRESENT     Comment: Performed at [facility], [address] [place], [place] [address]  Protime-INR     Status: Abnormal   Collection Time: 07/18/21  8:30 AM  Result Value Ref Range   Prothrombin Time 30.6 (H) 11.4 - 15.2 seconds  INR 2.9 (H) 0.8 - 1.2    Comment: (NOTE) INR goal varies based on device and disease states. Performed at [facility], [address] [place], [place] [address]   APTT     Status: None   Collection Time: 07/18/21  8:30 AM  Result Value Ref Range   aPTT 33 24 - 36 seconds    Comment: Performed at [facility], [address], [place], [place] [address]  Lactic acid, plasma     Status: Abnormal   Collection Time: 07/18/21  8:35 AM  Result Value Ref Range   Lactic Acid, Venous 4.1 (HH) 0.5 - 1.9 mmol/L    Comment: CRITICAL RESULT CALLED TO, READ BACK BY AND VERIFIED WITH: LONG,L AT 9:10AM ON 07/18/21 BY [facility] Performed at [facility], [address] [place], [place] [address]   Blood culture (routine single)     Status: None (Preliminary result)   Collection Time: 07/18/21  8:35 AM   Specimen: Right Antecubital; Blood  Result Value Ref Range   Specimen Description RIGHT ANTECUBITAL    Special Requests      BOTTLES DRAWN AEROBIC AND ANAEROBIC Blood Culture adequate volume Performed at [facility], [address], [place], [place] [address]    Culture PENDING    Report Status PENDING   Type and screen     Status: None   Collection Time: 07/18/21  8:35 AM  Result Value Ref Range   ABO/RH(D) O POS    Antibody Screen NEG    Sample Expiration      07/21/2021,2359 Performed at [facility], [address] [place], [place] [address]   Procalcitonin - Baseline     Status: None   Collection Time: 07/18/21  8:36 AM  Result Value Ref Range   Procalcitonin 1.06 ng/mL    Comment:        Interpretation: PCT > 0.5 ng/mL and <= 2 ng/mL: Systemic infection (sepsis) is  possible, but other conditions are known to elevate PCT as well. (NOTE)       Sepsis PCT Algorithm           Lower Respiratory Tract                                      Infection PCT Algorithm    ----------------------------     ----------------------------         PCT < 0.25 ng/mL                PCT < 0.10 ng/mL          Strongly encourage             Strongly discourage   discontinuation of antibiotics    initiation of antibiotics    ----------------------------     -----------------------------       PCT 0.25 - 0.50 ng/mL            PCT 0.10 - 0.25 ng/mL               OR       >80% decrease in PCT            Discourage initiation of  antibiotics      Encourage discontinuation           of antibiotics    ----------------------------     -----------------------------         PCT >= 0.50 ng/mL              PCT 0.26 - 0.50 ng/mL                AND       <80% decrease in PCT             Encourage initiation of                                             antibiotics       Encourage continuation           of antibiotics    ----------------------------     -----------------------------        PCT >= 0.50 ng/mL                  PCT > 0.50 ng/mL               AND         increase in PCT                  Strongly encourage                                      initiation of antibiotics    Strongly encourage escalation           of antibiotics                                     -----------------------------                                           PCT <= 0.25 ng/mL                                                 OR                                        > 80% decrease in PCT                                      Discontinue / Do not initiate                                             antibiotics  Performed at [facility], [address], [place], [place] [address]   Resp Panel by RT-PCR (Flu A&B, Covid) Nasopharyngeal Swab     Status:  Abnormal   Collection Time: 07/18/21  9:18 AM   Specimen: Nasopharyngeal Swab; Nasopharyngeal(NP) swabs in vial transport medium  Result Value Ref Range   SARS Coronavirus 2 by RT PCR POSITIVE (A) NEGATIVE    Comment: RESULT CALLED TO, READ BACK BY AND VERIFIED WITH: OAKLY,B AT 1425 BY HUFFINES,S ON 07/18/21. (NOTE) SARS-CoV-2 target nucleic acids are DETECTED.  The SARS-CoV-2 RNA is generally detectable in upper respiratory specimens during the acute phase of infection. Positive results are indicative of the presence of the identified virus, but do not rule out bacterial infection or co-infection with other pathogens not detected by the test. Clinical correlation with patient history and other diagnostic information is necessary to determine patient infection status. The expected result is Negative.  Fact Sheet for Patients: EntrepreneurPulse.com.au  Fact Sheet for Healthcare Providers: IncredibleEmployment.be  This test is not yet approved or cleared by the [place] FDA and  has been authorized for detection and/or diagnosis of SARS-CoV-2 by FDA under an Emergency Use Authorization (EUA).  This EUA will remain in effect (meaning this te st can be used) for the duration of  the COVID-19 declaration under Section 564(b)(1) of the Act, 21 U.S.C. section 360bbb-3(b)(1), unless the authorization is terminated or revoked sooner.     Influenza A by PCR NEGATIVE NEGATIVE   Influenza B by PCR NEGATIVE NEGATIVE    Comment: (NOTE) The Xpert Xpress SARS-CoV-2/FLU/RSV plus assay is intended as an aid in the diagnosis of influenza from Nasopharyngeal swab specimens and should not be used as a sole basis for treatment. Nasal washings and aspirates are unacceptable for Xpert Xpress SARS-CoV-2/FLU/RSV testing.  Fact Sheet for Patients: EntrepreneurPulse.com.au  Fact Sheet for Healthcare  Providers: IncredibleEmployment.be  This test is not yet approved or cleared by the [place] FDA and has been authorized for detection and/or diagnosis of SARS-CoV-2 by FDA under an Emergency Use Authorization (EUA). This EUA will remain in effect (meaning this test can be used) for the duration of the COVID-19 declaration under Section 564(b)(1) of the Act, 21 U.S.C. section 360bbb-3(b)(1), unless the authorization is terminated or revoked.  Performed at [facility], [address] [place], [place] [address]   Lactic acid, plasma     Status: Abnormal   Collection Time: 07/18/21 10:22 AM  Result Value Ref Range   Lactic Acid, Venous 2.3 (HH) 0.5 - 1.9 mmol/L    Comment: CRITICAL VALUE NOTED.  VALUE IS CONSISTENT WITH PREVIOUSLY REPORTED AND CALLED VALUE. Performed at [facility], [address], [place], [place] [address]   Blood Culture (routine x 2)     Status: None (Preliminary result)   Collection Time: 07/18/21 10:22 AM   Specimen: BLOOD  Result Value Ref Range   Specimen Description BLOOD RIGHT ANTECUBITAL    Special Requests      BOTTLES DRAWN AEROBIC AND ANAEROBIC Blood Culture adequate volume Performed at [facility], [address], [place], [place] [address]    Culture PENDING    Report Status PENDING    CT ABDOMEN PELVIS W CONTRAST  Result Date: 07/18/2021 CLINICAL DATA:  Abscess suspected EXAM: CT ABDOMEN AND PELVIS WITH CONTRAST TECHNIQUE: Multidetector CT imaging of the abdomen and pelvis was performed using the standard protocol following bolus administration of intravenous contrast. CONTRAST:  39m OMNIPAQUE IOHEXOL 350 MG/ML SOLN COMPARISON:  CT abdomen/pelvis 12/04/2012 FINDINGS: Lower chest: There are small bilateral pleural effusions with adjacent atelectasis. The imaged heart is unremarkable. Hepatobiliary: The liver is unremarkable. The gallbladder is surgically absent.  Mild prominence of the common bile duct and intrahepatic ducts is  likely due to the history of cholecystectomy. Pancreas: The pancreas is mildly atrophic. There are no focal lesions or contour abnormalities. There is no peripancreatic inflammatory change. Spleen: Unremarkable. Adrenals/Urinary Tract: The adrenals are unremarkable. There are no focal renal lesions or stones. There is no hydronephrosis or hydroureter. The bladder is unremarkable. There is no excretion of contrast on the delayed images into the collecting system. Stomach/Bowel: The stomach is grossly unremarkable. The patient is status post right hemicolectomy with a sutures identified in the mid abdomen. 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 This communicates with a 7.4 cm by 5.7 cm by 6.9 cm collection in the anterior abdominal wall. There is mild thickening of multiple loops of small bowel in the lower mid abdomen. There are scattered colonic diverticula without evidence of acute diverticulitis. Vascular/Lymphatic: There is scattered calcified atherosclerotic plaque throughout the nonaneurysmal abdominal aorta. The main portal and splenic veins are patent. There is no abdominal or pelvic lymphadenopathy. Reproductive: The uterus and adnexa are unremarkable. Other: There is small volume ascites scattered throughout the abdomen. Haziness in the mesenteric fat likely reflects inflammatory change. There is no free intraperitoneal air. There is extensive gas tracking throughout the soft tissues of the abdomen into the right back and axilla. No other organized or drainable fluid collections are identified. Musculoskeletal: There is no acute osseous abnormality. IMPRESSION: 1. Status post right hemicolectomy with a suture site in the mid abdomen. 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 The fistula is in communication with a focal collection with layering debris measuring up to 7.4 cm in the abdominal wall consistent with abscess. 2. Extensive soft tissue emphysema tracking throughout the body wall may be coming from the bowel, though necrotizing fasciitis would have a similar appearance. This was discussed with Dr Roderic Palau as below. 3. Mild inflammatory change of loops of small bowel and scattered ascites throughout the abdomen, likely reactive. No free intraperitoneal air is identified. 4. Absence of contrast in the renal collecting systems on the delayed phase images; correlate with renal function. 5. Small bilateral pleural effusions. These results were called by telephone at the time of interpretation on 07/18/2021 at 10:34 am to provider [facility] ZAMMIT , who verbally acknowledged these results. Electronically Signed   By: Valetta Mole M.D.   On: 07/18/2021 10:38   DG Chest Portable 1 View  Result Date: 07/18/2021 CLINICAL DATA:  PICC line placement. EXAM: PORTABLE CHEST 1 VIEW COMPARISON:  July 18, 2021. FINDINGS: Stable cardiomediastinal silhouette. Interval placement of right-sided PICC line with distal tip in expected position of the SVC. Mild bibasilar subsegmental atelectasis is noted with small pleural effusions. Bony thorax is unremarkable. IMPRESSION: Interval placement of right-sided PICC line with distal tip in expected position of the SVC. Mild bibasilar subsegmental atelectasis with small pleural effusions. Electronically Signed   By: Marijo Conception M.D.   On: 07/18/2021 15:01   DG Chest Port 1 View  Result Date: 07/18/2021 CLINICAL DATA:  Question sepsis EXAM: PORTABLE CHEST 1 VIEW COMPARISON:  Multiple chest radiographs, most recently 07/12/2021. KUB, 07/12/2021. FINDINGS: Support lines: Interval removal of RIGHT upper extremity PICC. Overlying support needs. Cardiac silhouette enlargement, unchanged. Low lung volumes. The  RIGHT lung is  clear. LEFT basilar consolidation obscuring the cardiac apex, unchanged. No pleural effusion or pneumothorax. Appearance of subcutaneous gas tracking along the RIGHT chest wall, axilla and imaged RIGHT upper quadrant. No interval osseous abnormality. IMPRESSION: 1. Interval removal RIGHT upper extremity PICC 2. LEFT basilar atelectasis, unchanged. 3. Overlying density with appearance of tracking subcutaneous gas along the RIGHT axilla and upper quadrant. Clinical correlation is recommended. Electronically Signed   By: Michaelle Birks M.D.   On: 07/18/2021 08:00   [place] EKG SITE RITE  Result Date: 07/18/2021 If [facility] image not attached, placement could not be confirmed due to current cardiac rhythm.   Pending Labs Unresulted Labs (From admission, onward)    Start     Ordered   07/19/21 0500  Comprehensive metabolic panel  Tomorrow morning,   R        07/18/21 1635   07/19/21 0500  Magnesium  Tomorrow morning,   R        07/18/21 1635   07/19/21 0500  Phosphorus  Tomorrow morning,   R        07/18/21 1635   07/19/21 0500  CBC  Tomorrow morning,   R        07/18/21 1635   07/19/21 0500  Lactic acid, plasma  Tomorrow morning,   R        07/18/21 1639   07/19/21 0500  Procalcitonin  Daily,   R      07/18/21 1639   07/18/21 0741  Urinalysis, Routine w reflex microscopic  (Undifferentiated presentation (screening labs and basic nursing orders))  ONCE - STAT,   STAT        07/18/21 0741   07/18/21 0741  Urine Culture  (Undifferentiated presentation (screening labs and basic nursing orders))  ONCE - STAT,   STAT       Question:  Indication  Answer:  Sepsis   07/18/21 0741          Vitals/Pain Today's Vitals   07/18/21 1630 07/18/21 1645 07/18/21 1745 07/18/21 1800  BP: 120/87 116/62 (!) 108/56 (!) 115/56  Pulse: (!) 103 (!) 123 (!) 123 100  Resp: 19 17 (!) 22 20  Temp:      TempSrc:      SpO2: 97% 97% 90% 94%  Weight:      Height:      PainSc:        Isolation Precautions No  active isolations  Medications Medications  lactated ringers infusion (has no administration in time range)  norepinephrine (LEVOPHED) '4mg'$  in 24m premix infusion (9 mcg/min Intravenous Rate/Dose Change 07/18/21 1609)  clindamycin (CLEOCIN) IVPB 900 mg (has no administration in time range)  piperacillin-tazobactam (ZOSYN) IVPB 3.375 g (3.375 g Intravenous New Bag/Given 07/18/21 1618)  vancomycin (VANCOREADY) IVPB 2000 mg/400 mL (has no administration in time range)  pantoprazole (PROTONIX) injection 40 mg (has no administration in time range)  potassium chloride 10 mEq in 100 mL IVPB (has no administration in time range)  sodium chloride 0.9 % bolus 500 mL (0 mLs Intravenous Stopped 07/18/21 0910)  sodium chloride 0.9 % bolus 1,000 mL (0 mLs Intravenous Stopped 07/18/21 1138)  ceFEPIme (MAXIPIME) 2 g in sodium chloride 0.9 % 100 mL IVPB (0 g Intravenous Stopped 07/18/21 1135)  sodium chloride 0.9 % bolus 1,000 mL (0 mLs Intravenous Stopped 07/18/21 1655)  iohexol (OMNIPAQUE) 350 MG/ML injection 100 mL (70 mLs Intravenous Contrast Given 07/18/21 0940)  potassium chloride 10 mEq in 100 mL IVPB (  0 mEq Intravenous Stopped 07/18/21 1655)  sodium chloride 0.9 % bolus 1,000 mL (1,000 mLs Intravenous New Bag/Given 07/18/21 1536)    Mobility non-ambulatory Low fall risk   Focused Assessments    R Recommendations: See Admitting Provider Note  Report given to:   Additional Notes:

## 2021-07-18 NOTE — ED Notes (Signed)
ED TO INPATIENT HANDOFF REPORT  ED Nurse Name and Phone #:   S Name/Age/Gender Lauren [name] 75 y.o. female Room/Bed: APA15/APA15  Code Status   Code Status: Full Code  Home/SNF/Other Nursing Home Patient oriented to: self, place, time and situation Is this baseline? Yes   Triage Complete: Triage complete  Chief Complaint Sepsis [facility]) [A41.9]  Triage Note Pt to the ED [facility] from [place] called out for post CPR.  EMS reports pelican was performing CPR prior to arrival but the pt was A&O x4 when EMS arrived.  CBG was 168. Pt is hypotensive with afib and history of the same.Pt had recent abdominal surgery and just arrived at [facility] yesterday.     Allergies Allergies  Allergen Reactions  . Codeine Itching  . Compazine [Prochlorperazine Edisylate]   . Prednisone Nausea And Vomiting  . Tetanus Toxoids Other (See Comments)    Knot like "hen egg" came up   . Vicodin [Hydrocodone-Acetaminophen] Itching    Level of Care/Admitting Diagnosis ED Disposition    ED Disposition  Admit   Condition  --   [place]: [facility] [id number]  Level of Care: ICU [6]  Covid Evaluation: Asymptomatic Screening Protocol (No Symptoms)  [facility]: [id number]  Admitting Physician: Morrison Old  Attending Physician: Morrison Old  Estimated length of stay: 3 - 4 days  Certification:: I certify this patient will need inpatient services for at least 2 midnights         B Medical/Surgery History Past Medical History:  Diagnosis Date  . A-fib ([place])   . Acute cholecystitis 12/06/2012  . Atrial fibrillation with RVR ([place]) 12/06/2012  . Diverticulosis   . Hypothyroidism   . Obesity   . Pancreatitis, acute 12/04/2012  . Periumbilical hernia    Past Surgical History:  Procedure Laterality Date  . BIOPSY  07/05/2021   Procedure: BIOPSY;  Surgeon: [name], DO;  [facility];  Service: Endoscopy;;  .  CHOLECYSTECTOMY N/A 03/25/2014   Procedure: LAPAROSCOPIC CHOLECYSTECTOMY WITH INTRAOPERATIVE CHOLANGIOGRAM;  Surgeon: Ralene Ok, MD;  Location: [place];  Service: General;  Laterality: N/A;  . COLONOSCOPY WITH PROPOFOL N/A 07/05/2021   Procedure: COLONOSCOPY WITH PROPOFOL;  Surgeon: [name], DO;  [facility];  Service: Endoscopy;  Laterality: N/A;  . ESOPHAGOGASTRODUODENOSCOPY (EGD) WITH PROPOFOL N/A 07/05/2021   Procedure: ESOPHAGOGASTRODUODENOSCOPY (EGD) WITH PROPOFOL;  Surgeon: [name], DO;  [facility];  Service: Endoscopy;  Laterality: N/A;  . PARTIAL COLECTOMY N/A 07/06/2021   Procedure: RIGHT HEMICOLECTOMY;  Surgeon: [name] Signs, MD;  Location: [facility];  Service: General;  Laterality: N/A;  . POLYPECTOMY  07/05/2021   Procedure: POLYPECTOMY;  Surgeon: [name], DO;  [facility];  Service: Endoscopy;;     A IV Location/Drains/Wounds Patient Lines/Drains/Airways Status    Active Line/Drains/Airways    Name Placement date Placement time Site Days   Peripheral IV 07/18/21 20 G 1" Left Antecubital 07/18/21  0800  Antecubital  less than 1   PICC Triple Lumen 07/18/21 PICC Right Brachial 38 cm 3 cm 07/18/21  1400  -- less than 1   External Urinary Catheter 07/18/21  0759  --  less than 1   Incision (Closed) 07/06/21 Abdomen Other (Comment) 07/06/21  1220  -- 12   Wound / Incision (Open or Dehisced) 07/15/21 (MASD) Moisture Associated Skin Damage Buttocks Right;Left;Medial 07/15/21  1247  Buttocks  3  Intake/Output Last 24 hours  Intake/Output Summary (Last 24 hours) at 07/18/2021 1546 Last data filed at 07/18/2021 1138 Gross per 24 hour  Intake 1600 ml  Output --  Net 1600 ml    Labs/Imaging Results for orders placed or performed during the hospital encounter of 07/18/21 (from the past 48 hour(s))  POC occult blood, ED     Status: Abnormal   Collection Time: 07/18/21  7:42 AM  Result Value Ref Range    Fecal Occult Bld POSITIVE (A) NEGATIVE  Comprehensive metabolic panel     Status: Abnormal   Collection Time: 07/18/21  8:30 AM  Result Value Ref Range   Sodium 129 (L) 135 - 145 mmol/L   Potassium 2.8 (L) 3.5 - 5.1 mmol/L   Chloride 94 (L) 98 - 111 mmol/L   CO2 24 22 - 32 mmol/L   Glucose, Bld 83 70 - 99 mg/dL    Comment: Glucose reference range applies only to samples taken after fasting for at least 8 hours.   BUN 17 8 - 23 mg/dL   Creatinine, Ser 1.58 (H) 0.44 - 1.00 mg/dL   Calcium 7.2 (L) 8.9 - 10.3 mg/dL   Total Protein 5.4 (L) 6.5 - 8.1 g/dL   Albumin 2.0 (L) 3.5 - 5.0 g/dL   AST 28 15 - 41 U/L   ALT 17 0 - 44 U/L   Alkaline Phosphatase 102 38 - 126 U/L   Total Bilirubin 1.7 (H) 0.3 - 1.2 mg/dL   GFR, Estimated 35 (L) >60 mL/min    Comment: (NOTE) Calculated using the CKD-EPI Creatinine Equation (2021)    Anion gap 11 5 - 15    Comment: Performed at [facility], [address] [place], [place] [address]  CBC WITH DIFFERENTIAL     Status: Abnormal   Collection Time: 07/18/21  8:30 AM  Result Value Ref Range   WBC 33.1 (H) 4.0 - 10.5 K/uL   RBC 4.39 3.87 - 5.11 MIL/uL   Hemoglobin 9.8 (L) 12.0 - 15.0 g/dL   HCT 34.2 (L) 36.0 - 46.0 %   MCV 77.9 (L) 80.0 - 100.0 fL   MCH 22.3 (L) 26.0 - 34.0 pg   MCHC 28.7 (L) 30.0 - 36.0 g/dL   RDW 28.7 (H) 11.5 - 15.5 %   Platelets 318 150 - 400 K/uL   nRBC 0.0 0.0 - 0.2 %   Neutrophils Relative % 87 %   Neutro Abs 28.8 (H) 1.7 - 7.7 K/uL   Lymphocytes Relative 4 %   Lymphs Abs 1.4 0.7 - 4.0 K/uL   Monocytes Relative 5 %   Monocytes Absolute 1.6 (H) 0.1 - 1.0 K/uL   Eosinophils Relative 0 %   Eosinophils Absolute 0.1 0.0 - 0.5 K/uL   Basophils Relative 1 %   Basophils Absolute 0.2 (H) 0.0 - 0.1 K/uL   RBC Morphology MACROCYTES PRESENT    Immature Granulocytes 3 %   Abs Immature Granulocytes 1.11 (H) 0.00 - 0.07 K/uL   Ovalocytes PRESENT    Stomatocytes PRESENT     Comment: Performed at [facility], [address],  [place], [place] [address]  Protime-INR     Status: Abnormal   Collection Time: 07/18/21  8:30 AM  Result Value Ref Range   Prothrombin Time 30.6 (H) 11.4 - 15.2 seconds   INR 2.9 (H) 0.8 - 1.2    Comment: (NOTE) INR goal varies based on device and disease states. Performed at [facility], [address] [place],  [place] [address]   APTT     Status: None   Collection Time: 07/18/21  8:30 AM  Result Value Ref Range   aPTT 33 24 - 36 seconds    Comment: Performed at [facility], [address] [place], [place] [address]  Lactic acid, plasma     Status: Abnormal   Collection Time: 07/18/21  8:35 AM  Result Value Ref Range   Lactic Acid, Venous 4.1 (HH) 0.5 - 1.9 mmol/L    Comment: CRITICAL RESULT CALLED TO, READ BACK BY AND VERIFIED WITH: LONG,L AT 9:10AM ON 07/18/21 BY [facility] Performed at [facility], [address] [place], [place] [address]   Blood culture (routine single)     Status: None (Preliminary result)   Collection Time: 07/18/21  8:35 AM   Specimen: Right Antecubital; Blood  Result Value Ref Range   Specimen Description RIGHT ANTECUBITAL    Special Requests      BOTTLES DRAWN AEROBIC AND ANAEROBIC Blood Culture adequate volume Performed at [facility], [address] [place], [place] [address]    Culture PENDING    Report Status PENDING   Type and screen     Status: None   Collection Time: 07/18/21  8:35 AM  Result Value Ref Range   ABO/RH(D) O POS    Antibody Screen NEG    Sample Expiration      07/21/2021,2359 Performed at [facility], [address] [place], [place] [address]   Resp Panel by RT-PCR (Flu A&B, Covid) Nasopharyngeal Swab     Status: Abnormal   Collection Time: 07/18/21  9:18 AM   Specimen: Nasopharyngeal Swab; Nasopharyngeal(NP) swabs in vial transport medium  Result Value Ref Range   SARS Coronavirus 2 by RT PCR POSITIVE (A) NEGATIVE    Comment: RESULT CALLED TO, READ BACK BY AND VERIFIED WITH: OAKLY,B AT 1425 BY HUFFINES,S ON  07/18/21. (NOTE) SARS-CoV-2 target nucleic acids are DETECTED.  The SARS-CoV-2 RNA is generally detectable in upper respiratory specimens during the acute phase of infection. Positive results are indicative of the presence of the identified virus, but do not rule out bacterial infection or co-infection with other pathogens not detected by the test. Clinical correlation with patient history and other diagnostic information is necessary to determine patient infection status. The expected result is Negative.  Fact Sheet for Patients: EntrepreneurPulse.com.au  Fact Sheet for Healthcare Providers: IncredibleEmployment.be  This test is not yet approved or cleared by the [place] FDA and  has been authorized for detection and/or diagnosis of SARS-CoV-2 by FDA under an Emergency Use Authorization (EUA).  This EUA will remain in effect (meaning this te st can be used) for the duration of  the COVID-19 declaration under Section 564(b)(1) of the Act, 21 U.S.C. section 360bbb-3(b)(1), unless the authorization is terminated or revoked sooner.     Influenza A by PCR NEGATIVE NEGATIVE   Influenza B by PCR NEGATIVE NEGATIVE    Comment: (NOTE) The Xpert Xpress SARS-CoV-2/FLU/RSV plus assay is intended as an aid in the diagnosis of influenza from Nasopharyngeal swab specimens and should not be used as a sole basis for treatment. Nasal washings and aspirates are unacceptable for Xpert Xpress SARS-CoV-2/FLU/RSV testing.  Fact Sheet for Patients: EntrepreneurPulse.com.au  Fact Sheet for Healthcare Providers: IncredibleEmployment.be  This test is not yet approved or cleared by the [place] FDA and has been authorized for detection and/or diagnosis of SARS-CoV-2 by FDA under an Emergency Use Authorization (EUA). This EUA will remain in effect (meaning this  test can be used) for the duration of the COVID-19  declaration under Section 564(b)(1) of the Act, 21 U.S.C. section 360bbb-3(b)(1), unless the authorization is terminated or revoked.  Performed at [facility], [address] [place], [place] [address]   Lactic acid, plasma     Status: Abnormal   Collection Time: 07/18/21 10:22 AM  Result Value Ref Range   Lactic Acid, Venous 2.3 (HH) 0.5 - 1.9 mmol/L    Comment: CRITICAL VALUE NOTED.  VALUE IS CONSISTENT WITH PREVIOUSLY REPORTED AND CALLED VALUE. Performed at [facility], [address] [place], [place] [address]   Blood Culture (routine x 2)     Status: None (Preliminary result)   Collection Time: 07/18/21 10:22 AM   Specimen: BLOOD  Result Value Ref Range   Specimen Description BLOOD RIGHT ANTECUBITAL    Special Requests      BOTTLES DRAWN AEROBIC AND ANAEROBIC Blood Culture adequate volume Performed at [facility], [address], [place], [place] [address]    Culture PENDING    Report Status PENDING    CT ABDOMEN PELVIS W CONTRAST  Result Date: 07/18/2021 CLINICAL DATA:  Abscess suspected EXAM: CT ABDOMEN AND PELVIS WITH CONTRAST TECHNIQUE: Multidetector CT imaging of the abdomen and pelvis was performed using the standard protocol following bolus administration of intravenous contrast. CONTRAST:  44m OMNIPAQUE IOHEXOL 350 MG/ML SOLN COMPARISON:  CT abdomen/pelvis 12/04/2012 FINDINGS: Lower chest: There are small bilateral pleural effusions with adjacent atelectasis. The imaged heart is unremarkable. Hepatobiliary: The liver is unremarkable. The gallbladder is surgically absent. Mild prominence of the common bile duct and intrahepatic ducts is likely due to the history of cholecystectomy. Pancreas: The pancreas is mildly atrophic. There are no focal lesions or contour abnormalities. There is no peripancreatic inflammatory change. Spleen: Unremarkable. Adrenals/Urinary Tract: The adrenals are unremarkable. There are no focal renal lesions or stones. There is no hydronephrosis or  hydroureter. The bladder is unremarkable. There is no excretion of contrast on the delayed images into the collecting system. Stomach/Bowel: The stomach is grossly unremarkable. The patient is status post right hemicolectomy with a sutures identified in the mid abdomen. 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 This communicates with a 7.4 cm by 5.7 cm by 6.9 cm collection in the anterior abdominal wall. There is mild thickening of multiple loops of small bowel in the lower mid abdomen. There are scattered colonic diverticula without evidence of acute diverticulitis. Vascular/Lymphatic: There is scattered calcified atherosclerotic plaque throughout the nonaneurysmal abdominal aorta. The main portal and splenic veins are patent. There is no abdominal or pelvic lymphadenopathy. Reproductive: The uterus and adnexa are unremarkable. Other: There is small volume ascites scattered throughout the abdomen. Haziness in the mesenteric fat likely reflects inflammatory change. There is no free intraperitoneal air. There is extensive gas tracking throughout the soft tissues of the abdomen into the right back and axilla. No other organized or drainable fluid collections are identified. Musculoskeletal: There is no acute osseous abnormality. IMPRESSION: 1. Status post right hemicolectomy with a suture site in the mid abdomen. 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 The fistula is in communication with a focal collection with layering debris measuring up to 7.4 cm in the abdominal wall consistent with abscess. 2. Extensive soft  tissue emphysema tracking throughout the body wall may be coming from the bowel, though necrotizing fasciitis would have a similar appearance.  This was discussed with Dr Roderic Palau as below. 3. Mild inflammatory change of loops of small bowel and scattered ascites throughout the abdomen, likely reactive. No free intraperitoneal air is identified. 4. Absence of contrast in the renal collecting systems on the delayed phase images; correlate with renal function. 5. Small bilateral pleural effusions. These results were called by telephone at the time of interpretation on 07/18/2021 at 10:34 am to provider [facility] ZAMMIT , who verbally acknowledged these results. Electronically Signed   By: Valetta Mole M.D.   On: 07/18/2021 10:38   DG Chest Portable 1 View  Result Date: 07/18/2021 CLINICAL DATA:  PICC line placement. EXAM: PORTABLE CHEST 1 VIEW COMPARISON:  July 18, 2021. FINDINGS: Stable cardiomediastinal silhouette. Interval placement of right-sided PICC line with distal tip in expected position of the SVC. Mild bibasilar subsegmental atelectasis is noted with small pleural effusions. Bony thorax is unremarkable. IMPRESSION: Interval placement of right-sided PICC line with distal tip in expected position of the SVC. Mild bibasilar subsegmental atelectasis with small pleural effusions. Electronically Signed   By: Marijo Conception M.D.   On: 07/18/2021 15:01   DG Chest Port 1 View  Result Date: 07/18/2021 CLINICAL DATA:  Question sepsis EXAM: PORTABLE CHEST 1 VIEW COMPARISON:  Multiple chest radiographs, most recently 07/12/2021. KUB, 07/12/2021. FINDINGS: Support lines: Interval removal of RIGHT upper extremity PICC. Overlying support needs. Cardiac silhouette enlargement, unchanged. Low lung volumes. The RIGHT lung is clear. LEFT basilar consolidation obscuring the cardiac apex, unchanged. No pleural effusion or pneumothorax. Appearance of subcutaneous gas tracking along the RIGHT chest wall, axilla and imaged RIGHT upper quadrant. No interval osseous abnormality. IMPRESSION: 1. Interval removal RIGHT upper extremity PICC 2. LEFT basilar atelectasis,  unchanged. 3. Overlying density with appearance of tracking subcutaneous gas along the RIGHT axilla and upper quadrant. Clinical correlation is recommended. Electronically Signed   By: Michaelle Birks M.D.   On: 07/18/2021 08:00   [place] EKG SITE RITE  Result Date: 07/18/2021 If [facility] image not attached, placement could not be confirmed due to current cardiac rhythm.   Pending Labs Unresulted Labs (From admission, onward)    Start     Ordered   07/18/21 0918  Urinalysis, Routine w reflex microscopic  (Septic presentation on arrival (screening labs, nursing and treatment orders for obvious sepsis))  ONCE - STAT,   STAT        07/18/21 0918   07/18/21 0741  Urinalysis, Routine w reflex microscopic  (Undifferentiated presentation (screening labs and basic nursing orders))  ONCE - STAT,   STAT        07/18/21 0741   07/18/21 0741  Urine Culture  (Undifferentiated presentation (screening labs and basic nursing orders))  ONCE - STAT,   STAT       Question:  Indication  Answer:  Sepsis   07/18/21 0741          Vitals/Pain Today's Vitals   07/18/21 1445 07/18/21 1500 07/18/21 1515 07/18/21 1530  BP:    (!) 88/74  Pulse: 94 73 (!) 56 61  Resp: 16 (!) '22 18 13  '$ Temp:      TempSrc:      SpO2: 94% 96% 90% 96%  Weight:      Height:      PainSc:        Isolation Precautions No active isolations  Medications  Medications  lactated ringers infusion (has no administration in time range)  potassium chloride 10 mEq in 100 mL IVPB (10 mEq Intravenous New Bag/Given 07/18/21 1543)  norepinephrine (LEVOPHED) '4mg'$  in 274m premix infusion (7 mcg/min Intravenous Rate/Dose Change 07/18/21 1530)  cefTRIAXone (ROCEPHIN) 2 g in sodium chloride 0.9 % 100 mL IVPB (2 g Intravenous New Bag/Given 07/18/21 1517)  clindamycin (CLEOCIN) IVPB 900 mg (has no administration in time range)  piperacillin-tazobactam (ZOSYN) IVPB 3.375 g (has no administration in time range)  vancomycin (VANCOREADY) IVPB 2000 mg/400 mL  (has no administration in time range)  sodium chloride 0.9 % bolus 500 mL (0 mLs Intravenous Stopped 07/18/21 0910)  sodium chloride 0.9 % bolus 1,000 mL (0 mLs Intravenous Stopped 07/18/21 1138)  ceFEPIme (MAXIPIME) 2 g in sodium chloride 0.9 % 100 mL IVPB (0 g Intravenous Stopped 07/18/21 1135)  sodium chloride 0.9 % bolus 1,000 mL (1,000 mLs Intravenous New Bag/Given 07/18/21 1154)  iohexol (OMNIPAQUE) 350 MG/ML injection 100 mL (70 mLs Intravenous Contrast Given 07/18/21 0940)  sodium chloride 0.9 % bolus 1,000 mL (1,000 mLs Intravenous New Bag/Given 07/18/21 1536)    Mobility non-ambulatory Low fall risk   Focused Assessments    R Recommendations: See Admitting Provider Note  Report given to:   Additional Notes:

## 2021-07-18 NOTE — ED Notes (Signed)
Vascular access at bedside.

## 2021-07-18 NOTE — ED Notes (Signed)
Awaiting PICC

## 2021-07-18 NOTE — Sepsis Progress Note (Signed)
Per secure chat with bedside RN Katie, full fluid bolus unable to be given due to difficulty in getting IV sites. Plan for PICC at 1300. Will continue to follow from [place].

## 2021-07-18 NOTE — ED Triage Notes (Signed)
Pt to the ED [facility] from [place] called out for post CPR.  EMS reports pelican was performing CPR prior to arrival but the pt was A&O x4 when EMS arrived.  CBG was 168. Pt is hypotensive with afib and history of the same.Pt had recent abdominal surgery and just arrived at [facility] yesterday.

## 2021-07-18 NOTE — ED Notes (Signed)
Pt. Is a hard stick. Pt. Is getting a PICC line at 1300.

## 2021-07-18 NOTE — ED Notes (Signed)
Nurse is aware of hypotension.

## 2021-07-18 NOTE — Consult Note (Signed)
[facility]   Patient name: Lauren [name] Admit date: 07/18/2021  DOB: 1946-11-20 LOS: 0  MRN: [id number] Consult date: 07/18/2021  Referring provider: Dr. Denton Brick CC: hypotension    History:  75 yo female was in hospital from 07/03/21 to 07/17/21 anemia from GI bleeding secondary to cecal mass.  She had Rt hemicolectomy on 07/06/21.  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  She was discharged to [facility].  She was sent to ER with hypotension.  She c/o nausea, abdominal pain, and right sided back pain with pain with right arm movement.  Found to be positive for COVID 19 which seems to be incidental.  Started on antibiotics, IV fluids, and pressors.  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 to suture through an umbilical hernia into abdominal wall with evidence for connection to overlying skin and this communicates with 7.4 x 5.7 x 6.9 cm fluid collection in the anterior abdominal wall, extensive gas tracking through soft tissues of the abdomen into Rt back and axilla  Micro:  COVID 8/24 >> positive Blood 8/24 >>   Lines:  Rt PICC 8/24 >>    Antibiotics:  Vancomycin 8/24 >> Zosyn 8/24 >> Clindamycin 8/24 >>    Consults:      Interim history:    Vital signs:  BP 105/69   Pulse 96   Temp 98.3 F (36.8 C) (Rectal)   Resp (!) 21   Ht '5\' 2"'$  (1.575 m)   Wt 114.9 kg   LMP 09/13/2016   SpO2 97%   BMI 46.33 kg/m   Intake/output:  No intake/output data recorded.   Physical exam:   General - pale Eyes - pupils reactive ENT - poor dentition Cardiac - irregular Chest - decreased breath sounds, no wheeze Abdomen - tender on right side, midline wound with drain packing Extremities - 3+ lymphedema Skin - no rashes Neuro - normal strength, moves extremities, follows commands Psych - normal mood and behavior  Best practice:   DVT - xarelto as outpt SUP - protonix Nutrition - NPO Mobility - bed rest   Assessment/plan:   Septic shock with abdominal wall fluid collection and concern for necrotizing fasciitis. - change antibiotics to vancomycin, zosyn, clindamycin - hospitalist team will discuss with surgery about transferring patient to [facility] for further surgical assessment - pressors to keep MAP > 65 - continue IV fluids  Metastatic colon cancer s/p Rt hemicolectomy. - keep NPO for now  COVID 19 positive. - seems to be incidental finding - monitor oxygenation  Chronic combined CHF, hx of A fib. - monitor hemodynamics  Anemia of critical illness and chronic disease. - f/u CBC - transfuse for Hb < 7  Hyponatremia, hypokalemia. - f/u BMET - replace electrolytes as needed  D/w Dr. Denton Brick  Resolved hospital problems:    Goals of care/Family discussions:  Code status: full  Labs:   CMP Latest Ref Rng & Units 07/18/2021 07/15/2021 07/14/2021  Glucose 70 - 99 mg/dL 83 82 76  BUN 8 - 23 mg/dL '17 14 14  '$ Creatinine 0.44 - 1.00 mg/dL 1.58(H) 0.94 1.10(H)  Sodium 135 - 145 mmol/L 129(L) 133(L) 133(L)  Potassium 3.5 - 5.1 mmol/L 2.8(L) 3.4(L) 3.3(L)  Chloride 98 - 111 mmol/L 94(L) 100 98  CO2 22 - 32 mmol/L '24 26 27  '$ Calcium 8.9 - 10.3 mg/dL 7.2(L) 7.8(L) 8.2(L)   Total Protein 6.5 - 8.1 g/dL 5.4(L) - -  Total Bilirubin 0.3 - 1.2 mg/dL 1.7(H) - -  Alkaline Phos 38 - 126 U/L 102 - -  AST 15 - 41 U/L 28 - -  ALT 0 - 44 U/L 17 - -    CBC Latest Ref Rng & Units 07/18/2021 07/15/2021 07/14/2021  WBC 4.0 - 10.5 K/uL 33.1(H) 10.3 10.4  Hemoglobin 12.0 - 15.0 g/dL 9.8(L) 8.6(L) 8.3(L)  Hematocrit 36.0 - 46.0 % 34.2(L) 30.3(L) 29.1(L)  Platelets 150 - 400 K/uL 318 264 223    ABG No results found for: PHART, PCO2ART, PO2ART, HCO3, TCO2, ACIDBASEDEF, O2SAT  CBG (last 3)  No results for input(s): GLUCAP in the last 72 hours.   Past surgical history:  She  has a past surgical history that includes Cholecystectomy (N/A, 03/25/2014); Colonoscopy with propofol (N/A, 07/05/2021); Esophagogastroduodenoscopy (egd) with propofol (N/A, 07/05/2021); biopsy (07/05/2021); polypectomy (07/05/2021); and Partial colectomy (N/A, 07/06/2021).  Social history:  She  reports that she has never smoked. She has never used smokeless tobacco. She reports that she does not drink alcohol and does not use drugs.   Review of systems:  Reviewed and negative  Family history:  Her family history includes Cancer in her father; Diabetes in her maternal grandfather; Heart disease in her maternal grandmother; Hypertension in her brother; Kidney disease in her maternal grandfather; Kidney failure in her brother.    Medications:   No current facility-administered medications on file prior to encounter.   Current Outpatient Medications on File Prior to Encounter  Medication Sig   acetaminophen (TYLENOL) 500 MG tablet Take 1,000 mg by mouth every 6 (six) hours as needed.   ferrous sulfate 325 (65 FE) MG EC tablet Take 1 tablet (325 mg total) by mouth 2 (two) times daily.   levothyroxine (SYNTHROID) 100 MCG tablet Take 1 tablet (100 mcg total) by mouth daily.   loperamide (IMODIUM) 2 MG capsule Take 1 capsule (2 mg total) by mouth every 6 (six) hours as needed for diarrhea or loose stools.    metoprolol succinate (TOPROL-XL) 25 MG 24 hr tablet Take 1 tablet (25 mg total) by mouth 2 (two) times daily.   ondansetron (ZOFRAN-ODT) 4 MG disintegrating tablet Take 1 tablet (4 mg total) by mouth every 6 (six) hours as needed for nausea.   oxyCODONE (OXY IR/ROXICODONE) 5 MG immediate release tablet Take 1 tablet (5 mg total) by mouth every 6 (six) hours as needed for moderate pain.   pantoprazole (PROTONIX) 40 MG tablet Take 1 tablet (40 mg total) by mouth daily at 12 noon.   polyethylene glycol (MIRALAX / GLYCOLAX) 17 g packet Take 17 g by mouth daily as needed for mild constipation.   potassium chloride SA (KLOR-CON) 20 MEQ tablet TAKE ONE (1) TABLET EACH DAY (Patient not taking: No sig reported)   rivaroxaban (XARELTO) 20 MG TABS tablet Take 1 tablet (20 mg total) by mouth daily with supper.   torsemide (DEMADEX) 20  MG tablet Take 1 tablet (20 mg total) by mouth daily.     Critical care time: 41 minutes  Chesley Mires, MD [place] Pager - [phone] 07/18/2021, 3:43 PM

## 2021-07-18 NOTE — ED Provider Notes (Signed)
[facility] EMERGENCY DEPARTMENT Provider Note   CSN: [id number] Arrival date & time: 07/18/21  [id number]     History Chief Complaint  Patient presents with   Near Syncope    Lauren [name] is a 75 y.o. female.  Patient brought over to the emergency department for near syncopal episode she feels weak.  She just got out of the hospital last night.  Patient is hypotensive  The history is provided by the patient and medical records. No language interpreter was used.  Near Syncope This is a new problem. The current episode started 6 to 12 hours ago. The problem occurs constantly. The problem has not changed since onset.Pertinent negatives include no chest pain, no abdominal pain and no headaches. Nothing aggravates the symptoms. Nothing relieves the symptoms. She has tried nothing for the symptoms.      Past Medical History:  Diagnosis Date   A-fib ([place])    Acute cholecystitis 12/06/2012   Atrial fibrillation with RVR ([place]) 12/06/2012   Diverticulosis    Hypothyroidism    Obesity    Pancreatitis, acute 0000000   Periumbilical hernia     Patient Active Problem List   Diagnosis Date Noted   Sepsis ([place]) 07/18/2021   Colon carcinoma ([place])    Morbid obesity with BMI of 50.0-59.9, adult ([place]) 07/04/2021   Chronic combined systolic and diastolic CHF (congestive heart failure) ([place]) 07/04/2021   Constipation    Colonic mass    Iron deficiency anemia 06/29/2021   Elevated troponin 06/29/2021   Potassium (K) deficiency 06/29/2021   Rectal pain 06/29/2021   Chronic acquired lymphedema 02/17/2018   Essential hypertension with goal blood pressure less than 130/80 02/17/2018   Anxiety state 03/24/2014   Gallstone pancreatitis 03/23/2014   Acute cholecystitis 12/06/2012   Atrial fibrillation with RVR ([place]) 12/06/2012   Pancreatitis, acute 12/04/2012   Hypothyroidism 12/04/2012   Obesity (BMI 35.0-39.9 without comorbidity) 12/04/2012    Past Surgical History:  Procedure  Laterality Date   BIOPSY  07/05/2021   Procedure: BIOPSY;  Surgeon: [name], DO;  [facility];  Service: Endoscopy;;   CHOLECYSTECTOMY N/A 03/25/2014   Procedure: LAPAROSCOPIC CHOLECYSTECTOMY WITH INTRAOPERATIVE CHOLANGIOGRAM;  Surgeon: Ralene Ok, MD;  Location: [place];  Service: General;  Laterality: N/A;   COLONOSCOPY WITH PROPOFOL N/A 07/05/2021   Procedure: COLONOSCOPY WITH PROPOFOL;  Surgeon: [name], DO;  [facility];  Service: Endoscopy;  Laterality: N/A;   ESOPHAGOGASTRODUODENOSCOPY (EGD) WITH PROPOFOL N/A 07/05/2021   Procedure: ESOPHAGOGASTRODUODENOSCOPY (EGD) WITH PROPOFOL;  Surgeon: [name], DO;  [facility];  Service: Endoscopy;  Laterality: N/A;   PARTIAL COLECTOMY N/A 07/06/2021   Procedure: RIGHT HEMICOLECTOMY;  Surgeon: [name] Signs, MD;  Location: [facility];  Service: General;  Laterality: N/A;   POLYPECTOMY  07/05/2021   Procedure: POLYPECTOMY;  Surgeon: [name], DO;  [facility];  Service: Endoscopy;;     OB History     Gravida  1   Para      Term      Preterm      AB      Living         SAB      IAB      Ectopic      Multiple      Live Births              Family History  Problem Relation Age of Onset   Cancer  Father        liver   Hypertension Brother    Heart disease Maternal Grandmother    Kidney disease Maternal Grandfather    Diabetes Maternal Grandfather    Kidney failure Brother     Social History   Tobacco Use   Smoking status: Never   Smokeless tobacco: Never  Vaping Use   Vaping Use: Never used  Substance Use Topics   Alcohol use: No   Drug use: No    Home Medications Prior to Admission medications   Medication Sig Start Date End Date Taking? Authorizing Provider  acetaminophen (TYLENOL) 500 MG tablet Take 1,000 mg by mouth every 6 (six) hours as needed.    [provider]  ferrous sulfate 325 (65 FE) MG EC tablet Take 1  tablet (325 mg total) by mouth 2 (two) times daily. 07/17/21 07/17/22  Kathie Dike, MD  levothyroxine (SYNTHROID) 100 MCG tablet Take 1 tablet (100 mcg total) by mouth daily. 11/10/19   Arnoldo Lenis, MD  loperamide (IMODIUM) 2 MG capsule Take 1 capsule (2 mg total) by mouth every 6 (six) hours as needed for diarrhea or loose stools. 07/17/21   Kathie Dike, MD  metoprolol succinate (TOPROL-XL) 25 MG 24 hr tablet Take 1 tablet (25 mg total) by mouth 2 (two) times daily. 07/17/21   Kathie Dike, MD  ondansetron (ZOFRAN-ODT) 4 MG disintegrating tablet Take 1 tablet (4 mg total) by mouth every 6 (six) hours as needed for nausea. 07/17/21   Kathie Dike, MD  oxyCODONE (OXY IR/ROXICODONE) 5 MG immediate release tablet Take 1 tablet (5 mg total) by mouth every 6 (six) hours as needed for moderate pain. 07/17/21   Kathie Dike, MD  pantoprazole (PROTONIX) 40 MG tablet Take 1 tablet (40 mg total) by mouth daily at 12 noon. 07/18/21   Kathie Dike, MD  polyethylene glycol (MIRALAX / GLYCOLAX) 17 g packet Take 17 g by mouth daily as needed for mild constipation. 07/17/21   Kathie Dike, MD  potassium chloride SA (KLOR-CON) 20 MEQ tablet TAKE ONE (1) TABLET EACH DAY Patient not taking: No sig reported 09/20/20   Arnoldo Lenis, MD  rivaroxaban (XARELTO) 20 MG TABS tablet Take 1 tablet (20 mg total) by mouth daily with supper. 07/17/21   Kathie Dike, MD  torsemide (DEMADEX) 20 MG tablet Take 1 tablet (20 mg total) by mouth daily. 07/17/21   Kathie Dike, MD    Allergies    Codeine, Compazine [prochlorperazine edisylate], Prednisone, Tetanus toxoids, and Vicodin [hydrocodone-acetaminophen]  Review of Systems   Review of Systems  Constitutional:  Positive for fatigue. Negative for appetite change.  HENT:  Negative for congestion, ear discharge and sinus pressure.   Eyes:  Negative for discharge.  Respiratory:  Negative for cough.   Cardiovascular:  Positive for near-syncope.  Negative for chest pain.  Gastrointestinal:  Negative for abdominal pain and diarrhea.  Genitourinary:  Negative for frequency and hematuria.  Musculoskeletal:  Negative for back pain.  Skin:  Negative for rash.  Neurological:  Negative for seizures and headaches.  Psychiatric/Behavioral:  Negative for hallucinations.    Physical Exam Updated Vital Signs BP (!) 87/49   Pulse 92   Temp 98.3 F (36.8 C) (Rectal)   Resp 17   Ht '5\' 2"'$  (1.575 m)   Wt 114.9 kg   LMP 09/13/2016   SpO2 97%   BMI 46.33 kg/m   Physical Exam Vitals and nursing note reviewed.  Constitutional:  Appearance: She is well-developed.  HENT:     Head: Normocephalic.     Nose: Nose normal.  Eyes:     General: No scleral icterus.    Conjunctiva/sclera: Conjunctivae normal.  Neck:     Thyroid: No thyromegaly.  Cardiovascular:     Rate and Rhythm: Normal rate and regular rhythm.     Heart sounds: No murmur heard.   No friction rub. No gallop.  Pulmonary:     Breath sounds: No stridor. No wheezing or rales.  Chest:     Chest wall: No tenderness.  Abdominal:     General: There is no distension.     Tenderness: There is abdominal tenderness. There is no rebound.  Musculoskeletal:        General: Normal range of motion.     Cervical back: Neck supple.  Lymphadenopathy:     Cervical: No cervical adenopathy.  Skin:    Findings: No erythema or rash.  Neurological:     Mental Status: She is oriented to person, place, and time.     Motor: No abnormal muscle tone.     Coordination: Coordination normal.  Psychiatric:        Behavior: Behavior normal.    ED Results / Procedures / Treatments   Labs (all labs ordered are listed, but only abnormal results are displayed) Labs Reviewed  LACTIC ACID, PLASMA - Abnormal; Notable for the following components:      Result Value   Lactic Acid, Venous 4.1 (*)    All other components within normal limits  LACTIC ACID, PLASMA - Abnormal; Notable for the  following components:   Lactic Acid, Venous 2.3 (*)    All other components within normal limits  COMPREHENSIVE METABOLIC PANEL - Abnormal; Notable for the following components:   Sodium 129 (*)    Potassium 2.8 (*)    Chloride 94 (*)    Creatinine, Ser 1.58 (*)    Calcium 7.2 (*)    Total Protein 5.4 (*)    Albumin 2.0 (*)    Total Bilirubin 1.7 (*)    GFR, Estimated 35 (*)    All other components within normal limits  CBC WITH DIFFERENTIAL/PLATELET - Abnormal; Notable for the following components:   WBC 33.1 (*)    Hemoglobin 9.8 (*)    HCT 34.2 (*)    MCV 77.9 (*)    MCH 22.3 (*)    MCHC 28.7 (*)    RDW 28.7 (*)    Neutro Abs 28.8 (*)    Monocytes Absolute 1.6 (*)    Basophils Absolute 0.2 (*)    Abs Immature Granulocytes 1.11 (*)    All other components within normal limits  PROTIME-INR - Abnormal; Notable for the following components:   Prothrombin Time 30.6 (*)    INR 2.9 (*)    All other components within normal limits  POC OCCULT BLOOD, ED - Abnormal; Notable for the following components:   Fecal Occult Bld POSITIVE (*)    All other components within normal limits  CULTURE, BLOOD (SINGLE)  CULTURE, BLOOD (ROUTINE X 2)  URINE CULTURE  RESP PANEL BY RT-PCR (FLU A&B, COVID) ARPGX2  APTT  URINALYSIS, ROUTINE W REFLEX MICROSCOPIC  URINALYSIS, ROUTINE W REFLEX MICROSCOPIC  TYPE AND SCREEN    EKG None  Radiology CT ABDOMEN PELVIS W CONTRAST  Result Date: 07/18/2021 CLINICAL DATA:  Abscess suspected EXAM: CT ABDOMEN AND PELVIS WITH CONTRAST TECHNIQUE: Multidetector CT imaging of the abdomen and pelvis was performed using  the standard protocol following bolus administration of intravenous contrast. CONTRAST:  51m OMNIPAQUE IOHEXOL 350 MG/ML SOLN COMPARISON:  CT abdomen/pelvis 12/04/2012 FINDINGS: Lower chest: There are small bilateral pleural effusions with adjacent atelectasis. The imaged heart is unremarkable. Hepatobiliary: The liver is unremarkable. The  gallbladder is surgically absent. Mild prominence of the common bile duct and intrahepatic ducts is likely due to the history of cholecystectomy. Pancreas: The pancreas is mildly atrophic. There are no focal lesions or contour abnormalities. There is no peripancreatic inflammatory change. Spleen: Unremarkable. Adrenals/Urinary Tract: The adrenals are unremarkable. There are no focal renal lesions or stones. There is no hydronephrosis or hydroureter. The bladder is unremarkable. There is no excretion of contrast on the delayed images into the collecting system. Stomach/Bowel: The stomach is grossly unremarkable. The patient is status post right hemicolectomy with a sutures identified in the mid abdomen. 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 This communicates with a 7.4 cm by 5.7 cm by 6.9 cm collection in the anterior abdominal wall. There is mild thickening of multiple loops of small bowel in the lower mid abdomen. There are scattered colonic diverticula without evidence of acute diverticulitis. Vascular/Lymphatic: There is scattered calcified atherosclerotic plaque throughout the nonaneurysmal abdominal aorta. The main portal and splenic veins are patent. There is no abdominal or pelvic lymphadenopathy. Reproductive: The uterus and adnexa are unremarkable. Other: There is small volume ascites scattered throughout the abdomen. Haziness in the mesenteric fat likely reflects inflammatory change. There is no free intraperitoneal air. There is extensive gas tracking throughout the soft tissues of the abdomen into the right back and axilla. No other organized or drainable fluid collections are identified. Musculoskeletal: There is no acute osseous abnormality. IMPRESSION: 1. Status post right hemicolectomy with a suture site in the mid abdomen. There is a fistulous connection between  the bowel adjacent to the suture into the overlying abdominal wall and to the skin surface through a pre-existing umbilical hernia. The fistula is in communication with a focal collection with layering debris measuring up to 7.4 cm in the abdominal wall consistent with abscess. 2. Extensive soft tissue emphysema tracking throughout the body wall may be coming from the bowel, though necrotizing fasciitis would have a similar appearance. This was discussed with Dr ZRoderic Palauas below. 3. Mild inflammatory change of loops of small bowel and scattered ascites throughout the abdomen, likely reactive. No free intraperitoneal air is identified. 4. Absence of contrast in the renal collecting systems on the delayed phase images; correlate with renal function. 5. Small bilateral pleural effusions. These results were called by telephone at the time of interpretation on 07/18/2021 at 10:34 am to provider [facility] , who verbally acknowledged these results. Electronically Signed   By: PValetta MoleM.D.   On: 07/18/2021 10:38   DG Chest Port 1 View  Result Date: 07/18/2021 CLINICAL DATA:  Question sepsis EXAM: PORTABLE CHEST 1 VIEW COMPARISON:  Multiple chest radiographs, most recently 07/12/2021. KUB, 07/12/2021. FINDINGS: Support lines: Interval removal of RIGHT upper extremity PICC. Overlying support needs. Cardiac silhouette enlargement, unchanged. Low lung volumes. The RIGHT lung is clear. LEFT basilar consolidation obscuring the cardiac apex, unchanged. No pleural effusion or pneumothorax. Appearance of subcutaneous gas tracking along the RIGHT chest wall, axilla and imaged RIGHT upper quadrant. No interval osseous abnormality. IMPRESSION: 1. Interval removal RIGHT upper extremity PICC 2. LEFT basilar atelectasis, unchanged.  3. Overlying density with appearance of tracking subcutaneous gas along the RIGHT axilla and upper quadrant. Clinical correlation is recommended. Electronically Signed   By: Michaelle Birks M.D.   On:  07/18/2021 08:00   [place] EKG SITE RITE  Result Date: 07/18/2021 If [facility] image not attached, placement could not be confirmed due to current cardiac rhythm.   Procedures Procedures   Medications Ordered in ED Medications  lactated ringers infusion (has no administration in time range)  metroNIDAZOLE (FLAGYL) IVPB 500 mg (has no administration in time range)  potassium chloride 10 mEq in 100 mL IVPB (has no administration in time range)  sodium chloride 0.9 % bolus 1,000 mL (has no administration in time range)  norepinephrine (LEVOPHED) '4mg'$  in 226m premix infusion (4 mcg/min Intravenous Rate/Dose Change 07/18/21 1205)  cefTRIAXone (ROCEPHIN) 2 g in sodium chloride 0.9 % 100 mL IVPB (has no administration in time range)  metroNIDAZOLE (FLAGYL) IVPB 500 mg (has no administration in time range)  sodium chloride 0.9 % bolus 500 mL (0 mLs Intravenous Stopped 07/18/21 0910)  sodium chloride 0.9 % bolus 1,000 mL (0 mLs Intravenous Stopped 07/18/21 1138)  ceFEPIme (MAXIPIME) 2 g in sodium chloride 0.9 % 100 mL IVPB (0 g Intravenous Stopped 07/18/21 1135)  sodium chloride 0.9 % bolus 1,000 mL (1,000 mLs Intravenous New Bag/Given 07/18/21 1154)  iohexol (OMNIPAQUE) 350 MG/ML injection 100 mL (70 mLs Intravenous Contrast Given 07/18/21 0940)    ED Course  I have reviewed the triage vital signs and the nursing notes.  Pertinent labs & imaging results that were available during my care of the patient were reviewed by me and considered in my medical decision making (see chart for details).   CRITICAL CARE Performed by: JMilton FergusonTotal critical care time: 482mutes Critical care time was exclusive of separately billable procedures and treating other patients. Critical care was necessary to treat or prevent imminent or life-threatening deterioration. Critical care was time spent personally by me on the following activities: development of treatment plan with patient and/or surrogate as well as  nursing, discussions with consultants, evaluation of patient's response to treatment, examination of patient, obtaining history from patient or surrogate, ordering and performing treatments and interventions, ordering and review of laboratory studies, ordering and review of radiographic studies, pulse oximetry and re-evaluation of patient's condition. Patient is septic.  CT scan shows abdominal wall abscess with fistula between the bowel adjacent to the suture involving the abdominal wall.  Patient seen by general surgery will take care of the patient along with hospitalist MDM Rules/Calculators/A&P                           Sepsis secondary to recent right hemicolectomy with abscess to abdomen and fistula.  Hospital admission with surgery consult and critical care consult Final Clinical Impression(s) / ED Diagnoses Final diagnoses:  [facility]   Rx / DC Orders ED Discharge Orders     None        ZaMilton FergusonMD 07/20/21 [id number]

## 2021-07-18 NOTE — ED Notes (Signed)
Dr Roderic Palau informed of difficult IV stick.  Team called for PICC line.

## 2021-07-18 NOTE — Consult Note (Signed)
Reason for Consult/Chief Complaint:colocutaneous fistula Consultant: Carson Myrtle, MD  Lauren [name] is an 75 y.o. female.   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 Initial presentation to OSH with bleeding cecal mass and ~2w ago underwent R hemicolectomy with primary anastomosis with final pathology notable for metastatic adenocarcinoma. Post-operatively, she presumably had an anastomotic leak that decompressed via the midline incision which has since been opened completely. She is unable to provide any meaningful history, so history is obtained from chart review. Incidentally, she is COVID+. She is alert, on 6 of levophed, and otherwise non-toxic appearing.  Past Medical History:  Diagnosis Date   A-fib [facility])    Acute cholecystitis 12/06/2012   Atrial fibrillation with RVR ([place]) 12/06/2012   Diverticulosis    Hypothyroidism    Obesity    Pancreatitis, acute 0000000   Periumbilical hernia     Past Surgical History:  Procedure Laterality Date   BIOPSY  07/05/2021   Procedure: BIOPSY;  Surgeon: [name], DO;  [facility];  Service: Endoscopy;;   CHOLECYSTECTOMY N/A 03/25/2014   Procedure: LAPAROSCOPIC CHOLECYSTECTOMY WITH INTRAOPERATIVE CHOLANGIOGRAM;  Surgeon: Ralene Ok, MD;  Location: [place];  Service: General;  Laterality: N/A;   COLONOSCOPY WITH PROPOFOL N/A 07/05/2021   Procedure: COLONOSCOPY WITH PROPOFOL;  Surgeon: [name], DO;  [facility];  Service: Endoscopy;  Laterality: N/A;   ESOPHAGOGASTRODUODENOSCOPY (EGD) WITH PROPOFOL N/A 07/05/2021   Procedure: ESOPHAGOGASTRODUODENOSCOPY (EGD) WITH PROPOFOL;  Surgeon: [name], DO;  [facility];  Service: Endoscopy;  Laterality: N/A;   PARTIAL COLECTOMY N/A 07/06/2021   Procedure: RIGHT HEMICOLECTOMY;  Surgeon: [name] Signs, MD;  Location: [facility];  Service: General;  Laterality: N/A;   POLYPECTOMY  07/05/2021   Procedure: POLYPECTOMY;  Surgeon:  [name], DO;  [facility];  Service: Endoscopy;;    Family History  Problem Relation Age of Onset   Cancer Father        liver   Hypertension Brother    Heart disease Maternal Grandmother    Kidney disease Maternal Grandfather    Diabetes Maternal Grandfather    Kidney failure Brother     Social History:  reports that she has never smoked. She has never used smokeless tobacco. She reports that she does not drink alcohol and does not use drugs.  Allergies:  Allergies  Allergen Reactions   Codeine Itching   Compazine [Prochlorperazine Edisylate]    Prednisone Nausea And Vomiting   Tetanus Toxoids Other (See Comments)    Knot like "hen egg" came up    Vicodin [Hydrocodone-Acetaminophen] Itching    Medications: I have reviewed the patient's current medications.  Results for orders placed or performed during the hospital encounter of 07/18/21 (from the past 48 hour(s))  POC occult blood, ED     Status: Abnormal   Collection Time: 07/18/21  7:42 AM  Result Value Ref Range   Fecal Occult Bld POSITIVE (A) NEGATIVE  Comprehensive metabolic panel     Status: Abnormal   Collection Time: 07/18/21  8:30 AM  Result Value Ref Range   Sodium 129 (L) 135 - 145 mmol/L   Potassium 2.8 (L) 3.5 - 5.1 mmol/L   Chloride 94 (L) 98 - 111 mmol/L   CO2 24 22 - 32 mmol/L   Glucose, Bld 83 70 - 99 mg/dL    Comment: Glucose reference range applies only to samples taken after fasting for at least 8 hours.  BUN 17 8 - 23 mg/dL   Creatinine, Ser 1.58 (H) 0.44 - 1.00 mg/dL   Calcium 7.2 (L) 8.9 - 10.3 mg/dL   Total Protein 5.4 (L) 6.5 - 8.1 g/dL   Albumin 2.0 (L) 3.5 - 5.0 g/dL   AST 28 15 - 41 U/L   ALT 17 0 - 44 U/L   Alkaline Phosphatase 102 38 - 126 U/L   Total Bilirubin 1.7 (H) 0.3 - 1.2 mg/dL   GFR, Estimated 35 (L) >60 mL/min    Comment: (NOTE) Calculated using the CKD-EPI Creatinine Equation (2021)    Anion gap 11 5 - 15    Comment: Performed at [facility], [address] [place], [place] [address]  CBC WITH DIFFERENTIAL     Status: Abnormal   Collection Time: 07/18/21  8:30 AM  Result Value Ref Range   WBC 33.1 (H) 4.0 - 10.5 K/uL   RBC 4.39 3.87 - 5.11 MIL/uL   Hemoglobin 9.8 (L) 12.0 - 15.0 g/dL   HCT 34.2 (L) 36.0 - 46.0 %   MCV 77.9 (L) 80.0 - 100.0 fL   MCH 22.3 (L) 26.0 - 34.0 pg   MCHC 28.7 (L) 30.0 - 36.0 g/dL   RDW 28.7 (H) 11.5 - 15.5 %   Platelets 318 150 - 400 K/uL   nRBC 0.0 0.0 - 0.2 %   Neutrophils Relative % 87 %   Neutro Abs 28.8 (H) 1.7 - 7.7 K/uL   Lymphocytes Relative 4 %   Lymphs Abs 1.4 0.7 - 4.0 K/uL   Monocytes Relative 5 %   Monocytes Absolute 1.6 (H) 0.1 - 1.0 K/uL   Eosinophils Relative 0 %   Eosinophils Absolute 0.1 0.0 - 0.5 K/uL   Basophils Relative 1 %   Basophils Absolute 0.2 (H) 0.0 - 0.1 K/uL   RBC Morphology MACROCYTES PRESENT    Immature Granulocytes 3 %   Abs Immature Granulocytes 1.11 (H) 0.00 - 0.07 K/uL   Ovalocytes PRESENT    Stomatocytes PRESENT     Comment: Performed at [facility], [address], [place], [place] [address]  Protime-INR     Status: Abnormal   Collection Time: 07/18/21  8:30 AM  Result Value Ref Range   Prothrombin Time 30.6 (H) 11.4 - 15.2 seconds   INR 2.9 (H) 0.8 - 1.2    Comment: (NOTE) INR goal varies based on device and disease states. Performed at [facility], [address] [place], [place] [address]   APTT     Status: None   Collection Time: 07/18/21  8:30 AM  Result Value Ref Range   aPTT 33 24 - 36 seconds    Comment: Performed at [facility], [address], [place], [place] [address]  Lactic acid, plasma     Status: Abnormal   Collection Time: 07/18/21  8:35 AM  Result Value Ref Range   Lactic Acid, Venous 4.1 (HH) 0.5 - 1.9 mmol/L    Comment: CRITICAL RESULT CALLED TO, READ BACK BY AND VERIFIED WITH: LONG,L AT 9:10AM ON 07/18/21 BY [facility] Performed at [facility], [address] [place],  [address]   Blood culture (routine  single)     Status: None (Preliminary result)   Collection Time: 07/18/21  8:35 AM   Specimen: Right Antecubital; Blood  Result Value Ref Range   Specimen Description RIGHT ANTECUBITAL    Special Requests      BOTTLES DRAWN AEROBIC AND ANAEROBIC Blood Culture adequate volume Performed at [facility], [address], [place], [place] [address]    Culture PENDING    Report Status PENDING   Type and screen     Status: None   Collection Time: 07/18/21  8:35 AM  Result Value Ref Range   ABO/RH(D) O POS    Antibody Screen NEG    Sample Expiration      07/21/2021,2359 Performed at [facility], [address], [place], [place] [address]   Procalcitonin - Baseline     Status: None   Collection Time: 07/18/21  8:36 AM  Result Value Ref Range   Procalcitonin 1.06 ng/mL    Comment:        Interpretation: PCT > 0.5 ng/mL and <= 2 ng/mL: Systemic infection (sepsis) is possible, but other conditions are known to elevate PCT as well. (NOTE)       Sepsis PCT Algorithm           Lower Respiratory Tract                                      Infection PCT Algorithm    ----------------------------     ----------------------------         PCT < 0.25 ng/mL                PCT < 0.10 ng/mL          Strongly encourage             Strongly discourage   discontinuation of antibiotics    initiation of antibiotics    ----------------------------     -----------------------------       PCT 0.25 - 0.50 ng/mL            PCT 0.10 - 0.25 ng/mL               OR       >80% decrease in PCT            Discourage initiation of                                            antibiotics      Encourage discontinuation           of antibiotics    ----------------------------     -----------------------------         PCT >= 0.50 ng/mL              PCT 0.26 - 0.50 ng/mL                AND       <80% decrease in PCT             Encourage initiation of                                             antibiotics        Encourage continuation           of antibiotics    ----------------------------     -----------------------------        PCT >= 0.50 ng/mL                  PCT >  0.50 ng/mL               AND         increase in PCT                  Strongly encourage                                      initiation of antibiotics    Strongly encourage escalation           of antibiotics                                     -----------------------------                                           PCT <= 0.25 ng/mL                                                 OR                                        > 80% decrease in PCT                                      Discontinue / Do not initiate                                             antibiotics  Performed at [facility], [address], [place], [place] [address]   Resp Panel by RT-PCR (Flu A&B, Covid) Nasopharyngeal Swab     Status: Abnormal   Collection Time: 07/18/21  9:18 AM   Specimen: Nasopharyngeal Swab; Nasopharyngeal(NP) swabs in vial transport medium  Result Value Ref Range   SARS Coronavirus 2 by RT PCR POSITIVE (A) NEGATIVE    Comment: RESULT CALLED TO, READ BACK BY AND VERIFIED WITH: OAKLY,B AT 1425 BY HUFFINES,S ON 07/18/21. (NOTE) SARS-CoV-2 target nucleic acids are DETECTED.  The SARS-CoV-2 RNA is generally detectable in upper respiratory specimens during the acute phase of infection. Positive results are indicative of the presence of the identified virus, but do not rule out bacterial infection or co-infection with other pathogens not detected by the test. Clinical correlation with patient history and other diagnostic information is necessary to determine patient infection status. The expected result is Negative.  Fact Sheet for Patients: EntrepreneurPulse.com.au  Fact Sheet for Healthcare Providers: IncredibleEmployment.be  This test is not yet approved or cleared by the [place] FDA and   has been authorized for detection and/or diagnosis of SARS-CoV-2 by FDA under an Emergency Use Authorization (EUA).  This EUA will remain in effect (meaning this te st can be used) for the duration of  the COVID-19 declaration under Section 564(b)(1) of the Act,  21 U.S.C. section 360bbb-3(b)(1), unless the authorization is terminated or revoked sooner.     Influenza A by PCR NEGATIVE NEGATIVE   Influenza B by PCR NEGATIVE NEGATIVE    Comment: (NOTE) The Xpert Xpress SARS-CoV-2/FLU/RSV plus assay is intended as an aid in the diagnosis of influenza from Nasopharyngeal swab specimens and should not be used as a sole basis for treatment. Nasal washings and aspirates are unacceptable for Xpert Xpress SARS-CoV-2/FLU/RSV testing.  Fact Sheet for Patients: EntrepreneurPulse.com.au  Fact Sheet for Healthcare Providers: IncredibleEmployment.be  This test is not yet approved or cleared by the [place] FDA and has been authorized for detection and/or diagnosis of SARS-CoV-2 by FDA under an Emergency Use Authorization (EUA). This EUA will remain in effect (meaning this test can be used) for the duration of the COVID-19 declaration under Section 564(b)(1) of the Act, 21 U.S.C. section 360bbb-3(b)(1), unless the authorization is terminated or revoked.  Performed at [facility], [address] [place], [place] [address]   Lactic acid, plasma     Status: Abnormal   Collection Time: 07/18/21 10:22 AM  Result Value Ref Range   Lactic Acid, Venous 2.3 (HH) 0.5 - 1.9 mmol/L    Comment: CRITICAL VALUE NOTED.  VALUE IS CONSISTENT WITH PREVIOUSLY REPORTED AND CALLED VALUE. Performed at [facility], [address], [place], [place] [address]   Blood Culture (routine x 2)     Status: None (Preliminary result)   Collection Time: 07/18/21 10:22 AM   Specimen: BLOOD  Result Value Ref Range   Specimen Description BLOOD RIGHT ANTECUBITAL    Special Requests       BOTTLES DRAWN AEROBIC AND ANAEROBIC Blood Culture adequate volume Performed at [facility], [address], [place], [place] [address]    Culture PENDING    Report Status PENDING   Glucose, capillary     Status: None   Collection Time: 07/18/21  8:55 PM  Result Value Ref Range   Glucose-Capillary 77 70 - 99 mg/dL    Comment: Glucose reference range applies only to samples taken after fasting for at least 8 hours.    CT ABDOMEN PELVIS W CONTRAST  Result Date: 07/18/2021 CLINICAL DATA:  Abscess suspected EXAM: CT ABDOMEN AND PELVIS WITH CONTRAST TECHNIQUE: Multidetector CT imaging of the abdomen and pelvis was performed using the standard protocol following bolus administration of intravenous contrast. CONTRAST:  42m OMNIPAQUE IOHEXOL 350 MG/ML SOLN COMPARISON:  CT abdomen/pelvis 12/04/2012 FINDINGS: Lower chest: There are small bilateral pleural effusions with adjacent atelectasis. The imaged heart is unremarkable. Hepatobiliary: The liver is unremarkable. The gallbladder is surgically absent. Mild prominence of the common bile duct and intrahepatic ducts is likely due to the history of cholecystectomy. Pancreas: The pancreas is mildly atrophic. There are no focal lesions or contour abnormalities. There is no peripancreatic inflammatory change. Spleen: Unremarkable. Adrenals/Urinary Tract: The adrenals are unremarkable. There are no focal renal lesions or stones. There is no hydronephrosis or hydroureter. The bladder is unremarkable. There is no excretion of contrast on the delayed images into the collecting system. Stomach/Bowel: The stomach is grossly unremarkable. The patient is status post right hemicolectomy with a sutures identified in the mid abdomen. 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 This communicates with a 7.4 cm by 5.7 cm by 6.9 cm collection in  the anterior abdominal wall. There is mild thickening of multiple  loops of small bowel in the lower mid abdomen. There are scattered colonic diverticula without evidence of acute diverticulitis. Vascular/Lymphatic: There is scattered calcified atherosclerotic plaque throughout the nonaneurysmal abdominal aorta. The main portal and splenic veins are patent. There is no abdominal or pelvic lymphadenopathy. Reproductive: The uterus and adnexa are unremarkable. Other: There is small volume ascites scattered throughout the abdomen. Haziness in the mesenteric fat likely reflects inflammatory change. There is no free intraperitoneal air. There is extensive gas tracking throughout the soft tissues of the abdomen into the right back and axilla. No other organized or drainable fluid collections are identified. Musculoskeletal: There is no acute osseous abnormality. IMPRESSION: 1. Status post right hemicolectomy with a suture site in the mid abdomen. 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 The fistula is in communication with a focal collection with layering debris measuring up to 7.4 cm in the abdominal wall consistent with abscess. 2. Extensive soft tissue emphysema tracking throughout the body wall may be coming from the bowel, though necrotizing fasciitis would have a similar appearance. This was discussed with Dr Roderic Palau as below. 3. Mild inflammatory change of loops of small bowel and scattered ascites throughout the abdomen, likely reactive. No free intraperitoneal air is identified. 4. Absence of contrast in the renal collecting systems on the delayed phase images; correlate with renal function. 5. Small bilateral pleural effusions. These results were called by telephone at the time of interpretation on 07/18/2021 at 10:34 am to provider [facility] ZAMMIT , who verbally acknowledged these  results. Electronically Signed   By: Valetta Mole M.D.   On: 07/18/2021 10:38   DG Chest Portable 1 View  Result Date: 07/18/2021 CLINICAL DATA:  PICC line placement. EXAM: PORTABLE CHEST 1 VIEW COMPARISON:  July 18, 2021. FINDINGS: Stable cardiomediastinal silhouette. Interval placement of right-sided PICC line with distal tip in expected position of the SVC. Mild bibasilar subsegmental atelectasis is noted with small pleural effusions. Bony thorax is unremarkable. IMPRESSION: Interval placement of right-sided PICC line with distal tip in expected position of the SVC. Mild bibasilar subsegmental atelectasis with small pleural effusions. Electronically Signed   By: Marijo Conception M.D.   On: 07/18/2021 15:01   DG Chest Port 1 View  Result Date: 07/18/2021 CLINICAL DATA:  Question sepsis EXAM: PORTABLE CHEST 1 VIEW COMPARISON:  Multiple chest radiographs, most recently 07/12/2021. KUB, 07/12/2021. FINDINGS: Support lines: Interval removal of RIGHT upper extremity PICC. Overlying support needs. Cardiac silhouette enlargement, unchanged. Low lung volumes. The RIGHT lung is clear. LEFT basilar consolidation obscuring the cardiac apex, unchanged. No pleural effusion or pneumothorax. Appearance of subcutaneous gas tracking along the RIGHT chest wall, axilla and imaged RIGHT upper quadrant. No interval osseous abnormality. IMPRESSION: 1. Interval removal RIGHT upper extremity PICC 2. LEFT basilar atelectasis, unchanged. 3. Overlying density with appearance of tracking subcutaneous gas along the RIGHT axilla and upper quadrant. Clinical correlation is recommended. Electronically Signed   By: Michaelle Birks M.D.   On: 07/18/2021 08:00   [place] EKG SITE RITE  Result Date: 07/18/2021 If [facility] image not attached, placement could not be confirmed due to current cardiac rhythm.   ROS 10 point review of systems is negative except as listed above in HPI.   Physical Exam Blood pressure 91/68, pulse 88, temperature  98.7 F (37.1 C), temperature source Oral, resp. rate (!) 22, height '5\' 2"'$  (1.575 m),  weight 116.2 kg, last menstrual period 09/13/2016, SpO2 96 %, unknown if currently breastfeeding. Constitutional: well-developed, well-nourished HEENT: pupils equal, round, reactive to light, 41m b/l, moist conjunctiva, external inspection of ears and nose normal, hearing intact Oropharynx: normal oropharyngeal mucosa, normal dentition Neck: no thyromegaly, trachea midline, no midline cervical tenderness to palpation Chest: breath sounds equal bilaterally, normal respiratory effort, no midline or lateral chest wall tenderness to palpation/deformity Abdomen: soft, appropriate peri-wound tenderness, midline laparotomy opened with frank feculent drainage present, no bruising, no hepatosplenomegaly GU: normal female genitalia  Back: no wounds, no thoracic/lumbar spine tenderness to palpation, no thoracic/lumbar spine stepoffs Rectal: deferred Extremities: 2+ radial and pedal pulses bilaterally, motor and sensation intact to bilateral UE and LE, no peripheral edema MSK: unable to assess gait/station, no clubbing/cyanosis of fingers/toes, normal ROM of all four extremities Skin: warm, dry, no rashes Psych: normal memory, normal mood/affect    Assessment/Plan: 89F with colocutaneous fistula after R hemicolectomy at OSH - Eakins pouch applied to aid in collection of output. Quantify output per shift as best as possible. WOC c/s for assistance with wound management.  - PICC/TPN for nutritional optimization as albumin is 2. NPO for now. - Empiric abx started by primary team: merrem, zosyn, eraxis, vanc. Given shock and likely septic source, broad coverage is appropriate, but could consider d/c of merrem.. Recommend bcx given PICC in place prior to admission. Continue gram negative and anaerobic coverage until source control achieved.    AJesusita Oka MD General and [facility]

## 2021-07-19 ENCOUNTER — Inpatient Hospital Stay (HOSPITAL_COMMUNITY): Payer: Medicare HMO

## 2021-07-19 DIAGNOSIS — R6521 Severe sepsis with septic shock: Secondary | ICD-10-CM | POA: Diagnosis not present

## 2021-07-19 DIAGNOSIS — A419 Sepsis, unspecified organism: Secondary | ICD-10-CM | POA: Diagnosis not present

## 2021-07-19 LAB — CBC
HCT: 28.6 % — ABNORMAL LOW (ref 36.0–46.0)
Hemoglobin: 8.5 g/dL — ABNORMAL LOW (ref 12.0–15.0)
MCH: 22.3 pg — ABNORMAL LOW (ref 26.0–34.0)
MCHC: 29.7 g/dL — ABNORMAL LOW (ref 30.0–36.0)
MCV: 74.9 fL — ABNORMAL LOW (ref 80.0–100.0)
Platelets: 304 10*3/uL (ref 150–400)
RBC: 3.82 MIL/uL — ABNORMAL LOW (ref 3.87–5.11)
RDW: 29 % — ABNORMAL HIGH (ref 11.5–15.5)
WBC: 26.4 10*3/uL — ABNORMAL HIGH (ref 4.0–10.5)
nRBC: 0 % (ref 0.0–0.2)

## 2021-07-19 LAB — MAGNESIUM
Magnesium: 1.3 mg/dL — ABNORMAL LOW (ref 1.7–2.4)
Magnesium: 2.3 mg/dL (ref 1.7–2.4)

## 2021-07-19 LAB — COMPREHENSIVE METABOLIC PANEL
ALT: 17 U/L (ref 0–44)
AST: 25 U/L (ref 15–41)
Albumin: 1.5 g/dL — ABNORMAL LOW (ref 3.5–5.0)
Alkaline Phosphatase: 76 U/L (ref 38–126)
Anion gap: 10 (ref 5–15)
BUN: 17 mg/dL (ref 8–23)
CO2: 22 mmol/L (ref 22–32)
Calcium: 6.9 mg/dL — ABNORMAL LOW (ref 8.9–10.3)
Chloride: 99 mmol/L (ref 98–111)
Creatinine, Ser: 1.36 mg/dL — ABNORMAL HIGH (ref 0.44–1.00)
GFR, Estimated: 41 mL/min — ABNORMAL LOW (ref >60)
Glucose, Bld: 68 mg/dL — ABNORMAL LOW (ref 70–99)
Potassium: 3.2 mmol/L — ABNORMAL LOW (ref 3.5–5.1)
Sodium: 131 mmol/L — ABNORMAL LOW (ref 135–145)
Total Bilirubin: 1.1 mg/dL (ref 0.3–1.2)
Total Protein: 4.5 g/dL — ABNORMAL LOW (ref 6.5–8.1)

## 2021-07-19 LAB — GLUCOSE, CAPILLARY
Glucose-Capillary: 107 mg/dL — ABNORMAL HIGH (ref 70–99)
Glucose-Capillary: 108 mg/dL — ABNORMAL HIGH (ref 70–99)
Glucose-Capillary: 79 mg/dL (ref 70–99)
Glucose-Capillary: 81 mg/dL (ref 70–99)
Glucose-Capillary: 83 mg/dL (ref 70–99)
Glucose-Capillary: 93 mg/dL (ref 70–99)
Glucose-Capillary: 94 mg/dL (ref 70–99)

## 2021-07-19 LAB — TYPE AND SCREEN
ABO/RH(D): O POS
Antibody Screen: NEGATIVE

## 2021-07-19 LAB — PHOSPHORUS: Phosphorus: 3.6 mg/dL (ref 2.5–4.6)

## 2021-07-19 LAB — TRIGLYCERIDES: Triglycerides: 73 mg/dL (ref <150)

## 2021-07-19 MED ORDER — VANCOMYCIN HCL 750 MG/150ML IV SOLN: 750.0000 mg | INTRAVENOUS | Status: DC

## 2021-07-19 MED ORDER — INSULIN ASPART 100 UNIT/ML IJ SOLN
0.0000 [IU] | INTRAMUSCULAR | Status: DC
Start: 2021-07-19 — End: 2021-07-21

## 2021-07-19 MED ORDER — LACTATED RINGERS IV SOLN: INTRAVENOUS | Status: DC

## 2021-07-19 MED ORDER — SODIUM CHLORIDE 0.9 % IV SOLN
200.0000 mg | Freq: Once | INTRAVENOUS | Status: AC
  Administered 2021-07-19: 200 mg via INTRAVENOUS
  Filled 2021-07-19: qty 40

## 2021-07-19 MED ORDER — MAGNESIUM SULFATE 4 GM/100ML IV SOLN
4.0000 g | Freq: Once | INTRAVENOUS | Status: AC
  Administered 2021-07-19: 4 g via INTRAVENOUS
  Filled 2021-07-19: qty 100

## 2021-07-19 MED ORDER — POTASSIUM CHLORIDE 10 MEQ/50ML IV SOLN
10.0000 meq | INTRAVENOUS | Status: AC
  Administered 2021-07-19 (×5): 10 meq via INTRAVENOUS
  Filled 2021-07-19: qty 50

## 2021-07-19 MED ORDER — SODIUM CHLORIDE 0.9 % IV SOLN
100.0000 mg | Freq: Every day | INTRAVENOUS | Status: AC
  Administered 2021-07-20 – 2021-07-21 (×2): 100 mg via INTRAVENOUS
  Filled 2021-07-19: qty 100
  Filled 2021-07-19: qty 20

## 2021-07-19 MED ORDER — MAGNESIUM SULFATE 2 GM/50ML IV SOLN
2.0000 g | Freq: Once | INTRAVENOUS | Status: AC
  Administered 2021-07-19: 2 g via INTRAVENOUS
  Filled 2021-07-19: qty 50

## 2021-07-19 MED ORDER — PIPERACILLIN-TAZOBACTAM 3.375 G IVPB
3.3750 g | Freq: Three times a day (TID) | INTRAVENOUS | Status: DC
  Administered 2021-07-19 – 2021-07-31 (×34): 3.375 g via INTRAVENOUS
  Filled 2021-07-19 (×35): qty 50

## 2021-07-19 MED ORDER — SODIUM CHLORIDE 0.9% FLUSH
10.0000 mL | Freq: Two times a day (BID) | INTRAVENOUS | Status: DC
  Administered 2021-07-19 – 2021-07-21 (×4): 10 mL
  Administered 2021-07-21: 20 mL
  Administered 2021-07-22 – 2021-07-24 (×6): 10 mL
  Administered 2021-07-25: 30 mL
  Administered 2021-07-25 – 2021-07-27 (×3): 10 mL
  Administered 2021-07-27: 20 mL
  Administered 2021-07-27 – 2021-08-03 (×15): 10 mL
  Administered 2021-08-04: 30 mL
  Administered 2021-08-05 – 2021-08-08 (×8): 10 mL
  Administered 2021-08-09: 20 mL
  Administered 2021-08-09 – 2021-08-11 (×4): 10 mL
  Administered 2021-08-11: 20 mL
  Administered 2021-08-12 – 2021-08-17 (×10): 10 mL
  Administered 2021-08-17: 30 mL
  Administered 2021-08-17 – 2021-09-04 (×31): 10 mL
  Administered 2021-09-04: 20 mL
  Administered 2021-09-05 – 2021-09-07 (×4): 10 mL

## 2021-07-19 MED ORDER — POTASSIUM CHLORIDE 10 MEQ/50ML IV SOLN
10.0000 meq | Freq: Once | INTRAVENOUS | Status: AC
  Administered 2021-07-19: 10 meq via INTRAVENOUS

## 2021-07-19 MED ORDER — HEPARIN (PORCINE) 25000 UT/250ML-% IV SOLN
1350.0000 [IU]/h | INTRAVENOUS | Status: DC
  Administered 2021-07-19: 950 [IU]/h via INTRAVENOUS
  Administered 2021-07-20: 1150 [IU]/h via INTRAVENOUS
  Filled 2021-07-19 (×2): qty 250

## 2021-07-19 MED ORDER — DEXTROSE IN LACTATED RINGERS 5 % IV SOLN: INTRAVENOUS | Status: AC

## 2021-07-19 MED ORDER — SODIUM CHLORIDE 0.9% FLUSH
10.0000 mL | INTRAVENOUS | Status: DC | PRN
  Administered 2021-07-20 – 2021-07-22 (×2): 10 mL

## 2021-07-19 MED ORDER — POTASSIUM CHLORIDE 10 MEQ/100ML IV SOLN
INTRAVENOUS | Status: AC
  Administered 2021-07-19: 10 meq via INTRAVENOUS
  Filled 2021-07-19: qty 300

## 2021-07-19 MED ORDER — TRAVASOL 10 % IV SOLN
INTRAVENOUS | Status: AC
  Filled 2021-07-19: qty 595.2

## 2021-07-19 NOTE — Progress Notes (Signed)
[facility] ADULT ICU REPLACEMENT PROTOCOL   The patient does apply for the [facility] Adult ICU Electrolyte Replacment Protocol based on the criteria listed below:   1.Exclusion criteria: TCTS patients, ECMO patients and Hypothermia Protocol, and   Dialysis patients 2. Is GFR >/= 30 ml/min? Yes.    Patient's GFR today is 41 3. Is SCr </= 2? Yes.   Patient's SCr is 1.36 mg/dL 4. Did SCr increase >/= 0.5 in 24 hours? No. 5.[name] weight >40kg  Yes.   6. Abnormal electrolyte(s): k 3.2, mag 1.3  7. Electrolytes replaced per protocol 8.  Call MD STAT for K+ </= 2.5, Phos </= 1, or Mag </= 1 Physician:    Ronda Fairly A 07/19/2021 6:03 AM

## 2021-07-19 NOTE — H&P (Signed)
[facility]   Patient name: Lauren [name] Admit date: 07/18/2021  DOB: 23-Jul-1946 LOS: 1  MRN: [id number] Consult date: 07/18/2021  Referring provider: Dr. Denton Brick CC: hypotension    History:  75 yo female was in hospital from 07/03/21 to 07/17/21 anemia from GI bleeding secondary to cecal mass.  She had Rt hemicolectomy on 07/06/21.  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  She was discharged to [facility].  She was sent to ER with hypotension.  She c/o nausea, abdominal pain, and right sided back pain with pain with right arm movement.  Found to be positive for COVID 19 which seems to be incidental.  Started on antibiotics, IV fluids, and pressors.  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 to suture through an umbilical hernia into abdominal wall with evidence for connection to overlying skin and this communicates with 7.4 x 5.7 x 6.9 cm fluid collection in the anterior abdominal wall, extensive gas tracking through soft tissues of the abdomen into Rt back and axilla  Micro:  COVID 8/24 >> positive Blood 8/24 >>   Lines:  Rt PICC 8/24 >>    Antibiotics:  Vancomycin 8/24 >> Zosyn 8/24 >> Clindamycin 8/24 >>    Consults:      Interim history:    Vital signs:  BP 117/84 (BP Location: Right Arm)   Pulse (!) 125   Temp 97.9 F (36.6 C) (Oral)   Resp 18   Ht '5\' 2"'$  (1.575 m)   Wt 116.2 kg   LMP 09/13/2016   SpO2 97%   BMI 46.85 kg/m   Intake/output:  I/O last 3 completed shifts: In: 3743.5 [I.V.:407.5; IV Piggyback:3336] Out: 300 [Urine:300]   Physical exam:   General - pale Eyes - pupils reactive ENT - poor dentition Cardiac - irregular Chest - decreased breath sounds, no wheeze Abdomen - tender on right side, midline wound with drain packing Extremities - 3+ lymphedema Skin - no rashes Neuro - normal strength, moves extremities, follows commands Psych - normal mood and behavior  Best practice:   DVT - xarelto as outpt SUP - protonix Nutrition - NPO Mobility - bed rest   Assessment/plan:   Septic shock with abdominal wall fluid collection and concern for necrotizing fasciitis. - change antibiotics to vancomycin, zosyn, clindamycin - hospitalist team will discuss with surgery about transferring patient to [facility] for further surgical assessment - pressors to keep MAP > 65 - continue IV fluids  Metastatic colon cancer s/p Rt hemicolectomy. - keep NPO for now  COVID 19 positive. - seems to be incidental finding - monitor oxygenation  Chronic combined CHF, hx of A fib. - monitor hemodynamics  Anemia of critical illness and chronic disease. - f/u CBC - transfuse for Hb < 7  Hyponatremia, hypokalemia. - f/u BMET - replace electrolytes as needed  D/w Dr.  Emokpae  Resolved hospital problems:    Goals of care/Family discussions:  Code status: full  Labs:   CMP Latest Ref Rng & Units 07/19/2021 07/18/2021 07/15/2021  Glucose 70 - 99 mg/dL 68(L) 83 82  BUN 8 - 23 mg/dL '17 17 14  '$ Creatinine 0.44 - 1.00 mg/dL 1.36(H) 1.58(H) 0.94  Sodium 135 - 145 mmol/L 131(L) 129(L) 133(L)  Potassium 3.5 - 5.1 mmol/L 3.2(L) 2.8(L) 3.4(L)  Chloride 98 - 111 mmol/L 99  94(L) 100  CO2 22 - 32 mmol/L '22 24 26  '$ Calcium 8.9 - 10.3 mg/dL 6.9(L) 7.2(L) 7.8(L)  Total Protein 6.5 - 8.1 g/dL 4.5(L) 5.4(L) -  Total Bilirubin 0.3 - 1.2 mg/dL 1.1 1.7(H) -  Alkaline Phos 38 - 126 U/L 76 102 -  AST 15 - 41 U/L 25 28 -  ALT 0 - 44 U/L 17 17 -    CBC Latest Ref Rng & Units 07/19/2021 07/18/2021 07/15/2021  WBC 4.0 - 10.5 K/uL 26.4(H) 33.1(H) 10.3  Hemoglobin 12.0 - 15.0 g/dL 8.5(L) 9.8(L) 8.6(L)  Hematocrit 36.0 - 46.0 % 28.6(L) 34.2(L) 30.3(L)  Platelets 150 - 400 K/uL 304 318 264    ABG No results found for: PHART, PCO2ART, PO2ART, HCO3, TCO2, ACIDBASEDEF, O2SAT  CBG (last 3)  Recent Labs    07/19/21 0043 07/19/21 0352 07/19/21 0835  GLUCAP 83 79 81     Past surgical history:  She  has a past surgical history that includes Cholecystectomy (N/A, 03/25/2014); Colonoscopy with propofol (N/A, 07/05/2021); Esophagogastroduodenoscopy (egd) with propofol (N/A, 07/05/2021); biopsy (07/05/2021); polypectomy (07/05/2021); and Partial colectomy (N/A, 07/06/2021).  Social history:  She  reports that she has never smoked. She has never used smokeless tobacco. She reports that she does not drink alcohol and does not use drugs.   Review of systems:  Reviewed and negative  Family history:  Her family history includes Cancer in her father; Diabetes in her maternal grandfather; Heart disease in her maternal grandmother; Hypertension in her brother; Kidney disease in her maternal grandfather; Kidney failure in her brother.    Medications:   No current facility-administered medications on file prior to encounter.   Current Outpatient Medications on File Prior to Encounter  Medication Sig   acetaminophen (TYLENOL) 500 MG tablet Take 1,000 mg by mouth every 6 (six) hours as needed.   ferrous sulfate 325 (65 FE) MG EC tablet Take 1 tablet (325 mg total) by mouth 2 (two) times daily.   levothyroxine (SYNTHROID) 100 MCG tablet Take 1 tablet (100 mcg total) by mouth daily.    loperamide (IMODIUM) 2 MG capsule Take 1 capsule (2 mg total) by mouth every 6 (six) hours as needed for diarrhea or loose stools.   metoprolol succinate (TOPROL-XL) 25 MG 24 hr tablet Take 1 tablet (25 mg total) by mouth 2 (two) times daily.   ondansetron (ZOFRAN-ODT) 4 MG disintegrating tablet Take 1 tablet (4 mg total) by mouth every 6 (six) hours as needed for nausea.   oxyCODONE (OXY IR/ROXICODONE) 5 MG immediate release tablet Take 1 tablet (5 mg total) by mouth every 6 (six) hours as needed for moderate pain.   pantoprazole (PROTONIX) 40 MG tablet Take 1 tablet (40 mg total) by mouth daily at 12 noon.   polyethylene glycol (MIRALAX / GLYCOLAX) 17 g packet Take 17 g by mouth daily as needed for mild constipation.   potassium chloride SA (KLOR-CON) 20 MEQ tablet TAKE ONE (1) TABLET EACH DAY (Patient not taking: No sig reported)   rivaroxaban (  XARELTO) 20 MG TABS tablet Take 1 tablet (20 mg total) by mouth daily with supper.   torsemide (DEMADEX) 20 MG tablet Take 1 tablet (20 mg total) by mouth daily.     Critical care time: 41 minutes  Chesley Mires, MD [place] Pager - [phone] 07/19/2021, 8:51 AM

## 2021-07-19 NOTE — Progress Notes (Signed)
Pharmacy Antibiotic Note  Lauren [name] is a 75 y.o. female admitted on 07/18/2021 with cellulitis.  Pharmacy has been consulted for Vancomycin and meropenem dosing. Patient was initially suspected to have necrotizing fasciitis, however that is unconfirmed at this time. In the setting of acute kidney injury evidenced by low Scr and urine output, [name] vancomycin dose requires adjustment. White blood count has decreased, but remains elevated. Patient remains afebrile. Will continue current aggressive meropenem dosing and adjust as renal function changes. Current norepinephrine requirements are stable at 5 mcg/min. Will follow clinical status, culture results, and duration of therapy.   Plan: Decrease to vancomycin 750 mg every 24 hours for estimated AUC of 446 using Scr of 1.36 and IBW to calculate CrCl and Ke. Meropenem 2 grams every 12 hours   Height: '5\' 2"'$  (157.5 cm) Weight: 116.2 kg (256 lb 2.8 oz) IBW/kg (Calculated) : 50.1  Temp (24hrs), Avg:98.3 F (36.8 C), Min:97.6 F (36.4 C), Max:98.8 F (37.1 C)  Recent Labs  Lab 07/13/21 0611 07/14/21 0500 07/14/21 0621 07/14/21 1401 07/15/21 0616 07/18/21 0830 07/18/21 0835 07/18/21 1022 07/19/21 0330  WBC 15.0*  --  9.8 10.4 10.3 33.1*  --   --  26.4*  CREATININE 0.94 1.10*  --   --  0.94 1.58*  --   --  1.36*  LATICACIDVEN  --   --   --   --   --   --  4.1* 2.3*  --      Estimated Creatinine Clearance: 45.2 mL/min (A) (by C-G formula based on SCr of 1.36 mg/dL (H)).    Allergies  Allergen Reactions   Codeine Itching   Compazine [Prochlorperazine Edisylate]    Prednisone Nausea And Vomiting   Tetanus Toxoids Other (See Comments)    Knot like "hen egg" came up    Vicodin [Hydrocodone-Acetaminophen] Itching    Antimicrobials this admission: Vancomycin 8/24 >>  Zosyn 8/24  Meropenem 8/25>>  Microbiology results: 8/24 BCx: NGTD 8/24 MRSA PCR: negative  Thank you for allowing pharmacy to participate in this  patient's care.  Reatha Harps, PharmD PGY1 Pharmacy Resident 07/19/2021 9:16 AM Check [email or web address] for unit specific pharmacy number

## 2021-07-19 NOTE — Progress Notes (Signed)
Peripherally Inserted Central Catheter Placement  The IV Nurse has discussed with the patient and/or persons authorized to consent for the patient, the purpose of this procedure and the potential benefits and risks involved with this procedure.  The benefits include less needle sticks, lab draws from the catheter, and the patient may be discharged home with the catheter. Risks include, but not limited to, infection, bleeding, blood clot (thrombus formation), and puncture of an artery; nerve damage and irregular heartbeat and possibility to perform a PICC exchange if needed/ordered by physician.  Alternatives to this procedure were also discussed.  Bard Power PICC patient education guide, fact sheet on infection prevention and patient information card has been provided to patient /or left at bedside.    PICC Placement Documentation  PICC Triple Lumen 07/18/21 PICC Right Brachial 38 cm 3 cm (Active)  Indication for Insertion or Continuance of Line Prolonged intravenous therapies;Administration of hyperosmolar/irritating solutions (i.e. TPN, Vancomycin, etc.) 07/19/21 0800  Exposed Catheter (cm) 4.5 cm 07/18/21 2316  Site Assessment Clean;Dry;Intact 07/19/21 0800  Lumen #1 Status Infusing 07/19/21 0800  Lumen #2 Status Infusing 07/19/21 0800  Lumen #3 Status Infusing 07/19/21 0800  Dressing Type Transparent;Securing device 07/19/21 0800  Dressing Status Clean;Dry;Intact 07/19/21 0800  Antimicrobial disc in place? Yes 07/19/21 0800  Safety Lock Not Applicable [id number]  Line Care Connections checked and tightened 07/19/21 0800  Line Adjustment (NICU/IV Team Only) Yes 07/18/21 2316  Dressing Intervention New dressing 07/18/21 2316  Dressing Change Due 07/25/21 07/19/21 0800     PICC Triple Lumen 07/19/21 PICC Left Brachial 41 cm 0 cm (Active)  Indication for Insertion or Continuance of Line Administration of hyperosmolar/irritating solutions (i.e. TPN, Vancomycin, etc.) 07/19/21 1659  Exposed  Catheter (cm) 0 cm 07/19/21 1659  Site Assessment Clean;Dry;Intact 07/19/21 1659  Lumen #1 Status Flushed;Saline locked;Blood return noted 07/19/21 1659  Lumen #2 Status Flushed;Saline locked;Blood return noted 07/19/21 1659  Lumen #3 Status Flushed;Saline locked;Blood return noted 07/19/21 1659  Dressing Type Transparent;Securing device 07/19/21 1659  Dressing Status Clean;Dry;Intact 07/19/21 1659  Antimicrobial disc in place? Yes 07/19/21 1659  Safety Lock Not Applicable [id number]  Dressing Intervention New dressing;Other (Comment) 07/19/21 [place], [place] 07/19/2021, 5:03 PM

## 2021-07-19 NOTE — Progress Notes (Signed)
ANTICOAGULATION CONSULT NOTE - Initial Consult  Pharmacy Consult for Heparin (while Xarelto on hold) Indication: atrial fibrillation  Allergies  Allergen Reactions   Codeine Itching   Compazine [Prochlorperazine Edisylate]    Prednisone Nausea And Vomiting   Tetanus Toxoids Other (See Comments)    Knot like "hen egg" came up    Vicodin [Hydrocodone-Acetaminophen] Itching    Patient Measurements: Height: '5\' 2"'$  (157.5 cm) Weight: 116.2 kg (256 lb 2.8 oz) IBW/kg (Calculated) : 50.1 Heparin Dosing Weight: 78 kg  Vital Signs: Temp: 97.9 F (36.6 C) (08/25 1135) Temp Source: Oral (08/25 1135) BP: 101/69 (08/25 1245) Pulse Rate: 100 (08/25 1245)  Labs: Recent Labs    07/18/21 0830 07/19/21 0330  HGB 9.8* 8.5*  HCT 34.2* 28.6*  PLT 318 304  APTT 33  --   LABPROT 30.6*  --   INR 2.9*  --   CREATININE 1.58* 1.36*    Estimated Creatinine Clearance: 45.2 mL/min (A) (by C-G formula based on SCr of 1.36 mg/dL (H)).   Medical History: Past Medical History:  Diagnosis Date   A-fib ([place])    Acute cholecystitis 12/06/2012   Atrial fibrillation with RVR ([place]) 12/06/2012   Diverticulosis    Hypothyroidism    Obesity    Pancreatitis, acute 0000000   Periumbilical hernia     Medications:  Infusions:   dextrose 5% lactated ringers 125 mL/hr at 07/19/21 1200   norepinephrine (LEVOPHED) Adult infusion 2 mcg/min (07/19/21 1200)   piperacillin-tazobactam (ZOSYN)  IV 3.375 g (07/19/21 1335)   [START ON 07/20/2021] remdesivir 100 mg in NS 100 mL     TPN ADULT (ION)      Assessment: 74 YOF on [place] PTA for hx Afib - restarted after recent R-hemicolectomy on 8/12. 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 Surgery has okayed restart of Heparin for anticoagulation.  Hgb 8.5, no bleeding noted. Last Apixaban dose presumably 8/23. Will start with no bolus and low goal.   Goal of Therapy:  Heparin level 0.3-0.5 units/ml aPTT 66-85 Monitor platelets by  anticoagulation protocol: Yes   Plan:  - Start Heparin 950 units/hr (9.5 ml/hr) - Will continue to monitor for any signs/symptoms of bleeding and will follow up with aPTT/heparin level in 8 hours   Thank you for allowing pharmacy to be a part of this patient's care.  Alycia Rossetti, PharmD, BCPS Clinical Pharmacist Clinical phone for 07/19/2021: [phone] 07/19/2021 3:01 PM   **Pharmacist phone directory can now be found on [email or web address] ([email or web address]).  Listed under [place].

## 2021-07-19 NOTE — Consult Note (Addendum)
WOC Nurse Consult Note: Patient receiving care in [facility] [phone] Patient currently has a medium size Eakin Pouch in place, not connected to suction or bedside drainage bag.  Reason for Consult: CC Fistula management Wound type: Mid Abdominal Colocutaneous fistula Pressure Injury POA: NA Wound bed: Stool is pooling in the wound bed. Eakin pouch connected to a drainage bag for now. Was not able to get a red rubber catheter at this time for wall suction Drainage (amount, consistency, odor) Dark Vera stool Periwound: Skin is protected very well with the Eakin pouch that is currently on the patient.  Dressing procedure/placement/frequency: Instructions if the Eakin pouch is connected to wall suction: Bedside nurse: please change Eakin pouch PRN if leaking or every 7-10 days as follows:  Remove previous pouch, cleanse area surrounding wound with skin prep wipe.  Cut medium Eakin pouch Kellie Simmering # [phone]) to size and apply, hold down edges and seal  Cut small slit near the top of the pouch and insert red rubber catheter (lawson # [id number]) (DO NOT THROW AWAY)  Attach red rubber catheter to 80 mm continuous wall suction   12:31 Red rubber catheter (24 Fr) arrived to the unit. Small slit made in the upper portion of the Eakin pouch. Catheter placed in the pouch, secured with pink tape, attached to the tubing and immediate suction obtained at 80 mmHg.   WOC will follow weekly and are available to this patient if needed, please page.   Cathlean Marseilles Tamala Julian, MSN, RN, [place], Lysle Pearl, [facility] Wound Treatment Associate Pager [phone]

## 2021-07-19 NOTE — Progress Notes (Signed)
Initial Nutrition Assessment  DOCUMENTATION CODES:   Not applicable  INTERVENTION:   - TPN dosing per Pharmacy  Monitor magnesium, potassium, and phosphorus twice daily for at least 3 days, MD to replete as needed, as pt is at risk for refeeding syndrome.  NUTRITION DIAGNOSIS:   Inadequate oral intake related to altered GI function, acute illness as evidenced by NPO status.  GOAL:   Patient will meet greater than or equal to 90% of their needs  MONITOR:   Labs, Weight trends, Skin, I & O's, Other (TPN)  REASON FOR ASSESSMENT:   Consult Assessment of nutrition requirement/status, New TPN/TNA  ASSESSMENT:   75 year old female who presented to the ED on 8/24 s/p CPR. Pt with recent admission from 8/09 to 8/23 with anemia from GI bleed secondary to cecal mass s/p R hemicolectomy with primary anastomosis on 07/06/21. 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 Pt also found to be positive for COVID-19 on admission. PMH of atrial fibrillation, CHF, HTN, lymphedema, mitral regurgitation. Pt admitted with septic shock with abdominal wall fluid collection and concern for necrotizing fasciitis.  CT abdomen/pelvis showing fistula extending from bowel adjacent to suture through an umbilical hernia into abdominal wall with evidence for connection to overlying skin (colocutaneous fistula). This communicates with a fluid collection in the anterior abdominal wall. There is also extensive gas tracking through soft tissues of the abdomen into the R back and axilla concerning for necrotizing fasciitis.  Pt currently with Eakins pouch to colocutaneous fistula which is draining stool. [place] nurse has added red rubber catheter to Eakin's pouch which is now attached to suction.  Pt is NPO. Plan is to start TPN today at 1800. TPN to start at 40 ml/hr which will provide 915 kcal and 60 grams of protein. Goal rate for TPN is 100 ml/hr which will provide 2300 kcal and 149 grams of  protein.  Admit weight: 114.9 kg Current weight: 116.2 kg  Reviewed weight history in chart. Last weight available to recent admissions is from April 2021 is 122.5 kg. Current weight is 116.2 kg. Pt now with +3 pitting edema to BLE. Unsure of dry weight at this time.  Medications reviewed and include: IV protonix, IV eraxis, IV magnesium sulfate, IV abx, levophed drip, IV KCl 10 mEq x 6 runs IVF: D5 in LR @ 125 ml/hr  Labs reviewed: sodium 131, potassium 3.2, creatinine 1.36, magnesium 1.3, hemoglobin 8.5 CBG's: 77-83  UOP: 300 ml x 12 hours I/O's: +4.3 L since admit  NUTRITION - FOCUSED PHYSICAL EXAM:  Unable to complete at this time.  Diet Order:   Diet Order             Diet NPO time specified  Diet effective now                   EDUCATION NEEDS:   Not appropriate for education at this time  Skin:  Skin Assessment: Skin Integrity Issues: Other: pressure injury to coccyx, pressure injury to R thigh, Eakins pouch to abdomen, MASD to bilateral buttocks  Last BM:  no documented BM (stool draining into Eakins pouch from colocutaneous fistula)  Height:   Ht Readings from Last 1 Encounters:  07/18/21 '5\' 2"'$  (1.575 m)    Weight:   Wt Readings from Last 1 Encounters:  07/18/21 116.2 kg    Ideal Body Weight:  50 kg  BMI:  Body mass index is 46.85 kg/m.  Estimated Nutritional Needs:   Kcal:  [id number]  Protein:  140-160 grams  Fluid:  >/= 2.0 L    Gustavus Bryant, MS, RD, LDN Inpatient Clinical Dietitian Please see AMiON for contact information.

## 2021-07-19 NOTE — Progress Notes (Addendum)
NAME:  Lauren [name], MRN:  [id number], DOB:  1946-09-23, LOS: 1 ADMISSION DATE:  07/18/2021, CONSULTATION DATE:  07/18/2021 REFERRING MD:  Dr. Denton Brick, CHIEF COMPLAINT:  Hypotension   History of Present Illness:  75 yo female was in hospital from 07/03/21 to 07/17/21 anemia from GI bleeding secondary to cecal mass.  She had Rt hemicolectomy on 07/06/21.  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  She was discharged to [facility].  She was sent to ER with hypotension.  She c/o nausea, abdominal pain, and right sided back pain with pain with right arm movement.  Found to be positive for COVID 19 which seems to be incidental.  Started on antibiotics, IV fluids, and pressors.  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  Pertinent  Medical History  Atrial fibrillation, Systolic/diastolic CHF, HTN, Lymphedema, Mitral regurgitation  Significant Hospital Events: Including procedures, antibiotic start and stop dates in addition to other pertinent events    Echo 06/29/21 >> EF 30%, mild/mod MR, severe LA dilation, PASP 34 mmHg 8/24 Admit, start pressors and antibiotics. Incidental COVID +, BC>>, Vanc, Eraxis, Mero. Txr to cone CT abd/pelvis 8/24 >> small b/l effusions, mild pancreatic atrophy, fistula extending from bowel adjacent to suture through an umbilical hernia into abdominal wall with evidence for connection to overlying skin and this communicates with 7.4 x 5.7 x 6.9 cm fluid collection in the anterior abdominal wall, extensive gas tracking through soft tissues of the abdomen into Rt back and axilla  Interim History / Subjective:  Transfer to  cone  Tmax 98.8  66mg levo  +3.4 L admission, 3064muop  Subjective denies SOB, endorses some tenderness on abdomen and chest.  Objective   Blood pressure 117/84, pulse (!) 125, temperature 97.9 F (36.6 C), temperature source Oral, resp. rate 18, height '5\' 2"'$  (1.575 m), weight 116.2 kg, last menstrual period 09/13/2016, SpO2 97 %, unknown if currently breastfeeding. On RA        Intake/Output Summary (Last 24 hours) at 07/19/2021 [id number] data filed at 07/19/2021 0800 Gross per 24 hour  Intake 3804.65 ml  Output 300 ml  Net 3504.65 ml   Filed Weights   07/18/21 0721 07/18/21 2048  Weight: 114.9 kg 116.2 kg    Examination: General:  in bed, obese, NAD HEENT: MM pink/moist, anicteric, atraumatic Neuro: GCS 15, RASS 0, PERRL3m45mV: S1S2, Afib, no m/r/g appreciated PULM:  clear in the upper lobes and in the lower lobes, Trachea midline, chest expansion symmetric GI: soft, some tenderness, incision with pouch and feculent dranage Extremities: warm/dry, no pretibial edema, capillary refill less than 3 seconds  Skin: no rashes or lesions   Resolved Hospital Problem list    Assessment & Plan:  Septic shock with abdominal wall fluid collection and concern for necrotizing fasciitis. 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 Fistula seen on  -Surgery consulted. Appreciate assistance. Monitor abdominal exam. Agree not toxic appearing currently. Some abdominal pain but does not appear out of proportion.  -Continue Vanc, eraxis, meropenenm -Goal MAP of 65. Titrate levophed to goal -Continue D5LR -Follow up BC/UC -Start TPN. Appreciate pharmacy assistance  -Continue eakins pouch to abdominal  wound.  Metastatic colon cancer s/p Rt hemicolectomy. 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 Incidental finding -monitor oxygenation. On RA  Chronic combined CHF Hx A fib. Currently on pressors,  Was on Xeralto -Hold home GDMT -Continue levophed as disucssed -Unclear surgical timing. Continue holding AC at this time. Will discuss with Dr. Vaughan Browner  AKI Creat 0.69 on 07/10/2021. Now 1.58>1.36. 336m document UOP -Ensure renal perfusion. Goal MAP 65 or greater. -Avoid neprotoxic drugs as possible. -Strict I&O's -Follow up AM creatinine  Anemia, microcytic Chromic disease, was receiving iron supplementation  -follow up H/H -transfuse for Hgb <7%, active bleeding  Hyponatremia, hypokalemia, Hypomagnesia NA 129>131. K 3.2, Mg 1.3 -Goal K above 4, goal MG above 2 -Replace -Follow up on AM labs  Best Practice (right click and "Reselect all SmartList Selections" daily)   Diet/type: NPO DVT prophylaxis: SCD GI prophylaxis: PPI Lines: PICC Foley:  N/A Code Status:  full code Last date of multidisciplinary goals of care discussion [pending]   Critical care time: 31 minutes    TRedmond School, MSN, APRN, AGACNP-BC [facility]  07/19/2021 , 9:11 AM  Please see [email or web address] for pager details  If no response, please call [phone] After hours, please call Elink at [phone]

## 2021-07-19 NOTE — Significant Event (Signed)
Updated Merriann Fogel [place]'s brother, [name] over the phone at [phone].  Redmond School., MSN, APRN, AGACNP-BC [facility]  07/19/2021 , 2:36 PM  Please see [email or web address] for pager details  If no response, please call [phone] After hours, please call Elink at [phone]

## 2021-07-19 NOTE — Progress Notes (Addendum)
Subjective: CC: Patient w/ generalized abdominal pain and occasional nausea. Eakins pouch in place draining stool.   Objective: Vital signs in last 24 hours: Temp:  [97.6 F (36.4 C)-98.8 F (37.1 C)] 97.9 F (36.6 C) (08/25 0837) Pulse Rate:  [32-133] 90 (08/25 1030) Resp:  [13-26] 20 (08/25 1030) BP: (73-151)/(40-130) 111/73 (08/25 1030) SpO2:  [79 %-100 %] 93 % (08/25 1030) Weight:  TG:7069833 kg] 116.2 kg (08/24 2048)    Intake/Output from previous day: 08/24 0701 - 08/25 0700 In: 3743.5 [I.V.:407.5; IV Piggyback:3336] Out: 300 [Urine:300] Intake/Output this shift: Total I/O In: 502.5 [I.V.:299.2; IV Piggyback:203.3] Out: -   PE: Gen:  Alert, NAD, pleasant Card:  Reg Pulm: Normal rate and effort Abd: Soft, mild distension, generalized tenderness, midline wound covered with eakin's pouch w/ draining stool into pouch. +BS.  Psych: A&Ox3  Skin: no rashes noted, warm and dry  Lab Results:  Recent Labs    07/18/21 0830 07/19/21 0330  WBC 33.1* 26.4*  HGB 9.8* 8.5*  HCT 34.2* 28.6*  PLT 318 304   BMET Recent Labs    07/18/21 0830 07/19/21 0330  NA 129* 131*  K 2.8* 3.2*  CL 94* 99  CO2 24 22  GLUCOSE 83 68*  BUN 17 17  CREATININE 1.58* 1.36*  CALCIUM 7.2* 6.9*   PT/INR Recent Labs    07/18/21 0830  LABPROT 30.6*  INR 2.9*   CMP     Component Value Date/Time   NA 131 (L) 07/19/2021 0330   K 3.2 (L) 07/19/2021 0330   CL 99 07/19/2021 0330   CO2 22 07/19/2021 0330   GLUCOSE 68 (L) 07/19/2021 0330   BUN 17 07/19/2021 0330   CREATININE 1.36 (H) 07/19/2021 0330   CALCIUM 6.9 (L) 07/19/2021 0330   PROT 4.5 (L) 07/19/2021 0330   ALBUMIN 1.5 (L) 07/19/2021 0330   AST 25 07/19/2021 0330   ALT 17 07/19/2021 0330   ALKPHOS 76 07/19/2021 0330   BILITOT 1.1 07/19/2021 0330   GFRNONAA 41 (L) 07/19/2021 0330   GFRAA >90 03/28/2014 0435   Lipase     Component Value Date/Time   LIPASE 25 03/27/2014 0600    Studies/Results: CT ABDOMEN  PELVIS W CONTRAST  Result Date: 07/18/2021 CLINICAL DATA:  Abscess suspected EXAM: CT ABDOMEN AND PELVIS WITH CONTRAST TECHNIQUE: Multidetector CT imaging of the abdomen and pelvis was performed using the standard protocol following bolus administration of intravenous contrast. CONTRAST:  69m OMNIPAQUE IOHEXOL 350 MG/ML SOLN COMPARISON:  CT abdomen/pelvis 12/04/2012 FINDINGS: Lower chest: There are small bilateral pleural effusions with adjacent atelectasis. The imaged heart is unremarkable. Hepatobiliary: The liver is unremarkable. The gallbladder is surgically absent. Mild prominence of the common bile duct and intrahepatic ducts is likely due to the history of cholecystectomy. Pancreas: The pancreas is mildly atrophic. There are no focal lesions or contour abnormalities. There is no peripancreatic inflammatory change. Spleen: Unremarkable. Adrenals/Urinary Tract: The adrenals are unremarkable. There are no focal renal lesions or stones. There is no hydronephrosis or hydroureter. The bladder is unremarkable. There is no excretion of contrast on the delayed images into the collecting system. Stomach/Bowel: The stomach is grossly unremarkable. The patient is status post right hemicolectomy with a sutures identified in the mid abdomen. 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 This communicates with a 7.4 cm by 5.7 cm  by 6.9 cm collection in the anterior abdominal wall. There is mild thickening of multiple loops of small bowel in the lower mid abdomen. There are scattered colonic diverticula without evidence of acute diverticulitis. Vascular/Lymphatic: There is scattered calcified atherosclerotic plaque throughout the nonaneurysmal abdominal aorta. The main portal and splenic veins are patent. There is no abdominal or pelvic lymphadenopathy. Reproductive: The uterus and adnexa are  unremarkable. Other: There is small volume ascites scattered throughout the abdomen. Haziness in the mesenteric fat likely reflects inflammatory change. There is no free intraperitoneal air. There is extensive gas tracking throughout the soft tissues of the abdomen into the right back and axilla. No other organized or drainable fluid collections are identified. Musculoskeletal: There is no acute osseous abnormality. IMPRESSION: 1. Status post right hemicolectomy with a suture site in the mid abdomen. 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 The fistula is in communication with a focal collection with layering debris measuring up to 7.4 cm in the abdominal wall consistent with abscess. 2. Extensive soft tissue emphysema tracking throughout the body wall may be coming from the bowel, though necrotizing fasciitis would have a similar appearance. This was discussed with Dr Roderic Palau as below. 3. Mild inflammatory change of loops of small bowel and scattered ascites throughout the abdomen, likely reactive. No free intraperitoneal air is identified. 4. Absence of contrast in the renal collecting systems on the delayed phase images; correlate with renal function. 5. Small bilateral pleural effusions. These results were called by telephone at the time of interpretation on 07/18/2021 at 10:34 am to provider [facility] ZAMMIT , who verbally acknowledged these results. Electronically Signed   By: Valetta Mole M.D.   On: 07/18/2021 10:38   DG Chest Port 1 View  Result Date: 07/19/2021 CLINICAL DATA:  COVID-19 Abdominal pain EXAM: PORTABLE ABDOMEN - 1 VIEW; PORTABLE CHEST - 1 VIEW COMPARISON:  Chest radiograph 07/18/2021 FINDINGS: Chest: Right upper extremity PICC terminates in the region of the upper superior vena cava. Heart is mildly enlarged. No significant pulmonary vascular congestion. Mild  opacities at the left lung base favored to be atelectasis/scarring. Lungs are otherwise clear. Abdomen: Subcutaneous emphysema again seen throughout the abdominal and pelvic walls, similar to CT of the abdomen and pelvis from 07/18/2021. No dilated loops of bowel to indicate ileus or obstruction. There is levoconvex curvature of the lumbar spine. IMPRESSION: 1. Mild cardiomegaly. 2. Left basilar opacity favored to be atelectasis. 3. Redemonstration of diffuse abdominal wall emphysema. 4. Nonobstructive bowel gas pattern. Electronically Signed   By: Miachel Roux M.D.   On: 07/19/2021 07:46   DG CHEST PORT 1 VIEW  Result Date: 07/18/2021 CLINICAL DATA:  PICC line placement EXAM: PORTABLE CHEST 1 VIEW COMPARISON:  07/18/2021 FINDINGS: Right upper extremity PICC line is been placed with its tip overlying the expected superior vena cava. Lung volumes are small. Small left pleural effusion is stable. No pneumothorax. Cardiac size is mildly enlarged, unchanged. Pulmonary vascularity is normal. IMPRESSION: Right upper extremity PICC line tip within the superior vena cava. Small left pleural effusion, unchanged. Electronically Signed   By: Fidela Salisbury M.D.   On: 07/18/2021 22:34   DG Chest Portable 1 View  Result Date: 07/18/2021 CLINICAL DATA:  PICC line placement. EXAM: PORTABLE CHEST 1 VIEW COMPARISON:  July 18, 2021. FINDINGS: Stable cardiomediastinal silhouette. Interval placement of right-sided PICC line with distal tip in expected  position of the SVC. Mild bibasilar subsegmental atelectasis is noted with small pleural effusions. Bony thorax is unremarkable. IMPRESSION: Interval placement of right-sided PICC line with distal tip in expected position of the SVC. Mild bibasilar subsegmental atelectasis with small pleural effusions. Electronically Signed   By: Marijo Conception M.D.   On: 07/18/2021 15:01   DG Chest Port 1 View  Result Date: 07/18/2021 CLINICAL DATA:  Question sepsis EXAM: PORTABLE CHEST 1  VIEW COMPARISON:  Multiple chest radiographs, most recently 07/12/2021. KUB, 07/12/2021. FINDINGS: Support lines: Interval removal of RIGHT upper extremity PICC. Overlying support needs. Cardiac silhouette enlargement, unchanged. Low lung volumes. The RIGHT lung is clear. LEFT basilar consolidation obscuring the cardiac apex, unchanged. No pleural effusion or pneumothorax. Appearance of subcutaneous gas tracking along the RIGHT chest wall, axilla and imaged RIGHT upper quadrant. No interval osseous abnormality. IMPRESSION: 1. Interval removal RIGHT upper extremity PICC 2. LEFT basilar atelectasis, unchanged. 3. Overlying density with appearance of tracking subcutaneous gas along the RIGHT axilla and upper quadrant. Clinical correlation is recommended. Electronically Signed   By: Michaelle Birks M.D.   On: 07/18/2021 08:00   DG Abd Portable 1V  Result Date: 07/19/2021 CLINICAL DATA:  COVID-19 Abdominal pain EXAM: PORTABLE ABDOMEN - 1 VIEW; PORTABLE CHEST - 1 VIEW COMPARISON:  Chest radiograph 07/18/2021 FINDINGS: Chest: Right upper extremity PICC terminates in the region of the upper superior vena cava. Heart is mildly enlarged. No significant pulmonary vascular congestion. Mild opacities at the left lung base favored to be atelectasis/scarring. Lungs are otherwise clear. Abdomen: Subcutaneous emphysema again seen throughout the abdominal and pelvic walls, similar to CT of the abdomen and pelvis from 07/18/2021. No dilated loops of bowel to indicate ileus or obstruction. There is levoconvex curvature of the lumbar spine. IMPRESSION: 1. Mild cardiomegaly. 2. Left basilar opacity favored to be atelectasis. 3. Redemonstration of diffuse abdominal wall emphysema. 4. Nonobstructive bowel gas pattern. Electronically Signed   By: Miachel Roux M.D.   On: 07/19/2021 07:46   [place] EKG SITE RITE  Result Date: 07/18/2021 If [facility] image not attached, placement could not be confirmed due to current cardiac rhythm.  [place] EKG  SITE RITE  Result Date: 07/18/2021 If [facility] image not attached, placement could not be confirmed due to current cardiac rhythm.   Anti-infectives: Anti-infectives (From admission, onward)    Start     Dose/Rate Route Frequency Ordered Stop   07/20/21 1000  remdesivir 100 mg in sodium chloride 0.9 % 100 mL IVPB       See Hyperspace for full Linked Orders Report.   100 mg 200 mL/hr over 30 Minutes Intravenous Daily 07/19/21 1040 07/22/21 0959   07/20/21 0200  vancomycin (VANCOREADY) IVPB 750 mg/150 mL        750 mg 150 mL/hr over 60 Minutes Intravenous Every 24 hours 07/19/21 1036     07/19/21 2300  anidulafungin (ERAXIS) 100 mg in sodium chloride 0.9 % 100 mL IVPB        100 mg 78 mL/hr over 100 Minutes Intravenous Every 24 hours 07/18/21 2107 07/25/21 2259   07/19/21 1600  vancomycin (VANCOREADY) IVPB 750 mg/150 mL  Status:  Discontinued        750 mg 150 mL/hr over 60 Minutes Intravenous Every 24 hours 07/19/21 0919 07/19/21 1016   07/19/21 1600  vancomycin (VANCOREADY) IVPB 750 mg/150 mL  Status:  Discontinued        750 mg 150 mL/hr over 60 Minutes Intravenous Every 24  hours 07/19/21 1016 07/19/21 1036   07/19/21 1400  piperacillin-tazobactam (ZOSYN) IVPB 3.375 g        3.375 g 12.5 mL/hr over 240 Minutes Intravenous Every 8 hours 07/19/21 1016     07/19/21 1130  remdesivir 200 mg in sodium chloride 0.9% 250 mL IVPB       See Hyperspace for full Linked Orders Report.   200 mg 580 mL/hr over 30 Minutes Intravenous Once 07/19/21 1040     07/19/21 0100  metroNIDAZOLE (FLAGYL) IVPB 500 mg  Status:  Discontinued        500 mg 100 mL/hr over 60 Minutes Intravenous Every 12 hours 07/18/21 1203 07/18/21 1534   07/18/21 2200  ceFEPIme (MAXIPIME) 2 g in sodium chloride 0.9 % 100 mL IVPB  Status:  Discontinued        2 g 200 mL/hr over 30 Minutes Intravenous Every 12 hours 07/18/21 1156 07/18/21 1203   07/18/21 2200  meropenem (MERREM) 2 g in sodium chloride 0.9 % 100 mL IVPB   Status:  Discontinued        2 g 200 mL/hr over 30 Minutes Intravenous Every 12 hours 07/18/21 2104 07/19/21 1011   07/18/21 2200  anidulafungin (ERAXIS) 200 mg in sodium chloride 0.9 % 200 mL IVPB        200 mg 78 mL/hr over 200 Minutes Intravenous  Once 07/18/21 2108 07/19/21 0419   07/18/21 1800  clindamycin (CLEOCIN) IVPB 900 mg  Status:  Discontinued        900 mg 100 mL/hr over 30 Minutes Intravenous Every 8 hours 07/18/21 1534 07/18/21 2103   07/18/21 1600  vancomycin (VANCOREADY) IVPB 2000 mg/400 mL  Status:  Discontinued        2,000 mg 200 mL/hr over 120 Minutes Intravenous Every 24 hours 07/18/21 1544 07/19/21 0919   07/18/21 1545  piperacillin-tazobactam (ZOSYN) IVPB 3.375 g  Status:  Discontinued        3.375 g 12.5 mL/hr over 240 Minutes Intravenous Every 8 hours 07/18/21 1544 07/18/21 2104   07/18/21 1300  cefTRIAXone (ROCEPHIN) 2 g in sodium chloride 0.9 % 100 mL IVPB  Status:  Discontinued        2 g 200 mL/hr over 30 Minutes Intravenous Every 24 hours 07/18/21 1203 07/18/21 1628   07/18/21 1200  ceFEPIme (MAXIPIME) 2 g in sodium chloride 0.9 % 100 mL IVPB  Status:  Discontinued        2 g 200 mL/hr over 30 Minutes Intravenous Every 12 hours 07/18/21 1155 07/18/21 1156   07/18/21 0930  ceFEPIme (MAXIPIME) 2 g in sodium chloride 0.9 % 100 mL IVPB        2 g 200 mL/hr over 30 Minutes Intravenous  Once 07/18/21 0918 07/18/21 1135   07/18/21 0930  metroNIDAZOLE (FLAGYL) IVPB 500 mg  Status:  Discontinued        500 mg 100 mL/hr over 60 Minutes Intravenous  Once 07/18/21 [id number] 07/18/21 1534        Assessment/Plan 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 Arnoldo Morale at [place] on 8/12 - path w/ adenocarcinoma  - Midline wound opened by Dr. Arnoldo Morale at bedside 8/24 and now has stool decompressing through CC fistula  - Currently has eakin's pouch to aid in collection of output.  - We have  asked WOCN to see to aid in wound management. I have asked the RN to reach out to  me when they arrive so I can take a look at the wound - Keep strict NPO, start TPN and monitor strict I/O - Pharmacy is narrowing abx  - I discussed with CCM in person. Appreciate their assistance.  - Will ask my attending to review CT scan given the extensive soft tissue emphysema tracking throughout the body wall that can be signs of more serious disease process although this may be 2/2 CC fistula  FEN - Strict NPO. TPN. IVF per Pharm/CCM VTE - SCDs, okay for chemical prophylaxis. Will discuss with MD about starting anticoagulation ID - Currently on Eraxis, Zosyn and Vanc.  COVID + Sepsis on pressors and abx - appreciate ccm's assistance  A. Fib on Xarelto at home - will discuss with MD about resuming  ABL anemia - FOBT positive 8/24. Monitor. Hgb 9.8 > 8.5 Hypothyroidism   LOS: 1 day    Jillyn Ledger , [facility] 07/19/2021, 11:12 AM Please see Amion for pager number during day hours 7:00am-4:30pm

## 2021-07-19 NOTE — Progress Notes (Signed)
PHARMACY - TOTAL PARENTERAL NUTRITION CONSULT NOTE   Indication: Fistula  Patient Measurements: Height: '5\' 2"'$  (157.5 cm) Weight: 116.2 kg (256 lb 2.8 oz) IBW/kg (Calculated) : 50.1 TPN AdjBW (KG): 66.6 Body mass index is 46.85 kg/m. Usual Weight:   Assessment: 68 YOF with recent R-hemicolectomy on 8/12 in the setting of a cecal mass - consistent with R-colon metastatic adenocarcinoma. 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 Pharmacy consulted to start TPN for nutritional support in the setting of a colocutaneous fistula.    Glucose / Insulin: CBGs<150 Electrolytes: K 3.2 (K-run x 6 ordered), Mg 1.3 (Mg 6g IV ordered), Na 131 Renal: AKI (BL 1), SCr 1.36 << 1.58, BUN 17 Hepatic: LFTs/Tibili wnl, TG 73 Intake / Output; MIVF: D5LR @ 125 cc/hr GI Imaging: 8/24 Abd CT: colocutaneous fistula, 7.4 cm abdominal wall abscess, soft tissue edema, cellulitis vs nec fasciitis GI Surgeries / Procedures:  8/12 (last admit): R-hemicolectomy  .Central access: PICC placed 8/24 TPN start date: 8/25 >>  Nutritional Goals: Goal TPN rate is 100 mL/hr (provides 149 g of protein and 2300 kcals per day)  RD Assessment on 8/25: 2300-2500 kcal, 140-160g protein per day  150g, 600 kcal protein 600 kcal, 67g lipid 1100 kcal CHO, 323g  Current Nutrition:  NPO  Plan:  Start TPN at 40 mL/hr at 1800 - will provide 60g protein and 915 kcal Electrolytes in TPN: incr Na to 60 mEq/L, K 52mq/L, Ca 518m/L, Mg 34m334mL, and Phos 134m734mL. Cl:Ac 1:1 Add standard MVI and trace elements to TPN Initiate Sensitive q6h SSI and adjust as needed  Reduce MIVF to 85 mL/hr at 1800 Monitor TPN labs on Mon/Thurs, BMET/Mg/Phos in the AM  Thank you for allowing pharmacy to be a part of this patient's care.  ElizAlycia RossettiarmD, BCPS Clinical Pharmacist 07/19/2021 9:06 AM   **Pharmacist phone directory can now be found on [email or web address] ([email or web address]).  Listed  under [facility]

## 2021-07-20 DIAGNOSIS — A419 Sepsis, unspecified organism: Secondary | ICD-10-CM | POA: Diagnosis not present

## 2021-07-20 DIAGNOSIS — R6521 Severe sepsis with septic shock: Secondary | ICD-10-CM | POA: Diagnosis not present

## 2021-07-20 LAB — COMPREHENSIVE METABOLIC PANEL
ALT: 15 U/L (ref 0–44)
AST: 17 U/L (ref 15–41)
Albumin: 1.4 g/dL — ABNORMAL LOW (ref 3.5–5.0)
Alkaline Phosphatase: 100 U/L (ref 38–126)
Anion gap: 7 (ref 5–15)
BUN: 15 mg/dL (ref 8–23)
CO2: 23 mmol/L (ref 22–32)
Calcium: 7.4 mg/dL — ABNORMAL LOW (ref 8.9–10.3)
Chloride: 102 mmol/L (ref 98–111)
Creatinine, Ser: 1.07 mg/dL — ABNORMAL HIGH (ref 0.44–1.00)
GFR, Estimated: 55 mL/min — ABNORMAL LOW (ref >60)
Glucose, Bld: 112 mg/dL — ABNORMAL HIGH (ref 70–99)
Potassium: 3.1 mmol/L — ABNORMAL LOW (ref 3.5–5.1)
Sodium: 132 mmol/L — ABNORMAL LOW (ref 135–145)
Total Bilirubin: 0.7 mg/dL (ref 0.3–1.2)
Total Protein: 4.5 g/dL — ABNORMAL LOW (ref 6.5–8.1)

## 2021-07-20 LAB — CBC
HCT: 27.1 % — ABNORMAL LOW (ref 36.0–46.0)
Hemoglobin: 7.8 g/dL — ABNORMAL LOW (ref 12.0–15.0)
MCH: 21.7 pg — ABNORMAL LOW (ref 26.0–34.0)
MCHC: 28.8 g/dL — ABNORMAL LOW (ref 30.0–36.0)
MCV: 75.5 fL — ABNORMAL LOW (ref 80.0–100.0)
Platelets: 270 10*3/uL (ref 150–400)
RBC: 3.59 MIL/uL — ABNORMAL LOW (ref 3.87–5.11)
RDW: 28.4 % — ABNORMAL HIGH (ref 11.5–15.5)
WBC: 12.8 10*3/uL — ABNORMAL HIGH (ref 4.0–10.5)
nRBC: 0 % (ref 0.0–0.2)

## 2021-07-20 LAB — GLUCOSE, CAPILLARY
Glucose-Capillary: 101 mg/dL — ABNORMAL HIGH (ref 70–99)
Glucose-Capillary: 101 mg/dL — ABNORMAL HIGH (ref 70–99)
Glucose-Capillary: 103 mg/dL — ABNORMAL HIGH (ref 70–99)
Glucose-Capillary: 108 mg/dL — ABNORMAL HIGH (ref 70–99)
Glucose-Capillary: 108 mg/dL — ABNORMAL HIGH (ref 70–99)
Glucose-Capillary: 124 mg/dL — ABNORMAL HIGH (ref 70–99)

## 2021-07-20 LAB — HEPARIN LEVEL (UNFRACTIONATED)
Heparin Unfractionated: 0.11 IU/mL — ABNORMAL LOW (ref 0.30–0.70)
Heparin Unfractionated: <0.1 IU/mL — ABNORMAL LOW (ref 0.30–0.70)
Heparin Unfractionated: <0.1 IU/mL — ABNORMAL LOW (ref 0.30–0.70)

## 2021-07-20 LAB — MAGNESIUM: Magnesium: 2.2 mg/dL (ref 1.7–2.4)

## 2021-07-20 LAB — PHOSPHORUS: Phosphorus: 3.1 mg/dL (ref 2.5–4.6)

## 2021-07-20 LAB — PREALBUMIN: Prealbumin: <5 mg/dL — ABNORMAL LOW (ref 18–38)

## 2021-07-20 LAB — PROCALCITONIN: Procalcitonin: 1.93 ng/mL

## 2021-07-20 LAB — APTT: aPTT: 59 seconds — ABNORMAL HIGH (ref 24–36)

## 2021-07-20 MED ORDER — LEVOTHYROXINE SODIUM 100 MCG/5ML IV SOLN
50.0000 ug | Freq: Every day | INTRAVENOUS | Status: DC
  Administered 2021-07-21 – 2021-08-09 (×19): 50 ug via INTRAVENOUS
  Filled 2021-07-20 (×22): qty 5

## 2021-07-20 MED ORDER — LEVOTHYROXINE SODIUM 100 MCG/5ML IV SOLN: 50.0000 ug | Freq: Every day | INTRAVENOUS | Status: DC

## 2021-07-20 MED ORDER — HYDROMORPHONE HCL 1 MG/ML IJ SOLN
0.2500 mg | INTRAMUSCULAR | Status: DC | PRN
  Administered 2021-07-20 – 2021-07-26 (×19): 0.25 mg via INTRAVENOUS
  Filled 2021-07-20: qty 1
  Filled 2021-07-20: qty 0.5
  Filled 2021-07-20 (×18): qty 1

## 2021-07-20 MED ORDER — POTASSIUM CHLORIDE 10 MEQ/50ML IV SOLN
10.0000 meq | INTRAVENOUS | Status: AC
Start: 2021-07-20 — End: 2021-07-20
  Administered 2021-07-20 (×3): 10 meq via INTRAVENOUS
  Filled 2021-07-20 (×2): qty 50

## 2021-07-20 MED ORDER — ENOXAPARIN SODIUM 120 MG/0.8ML IJ SOSY
120.0000 mg | PREFILLED_SYRINGE | Freq: Two times a day (BID) | INTRAMUSCULAR | Status: DC
  Filled 2021-07-20: qty 0.8

## 2021-07-20 MED ORDER — ONDANSETRON HCL 4 MG/2ML IJ SOLN
4.0000 mg | Freq: Four times a day (QID) | INTRAMUSCULAR | Status: DC | PRN
  Administered 2021-07-20 – 2021-08-31 (×44): 4 mg via INTRAVENOUS
  Filled 2021-07-20 (×48): qty 2

## 2021-07-20 MED ORDER — HYDROMORPHONE HCL 1 MG/ML IJ SOLN
0.5000 mg | INTRAMUSCULAR | Status: DC | PRN
  Administered 2021-07-20 (×2): 0.5 mg via INTRAVENOUS
  Filled 2021-07-20 (×2): qty 0.5

## 2021-07-20 MED ORDER — HEPARIN (PORCINE) 25000 UT/250ML-% IV SOLN
1850.0000 [IU]/h | INTRAVENOUS | Status: DC
  Administered 2021-07-20: 1150 [IU]/h via INTRAVENOUS
  Administered 2021-07-23 (×2): 2000 [IU]/h via INTRAVENOUS
  Filled 2021-07-20 (×6): qty 250

## 2021-07-20 MED ORDER — TRAVASOL 10 % IV SOLN
INTRAVENOUS | Status: AC
  Filled 2021-07-20: qty 1080

## 2021-07-20 MED ORDER — POTASSIUM CHLORIDE 10 MEQ/50ML IV SOLN
10.0000 meq | INTRAVENOUS | Status: AC
Start: 2021-07-20 — End: 2021-07-20
  Administered 2021-07-20 (×3): 10 meq via INTRAVENOUS
  Filled 2021-07-20 (×3): qty 50

## 2021-07-20 MED ORDER — ENOXAPARIN SODIUM 100 MG/ML IJ SOSY
100.0000 mg | PREFILLED_SYRINGE | Freq: Two times a day (BID) | INTRAMUSCULAR | Status: DC
  Filled 2021-07-20: qty 1

## 2021-07-20 MED ORDER — METOPROLOL TARTRATE 5 MG/5ML IV SOLN
2.5000 mg | Freq: Four times a day (QID) | INTRAVENOUS | Status: DC
  Administered 2021-07-20 – 2021-07-25 (×19): 2.5 mg via INTRAVENOUS
  Filled 2021-07-20 (×21): qty 5

## 2021-07-20 NOTE — Significant Event (Signed)
Attempted to call [name] x2 to update him on the condition of his sister Lauren [name] condition. Unable to reach via phone.  Redmond School., MSN, APRN, AGACNP-BC [facility]  07/20/2021 , 12:38 PM  Please see [email or web address] for pager details  If no response, please call [phone] After hours, please call Elink at [phone]

## 2021-07-20 NOTE — Progress Notes (Addendum)
ANTICOAGULATION CONSULT NOTE Pharmacy Consult for Heparin  Indication: atrial fibrillation  Allergies  Allergen Reactions   Codeine Itching   Compazine [Prochlorperazine Edisylate]    Prednisone Nausea And Vomiting   Tetanus Toxoids Other (See Comments)    Knot like "hen egg" came up    Vicodin [Hydrocodone-Acetaminophen] Itching    Patient Measurements: Height: '5\' 2"'$  (157.5 cm) Weight: 121 kg (266 lb 12.1 oz) IBW/kg (Calculated) : 50.1 Heparin Dosing Weight: 78 kg  Vital Signs: Temp: 97.7 F (36.5 C) (08/26 1108) Temp Source: Oral (08/26 1108) BP: 79/59 (08/26 1300) Pulse Rate: 107 (08/26 1300)  Labs: Recent Labs    07/18/21 0830 07/19/21 0330 07/20/21 0250 07/20/21 1208  HGB 9.8* 8.5* 7.8*  --   HCT 34.2* 28.6* 27.1*  --   PLT 318 304 270  --   APTT 33  --  59*  --   LABPROT 30.6*  --   --   --   INR 2.9*  --   --   --   HEPARINUNFRC  --   --  0.11* <0.10*  CREATININE 1.58* 1.36* 1.07*  --      Estimated Creatinine Clearance: 58.9 mL/min (A) (by C-G formula based on SCr of 1.07 mg/dL (H)).   Assessment: 75 y.o. female s/p hemicolectomy 8/12, with history of Afib on Eliqus prior to arrival. Eliquis is on hold after surgery because patient is NPO to allow bowel rest. Most recent heparin level was subtherapeutic, however nursing staff reported infusion issues. The heparin drip was being infused through a peripheral line that experienced reduced heparin flow with frequent bending of the patients arm per nursing. The nurse swapped from a peripheral line to PICC around 1230 on 8/26, however there was inadequate heparin administration for several hours. The patient does not wish to pursue Lovenox at this time and has refused multiple subcutaneous injections. In the setting of inadequate heparin administration, no adjustments will be made to heparin rate at this time. Heparin level will be reevaluated 8 hours from the time the infusion was moved to the PICC.  Goal of  Therapy:  Heparin level 0.3-0.5 units/mL Monitor platelets by anticoagulation protocol: Yes   Plan: Continue heparin at 1150 units/hour Check 8 hour heparin level Monitor daily CBC and heparin level

## 2021-07-20 NOTE — Progress Notes (Signed)
NAME:  Lauren [name], MRN:  [id number], DOB:  02-Jun-1946, LOS: 2 ADMISSION DATE:  07/18/2021, CONSULTATION DATE:  07/18/2021 REFERRING MD:  Dr. Denton Brick, CHIEF COMPLAINT:  Hypotension   History of Present Illness:  75 yo female was in hospital from 07/03/21 to 07/17/21 anemia from GI bleeding secondary to cecal mass.  She had Rt hemicolectomy on 07/06/21.  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  She was discharged to [facility].  She was sent to ER with hypotension.  She c/o nausea, abdominal pain, and right sided back pain with pain with right arm movement.  Found to be positive for COVID 19 which seems to be incidental.  Started on antibiotics, IV fluids, and pressors.  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  Pertinent  Medical History  Atrial fibrillation, Systolic/diastolic CHF, HTN, Lymphedema, Mitral regurgitation  Significant Hospital Events: Including procedures, antibiotic start and stop dates in addition to other pertinent events    Echo 06/29/21 >> EF 30%, mild/mod MR, severe LA dilation, PASP 34 mmHg 8/24 Admit, start pressors and antibiotics. Incidental COVID +, BC>>, Vanc, Eraxis, Mero. Txr to cone CT abd/pelvis 8/24 >> small b/l effusions, mild pancreatic atrophy, fistula extending from bowel adjacent to suture through an umbilical hernia into abdominal wall with evidence for connection to overlying skin and this communicates with 7.4 x 5.7 x 6.9 cm fluid collection in the anterior abdominal wall, extensive gas tracking through soft tissues of the abdomen into Rt back and axilla Midline wound opened by Dr. Arnoldo Morale at bedside on  8/24 with stool decompressing. 8/25 TPN Started VANC/ZOSYN/ERAXIS> zosyn  Interim History / Subjective:  Tmax 99.5  1.1 L UOP, + 4.1L admission  Off levophed  No acute events overnight  Subjective: Denies chest pain and SOB. Complaints of pain around abdominal wound and muscle soreness.   Objective   Blood pressure 99/73, pulse (!) 134, temperature (!) 97.4 F (36.3 C), temperature source Oral, resp. rate (!) 25, height '5\' 2"'$  (1.575 m), weight 121 kg, last menstrual period 09/13/2016, SpO2 94 %, unknown if currently breastfeeding. On RA        Intake/Output Summary (Last 24 hours) at 07/20/2021 1036 Last data filed at 07/20/2021 0800 Gross per 24 hour  Intake 2403.65 ml  Output 1300 ml  Net 1103.65 ml   Filed Weights   07/18/21 0721 07/18/21 2048 07/19/21 1333  Weight: 114.9 kg 116.2 kg 121 kg    Examination: General: in bed, obese, complaints of pain at wound HEENT: MM pink/moist,anicteric, atraumatic Neuro: GCS 15, RASS 0, PERRL 78m CV: S1S2, Afib, no m/r/g appreciated PULM:  clear in the upper lobes and in the lower lobes, Trachea midline, chest expansion symmetric GI: soft, generalized tenderness, midline wound with eakins pouch with feculent output, hypoactive bs Extremities: warm/dry, no pretibial edema, capillary refill less than 3 seconds  Skin: no rashes or lesions   Resolved Hospital Problem list    Assessment & Plan:  Septic shock with abdominal wall fluid collection-Improving  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 Fistula seen on CT. Midline wound opened by Dr. JArnoldo Moraleat bedside on 8/24 with stool decompressing. Off pressors. Adequately  fluid resuscitated. Tmax 99.5 WBC 26>12.8. Does not appear toxic. - Appreciate surgery assistance. Doubts that it is necrotizing infection.  -Goal MAP 65.  -Continue to follow cultures. NGTD BC -Continue TPN, NPO, Bowel rest -Eakins pouch to abd wound. Managed per  surgery -Continue Zosyn for at least 7 days total (2/7) -Hydromorphone  0.25 to 0.'5mg'$  for pain   Metastatic colon cancer s/p Rt hemicolectomy. 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 Incidental finding. Remains on RA -monitor oxygenation. -Day 2 remdesivir   Chronic combined CHF Hx A fib.- rate not controlled Was on Xeralto. On Metoprolol 25XL BID. Off NE overnight -Starting 2.'5mg'$  lopressor q6h -No surgical plans at this time per GS. Transition from heparin GTT to LMWH.  AKI Creat 0.69 on 07/10/2021. Now 1.58>1.36>1.07. 1.1 L uop -Ensure renal perfusion. Goal MAP 65 or greater. -Avoid neprotoxic drugs as possible. -Strict I&O's -Follow up AM creatinine  Anemia, microcytic Chromic disease, was receiving iron supplementation. HGB 8.5>7.8. No signs of bleeding. -follow up H/H -transfuse for Hgb <7%, active bleeding -Monitor for bleeding  Hyponatremia, hypokalemia,  Hypomagnesia-Resolved NA [id number]. K 3.1, Mg 2.2 -On TPN -Goal K above 4, goal MG above 2 -Follow up on AM labs -NPO  Stable for transfer to progressive care  Best Practice (right click and "Reselect all SmartList Selections" daily)   Diet/type: NPO DVT prophylaxis: LMWH GI prophylaxis: PPI Lines: PICC Foley:  N/A Code Status:  full code Last date of multidisciplinary goals of care discussion [pending]   Critical care time: n/a minutes    [employer]., MSN, APRN, AGACNP-BC [facility]  07/20/2021 , 10:36 AM  Please see [email or web address] for pager details  If no response, please call [phone] After hours, please call Elink at [phone]

## 2021-07-20 NOTE — Progress Notes (Signed)
[place] Progress Note Patient Name: KITZIE KEEBLER DOB: 03-06-1946 MRN: [id number]   Date of Service  07/20/2021  HPI/Events of Note  K+ 3.1, Creatinine 1.07, patient is on TPN.  eICU Interventions  KCL 10 meq iv via PICC Q 1 hour x 3 ordered.        Kerry Kass Taelyn Nemes 07/20/2021, 4:19 AM

## 2021-07-20 NOTE — Progress Notes (Signed)
PHARMACY - TOTAL PARENTERAL NUTRITION CONSULT NOTE   Indication: Fistula  Patient Measurements: Height: '5\' 2"'$  (157.5 cm) Weight: 121 kg (266 lb 12.1 oz) IBW/kg (Calculated) : 50.1 TPN AdjBW (KG): 66.6 Body mass index is 48.79 kg/m. Usual Weight:   Assessment: 89 YOF with recent R-hemicolectomy on 8/12 in the setting of a cecal mass - consistent with R-colon metastatic adenocarcinoma. 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 Pharmacy consulted to start TPN for nutritional support in the setting of a colocutaneous fistula.    Glucose / Insulin: CBGs<150, no SSI utilized Electrolytes: K 3.1 (K-run x 3 ordered by CCM, will supplement an additional 3 K-runs for total of 6 today), Mg 2.2, Na 132 Renal: AKI (BL 1), SCr 1.07 << 1.36, BUN 15 Hepatic: LFTs/Tibili wnl, TG 73 Intake / Output; MIVF: off IVF, UOP 1.3L GI Imaging: 8/24 Abd CT: colocutaneous fistula, 7.4 cm abdominal wall abscess, soft tissue edema, cellulitis vs nec fasciitis GI Surgeries / Procedures:  8/12 (last admit): R-hemicolectomy  .Central access: PICC replaced 8/25 TPN start date: 8/25 >>  Nutritional Goals: Goal TPN rate is 105 mL/hr (provides 150 g of protein and 2350 kcals per day)  RD Assessment on 8/25: 2300-2500 kcal, 140-160g protein per day  Current Nutrition:  NPO  Plan:  Increase TPN to at 75 mL/hr at 1800 - will provide 108 g protein and 1676 kcal meeting ~75% of estimated needs Electrolytes in TPN: incr Na to 75 mEq/L, incr K to  66mq/L, Ca 533m/L, Mg 5 mEq/L, and Phos 1568m/L. Cl:Ac 1:1 Add standard MVI and trace elements to TPN Initiate Sensitive q4h SSI and adjust as needed  Monitor TPN labs on Mon/Thurs, BMET/Mg/Phos in the AM  Thank you for allowing pharmacy to be a part of this patient's care.  EliAlycia RossettiharmD, BCPS Clinical Pharmacist 07/20/2021 7:05 AM   **Pharmacist phone directory can now be found on [email or web address] ([email or web address]).  Listed under [facility]

## 2021-07-20 NOTE — Progress Notes (Addendum)
Subjective: CC: Patient w/ generalized abdominal pain and occasional nausea. Eakins pouch with suction in place draining stool. TNA ordered.   Objective: Vital signs in last 24 hours: Temp:  [97.4 F (36.3 C)-98.5 F (36.9 C)] 97.4 F (36.3 C) (08/26 0813) Pulse Rate:  [84-134] 134 (08/26 0900) Resp:  [15-30] 25 (08/26 0900) BP: (77-128)/(52-95) 99/73 (08/26 0900) SpO2:  [90 %-99 %] 94 % (08/26 0900) Weight:  [id number] kg] 121 kg (08/25 1333)    Intake/Output from previous day: 08/25 0701 - 08/26 0700 In: 1999 [I.V.:1492.2; IV Piggyback:506.8] Out: 1300 [Urine:1100] Intake/Output this shift: Total I/O In: 907.2 [I.V.:684.7; IV Piggyback:222.4] Out: -   PE: Gen:  Alert, NAD, pleasant Card:  Tachycardic with irregular rhythm Pulm: Normal rate and effort Abd: Soft, mild distension, generalized tenderness, midline wound covered with eakin's pouch and suction draining stool into pouch and cannister. +BS.  Psych: A&Ox3  Skin: no rashes noted, warm and dry  Lab Results:  Recent Labs    07/19/21 0330 07/20/21 0250  WBC 26.4* 12.8*  HGB 8.5* 7.8*  HCT 28.6* 27.1*  PLT 304 270   BMET Recent Labs    07/19/21 0330 07/20/21 0250  NA 131* 132*  K 3.2* 3.1*  CL 99 102  CO2 22 23  GLUCOSE 68* 112*  BUN 17 15  CREATININE 1.36* 1.07*  CALCIUM 6.9* 7.4*   PT/INR Recent Labs    07/18/21 0830  LABPROT 30.6*  INR 2.9*   CMP     Component Value Date/Time   NA 132 (L) 07/20/2021 0250   K 3.1 (L) 07/20/2021 0250   CL 102 07/20/2021 0250   CO2 23 07/20/2021 0250   GLUCOSE 112 (H) 07/20/2021 0250   BUN 15 07/20/2021 0250   CREATININE 1.07 (H) 07/20/2021 0250   CALCIUM 7.4 (L) 07/20/2021 0250   PROT 4.5 (L) 07/20/2021 0250   ALBUMIN 1.4 (L) 07/20/2021 0250   AST 17 07/20/2021 0250   ALT 15 07/20/2021 0250   ALKPHOS 100 07/20/2021 0250   BILITOT 0.7 07/20/2021 0250   GFRNONAA 55 (L) 07/20/2021 0250   GFRAA >90 03/28/2014 0435   Lipase     Component Value  Date/Time   LIPASE 25 03/27/2014 0600    Studies/Results: DG CHEST PORT 1 VIEW  Result Date: 07/19/2021 CLINICAL DATA:  PICC placement. EXAM: PORTABLE CHEST 1 VIEW COMPARISON:  Radiograph earlier today. FINDINGS: Patient is rotated. The previous right upper extremity PICC tip is in the region of the distal brachiocephalic vein/SVC confluence. There is a new left upper extremity PICC with tip overlying the region of the mid SVC. No pneumothorax unchanged cardiomegaly. Irregular left basilar opacities are similar. There is increasing streaky right basilar atelectasis. No visualized pneumothorax. Soft tissue emphysema noted in the right axilla, also seen on abdominopelvic CT 07/18/2021. IMPRESSION: 1. Tip of the new left upper extremity PICC in the mid SVC. Previous right upper extremity PICC tip in the region of the brachiocephalic SVC confluence. 2. Unchanged cardiomegaly and irregular left basilar opacities. Increasing streaky right basilar atelectasis. 3. Soft tissue emphysema in the right axilla, also seen on abdominopelvic CT 07/18/2021. Electronically Signed   By: Keith Rake M.D.   On: 07/19/2021 18:37   DG Chest Port 1 View  Result Date: 07/19/2021 CLINICAL DATA:  COVID-19 Abdominal pain EXAM: PORTABLE ABDOMEN - 1 VIEW; PORTABLE CHEST - 1 VIEW COMPARISON:  Chest radiograph 07/18/2021 FINDINGS: Chest: Right upper extremity PICC terminates in the region of  the upper superior vena cava. Heart is mildly enlarged. No significant pulmonary vascular congestion. Mild opacities at the left lung base favored to be atelectasis/scarring. Lungs are otherwise clear. Abdomen: Subcutaneous emphysema again seen throughout the abdominal and pelvic walls, similar to CT of the abdomen and pelvis from 07/18/2021. No dilated loops of bowel to indicate ileus or obstruction. There is levoconvex curvature of the lumbar spine. IMPRESSION: 1. Mild cardiomegaly. 2. Left basilar opacity favored to be atelectasis. 3.  Redemonstration of diffuse abdominal wall emphysema. 4. Nonobstructive bowel gas pattern. Electronically Signed   By: Miachel Roux M.D.   On: 07/19/2021 07:46   DG CHEST PORT 1 VIEW  Result Date: 07/18/2021 CLINICAL DATA:  PICC line placement EXAM: PORTABLE CHEST 1 VIEW COMPARISON:  07/18/2021 FINDINGS: Right upper extremity PICC line is been placed with its tip overlying the expected superior vena cava. Lung volumes are small. Small left pleural effusion is stable. No pneumothorax. Cardiac size is mildly enlarged, unchanged. Pulmonary vascularity is normal. IMPRESSION: Right upper extremity PICC line tip within the superior vena cava. Small left pleural effusion, unchanged. Electronically Signed   By: Fidela Salisbury M.D.   On: 07/18/2021 22:34   DG Chest Portable 1 View  Result Date: 07/18/2021 CLINICAL DATA:  PICC line placement. EXAM: PORTABLE CHEST 1 VIEW COMPARISON:  July 18, 2021. FINDINGS: Stable cardiomediastinal silhouette. Interval placement of right-sided PICC line with distal tip in expected position of the SVC. Mild bibasilar subsegmental atelectasis is noted with small pleural effusions. Bony thorax is unremarkable. IMPRESSION: Interval placement of right-sided PICC line with distal tip in expected position of the SVC. Mild bibasilar subsegmental atelectasis with small pleural effusions. Electronically Signed   By: Marijo Conception M.D.   On: 07/18/2021 15:01   DG Abd Portable 1V  Result Date: 07/19/2021 CLINICAL DATA:  COVID-19 Abdominal pain EXAM: PORTABLE ABDOMEN - 1 VIEW; PORTABLE CHEST - 1 VIEW COMPARISON:  Chest radiograph 07/18/2021 FINDINGS: Chest: Right upper extremity PICC terminates in the region of the upper superior vena cava. Heart is mildly enlarged. No significant pulmonary vascular congestion. Mild opacities at the left lung base favored to be atelectasis/scarring. Lungs are otherwise clear. Abdomen: Subcutaneous emphysema again seen throughout the abdominal and pelvic  walls, similar to CT of the abdomen and pelvis from 07/18/2021. No dilated loops of bowel to indicate ileus or obstruction. There is levoconvex curvature of the lumbar spine. IMPRESSION: 1. Mild cardiomegaly. 2. Left basilar opacity favored to be atelectasis. 3. Redemonstration of diffuse abdominal wall emphysema. 4. Nonobstructive bowel gas pattern. Electronically Signed   By: Miachel Roux M.D.   On: 07/19/2021 07:46   [place] EKG SITE RITE  Result Date: 07/18/2021 If [facility] image not attached, placement could not be confirmed due to current cardiac rhythm.  [place] EKG SITE RITE  Result Date: 07/18/2021 If [facility] image not attached, placement could not be confirmed due to current cardiac rhythm.   Anti-infectives: Anti-infectives (From admission, onward)    Start     Dose/Rate Route Frequency Ordered Stop   07/20/21 1000  remdesivir 100 mg in sodium chloride 0.9 % 100 mL IVPB       See Hyperspace for full Linked Orders Report.   100 mg 200 mL/hr over 30 Minutes Intravenous Daily 07/19/21 1040 07/22/21 0959   07/20/21 0200  vancomycin (VANCOREADY) IVPB 750 mg/150 mL  Status:  Discontinued        750 mg 150 mL/hr over 60 Minutes Intravenous Every 24 hours  07/19/21 1036 07/19/21 1449   07/19/21 2300  anidulafungin (ERAXIS) 100 mg in sodium chloride 0.9 % 100 mL IVPB  Status:  Discontinued        100 mg 78 mL/hr over 100 Minutes Intravenous Every 24 hours 07/18/21 2107 07/19/21 1449   07/19/21 1600  vancomycin (VANCOREADY) IVPB 750 mg/150 mL  Status:  Discontinued        750 mg 150 mL/hr over 60 Minutes Intravenous Every 24 hours 07/19/21 0919 07/19/21 1016   07/19/21 1600  vancomycin (VANCOREADY) IVPB 750 mg/150 mL  Status:  Discontinued        750 mg 150 mL/hr over 60 Minutes Intravenous Every 24 hours 07/19/21 1016 07/19/21 1036   07/19/21 1400  piperacillin-tazobactam (ZOSYN) IVPB 3.375 g        3.375 g 12.5 mL/hr over 240 Minutes Intravenous Every 8 hours 07/19/21 1016     07/19/21  1130  remdesivir 200 mg in sodium chloride 0.9% 250 mL IVPB       See Hyperspace for full Linked Orders Report.   200 mg 580 mL/hr over 30 Minutes Intravenous Once 07/19/21 1040 07/19/21 1241   07/19/21 0100  metroNIDAZOLE (FLAGYL) IVPB 500 mg  Status:  Discontinued        500 mg 100 mL/hr over 60 Minutes Intravenous Every 12 hours 07/18/21 1203 07/18/21 1534   07/18/21 2200  ceFEPIme (MAXIPIME) 2 g in sodium chloride 0.9 % 100 mL IVPB  Status:  Discontinued        2 g 200 mL/hr over 30 Minutes Intravenous Every 12 hours 07/18/21 1156 07/18/21 1203   07/18/21 2200  meropenem (MERREM) 2 g in sodium chloride 0.9 % 100 mL IVPB  Status:  Discontinued        2 g 200 mL/hr over 30 Minutes Intravenous Every 12 hours 07/18/21 2104 07/19/21 1011   07/18/21 2200  anidulafungin (ERAXIS) 200 mg in sodium chloride 0.9 % 200 mL IVPB        200 mg 78 mL/hr over 200 Minutes Intravenous  Once 07/18/21 2108 07/19/21 0419   07/18/21 1800  clindamycin (CLEOCIN) IVPB 900 mg  Status:  Discontinued        900 mg 100 mL/hr over 30 Minutes Intravenous Every 8 hours 07/18/21 1534 07/18/21 2103   07/18/21 1600  vancomycin (VANCOREADY) IVPB 2000 mg/400 mL  Status:  Discontinued        2,000 mg 200 mL/hr over 120 Minutes Intravenous Every 24 hours 07/18/21 1544 07/19/21 0919   07/18/21 1545  piperacillin-tazobactam (ZOSYN) IVPB 3.375 g  Status:  Discontinued        3.375 g 12.5 mL/hr over 240 Minutes Intravenous Every 8 hours 07/18/21 1544 07/18/21 2104   07/18/21 1300  cefTRIAXone (ROCEPHIN) 2 g in sodium chloride 0.9 % 100 mL IVPB  Status:  Discontinued        2 g 200 mL/hr over 30 Minutes Intravenous Every 24 hours 07/18/21 1203 07/18/21 1628   07/18/21 1200  ceFEPIme (MAXIPIME) 2 g in sodium chloride 0.9 % 100 mL IVPB  Status:  Discontinued        2 g 200 mL/hr over 30 Minutes Intravenous Every 12 hours 07/18/21 1155 07/18/21 1156   07/18/21 0930  ceFEPIme (MAXIPIME) 2 g in sodium chloride 0.9 % 100 mL IVPB         2 g 200 mL/hr over 30 Minutes Intravenous  Once 07/18/21 0918 07/18/21 1135   07/18/21 0930  metroNIDAZOLE (FLAGYL) IVPB 500  mg  Status:  Discontinued        500 mg 100 mL/hr over 60 Minutes Intravenous  Once 07/18/21 [id number] 07/18/21 1534        Assessment/Plan 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 Arnoldo Morale at [place] on 8/12 - path w/ adenocarcinoma  - Midline wound opened by Dr. Arnoldo Morale at bedside 8/24 and now has stool decompressing through CC fistula  - Currently has eakin's pouch with suction to aid in collection of output. WOCN following - Keep NPO. TPN and monitor strict I/O - Cont abx - I discussed with CCM in person. Appreciate their assistance.    FEN - NPO. TPN. IVF per Pharm/CCM. K 3.1, Na 132 VTE - SCDs, on heparin gtt ID - Narrowed to Zosyn. Afebrile. WBC 26.4 > 12.8   COVID + Sepsis - off pressors, improved. Appreciate ccm's assistance  A. Fib on Xarelto at home - on heparin gtt  ABL anemia - FOBT positive 8/24. Monitor. Hgb 9.8 > 8.5 > 7.8. Monitor  Hypothyroidism AKI - improved, Cr 1.07 from 1.58 Protein calorie malnutrition - albumin 1.4. Check pre-alb. On TNA   LOS: 2 days    Jillyn Ledger , [facility] 07/20/2021, 10:04 AM Please see Amion for pager number during day hours 7:00am-4:30pm

## 2021-07-20 NOTE — Progress Notes (Signed)
[place] for IV Heparin  Indication: atrial fibrillation  Allergies  Allergen Reactions   Codeine Itching   Compazine [Prochlorperazine Edisylate]    Prednisone Nausea And Vomiting   Tetanus Toxoids Other (See Comments)    Knot like "hen egg" came up    Vicodin [Hydrocodone-Acetaminophen] Itching    Patient Measurements: Height: '5\' 2"'$  (157.5 cm) Weight: 121 kg (266 lb 12.1 oz) IBW/kg (Calculated) : 50.1 Heparin Dosing Weight: 78 kg  Vital Signs: Temp: 97.8 F (36.6 C) (08/26 1909) Temp Source: Oral (08/26 1909) BP: 86/57 (08/26 1925) Pulse Rate: 113 (08/26 1925)  Labs: Recent Labs    07/18/21 0830 07/19/21 0330 07/20/21 0250 07/20/21 1208 07/20/21 2140  HGB 9.8* 8.5* 7.8*  --   --   HCT 34.2* 28.6* 27.1*  --   --   PLT 318 304 270  --   --   APTT 33  --  59*  --   --   LABPROT 30.6*  --   --   --   --   INR 2.9*  --   --   --   --   HEPARINUNFRC  --   --  0.11* <0.10* <0.10*  CREATININE 1.58* 1.36* 1.07*  --   --     Estimated Creatinine Clearance: 58.9 mL/min (A) (by C-G formula based on SCr of 1.07 mg/dL (H)).  Assessment: 75 y.o. female with recent hospitalization from 07/03/21-07/17/21 with anemia from GI bleeding secondary to cecal mass, S/P R hemicolectomy on 07/06/21. Pt has history of Afib on Xarelto PTA (on hold because patient is NPO to allow bowel rest). Pt has had multiple subtherapeutic heparin levels. I spoke with RN who admitted pt and has worked with pt during her stay at [facility] -  he told me that the PICC line that she had on admission malfunctioned, so a new PICC line was placed. RN said that IV line has been working well this evening.   Most recent heparin level remains undetectable on heparin infusion at 1150 units/hr. H//H 7.8 and trending down since admission; plt 270. Per RN, PICC working well and no bleeding observed.  Goal of Therapy:  Heparin level 0.3-0.5 units/mL Monitor platelets by  anticoagulation protocol: Yes   Plan: Increase heparin infusion to 1300 units/hr Check 8-hour heparin level Monitor daily CBC and heparin level Monitor for bleeding  Gillermina Hu, PharmD, BCPS, [facility] Clinical Pharmacist

## 2021-07-20 NOTE — Progress Notes (Signed)
ANTICOAGULATION CONSULT NOTE Pharmacy Consult for Heparin  Indication: atrial fibrillation  Allergies  Allergen Reactions   Codeine Itching   Compazine [Prochlorperazine Edisylate]    Prednisone Nausea And Vomiting   Tetanus Toxoids Other (See Comments)    Knot like "hen egg" came up    Vicodin [Hydrocodone-Acetaminophen] Itching    Patient Measurements: Height: '5\' 2"'$  (157.5 cm) Weight: 121 kg (266 lb 12.1 oz) IBW/kg (Calculated) : 50.1 Heparin Dosing Weight: 78 kg  Vital Signs: Temp: 98.5 F (36.9 C) (08/25 2341) Temp Source: Oral (08/25 2341) BP: 128/69 (08/26 0309) Pulse Rate: 97 (08/26 0309)  Labs: Recent Labs    07/18/21 0830 07/19/21 0330 07/20/21 0250  HGB 9.8* 8.5* 7.8*  HCT 34.2* 28.6* 27.1*  PLT 318 304 270  APTT 33  --   --   LABPROT 30.6*  --   --   INR 2.9*  --   --   HEPARINUNFRC  --   --  0.11*  CREATININE 1.58* 1.36*  --      Estimated Creatinine Clearance: 46.3 mL/min (A) (by C-G formula based on SCr of 1.36 mg/dL (H)).  Assessment: 75 y.o. female s/p hemicolectomy 8/12, h/o Afib and Eliqus on hold, for heparin.  Heparin level low.   Goal of Therapy:  Heparin level 0.3-0.5 units/mL Monitor platelets by anticoagulation protocol: Yes   Plan:  Increase Heparin  1150 units/hr Check heparin level in 8 hours.  Phillis Knack, PharmD, BCPS

## 2021-07-21 DIAGNOSIS — C189 Malignant neoplasm of colon, unspecified: Secondary | ICD-10-CM | POA: Diagnosis not present

## 2021-07-21 DIAGNOSIS — N179 Acute kidney failure, unspecified: Secondary | ICD-10-CM | POA: Diagnosis not present

## 2021-07-21 DIAGNOSIS — L03311 Cellulitis of abdominal wall: Secondary | ICD-10-CM | POA: Diagnosis not present

## 2021-07-21 DIAGNOSIS — A419 Sepsis, unspecified organism: Secondary | ICD-10-CM | POA: Diagnosis not present

## 2021-07-21 LAB — BASIC METABOLIC PANEL
Anion gap: 4 — ABNORMAL LOW (ref 5–15)
BUN: 14 mg/dL (ref 8–23)
CO2: 25 mmol/L (ref 22–32)
Calcium: 7.5 mg/dL — ABNORMAL LOW (ref 8.9–10.3)
Chloride: 105 mmol/L (ref 98–111)
Creatinine, Ser: 0.85 mg/dL (ref 0.44–1.00)
GFR, Estimated: >60 mL/min (ref >60)
Glucose, Bld: 110 mg/dL — ABNORMAL HIGH (ref 70–99)
Potassium: 3.9 mmol/L (ref 3.5–5.1)
Sodium: 134 mmol/L — ABNORMAL LOW (ref 135–145)

## 2021-07-21 LAB — CBC
HCT: 26.6 % — ABNORMAL LOW (ref 36.0–46.0)
Hemoglobin: 7.7 g/dL — ABNORMAL LOW (ref 12.0–15.0)
MCH: 22 pg — ABNORMAL LOW (ref 26.0–34.0)
MCHC: 28.9 g/dL — ABNORMAL LOW (ref 30.0–36.0)
MCV: 76 fL — ABNORMAL LOW (ref 80.0–100.0)
Platelets: 238 10*3/uL (ref 150–400)
RBC: 3.5 MIL/uL — ABNORMAL LOW (ref 3.87–5.11)
RDW: 28 % — ABNORMAL HIGH (ref 11.5–15.5)
WBC: 9.7 10*3/uL (ref 4.0–10.5)
nRBC: 0 % (ref 0.0–0.2)

## 2021-07-21 LAB — HEPARIN LEVEL (UNFRACTIONATED)
Heparin Unfractionated: 0.15 IU/mL — ABNORMAL LOW (ref 0.30–0.70)
Heparin Unfractionated: 0.11 IU/mL — ABNORMAL LOW (ref 0.30–0.70)

## 2021-07-21 LAB — GLUCOSE, CAPILLARY
Glucose-Capillary: 103 mg/dL — ABNORMAL HIGH (ref 70–99)
Glucose-Capillary: 109 mg/dL — ABNORMAL HIGH (ref 70–99)

## 2021-07-21 LAB — MAGNESIUM: Magnesium: 1.8 mg/dL (ref 1.7–2.4)

## 2021-07-21 LAB — PHOSPHORUS: Phosphorus: 2.2 mg/dL — ABNORMAL LOW (ref 2.5–4.6)

## 2021-07-21 MED ORDER — POTASSIUM PHOSPHATES 15 MMOLE/5ML IV SOLN
20.0000 mmol | Freq: Once | INTRAVENOUS | Status: AC
  Administered 2021-07-21: 20 mmol via INTRAVENOUS
  Filled 2021-07-21: qty 6.67

## 2021-07-21 MED ORDER — ORAL CARE MOUTH RINSE
15.0000 mL | Freq: Two times a day (BID) | OROMUCOSAL | Status: DC
  Administered 2021-07-21 – 2021-09-06 (×86): 15 mL via OROMUCOSAL

## 2021-07-21 MED ORDER — TRAVASOL 10 % IV SOLN
INTRAVENOUS | Status: AC
  Filled 2021-07-21: qty 1512

## 2021-07-21 MED ORDER — DIPHENHYDRAMINE HCL 50 MG/ML IJ SOLN
12.5000 mg | Freq: Four times a day (QID) | INTRAMUSCULAR | Status: DC | PRN
  Administered 2021-07-30 – 2021-08-09 (×5): 12.5 mg via INTRAVENOUS
  Filled 2021-07-21 (×5): qty 1

## 2021-07-21 MED ORDER — INSULIN ASPART 100 UNIT/ML IJ SOLN: 0.0000 [IU] | Freq: Three times a day (TID) | INTRAMUSCULAR | Status: DC

## 2021-07-21 MED ORDER — MAGNESIUM SULFATE 2 GM/50ML IV SOLN
2.0000 g | Freq: Once | INTRAVENOUS | Status: AC
  Administered 2021-07-21: 2 g via INTRAVENOUS
  Filled 2021-07-21: qty 50

## 2021-07-21 NOTE — Progress Notes (Signed)
[place] for IV Heparin  Indication: atrial fibrillation  Allergies  Allergen Reactions   Codeine Itching   Compazine [Prochlorperazine Edisylate]    Prednisone Nausea And Vomiting   Tetanus Toxoids Other (See Comments)    Knot like "hen egg" came up    Vicodin [Hydrocodone-Acetaminophen] Itching    Patient Measurements: Height: '5\' 2"'$  (157.5 cm) Weight: 121 kg (266 lb 12.1 oz) IBW/kg (Calculated) : 50.1 Heparin Dosing Weight: 78 kg  Vital Signs: Temp: 97.9 F (36.6 C) (08/27 0321) Temp Source: Oral (08/27 0321) BP: 106/89 (08/27 0526) Pulse Rate: 88 (08/27 0200)  Labs: Recent Labs    07/18/21 0830 07/19/21 0330 07/19/21 0330 07/20/21 0250 07/20/21 1208 07/20/21 2140 07/21/21 0611  HGB 9.8* 8.5*  --  7.8*  --   --  7.7*  HCT 34.2* 28.6*  --  27.1*  --   --  26.6*  PLT 318 304  --  270  --   --  238  APTT 33  --   --  59*  --   --   --   LABPROT 30.6*  --   --   --   --   --   --   INR 2.9*  --   --   --   --   --   --   HEPARINUNFRC  --   --    < > 0.11* <0.10* <0.10* 0.15*  CREATININE 1.58* 1.36*  --  1.07*  --   --   --    < > = values in this interval not displayed.    Estimated Creatinine Clearance: 58.9 mL/min (A) (by C-G formula based on SCr of 1.07 mg/dL (H)).  Assessment: 75 y.o. female with recent hospitalization from 07/03/21-07/17/21 with anemia from GI bleeding secondary to cecal mass, S/P R hemicolectomy on 07/06/21. Pt has history of Afib on Xarelto PTA (on hold because patient is NPO to allow bowel rest). Pt has had multiple subtherapeutic heparin levels. I spoke with RN who admitted pt and has worked with pt during her stay at [facility] -  he told me that the PICC line that she had on admission malfunctioned, so a new PICC line was placed. RN said that IV line has been working well this evening.   Most recent heparin level remains undetectable on heparin infusion at 1150 units/hr. H//H 7.8 and trending  down since admission; plt 270. Per RN, PICC working well and no bleeding observed.  8/27 AM update:  Heparin level low but trending up  Goal of Therapy:  Heparin level 0.3-0.5 units/mL Monitor platelets by anticoagulation protocol: Yes   Plan: Increase heparin infusion to 1450 units/hr Check 8-hour heparin level Monitor daily CBC and heparin level Monitor for bleeding  Narda Bonds, PharmD, BCPS Clinical Pharmacist Phone: [phone]

## 2021-07-21 NOTE — Progress Notes (Signed)
PROGRESS NOTE  Lauren [name] [id number] DOB: 11-21-1946 DOA: 07/18/2021 PCP: Pcp, No   LOS: 3 days   Brief narrative:  Patient is 75 years old female with past medical history of atrial fibrillation, combined heart failure, hypertension, lymphedema, recent admission to the hospital for GI bleed secondary to cecal mass status post right hemicolectomy on 07/06/2021 at outside hospital.  Pathology consistent with metastatic adenocarcinoma was at [facility] skilled nursing facility when she was noted to be hypotensive and complained of nausea abdominal pain and back pain.  She was also found to be COVID positive.  Patient was then brought in hospital and received IV fluids vasopressors antibiotic.  CT scan of the abdomen showed the fistula extending from the bowel adjacent to the suture through an umbilical hernia into the abdominal wall with fluid collection in the anterior abdominal wall and gas tracking.  Patient was admitted to the ICU on 07/18/2021 and was started on vasopressors and antibiotic.  On 8/25 patient was started on TPN and was taken off of Levophed.  Subsequently, patient was considered stable for transfer out of ICU.Marland Kitchen  Assessment/Plan:  Principal Problem:   Septic shock (HCC) Active Problems:   Hypokalemia   Morbid obesity with BMI of 50.0-59.9, adult (HCC)   Colon carcinoma ([place])   Enterocutaneous fistula   Cellulitis   AKI (acute kidney injury) ([place])   Hyponatremia  Septic shock with abdominal wall fluid collection- improved.  Was on vasopressors which has been weaned off.  Continue antibiotic.  Blood cultures negative in 3 days.  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  T3 N2 M0 status..  Scan showing some fistula with abdominal wall collection.  Surgery on board.  Continue TPN n.p.o. bowel rest and antibiotics.  Currently on Zosyn, at least for 7 days currently, day 3/7.Marland Kitchen  Midline wound opened by Dr. Arnoldo Morale at bedside on 8/24 with stool decompressing. Eakins pouch to abd wound. Managed per surgery.   Incidental COVID 19 positive. Day 3 remdesivir.  On room air.   Chronic combined CHF, history of atrial fibrillation Mildly tachycardic.  Patient was on Xarelto as outpatient.  On metoprolol IV every 6hourly.  Will on heparin drip.  Could transition to Lovenox if no surgical intervention planned.  AKI Improved.  Latest creatinine of 0.8.   Anemia, microcytic No signs of active bleeding.  We will continue to monitor.  Latest hemoglobin of 7.7.   Hyponatremia,  Mild.  Continue to monitor  Hypokalemia Improved after replacement.  Latest potassium of 3.9  Hypomagnesia-Resolved Latest magnesium of 1.8 on TPN.  We will closely monitor electrolytes.  DVT prophylaxis: Place and maintain sequential compression device Start: 07/18/21 1202 Place TED hose Start: 07/18/21 1202   Code Status: Full code  Family Communication: None   Status is: Inpatient  Remains inpatient appropriate because:Unsafe d/c plan, IV treatments appropriate due to intensity of illness or inability to take PO, and Inpatient level of care appropriate due to severity of illness  Dispo: The patient is from: SNF              Anticipated d/c is to: SNF              Patient currently is not medically stable to d/c.   Difficult to place patient No   Consultants: General surgery PCCM  Procedures: PICC line placement  Anti-infectives:  Remdesivir Zosyn IV   Anti-infectives (From admission, onward)  Start     Dose/Rate Route Frequency Ordered Stop   07/20/21 1000  remdesivir 100 mg in sodium chloride 0.9 % 100 mL IVPB       See Hyperspace for full Linked Orders Report.   100 mg 200 mL/hr over 30 Minutes Intravenous Daily 07/19/21 1040 07/22/21 0959   07/20/21 0200  vancomycin (VANCOREADY) IVPB 750 mg/150 mL  Status:  Discontinued        750 mg 150 mL/hr over 60 Minutes  Intravenous Every 24 hours 07/19/21 1036 07/19/21 1449   07/19/21 2300  anidulafungin (ERAXIS) 100 mg in sodium chloride 0.9 % 100 mL IVPB  Status:  Discontinued        100 mg 78 mL/hr over 100 Minutes Intravenous Every 24 hours 07/18/21 2107 07/19/21 1449   07/19/21 1600  vancomycin (VANCOREADY) IVPB 750 mg/150 mL  Status:  Discontinued        750 mg 150 mL/hr over 60 Minutes Intravenous Every 24 hours 07/19/21 0919 07/19/21 1016   07/19/21 1600  vancomycin (VANCOREADY) IVPB 750 mg/150 mL  Status:  Discontinued        750 mg 150 mL/hr over 60 Minutes Intravenous Every 24 hours 07/19/21 1016 07/19/21 1036   07/19/21 1400  piperacillin-tazobactam (ZOSYN) IVPB 3.375 g        3.375 g 12.5 mL/hr over 240 Minutes Intravenous Every 8 hours 07/19/21 1016     07/19/21 1130  remdesivir 200 mg in sodium chloride 0.9% 250 mL IVPB       See Hyperspace for full Linked Orders Report.   200 mg 580 mL/hr over 30 Minutes Intravenous Once 07/19/21 1040 07/19/21 1241   07/19/21 0100  metroNIDAZOLE (FLAGYL) IVPB 500 mg  Status:  Discontinued        500 mg 100 mL/hr over 60 Minutes Intravenous Every 12 hours 07/18/21 1203 07/18/21 1534   07/18/21 2200  ceFEPIme (MAXIPIME) 2 g in sodium chloride 0.9 % 100 mL IVPB  Status:  Discontinued        2 g 200 mL/hr over 30 Minutes Intravenous Every 12 hours 07/18/21 1156 07/18/21 1203   07/18/21 2200  meropenem (MERREM) 2 g in sodium chloride 0.9 % 100 mL IVPB  Status:  Discontinued        2 g 200 mL/hr over 30 Minutes Intravenous Every 12 hours 07/18/21 2104 07/19/21 1011   07/18/21 2200  anidulafungin (ERAXIS) 200 mg in sodium chloride 0.9 % 200 mL IVPB        200 mg 78 mL/hr over 200 Minutes Intravenous  Once 07/18/21 2108 07/19/21 0419   07/18/21 1800  clindamycin (CLEOCIN) IVPB 900 mg  Status:  Discontinued        900 mg 100 mL/hr over 30 Minutes Intravenous Every 8 hours 07/18/21 1534 07/18/21 2103   07/18/21 1600  vancomycin (VANCOREADY) IVPB 2000 mg/400 mL   Status:  Discontinued        2,000 mg 200 mL/hr over 120 Minutes Intravenous Every 24 hours 07/18/21 1544 07/19/21 0919   07/18/21 1545  piperacillin-tazobactam (ZOSYN) IVPB 3.375 g  Status:  Discontinued        3.375 g 12.5 mL/hr over 240 Minutes Intravenous Every 8 hours 07/18/21 1544 07/18/21 2104   07/18/21 1300  cefTRIAXone (ROCEPHIN) 2 g in sodium chloride 0.9 % 100 mL IVPB  Status:  Discontinued        2 g 200 mL/hr over 30 Minutes Intravenous Every 24 hours 07/18/21 1203 07/18/21 1628   07/18/21  1200  ceFEPIme (MAXIPIME) 2 g in sodium chloride 0.9 % 100 mL IVPB  Status:  Discontinued        2 g 200 mL/hr over 30 Minutes Intravenous Every 12 hours 07/18/21 1155 07/18/21 1156   07/18/21 0930  ceFEPIme (MAXIPIME) 2 g in sodium chloride 0.9 % 100 mL IVPB        2 g 200 mL/hr over 30 Minutes Intravenous  Once 07/18/21 0918 07/18/21 1135   07/18/21 0930  metroNIDAZOLE (FLAGYL) IVPB 500 mg  Status:  Discontinued        500 mg 100 mL/hr over 60 Minutes Intravenous  Once 07/18/21 0918 07/18/21 1534        Subjective: Today, patient was seen and examined at bedside.  Patient complains of generalized body pain.  Feels discomfort and states that she has not been given much attention.  Denies any nausea vomiting or bowel movement.  Has been urinating well.  Objective: Vitals:   07/21/21 0321 07/21/21 0526  BP: (!) 116/42 106/89  Pulse:    Resp:    Temp: 97.9 F (36.6 C)   SpO2: 99%     Intake/Output Summary (Last 24 hours) at 07/21/2021 0810 Last data filed at 07/21/2021 0100 Gross per 24 hour  Intake 815.76 ml  Output 1050 ml  Net -234.24 ml   Filed Weights   07/18/21 0721 07/18/21 2048 07/19/21 1333  Weight: 114.9 kg 116.2 kg 121 kg   Body mass index is 48.79 kg/m.   Physical Exam:  GENERAL: Patient is alert awake and oriented. Not in obvious distress.  Morbidly obese HENT: No scleral pallor or icterus. Pupils equally reactive to light. Oral mucosa is dry NECK: is  supple, no gross swelling noted. CHEST: Clear to auscultation. No crackles or wheezes.  Diminished breath sounds bilaterally. CVS: S1 and S2 heard, no murmur. Regular rate and rhythm.  ABDOMEN: Soft, non-tender,  Midline colostomy with feculent output.  Hypoactive bowel sounds EXTREMITIES: No edema.  Left upper extremity PICC line in place CNS: Cranial nerves are intact. No focal motor deficits. SKIN: warm and dry without rashes.  Data Review: I have personally reviewed the following laboratory data and studies,  CBC: Recent Labs  Lab 07/15/21 0616 07/18/21 0830 07/19/21 0330 07/20/21 0250 07/21/21 0611  WBC 10.3 33.1* 26.4* 12.8* 9.7  NEUTROABS  --  28.8*  --   --   --   HGB 8.6* 9.8* 8.5* 7.8* 7.7*  HCT 30.3* 34.2* 28.6* 27.1* 26.6*  MCV 76.9* 77.9* 74.9* 75.5* 76.0*  PLT 264 318 304 270 [id number]   Basic Metabolic Panel: Recent Labs  Lab 07/15/21 0616 07/18/21 0830 07/19/21 0330 07/19/21 2120 07/20/21 0250 07/21/21 0611  NA 133* 129* 131*  --  132* 134*  K 3.4* 2.8* 3.2*  --  3.1* 3.9  CL 100 94* 99  --  102 105  CO2 '26 24 22  '$ --  23 25  GLUCOSE 82 83 68*  --  112* 110*  BUN '14 17 17  '$ --  15 14  CREATININE 0.94 1.58* 1.36*  --  1.07* 0.85  CALCIUM 7.8* 7.2* 6.9*  --  7.4* 7.5*  MG  --   --  1.3* 2.3 2.2 1.8  PHOS  --   --  3.6  --  3.1 2.2*   Liver Function Tests: Recent Labs  Lab 07/18/21 0830 07/19/21 0330 07/20/21 0250  AST '28 25 17  '$ ALT '17 17 15  '$ ALKPHOS 102 76 100  BILITOT  1.7* 1.1 0.7  PROT 5.4* 4.5* 4.5*  ALBUMIN 2.0* 1.5* 1.4*   No results for input(s): LIPASE, AMYLASE in the last 168 hours. No results for input(s): AMMONIA in the last 168 hours. Cardiac Enzymes: No results for input(s): CKTOTAL, CKMB, CKMBINDEX, TROPONINI in the last 168 hours. BNP (last 3 results) No results for input(s): BNP in the last 8760 hours.  ProBNP (last 3 results) No results for input(s): PROBNP in the last 8760 hours.  CBG: Recent Labs  Lab 07/20/21 1107  07/20/21 1527 07/20/21 1909 07/20/21 2346 07/21/21 0403  GLUCAP 124* 101* 101* 103* 103*   Recent Results (from the past 240 hour(s))  C Difficile Quick Screen (NO PCR Reflex)     Status: None   Collection Time: 07/16/21  1:09 AM   Specimen: STOOL  Result Value Ref Range Status   C Diff antigen NEGATIVE NEGATIVE Final   C Diff toxin NEGATIVE NEGATIVE Final   C Diff interpretation No C. difficile detected.  Final    Comment: Performed at [facility], [address], [place], [place] [address]  Blood culture (routine single)     Status: None (Preliminary result)   Collection Time: 07/18/21  8:35 AM   Specimen: Right Antecubital; Blood  Result Value Ref Range Status   Specimen Description RIGHT ANTECUBITAL  Final   Special Requests   Final    BOTTLES DRAWN AEROBIC AND ANAEROBIC Blood Culture adequate volume   Culture   Final    NO GROWTH 3 DAYS Performed at [facility], [address], [place], [place] [address]    Report Status PENDING  Incomplete  Resp Panel by RT-PCR (Flu A&B, Covid) Nasopharyngeal Swab     Status: Abnormal   Collection Time: 07/18/21  9:18 AM   Specimen: Nasopharyngeal Swab; Nasopharyngeal(NP) swabs in vial transport medium  Result Value Ref Range Status   SARS Coronavirus 2 by RT PCR POSITIVE (A) NEGATIVE Final    Comment: RESULT CALLED TO, READ BACK BY AND VERIFIED WITH: OAKLY,B AT 1425 BY HUFFINES,S ON 07/18/21. (NOTE) SARS-CoV-2 target nucleic acids are DETECTED.  The SARS-CoV-2 RNA is generally detectable in upper respiratory specimens during the acute phase of infection. Positive results are indicative of the presence of the identified virus, but do not rule out bacterial infection or co-infection with other pathogens not detected by the test. Clinical correlation with patient history and other diagnostic information is necessary to determine patient infection status. The expected result is Negative.  Fact Sheet for  Patients: EntrepreneurPulse.com.au  Fact Sheet for Healthcare Providers: IncredibleEmployment.be  This test is not yet approved or cleared by the [place] FDA and  has been authorized for detection and/or diagnosis of SARS-CoV-2 by FDA under an Emergency Use Authorization (EUA).  This EUA will remain in effect (meaning this te st can be used) for the duration of  the COVID-19 declaration under Section 564(b)(1) of the Act, 21 U.S.C. section 360bbb-3(b)(1), unless the authorization is terminated or revoked sooner.     Influenza A by PCR NEGATIVE NEGATIVE Final   Influenza B by PCR NEGATIVE NEGATIVE Final    Comment: (NOTE) The Xpert Xpress SARS-CoV-2/FLU/RSV plus assay is intended as an aid in the diagnosis of influenza from Nasopharyngeal swab specimens and should not be used as a sole basis for treatment. Nasal washings and aspirates are unacceptable for Xpert Xpress SARS-CoV-2/FLU/RSV testing.  Fact Sheet for Patients: EntrepreneurPulse.com.au  Fact Sheet for Healthcare Providers: IncredibleEmployment.be  This test is not yet approved or  cleared by the [place] and has been authorized for detection and/or diagnosis of SARS-CoV-2 by FDA under an Emergency Use Authorization (EUA). This EUA will remain in effect (meaning this test can be used) for the duration of the COVID-19 declaration under Section 564(b)(1) of the Act, 21 U.S.C. section 360bbb-3(b)(1), unless the authorization is terminated or revoked.  Performed at [facility], [address] [place], [place] [address]   Blood Culture (routine x 2)     Status: None (Preliminary result)   Collection Time: 07/18/21 10:22 AM   Specimen: BLOOD  Result Value Ref Range Status   Specimen Description BLOOD RIGHT ANTECUBITAL  Final   Special Requests   Final    BOTTLES DRAWN AEROBIC AND ANAEROBIC Blood Culture adequate volume   Culture    Final    NO GROWTH 3 DAYS Performed at [facility], [address] [place], [place] [address]    Report Status PENDING  Incomplete  MRSA Next Gen by PCR, Nasal     Status: None   Collection Time: 07/18/21  8:51 PM   Specimen: Nasal Mucosa; Nasal Swab  Result Value Ref Range Status   MRSA by PCR Next Gen NOT DETECTED NOT DETECTED Final    Comment: (NOTE) The GeneXpert MRSA Assay (FDA approved for NASAL specimens only), is one component of a comprehensive MRSA colonization surveillance program. It is not intended to diagnose MRSA infection nor to guide or monitor treatment for MRSA infections. Test performance is not FDA approved in patients less than 67 years old. Performed at [facility], [place] [address], [place], [place] [id number]      Studies: DG CHEST PORT 1 VIEW  Result Date: 07/19/2021 CLINICAL DATA:  PICC placement. EXAM: PORTABLE CHEST 1 VIEW COMPARISON:  Radiograph earlier today. FINDINGS: Patient is rotated. The previous right upper extremity PICC tip is in the region of the distal brachiocephalic vein/SVC confluence. There is a new left upper extremity PICC with tip overlying the region of the mid SVC. No pneumothorax unchanged cardiomegaly. Irregular left basilar opacities are similar. There is increasing streaky right basilar atelectasis. No visualized pneumothorax. Soft tissue emphysema noted in the right axilla, also seen on abdominopelvic CT 07/18/2021. IMPRESSION: 1. Tip of the new left upper extremity PICC in the mid SVC. Previous right upper extremity PICC tip in the region of the brachiocephalic SVC confluence. 2. Unchanged cardiomegaly and irregular left basilar opacities. Increasing streaky right basilar atelectasis. 3. Soft tissue emphysema in the right axilla, also seen on abdominopelvic CT 07/18/2021. Electronically Signed   By: Keith Rake M.D.   On: 07/19/2021 18:37      Flora Lipps, MD  [facility] 07/21/2021  If 7PM-7AM, please contact  night-coverage

## 2021-07-21 NOTE — Progress Notes (Addendum)
Subjective: CC: Generalized abdominal pain is stable to slightly improved this am. Eakins pouch with suction in place draining stool. On TNA. Needs strict I/O to know how much is coming from CC fistula. Afebrile.   Objective: Vital signs in last 24 hours: Temp:  [97.7 F (36.5 C)-98.1 F (36.7 C)] 97.9 F (36.6 C) (08/27 0321) Pulse Rate:  [66-142] 88 (08/27 0200) Resp:  [12-30] 16 (08/27 0200) BP: (75-127)/(42-111) 106/89 (08/27 0526) SpO2:  [87 %-99 %] 99 % (08/27 0321)    Intake/Output from previous day: 08/26 0701 - 08/27 0700 In: 1722.9 [I.V.:1204.4; IV Piggyback:518.5] Out: 1050 [Urine:900] Intake/Output this shift: No intake/output data recorded.  PE: Gen:  Alert, NAD, pleasant Card:  Tachycardic with irregular rhythm Pulm: Normal rate and effort Abd: Soft, mild distension, generalized tenderness greater on the right, midline wound covered with eakin's pouch and suction draining stool into pouch and cannister. +BS.  Psych: A&Ox3  Skin: no rashes noted, warm and dry  Lab Results:  Recent Labs    07/20/21 0250 07/21/21 0611  WBC 12.8* 9.7  HGB 7.8* 7.7*  HCT 27.1* 26.6*  PLT 270 238   BMET Recent Labs    07/20/21 0250 07/21/21 0611  NA 132* 134*  K 3.1* 3.9  CL 102 105  CO2 23 25  GLUCOSE 112* 110*  BUN 15 14  CREATININE 1.07* 0.85  CALCIUM 7.4* 7.5*   PT/INR Recent Labs    07/18/21 0830  LABPROT 30.6*  INR 2.9*   CMP     Component Value Date/Time   NA 134 (L) 07/21/2021 0611   K 3.9 07/21/2021 0611   CL 105 07/21/2021 0611   CO2 25 07/21/2021 0611   GLUCOSE 110 (H) 07/21/2021 0611   BUN 14 07/21/2021 0611   CREATININE 0.85 07/21/2021 0611   CALCIUM 7.5 (L) 07/21/2021 0611   PROT 4.5 (L) 07/20/2021 0250   ALBUMIN 1.4 (L) 07/20/2021 0250   AST 17 07/20/2021 0250   ALT 15 07/20/2021 0250   ALKPHOS 100 07/20/2021 0250   BILITOT 0.7 07/20/2021 0250   GFRNONAA >60 07/21/2021 0611   GFRAA >90 03/28/2014 0435   Lipase      Component Value Date/Time   LIPASE 25 03/27/2014 0600    Studies/Results: DG CHEST PORT 1 VIEW  Result Date: 07/19/2021 CLINICAL DATA:  PICC placement. EXAM: PORTABLE CHEST 1 VIEW COMPARISON:  Radiograph earlier today. FINDINGS: Patient is rotated. The previous right upper extremity PICC tip is in the region of the distal brachiocephalic vein/SVC confluence. There is a new left upper extremity PICC with tip overlying the region of the mid SVC. No pneumothorax unchanged cardiomegaly. Irregular left basilar opacities are similar. There is increasing streaky right basilar atelectasis. No visualized pneumothorax. Soft tissue emphysema noted in the right axilla, also seen on abdominopelvic CT 07/18/2021. IMPRESSION: 1. Tip of the new left upper extremity PICC in the mid SVC. Previous right upper extremity PICC tip in the region of the brachiocephalic SVC confluence. 2. Unchanged cardiomegaly and irregular left basilar opacities. Increasing streaky right basilar atelectasis. 3. Soft tissue emphysema in the right axilla, also seen on abdominopelvic CT 07/18/2021. Electronically Signed   By: Keith Rake M.D.   On: 07/19/2021 18:37    Anti-infectives: Anti-infectives (From admission, onward)    Start     Dose/Rate Route Frequency Ordered Stop   07/20/21 1000  remdesivir 100 mg in sodium chloride 0.9 % 100 mL IVPB  See Hyperspace for full Linked Orders Report.   100 mg 200 mL/hr over 30 Minutes Intravenous Daily 07/19/21 1040 07/22/21 0959   07/20/21 0200  vancomycin (VANCOREADY) IVPB 750 mg/150 mL  Status:  Discontinued        750 mg 150 mL/hr over 60 Minutes Intravenous Every 24 hours 07/19/21 1036 07/19/21 1449   07/19/21 2300  anidulafungin (ERAXIS) 100 mg in sodium chloride 0.9 % 100 mL IVPB  Status:  Discontinued        100 mg 78 mL/hr over 100 Minutes Intravenous Every 24 hours 07/18/21 2107 07/19/21 1449   07/19/21 1600  vancomycin (VANCOREADY) IVPB 750 mg/150 mL  Status:   Discontinued        750 mg 150 mL/hr over 60 Minutes Intravenous Every 24 hours 07/19/21 0919 07/19/21 1016   07/19/21 1600  vancomycin (VANCOREADY) IVPB 750 mg/150 mL  Status:  Discontinued        750 mg 150 mL/hr over 60 Minutes Intravenous Every 24 hours 07/19/21 1016 07/19/21 1036   07/19/21 1400  piperacillin-tazobactam (ZOSYN) IVPB 3.375 g        3.375 g 12.5 mL/hr over 240 Minutes Intravenous Every 8 hours 07/19/21 1016     07/19/21 1130  remdesivir 200 mg in sodium chloride 0.9% 250 mL IVPB       See Hyperspace for full Linked Orders Report.   200 mg 580 mL/hr over 30 Minutes Intravenous Once 07/19/21 1040 07/19/21 1241   07/19/21 0100  metroNIDAZOLE (FLAGYL) IVPB 500 mg  Status:  Discontinued        500 mg 100 mL/hr over 60 Minutes Intravenous Every 12 hours 07/18/21 1203 07/18/21 1534   07/18/21 2200  ceFEPIme (MAXIPIME) 2 g in sodium chloride 0.9 % 100 mL IVPB  Status:  Discontinued        2 g 200 mL/hr over 30 Minutes Intravenous Every 12 hours 07/18/21 1156 07/18/21 1203   07/18/21 2200  meropenem (MERREM) 2 g in sodium chloride 0.9 % 100 mL IVPB  Status:  Discontinued        2 g 200 mL/hr over 30 Minutes Intravenous Every 12 hours 07/18/21 2104 07/19/21 1011   07/18/21 2200  anidulafungin (ERAXIS) 200 mg in sodium chloride 0.9 % 200 mL IVPB        200 mg 78 mL/hr over 200 Minutes Intravenous  Once 07/18/21 2108 07/19/21 0419   07/18/21 1800  clindamycin (CLEOCIN) IVPB 900 mg  Status:  Discontinued        900 mg 100 mL/hr over 30 Minutes Intravenous Every 8 hours 07/18/21 1534 07/18/21 2103   07/18/21 1600  vancomycin (VANCOREADY) IVPB 2000 mg/400 mL  Status:  Discontinued        2,000 mg 200 mL/hr over 120 Minutes Intravenous Every 24 hours 07/18/21 1544 07/19/21 0919   07/18/21 1545  piperacillin-tazobactam (ZOSYN) IVPB 3.375 g  Status:  Discontinued        3.375 g 12.5 mL/hr over 240 Minutes Intravenous Every 8 hours 07/18/21 1544 07/18/21 2104   07/18/21 1300   cefTRIAXone (ROCEPHIN) 2 g in sodium chloride 0.9 % 100 mL IVPB  Status:  Discontinued        2 g 200 mL/hr over 30 Minutes Intravenous Every 24 hours 07/18/21 1203 07/18/21 1628   07/18/21 1200  ceFEPIme (MAXIPIME) 2 g in sodium chloride 0.9 % 100 mL IVPB  Status:  Discontinued        2 g 200 mL/hr over 30 Minutes  Intravenous Every 12 hours 07/18/21 1155 07/18/21 1156   07/18/21 0930  ceFEPIme (MAXIPIME) 2 g in sodium chloride 0.9 % 100 mL IVPB        2 g 200 mL/hr over 30 Minutes Intravenous  Once 07/18/21 0918 07/18/21 1135   07/18/21 0930  metroNIDAZOLE (FLAGYL) IVPB 500 mg  Status:  Discontinued        500 mg 100 mL/hr over 60 Minutes Intravenous  Once 07/18/21 [id number] 07/18/21 1534        Assessment/Plan 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 Arnoldo Morale at [place] on 8/12 - path w/ adenocarcinoma  - Midline wound opened by Dr. Arnoldo Morale at bedside 8/24 and now has stool decompressing through CC fistula  - Currently has eakin's pouch with suction to aid in collection of output. WOCN following - Keep NPO. TPN and monitor strict I/O (please document stool output so can track if this is low or high output fistula) - Cont abx. Afebrile and wbc normalized.  - Our team will see again Monday   FEN - NPO. TPN. IVF per primary VTE - SCDs, on heparin gtt ID - On Zosyn. Afebrile. WBC 26.4 > 12.8 > 9.7. Afebrile   COVID + Sepsis - off pressors, improved. No on [place] service.  A. Fib on Xarelto at home - on heparin gtt  ABL anemia - FOBT positive 8/24. Monitor. Hgb 9.8 > 8.5 > 7.8>7.7 and stable. Hypothyroidism AKI - resolved Severe protein calorie malnutrition - albumin 1.4. Pre-alb <5. On TNA   LOS: 3 days    Lauren [name] , [facility] 07/21/2021, 8:23 AM Please see Amion for pager number during day hours 7:00am-4:30pm

## 2021-07-21 NOTE — Progress Notes (Signed)
[place] for IV Heparin  Indication: atrial fibrillation  Allergies  Allergen Reactions   Codeine Itching   Compazine [Prochlorperazine Edisylate]    Prednisone Nausea And Vomiting   Tetanus Toxoids Other (See Comments)    Knot like "hen egg" came up    Vicodin [Hydrocodone-Acetaminophen] Itching    Patient Measurements: Height: '5\' 2"'$  (157.5 cm) Weight: 121 kg (266 lb 12.1 oz) IBW/kg (Calculated) : 50.1 Heparin Dosing Weight: 78 kg  Vital Signs:    Labs: Recent Labs    07/19/21 0330 07/20/21 0250 07/20/21 1208 07/20/21 2140 07/21/21 0611 07/21/21 1738  HGB 8.5* 7.8*  --   --  7.7*  --   HCT 28.6* 27.1*  --   --  26.6*  --   PLT 304 270  --   --  238  --   APTT  --  59*  --   --   --   --   HEPARINUNFRC  --  0.11*   < > <0.10* 0.15* 0.11*  CREATININE 1.36* 1.07*  --   --  0.85  --    < > = values in this interval not displayed.    Estimated Creatinine Clearance: 74.1 mL/min (by C-G formula based on SCr of 0.85 mg/dL).  Assessment: 75 y.o. female with recent hospitalization from 07/03/21-07/17/21 with anemia from GI bleeding secondary to cecal mass, S/P R hemicolectomy on 07/06/21. Pt has history of Afib on Xarelto PTA (on hold because patient is NPO to allow bowel rest).   Heparin level remains low at 0.11 tonight on 1450 units/h.  Goal of Therapy:  Heparin level 0.3-0.5 units/mL Monitor platelets by anticoagulation protocol: Yes   Plan: Increase heparin to 1650 units/h Repeat heparin level in 8h  Arrie Senate, PharmD, [place], [place] Pharmacist [phone] Please check AMION for all [place] numbers 07/21/2021

## 2021-07-21 NOTE — Progress Notes (Signed)
PHARMACY - TOTAL PARENTERAL NUTRITION CONSULT NOTE   Indication: Fistula  Patient Measurements: Height: '5\' 2"'$  (157.5 cm) Weight: 121 kg (266 lb 12.1 oz) IBW/kg (Calculated) : 50.1 TPN AdjBW (KG): 66.6 Body mass index is 48.79 kg/m. Usual Weight:   Assessment: Lauren [name] with recent R-hemicolectomy on 8/12 in the setting of a cecal mass - consistent with R-colon metastatic adenocarcinoma. 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 Pharmacy consulted to start TPN for nutritional support in the setting of a colocutaneous fistula.    Glucose / Insulin: CBGs <120, no SSI utilized Electrolytes: Na 134, K 3.9 (Received IV 60 mEq), Phos 2.2, Mg 1.8, CoCa 9.58 Renal:  SCr 0.85, BUN wnl Hepatic: LFTs/Tibili wnl, TG 73 Intake / Output; MIVF:  UOP 0.3L (incompletely documented, no foley), 168m ostomy (missing 2nd shift documentation) GI Imaging: 8/24 Abd CT: colocutaneous fistula, 7.4 cm abdominal wall abscess, soft tissue edema, cellulitis vs nec fasciitis 8/25 Abd xray: diffuse abdominal wall emphysema, Nonobstructive bowel gas pattern GI Surgeries / Procedures:  8/12 (last admit): R-hemicolectomy  Central access: PICC replaced 8/25 TPN start date: 8/25 >>  Nutritional Goals: Goal TPN rate is 105 mL/hr (provides 151 g of protein, 312g dextrose, 70g lipids, and 2373 kcals per day)  RD Assessment on 8/25: Goal 2300-2500 kcal, 140-160g protein per day, fluid >/= 2L  Current Nutrition:  NPO  Plan:  Increase TPN to goal 105 mL/hr at 1800 - will provide 151 g protein and 2373 kcal meeting 100% of estimated needs Electrolytes in TPN: Increase Na to 125 mEq/L, K 473m/L, Mg 6 mEq/L, and Phos 1460m/L; Continue Ca 5mE37m, Cl:Ac 1:1 Give Mag IV 2g x1 Give Kphos 20 mmol x1 (provides 35 mEq K)  Add standard MVI and trace elements to TPN Decrease to Q8hr sSSI and adjust as needed  Monitor standard TPN labs daily until stable at goal then  on Mon/Thurs   Thank you for allowing pharmacy to be a part of this patient's care.  LydiBenetta SpararmD, BCPS, BCCP Clinical Pharmacist  Please check AMION for all [facility] [place] numbers After 10:00 PM, call [place]-[phone]

## 2021-07-22 DIAGNOSIS — C189 Malignant neoplasm of colon, unspecified: Secondary | ICD-10-CM | POA: Diagnosis not present

## 2021-07-22 DIAGNOSIS — A419 Sepsis, unspecified organism: Secondary | ICD-10-CM | POA: Diagnosis not present

## 2021-07-22 DIAGNOSIS — N179 Acute kidney failure, unspecified: Secondary | ICD-10-CM | POA: Diagnosis not present

## 2021-07-22 DIAGNOSIS — L03311 Cellulitis of abdominal wall: Secondary | ICD-10-CM | POA: Diagnosis not present

## 2021-07-22 LAB — CBC
HCT: 25 % — ABNORMAL LOW (ref 36.0–46.0)
Hemoglobin: 7.3 g/dL — ABNORMAL LOW (ref 12.0–15.0)
MCH: 22.2 pg — ABNORMAL LOW (ref 26.0–34.0)
MCHC: 29.2 g/dL — ABNORMAL LOW (ref 30.0–36.0)
MCV: 76 fL — ABNORMAL LOW (ref 80.0–100.0)
Platelets: 219 10*3/uL (ref 150–400)
RBC: 3.29 MIL/uL — ABNORMAL LOW (ref 3.87–5.11)
RDW: 28.4 % — ABNORMAL HIGH (ref 11.5–15.5)
WBC: 9.8 10*3/uL (ref 4.0–10.5)
nRBC: 0 % (ref 0.0–0.2)

## 2021-07-22 LAB — GLUCOSE, CAPILLARY
Glucose-Capillary: 109 mg/dL — ABNORMAL HIGH (ref 70–99)
Glucose-Capillary: 117 mg/dL — ABNORMAL HIGH (ref 70–99)

## 2021-07-22 LAB — BASIC METABOLIC PANEL
Anion gap: 6 (ref 5–15)
BUN: 13 mg/dL (ref 8–23)
CO2: 26 mmol/L (ref 22–32)
Calcium: 7.6 mg/dL — ABNORMAL LOW (ref 8.9–10.3)
Chloride: 104 mmol/L (ref 98–111)
Creatinine, Ser: 0.66 mg/dL (ref 0.44–1.00)
GFR, Estimated: >60 mL/min (ref >60)
Glucose, Bld: 111 mg/dL — ABNORMAL HIGH (ref 70–99)
Potassium: 4.3 mmol/L (ref 3.5–5.1)
Sodium: 136 mmol/L (ref 135–145)

## 2021-07-22 LAB — PHOSPHORUS: Phosphorus: 2.2 mg/dL — ABNORMAL LOW (ref 2.5–4.6)

## 2021-07-22 LAB — MAGNESIUM: Magnesium: 1.8 mg/dL (ref 1.7–2.4)

## 2021-07-22 LAB — HEPARIN LEVEL (UNFRACTIONATED)
Heparin Unfractionated: 0.18 IU/mL — ABNORMAL LOW (ref 0.30–0.70)
Heparin Unfractionated: 0.19 IU/mL — ABNORMAL LOW (ref 0.30–0.70)

## 2021-07-22 MED ORDER — TRAVASOL 10 % IV SOLN
INTRAVENOUS | Status: AC
  Filled 2021-07-22: qty 1512

## 2021-07-22 MED ORDER — SODIUM PHOSPHATES 45 MMOLE/15ML IV SOLN
30.0000 mmol | Freq: Once | INTRAVENOUS | Status: AC
  Administered 2021-07-22: 30 mmol via INTRAVENOUS
  Filled 2021-07-22: qty 10

## 2021-07-22 MED ORDER — MAGNESIUM SULFATE 4 GM/100ML IV SOLN
4.0000 g | Freq: Once | INTRAVENOUS | Status: AC
  Administered 2021-07-22: 4 g via INTRAVENOUS
  Filled 2021-07-22: qty 100

## 2021-07-22 NOTE — Progress Notes (Signed)
Pharmacy Antibiotic Note - follow-up  Lauren [name] is a 75 y.o. female admitted on 07/18/2021 with cellulitis. Of note during this admission, started on TPN  on 07/19/21 with npo and bowel rest.  Pharmacy consulted to dose zosyn. Recently step-down from ICU, pt is improving, WBC WNL, and afebrile.   Plan: Continue zosyn 3.375g q8h F/u for duration    Height: '5\' 2"'$  (157.5 cm) Weight: 122.9 kg (270 lb 15.1 oz) IBW/kg (Calculated) : 50.1  Temp (24hrs), Avg:98.4 F (36.9 C), Min:97.9 F (36.6 C), Max:98.9 F (37.2 C)  Recent Labs  Lab 07/18/21 0830 07/18/21 0835 07/18/21 1022 07/19/21 0330 07/20/21 0250 07/21/21 0611 07/22/21 0323  WBC 33.1*  --   --  26.4* 12.8* 9.7 9.8  CREATININE 1.58*  --   --  1.36* 1.07* 0.85 0.66  LATICACIDVEN  --  4.1* 2.3*  --   --   --   --      Estimated Creatinine Clearance: 79.5 mL/min (by C-G formula based on SCr of 0.66 mg/dL).    Allergies  Allergen Reactions   Codeine Itching   Compazine [Prochlorperazine Edisylate]    Prednisone Nausea And Vomiting   Tetanus Toxoids Other (See Comments)    Knot like "hen egg" came up    Vicodin [Hydrocodone-Acetaminophen] Itching    Antimicrobials this admission: Vancomycin 8/24 >>8/25 Anidul 8/24>> 8/25 zosyn 8/24 >> 8/25, restarted 8/25>> cefempime 8/24 >> 8/24 Meropenem 8/25>>8/25  Microbiology results: 8/24 BCx: NGTD 8/24 MRSA PCR: negative  Thank you for allowing pharmacy to participate in this patient's care.  Levonne Spiller, PharmD PGY1 Acute Care Resident  07/22/2021,3:40 PM

## 2021-07-22 NOTE — Progress Notes (Signed)
PROGRESS NOTE  Lauren [name] [id number] DOB: [birth year] DOA: 07/18/2021 PCP: Pcp, No   LOS: 4 days   Brief narrative:  Patient is 75 years old female with past medical history of atrial fibrillation, combined heart failure, hypertension, lymphedema, recent admission to the hospital for GI bleed secondary to cecal mass status post right hemicolectomy on 07/06/2021 at outside hospital.  Pathology was consistent with metastatic adenocarcinoma was at [facility] skilled nursing facility when she was noted to be hypotensive and complained of nausea, abdominal pain and back pain.  She was also found to be COVID positive.  Patient was then brought in hospital and received IV fluids vasopressors antibiotic.  CT scan of the abdomen showed the fistula extending from the bowel adjacent to the suture through an umbilical hernia into the abdominal wall with fluid collection in the anterior abdominal wall and gas tracking.  Patient was admitted to the ICU on 07/18/2021 and was started on vasopressors and antibiotic.  On 07/19/21 patient was started on TPN and was taken off of Levophed.  Subsequently, patient was considered stable for transfer out of ICU.Marland Kitchen  Assessment/Plan:  Principal Problem:   Septic shock (HCC) Active Problems:   Hypokalemia   Morbid obesity with BMI of 50.0-59.9, adult (HCC)   Colon carcinoma ([place])   Enterocutaneous fistula   Cellulitis   AKI (acute kidney injury) ([place])   Hyponatremia  Septic shock with abdominal wall fluid collection- improved.  Was on vasopressors which has been weaned off.  Continue IV Zosyn.  Blood cultures negative in 4 days.  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  T3 N2 M0 status.  CT scan showing some fistula with abdominal wall collection.  Surgery on board.  Continue TPN, n.p.o. bowel rest and antibiotics.  Currently on Zosyn, at least for 7 days currently, day  4/7.  Management as per surgery.  Incidental COVID 19 positive. Completed remdesivir.  On room air.   Chronic combined CHF, history of atrial fibrillation  Patient was on Xarelto as outpatient.  On metoprolol IV every 6hourly.  Will on heparin drip.  Could transition to Lovenox if no surgical intervention planned.  AKI Improved.  Latest creatinine of 0.6.   Anemia, microcytic No signs of active bleeding.  We will continue to monitor.  Latest hemoglobin of 7.3.   Hyponatremia,  Improved.  Hypokalemia Improved after replacement.  Latest potassium of 4.3  Hypomagnesia Resolved, Latest magnesium of 1.8, on TPN.  Continue to monitor electrolytes  DVT prophylaxis: Place and maintain sequential compression device Start: 07/18/21 1202 Place TED hose Start: 07/18/21 1202   Code Status: Full code  Family Communication:  I spoke with the patient's brother and updated him about the clinical condition of the patient.    Status is: Inpatient  Remains inpatient appropriate because:Unsafe d/c plan, IV treatments appropriate due to intensity of illness or inability to take PO, and Inpatient level of care appropriate due to severity of illness  Dispo: The patient is from: SNF              Anticipated d/c is to: SNF              Patient currently is not medically stable to d/c.   Difficult to place patient No   Consultants: General surgery PCCM  Procedures: PICC line placement  Anti-infectives:  Remdesivir- completed Zosyn IV   Subjective: Today, patient was seen and examined at bedside.  Complains of vague symptoms including not being too comfortable.  Denies any nausea vomiting.  Wishes to eat and wants to go home.  Objective: Vitals:   07/22/21 0526 07/22/21 0900  BP: (!) 87/54 (!) 93/53  Pulse: 70   Resp: 16 18  Temp:    SpO2: 97% 99%    Intake/Output Summary (Last 24 hours) at 07/22/2021 1146 Last data filed at 07/22/2021 0525 Gross per 24 hour  Intake 961.62 ml   Output 750 ml  Net 211.62 ml    Filed Weights   07/18/21 2048 07/19/21 1333 07/22/21 0413  Weight: 116.2 kg 121 kg 122.9 kg   Body mass index is 49.56 kg/m.   Physical Exam: GENERAL: Patient is alert awake and oriented. Not in obvious distress.  Morbidly obese HENT: No scleral pallor or icterus. Pupils equally reactive to light. Oral mucosa is dry NECK: is supple, no gross swelling noted. CHEST: Clear to auscultation. No crackles or wheezes.  Diminished breath sounds bilaterally. CVS: S1 and S2 heard, no murmur. Regular rate and rhythm.  ABDOMEN: Soft, non-tender,  Midline ostomy with feculent output.  Hypoactive bowel sounds EXTREMITIES: No edema.  Left upper extremity PICC line in place CNS: Cranial nerves are intact. No focal motor deficits. SKIN: warm and dry without rashes.  Data Review: I have personally reviewed the following laboratory data and studies,  CBC: Recent Labs  Lab 07/18/21 0830 07/19/21 0330 07/20/21 0250 07/21/21 0611 07/22/21 0323  WBC 33.1* 26.4* 12.8* 9.7 9.8  NEUTROABS 28.8*  --   --   --   --   HGB 9.8* 8.5* 7.8* 7.7* 7.3*  HCT 34.2* 28.6* 27.1* 26.6* 25.0*  MCV 77.9* 74.9* 75.5* 76.0* 76.0*  PLT 318 304 270 238 [id number]    Basic Metabolic Panel: Recent Labs  Lab 07/18/21 0830 07/19/21 0330 07/19/21 2120 07/20/21 0250 07/21/21 0611 07/22/21 0323  NA 129* 131*  --  132* 134* 136  K 2.8* 3.2*  --  3.1* 3.9 4.3  CL 94* 99  --  102 105 104  CO2 24 22  --  '23 25 26  '$ GLUCOSE 83 68*  --  112* 110* 111*  BUN 17 17  --  '15 14 13  '$ CREATININE 1.58* 1.36*  --  1.07* 0.85 0.66  CALCIUM 7.2* 6.9*  --  7.4* 7.5* 7.6*  MG  --  1.3* 2.3 2.2 1.8 1.8  PHOS  --  3.6  --  3.1 2.2* 2.2*    Liver Function Tests: Recent Labs  Lab 07/18/21 0830 07/19/21 0330 07/20/21 0250  AST '28 25 17  '$ ALT '17 17 15  '$ ALKPHOS 102 76 100  BILITOT 1.7* 1.1 0.7  PROT 5.4* 4.5* 4.5*  ALBUMIN 2.0* 1.5* 1.4*    No results for input(s): LIPASE, AMYLASE in the last 168  hours. No results for input(s): AMMONIA in the last 168 hours. Cardiac Enzymes: No results for input(s): CKTOTAL, CKMB, CKMBINDEX, TROPONINI in the last 168 hours. BNP (last 3 results) No results for input(s): BNP in the last 8760 hours.  ProBNP (last 3 results) No results for input(s): PROBNP in the last 8760 hours.  CBG: Recent Labs  Lab 07/20/21 2346 07/21/21 0403 07/21/21 1609 07/22/21 0021 07/22/21 0345  GLUCAP 103* 103* 109* 117* 109*    Recent Results (from the past 240 hour(s))  C Difficile Quick Screen (NO PCR Reflex)     Status: None   Collection Time: 07/16/21  1:09 AM   Specimen: STOOL  Result  Value Ref Range Status   C Diff antigen NEGATIVE NEGATIVE Final   C Diff toxin NEGATIVE NEGATIVE Final   C Diff interpretation No C. difficile detected.  Final    Comment: Performed at [facility], [address] [place], [place] [address]  Blood culture (routine single)     Status: None (Preliminary result)   Collection Time: 07/18/21  8:35 AM   Specimen: Right Antecubital; Blood  Result Value Ref Range Status   Specimen Description RIGHT ANTECUBITAL  Final   Special Requests   Final    BOTTLES DRAWN AEROBIC AND ANAEROBIC Blood Culture adequate volume   Culture   Final    NO GROWTH 4 DAYS Performed at [facility], [address] [place], [place] [address]    Report Status PENDING  Incomplete  Resp Panel by RT-PCR (Flu A&B, Covid) Nasopharyngeal Swab     Status: Abnormal   Collection Time: 07/18/21  9:18 AM   Specimen: Nasopharyngeal Swab; Nasopharyngeal(NP) swabs in vial transport medium  Result Value Ref Range Status   SARS Coronavirus 2 by RT PCR POSITIVE (A) NEGATIVE Final    Comment: RESULT CALLED TO, READ BACK BY AND VERIFIED WITH: OAKLY,B AT 1425 BY HUFFINES,S ON 07/18/21. (NOTE) SARS-CoV-2 target nucleic acids are DETECTED.  The SARS-CoV-2 RNA is generally detectable in upper respiratory specimens during the acute phase of infection. Positive results  are indicative of the presence of the identified virus, but do not rule out bacterial infection or co-infection with other pathogens not detected by the test. Clinical correlation with patient history and other diagnostic information is necessary to determine patient infection status. The expected result is Negative.  Fact Sheet for Patients: EntrepreneurPulse.com.au  Fact Sheet for Healthcare Providers: IncredibleEmployment.be  This test is not yet approved or cleared by the [place] FDA and  has been authorized for detection and/or diagnosis of SARS-CoV-2 by FDA under an Emergency Use Authorization (EUA).  This EUA will remain in effect (meaning this te st can be used) for the duration of  the COVID-19 declaration under Section 564(b)(1) of the Act, 21 U.S.C. section 360bbb-3(b)(1), unless the authorization is terminated or revoked sooner.     Influenza A by PCR NEGATIVE NEGATIVE Final   Influenza B by PCR NEGATIVE NEGATIVE Final    Comment: (NOTE) The Xpert Xpress SARS-CoV-2/FLU/RSV plus assay is intended as an aid in the diagnosis of influenza from Nasopharyngeal swab specimens and should not be used as a sole basis for treatment. Nasal washings and aspirates are unacceptable for Xpert Xpress SARS-CoV-2/FLU/RSV testing.  Fact Sheet for Patients: EntrepreneurPulse.com.au  Fact Sheet for Healthcare Providers: IncredibleEmployment.be  This test is not yet approved or cleared by the [place] FDA and has been authorized for detection and/or diagnosis of SARS-CoV-2 by FDA under an Emergency Use Authorization (EUA). This EUA will remain in effect (meaning this test can be used) for the duration of the COVID-19 declaration under Section 564(b)(1) of the Act, 21 U.S.C. section 360bbb-3(b)(1), unless the authorization is terminated or revoked.  Performed at [facility], [address],  [place],  [address]   Blood Culture (routine x 2)     Status: None (Preliminary result)   Collection Time: 07/18/21 10:22 AM   Specimen: BLOOD  Result Value Ref Range Status   Specimen Description BLOOD RIGHT ANTECUBITAL  Final   Special Requests   Final    BOTTLES DRAWN AEROBIC AND ANAEROBIC Blood Culture adequate volume   Culture   Final  NO GROWTH 4 DAYS Performed at [facility], [address] [place], [place] [address]    Report Status PENDING  Incomplete  MRSA Next Gen by PCR, Nasal     Status: None   Collection Time: 07/18/21  8:51 PM   Specimen: Nasal Mucosa; Nasal Swab  Result Value Ref Range Status   MRSA by PCR Next Gen NOT DETECTED NOT DETECTED Final    Comment: (NOTE) The GeneXpert MRSA Assay (FDA approved for NASAL specimens only), is one component of a comprehensive MRSA colonization surveillance program. It is not intended to diagnose MRSA infection nor to guide or monitor treatment for MRSA infections. Test performance is not FDA approved in patients less than 75 years old. Performed at [facility], [place] [address] [place], [place] [address]       Studies: No results found.    Flora Lipps, MD  [facility] 07/22/2021  If 7PM-7AM, please contact night-coverage

## 2021-07-22 NOTE — Progress Notes (Signed)
Subjective: CC: Stable abdominal pain. Asked RN to record output from fistula. Unclear how much out in the last 24 hours. Patient reports she wants to eat. BP soft but no tachycardia and mentation/conversing appropriately. WBC wnl   Objective: Vital signs in last 24 hours: Temp:  [97.9 F (36.6 C)-98.9 F (37.2 C)] 98.9 F (37.2 C) (08/28 0400) Pulse Rate:  [64-91] 70 (08/28 0526) Resp:  [16-24] 18 (08/28 0900) BP: (87-102)/(42-54) 93/53 (08/28 0900) SpO2:  [97 %-99 %] 99 % (08/28 0900) Weight:  [122.9 kg] 122.9 kg (08/28 0413) Last BM Date: 07/21/21  Intake/Output from previous day: 08/27 0701 - 08/28 0700 In: 961.6 [I.V.:961.6] Out: 750 [Urine:750] Intake/Output this shift: No intake/output data recorded.  PE: Gen:  Alert, NAD, pleasant Card:  Reg rate Pulm: Normal rate and effort Abd: Soft, mild distension, generalized tenderness greater on the right, midline wound covered with eakin's pouch and suction with stool inpouch and cannister. Msk: MAE's Skin: no rashes noted, warm and dry  Lab Results:  Recent Labs    07/21/21 0611 07/22/21 0323  WBC 9.7 9.8  HGB 7.7* 7.3*  HCT 26.6* 25.0*  PLT 238 219   BMET Recent Labs    07/21/21 0611 07/22/21 0323  NA 134* 136  K 3.9 4.3  CL 105 104  CO2 25 26  GLUCOSE 110* 111*  BUN 14 13  CREATININE 0.85 0.66  CALCIUM 7.5* 7.6*   PT/INR No results for input(s): LABPROT, INR in the last 72 hours. CMP     Component Value Date/Time   NA 136 07/22/2021 0323   K 4.3 07/22/2021 0323   CL 104 07/22/2021 0323   CO2 26 07/22/2021 0323   GLUCOSE 111 (H) 07/22/2021 0323   BUN 13 07/22/2021 0323   CREATININE 0.66 07/22/2021 0323   CALCIUM 7.6 (L) 07/22/2021 0323   PROT 4.5 (L) 07/20/2021 0250   ALBUMIN 1.4 (L) 07/20/2021 0250   AST 17 07/20/2021 0250   ALT 15 07/20/2021 0250   ALKPHOS 100 07/20/2021 0250   BILITOT 0.7 07/20/2021 0250   GFRNONAA >60 07/22/2021 0323   GFRAA >90 03/28/2014 0435   Lipase      Component Value Date/Time   LIPASE 25 03/27/2014 0600    Studies/Results: No results found.  Anti-infectives: Anti-infectives (From admission, onward)    Start     Dose/Rate Route Frequency Ordered Stop   07/20/21 1000  remdesivir 100 mg in sodium chloride 0.9 % 100 mL IVPB       See Hyperspace for full Linked Orders Report.   100 mg 200 mL/hr over 30 Minutes Intravenous Daily 07/19/21 1040 07/21/21 1148   07/20/21 0200  vancomycin (VANCOREADY) IVPB 750 mg/150 mL  Status:  Discontinued        750 mg 150 mL/hr over 60 Minutes Intravenous Every 24 hours 07/19/21 1036 07/19/21 1449   07/19/21 2300  anidulafungin (ERAXIS) 100 mg in sodium chloride 0.9 % 100 mL IVPB  Status:  Discontinued        100 mg 78 mL/hr over 100 Minutes Intravenous Every 24 hours 07/18/21 2107 07/19/21 1449   07/19/21 1600  vancomycin (VANCOREADY) IVPB 750 mg/150 mL  Status:  Discontinued        750 mg 150 mL/hr over 60 Minutes Intravenous Every 24 hours 07/19/21 0919 07/19/21 1016   07/19/21 1600  vancomycin (VANCOREADY) IVPB 750 mg/150 mL  Status:  Discontinued        750 mg 150  mL/hr over 60 Minutes Intravenous Every 24 hours 07/19/21 1016 07/19/21 1036   07/19/21 1400  piperacillin-tazobactam (ZOSYN) IVPB 3.375 g        3.375 g 12.5 mL/hr over 240 Minutes Intravenous Every 8 hours 07/19/21 1016     07/19/21 1130  remdesivir 200 mg in sodium chloride 0.9% 250 mL IVPB       See Hyperspace for full Linked Orders Report.   200 mg 580 mL/hr over 30 Minutes Intravenous Once 07/19/21 1040 07/19/21 1241   07/19/21 0100  metroNIDAZOLE (FLAGYL) IVPB 500 mg  Status:  Discontinued        500 mg 100 mL/hr over 60 Minutes Intravenous Every 12 hours 07/18/21 1203 07/18/21 1534   07/18/21 2200  ceFEPIme (MAXIPIME) 2 g in sodium chloride 0.9 % 100 mL IVPB  Status:  Discontinued        2 g 200 mL/hr over 30 Minutes Intravenous Every 12 hours 07/18/21 1156 07/18/21 1203   07/18/21 2200  meropenem (MERREM) 2 g in sodium  chloride 0.9 % 100 mL IVPB  Status:  Discontinued        2 g 200 mL/hr over 30 Minutes Intravenous Every 12 hours 07/18/21 2104 07/19/21 1011   07/18/21 2200  anidulafungin (ERAXIS) 200 mg in sodium chloride 0.9 % 200 mL IVPB        200 mg 78 mL/hr over 200 Minutes Intravenous  Once 07/18/21 2108 07/19/21 0419   07/18/21 1800  clindamycin (CLEOCIN) IVPB 900 mg  Status:  Discontinued        900 mg 100 mL/hr over 30 Minutes Intravenous Every 8 hours 07/18/21 1534 07/18/21 2103   07/18/21 1600  vancomycin (VANCOREADY) IVPB 2000 mg/400 mL  Status:  Discontinued        2,000 mg 200 mL/hr over 120 Minutes Intravenous Every 24 hours 07/18/21 1544 07/19/21 0919   07/18/21 1545  piperacillin-tazobactam (ZOSYN) IVPB 3.375 g  Status:  Discontinued        3.375 g 12.5 mL/hr over 240 Minutes Intravenous Every 8 hours 07/18/21 1544 07/18/21 2104   07/18/21 1300  cefTRIAXone (ROCEPHIN) 2 g in sodium chloride 0.9 % 100 mL IVPB  Status:  Discontinued        2 g 200 mL/hr over 30 Minutes Intravenous Every 24 hours 07/18/21 1203 07/18/21 1628   07/18/21 1200  ceFEPIme (MAXIPIME) 2 g in sodium chloride 0.9 % 100 mL IVPB  Status:  Discontinued        2 g 200 mL/hr over 30 Minutes Intravenous Every 12 hours 07/18/21 1155 07/18/21 1156   07/18/21 0930  ceFEPIme (MAXIPIME) 2 g in sodium chloride 0.9 % 100 mL IVPB        2 g 200 mL/hr over 30 Minutes Intravenous  Once 07/18/21 0918 07/18/21 1135   07/18/21 0930  metroNIDAZOLE (FLAGYL) IVPB 500 mg  Status:  Discontinued        500 mg 100 mL/hr over 60 Minutes Intravenous  Once 07/18/21 [id number] 07/18/21 1534        Assessment/Plan 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 Arnoldo Morale at [facility] on 8/12 - path w/ adenocarcinoma  - Midline wound opened by Dr. Arnoldo Morale at bedside 8/24 and now has stool decompressing through CC fistula  - Currently has eakin's pouch with suction to aid in collection of output. WOCN following - Keep  NPO. TPN and monitor strict I/O (please document stool output so can track if this  is low or high output fistula) - Cont abx. Afebrile and wbc normalized.  - Our team will see again Monday or Tuesday   FEN - NPO. TPN. IVF per primary VTE - SCDs, on heparin gtt ID - On Zosyn. Afebrile. WBC 26.4 > 12.8 > 9.7 >9.8. Afebrile   COVID + Sepsis - off pressors, improved. No on [place] service.  A. Fib on Xarelto at home - on heparin gtt  ABL anemia - FOBT positive 8/24. Monitor. Hgb 9.8 > 8.5 > 7.8>7.7 > 7.3 Hypothyroidism AKI - resolved Severe protein calorie malnutrition - albumin 1.4. Pre-alb <5. On TNA   LOS: 4 days    Jillyn Ledger , [facility] 07/22/2021, 10:17 AM Please see Amion for pager number during day hours 7:00am-4:30pm

## 2021-07-22 NOTE — Progress Notes (Addendum)
[place] for IV Heparin  Indication: atrial fibrillation  Allergies  Allergen Reactions   Codeine Itching   Compazine [Prochlorperazine Edisylate]    Prednisone Nausea And Vomiting   Tetanus Toxoids Other (See Comments)    Knot like "hen egg" came up    Vicodin [Hydrocodone-Acetaminophen] Itching    Patient Measurements: Height: '5\' 2"'$  (157.5 cm) Weight: 122.9 kg (270 lb 15.1 oz) IBW/kg (Calculated) : 50.1 Heparin Dosing Weight: 78 kg  Vital Signs: Temp: 98.9 F (37.2 C) (08/28 0400) Temp Source: Oral (08/28 0400) BP: 93/53 (08/28 0900) Pulse Rate: 70 (08/28 0526)  Labs: Recent Labs    07/20/21 0250 07/20/21 1208 07/21/21 0611 07/21/21 1738 07/22/21 0323 07/22/21 0330 07/22/21 1402  HGB 7.8*  --  7.7*  --  7.3*  --   --   HCT 27.1*  --  26.6*  --  25.0*  --   --   PLT 270  --  238  --  219  --   --   APTT 59*  --   --   --   --   --   --   HEPARINUNFRC 0.11*   < > 0.15* 0.11*  --  0.18* 0.19*  CREATININE 1.07*  --  0.85  --  0.66  --   --    < > = values in this interval not displayed.    Estimated Creatinine Clearance: 79.5 mL/min (by C-G formula based on SCr of 0.66 mg/dL).  Assessment: 75 y.o. female with recent hospitalization from 07/03/21-07/17/21 with anemia from GI bleeding secondary to cecal mass, S/P R hemicolectomy on 07/06/21. Pt has history of Afib on Xarelto PTA (on hold because patient is NPO to allow bowel rest).   Current rate: Heparin IV 1850 unit/hr  8/28 Heparin Level 0.19, sub-therapeutic  No problems with infusion noted.   Goal of Therapy:  Heparin level 0.3-0.5 units/mL Monitor platelets by anticoagulation protocol: Yes   Plan: Increase heparin to 2000 units/h Repeat heparin level in 8h  Thank you for allowing pharmacy to participate in this patient's care.  Levonne Spiller, PharmD PGY1 Acute Care Resident  07/22/2021,3:25 PM

## 2021-07-22 NOTE — Progress Notes (Signed)
Pt is very verbally inappropriate . At 1330 she called her brother and told him she was being held against her will, she had an ostomy against her will, hasn't seem a doctor in a few days and we wont feed her.   Pt also spent the entire time speaking on white supremacy,how nurse wld not make it heaven bc she was colored. Nurse is very fortunate to be able to come work here since nurse has been allowed to come from a middle [place]. Pt continued with the the whole day trying to educate nurse on "american customs"  Nurse now uses buddy system when entering room to check or work with pt.

## 2021-07-22 NOTE — Progress Notes (Signed)
PHARMACY - TOTAL PARENTERAL NUTRITION CONSULT NOTE   Indication: Fistula  Patient Measurements: Height: '5\' 2"'$  (157.5 cm) Weight: 122.9 kg (270 lb 15.1 oz) IBW/kg (Calculated) : 50.1 TPN AdjBW (KG): 66.6 Body mass index is 49.56 kg/m. Usual Weight:   Assessment: 24 YOF with recent R-hemicolectomy on 8/12 in the setting of a cecal mass - consistent with R-colon metastatic adenocarcinoma. 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 Pharmacy consulted to start TPN for nutritional support in the setting of a colocutaneous fistula.    Glucose / Insulin: CBGs <120, no SSI utilized Electrolytes: K 4.3 (Received IV 35 mEq), Phos 2.2 (received 20 mmol), Mg 1.8 (received 2g), CoCa 9.68, CO2 wnl but trending up  Renal:  SCr 0.66, BUN wnl Hepatic: LFTs/Tibili wnl, TG 73 Intake / Output; MIVF:  UOP 0.5L (incompletely documented), no ostomy output documented GI Imaging: 8/24 Abd CT: colocutaneous fistula, 7.4 cm abdominal wall abscess, soft tissue edema, cellulitis vs nec fasciitis 8/25 Abd xray: diffuse abdominal wall emphysema, Nonobstructive bowel gas pattern GI Surgeries / Procedures:  8/12 (last admit): R-hemicolectomy  Central access: PICC replaced 8/25 TPN start date: 8/25 >>  Nutritional Goals: Goal TPN rate is 105 mL/hr (provides 151 g of protein, 312g dextrose, 70g lipids, and 2373 kcals per day)  RD Assessment on 8/25: Goal 2300-2500 kcal, 140-160g protein per day, fluid >/= 2L  Current Nutrition:  NPO  Plan:  Continue TPN to goal 105 mL/hr at 1800 - will provide 151 g protein and 2373 kcal meeting 100% of estimated needs Electrolytes in TPN: Increase Mg 9 mEq/L, Phos 18 mmol/L; Continue Na to 125 mEq/L, K 45 mEq/L, Ca 5 mEq/L, Cl:Ac 1:1 Give Mag IV 4g x1 Give Na Phos 30 mmol x1   Add standard MVI and trace elements to TPN Stop insulin Monitor standard TPN labs Mon/Thurs   Thank you for allowing pharmacy to be a  part of this patient's care.  Benetta Spar, PharmD, BCPS, BCCP Clinical Pharmacist  Please check AMION for all [place] phone numbers After 10:00 PM, call [place] [phone]

## 2021-07-22 NOTE — Progress Notes (Signed)
ANTICOAGULATION CONSULT NOTE Pharmacy Consult for Heparin  Indication: atrial fibrillation  Allergies  Allergen Reactions   Codeine Itching   Compazine [Prochlorperazine Edisylate]    Prednisone Nausea And Vomiting   Tetanus Toxoids Other (See Comments)    Knot like "hen egg" came up    Vicodin [Hydrocodone-Acetaminophen] Itching    Patient Measurements: Height: '5\' 2"'$  (157.5 cm) Weight: 121 kg (266 lb 12.1 oz) IBW/kg (Calculated) : 50.1 Heparin Dosing Weight: 78 kg  Vital Signs: Temp: 97.9 F (36.6 C) (08/27 2204) Temp Source: Oral (08/27 2204) BP: 102/49 (08/28 0019) Pulse Rate: 64 (08/28 0019)  Labs: Recent Labs    07/20/21 0250 07/20/21 1208 07/21/21 0611 07/21/21 1738 07/22/21 0323 07/22/21 0330  HGB 7.8*  --  7.7*  --  7.3*  --   HCT 27.1*  --  26.6*  --  25.0*  --   PLT 270  --  238  --  219  --   APTT 59*  --   --   --   --   --   HEPARINUNFRC 0.11*   < > 0.15* 0.11*  --  0.18*  CREATININE 1.07*  --  0.85  --   --   --    < > = values in this interval not displayed.    Estimated Creatinine Clearance: 74.1 mL/min (by C-G formula based on SCr of 0.85 mg/dL).  Assessment: 75 y.o. female with h/o Afib, Xarelto on hold, for heparin  Goal of Therapy:  Heparin level 0.3-0.5 units/mL Monitor platelets by anticoagulation protocol: Yes   Plan: Increase heparin to 1850 units/h Repeat heparin level in 8h  Phillis Knack, PharmD, BCPS

## 2021-07-23 DIAGNOSIS — C189 Malignant neoplasm of colon, unspecified: Secondary | ICD-10-CM | POA: Diagnosis not present

## 2021-07-23 DIAGNOSIS — L03311 Cellulitis of abdominal wall: Secondary | ICD-10-CM | POA: Diagnosis not present

## 2021-07-23 DIAGNOSIS — N179 Acute kidney failure, unspecified: Secondary | ICD-10-CM | POA: Diagnosis not present

## 2021-07-23 DIAGNOSIS — A419 Sepsis, unspecified organism: Secondary | ICD-10-CM | POA: Diagnosis not present

## 2021-07-23 LAB — CBC
HCT: 25.4 % — ABNORMAL LOW (ref 36.0–46.0)
Hemoglobin: 7.2 g/dL — ABNORMAL LOW (ref 12.0–15.0)
MCH: 22 pg — ABNORMAL LOW (ref 26.0–34.0)
MCHC: 28.3 g/dL — ABNORMAL LOW (ref 30.0–36.0)
MCV: 77.4 fL — ABNORMAL LOW (ref 80.0–100.0)
Platelets: 217 10*3/uL (ref 150–400)
RBC: 3.28 MIL/uL — ABNORMAL LOW (ref 3.87–5.11)
RDW: 28.8 % — ABNORMAL HIGH (ref 11.5–15.5)
WBC: 9.8 10*3/uL (ref 4.0–10.5)
nRBC: 0 % (ref 0.0–0.2)

## 2021-07-23 LAB — COMPREHENSIVE METABOLIC PANEL
ALT: 11 U/L (ref 0–44)
AST: 15 U/L (ref 15–41)
Albumin: 1.3 g/dL — ABNORMAL LOW (ref 3.5–5.0)
Alkaline Phosphatase: 89 U/L (ref 38–126)
Anion gap: 3 — ABNORMAL LOW (ref 5–15)
BUN: 18 mg/dL (ref 8–23)
CO2: 27 mmol/L (ref 22–32)
Calcium: 7.6 mg/dL — ABNORMAL LOW (ref 8.9–10.3)
Chloride: 108 mmol/L (ref 98–111)
Creatinine, Ser: 0.52 mg/dL (ref 0.44–1.00)
GFR, Estimated: >60 mL/min (ref >60)
Glucose, Bld: 97 mg/dL (ref 70–99)
Potassium: 4.6 mmol/L (ref 3.5–5.1)
Sodium: 138 mmol/L (ref 135–145)
Total Bilirubin: 0.3 mg/dL (ref 0.3–1.2)
Total Protein: 4.8 g/dL — ABNORMAL LOW (ref 6.5–8.1)

## 2021-07-23 LAB — CULTURE, BLOOD (SINGLE)
Culture: NO GROWTH
Special Requests: ADEQUATE

## 2021-07-23 LAB — CULTURE, BLOOD (ROUTINE X 2)
Culture: NO GROWTH
Special Requests: ADEQUATE

## 2021-07-23 LAB — MAGNESIUM: Magnesium: 2 mg/dL (ref 1.7–2.4)

## 2021-07-23 LAB — PHOSPHORUS: Phosphorus: 2.5 mg/dL (ref 2.5–4.6)

## 2021-07-23 LAB — HEPARIN LEVEL (UNFRACTIONATED)
Heparin Unfractionated: 0.32 IU/mL (ref 0.30–0.70)
Heparin Unfractionated: 0.39 IU/mL (ref 0.30–0.70)

## 2021-07-23 LAB — TRIGLYCERIDES: Triglycerides: 48 mg/dL (ref <150)

## 2021-07-23 MED ORDER — TRAVASOL 10 % IV SOLN
INTRAVENOUS | Status: AC
  Filled 2021-07-23: qty 1512

## 2021-07-23 NOTE — Progress Notes (Signed)
PROGRESS NOTE  Lauren [name] [id number] DOB: 10-09-1946 DOA: 07/18/2021 PCP: Pcp, No   LOS: 5 days   Brief narrative:  Patient is 75 years old female with past medical history of atrial fibrillation, combined heart failure, hypertension, lymphedema, recent admission to the hospital for GI bleed secondary to cecal mass status post right hemicolectomy on 07/06/2021 at outside hospital.  Pathology was consistent with metastatic adenocarcinoma was at [facility] skilled nursing facility when she was noted to be hypotensive and complained of nausea, abdominal pain and back pain.  She was also found to be COVID positive.  Patient was then brought in hospital and received IV fluids vasopressors antibiotic.  CT scan of the abdomen showed the fistula extending from the bowel adjacent to the suture through an umbilical hernia into the abdominal wall with fluid collection in the anterior abdominal wall and gas tracking.  Patient was admitted to the ICU on 07/18/2021 and was started on vasopressors and antibiotic.  On 07/19/21 patient was started on TPN and was taken off of Levophed.  Subsequently, patient was considered stable for transfer out of ICU.  Assessment/Plan:  Principal Problem:   Septic shock (HCC) Active Problems:   Hypokalemia   Morbid obesity with BMI of 50.0-59.9, adult (HCC)   Colon carcinoma ([place])   Enterocutaneous fistula   Cellulitis   AKI (acute kidney injury) ([place])   Hyponatremia  Septic shock with abdominal wall fluid collection- improved.  Was on vasopressors , currently off.  Continue IV Zosyn.  Blood cultures negative in 5 days.  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  T3 N2 M0 status.  CT scan showing some fistula with abdominal wall collection.  Surgery on board.  Continue TPN, n.p.o. bowel rest and antibiotics.  Currently on Zosyn, at least for 7 days currently, day 5/7.   Management as per surgery.  Incidental COVID 19 positive. Completed remdesivir.  On room air.   Chronic combined CHF, history of atrial fibrillation  Patient was on Xarelto as outpatient.  On metoprolol IV every 6hourly.  Will on heparin drip.  Could transition to Lovenox if no surgical intervention planned.  AKI Improved.  Latest creatinine of 0.5.   Anemia, microcytic No signs of active bleeding.  We will continue to monitor.  Latest hemoglobin of 7.2   Hyponatremia,  Improved.  Hypokalemia Improved after replacement.  Latest potassium of 4.3  Hypomagnesia Resolved, Latest magnesium of 2.0, on TPN.  Continue to monitor electrolytes  DVT prophylaxis: Place and maintain sequential compression device Start: 07/18/21 1202 Place TED hose Start: 07/18/21 1202   Code Status: Full code  Family Communication:  None today.  spoke with the patient's brother on 8/28  Status is: Inpatient  Remains inpatient appropriate because:Unsafe d/c plan, IV treatments appropriate due to intensity of illness or inability to take PO, and Inpatient level of care appropriate due to severity of illness  Dispo: The patient is from: SNF              Anticipated d/c is to: SNF              Patient currently is not medically stable to d/c.   Difficult to place patient No   Consultants: General surgery PCCM  Procedures: PICC line placement  Anti-infectives:  Remdesivir- completed Zosyn IV   Subjective: Today, patient was seen and examined at bedside.  Feels uncomfortable.  Denies any shortness of breath cough fever  chills.  Frustrated about not being able to eat and dry mouth  Objective: Vitals:   07/23/21 0500 07/23/21 0910  BP: 119/84 (!) 110/59  Pulse: 100 (!) 117  Resp:  18  Temp: 97.6 F (36.4 C) 98.2 F (36.8 C)  SpO2:  98%    Intake/Output Summary (Last 24 hours) at 07/23/2021 1037 Last data filed at 07/23/2021 0100 Gross per 24 hour  Intake 1992.24 ml  Output 200 ml   Net 1792.24 ml    Filed Weights   07/19/21 1333 07/22/21 0413 07/23/21 0500  Weight: 121 kg 122.9 kg 124.9 kg   Body mass index is 50.36 kg/m.   Physical Exam: General: Morbidly obese built, not in obvious distress HENT:   No scleral pallor or icterus noted. Oral mucosa is moist.  Chest:  Clear breath sounds.  Diminished breath sounds bilaterally. No crackles or wheezes.  CVS: S1 &S2 heard. No murmur.  Regular rate and rhythm. Abdomen: Soft, nontender, nondistended.  Bowel sounds are heard.  Midline ostomy, hypoactive bowel sounds. Extremities: No cyanosis, clubbing or edema.  Peripheral pulses are palpable.  Left upper extremity PICC line in place. Psych: Alert, awake and oriented, normal mood CNS:  No cranial nerve deficits.  Power equal in all extremities.   Skin: Warm and dry.  No rashes noted.  Data Review: I have personally reviewed the following laboratory data and studies,  CBC: Recent Labs  Lab 07/18/21 0830 07/19/21 0330 07/20/21 0250 07/21/21 0611 07/22/21 0323 07/23/21 0023  WBC 33.1* 26.4* 12.8* 9.7 9.8 9.8  NEUTROABS 28.8*  --   --   --   --   --   HGB 9.8* 8.5* 7.8* 7.7* 7.3* 7.2*  HCT 34.2* 28.6* 27.1* 26.6* 25.0* 25.4*  MCV 77.9* 74.9* 75.5* 76.0* 76.0* 77.4*  PLT 318 304 270 238 219 [id number]    Basic Metabolic Panel: Recent Labs  Lab 07/19/21 0330 07/19/21 2120 07/20/21 0250 07/21/21 0611 07/22/21 0323 07/23/21 0842  NA 131*  --  132* 134* 136 138  K 3.2*  --  3.1* 3.9 4.3 4.6  CL 99  --  102 105 104 108  CO2 22  --  '23 25 26 27  '$ GLUCOSE 68*  --  112* 110* 111* 97  BUN 17  --  '15 14 13 18  '$ CREATININE 1.36*  --  1.07* 0.85 0.66 0.52  CALCIUM 6.9*  --  7.4* 7.5* 7.6* 7.6*  MG 1.3* 2.3 2.2 1.8 1.8 2.0  PHOS 3.6  --  3.1 2.2* 2.2* 2.5    Liver Function Tests: Recent Labs  Lab 07/18/21 0830 07/19/21 0330 07/20/21 0250 07/23/21 0842  AST '28 25 17 15  '$ ALT '17 17 15 11  '$ ALKPHOS 102 76 100 89  BILITOT 1.7* 1.1 0.7 0.3  PROT 5.4* 4.5* 4.5*  4.8*  ALBUMIN 2.0* 1.5* 1.4* 1.3*    No results for input(s): LIPASE, AMYLASE in the last 168 hours. No results for input(s): AMMONIA in the last 168 hours. Cardiac Enzymes: No results for input(s): CKTOTAL, CKMB, CKMBINDEX, TROPONINI in the last 168 hours. BNP (last 3 results) No results for input(s): BNP in the last 8760 hours.  ProBNP (last 3 results) No results for input(s): PROBNP in the last 8760 hours.  CBG: Recent Labs  Lab 07/20/21 2346 07/21/21 0403 07/21/21 1609 07/22/21 0021 07/22/21 0345  GLUCAP 103* 103* 109* 117* 109*    Recent Results (from the past 240 hour(s))  C Difficile Quick Screen (NO PCR Reflex)  Status: None   Collection Time: 07/16/21  1:09 AM   Specimen: STOOL  Result Value Ref Range Status   C Diff antigen NEGATIVE NEGATIVE Final   C Diff toxin NEGATIVE NEGATIVE Final   C Diff interpretation No C. difficile detected.  Final    Comment: Performed at [facility], [address], [place], [place] [address]  Blood culture (routine single)     Status: None   Collection Time: 07/18/21  8:35 AM   Specimen: Right Antecubital; Blood  Result Value Ref Range Status   Specimen Description RIGHT ANTECUBITAL  Final   Special Requests   Final    BOTTLES DRAWN AEROBIC AND ANAEROBIC Blood Culture adequate volume   Culture   Final    NO GROWTH 5 DAYS Performed at [facility], [address] [place], [place] [address]    Report Status 07/23/2021 FINAL  Final  Resp Panel by RT-PCR (Flu A&B, Covid) Nasopharyngeal Swab     Status: Abnormal   Collection Time: 07/18/21  9:18 AM   Specimen: Nasopharyngeal Swab; Nasopharyngeal(NP) swabs in vial transport medium  Result Value Ref Range Status   SARS Coronavirus 2 by RT PCR POSITIVE (A) NEGATIVE Final    Comment: RESULT CALLED TO, READ BACK BY AND VERIFIED WITH: OAKLY,B AT 1425 BY HUFFINES,S ON 07/18/21. (NOTE) SARS-CoV-2 target nucleic acids are DETECTED.  The SARS-CoV-2 RNA is generally detectable in  upper respiratory specimens during the acute phase of infection. Positive results are indicative of the presence of the identified virus, but do not rule out bacterial infection or co-infection with other pathogens not detected by the test. Clinical correlation with patient history and other diagnostic information is necessary to determine patient infection status. The expected result is Negative.  Fact Sheet for Patients: EntrepreneurPulse.com.au  Fact Sheet for Healthcare Providers: IncredibleEmployment.be  This test is not yet approved or cleared by the [place] FDA and  has been authorized for detection and/or diagnosis of SARS-CoV-2 by FDA under an Emergency Use Authorization (EUA).  This EUA will remain in effect (meaning this te st can be used) for the duration of  the COVID-19 declaration under Section 564(b)(1) of the Act, 21 U.S.C. section 360bbb-3(b)(1), unless the authorization is terminated or revoked sooner.     Influenza A by PCR NEGATIVE NEGATIVE Final   Influenza B by PCR NEGATIVE NEGATIVE Final    Comment: (NOTE) The Xpert Xpress SARS-CoV-2/FLU/RSV plus assay is intended as an aid in the diagnosis of influenza from Nasopharyngeal swab specimens and should not be used as a sole basis for treatment. Nasal washings and aspirates are unacceptable for Xpert Xpress SARS-CoV-2/FLU/RSV testing.  Fact Sheet for Patients: EntrepreneurPulse.com.au  Fact Sheet for Healthcare Providers: IncredibleEmployment.be  This test is not yet approved or cleared by the [place] FDA and has been authorized for detection and/or diagnosis of SARS-CoV-2 by FDA under an Emergency Use Authorization (EUA). This EUA will remain in effect (meaning this test can be used) for the duration of the COVID-19 declaration under Section 564(b)(1) of the Act, 21 U.S.C. section 360bbb-3(b)(1), unless the authorization is  terminated or revoked.  Performed at [facility], [address], [place], [place] [address]   Blood Culture (routine x 2)     Status: None   Collection Time: 07/18/21 10:22 AM   Specimen: BLOOD  Result Value Ref Range Status   Specimen Description BLOOD RIGHT ANTECUBITAL  Final   Special Requests   Final    BOTTLES DRAWN AEROBIC AND  ANAEROBIC Blood Culture adequate volume   Culture   Final    NO GROWTH 5 DAYS Performed at [facility], [address], [place], [place] [address]    Report Status 07/23/2021 FINAL  Final  MRSA Next Gen by PCR, Nasal     Status: None   Collection Time: 07/18/21  8:51 PM   Specimen: Nasal Mucosa; Nasal Swab  Result Value Ref Range Status   MRSA by PCR Next Gen NOT DETECTED NOT DETECTED Final    Comment: (NOTE) The GeneXpert MRSA Assay (FDA approved for NASAL specimens only), is one component of a comprehensive MRSA colonization surveillance program. It is not intended to diagnose MRSA infection nor to guide or monitor treatment for MRSA infections. Test performance is not FDA approved in patients less than 22 years old. Performed at [facility], [place] [address] [place], [place] [address]       Studies: No results found.    Flora Lipps, MD  [facility] 07/23/2021  If 7PM-7AM, please contact night-coverage

## 2021-07-23 NOTE — Progress Notes (Signed)
ANTICOAGULATION CONSULT NOTE Pharmacy Consult for Heparin  Indication: atrial fibrillation  Allergies  Allergen Reactions   Codeine Itching   Compazine [Prochlorperazine Edisylate]    Prednisone Nausea And Vomiting   Tetanus Toxoids Other (See Comments)    Knot like "hen egg" came up    Vicodin [Hydrocodone-Acetaminophen] Itching    Patient Measurements: Height: '5\' 2"'$  (157.5 cm) Weight: 122.9 kg (270 lb 15.1 oz) IBW/kg (Calculated) : 50.1 Heparin Dosing Weight: 78 kg  Vital Signs: Temp: 97.7 F (36.5 C) (08/29 0056) Temp Source: Oral (08/29 0056) BP: 124/41 (08/29 0056)  Labs: Recent Labs    07/20/21 0250 07/20/21 1208 07/21/21 [id number] 07/21/21 1738 07/22/21 0323 07/22/21 0330 07/22/21 1402 07/23/21 0000 07/23/21 0023  HGB 7.8*  --  7.7*  --  7.3*  --   --   --  7.2*  HCT 27.1*  --  26.6*  --  25.0*  --   --   --  25.4*  PLT 270  --  238  --  219  --   --   --  217  APTT 59*  --   --   --   --   --   --   --   --   HEPARINUNFRC 0.11*   < > 0.15*   < >  --  0.18* 0.19* 0.32  --   CREATININE 1.07*  --  0.85  --  0.66  --   --   --   --    < > = values in this interval not displayed.    Estimated Creatinine Clearance: 79.5 mL/min (by C-G formula based on SCr of 0.66 mg/dL).  Assessment: 75 y.o. female with h/o Afib, Xarelto on hold, for heparin  Goal of Therapy:  Heparin level 0.3-0.5 units/mL Monitor platelets by anticoagulation protocol: Yes   Plan: Continue Heparin at current rate   Phillis Knack, PharmD, BCPS

## 2021-07-23 NOTE — Progress Notes (Signed)
PHARMACY - TOTAL PARENTERAL NUTRITION CONSULT NOTE   Indication: Fistula  Patient Measurements: Height: '5\' 2"'$  (157.5 cm) Weight: 124.9 kg (275 lb 5.7 oz) IBW/kg (Calculated) : 50.1 TPN AdjBW (KG): 66.6 Body mass index is 50.36 kg/m. Usual Weight:   Assessment: 29 YOF with recent R-hemicolectomy on 8/12 in the setting of a cecal mass - consistent with R-colon metastatic adenocarcinoma. 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 Pharmacy consulted to start TPN for nutritional support in the setting of a colocutaneous fistula.    Glucose / Insulin: CBGs <120, off insulin  Electrolytes: Patient refused phlebotomy 8/29 AM due to confusion. Consulted IV team to hold TPN 5-10 min and draw labs from PICC line Phos 2.5 (received 30 mmol), Mg 2.0 (received 4g), CoCa 9.76; Na, K, Cl, and CO2 wnl but trending up  Renal:  SCr 0.52, BUN wnl Hepatic: LFTs/Tibili wnl, TG 73 (8/25), albumin 1.3  Intake / Output; MIVF:  UOP not documented, ostomy output 23m   GI Imaging: 8/24 Abd CT: colocutaneous fistula, 7.4 cm abdominal wall abscess, soft tissue edema, cellulitis vs nec fasciitis 8/25 Abd xray: diffuse abdominal wall emphysema, Nonobstructive bowel gas pattern GI Surgeries / Procedures:  8/12 (last admit): R-hemicolectomy  Central access: PICC replaced 8/25 TPN start date: 8/25 >>  Nutritional Goals: Goal TPN rate is 105 mL/hr (provides 151 g of protein, 312g dextrose, 70g lipids, and 2373 kcals per day)  RD Assessment on 8/25: Goal 2300-2500 kcal, 140-160g protein per day, fluid >/= 2L  Current Nutrition:  NPO  Plan:  Continue TPN to goal 105 mL/hr at 1800 - will provide 151 g protein and 2373 kcal meeting 100% of estimated needs Electrolytes in TPN: Decrease K 30 mEq/L; Continue Na to 125 mEq/L, Ca 5 mEq/L, Mg 9 mEq/L, Phos 18 mmol/L, Cl:Ac 1:1 Add standard MVI and trace elements to TPN Monitor standard TPN labs Mon/Thurs    Thank you for allowing pharmacy to be a part of this patient's care.  LBenetta Spar PharmD, BCPS, BCCP Clinical Pharmacist  Please check AMION for all [place]phone numbers After 10:00 PM, call [phone]

## 2021-07-23 NOTE — Consult Note (Signed)
[place] Nurse wound follow up Wound type:colocutaneous fistula.  Pouched with Eakin, but suction is not working.  Stool had clogged red rubber cath. I have cut the holes larger to accommodate feculent particulates.  Measurement: 8 cm x 6 cm x 6 cm  Wound bed:not visible, fills with feculent effluent after cleaning.  Drainage (amount, consistency, odor) continuous feculent output. Odor, stool.  Periwound:Skin intact but erythematous.  Oozing of stool noted beneath pouch as Suction was not on.  Dressing procedure/placement/frequency:Removed old Eakin and cleansed skin.  Cleaned clog out of red rubber and cut larger holes. Skin prep and barrier rings to periwound skin (and over umbilicus) Applied new Eakin pouch and warmed to promote seal. Fed rubber cath through the bottom spout and sealed with pink waterproof tape  Suction turned on at 60 and had adequate seal to manage effluent.  (Catheter is not over the open wound- but in close enough proximity to catch )  Will change weekly and PRN leakage.   Emotional support provided to patient as she has concerns about her health (she wants to eat and drink, doesn't understand her next steps), concerns about her care (the staff asked her to help turn and move in bed).  I explain what I am doing for her wounds, show her the TPN nutrition she is receiving and ask her to come up with her top 3 or 4 questions she has for her medical team when they come in.  I turned on the television and the clock is placed where she can see it. She states she feels better. I will order additional supplies today.  Will follow.  Domenic Moras MSN, RN, FNP-BC CWON Wound, Ostomy, Continence Nurse Pager [phone]

## 2021-07-24 DIAGNOSIS — A419 Sepsis, unspecified organism: Secondary | ICD-10-CM | POA: Diagnosis not present

## 2021-07-24 DIAGNOSIS — C189 Malignant neoplasm of colon, unspecified: Secondary | ICD-10-CM | POA: Diagnosis not present

## 2021-07-24 DIAGNOSIS — N179 Acute kidney failure, unspecified: Secondary | ICD-10-CM | POA: Diagnosis not present

## 2021-07-24 DIAGNOSIS — L03311 Cellulitis of abdominal wall: Secondary | ICD-10-CM | POA: Diagnosis not present

## 2021-07-24 LAB — CBC
HCT: 24.4 % — ABNORMAL LOW (ref 36.0–46.0)
Hemoglobin: 7 g/dL — ABNORMAL LOW (ref 12.0–15.0)
MCH: 22.2 pg — ABNORMAL LOW (ref 26.0–34.0)
MCHC: 28.7 g/dL — ABNORMAL LOW (ref 30.0–36.0)
MCV: 77.5 fL — ABNORMAL LOW (ref 80.0–100.0)
Platelets: 223 10*3/uL (ref 150–400)
RBC: 3.15 MIL/uL — ABNORMAL LOW (ref 3.87–5.11)
RDW: 29.5 % — ABNORMAL HIGH (ref 11.5–15.5)
WBC: 10.1 10*3/uL (ref 4.0–10.5)
nRBC: 0.2 % (ref 0.0–0.2)

## 2021-07-24 LAB — HEPARIN LEVEL (UNFRACTIONATED): Heparin Unfractionated: 0.6 IU/mL (ref 0.30–0.70)

## 2021-07-24 MED ORDER — ENOXAPARIN SODIUM 120 MG/0.8ML IJ SOSY
120.0000 mg | PREFILLED_SYRINGE | Freq: Two times a day (BID) | INTRAMUSCULAR | Status: DC
  Administered 2021-07-24 – 2021-07-28 (×8): 120 mg via SUBCUTANEOUS
  Filled 2021-07-24 (×9): qty 0.8

## 2021-07-24 MED ORDER — LORAZEPAM 0.5 MG PO TABS
0.5000 mg | ORAL_TABLET | Freq: Once | ORAL | Status: DC | PRN
  Filled 2021-07-24: qty 1

## 2021-07-24 MED ORDER — TRAVASOL 10 % IV SOLN
INTRAVENOUS | Status: AC
  Filled 2021-07-24: qty 1512

## 2021-07-24 MED ORDER — LORAZEPAM 2 MG/ML IJ SOLN
0.5000 mg | Freq: Once | INTRAMUSCULAR | Status: AC | PRN
  Administered 2021-07-25: 0.5 mg via INTRAVENOUS
  Filled 2021-07-24: qty 1

## 2021-07-24 MED ORDER — ENOXAPARIN SODIUM 120 MG/0.8ML IJ SOSY
120.0000 mg | PREFILLED_SYRINGE | INTRAMUSCULAR | Status: AC
  Administered 2021-07-24: 120 mg via SUBCUTANEOUS
  Filled 2021-07-24: qty 0.8

## 2021-07-24 NOTE — Progress Notes (Signed)
ANTICOAGULATION CONSULT NOTE - Follow Up Consult  Pharmacy Consult for Heparin>>Lovenox Indication: Afib  Allergies  Allergen Reactions   Codeine Itching   Compazine [Prochlorperazine Edisylate]    Prednisone Nausea And Vomiting   Tetanus Toxoids Other (See Comments)    Knot like "hen egg" came up    Vicodin [Hydrocodone-Acetaminophen] Itching    Patient Measurements: Height: '5\' 2"'$  (157.5 cm) Weight: 124.3 kg (274 lb 0.5 oz) IBW/kg (Calculated) : 50.1 Heparin Dosing Weight:   Vital Signs: Temp: 98 F (36.7 C) (08/30 1200) Temp Source: Oral (08/30 1200) BP: 111/63 (08/30 1200) Pulse Rate: 88 (08/30 1200)  Labs: Recent Labs    07/22/21 0323 07/22/21 0330 07/23/21 0000 07/23/21 0023 07/23/21 0842 07/23/21 1449 07/24/21 0249  HGB 7.3*  --   --  7.2*  --   --  7.0*  HCT 25.0*  --   --  25.4*  --   --  24.4*  PLT 219  --   --  217  --   --  223  HEPARINUNFRC  --    < > 0.32  --   --  0.39 0.60  CREATININE 0.66  --   --   --  0.52  --   --    < > = values in this interval not displayed.     Estimated Creatinine Clearance: 80.1 mL/min (by C-G formula based on SCr of 0.52 mg/dL).   Assessment:  Anticoag: Xarelto PTA for afib>> on heparin while NPO. FOBT + 8/23. - HL 0.6 (goal 0.3-0.5). Hgb only 7. Pt refusing labs 8/29 AM. Convert to LMWH with no plans for surgery.   Goal of Therapy:  Heparin level 0.3-0.5 units/ml LMWH 0.6-1.2 Monitor platelets by anticoagulation protocol: Yes   Plan:  Iv Heparin>>Lovenox '120mg'$  SQ q12 hrs CBC q72h on LMWH   Reegan Bouffard S. Alford Highland, PharmD, BCPS Clinical Staff Pharmacist [email or web address] Eilene Ghazi Stillinger 07/24/2021,12:24 PM

## 2021-07-24 NOTE — Progress Notes (Signed)
ANTICOAGULATION CONSULT NOTE - Follow Up Consult  Pharmacy Consult for Heparin + Zosyn Indication:  Afib + sepsis/fistula  Allergies  Allergen Reactions   Codeine Itching   Compazine [Prochlorperazine Edisylate]    Prednisone Nausea And Vomiting   Tetanus Toxoids Other (See Comments)    Knot like "hen egg" came up    Vicodin [Hydrocodone-Acetaminophen] Itching    Patient Measurements: Height: '5\' 2"'$  (157.5 cm) Weight: 124.3 kg (274 lb 0.5 oz) IBW/kg (Calculated) : 50.1 Heparin Dosing Weight:   Vital Signs: Temp: 98.8 F (37.1 C) (08/30 0740) Temp Source: Oral (08/30 0740) BP: 113/74 (08/30 0740) Pulse Rate: 103 (08/30 0740)  Labs: Recent Labs    07/22/21 0323 07/22/21 0330 07/23/21 0000 07/23/21 0023 07/23/21 0842 07/23/21 1449 07/24/21 0249  HGB 7.3*  --   --  7.2*  --   --  7.0*  HCT 25.0*  --   --  25.4*  --   --  24.4*  PLT 219  --   --  217  --   --  223  HEPARINUNFRC  --    < > 0.32  --   --  0.39 0.60  CREATININE 0.66  --   --   --  0.52  --   --    < > = values in this interval not displayed.    Estimated Creatinine Clearance: 80.1 mL/min (by C-G formula based on SCr of 0.52 mg/dL).   Assessment: Anticoag: Xarelto PTA for afib>> on heparin while NPO. - HL 0.6 (goal 0.3-0.5). Hgb only 7. Pt refusing labs 8/29 AM.   ID: Septic shock, Colocutaneous fistula. LA improved. WBC WNL, afebrile  Vancomycin 8/24 >>8/25 Anidul 8/24>> 8/25 Zosyn 8/24 >> 8/25, restarted 8/25>> cefepime 8/24 >> 8/24 Meropenem 8/25>>8/25 Flagyl 8/24 x 1 in ED Remdesivir 8/25-8/27  Microbiology results: 8/24 BCx: negative 08/24 MRSA PCR - 08/24 C diff - 08/24 COVID +  Goal of Therapy:  Heparin level 0.3-0.5 units/ml Monitor platelets by anticoagulation protocol: Yes   Plan:  Zosyn 3.375g IV q8hr day #6/7 Decrease IV heparin to 1850 units/hr Daily HL and CBC   Hiep Ollis S. Alford Highland, PharmD, BCPS Clinical Staff Pharmacist [email or web address] Alford Highland, [employer] 07/24/2021,8:32 AM

## 2021-07-24 NOTE — Progress Notes (Signed)
PROGRESS NOTE  Lauren [name] [id number] DOB: 1946/04/09 DOA: 07/18/2021 PCP: Pcp, No   LOS: 6 days   Brief narrative:  Patient is 75 years old female with past medical history of atrial fibrillation, combined heart failure, hypertension, lymphedema, recent admission to the hospital for GI bleed secondary to cecal mass status post right hemicolectomy on 07/06/2021 at outside hospital.  Pathology was consistent with metastatic adenocarcinoma was at [facility] skilled nursing facility when she was noted to be hypotensive and complained of nausea, abdominal pain and back pain.  She was also found to be COVID positive.  Patient was then brought in hospital and received IV fluids vasopressors antibiotic.  CT scan of the abdomen showed the fistula extending from the bowel adjacent to the suture through an umbilical hernia into the abdominal wall with fluid collection in the anterior abdominal wall and gas tracking.  Patient was admitted to the ICU on 07/18/2021 and was started on vasopressors and antibiotic.  On 07/19/21 patient was started on TPN and was taken off of Levophed.  Subsequently, patient was considered stable for transfer out of ICU.  Assessment/Plan:  Principal Problem:   Septic shock (HCC) Active Problems:   Hypokalemia   Morbid obesity with BMI of 50.0-59.9, adult (HCC)   Colon carcinoma ([place])   Enterocutaneous fistula   Cellulitis   AKI (acute kidney injury) ([place])   Hyponatremia  Septic shock with abdominal wall fluid collection- improved.  Was on vasopressors , currently off.  Continue IV Zosyn.  Blood cultures negative in 5 days.  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  T3 N2 M0 status.  CT scan showing some fistula with abdominal wall collection.  Surgery on board.  Continue TPN, n.p.o. bowel rest and antibiotics.  Currently on Zosyn, at least for 7 days currently, day 6/7.   Management as per surgery.  Incidental COVID 19 positive. Completed remdesivir.  On room air.   Chronic combined CHF, history of atrial fibrillation  Patient was on Xarelto as outpatient.  On metoprolol IV every 6hourly.  on heparin drip.  Since there is no plan for surgical intervention, will change to Lovenox for now.  AKI Improved.  Latest creatinine of 0.5.   Anemia, microcytic No signs of active bleeding.  We will continue to monitor.  Latest hemoglobin of 7.0   Hyponatremia,  Improved.  Latest sodium of 136  Hypokalemia Improved after replacement.  Latest potassium of 4.6  Hypomagnesia Resolved, Latest magnesium of 2.0, on TPN.  Continue to monitor electrolytes  DVT prophylaxis: Place and maintain sequential compression device Start: 07/18/21 1202 Place TED hose Start: 07/18/21 1202   Code Status: Full code  Family Communication:  None today.  spoke with the patient's brother on 8/28  Status is: Inpatient  Remains inpatient appropriate because: IV treatments appropriate due to intensity of illness or inability to take PO, and Inpatient level of care appropriate due to severity of illness  Dispo: The patient is from: SNF              Anticipated d/c is to: SNF              Patient currently is not medically stable to d/c.   Difficult to place patient No   Consultants: General surgery PCCM  Procedures: PICC line placement  Anti-infectives:  Remdesivir- completed Zosyn IV   Subjective: Today, patient was seen and examined at bedside.  Feels little more  comfortable today.  Denies any nausea vomiting or increasing abdominal pain. Objective: Vitals:   07/24/21 0740 07/24/21 0900  BP: 113/74 118/60  Pulse: (!) 103 100  Resp: 18 19  Temp: 98.8 F (37.1 C) 97.6 F (36.4 C)  SpO2: 98% 99%    Intake/Output Summary (Last 24 hours) at 07/24/2021 1051 Last data filed at 07/24/2021 0459 Gross per 24 hour  Intake 3596.26 ml  Output 2400 ml  Net 1196.26 ml     Filed Weights   07/22/21 0413 07/23/21 0500 07/24/21 0428  Weight: 122.9 kg 124.9 kg 124.3 kg   Body mass index is 50.12 kg/m.   Physical Exam: General: Morbidly obese,, not in obvious distress HENT:   No scleral pallor or icterus noted. Oral mucosa is moist.  Chest:  Clear breath sounds.  Diminished breath sounds bilaterally. No crackles or wheezes.  CVS: S1 &S2 heard. No murmur.  Regular rate and rhythm. Abdomen: Soft, nontender, nondistended.  Eakin's pouch in place.,  Hypoactive bowel sounds  Extremities: No cyanosis, clubbing or edema.  Peripheral pulses are palpable.  Left upper extremity PICC line in place. Psych: Alert, awake and oriented, normal mood CNS:  No cranial nerve deficits.  Power equal in all extremities.   Skin: Warm and dry.  No rashes noted.  Data Review: I have personally reviewed the following laboratory data and studies,  CBC: Recent Labs  Lab 07/18/21 0830 07/19/21 0330 07/20/21 0250 07/21/21 0611 07/22/21 0323 07/23/21 0023 07/24/21 0249  WBC 33.1*   < > 12.8* 9.7 9.8 9.8 10.1  NEUTROABS 28.8*  --   --   --   --   --   --   HGB 9.8*   < > 7.8* 7.7* 7.3* 7.2* 7.0*  HCT 34.2*   < > 27.1* 26.6* 25.0* 25.4* 24.4*  MCV 77.9*   < > 75.5* 76.0* 76.0* 77.4* 77.5*  PLT 318   < > 270 238 219 217 223   < > = values in this interval not displayed.    Basic Metabolic Panel: Recent Labs  Lab 07/19/21 0330 07/19/21 2120 07/20/21 0250 07/21/21 0611 07/22/21 0323 07/23/21 0842  NA 131*  --  132* 134* 136 138  K 3.2*  --  3.1* 3.9 4.3 4.6  CL 99  --  102 105 104 108  CO2 22  --  '23 25 26 27  '$ GLUCOSE 68*  --  112* 110* 111* 97  BUN 17  --  '15 14 13 18  '$ CREATININE 1.36*  --  1.07* 0.85 0.66 0.52  CALCIUM 6.9*  --  7.4* 7.5* 7.6* 7.6*  MG 1.3* 2.3 2.2 1.8 1.8 2.0  PHOS 3.6  --  3.1 2.2* 2.2* 2.5    Liver Function Tests: Recent Labs  Lab 07/18/21 0830 07/19/21 0330 07/20/21 0250 07/23/21 0842  AST '28 25 17 15  '$ ALT '17 17 15 11  '$ ALKPHOS  102 76 100 89  BILITOT 1.7* 1.1 0.7 0.3  PROT 5.4* 4.5* 4.5* 4.8*  ALBUMIN 2.0* 1.5* 1.4* 1.3*    No results for input(s): LIPASE, AMYLASE in the last 168 hours. No results for input(s): AMMONIA in the last 168 hours. Cardiac Enzymes: No results for input(s): CKTOTAL, CKMB, CKMBINDEX, TROPONINI in the last 168 hours. BNP (last 3 results) No results for input(s): BNP in the last 8760 hours.  ProBNP (last 3 results) No results for input(s): PROBNP in the last 8760 hours.  CBG: Recent Labs  Lab 07/20/21 2346 07/21/21 0403  07/21/21 1609 07/22/21 0021 07/22/21 0345  GLUCAP 103* 103* 109* 117* 109*    Recent Results (from the past 240 hour(s))  C Difficile Quick Screen (NO PCR Reflex)     Status: None   Collection Time: 07/16/21  1:09 AM   Specimen: STOOL  Result Value Ref Range Status   C Diff antigen NEGATIVE NEGATIVE Final   C Diff toxin NEGATIVE NEGATIVE Final   C Diff interpretation No C. difficile detected.  Final    Comment: Performed at [facility], [address], [place], [place] [address]  Blood culture (routine single)     Status: None   Collection Time: 07/18/21  8:35 AM   Specimen: Right Antecubital; Blood  Result Value Ref Range Status   Specimen Description RIGHT ANTECUBITAL  Final   Special Requests   Final    BOTTLES DRAWN AEROBIC AND ANAEROBIC Blood Culture adequate volume   Culture   Final    NO GROWTH 5 DAYS Performed at [facility], [address] [place], [place] [address]    Report Status 07/23/2021 FINAL  Final  Resp Panel by RT-PCR (Flu A&B, Covid) Nasopharyngeal Swab     Status: Abnormal   Collection Time: 07/18/21  9:18 AM   Specimen: Nasopharyngeal Swab; Nasopharyngeal(NP) swabs in vial transport medium  Result Value Ref Range Status   SARS Coronavirus 2 by RT PCR POSITIVE (A) NEGATIVE Final    Comment: RESULT CALLED TO, READ BACK BY AND VERIFIED WITH: OAKLY,B AT 1425 BY HUFFINES,S ON 07/18/21. (NOTE) SARS-CoV-2 target nucleic acids are  DETECTED.  The SARS-CoV-2 RNA is generally detectable in upper respiratory specimens during the acute phase of infection. Positive results are indicative of the presence of the identified virus, but do not rule out bacterial infection or co-infection with other pathogens not detected by the test. Clinical correlation with patient history and other diagnostic information is necessary to determine patient infection status. The expected result is Negative.  Fact Sheet for Patients: EntrepreneurPulse.com.au  Fact Sheet for Healthcare Providers: IncredibleEmployment.be  This test is not yet approved or cleared by the [place] FDA and  has been authorized for detection and/or diagnosis of SARS-CoV-2 by FDA under an Emergency Use Authorization (EUA).  This EUA will remain in effect (meaning this te st can be used) for the duration of  the COVID-19 declaration under Section 564(b)(1) of the Act, 21 U.S.C. section 360bbb-3(b)(1), unless the authorization is terminated or revoked sooner.     Influenza A by PCR NEGATIVE NEGATIVE Final   Influenza B by PCR NEGATIVE NEGATIVE Final    Comment: (NOTE) The Xpert Xpress SARS-CoV-2/FLU/RSV plus assay is intended as an aid in the diagnosis of influenza from Nasopharyngeal swab specimens and should not be used as a sole basis for treatment. Nasal washings and aspirates are unacceptable for Xpert Xpress SARS-CoV-2/FLU/RSV testing.  Fact Sheet for Patients: EntrepreneurPulse.com.au  Fact Sheet for Healthcare Providers: IncredibleEmployment.be  This test is not yet approved or cleared by the [place] FDA and has been authorized for detection and/or diagnosis of SARS-CoV-2 by FDA under an Emergency Use Authorization (EUA). This EUA will remain in effect (meaning this test can be used) for the duration of the COVID-19 declaration under Section 564(b)(1) of the Act, 21  U.S.C. section 360bbb-3(b)(1), unless the authorization is terminated or revoked.  Performed at [facility], [address], [place], [place] [address]   Blood Culture (routine x 2)     Status: None   Collection Time: 07/18/21  10:22 AM   Specimen: BLOOD  Result Value Ref Range Status   Specimen Description BLOOD RIGHT ANTECUBITAL  Final   Special Requests   Final    BOTTLES DRAWN AEROBIC AND ANAEROBIC Blood Culture adequate volume   Culture   Final    NO GROWTH 5 DAYS Performed at [facility], [address] [place], [place] [address]    Report Status 07/23/2021 FINAL  Final  MRSA Next Gen by PCR, Nasal     Status: None   Collection Time: 07/18/21  8:51 PM   Specimen: Nasal Mucosa; Nasal Swab  Result Value Ref Range Status   MRSA by PCR Next Gen NOT DETECTED NOT DETECTED Final    Comment: (NOTE) The GeneXpert MRSA Assay (FDA approved for NASAL specimens only), is one component of a comprehensive MRSA colonization surveillance program. It is not intended to diagnose MRSA infection nor to guide or monitor treatment for MRSA infections. Test performance is not FDA approved in patients less than 52 years old. Performed at [facility], [place] [address] [place], [place] [address]       Studies: No results found.    Flora Lipps, MD  [facility] 07/24/2021  If 7PM-7AM, please contact night-coverage

## 2021-07-24 NOTE — Progress Notes (Signed)
PHARMACY - TOTAL PARENTERAL NUTRITION CONSULT NOTE  Indication: Fistula  Patient Measurements: Height: '5\' 2"'$  (0000000 cm) Weight: 124.3 kg (274 lb 0.5 oz) IBW/kg (Calculated) : 50.1 TPN AdjBW (KG): 66.6 Body mass index is 50.12 kg/m.  Assessment:  23 YOF with recent R-hemicolectomy on 8/12 in the setting of a cecal mass - consistent with R-colon metastatic adenocarcinoma. 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 Pharmacy consulted to manage TPN for nutritional support in the setting of a colocutaneous fistula.    Glucose / Insulin: CBGs <120, off SSI 07/22/21 Electrolytes: 8/29 labs - all WNL (K high normal and Phos low normal) Renal:  SCr < 1, BUN WNL Hepatic: LFTs / tibili / TG WNL, albumin 1.3  Intake / Output; MIVF:  UOP 2438m, ostomy 057m  GI Imaging: 8/24 Abd CT: colocutaneous fistula, 7.4cm abd wall abscess, soft tissue edema, cellulitis vs nec fasciitis 8/25 Abd xray: diffuse abd wall emphysema, nonobstructive bowel gas pattern GI Surgeries / Procedures:  8/12 (last admit): R-hemicolectomy  Central access: PICC replaced 07/19/21 TPN start date: 07/19/21  Nutritional Goals (RD assessment on 8/25): 2300-2500 kcal, 140-160g AA, fluid >/= 2L per day  Current Nutrition:  TPN  Plan:  Continue TPN at goal rate of 105 mL/hr, providing 151g AA and 2373 kCal meeting 100% of estimated needs Electrolytes in TPN: Na 125 mEq/L, K decreased to 304mL on 8/29, Ca 4mE25m, Mg 9mEq27m increase Phos to 22mmo11m Cl:Ac 1:1 Add standard MVI and trace elements to TPN Monitor standard TPN labs Mon/Thurs   Derron Pipkins D. Cristi Gwynn, Mina MarblemD, BCPS, BCCCP [place]2022, 7:13 AM

## 2021-07-24 NOTE — Progress Notes (Signed)
General Surgery Follow Up Note  Subjective:    Overnight Issues:   Objective:  Vital signs for last 24 hours: Temp:  [97.6 F (36.4 C)-98.8 F (37.1 C)] 97.6 F (36.4 C) (08/30 0900) Pulse Rate:  [91-115] 100 (08/30 0900) Resp:  [16-20] 19 (08/30 0900) BP: (96-121)/(59-74) 118/60 (08/30 0900) SpO2:  [97 %-99 %] 99 % (08/30 0900) Weight:  [124.3 kg] 124.3 kg (08/30 0428)  Hemodynamic parameters for last 24 hours:    Intake/Output from previous day: 08/29 0701 - 08/30 0700 In: 3596.3 [I.V.:3450.5; IV Piggyback:145.8] Out: 2400 [Urine:2400]  Intake/Output this shift: No intake/output data recorded.  Vent settings for last 24 hours:    Physical Exam:  Gen: comfortable, no distress Neuro: non-focal exam HEENT: PERRL Neck: supple CV: RRR Pulm: unlabored breathing Abd: soft, NT, eakins pouch with red rubber GU: clear yellow urine Extr: wwp, no edema   Results for orders placed or performed during the hospital encounter of 07/18/21 (from the past 24 hour(s))  Triglycerides     Status: None   Collection Time: 07/23/21  2:49 PM  Result Value Ref Range   Triglycerides 48 <150 mg/dL  Heparin level (unfractionated)     Status: None   Collection Time: 07/23/21  2:49 PM  Result Value Ref Range   Heparin Unfractionated 0.39 0.30 - 0.70 IU/mL  CBC     Status: Abnormal   Collection Time: 07/24/21  2:49 AM  Result Value Ref Range   WBC 10.1 4.0 - 10.5 K/uL   RBC 3.15 (L) 3.87 - 5.11 MIL/uL   Hemoglobin 7.0 (L) 12.0 - 15.0 g/dL   HCT 24.4 (L) 36.0 - 46.0 %   MCV 77.5 (L) 80.0 - 100.0 fL   MCH 22.2 (L) 26.0 - 34.0 pg   MCHC 28.7 (L) 30.0 - 36.0 g/dL   RDW 29.5 (H) 11.5 - 15.5 %   Platelets 223 150 - 400 K/uL   nRBC 0.2 0.0 - 0.2 %  Heparin level (unfractionated)     Status: None   Collection Time: 07/24/21  2:49 AM  Result Value Ref Range   Heparin Unfractionated 0.60 0.30 - 0.70 IU/mL    Assessment & Plan:  Present on Admission:  Colon carcinoma (HCC)   Enterocutaneous fistula  Cellulitis  AKI (acute kidney injury) ([place])  Hyponatremia    LOS: 6 days   Additional comments:I reviewed the patient's new clinical lab test results.   and I reviewed the patients new imaging test results.    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 Arnoldo Morale at [facility] on 8/12 - path w/ adenocarcinoma  - Midline wound opened by Dr. Arnoldo Morale at bedside 8/24 and now has stool decompressing through CC fistula  - Currently has eakin's pouch with suction to aid in collection of output. WOCN following - TPN and monitor strict I/O (please document stool output so can track if this is low or high output fistula). Okay for sips with meds and ice chips.  - Cont abx. Afebrile and wbc normalized.    FEN - NPO. TPN. IVF per primary VTE - SCDs, on heparin gtt ID - On Zosyn. Afebrile. WBC normalized. Afebrile   COVID + Sepsis - off pressors, improved. No on [place] service.  A. Fib on Xarelto at home - on heparin gtt  ABL anemia - FOBT positive 8/24. Monitor. Hgb 9.8 > 8.5 > 7.8>7.7 > 7.3 Hypothyroidism AKI - resolved Severe protein calorie malnutrition - albumin  1.3. Pre-alb <5. On TNA   Jesusita Oka, MD Trauma & General Surgery Please use [email or web address] to contact on call provider  07/24/2021  *Care during the described time interval was provided by me. I have reviewed this patient's available data, including medical history, events of note, physical examination and test results as part of my evaluation.

## 2021-07-25 ENCOUNTER — Ambulatory Visit (HOSPITAL_COMMUNITY): Payer: Medicare HMO | Admitting: Hematology

## 2021-07-25 DIAGNOSIS — C189 Malignant neoplasm of colon, unspecified: Secondary | ICD-10-CM | POA: Diagnosis not present

## 2021-07-25 DIAGNOSIS — D509 Iron deficiency anemia, unspecified: Secondary | ICD-10-CM | POA: Diagnosis not present

## 2021-07-25 DIAGNOSIS — I4821 Permanent atrial fibrillation: Secondary | ICD-10-CM

## 2021-07-25 DIAGNOSIS — I5022 Chronic systolic (congestive) heart failure: Secondary | ICD-10-CM

## 2021-07-25 DIAGNOSIS — K632 Fistula of intestine: Secondary | ICD-10-CM | POA: Diagnosis not present

## 2021-07-25 MED ORDER — METOPROLOL TARTRATE 5 MG/5ML IV SOLN: 2.5000 mg | Freq: Once | INTRAVENOUS | Status: AC

## 2021-07-25 MED ORDER — METOPROLOL TARTRATE 5 MG/5ML IV SOLN
5.0000 mg | Freq: Four times a day (QID) | INTRAVENOUS | Status: DC
  Administered 2021-07-25 (×2): 5 mg via INTRAVENOUS
  Filled 2021-07-25 (×2): qty 5

## 2021-07-25 MED ORDER — TRAVASOL 10 % IV SOLN
INTRAVENOUS | Status: AC
  Filled 2021-07-25: qty 1512

## 2021-07-25 MED ORDER — METOPROLOL TARTRATE 5 MG/5ML IV SOLN
INTRAVENOUS | Status: AC
  Administered 2021-07-25: 2.5 mg via INTRAVENOUS
  Filled 2021-07-25: qty 5

## 2021-07-25 MED ORDER — IBUPROFEN 100 MG PO CHEW: 200.0000 mg | CHEWABLE_TABLET | Freq: Three times a day (TID) | ORAL | Status: DC | PRN

## 2021-07-25 MED ORDER — METHOCARBAMOL 1000 MG/10ML IJ SOLN
500.0000 mg | Freq: Three times a day (TID) | INTRAVENOUS | Status: DC | PRN
  Administered 2021-07-25: 500 mg via INTRAVENOUS
  Filled 2021-07-25: qty 500

## 2021-07-25 NOTE — Progress Notes (Signed)
PHARMACY - TOTAL PARENTERAL NUTRITION CONSULT NOTE  Indication: Fistula  Patient Measurements: Height: '5\' 2"'$  (157.5 cm) Weight: 124.3 kg (274 lb 0.5 oz) IBW/kg (Calculated) : 50.1 TPN AdjBW (KG): 66.6 Body mass index is 50.12 kg/m.  Assessment:  31 YOF with recent R-hemicolectomy on 8/12 in the setting of a cecal mass - consistent with R-colon metastatic adenocarcinoma. 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 Pharmacy consulted to manage TPN for nutritional support in the setting of a colocutaneous fistula.    Glucose / Insulin: CBGs <120, off SSI 07/22/21 Electrolytes: 8/29 labs - all WNL (K high normal and Phos low normal) Renal:  SCr < 1, BUN WNL Hepatic: LFTs / tibili / TG WNL, albumin 1.3  Intake / Output; MIVF:  UOP 1584m, ostomy 7090m  GI Imaging: 8/24 Abd CT: colocutaneous fistula, 7.4cm abd wall abscess, soft tissue edema, cellulitis vs nec fasciitis 8/25 Abd xray: diffuse abd wall emphysema, nonobstructive bowel gas pattern GI Surgeries / Procedures:  8/12 (last admit): R-hemicolectomy  Central access: PICC replaced 07/19/21 TPN start date: 07/19/21  Nutritional Goals (RD assessment on 8/25): 2300-2500 kcal, 140-160g AA, fluid >/= 2L per day  Current Nutrition:  TPN  Plan:  Continue TPN at goal rate of 105 mL/hr, providing 151g AA and 2373 kCal meeting 100% of estimated needs Electrolytes in TPN: Na 125 mEq/L, K decreased to 3059mL on 8/29, Ca 4mE68m, Mg 9mEq26m increase Phos to 22mmo29mon 8/30, Cl:Ac 1:1 Add standard MVI and trace elements to TPN Monitor standard TPN labs Mon/Thurs   Lauren [name] D. Lauren [name], Lauren MarblemD, BCPS, BCCCP [place]2022, 7:12 AM

## 2021-07-25 NOTE — Progress Notes (Signed)
PROGRESS NOTE  Lauren [name] [id number] DOB: [birth year] DOA: 07/18/2021 PCP: Pcp, No   LOS: 7 days   Brief narrative: Patient is 75 years old female with past medical history of atrial fibrillation, combined heart failure, hypertension, lymphedema, recent admission to the hospital for GI bleed secondary to cecal mass status post right hemicolectomy on 07/06/2021 at outside hospital.  Pathology was consistent with metastatic adenocarcinoma was at [facility] skilled nursing facility when she was noted to be hypotensive and complained of nausea, abdominal pain and back pain.  She was also found to be COVID positive.  Patient was then brought in hospital and received IV fluids vasopressors antibiotic.  CT scan of the abdomen showed the fistula extending from the bowel adjacent to the suture through an umbilical hernia into the abdominal wall with fluid collection in the anterior abdominal wall and gas tracking.  Patient was admitted to the ICU on 07/18/2021 and was started on vasopressors and antibiotic.  On 07/19/21 patient was started on TPN and was taken off of Levophed.  Subsequently, patient was considered stable for transfer out of ICU.  Assessment/Plan:  Septic shock with abdominal wall fluid collection Patient seems to be stable from a sepsis standpoint.  Has been off of vasopressors.  Remains on Zosyn.  Cultures have been negative.    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  T3 N2 M0 status.  CT scan showing some fistula with abdominal wall collection.   General surgery continues to follow. Patient is on bowel rest.  Remains on TPN.  Remains on Zosyn.  Plan was to do antibiotics for 7 days.  Incidental COVID 19 positive. Completed remdesivir.  On room air.   Permanent atrial fibrillation Patient was on Xarelto as outpatient.  On metoprolol IV every 6hourly.  Was  on heparin infusion.  Changed over to Lovenox.    Chronic systolic CHF Care everywhere was reviewed.  She had an echocardiogram in August 2022 at [facility] which showed EF to be 30%. Seems to be fairly euvolemic at this time.  Continue to monitor volume status closely.  Acute kidney injury Resolved.   Anemia, microcytic No signs of active bleeding.  Transfuse for hemoglobin less than 7.  Noted to be 7.0 as of yesterday.  We will recheck labs tomorrow.   Hyponatremia/hypokalemia/hypomagnesemia Continue to monitor periodically.  Replete as necessary   Goals of care Request palliative care to see this patient to initiate goals of care conversation with patient and family.   DVT prophylaxis: On Lovenox therapeutic  Code Status: Full code  Family Communication:  None today.  spoke with the patient's brother on 8/28  Status is: Inpatient  Remains inpatient appropriate because: IV treatments appropriate due to intensity of illness or inability to take PO, and Inpatient level of care appropriate due to severity of illness  Dispo: The patient is from: SNF              Anticipated d/c is to: SNF              Patient currently is not medically stable to d/c.   Difficult to place patient No   Consultants: General surgery PCCM  Procedures: PICC line placement  Anti-infectives:  Remdesivir- completed Zosyn IV   Subjective: Patient complains of feeling uncomfortable on the bed.  Requesting repositioning.  Denies any chest pain.  Mildly short of breath.  No nausea vomiting.  Occasional abdominal pain.   Objective: Vitals:   07/25/21 1100 07/25/21 1200  BP:  (!) 144/88  Pulse: 62 (!) 126  Resp: (!) 23 (!) 23  Temp:  97.9 F (36.6 C)  SpO2: 94% 97%    Intake/Output Summary (Last 24 hours) at 07/25/2021 1250 Last data filed at 07/25/2021 0750 Gross per 24 hour  Intake 2563.76 ml  Output 1700 ml  Net 863.76 ml    Filed Weights   07/23/21 0500 07/24/21 0428 07/25/21 0307   Weight: 124.9 kg 124.3 kg 124.3 kg   Body mass index is 50.12 kg/m.   Physical Exam:  General appearance: Awake alert.  Distracted.  In no distress Resp: Mildly tachypneic.  No use of accessory muscles.  Coarse breath sound.  No definite crackles. Cardio: S1-S2 is tachycardic and irregularly irregular .  No S3-S4.  No rubs murmurs or bruit GI: Abdomen is soft.  Eakin's pouch in place.  Hypoactive bowel sounds.  Mildly tender without any rebound rigidity or guarding.  No obvious masses or organomegaly. Extremities: No edema.  Moving all 4 extremities Neurologic: No focal neurological deficits.     Data Review: I have personally reviewed the following laboratory data and studies,  CBC: Recent Labs  Lab 07/20/21 0250 07/21/21 0611 07/22/21 0323 07/23/21 0023 07/24/21 0249  WBC 12.8* 9.7 9.8 9.8 10.1  HGB 7.8* 7.7* 7.3* 7.2* 7.0*  HCT 27.1* 26.6* 25.0* 25.4* 24.4*  MCV 75.5* 76.0* 76.0* 77.4* 77.5*  PLT 270 238 219 217 [id number]    Basic Metabolic Panel: Recent Labs  Lab 07/19/21 0330 07/19/21 2120 07/20/21 0250 07/21/21 0611 07/22/21 0323 07/23/21 0842  NA 131*  --  132* 134* 136 138  K 3.2*  --  3.1* 3.9 4.3 4.6  CL 99  --  102 105 104 108  CO2 22  --  '23 25 26 27  '$ GLUCOSE 68*  --  112* 110* 111* 97  BUN 17  --  '15 14 13 18  '$ CREATININE 1.36*  --  1.07* 0.85 0.66 0.52  CALCIUM 6.9*  --  7.4* 7.5* 7.6* 7.6*  MG 1.3* 2.3 2.2 1.8 1.8 2.0  PHOS 3.6  --  3.1 2.2* 2.2* 2.5    Liver Function Tests: Recent Labs  Lab 07/19/21 0330 07/20/21 0250 07/23/21 0842  AST '25 17 15  '$ ALT '17 15 11  '$ ALKPHOS 76 100 89  BILITOT 1.1 0.7 0.3  PROT 4.5* 4.5* 4.8*  ALBUMIN 1.5* 1.4* 1.3*     CBG: Recent Labs  Lab 07/20/21 2346 07/21/21 0403 07/21/21 1609 07/22/21 0021 07/22/21 0345  GLUCAP 103* 103* 109* 117* 109*    Recent Results (from the past 240 hour(s))  C Difficile Quick Screen (NO PCR Reflex)     Status: None   Collection Time: 07/16/21  1:09 AM   Specimen:  STOOL  Result Value Ref Range Status   C Diff antigen NEGATIVE NEGATIVE Final   C Diff toxin NEGATIVE NEGATIVE Final   C Diff interpretation No C. difficile detected.  Final    Comment: Performed at [facility], [address] [place], [place] [address]  Blood culture (routine single)     Status: None   Collection Time: 07/18/21  8:35 AM   Specimen: Right Antecubital; Blood  Result Value Ref Range Status   Specimen Description RIGHT ANTECUBITAL  Final   Special Requests   Final    BOTTLES DRAWN AEROBIC AND ANAEROBIC Blood Culture adequate volume   Culture  Final    NO GROWTH 5 DAYS Performed at [facility], [address] [place], [place] [address]    Report Status 07/23/2021 FINAL  Final  Resp Panel by RT-PCR (Flu A&B, Covid) Nasopharyngeal Swab     Status: Abnormal   Collection Time: 07/18/21  9:18 AM   Specimen: Nasopharyngeal Swab; Nasopharyngeal(NP) swabs in vial transport medium  Result Value Ref Range Status   SARS Coronavirus 2 by RT PCR POSITIVE (A) NEGATIVE Final    Comment: RESULT CALLED TO, READ BACK BY AND VERIFIED WITH: OAKLY,B AT 1425 BY HUFFINES,S ON 07/18/21. (NOTE) SARS-CoV-2 target nucleic acids are DETECTED.  The SARS-CoV-2 RNA is generally detectable in upper respiratory specimens during the acute phase of infection. Positive results are indicative of the presence of the identified virus, but do not rule out bacterial infection or co-infection with other pathogens not detected by the test. Clinical correlation with patient history and other diagnostic information is necessary to determine patient infection status. The expected result is Negative.  Fact Sheet for Patients: EntrepreneurPulse.com.au  Fact Sheet for Healthcare Providers: IncredibleEmployment.be  This test is not yet approved or cleared by the [place] FDA and  has been authorized for detection and/or diagnosis of SARS-CoV-2 by FDA under an  Emergency Use Authorization (EUA).  This EUA will remain in effect (meaning this te st can be used) for the duration of  the COVID-19 declaration under Section 564(b)(1) of the Act, 21 U.S.C. section 360bbb-3(b)(1), unless the authorization is terminated or revoked sooner.     Influenza A by PCR NEGATIVE NEGATIVE Final   Influenza B by PCR NEGATIVE NEGATIVE Final    Comment: (NOTE) The Xpert Xpress SARS-CoV-2/FLU/RSV plus assay is intended as an aid in the diagnosis of influenza from Nasopharyngeal swab specimens and should not be used as a sole basis for treatment. Nasal washings and aspirates are unacceptable for Xpert Xpress SARS-CoV-2/FLU/RSV testing.  Fact Sheet for Patients: EntrepreneurPulse.com.au  Fact Sheet for Healthcare Providers: IncredibleEmployment.be  This test is not yet approved or cleared by the [place] FDA and has been authorized for detection and/or diagnosis of SARS-CoV-2 by FDA under an Emergency Use Authorization (EUA). This EUA will remain in effect (meaning this test can be used) for the duration of the COVID-19 declaration under Section 564(b)(1) of the Act, 21 U.S.C. section 360bbb-3(b)(1), unless the authorization is terminated or revoked.  Performed at [facility], [address] [place], [place] [address]   Blood Culture (routine x 2)     Status: None   Collection Time: 07/18/21 10:22 AM   Specimen: BLOOD  Result Value Ref Range Status   Specimen Description BLOOD RIGHT ANTECUBITAL  Final   Special Requests   Final    BOTTLES DRAWN AEROBIC AND ANAEROBIC Blood Culture adequate volume   Culture   Final    NO GROWTH 5 DAYS Performed at [facility], [address] [place], [place] [address]    Report Status 07/23/2021 FINAL  Final  MRSA Next Gen by PCR, Nasal     Status: None   Collection Time: 07/18/21  8:51 PM   Specimen: Nasal Mucosa; Nasal Swab  Result Value Ref Range Status   MRSA by PCR Next  Gen NOT DETECTED NOT DETECTED Final    Comment: (NOTE) The GeneXpert MRSA Assay (FDA approved for NASAL specimens only), is one component of a comprehensive MRSA colonization surveillance program. It is not intended to diagnose MRSA infection nor to guide or monitor treatment for MRSA  infections. Test performance is not FDA approved in patients less than 20 years old. Performed at [facility], [place] [address] [place], [place] [address]       Studies: No results found.    Bonnielee Haff, MD  [facility] 07/25/2021  If 7PM-7AM, please contact night-coverage

## 2021-07-25 NOTE — Progress Notes (Signed)
Nutrition Follow-up  DOCUMENTATION CODES:   Not applicable  INTERVENTION:  Continue TPN management per Pharmacy   NUTRITION DIAGNOSIS:   Inadequate oral intake related to altered GI function, acute illness as evidenced by NPO status.  ongoing  GOAL:   Patient will meet greater than or equal to 90% of their needs  Met with TPN  MONITOR:   Labs, Weight trends, Skin, I & O's, Other (Comment) (TPN)  REASON FOR ASSESSMENT:   Consult Assessment of nutrition requirement/status, New TPN/TNA  ASSESSMENT:   75 year old female who presented to the ED on 8/24 s/p CPR. Pt with recent admission from 8/09 to 8/23 with anemia from GI bleed secondary to cecal mass s/p R hemicolectomy with primary anastomosis on 07/06/21. 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 Pt also found to be positive for COVID-19 on admission. PMH of atrial fibrillation, CHF, HTN, lymphedema, mitral regurgitation. Pt admitted with septic shock with abdominal wall fluid collection and concern for necrotizing fasciitis.  Pt with colocutaneous fistula after R hemicolectomy at OSH. Midline wound was opened at bedside 8/24 and pt now has stool decompressing through CC fistula, currently has Eakin's pouch w/ suction to aid in collection of output. WOCN following. Per MD, sepsis appears resolved and pt has completed treatment for incidental COVID. Pt remains NPO and receiving TPN. PMT has been consulted to discuss [facility] with pt and family.   Admission weight: 114.9 kg Current weight: 124.3 kg  UOP: 1.5L x24 hours Stool: 734m x24 hours I/O: +[id number] admit  Per RN edema assessment, pt with mild pitting edema to BUE and deep pitting edema to BLE.   Labs and medications reviewed. Pt receiving protonix.   Diet Order:   Diet Order             Diet NPO time specified Except for: Sips with Meds, Ice Chips  Diet effective now                   EDUCATION NEEDS:   Not appropriate for  education at this time  Skin:  Skin Assessment: Skin Integrity Issues: Skin Integrity Issues:: Other (Comment) Other: pressure injury to coccyx, pressure injury to R thigh, Eakins pouch to abdomen, MASD to bilateral buttocks  Last BM:  8/30 70071mHeight:   Ht Readings from Last 1 Encounters:  07/18/21 5' 2"  (1.575 m)    Weight:   Wt Readings from Last 1 Encounters:  07/25/21 124.3 kg    Ideal Body Weight:  50 kg  BMI:  Body mass index is 50.12 kg/m.  Estimated Nutritional Needs:   Kcal:  [id number]:  140-160 grams  Fluid:  >/= 2.0 L    AmaLarkin InaS, RD, LDN (she/her/hers) RD pager number and weekend/on-call pager number located in [place]

## 2021-07-26 DIAGNOSIS — L03311 Cellulitis of abdominal wall: Secondary | ICD-10-CM | POA: Diagnosis not present

## 2021-07-26 DIAGNOSIS — K632 Fistula of intestine: Secondary | ICD-10-CM | POA: Diagnosis not present

## 2021-07-26 DIAGNOSIS — I4891 Unspecified atrial fibrillation: Secondary | ICD-10-CM | POA: Diagnosis not present

## 2021-07-26 DIAGNOSIS — U071 COVID-19: Secondary | ICD-10-CM | POA: Diagnosis not present

## 2021-07-26 DIAGNOSIS — C189 Malignant neoplasm of colon, unspecified: Secondary | ICD-10-CM | POA: Diagnosis not present

## 2021-07-26 DIAGNOSIS — D509 Iron deficiency anemia, unspecified: Secondary | ICD-10-CM | POA: Diagnosis not present

## 2021-07-26 DIAGNOSIS — A419 Sepsis, unspecified organism: Secondary | ICD-10-CM | POA: Diagnosis not present

## 2021-07-26 LAB — COMPREHENSIVE METABOLIC PANEL
ALT: 17 U/L (ref 0–44)
AST: 34 U/L (ref 15–41)
Albumin: 1.5 g/dL — ABNORMAL LOW (ref 3.5–5.0)
Alkaline Phosphatase: 111 U/L (ref 38–126)
Anion gap: 5 (ref 5–15)
BUN: 18 mg/dL (ref 8–23)
CO2: 24 mmol/L (ref 22–32)
Calcium: 8 mg/dL — ABNORMAL LOW (ref 8.9–10.3)
Chloride: 108 mmol/L (ref 98–111)
Creatinine, Ser: 0.56 mg/dL (ref 0.44–1.00)
GFR, Estimated: >60 mL/min (ref >60)
Glucose, Bld: 94 mg/dL (ref 70–99)
Potassium: 4.1 mmol/L (ref 3.5–5.1)
Sodium: 137 mmol/L (ref 135–145)
Total Bilirubin: 0.5 mg/dL (ref 0.3–1.2)
Total Protein: 5.8 g/dL — ABNORMAL LOW (ref 6.5–8.1)

## 2021-07-26 LAB — CBC
HCT: 24.4 % — ABNORMAL LOW (ref 36.0–46.0)
Hemoglobin: 7.2 g/dL — ABNORMAL LOW (ref 12.0–15.0)
MCH: 22.9 pg — ABNORMAL LOW (ref 26.0–34.0)
MCHC: 29.5 g/dL — ABNORMAL LOW (ref 30.0–36.0)
MCV: 77.7 fL — ABNORMAL LOW (ref 80.0–100.0)
Platelets: 225 10*3/uL (ref 150–400)
RBC: 3.14 MIL/uL — ABNORMAL LOW (ref 3.87–5.11)
RDW: 30.2 % — ABNORMAL HIGH (ref 11.5–15.5)
WBC: 13.1 10*3/uL — ABNORMAL HIGH (ref 4.0–10.5)
nRBC: 0.3 % — ABNORMAL HIGH (ref 0.0–0.2)

## 2021-07-26 LAB — PHOSPHORUS: Phosphorus: 3.3 mg/dL (ref 2.5–4.6)

## 2021-07-26 LAB — MAGNESIUM: Magnesium: 1.6 mg/dL — ABNORMAL LOW (ref 1.7–2.4)

## 2021-07-26 MED ORDER — DILTIAZEM HCL-DEXTROSE 125-5 MG/125ML-% IV SOLN (PREMIX)
5.0000 mg/h | INTRAVENOUS | Status: DC
  Administered 2021-07-26: 5 mg/h via INTRAVENOUS
  Administered 2021-07-26: 7.5 mg/h via INTRAVENOUS
  Administered 2021-07-27: 5 mg/h via INTRAVENOUS
  Filled 2021-07-26 (×3): qty 125

## 2021-07-26 MED ORDER — MAGNESIUM SULFATE 50 % IJ SOLN
3.0000 g | Freq: Once | INTRAVENOUS | Status: AC
  Administered 2021-07-26: 3 g via INTRAVENOUS
  Filled 2021-07-26: qty 6

## 2021-07-26 MED ORDER — HYDROMORPHONE HCL 1 MG/ML IJ SOLN
0.5000 mg | INTRAMUSCULAR | Status: DC | PRN
  Administered 2021-07-26 (×2): 0.5 mg via INTRAVENOUS
  Administered 2021-07-27 – 2021-08-02 (×26): 1 mg via INTRAVENOUS
  Administered 2021-08-03: 0.5 mg via INTRAVENOUS
  Administered 2021-08-03 – 2021-08-07 (×17): 1 mg via INTRAVENOUS
  Administered 2021-08-08: 0.5 mg via INTRAVENOUS
  Administered 2021-08-08 – 2021-08-09 (×3): 1 mg via INTRAVENOUS
  Administered 2021-08-09 (×2): 0.5 mg via INTRAVENOUS
  Administered 2021-08-10 – 2021-08-17 (×18): 1 mg via INTRAVENOUS
  Filled 2021-07-26 (×70): qty 1

## 2021-07-26 MED ORDER — SODIUM CHLORIDE 0.9 % IV BOLUS
500.0000 mL | Freq: Once | INTRAVENOUS | Status: AC
  Administered 2021-07-26: 500 mL via INTRAVENOUS

## 2021-07-26 MED ORDER — TRAVASOL 10 % IV SOLN
INTRAVENOUS | Status: DC
  Filled 2021-07-26: qty 1512

## 2021-07-26 NOTE — Progress Notes (Signed)
Upon entering pt room I see PICC line on the bed. PICC line is entirely out and dressing is still half attached to pt. I stopped all drips and capped lines and assessed site. No bleeding at this time. Site looks a little red. Vitals are WNLs and pt is breathing '@baseline'$ . IV team and pts RN are alerted. Will monitor.

## 2021-07-26 NOTE — Progress Notes (Addendum)
Pt in A-fib HR in 110s, frequently goes up to 120s and 130s. MEWS score yellow. Marlowe Sax, MD notified.  New orders placed. MD made aware of new onset of low grade fever. Per MD, we will continue to monitor at this time, since pt is getting antibiotic therapy.  No signs of acute distress.

## 2021-07-26 NOTE — Progress Notes (Signed)
PROGRESS NOTE  Lauren [name] [id number] DOB: 02-27-1946 DOA: 07/18/2021 PCP: Pcp, No   LOS: 8 days   Brief narrative: Patient is 75 years old female with past medical history of atrial fibrillation, combined heart failure, hypertension, lymphedema, recent admission to the hospital for GI bleed secondary to cecal mass status post right hemicolectomy on 07/06/2021 at outside hospital.  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  She was also found to be COVID positive.  Patient was then brought in hospital and received IV fluids vasopressors antibiotic.  CT scan of the abdomen showed the fistula extending from the bowel adjacent to the suture through an umbilical hernia into the abdominal wall with fluid collection in the anterior abdominal wall and gas tracking.  Patient was admitted to the ICU on 07/18/2021 and was started on vasopressors and antibiotic.  On 07/19/21 patient was started on TPN and was taken off of Levophed.  Subsequently, patient was considered stable for transfer out of ICU.   Assessment/Plan:  Septic shock  Patient seems to be stable from a sepsis standpoint.  Has been off of vasopressors.  Remains on Zosyn.  Cultures have been negative.    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  T3 N2 M0 status.  CT scan showing some fistula with abdominal wall collection.  Initially thought to be abscess but when the midline wound was opened stool was noted to be present. General surgery continues to follow. Patient is on bowel rest.  Remains on TPN.  Remains on Zosyn.  WBC noted to be higher today.  Noted to have low-grade fever overnight.  We will check procalcitonin level.  May need to repeat imaging studies if she  continues to have fevers or abdominal pain worsens. Electrolytes being addressed by pharmacy. Increase Dilaudid for better pain control.  Incidental COVID 19 positive. Completed remdesivir.  On room air.   Permanent atrial fibrillation Patient was on Xarelto as outpatient.  Currently on full dose Lovenox. Has been on metoprolol as intravenously on a scheduled basis however heart rate was poorly controlled overnight so she was transitioned to Cardizem infusion.  Blood pressure noted to be soft this morning.  Cut back on the dose of the infusion to 7.5 mg/h.  If this continues to be a problem we may need to switch her over to amiodarone.  May need to involve cardiology since patient does have systolic CHF.  Chronic systolic CHF Care everywhere was reviewed.  She had an echocardiogram in August 2022 at [facility] which showed EF to be 30%. Seems to be fairly euvolemic at this time.  Continue to monitor volume status closely.  Acute kidney injury Resolved.   Anemia, microcytic No signs of active bleeding.  Transfuse for hemoglobin less than 7.  Hemoglobin low but stable.  No overt bleeding noted.     Hyponatremia/hypokalemia/hypomagnesemia Sodium is stable.  Potassium, magnesium and phosphorus being addressed by pharmacy since patient is on TPN.  Goals of care Requested palliative care to see this patient to initiate goals of care conversation with patient and family.   DVT prophylaxis: On Lovenox therapeutic  Code Status: Full code  Family Communication:  None today.  spoke with the patient's brother on 8/28  Status is: Inpatient  Remains inpatient appropriate because: IV treatments appropriate  due to intensity of illness or inability to take PO, and Inpatient level of care appropriate due to severity of illness  Dispo: The patient is from: SNF              Anticipated d/c is to: SNF              Patient currently is not medically stable to d/c.   Difficult to place patient No    Consultants: General surgery PCCM  Procedures: PICC line placement  Anti-infectives:  Remdesivir- completed Zosyn IV   Subjective: Patient noted to be in discomfort.  Complains of abdominal pain.  No chest pain.  No shortness of breath.  No nausea vomiting has been noted.     Objective: Vitals:   07/26/21 0900 07/26/21 0905  BP: (!) 99/50 (!) 104/50  Pulse:  (!) 115  Resp:    Temp:    SpO2:      Intake/Output Summary (Last 24 hours) at 07/26/2021 1109 Last data filed at 07/26/2021 0416 Gross per 24 hour  Intake 2525.99 ml  Output 1800 ml  Net 725.99 ml    Filed Weights   07/24/21 0428 07/25/21 0307 07/26/21 0340  Weight: 124.3 kg 124.3 kg 126.8 kg   Body mass index is 51.13 kg/m.   Physical Exam:  General appearance: Awake alert.  In no distress Resp: Mildly tachypneic.  No use of accessory muscles.  Coarse breath sounds bilaterally.  No definite wheezing crackles or rhonchi. Cardio: S1-S2 is irregularly irregular, tachycardic.  No S3-S4.  No rubs murmurs or bruit GI: Abdomen is soft.  Eakin's pouch in place.  Rolon stool noted.  Hypoactive bowel sounds.  Remains tender.   Extremities: Noted to be able to move all her extremities Neurologic:  No focal neurological deficits.       Data Review: I have personally reviewed the following laboratory data and studies,  CBC: Recent Labs  Lab 07/21/21 0611 07/22/21 0323 07/23/21 0023 07/24/21 0249 07/26/21 0615  WBC 9.7 9.8 9.8 10.1 13.1*  HGB 7.7* 7.3* 7.2* 7.0* 7.2*  HCT 26.6* 25.0* 25.4* 24.4* 24.4*  MCV 76.0* 76.0* 77.4* 77.5* 77.7*  PLT 238 219 217 223 [id number]    Basic Metabolic Panel: Recent Labs  Lab 07/20/21 0250 07/21/21 0611 07/22/21 0323 07/23/21 0842 07/26/21 0615  NA 132* 134* 136 138 137  K 3.1* 3.9 4.3 4.6 4.1  CL 102 105 104 108 108  CO2 '23 25 26 27 24  '$ GLUCOSE 112* 110* 111* 97 94  BUN '15 14 13 18 18  '$ CREATININE 1.07* 0.85 0.66 0.52 0.56  CALCIUM 7.4* 7.5* 7.6* 7.6* 8.0*  MG  2.2 1.8 1.8 2.0 1.6*  PHOS 3.1 2.2* 2.2* 2.5 3.3    Liver Function Tests: Recent Labs  Lab 07/20/21 0250 07/23/21 0842 07/26/21 0615  AST 17 15 34  ALT '15 11 17  '$ ALKPHOS 100 89 111  BILITOT 0.7 0.3 0.5  PROT 4.5* 4.8* 5.8*  ALBUMIN 1.4* 1.3* 1.5*     CBG: Recent Labs  Lab 07/20/21 2346 07/21/21 0403 07/21/21 1609 07/22/21 0021 07/22/21 0345  GLUCAP 103* 103* 109* 117* 109*    Recent Results (from the past 240 hour(s))  Blood culture (routine single)     Status: None   Collection Time: 07/18/21  8:35 AM   Specimen: Right Antecubital; Blood  Result Value Ref Range Status   Specimen Description RIGHT ANTECUBITAL  Final   Special Requests   Final    BOTTLES DRAWN  AEROBIC AND ANAEROBIC Blood Culture adequate volume   Culture   Final    NO GROWTH 5 DAYS Performed at [facility], [address] [place], [place] [address]    Report Status 07/23/2021 FINAL  Final  Resp Panel by RT-PCR (Flu A&B, Covid) Nasopharyngeal Swab     Status: Abnormal   Collection Time: 07/18/21  9:18 AM   Specimen: Nasopharyngeal Swab; Nasopharyngeal(NP) swabs in vial transport medium  Result Value Ref Range Status   SARS Coronavirus 2 by RT PCR POSITIVE (A) NEGATIVE Final    Comment: RESULT CALLED TO, READ BACK BY AND VERIFIED WITH: OAKLY,B AT 1425 BY HUFFINES,S ON 07/18/21. (NOTE) SARS-CoV-2 target nucleic acids are DETECTED.  The SARS-CoV-2 RNA is generally detectable in upper respiratory specimens during the acute phase of infection. Positive results are indicative of the presence of the identified virus, but do not rule out bacterial infection or co-infection with other pathogens not detected by the test. Clinical correlation with patient history and other diagnostic information is necessary to determine patient infection status. The expected result is Negative.  Fact Sheet for Patients: EntrepreneurPulse.com.au  Fact Sheet for Healthcare  Providers: IncredibleEmployment.be  This test is not yet approved or cleared by the [place] FDA and  has been authorized for detection and/or diagnosis of SARS-CoV-2 by FDA under an Emergency Use Authorization (EUA).  This EUA will remain in effect (meaning this te st can be used) for the duration of  the COVID-19 declaration under Section 564(b)(1) of the Act, 21 U.S.C. section 360bbb-3(b)(1), unless the authorization is terminated or revoked sooner.     Influenza A by PCR NEGATIVE NEGATIVE Final   Influenza B by PCR NEGATIVE NEGATIVE Final    Comment: (NOTE) The Xpert Xpress SARS-CoV-2/FLU/RSV plus assay is intended as an aid in the diagnosis of influenza from Nasopharyngeal swab specimens and should not be used as a sole basis for treatment. Nasal washings and aspirates are unacceptable for Xpert Xpress SARS-CoV-2/FLU/RSV testing.  Fact Sheet for Patients: EntrepreneurPulse.com.au  Fact Sheet for Healthcare Providers: IncredibleEmployment.be  This test is not yet approved or cleared by the [place] FDA and has been authorized for detection and/or diagnosis of SARS-CoV-2 by FDA under an Emergency Use Authorization (EUA). This EUA will remain in effect (meaning this test can be used) for the duration of the COVID-19 declaration under Section 564(b)(1) of the Act, 21 U.S.C. section 360bbb-3(b)(1), unless the authorization is terminated or revoked.  Performed at [facility], [address], [place], [place] [address]   Blood Culture (routine x 2)     Status: None   Collection Time: 07/18/21 10:22 AM   Specimen: BLOOD  Result Value Ref Range Status   Specimen Description BLOOD RIGHT ANTECUBITAL  Final   Special Requests   Final    BOTTLES DRAWN AEROBIC AND ANAEROBIC Blood Culture adequate volume   Culture   Final    NO GROWTH 5 DAYS Performed at [facility], [address] [place], [place] [address]     Report Status 07/23/2021 FINAL  Final  MRSA Next Gen by PCR, Nasal     Status: None   Collection Time: 07/18/21  8:51 PM   Specimen: Nasal Mucosa; Nasal Swab  Result Value Ref Range Status   MRSA by PCR Next Gen NOT DETECTED NOT DETECTED Final    Comment: (NOTE) The GeneXpert MRSA Assay (FDA approved for NASAL specimens only), is one component of a comprehensive MRSA colonization surveillance program. It is not intended  to diagnose MRSA infection nor to guide or monitor treatment for MRSA infections. Test performance is not FDA approved in patients less than 32 years old. Performed at [facility], [place] [address], [place],  [address]       Studies: No results found.    Bonnielee Haff, MD  [facility] 07/26/2021  If 7PM-7AM, please contact night-coverage

## 2021-07-26 NOTE — Consult Note (Signed)
Palliative Care Consult Note                                  Date: 07/26/2021   Patient Name: Lauren [name]  DOB: 08/15/1946  MRN: [id number]  Age / Sex: 75 y.o., female  PCP: Pcp, No Referring Physician: Bonnielee Haff, MD  Reason for Consultation: Establishing goals of care  HPI/Patient Profile: Palliative Care consult requested for goals of care discussion in this 75 y.o. female  with past medical history of hypertension, combined CHF, atrial fibrillation (Xarelto), hypothyroidism, lymphedema, and obesity. She was admitted on 4/94/4967 from [facility] with complaints of nausea, abdominal pain, and hypotension. Patient was recently admitted for GI bleed secondary to cecal mass s/p right hemicolectomy (07/06/2021). During work-up found to be COVID positive. Patient requiring TPN. Has weaned off of vasopressors.    Past Medical History:  Diagnosis Date  . A-fib ([place])   . Acute cholecystitis 12/06/2012  . Atrial fibrillation with RVR ([place]) 12/06/2012  . Diverticulosis   . Hypothyroidism   . Obesity   . Pancreatitis, acute 12/04/2012  . Periumbilical hernia      Subjective:   This NP Osborne Oman reviewed medical records, received report from team, assessed the patient and then met at the patient's bedside with Lauren [name] and spoke with her brother Lauren [name]) via speaker phone to discuss diagnosis, prognosis, [place], EOL wishes disposition and options.  Patient is somnolent, easily awaken but does occasionally falls asleep during discussions. She is alert and oriented x3 to all questions. Denies pain or shortness of breath.    Concept of Palliative Care was introduced as specialized medical care for people and their families living with serious illness.  It focuses on providing relief from the symptoms and stress of a serious illness.  The goal is to improve quality of life for both the patient and the family. Values and goals of care  important to patient and family were attempted to be elicited.  I created space and opportunity for patient and family to explore state of health prior to admission, thoughts, and feelings. Lauren [name] shares she lived in the home alone prior to previous hospitalizations. She most recently (since surgery) has required assistance with some ADLs. States her appetite has decreased over the past 4-6 weeks.   When she was in the home her friend would assist with appointments and errands. Patient is sad expressing however her friend's husband is on hospice and friend is unable to provider her with support.   We discussed Her current illness and what it means in the larger context of Her on-going co-morbidities. Natural disease trajectory and expectations were discussed.  Lauren [name] and her brother verbalized understanding of current illness and co-morbidities. Patient states she is remaining hopeful for some improvement but does voice concerns of her recent health challenges and multiple hospitalizations.   Brother verbalizes similar concerns. He shares patient lives in the home alone and if she is unable to provide care independently he worries bout what that will look like for her long-term. He shares he is disabled in a wheelchair and does not live near patient (30 miles away). States they have no other family that would be able to support her when she does reach the point of returning home given her medical needs.   I discussed the importance of continued conversation with family and their medical providers regarding overall plan of care and  treatment options, ensuring decisions are within the context of the patients values and GOCs.  Questions and concerns were addressed.  Patient and brother was encouraged to call with questions or concerns.  PMT will continue to support holistically as needed.  Life Review: Patient is widowed. She has 1 daughter who lives out of state and is estranged. She has not spoken  with her in over 6 years. Lives alone. 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   Objective:   Primary Diagnoses: Present on Admission: . Colon carcinoma ([place]) . Enterocutaneous fistula . Cellulitis . AKI (acute kidney injury) ([place]) . Hyponatremia   Scheduled Meds: . Chlorhexidine Gluconate Cloth  6 each Topical Daily  . enoxaparin (LOVENOX) injection  120 mg Subcutaneous Q12H  . levothyroxine  50 mcg Intravenous Daily  . mouth rinse  15 mL Mouth Rinse BID  . pantoprazole (PROTONIX) IV  40 mg Intravenous Daily  . sodium chloride flush  10-40 mL Intracatheter Q12H    Continuous Infusions: . diltiazem (CARDIZEM) infusion 7.5 mg/hr (07/26/21 0904)  . magnesium sulfate bolus IVPB 3 g (07/26/21 1106)  . methocarbamol (ROBAXIN) IV 500 mg (07/25/21 2343)  . piperacillin-tazobactam (ZOSYN)  IV 3.375 g (07/26/21 5300)  . TPN ADULT (ION) 105 mL/hr at 07/25/21 1723  . TPN ADULT (ION)      PRN Meds: diphenhydrAMINE, HYDROmorphone (DILAUDID) injection **OR** [DISCONTINUED]  HYDROmorphone (DILAUDID) injection, methocarbamol (ROBAXIN) IV, ondansetron (ZOFRAN) IV, sodium chloride flush  Allergies  Allergen Reactions  . Codeine Itching  . Compazine [Prochlorperazine Edisylate]   . Prednisone Nausea And Vomiting  . Tetanus Toxoids Other (See Comments)    Knot like "hen egg" came up   . Vicodin [Hydrocodone-Acetaminophen] Itching    Review of Systems  Gastrointestinal:  Positive for abdominal pain.  Neurological:  Positive for weakness.  Unless otherwise noted, a complete review of systems is negative.  Physical Exam General: NAD, frail ill appearing Cardiovascular: Tachycardic, Irregularly irregular  Pulmonary: course bilaterally  Abdomen: soft, tender, hypoactive bowel sounds, Eakins  Pouch  Extremities: no edema, no joint deformities Skin: no rashes, warm and dry Neurological: AAO x3, somnolent   Vital Signs:  BP (!) 132/44 (BP Location: Right Arm)   Pulse (!) 112    Temp 99.3 F (37.4 C) (Oral)   Resp (!) 24   Ht 5' 2"  (1.575 m)   Wt 126.8 kg   LMP 09/13/2016   SpO2 93%   BMI 51.13 kg/m  Pain Scale: PAINAD   Pain Score: 8   SpO2: SpO2: 93 % O2 Device:SpO2: 93 % O2 Flow Rate: .   IO: Intake/output summary:  Intake/Output Summary (Last 24 hours) at 07/26/2021 1217 Last data filed at 07/26/2021 1117 Gross per 24 hour  Intake 2525.99 ml  Output 2050 ml  Net 475.99 ml    LBM: Last BM Date: 07/24/21 Baseline Weight: Weight: 114.9 kg Most recent weight: Weight: 126.8 kg      Palliative Assessment/Data: PPS 20-30%   Advanced Care Planning:   Primary Decision Maker: NEXT OF KIN-Brother Lauren [name])   Code Status/Advance Care Planning: DNR  A discussion was had today regarding advanced directives. Concepts specific to code status, artifical feeding and hydration, continued IV antibiotics and rehospitalization was had.    I discussed at length with patient and her brother regarding her full status with consideration of current illness and co-morbidities. We discussed what an emergent situation would look like for patient. Lauren [name] and Lauren [name] both mutually verbalized wishes for DNR/DNI.  Patient states she would wish to be allowed a natural death without life-sustaining interventions. Education provided on DNR/DNI. Brother and patient verbalized understanding confirming wishes.   Patient and brother are clear in expressed wishes to continue to treat the treatable. They are remaining hopeful for stability/improvement.    Assessment & Plan:   SUMMARY OF RECOMMENDATIONS   DNR/DNI-as requested and confirmed by patient and her brother  Continue with current plan of care, treat the treatable Patient and her brother are clear in expressed wishes to treat the treatable allowing patient every opportunity to stabilize/improve. Patient lives alone and they are hopeful for continued SNF rehab and eventual return home, however brother is realistic  in his understanding.  Patient has no family but him and he is unable to care for patient as he is limited due to health and is in a wheelchair.  Recommendations for outpatient palliative at minimum on discharge for ongoing support.  PMT will continue to support and follow. Please call team line with urgent needs.  Palliative Prophylaxis:  Bowel Regimen, Frequent Pain Assessment, Oral Care, Palliative Wound Care, and Turn Reposition  Additional Recommendations (Limitations, Scope, Preferences): Treat the treatable, DNR  Psycho-social/Spiritual:  Desire for further Chaplaincy support: no Additional Recommendations:  Ongoing goals of care discussions   Prognosis: Guarded   Discharge Planning:  To Be Determined   Patient and her brother, Lauren [name] expressed understanding and was in agreement with this plan.   Time In: 1200 Time Out: 1255 Time Total: 55 min.   Visit consisted of counseling and education dealing with the complex and emotionally intense issues of symptom management and palliative care in the setting of serious and potentially life-threatening illness.Greater than 50%  of this time was spent counseling and coordinating care related to the above assessment and plan.  Signed by:  Alda Lea, AGPCNP-BC Palliative Medicine Team  Phone: [phone] Pager: [phone] Amion: Bjorn Pippin   Thank you for allowing the Palliative Medicine Team to assist in the care of this patient. Please utilize secure chat with additional questions, if there is no response within 30 minutes please call the above phone number. Palliative Medicine Team providers are available by phone from 7am to 5pm daily and can be reached through the team cell phone.  Should this patient require assistance outside of these hours, please call the patient's attending physician.

## 2021-07-26 NOTE — Progress Notes (Signed)
   07/26/21 1752  Assess: MEWS Score  Temp 98.3 F (36.8 C)  BP (!) 96/54  Pulse Rate (!) 102  ECG Heart Rate (!) 114  Resp (!) 21  Level of Consciousness Alert  SpO2 96 %  O2 Device Room Air  Assess: MEWS Score  MEWS Temp 0  MEWS Systolic 1  MEWS Pulse 2  MEWS RR 1  MEWS LOC 0  MEWS Score 4  MEWS Score Color Red  Assess: if the MEWS score is Yellow or Red  Were vital signs taken at a resting state? Yes  Focused Assessment No change from prior assessment  Early Detection of Sepsis Score *See Row Information* High  MEWS guidelines implemented *See Row Information* Yes  Treat  MEWS Interventions Escalated (See documentation below)  Complains of Anxiety  Take Vital Signs  Increase Vital Sign Frequency  Red: Q 1hr X 4 then Q 4hr X 4, if remains red, continue Q 4hrs  Escalate  MEWS: Escalate Red: discuss with charge nurse/RN and provider, consider discussing with RRT  Notify: Charge Nurse/RN  Name of Charge Nurse/RN Notified Josh RN  Date Charge Nurse/RN Notified 07/26/21  Time Charge Nurse/RN Notified [id number]  Notify: Provider  Provider Name/Title Curly Rim MD  Date Provider Notified 07/26/21  Time Provider Notified 1755  Notification Type Page  Notification Reason Other (Comment) (RED MEWS)  Provider response See new orders  Date of Provider Response 07/26/21  Time of Provider Response [phone]

## 2021-07-26 NOTE — Progress Notes (Signed)
PHARMACY - TOTAL PARENTERAL NUTRITION CONSULT NOTE  Indication: Fistula  Patient Measurements: Height: '5\' 2"'$  (157.5 cm) Weight: 126.8 kg (279 lb 8.7 oz) IBW/kg (Calculated) : 50.1 TPN AdjBW (KG): 66.6 Body mass index is 51.13 kg/m.  Assessment:  40 YOF with recent R-hemicolectomy on 8/12 in the setting of a cecal mass - consistent with R-colon metastatic adenocarcinoma. 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 Pharmacy consulted to manage TPN for nutritional support in the setting of a colocutaneous fistula.    Glucose / Insulin: CBGs <120, off SSI 07/22/21 Electrolytes: all WNL except Mag at 1.6 (goal >/= 2 for Afib) Renal:  SCr < 1, BUN WNL Hepatic: LFTs / tibili / TG WNL, albumin 1.3  Intake / Output; MIVF:  UOP 2864m, ostomy 050m  GI Imaging: 8/24 Abd CT: colocutaneous fistula, 7.4cm abd wall abscess, soft tissue edema, cellulitis vs nec fasciitis 8/25 Abd xray: diffuse abd wall emphysema, nonobstructive bowel gas pattern GI Surgeries / Procedures:  8/12 (last admit): R-hemicolectomy  Central access: PICC replaced 07/19/21 TPN start date: 07/19/21  Nutritional Goals (RD assessment on 8/25): 2300-2500 kcal, 140-160g AA, fluid >/= 2L per day  Current Nutrition:  TPN  Plan:  Continue TPN at goal rate of 105 mL/hr, providing 151g AA and 2373 kCal meeting 100% of estimated needs Electrolytes in TPN: Na 125 mEq/L, K 3075mL, Ca 4mE11m, increase Mg to 11mE44m Phos 22mmo64m Cl:Ac 1:1 Add standard MVI and trace elements to TPN Mag sulfate 3gm IV x 1 Standard TPN labs Mon/Thurs, Mag in AM  Tritia Endo D. Traniyah Hallett, Mina MarblemD, BCPS, BCCCP [id number], 8:55 AM

## 2021-07-26 NOTE — Progress Notes (Signed)
Cardizem drip started, 30 minutes titration/rate unchanged. SBP 104<110. HR in 110s-120s. Cardizem continue to infuse at initial rate of 5 mg/hr. Will continue to monitor pt.

## 2021-07-26 NOTE — Progress Notes (Signed)
   07/26/21 0000  Assess: MEWS Score  Temp 98.1 F (36.7 C)  Pulse Rate (!) 106  ECG Heart Rate (!) 129  Resp 19  SpO2 96 %  Assess: MEWS Score  MEWS Temp 0  MEWS Systolic 0  MEWS Pulse 2  MEWS RR 0  MEWS LOC 0  MEWS Score 2  MEWS Score Color Yellow  Assess: if the MEWS score is Yellow or Red  Were vital signs taken at a resting state? Yes  Focused Assessment No change from prior assessment  Early Detection of Sepsis Score *See Row Information* Low  MEWS guidelines implemented *See Row Information* No, previously yellow, continue vital signs every 4 hours  Notify: Charge Nurse/RN  Name of Charge Nurse/RN Notified Dantonio C, RN  Date Charge Nurse/RN Notified 07/26/21  Time Charge Nurse/RN Notified 0032  Notify: Provider  Provider Name/Title Rathore, MD  Date Provider Notified 07/26/21  Time Provider Notified 0005  Notification Type Page  Notification Reason Other (Comment) (HR elevated more than previously)  Provider response See new orders  Date of Provider Response 07/26/21  Time of Provider Response 0030  No acute distress noted. New orders placed by provider. Will continue to monitor pt closely.

## 2021-07-27 ENCOUNTER — Inpatient Hospital Stay (HOSPITAL_COMMUNITY): Payer: Medicare HMO

## 2021-07-27 ENCOUNTER — Encounter (HOSPITAL_COMMUNITY): Payer: Self-pay | Admitting: Pulmonary Disease

## 2021-07-27 ENCOUNTER — Inpatient Hospital Stay: Payer: Self-pay

## 2021-07-27 DIAGNOSIS — K632 Fistula of intestine: Secondary | ICD-10-CM | POA: Diagnosis not present

## 2021-07-27 DIAGNOSIS — I4891 Unspecified atrial fibrillation: Secondary | ICD-10-CM | POA: Diagnosis not present

## 2021-07-27 DIAGNOSIS — R410 Disorientation, unspecified: Secondary | ICD-10-CM

## 2021-07-27 DIAGNOSIS — D509 Iron deficiency anemia, unspecified: Secondary | ICD-10-CM | POA: Diagnosis not present

## 2021-07-27 DIAGNOSIS — R6521 Severe sepsis with septic shock: Secondary | ICD-10-CM | POA: Diagnosis not present

## 2021-07-27 DIAGNOSIS — A419 Sepsis, unspecified organism: Secondary | ICD-10-CM | POA: Diagnosis not present

## 2021-07-27 DIAGNOSIS — I5022 Chronic systolic (congestive) heart failure: Secondary | ICD-10-CM | POA: Diagnosis not present

## 2021-07-27 DIAGNOSIS — I4821 Permanent atrial fibrillation: Secondary | ICD-10-CM | POA: Diagnosis not present

## 2021-07-27 LAB — CBC
HCT: 30.5 % — ABNORMAL LOW (ref 36.0–46.0)
Hemoglobin: 8.7 g/dL — ABNORMAL LOW (ref 12.0–15.0)
MCH: 22.7 pg — ABNORMAL LOW (ref 26.0–34.0)
MCHC: 28.5 g/dL — ABNORMAL LOW (ref 30.0–36.0)
MCV: 79.6 fL — ABNORMAL LOW (ref 80.0–100.0)
Platelets: 144 10*3/uL — ABNORMAL LOW (ref 150–400)
RBC: 3.83 MIL/uL — ABNORMAL LOW (ref 3.87–5.11)
RDW: 31.1 % — ABNORMAL HIGH (ref 11.5–15.5)
WBC: 9.7 10*3/uL (ref 4.0–10.5)
nRBC: 0.2 % (ref 0.0–0.2)

## 2021-07-27 LAB — PROCALCITONIN: Procalcitonin: 0.29 ng/mL

## 2021-07-27 LAB — MAGNESIUM: Magnesium: 1.9 mg/dL (ref 1.7–2.4)

## 2021-07-27 MED ORDER — SODIUM CHLORIDE 0.9 % IV BOLUS
500.0000 mL | Freq: Once | INTRAVENOUS | Status: AC
  Administered 2021-07-27: 500 mL via INTRAVENOUS

## 2021-07-27 MED ORDER — TRAVASOL 10 % IV SOLN
INTRAVENOUS | Status: AC
  Filled 2021-07-27: qty 1512

## 2021-07-27 MED ORDER — DEXTROSE 10 % IV SOLN: INTRAVENOUS | Status: AC

## 2021-07-27 MED ORDER — HALOPERIDOL LACTATE 5 MG/ML IJ SOLN
1.0000 mg | Freq: Once | INTRAMUSCULAR | Status: AC
  Administered 2021-07-27: 1 mg via INTRAVENOUS
  Filled 2021-07-27: qty 1

## 2021-07-27 MED ORDER — DIGOXIN 0.25 MG/ML IJ SOLN
0.5000 mg | Freq: Once | INTRAMUSCULAR | Status: AC
  Administered 2021-07-27: 0.5 mg via INTRAVENOUS
  Filled 2021-07-27: qty 2

## 2021-07-27 MED ORDER — SODIUM CHLORIDE 0.9% FLUSH
10.0000 mL | INTRAVENOUS | Status: DC | PRN
  Administered 2021-08-07 – 2021-08-09 (×2): 10 mL

## 2021-07-27 MED ORDER — HALOPERIDOL LACTATE 5 MG/ML IJ SOLN
0.5000 mg | Freq: Four times a day (QID) | INTRAMUSCULAR | Status: DC | PRN
  Administered 2021-07-27 – 2021-08-08 (×11): 0.5 mg via INTRAVENOUS
  Filled 2021-07-27 (×12): qty 1

## 2021-07-27 MED ORDER — DIGOXIN 0.25 MG/ML IJ SOLN
0.2500 mg | Freq: Once | INTRAMUSCULAR | Status: AC
  Administered 2021-07-27: 0.25 mg via INTRAVENOUS
  Filled 2021-07-27: qty 1

## 2021-07-27 NOTE — Progress Notes (Signed)
Cardizem drip restarted at '5mg'$ /hr. Recheck BP 98/51 and HR 128 at 1021. MD made aware and said not to titrate up.

## 2021-07-27 NOTE — Progress Notes (Signed)
Subjective: She states she overall feels better today. She repeatedly denies abdominal pain at time of my exam but does have some achy pain in her left groin area she thinks may be related to her hip. She really wants to eat and drink and is asking to go home. We discussed the possibility of a repeat CT scan in the coming days and she refuses one at this time  No stool output from eakins pouch yesterday. 175 ml in cannister this am  Objective: Vital signs in last 24 hours: Temp:  [98.1 F (36.7 C)-99.3 F (37.4 C)] 98.7 F (37.1 C) (09/02 0617) Pulse Rate:  [96-115] 100 (09/02 0617) Resp:  [19-28] 19 (09/02 0617) BP: (93-132)/(44-69) 99/69 (09/02 0617) SpO2:  [93 %-98 %] 96 % (09/02 0617) Weight:  [123.7 kg] 123.7 kg (09/01 1333) Last BM Date: 07/26/21  Intake/Output from previous day: 09/01 0701 - 09/02 0700 In: -  Out: 1150 [Urine:1150] Intake/Output this shift: No intake/output data recorded.  PE: Gen:  Alert, NAD, pleasant Card:  Reg rate Pulm: Normal rate and effort Abd: Soft, distension difficult to assess given body habitus, very mild generalized tenderness greater on the right - no rebound or guarding, midline wound covered with eakin's pouch and suction with stool in pouch and cannister. Msk: MAE's Skin: no rashes noted, warm and dry  Lab Results:  Recent Labs    07/26/21 0615  WBC 13.1*  HGB 7.2*  HCT 24.4*  PLT 225    BMET Recent Labs    07/26/21 0615  NA 137  K 4.1  CL 108  CO2 24  GLUCOSE 94  BUN 18  CREATININE 0.56  CALCIUM 8.0*    PT/INR No results for input(s): LABPROT, INR in the last 72 hours. CMP     Component Value Date/Time   NA 137 07/26/2021 0615   K 4.1 07/26/2021 0615   CL 108 07/26/2021 0615   CO2 24 07/26/2021 0615   GLUCOSE 94 07/26/2021 0615   BUN 18 07/26/2021 0615   CREATININE 0.56 07/26/2021 0615   CALCIUM 8.0 (L) 07/26/2021 0615   PROT 5.8 (L) 07/26/2021 0615   ALBUMIN 1.5 (L) 07/26/2021 0615   AST 34  07/26/2021 0615   ALT 17 07/26/2021 0615   ALKPHOS 111 07/26/2021 0615   BILITOT 0.5 07/26/2021 0615   GFRNONAA >60 07/26/2021 0615   GFRAA >90 03/28/2014 0435   Lipase     Component Value Date/Time   LIPASE 25 03/27/2014 0600    Studies/Results: No results found.  Anti-infectives: Anti-infectives (From admission, onward)    Start     Dose/Rate Route Frequency Ordered Stop   07/20/21 1000  remdesivir 100 mg in sodium chloride 0.9 % 100 mL IVPB       See Hyperspace for full Linked Orders Report.   100 mg 200 mL/hr over 30 Minutes Intravenous Daily 07/19/21 1040 07/21/21 1148   07/20/21 0200  vancomycin (VANCOREADY) IVPB 750 mg/150 mL  Status:  Discontinued        750 mg 150 mL/hr over 60 Minutes Intravenous Every 24 hours 07/19/21 1036 07/19/21 1449   07/19/21 2300  anidulafungin (ERAXIS) 100 mg in sodium chloride 0.9 % 100 mL IVPB  Status:  Discontinued        100 mg 78 mL/hr over 100 Minutes Intravenous Every 24 hours 07/18/21 2107 07/19/21 1449   07/19/21 1600  vancomycin (VANCOREADY) IVPB 750 mg/150 mL  Status:  Discontinued  750 mg 150 mL/hr over 60 Minutes Intravenous Every 24 hours 07/19/21 0919 07/19/21 1016   07/19/21 1600  vancomycin (VANCOREADY) IVPB 750 mg/150 mL  Status:  Discontinued        750 mg 150 mL/hr over 60 Minutes Intravenous Every 24 hours 07/19/21 1016 07/19/21 1036   07/19/21 1400  piperacillin-tazobactam (ZOSYN) IVPB 3.375 g        3.375 g 12.5 mL/hr over 240 Minutes Intravenous Every 8 hours 07/19/21 1016     07/19/21 1130  remdesivir 200 mg in sodium chloride 0.9% 250 mL IVPB       See Hyperspace for full Linked Orders Report.   200 mg 580 mL/hr over 30 Minutes Intravenous Once 07/19/21 1040 07/19/21 1241   07/19/21 0100  metroNIDAZOLE (FLAGYL) IVPB 500 mg  Status:  Discontinued        500 mg 100 mL/hr over 60 Minutes Intravenous Every 12 hours 07/18/21 1203 07/18/21 1534   07/18/21 2200  ceFEPIme (MAXIPIME) 2 g in sodium chloride 0.9 %  100 mL IVPB  Status:  Discontinued        2 g 200 mL/hr over 30 Minutes Intravenous Every 12 hours 07/18/21 1156 07/18/21 1203   07/18/21 2200  meropenem (MERREM) 2 g in sodium chloride 0.9 % 100 mL IVPB  Status:  Discontinued        2 g 200 mL/hr over 30 Minutes Intravenous Every 12 hours 07/18/21 2104 07/19/21 1011   07/18/21 2200  anidulafungin (ERAXIS) 200 mg in sodium chloride 0.9 % 200 mL IVPB        200 mg 78 mL/hr over 200 Minutes Intravenous  Once 07/18/21 2108 07/19/21 0419   07/18/21 1800  clindamycin (CLEOCIN) IVPB 900 mg  Status:  Discontinued        900 mg 100 mL/hr over 30 Minutes Intravenous Every 8 hours 07/18/21 1534 07/18/21 2103   07/18/21 1600  vancomycin (VANCOREADY) IVPB 2000 mg/400 mL  Status:  Discontinued        2,000 mg 200 mL/hr over 120 Minutes Intravenous Every 24 hours 07/18/21 1544 07/19/21 0919   07/18/21 1545  piperacillin-tazobactam (ZOSYN) IVPB 3.375 g  Status:  Discontinued        3.375 g 12.5 mL/hr over 240 Minutes Intravenous Every 8 hours 07/18/21 1544 07/18/21 2104   07/18/21 1300  cefTRIAXone (ROCEPHIN) 2 g in sodium chloride 0.9 % 100 mL IVPB  Status:  Discontinued        2 g 200 mL/hr over 30 Minutes Intravenous Every 24 hours 07/18/21 1203 07/18/21 1628   07/18/21 1200  ceFEPIme (MAXIPIME) 2 g in sodium chloride 0.9 % 100 mL IVPB  Status:  Discontinued        2 g 200 mL/hr over 30 Minutes Intravenous Every 12 hours 07/18/21 1155 07/18/21 1156   07/18/21 0930  ceFEPIme (MAXIPIME) 2 g in sodium chloride 0.9 % 100 mL IVPB        2 g 200 mL/hr over 30 Minutes Intravenous  Once 07/18/21 0918 07/18/21 1135   07/18/21 0930  metroNIDAZOLE (FLAGYL) IVPB 500 mg  Status:  Discontinued        500 mg 100 mL/hr over 60 Minutes Intravenous  Once 07/18/21 [id number] 07/18/21 1534        Assessment/Plan 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 Arnoldo Morale at bedside 8/24 and  now has stool decompressing through CC fistula  - Currently has eakin's pouch with suction to aid in collection of output. WOCN following - Keep NPO. TPN and monitor strict I/O (please document stool output so can track if this is low or high output fistula). Output 700 ml 8/30-8/31. Per nursing report none yesterday. 175 ml in cannister this am - interval increase in WBC yesterday 10.1 > 13.1 and Tmax 100.79F 8/31. Continue to monitor and could consider repeat imaging in the future pending trend but she is refusing imaging at this time - our team will be available as needed this weekend and see again Monday or Tuesday   FEN - NPO. TPN. IVF per primary VTE - SCDs, lovenox ID - On Zosyn 8/24 >>. Afebrile. WBC 26.4 > 12.8 > 9.7 >9.8>10.1>13.1   COVID + Sepsis - off pressors, improved. Now on [facility] service.  A. Fib on Xarelto at home - was on heparin gtt and now on lovenox ABL anemia - FOBT positive 8/24. Monitor. Hgb 7.2 (7.0) Hypothyroidism AKI - resolved Severe protein calorie malnutrition - albumin 1.4. Pre-alb <5. On TNA   LOS: 9 days    Winferd Humphrey , [facility] 07/27/2021, 8:11 AM Please see Amion for pager number during day hours 7:00am-4:30pm

## 2021-07-27 NOTE — Progress Notes (Signed)
   Daily Progress Note   Patient Name: Lauren [name]       Date: 07/27/2021 DOB: [birth year]  Age: 75 y.o. MRN#: [id number] Attending Physician: Bonnielee Haff, MD Primary Care Physician: Pcp, No Admit Date: 07/18/2021  Reason for Consultation/Follow-up: Establishing goals of care  Subjective: Chart Reviewed. Updates Received. Patient Assessed.   Patient is somnolent. When awaken appears oriented to self only which is different compared to yesterday's discussions. Does not stay awake to follow commands. Does ask "am I going home!" Advised she is not medically stable to go home and she also is not safe to return home alone.   I called and spoke at length with her brother. Updates provided. He verbalized understanding and confirms DNR/DNI. Discussed if patient continues to show signs of decline or no meaningful recovery focusing on her comfort and allowing her a natural death would be advisable. Michael verbalizes understanding. He would like to allow more time and take things day by day. He states goal is to continue with all treatment as recommended as he is remaining hopeful for some stability/improvement.    All questions answered and support provided.   Length of Stay: 9 days  Vital Signs: BP (!) 96/42   Pulse (!) 112   Temp 98.7 F (37.1 C) (Axillary)   Resp 19   Ht '5\' 2"'$  (1.575 m)   Wt 123.7 kg   LMP 09/13/2016   SpO2 95%   BMI 49.88 kg/m  SpO2: SpO2: 95 % O2 Device: O2 Device: Room Air O2 Flow Rate:    Physical Exam: Appears agitated, confused, ill appearing  Tachycardic, irregularly irregular  Alert to self only                Palliative Care Assessment & Plan  HPI: Palliative Care consult requested for goals of care discussion in this 75 y.o. female  with past medical history of hypertension, combined CHF, atrial fibrillation (Xarelto), hypothyroidism, lymphedema, and obesity. She was admitted on [id number] from [facility] with complaints of nausea, abdominal pain,  and hypotension. Patient was recently admitted for GI bleed secondary to cecal mass s/p right hemicolectomy (07/06/2021). During work-up found to be COVID positive. Patient requiring TPN. Has weaned off of vasopressors.     Code Status: DNR  Goals of Care/Recommendations: Spoke at length with brother and provided updates. Discussed best case and worst case scenario in the event of no improvement/further decline. Request to continue to treat the treatable and allow patient every opportunity to show some improvement/stability.   Prognosis: Guarded   Discharge Planning: To Be Determined  Thank you for allowing the Palliative Medicine Team to assist in the care of this patient.  Time Total: 45 min.   Visit consisted of counseling and education dealing with the complex and emotionally intense issues of symptom management and palliative care in the setting of serious and potentially life-threatening illness.Greater than 50%  of this time was spent counseling and coordinating care related to the above assessment and plan.  Alda Lea, AGPCNP-BC  Palliative Medicine Team [phone]

## 2021-07-27 NOTE — Consult Note (Addendum)
Cardiology Consultation:   Patient ID: [name] MRN: [id number]; DOB: Apr 20, 1946  Admit date: 07/18/2021 Date of Consult: 07/27/2021  PCP:  [name], No   [facility] Providers Cardiologist:  Carlyle Dolly, MD        Patient Profile:   Lauren [name] is a 75 y.o. female with a hx of permanent atrial fibrillation, chronic combined CHF (new cardiomyopathy diagnosed 06/2021 @ [facility]), recently diagnosed metastatic colon CA with profound anemia, lymphedema, HTN, pancreatitis, hypothyroidism, morbid obesity, diverticulosis who is being seen 07/27/2021 for the evaluation of atrial fibrillation at the request of Dr. Maryland Pink.  History of Present Illness:   Lauren [name] has been followed by Dr. Harl Bowie for lower extremity edema (felt due to lymphedema + chronic diastolic CHF) and atrial fib. She had a remote echo 2014 showing borderline LVH, normal EF, mild LAE. She was diagnosed with atrial fib in 08/2016 at [facility]. This has been managed with rate control since that time as she did not wish to pursue DCCV. Subsequent EKGs since that time have all shown atrial fib (last SR EKG in 2015). She has had prior outpatient issues with low BP and fatigue requiring cessation of diltiazem and metoprolol. Her last in-person OV was 2019. From last telehealth OV 02/2020, event monitor was planned to assess HR control but do not see patient completed this. There also appear to be prior dosing issues with her Xarelto compliance where she previously self-decreased this to every other day due to bruising.   She was recently admitted to [facility] early August with constipation and painful defecation. She was found to have profound anemia Hgb 5.6 and cecal mass on CT. She required blood transfusion. She had AF RVR during that admission. 2D echo showed new severe LV dysfunction EF 30-%, mild-moderate MR, severely dilated LA. She had mild troponin elevation [id number]. She declined further colonoscopy and left AMA. She  was then admitted 8/9-8/23/22 to [facility] with continued constipation/rectal pain. She underwent colonoscopy showing partially obstructing cecal mass and subsequently underwent R hemicolectomy 07/06/21. Her pathology report indicates adenocarcinoma of the colon, metastatic to 1 of 22 lymph nodes. She has not yet had additional oncologic evaluation. Hospital course was notable for post-op nausea, vomiting and postop ileus, situational anxiety interspersed with somnolence, and electrolyte abnormalities. She had again had atrial fib RVR with management challenging due to baseline soft BP. She was seen by cardiology locums who added digoxin and consolidated her metoprolol to Toprol. Cardiomyopathy was felt due to inadequate ventricular rate control. Her blood pressure would not tolerate additional GDMT. She was discharged to SNF.   She presented to the [facility] ED 07/18/21 with hypotension and septic shock. 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 She was started on antibiotics, fluid, pressors. She was also found to be Covid positive, felt to be incidental, treated with remdesivir. Labs were notable for multiple abnormalites including evere leukocytosis of 33k on arrival with lactic acidosis, hyponatremia, hypokalemia, hypocalcemia, hypophosphatemia, severe hypoalbuminemia (nadir 1.3) with prealbumin <5.0, continued significant microcytic anemia down (8.7 today), + FOBT. She has been followed by general surgery and internal medicine team. She has had issues with acute delirium and lethargy. She has required TPN.  She has been seen by the palliative care team and has chosen DNR/DNI to  continue with current plan of care and treat the treatable.  Cardiology asked to see ongoing issues with rate control. She was getting IV metoprolol earlier this admission but HR remained poorly controlled so she was subsequently changed to IV diltiazem yesterday. Her HR remains 110s-160s and BPs remain soft in the 90s-low 100s as before. She has not been on digoxin this admission (not on DC med list from last admission). She is currently on full dose Lovenox for anticoagulation. She required Haldol this morning due to agitation. She is currently lethargic and oriented to self only. Does not stay awake long to answer any other questions correctly - when she does, it is mumbling.  Past Medical History:  Diagnosis Date   Acute cholecystitis 12/06/2012   Chronic combined systolic and diastolic CHF (congestive heart failure) (HCC)    Colon cancer (HCC)    Diverticulosis    Hypotension    BP runs soft   Hypothyroidism    Lymphedema    Morbid obesity ([place])    Obesity    Pancreatitis, acute 0000000   Periumbilical hernia    Permanent atrial fibrillation [facility])     Past Surgical History:  Procedure Laterality Date   BIOPSY  07/05/2021   Procedure: BIOPSY;  Surgeon: [name], DO;  [facility];  Service: Endoscopy;;   CHOLECYSTECTOMY N/A 03/25/2014   Procedure: LAPAROSCOPIC CHOLECYSTECTOMY WITH INTRAOPERATIVE CHOLANGIOGRAM;  Surgeon: Ralene Ok, MD;  Location: [facility];  Service: General;  Laterality: N/A;   COLONOSCOPY WITH PROPOFOL N/A 07/05/2021   Procedure: COLONOSCOPY WITH PROPOFOL;  Surgeon: [name], DO;  [facility];  Service: Endoscopy;  Laterality: N/A;   ESOPHAGOGASTRODUODENOSCOPY (EGD) WITH PROPOFOL N/A 07/05/2021   Procedure: ESOPHAGOGASTRODUODENOSCOPY (EGD) WITH PROPOFOL;  Surgeon: [name], DO;  [facility];  Service: Endoscopy;  Laterality: N/A;   PARTIAL COLECTOMY N/A 07/06/2021   Procedure: RIGHT HEMICOLECTOMY;   Surgeon: [name] Signs, MD;  Location: [facility];  Service: General;  Laterality: N/A;   POLYPECTOMY  07/05/2021   Procedure: POLYPECTOMY;  Surgeon: [name], DO;  [facility];  Service: Endoscopy;;     Home Medications:  Prior to Admission medications   Medication Sig Start Date End Date Taking? Authorizing Provider  acetaminophen (TYLENOL) 500 MG tablet Take 1,000 mg by mouth every 6 (six) hours as needed.    [provider]  ferrous sulfate 325 (65 FE) MG EC tablet Take 1 tablet (325 mg total) by mouth 2 (two) times daily. 07/17/21 07/17/22  Kathie Dike, MD  levothyroxine (SYNTHROID) 100 MCG tablet Take 1 tablet (100 mcg total) by mouth daily. 11/10/19   Arnoldo Lenis, MD  loperamide (IMODIUM) 2 MG capsule Take 1 capsule (2 mg total) by mouth every 6 (six) hours as needed for diarrhea or loose stools. 07/17/21   Kathie Dike, MD  metoprolol succinate (TOPROL-XL) 25 MG 24 hr tablet Take 1 tablet (25 mg total) by mouth 2 (two) times daily. 07/17/21   Kathie Dike, MD  ondansetron (ZOFRAN-ODT) 4 MG disintegrating tablet Take 1 tablet (4 mg total) by mouth every 6 (six) hours as needed for nausea. 07/17/21   Kathie Dike, MD  oxyCODONE (OXY IR/ROXICODONE) 5 MG immediate release tablet Take 1 tablet (5 mg total) by mouth every 6 (six) hours as needed for moderate pain. 07/17/21   Kathie Dike, MD  pantoprazole (PROTONIX) 40  MG tablet Take 1 tablet (40 mg total) by mouth daily at 12 noon. 07/18/21   Kathie Dike, MD  polyethylene glycol (MIRALAX / GLYCOLAX) 17 g packet Take 17 g by mouth daily as needed for mild constipation. 07/17/21   Kathie Dike, MD  potassium chloride SA (KLOR-CON) 20 MEQ tablet TAKE ONE (1) TABLET EACH DAY Patient not taking: No sig reported 09/20/20   Arnoldo Lenis, MD  rivaroxaban (XARELTO) 20 MG TABS tablet Take 1 tablet (20 mg total) by mouth daily with supper. 07/17/21   Kathie Dike, MD  torsemide (DEMADEX) 20 MG tablet  Take 1 tablet (20 mg total) by mouth daily. 07/17/21   Kathie Dike, MD    Inpatient Medications: Scheduled Meds:  Chlorhexidine Gluconate Cloth  6 each Topical Daily   digoxin  0.5 mg Intravenous Once   Followed by   digoxin  0.25 mg Intravenous Once   enoxaparin (LOVENOX) injection  120 mg Subcutaneous Q12H   levothyroxine  50 mcg Intravenous Daily   mouth rinse  15 mL Mouth Rinse BID   pantoprazole (PROTONIX) IV  40 mg Intravenous Daily   sodium chloride flush  10-40 mL Intracatheter Q12H   Continuous Infusions:  dextrose 105 mL/hr at 07/27/21 1146   methocarbamol (ROBAXIN) IV 500 mg (07/25/21 2343)   piperacillin-tazobactam (ZOSYN)  IV 3.375 g (07/27/21 0923)   TPN ADULT (ION)     PRN Meds: diphenhydrAMINE, HYDROmorphone (DILAUDID) injection **OR** [DISCONTINUED]  HYDROmorphone (DILAUDID) injection, methocarbamol (ROBAXIN) IV, ondansetron (ZOFRAN) IV, sodium chloride flush  Allergies:    Allergies  Allergen Reactions   Codeine Itching   Compazine [Prochlorperazine Edisylate]    Prednisone Nausea And Vomiting   Tetanus Toxoids Other (See Comments)    Knot like "hen egg" came up    Vicodin [Hydrocodone-Acetaminophen] Itching    Social History:   Social History   Socioeconomic History   Marital status: Married    Spouse name: Not on file   Number of children: Not on file   Years of education: Not on file   Highest education level: Not on file  Occupational History   Not on file  Tobacco Use   Smoking status: Never   Smokeless tobacco: Never  Vaping Use   Vaping Use: Never used  Substance and Sexual Activity   Alcohol use: No   Drug use: No   Sexual activity: Not Currently  Other Topics Concern   Not on file  Social History Narrative   Not on file   Social Determinants of Health   Financial Resource Strain: Not on file  Food Insecurity: Not on file  Transportation Needs: Not on file  Physical Activity: Not on file  Stress: Not on file  Social  Connections: Not on file  Intimate Partner Violence: Not on file    Family History:    Family History  Problem Relation Age of Onset   Cancer Father        liver   Hypertension Brother    Heart disease Maternal Grandmother    Kidney disease Maternal Grandfather    Diabetes Maternal Grandfather    Kidney failure Brother      ROS:  Unable to assess due to lethargy  Physical Exam/Data:   Vitals:   07/27/21 0617 07/27/21 0924 07/27/21 1021 07/27/21 1151  BP: 99/69 (!) 103/58 (!) 98/51 (!) 96/42  Pulse: 100 (!) 157 88 (!) 112  Resp: '19 15 15 19  '$ Temp: 98.7 F (37.1 C) (!) 97.5 F (36.4  C)  98.7 F (37.1 C)  TempSrc: Axillary Axillary  Axillary  SpO2: 96% 93%  95%  Weight:      Height:        Intake/Output Summary (Last 24 hours) at 07/27/2021 1245 Last data filed at 07/26/2021 1753 Gross per 24 hour  Intake --  Output 500 ml  Net -500 ml    Last 3 Weights 07/26/2021 07/26/2021 07/25/2021  Weight (lbs) 272 lb 11.3 oz 279 lb 8.7 oz 274 lb 0.5 oz  Weight (kg) 123.7 kg 126.8 kg 124.3 kg     Body mass index is 49.88 kg/m.  General: Pale elderly WF, lethargic, in no acute distress. Head: Normocephalic, atraumatic, sclera non-icteric, no xanthomas, nares are without discharge. Neck: JVP not elevated. Lungs: Clear bilaterally to auscultation without wheezes, rales, or rhonchi. Breathing is unlabored. Heart: Irregular, tachycardic, S1 S2 without murmurs, rubs, or gallops.  Abdomen: Soft, midline wound with pouch in tact draining Rotundo stool Extremities: No clubbing or cyanosis. No pitting edema. Chronic lymphedema changes but otherwise no significant lower extremity edema.  Distal pedal pulses are 2+ and equal bilaterally. Neuro: oriented to name only, lethargy, arouses briefly before falling back asleep Psych:  Flat affect   EKG:  The EKG was personally reviewed and demonstrates:  Multiple tracings reviewed, last 07/18/21 Atrial fib 113bpm, nonspecific STTW changes, subtle ST  depression precordial leads  Telemetry:  Telemetry was personally reviewed and demonstrates:  AF RVR  Relevant CV Studies:   2D echo 06/29/21 Summary    1. Technically difficult study.    2. The left ventricle is mildly to moderately dilated in size with normal  wall thickness.    3. The left ventricular systolic function is moderately to severely  decreased, LVEF is visually estimated at 30%.    4. There is mild to moderate mitral valve regurgitation.    5. The left atrium is severely dilated in size.    6. The right ventricle is normal in size, with normal systolic function.    7. There is no pulmonary hypertension, estimated pulmonary artery systolic  pressure is 34 mmHg.    8. The right atrium is mildly dilated  in size.    2D echo 11/2012  - Left ventricle: The cavity size was normal. Borderline LVH    present. Systolic function was normal. The estimated    ejection fraction was in the range of 55% to 60%. Wall    motion was normal; there were no regional wall motion    abnormalities.  - Aortic valve: Mildly calcified annulus. Trileaflet.  - Left atrium: The atrium was mildly dilated.  - Atrial septum: No defect or patent foramen ovale was    identified.   Laboratory Data:  High Sensitivity Troponin:  No results for input(s): TROPONINIHS in the last 720 hours.   Chemistry Recent Labs  Lab 07/22/21 0323 07/23/21 0842 07/26/21 0615  NA 136 138 137  K 4.3 4.6 4.1  CL 104 108 108  CO2 '26 27 24  '$ GLUCOSE 111* 97 94  BUN '13 18 18  '$ CREATININE 0.66 0.52 0.56  CALCIUM 7.6* 7.6* 8.0*  GFRNONAA >60 >60 >60  ANIONGAP 6 3* 5     Recent Labs  Lab 07/23/21 0842 07/26/21 0615  PROT 4.8* 5.8*  ALBUMIN 1.3* 1.5*  AST 15 34  ALT 11 17  ALKPHOS 89 111  BILITOT 0.3 0.5    Hematology Recent Labs  Lab 07/24/21 0249 07/26/21 0615 07/27/21 0951  WBC 10.1  13.1* 9.7  RBC 3.15* 3.14* 3.83*  HGB 7.0* 7.2* 8.7*  HCT 24.4* 24.4* 30.5*  MCV 77.5* 77.7* 79.6*  MCH 22.2*  22.9* 22.7*  MCHC 28.7* 29.5* 28.5*  RDW 29.5* 30.2* 31.1*  PLT 223 225 144*    BNPNo results for input(s): BNP, PROBNP in the last 168 hours.  DDimer No results for input(s): DDIMER in the last 168 hours.   Radiology/Studies:  [place] EKG SITE RITE  Result Date: 07/27/2021 If Site Rite image not attached, placement could not be confirmed due to current cardiac rhythm.    Assessment and Plan:   1. Permanent atrial fibrillation with difficult to control rates in the context of continued hypotension - hypotension has been an issue even as outpatient without much baseline BP room for meds, likely exacerbated by underlying medical illness - given her LV dysfunction, will discontinue diltiazem and load with IV digoxin 0.'5mg'$  now and then 0.'25mg'$  in 6 hours - readdress plan for oral dosing in AM - revisit addition of metoprolol as BP dictates - currently on full dose Lovenox - if bleeding recurs would stop anticoagulation, needing to accept higher risk of stroke at baseline - no role for DCCV as afib is permanent - given that she is in atrial fib all the time, her tachycardia should be thought of in the same sense of sinus tach that it is driven by underlying physiologic stressors - recent TSH wnl @ [facility] 06/2021  2. Chronic combined CHF/cardiomyopathy of unconfirmed etiology (EF 30% with mild-moderate MR 06/28/21) - volume status looks OK - d/c diltiazem - not a candidate for aggressive invasive ischemic workup or advanced therapies at present time given critical illness and comorbidites - cannot add further GDMT due to hypotension  3. Metastatic colon CA - pathology report - has been too sick thus far to complete workup looking for additional metastases - further management per primary team  4. Lethargy/delirium - discussed with IM, raised question of whether neurologic imaging is warranted to exclude brain metastasis or other neurologic insult - Dr. [place] Pink reports exam is nonfocal and does  not think this is warranted at present time - consider imaging if this persists  5. Electrolyte disarray - will defer management to primary team  6. 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 The patient's score is based upon: CHF History: Yes HTN History: Yes Diabetes History: No Stroke History: No Vascular Disease History: No Age Score: 1 Gender Score: 1     For questions or updates, please contact [facility] Please consult [email or web address] for contact info under    Signed, Charlie Pitter, PA-C  07/27/2021 12:45 PM

## 2021-07-27 NOTE — Progress Notes (Signed)
PROGRESS NOTE  Lauren [name]:[id number] DOB: 11/17/1946 DOA: 07/18/2021 PCP: Pcp, No   LOS: 9 days   Brief narrative: Patient is 75 years old female with past medical history of atrial fibrillation, combined heart failure, hypertension, lymphedema, recent admission to the hospital for GI bleed secondary to cecal mass status post right hemicolectomy on 07/06/2021 at outside hospital.  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  She was also found to be COVID positive.  Patient was then brought in hospital and received IV fluids vasopressors antibiotic.  CT scan of the abdomen showed the fistula extending from the bowel adjacent to the suture through an umbilical hernia into the abdominal wall with fluid collection in the anterior abdominal wall and gas tracking.  Patient was admitted to the ICU on 07/18/2021 and was started on vasopressors and antibiotic.  On 07/19/21 patient was started on TPN and was taken off of Levophed.  Subsequently, patient was considered stable for transfer out of ICU.   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  T3 N2 M0 status.  CT scan showing some fistula with abdominal wall collection.  Initially thought to be abscess but when the midline wound was opened stool was noted to be present. General surgery continues to follow.  Patient was placed on bowel rest and TPN was initiated.  Remained on Zosyn. WBC was noted to be higher yesterday.  Consideration being given to repeating CT scan although patient seems to be somewhat agitated this morning.  Looks like she pulled out her PICC line overnight.  No labs have been done this morning. Electrolytes were being addressed by pharmacy. Dilaudid  dosage was increased for better pain control.  Acute delirium Noted to be somewhat agitated this morning.  She is oriented only to year.  Does not understand the seriousness of her illness.  She says she wants to go home but does not understand what can happen if she were to become really sick.  She does not seem to have capacity to decide for herself at this time.  No focal neurological deficits noted on examination.  We will give her a dose of Haldol intravenously to see if it would help calm her down.  Last EKG was reviewed and did not show prolonged QT interval. Hopefully PICC line can be replaced once she is more calm.  Incidental COVID 19 positive. Completed remdesivir.  On room air.   Permanent atrial fibrillation Patient was on Xarelto as outpatient.  Currently on full dose Lovenox. She was on metoprolol initially on a scheduled basis.  However heart rate remained poorly controlled so she was transitioned to Cardizem infusion.  Heart rate noted to be in the 140s this morning but is because cardioembolism.  Discussed with nursing staff.  They are waiting on a new Cardizem back to come down from the pharmacy. If blood pressure remains an issue on Cardizem infusion then may have to consider switching her over to amiodarone.  Chronic systolic CHF Care everywhere was reviewed.  She had an echocardiogram in August 2022 at [facility] which showed EF to be 30%. Seems to be fairly euvolemic at this time.  Continue to monitor volume status closely.  Septic shock  Patient seems to be stable from a sepsis standpoint.  Has been  off of vasopressors.  Remains on Zosyn.  Cultures have been negative.    Acute kidney injury Resolved.   Anemia, microcytic No signs of active bleeding.  Transfuse for hemoglobin less than 7.  Hemoglobin low but stable.  No overt bleeding noted.     Hyponatremia/hypokalemia/hypomagnesemia Sodium is stable.  Potassium, magnesium and phosphorus being addressed by pharmacy.    Goals of care Palliative care met with the patient and spoke to patient's brother who is the next of kin.  Patient does have a daughter but they are not in touch with each other.  Patient was changed over to DNR/DNI based on this conversation.   DVT prophylaxis: On Lovenox therapeutic  Code Status: Full code  Family Communication:  No family at bedside.  Patient's brother being updated periodically.  Spoke to him last on 9/1.  Status is: Inpatient  Remains inpatient appropriate because: IV treatments appropriate due to intensity of illness or inability to take PO, and Inpatient level of care appropriate due to severity of illness  Dispo: The patient is from: SNF              Anticipated d/c is to: SNF              Patient currently is not medically stable to d/c.   Difficult to place patient No   Consultants: General surgery PCCM  Procedures: PICC line placement  Anti-infectives:  Remdesivir- completed Zosyn IV   Subjective: Patient noted to be agitated.  Moving all of her extremities.  Insisting on food and water.  Wants to go home.   Objective: Vitals:   07/27/21 0617 07/27/21 0924  BP: 99/69 (!) 103/58  Pulse: 100 (!) 157  Resp: 19 15  Temp: 98.7 F (37.1 C) (!) 97.5 F (36.4 C)  SpO2: 96%     Intake/Output Summary (Last 24 hours) at 07/27/2021 0956 Last data filed at 07/26/2021 1753 Gross per 24 hour  Intake --  Output 1150 ml  Net -1150 ml    Filed Weights   07/25/21 0307 07/26/21 0340 07/26/21 1333  Weight: 124.3 kg 126.8 kg 123.7 kg   Body mass index is 49.88 kg/m.   Physical Exam:  General appearance: She is awake and alert.  Agitated.  No distress. Resp: Mildly tachypneic but clear to auscultation bilaterally. Cardio: S1-S2 is tachycardic, irregularly irregular  GI: Abdomen is soft.  Wound noted in the midline with stool draining into Eakin pouch.   Extremities: no edema.  Moving all of her extremities Neurologic: Oriented only to year.   No focal neurological deficits.      Data Review: I have personally reviewed the following laboratory data and studies,  CBC: Recent Labs  Lab 07/21/21 0611 07/22/21 0323 07/23/21 0023 07/24/21 0249 07/26/21 0615  WBC 9.7 9.8 9.8 10.1 13.1*  HGB 7.7* 7.3* 7.2* 7.0* 7.2*  HCT 26.6* 25.0* 25.4* 24.4* 24.4*  MCV 76.0* 76.0* 77.4* 77.5* 77.7*  PLT 238 219 217 223 213    Basic Metabolic Panel: Recent Labs  Lab 07/21/21 0611 07/22/21 0323 07/23/21 0842 07/26/21 0615  NA 134* 136 138 137  K 3.9 4.3 4.6 4.1  CL 105 104 108 108  CO2 _0 GLUCOSE 110* 111* 97 94  BUN _1 CREATININE 0.85 0.66 0.52 0.56  CALCIUM 7.5* 7.6* 7.6* 8.0*  MG 1.8 1.8 2.0 1.6*  PHOS 2.2* 2.2* 2.5 3.3    Liver Function Tests: Recent Labs  Lab 07/23/21  4128 07/26/21 0615  AST 15 34  ALT 11 17  ALKPHOS 89 111  BILITOT 0.3 0.5  PROT 4.8* 5.8*  ALBUMIN 1.3* 1.5*     CBG: Recent Labs  Lab 07/20/21 2346 07/21/21 0403 07/21/21 1609 07/22/21 0021 07/22/21 0345  GLUCAP 103* 103* 109* 117* 109*    Recent Results (from the past 240 hour(s))  Blood culture (routine single)     Status: None   Collection Time: 07/18/21  8:35 AM   Specimen: Right Antecubital; Blood  Result Value Ref Range Status   Specimen Description RIGHT ANTECUBITAL  Final   Special Requests   Final    BOTTLES DRAWN AEROBIC AND ANAEROBIC Blood Culture adequate volume   Culture   Final    NO GROWTH 5 DAYS Performed at [facility], [address], [place], [place] [address]    Report Status 07/23/2021 FINAL  Final  Resp Panel by RT-PCR (Flu A&B, Covid) Nasopharyngeal Swab     Status: Abnormal   Collection Time: 07/18/21  9:18 AM   Specimen: Nasopharyngeal Swab; Nasopharyngeal(NP) swabs in vial transport medium  Result Value Ref Range Status   SARS Coronavirus 2 by RT PCR POSITIVE (A) NEGATIVE Final    Comment: RESULT CALLED TO, READ BACK BY AND VERIFIED WITH: OAKLY,B AT 1425 BY HUFFINES,S ON  07/18/21. (NOTE) SARS-CoV-2 target nucleic acids are DETECTED.  The SARS-CoV-2 RNA is generally detectable in upper respiratory specimens during the acute phase of infection. Positive results are indicative of the presence of the identified virus, but do not rule out bacterial infection or co-infection with other pathogens not detected by the test. Clinical correlation with patient history and other diagnostic information is necessary to determine patient infection status. The expected result is Negative.  Fact Sheet for Patients: EntrepreneurPulse.com.au  Fact Sheet for Healthcare Providers: IncredibleEmployment.be  This test is not yet approved or cleared by the [place] FDA and  has been authorized for detection and/or diagnosis of SARS-CoV-2 by FDA under an Emergency Use Authorization (EUA).  This EUA will remain in effect (meaning this te st can be used) for the duration of  the COVID-19 declaration under Section 564(b)(1) of the Act, 21 U.S.C. section 360bbb-3(b)(1), unless the authorization is terminated or revoked sooner.     Influenza A by PCR NEGATIVE NEGATIVE Final   Influenza B by PCR NEGATIVE NEGATIVE Final    Comment: (NOTE) The Xpert Xpress SARS-CoV-2/FLU/RSV plus assay is intended as an aid in the diagnosis of influenza from Nasopharyngeal swab specimens and should not be used as a sole basis for treatment. Nasal washings and aspirates are unacceptable for Xpert Xpress SARS-CoV-2/FLU/RSV testing.  Fact Sheet for Patients: EntrepreneurPulse.com.au  Fact Sheet for Healthcare Providers: IncredibleEmployment.be  This test is not yet approved or cleared by the [place] FDA and has been authorized for detection and/or diagnosis of SARS-CoV-2 by FDA under an Emergency Use Authorization (EUA). This EUA will remain in effect (meaning this test can be used) for the duration of  the COVID-19 declaration under Section 564(b)(1) of the Act, 21 U.S.C. section 360bbb-3(b)(1), unless the authorization is terminated or revoked.  Performed at [facility], [address], [place],  [address]   Blood Culture (routine x 2)     Status: None   Collection Time: 07/18/21 10:22 AM   Specimen: BLOOD  Result Value Ref Range Status   Specimen Description BLOOD RIGHT ANTECUBITAL  Final   Special Requests   Final    BOTTLES DRAWN AEROBIC  AND ANAEROBIC Blood Culture adequate volume   Culture   Final    NO GROWTH 5 DAYS Performed at [facility], [address], [place], [place] [address]    Report Status 07/23/2021 FINAL  Final  MRSA Next Gen by PCR, Nasal     Status: None   Collection Time: 07/18/21  8:51 PM   Specimen: Nasal Mucosa; Nasal Swab  Result Value Ref Range Status   MRSA by PCR Next Gen NOT DETECTED NOT DETECTED Final    Comment: (NOTE) The GeneXpert MRSA Assay (FDA approved for NASAL specimens only), is one component of a comprehensive MRSA colonization surveillance program. It is not intended to diagnose MRSA infection nor to guide or monitor treatment for MRSA infections. Test performance is not FDA approved in patients less than 52 years old. Performed at [facility], [place] [address] [place], [place] [address]       Studies: No results found.    Bonnielee Haff, MD  [facility] 07/27/2021  If 7PM-7AM, please contact night-coverage

## 2021-07-27 NOTE — Progress Notes (Signed)
PHARMACY - TOTAL PARENTERAL NUTRITION CONSULT NOTE  Indication: Fistula  Patient Measurements: Height: '5\' 2"'$  (157.5 cm) Weight: 123.7 kg (272 lb 11.3 oz) IBW/kg (Calculated) : 50.1 TPN AdjBW (KG): 66.6 Body mass index is 49.88 kg/m.  Assessment:  38 YOF with recent R-hemicolectomy on 8/12 in the setting of a cecal mass - consistent with R-colon metastatic adenocarcinoma. 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 Pharmacy consulted to manage TPN for nutritional support in the setting of a colocutaneous fistula.    9/2: PICC pulled out 9/1 pm - TPN infused for ~5 hrs  Glucose / Insulin: CBGs <120, off SSI 07/22/21 Electrolytes: all WNL except Mag at 1.6 (goal >/= 2 for Afib) Renal:  SCr < 1, BUN WNL Hepatic: LFTs / tibili / TG WNL, albumin 1.3  Intake / Output; MIVF:  UOP 1198m, ostomy 089m  GI Imaging: 8/24 Abd CT: colocutaneous fistula, 7.4cm abd wall abscess, soft tissue edema, cellulitis vs nec fasciitis 8/25 Abd xray: diffuse abd wall emphysema, nonobstructive bowel gas pattern GI Surgeries / Procedures:  8/12 (last admit): R-hemicolectomy  Central access: PICC pending replacement 07/27/21 TPN start date: 07/19/21  Nutritional Goals (RD assessment on 8/25): 2300-2500 kcal, 140-160g AA, fluid >/= 2L per day  Current Nutrition:  TPN  Plan:  Continue TPN at goal rate of 105 mL/hr, providing 151g AA and 2373 kCal meeting 100% of estimated needs Electrolytes in TPN: Na 125 mEq/L, K 3033mL, Ca 4mE72m, increase Mg to 11mE75m Phos 22mmo70m Cl:Ac 1:1 Add standard MVI and trace elements to TPN Begin D10 at 105 ml/hr until new TPN hung at 18:00 - discussed with TR[facility] Standard TPN labs Mon/Thurs, CMP in am  Thank you for involving pharmacy in this patient's care.  JennifRenold GentamD, BCPS Clinical Pharmacist Clinical phone for 07/27/2021 until 3p is [phone] 7:12 AM  **Pharmacist phone directory can be found  on [email or web address] [place]

## 2021-07-27 NOTE — Plan of Care (Signed)
  Problem: Fluid Volume: Goal: Hemodynamic stability will improve Outcome: Progressing   Problem: Clinical Measurements: Goal: Diagnostic test results will improve Outcome: Progressing Goal: Signs and symptoms of infection will decrease Outcome: Progressing   Problem: Respiratory: Goal: Ability to maintain adequate ventilation will improve Outcome: Progressing   Problem: Education: Goal: Knowledge of General Education information will improve Description: Including pain rating scale, medication(s)/side effects and non-pharmacologic comfort measures Outcome: Progressing   Problem: Health Behavior/Discharge Planning: Goal: Ability to manage health-related needs will improve Outcome: Progressing   Problem: Clinical Measurements: Goal: Ability to maintain clinical measurements within normal limits will improve Outcome: Progressing Goal: Will remain free from infection Outcome: Progressing Goal: Diagnostic test results will improve Outcome: Progressing Goal: Respiratory complications will improve Outcome: Progressing Goal: Cardiovascular complication will be avoided Outcome: Progressing   Problem: Activity: Goal: Risk for activity intolerance will decrease Outcome: Progressing   Problem: Nutrition: Goal: Adequate nutrition will be maintained Outcome: Progressing   Problem: Coping: Goal: Level of anxiety will decrease Outcome: Progressing   Problem: Elimination: Goal: Will not experience complications related to bowel motility Outcome: Progressing Goal: Will not experience complications related to urinary retention Outcome: Progressing   Problem: Pain Managment: Goal: General experience of comfort will improve Outcome: Progressing   Problem: Safety: Goal: Ability to remain free from injury will improve Outcome: Progressing   Problem: Skin Integrity: Goal: Risk for impaired skin integrity will decrease Outcome: Progressing   Problem: Education: Goal:  Knowledge about tracheostomy care/management will improve Outcome: Progressing   Problem: Activity: Goal: Ability to tolerate increased activity will improve Outcome: Progressing   Problem: Health Behavior/Discharge Planning: Goal: Ability to manage tracheostomy will improve Outcome: Progressing   Problem: Respiratory: Goal: Patent airway maintenance will improve Outcome: Progressing   Problem: Role Relationship: Goal: Ability to communicate will improve Outcome: Progressing

## 2021-07-27 NOTE — Progress Notes (Signed)
Peripherally Inserted Central Catheter Placement  The IV Nurse has discussed with the patient and/or persons authorized to consent for the patient, the purpose of this procedure and the potential benefits and risks involved with this procedure.  The benefits include less needle sticks, lab draws from the catheter, and the patient may be discharged home with the catheter. Risks include, but not limited to, infection, bleeding, blood clot (thrombus formation), and puncture of an artery; nerve damage and irregular heartbeat and possibility to perform a PICC exchange if needed/ordered by physician.  Alternatives to this procedure were also discussed.  Bard Power PICC patient education guide, fact sheet on infection prevention and patient information card has been provided to patient /or left at bedside.    PICC Placement Documentation  PICC Triple Lumen [id number] PICC Right Basilic 36 cm 0 cm (Active)  Indication for Insertion or Continuance of Line Administration of hyperosmolar/irritating solutions (i.e. TPN, Vancomycin, etc.) 07/27/21 1752  Exposed Catheter (cm) 0 cm 07/27/21 1752  Site Assessment Clean;Dry;Intact 07/27/21 1752  Lumen #1 Status Flushed;Saline locked;Blood return noted 07/27/21 1752  Lumen #2 Status Flushed;Saline locked;Blood return noted 07/27/21 1752  Lumen #3 Status Flushed;Saline locked;Blood return noted 07/27/21 1752  Dressing Type Transparent 07/27/21 1752  Dressing Status Clean;Dry;Intact 07/27/21 1752  Antimicrobial disc in place? Yes 07/27/21 1752  Dressing Intervention New dressing;Other (Comment) 07/27/21 1752  Dressing Change Due 08/03/21 07/27/21 1752   Used previuos consent    Lauren [name] 07/27/2021, 5:53 PM

## 2021-07-27 NOTE — Progress Notes (Deleted)
Disregard, opened in error

## 2021-07-27 NOTE — Progress Notes (Signed)
ANTICOAGULATION CONSULT NOTE - Follow Up Consult  Pharmacy Consult for Lovenox, Zosyn  Indication: Afib, Septic shock, Colocutaneous fistula.  Allergies  Allergen Reactions   Codeine Itching   Compazine [Prochlorperazine Edisylate]    Prednisone Nausea And Vomiting   Tetanus Toxoids Other (See Comments)    Knot like "hen egg" came up    Vicodin [Hydrocodone-Acetaminophen] Itching    Patient Measurements: Height: '5\' 2"'$  (157.5 cm) Weight: 123.7 kg (272 lb 11.3 oz) IBW/kg (Calculated) : 50.1 Heparin Dosing Weight:   Vital Signs: Temp: 97.5 F (36.4 C) (09/02 0924) Temp Source: Axillary (09/02 0924) BP: 96/42 (09/02 1151) Pulse Rate: 112 (09/02 1151)  Labs: Recent Labs    07/26/21 0615 07/27/21 0951  HGB 7.2* 8.7*  HCT 24.4* 30.5*  PLT 225 144*  CREATININE 0.56  --      Estimated Creatinine Clearance: 79.8 mL/min (by C-G formula based on SCr of 0.56 mg/dL).   Assessment: Anticoag: Xarelto PTA for afib> heparin while NPO. FOBT + 8/23. - Hgb 7.2>8.7?Marland Kitchen Plts WNL. LMWH with no plans for surgery.No signs of active bleeding.  ID: Septic shock, Colocutaneous fistula. LA improved. WBC 13.1>9.7. Tmax 99.3  Vancomycin 8/24 >>8/25 Anidul 8/24>> 8/25 Zosyn 8/24 >> 8/25, restarted 8/25>> cefepime 8/24 >> 8/24 Meropenem 8/25>>8/25 Flagyl 8/24 x 1 in ED Remdesivir 8/25-8/27  Microbiology results: 8/24 BCx: negative 08/24 MRSA PCR negative 08/24 C diff - 08/24 COVID +  Plan: Zosyn #9 now with low grade fever and leukocytosis. Lovenox '120mg'$  SQ q12 hrs CBC q72h on LMWH Would recommend repeat CT scan to rule out intra-abdominal abscess.    Lauren [name] Highland, PharmD, BCPS Clinical Staff Pharmacist [email or web address] [name] Highland, Bettey Muraoka Stillinger 07/27/2021,12:38 PM

## 2021-07-28 ENCOUNTER — Inpatient Hospital Stay (HOSPITAL_COMMUNITY): Payer: Medicare HMO

## 2021-07-28 DIAGNOSIS — R6521 Severe sepsis with septic shock: Secondary | ICD-10-CM | POA: Diagnosis not present

## 2021-07-28 DIAGNOSIS — K632 Fistula of intestine: Secondary | ICD-10-CM | POA: Diagnosis not present

## 2021-07-28 DIAGNOSIS — Z66 Do not resuscitate: Secondary | ICD-10-CM

## 2021-07-28 DIAGNOSIS — U071 COVID-19: Secondary | ICD-10-CM

## 2021-07-28 DIAGNOSIS — Z7189 Other specified counseling: Secondary | ICD-10-CM

## 2021-07-28 DIAGNOSIS — I4891 Unspecified atrial fibrillation: Secondary | ICD-10-CM | POA: Diagnosis not present

## 2021-07-28 DIAGNOSIS — A419 Sepsis, unspecified organism: Secondary | ICD-10-CM | POA: Diagnosis not present

## 2021-07-28 DIAGNOSIS — D509 Iron deficiency anemia, unspecified: Secondary | ICD-10-CM | POA: Diagnosis not present

## 2021-07-28 DIAGNOSIS — Z515 Encounter for palliative care: Secondary | ICD-10-CM

## 2021-07-28 DIAGNOSIS — I5022 Chronic systolic (congestive) heart failure: Secondary | ICD-10-CM | POA: Diagnosis not present

## 2021-07-28 LAB — CBC
HCT: 18.2 % — ABNORMAL LOW (ref 36.0–46.0)
HCT: 12.7 % — ABNORMAL LOW (ref 36.0–46.0)
HCT: 19.4 % — ABNORMAL LOW (ref 36.0–46.0)
Hemoglobin: 5.2 g/dL — CL (ref 12.0–15.0)
Hemoglobin: 4 g/dL — CL (ref 12.0–15.0)
Hemoglobin: 5.4 g/dL — CL (ref 12.0–15.0)
MCH: 23.2 pg — ABNORMAL LOW (ref 26.0–34.0)
MCH: 31.5 pg (ref 26.0–34.0)
MCH: 23.2 pg — ABNORMAL LOW (ref 26.0–34.0)
MCHC: 28.6 g/dL — ABNORMAL LOW (ref 30.0–36.0)
MCHC: 31.5 g/dL (ref 30.0–36.0)
MCHC: 27.8 g/dL — ABNORMAL LOW (ref 30.0–36.0)
MCV: 81.3 fL (ref 80.0–100.0)
MCV: 100 fL (ref 80.0–100.0)
MCV: 83.3 fL (ref 80.0–100.0)
Platelets: 167 10*3/uL (ref 150–400)
Platelets: 106 10*3/uL — ABNORMAL LOW (ref 150–400)
Platelets: 171 10*3/uL (ref 150–400)
RBC: 2.24 MIL/uL — ABNORMAL LOW (ref 3.87–5.11)
RBC: 1.27 MIL/uL — ABNORMAL LOW (ref 3.87–5.11)
RBC: 2.33 MIL/uL — ABNORMAL LOW (ref 3.87–5.11)
RDW: 30.5 % — ABNORMAL HIGH (ref 11.5–15.5)
RDW: 33.2 % — ABNORMAL HIGH (ref 11.5–15.5)
RDW: 30.7 % — ABNORMAL HIGH (ref 11.5–15.5)
WBC: 14.4 10*3/uL — ABNORMAL HIGH (ref 4.0–10.5)
WBC: 8.1 10*3/uL (ref 4.0–10.5)
WBC: 16.2 10*3/uL — ABNORMAL HIGH (ref 4.0–10.5)
nRBC: 0.2 % (ref 0.0–0.2)
nRBC: 0.6 % — ABNORMAL HIGH (ref 0.0–0.2)
nRBC: 0.2 % (ref 0.0–0.2)

## 2021-07-28 LAB — COMPREHENSIVE METABOLIC PANEL
ALT: 18 U/L (ref 0–44)
AST: 33 U/L (ref 15–41)
Albumin: 1.1 g/dL — ABNORMAL LOW (ref 3.5–5.0)
Alkaline Phosphatase: 89 U/L (ref 38–126)
Anion gap: 3 — ABNORMAL LOW (ref 5–15)
BUN: 13 mg/dL (ref 8–23)
CO2: 26 mmol/L (ref 22–32)
Calcium: 7.7 mg/dL — ABNORMAL LOW (ref 8.9–10.3)
Chloride: 111 mmol/L (ref 98–111)
Creatinine, Ser: 0.59 mg/dL (ref 0.44–1.00)
GFR, Estimated: >60 mL/min (ref >60)
Glucose, Bld: 117 mg/dL — ABNORMAL HIGH (ref 70–99)
Potassium: 3.7 mmol/L (ref 3.5–5.1)
Sodium: 140 mmol/L (ref 135–145)
Total Bilirubin: 0.5 mg/dL (ref 0.3–1.2)
Total Protein: 4.9 g/dL — ABNORMAL LOW (ref 6.5–8.1)

## 2021-07-28 LAB — TSH: TSH: 10.36 u[IU]/mL — ABNORMAL HIGH (ref 0.350–4.500)

## 2021-07-28 LAB — PROCALCITONIN: Procalcitonin: 0.26 ng/mL

## 2021-07-28 LAB — PREPARE RBC (CROSSMATCH)

## 2021-07-28 MED ORDER — MAGNESIUM SULFATE 2 GM/50ML IV SOLN
2.0000 g | Freq: Once | INTRAVENOUS | Status: AC
  Administered 2021-07-28: 2 g via INTRAVENOUS
  Filled 2021-07-28: qty 50

## 2021-07-28 MED ORDER — SODIUM CHLORIDE 0.9% IV SOLUTION: Freq: Once | INTRAVENOUS | Status: AC

## 2021-07-28 MED ORDER — PANTOPRAZOLE SODIUM 40 MG IV SOLR
40.0000 mg | Freq: Two times a day (BID) | INTRAVENOUS | Status: DC
  Administered 2021-07-28 – 2021-08-07 (×20): 40 mg via INTRAVENOUS
  Filled 2021-07-28 (×20): qty 40

## 2021-07-28 MED ORDER — AMIODARONE HCL IN DEXTROSE 360-4.14 MG/200ML-% IV SOLN
60.0000 mg/h | INTRAVENOUS | Status: AC
  Administered 2021-07-28: 60 mg/h via INTRAVENOUS
  Filled 2021-07-28: qty 200

## 2021-07-28 MED ORDER — POTASSIUM CHLORIDE 10 MEQ/50ML IV SOLN
10.0000 meq | INTRAVENOUS | Status: AC
  Administered 2021-07-28 (×3): 10 meq via INTRAVENOUS
  Filled 2021-07-28 (×3): qty 50

## 2021-07-28 MED ORDER — AMIODARONE HCL IN DEXTROSE 360-4.14 MG/200ML-% IV SOLN
30.0000 mg/h | INTRAVENOUS | Status: DC
  Administered 2021-07-28 – 2021-08-09 (×23): 30 mg/h via INTRAVENOUS
  Filled 2021-07-28 (×30): qty 200

## 2021-07-28 MED ORDER — TRAVASOL 10 % IV SOLN
INTRAVENOUS | Status: AC
  Filled 2021-07-28: qty 1512

## 2021-07-28 MED ORDER — AMIODARONE LOAD VIA INFUSION
150.0000 mg | Freq: Once | INTRAVENOUS | Status: AC
  Administered 2021-07-28: 150 mg via INTRAVENOUS
  Filled 2021-07-28: qty 83.34

## 2021-07-28 NOTE — Progress Notes (Signed)
   Daily Progress Note   Patient Name: Lauren [name]       Date: 07/28/2021 DOB: [birth year]  Age: 75 y.o. MRN#: [id number] Attending Physician: Bonnielee Haff, MD Primary Care Physician: Pcp, No Admit Date: 07/18/2021  Reason for Consultation/Follow-up: Establishing goals of care  Subjective: Chart Reviewed. Updates Received. Patient Assessed.   Patient is much more alert today and responds to questions. States she feels "horrible". Denies pain or shortness of breath.  Lauren [name] is ill-appearing.   Patient's brother has been updated on patient's condition. Unfortunately Lauren [name] isn't showing signs of a meaningful recovery and would be appropriate for comfort focused care and consideration for residential hospice. Lauren [name] verbalizes understanding of patient's current illness and co-morbidities. He is sadden that he is not able to come visit due to his health challenges and patient's COVID status.   Lauren [name] states he will continue to discuss decisions with his wife. For now he wishes to continue to treat the treatable. He is remaining hopeful but is preparing for no improvement or further decline. He is clear he would not want his sister to be in a suffering state and would make the best decisions for her as appropriate.   All questions answered and support provided.   Length of Stay: 10 days  Vital Signs: BP (!) 118/52 (BP Location: Left Arm)   Pulse 77   Temp 98.1 F (36.7 C) (Oral)   Resp 20   Ht '5\' 2"'$  (1.575 m)   Wt 123.7 kg   LMP 09/13/2016   SpO2 99%   BMI 49.88 kg/m  SpO2: SpO2: 99 % O2 Device: O2 Device: Room Air O2 Flow Rate:    Physical Exam: ill appearing  Tachycardic, irregularly irregular            Palliative Care Assessment & Plan  HPI: Palliative Care consult requested for goals of care discussion in this 75 y.o. female  with past medical history of hypertension, combined CHF, atrial fibrillation (Xarelto), hypothyroidism, lymphedema, and obesity. She was  admitted on [id number] from [facility] with complaints of nausea, abdominal pain, and hypotension. Patient was recently admitted for GI bleed secondary to cecal mass s/p right hemicolectomy (07/06/2021). During work-up found to be COVID positive. Patient requiring TPN. Has weaned off of vasopressors.     Code Status: DNR  Goals of Care/Recommendations: Patient is il appearing. She is not showing improvement with concerns for inability to make a meaningful recovery.  Brother has been updated at length. Encouraged ongoing discussions with consideration of comfort focused care.  PMT will continue to support and follow.   Prognosis: Guarded-Poor   Discharge Planning: To Be Determined  Thank you for allowing the Palliative Medicine Team to assist in the care of this patient.  Time Total: 40 min.    Visit consisted of counseling and education dealing with the complex and emotionally intense issues of symptom management and palliative care in the setting of serious and potentially life-threatening illness.Greater than 50%  of this time was spent counseling and coordinating care related to the above assessment and plan.  Alda Lea, AGPCNP-BC  Palliative Medicine Team [phone]

## 2021-07-28 NOTE — Progress Notes (Signed)
PHARMACY - TOTAL PARENTERAL NUTRITION CONSULT NOTE  Indication: Fistula  Patient Measurements: Height: '5\' 2"'$  (157.5 cm) Weight: 123.7 kg (272 lb 11.3 oz) IBW/kg (Calculated) : 50.1 TPN AdjBW (KG): 66.6 Body mass index is 49.88 kg/m.  Assessment:  34 YOF with recent R-hemicolectomy on 8/12 in the setting of a cecal mass - consistent with R-colon metastatic adenocarcinoma. 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 Pharmacy consulted to manage TPN for nutritional support in the setting of a colocutaneous fistula.     Glucose / Insulin: CBGs <120, off SSI 07/22/21 Electrolytes: all WNL except K 3.7 (goal >/=2 for Afib), Mag 1.9 (goal >/= 2 for Afib) - low K/Mag may be due to TPN off 9/2 Renal:  SCr < 1, BUN WNL Hepatic: LFTs / tibili / TG WNL, albumin 1.31 Intake / Output; MIVF:  UOP 236m + 2 occurrences, ostomy 010m  GI Imaging: 8/24 Abd CT: colocutaneous fistula, 7.4cm abd wall abscess, soft tissue edema, cellulitis vs nec fasciitis 8/25 Abd xray: diffuse abd wall emphysema, nonobstructive bowel gas pattern GI Surgeries / Procedures:  8/12 (last admit): R-hemicolectomy  Central access: PICC replaced 07/27/21 TPN start date: 07/19/21  Nutritional Goals (RD assessment on 8/25): 2300-2500 kcal, 140-160g AA, fluid >/= 2L per day  Current Nutrition:  TPN  Plan:  Continue TPN at goal rate of 105 mL/hr, providing 151g AA and 2373 kCal meeting 100% of estimated needs Electrolytes in TPN: Na 125 mEq/L, K 3078mL, Ca 4mE71m, increase Mg to 11mE33m Phos 22mmo35m Cl:Ac 1:1 Add standard MVI and trace elements to TPN Standard TPN labs Mon/Thurs, CMP in am  KCl 10 meq IV q1h x3 Mag 2 g IV x1  Thank you for involving pharmacy in this patient's care.  JennifRenold GentamD, BCPS Clinical Pharmacist Clinical phone for 07/28/2021 until 3p is [phone] 7:05 AM  **Pharmacist phone directory can be found on [email or web address]  under [facility]

## 2021-07-28 NOTE — Progress Notes (Addendum)
PROGRESS NOTE  Lauren [name] INO:[id number] DOB: July 16, 1946 DOA: 07/18/2021 PCP: Pcp, No   LOS: 10 days   Brief narrative: Patient is 75 years old female with past medical history of atrial fibrillation, combined heart failure, hypertension, lymphedema, recent admission to the hospital for GI bleed secondary to cecal mass status post right hemicolectomy on 07/06/2021 at outside hospital.  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  She was also found to be COVID positive.  Patient was then brought in hospital and received IV fluids vasopressors antibiotic.  CT scan of the abdomen showed the fistula extending from the bowel adjacent to the suture through an umbilical hernia into the abdominal wall with fluid collection in the anterior abdominal wall and gas tracking.  Patient was admitted to the ICU on 07/18/2021 and was started on vasopressors and antibiotic.  On 07/19/21 patient was started on TPN and was taken off of Levophed.  Subsequently, patient was considered stable for transfer out of ICU.   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  T3 N2 M0 status.  CT scan showing some fistula with abdominal wall collection.  Initially thought to be abscess but when the midline wound was opened stool was noted to be present. General surgery continues to follow.  Patient was placed on bowel rest and TPN was initiated.  Remains on Zosyn. WBC was noted to be elevated on 9/1 but noted to be normal on 9/2.  Blood work from today appears to be diluted as it was drawn from PICC line.  Waiting on confirmatory blood draws. General surgery did recommend repeating CT scan prior to discharge.  No further episodes of fever noted.   Continue to monitor for now.   TPN was reinitiated after PICC line was replaced 9/2. Electrolytes were being addressed by pharmacy. Dilaudid dosage was increased for better pain control.  Acute delirium Mentation appears to be improved this morning.  Continue Haldol as needed.  No focal neurological deficits noted.    Pain in the left hip area Patient this morning complains of pain in the left hip area.  No recent falls or injuries.  Restricted range of motion noted.  Could be due to body positioning.  CT scan of the abdomen pelvis done on 8/24 did not reveal any musculoskeletal abnormalities.  Continue to monitor for now.  May need to do imaging studies if pain does not subside.  Incidental COVID 19 positive. Completed remdesivir.  On room air.   Permanent atrial fibrillation Patient was on Xarelto as outpatient.  Currently on full dose Lovenox. She was on metoprolol initially on a scheduled basis.  However heart rate remained poorly controlled so she was transitioned to Cardizem infusion.   Due to poorly controlled heart rate cardiology was consulted yesterday.  Patient was given digoxin.  Plan is to initiate amiodarone today.    Chronic systolic CHF Care everywhere was reviewed.  She had an echocardiogram in August 2022 at [facility] which showed EF to be 30%. Seems to be fairly euvolemic at this time.  Continue to monitor volume status closely.  Septic shock  Patient seems to be stable from a sepsis standpoint.  Has been off of vasopressors.  Remains on Zosyn.  Cultures have been negative.    Acute kidney injury  Resolved.   Anemia, microcytic No signs of active bleeding.  Transfuse for hemoglobin less than 7.  Precipitous drop in hemoglobin noted today.  Looks to be diluted sample.  No evidence for overt bleeding.  Will have lab sticker instead of getting a sample through the PICC line.  If it remains low then we may need to do a CT scan of her abdomen and pelvis.   ADDENDUM: Repeat Hgb  cam back at 5.4. Will transfuse 2 units of PRBC. Reason for drop in hgb is not clear. No overt bleeding noted. Will do CT AP. Already on PPI. BP and HR is stable.   Hyponatremia/hypokalemia/hypomagnesemia Sodium is stable.  Potassium, magnesium and phosphorus being addressed by pharmacy.   Goals of care Palliative care met with the patient and spoke to patient's brother who is the next of kin.  Patient does have a daughter but they are not in touch with each other.  Patient was changed over to DNR/DNI based on this conversation.   DVT prophylaxis: On Lovenox therapeutic  Code Status: Full code  Family Communication:  No family at bedside.  Patient's brother being updated periodically.  Spoke to him last on 9/1.  Status is: Inpatient  Remains inpatient appropriate because: IV treatments appropriate due to intensity of illness or inability to take PO, and Inpatient level of care appropriate due to severity of illness  Dispo: The patient is from: SNF              Anticipated d/c is to: SNF              Patient currently is not medically stable to d/c.   Difficult to place patient No   Consultants: General surgery PCCM  Procedures: PICC line placement  Anti-infectives:  Remdesivir- completed Zosyn IV   Subjective: Patient noted to be anxious this morning.  Denies any chest pain or shortness of breath.  Abdominal pain is stable.   Objective: Vitals:   07/28/21 0000 07/28/21 0319  BP: 110/79 (!) 118/52  Pulse: 95 77  Resp: (!) 22 20  Temp: (!) 97.5 F (36.4 C) 98.1 F (36.7 C)  SpO2: 98% 99%    Intake/Output Summary (Last 24 hours) at 07/28/2021 1234 Last data filed at 07/28/2021 0319 Gross per 24 hour  Intake 838.83 ml  Output 200 ml  Net 638.83 ml    Filed Weights   07/25/21 0307 07/26/21 0340 07/26/21 1333  Weight: 124.3 kg 126.8 kg 123.7 kg   Body mass index is 49.88 kg/m.   Physical Exam:  General appearance: Awake alert.  In no distress.   Anxious Resp: Clear to auscultation bilaterally.  Normal effort Cardio: S1-S2 is irregularly irregular, tachycardic.   GI: Abdomen is soft.  Midline wound noted draining stool into an Eakin pouch.  Tender without any rebound rigidity or guarding. Extremities: No edema.   Neurologic: No focal neurological deficits.    Data Review: I have personally reviewed the following laboratory data and studies,  CBC: Recent Labs  Lab 07/24/21 0249 07/26/21 0615 07/27/21 0951 07/28/21 0813 07/28/21 1114  WBC 10.1 13.1* 9.7 14.4* 8.1  HGB 7.0* 7.2* 8.7* 5.2* 4.0*  HCT 24.4* 24.4* 30.5* 18.2* 12.7*  MCV 77.5* 77.7* 79.6* 81.3 100.0  PLT 223 225 144* 167 PENDING    Basic Metabolic Panel: Recent Labs  Lab 07/22/21 0323 07/23/21 0842 07/26/21 0615 07/27/21 0951 07/28/21 0813  NA 136 138 137  --  140  K 4.3 4.6 4.1  --  3.7  CL 104 108 108  --  111  CO2 _0 --  26  GLUCOSE 111* 97 94  --  117*  BUN _1 --  13  CREATININE 0.66 0.52 0.56  --  0.59  CALCIUM 7.6* 7.6* 8.0*  --  7.7*  MG 1.8 2.0 1.6* 1.9  --   PHOS 2.2* 2.5 3.3  --   --     Liver Function Tests: Recent Labs  Lab 07/23/21 0842 07/26/21 0615 07/28/21 0813  AST 15 34 33  ALT _2 ALKPHOS 89 111 89  BILITOT 0.3 0.5 0.5  PROT 4.8* 5.8* 4.9*  ALBUMIN 1.3* 1.5* 1.1*     CBG: Recent Labs  Lab 07/21/21 1609 07/22/21 0021 07/22/21 0345  GLUCAP 109* 117* 109*    Recent Results (from the past 240 hour(s))  MRSA Next Gen by PCR, Nasal     Status: None   Collection Time: 07/18/21  8:51 PM   Specimen: Nasal Mucosa; Nasal Swab  Result Value Ref Range Status   MRSA by PCR Next Gen NOT DETECTED NOT DETECTED Final    Comment: (NOTE) The GeneXpert MRSA Assay (FDA approved for NASAL specimens only), is one component of a comprehensive MRSA colonization surveillance program. It is not intended to diagnose MRSA infection nor to guide or monitor treatment for MRSA infections. Test performance is not  FDA approved in patients less than 59 years old. Performed at [facility], [place] [address] [place], [place] [address]       Studies: DG Chest Port 1 View  Result Date: 07/27/2021 CLINICAL DATA:  PICC line placement EXAM: PORTABLE CHEST 1 VIEW COMPARISON:  07/19/2021, 07/18/2021, FINDINGS: Interval right upper extremity central venous catheter with tip visible over SVC. Small left pleural effusion with slight increased left basilar consolidation. Cardiomegaly with mild central congestion. No pneumothorax. Previously noted left sided central venous catheter has been removed IMPRESSION: 1. Removal of left-sided central venous catheter. New right upper extremity central venous catheter with tip over the SVC. 2. Cardiomegaly with vascular congestion, small left effusion and left basilar consolidation. Electronically Signed   By: Donavan Foil M.D.   On: 07/27/2021 18:20   [place] EKG SITE RITE  Result Date: 07/27/2021 If Site Rite image not attached, placement could not be confirmed due to current cardiac rhythm.     Bonnielee Haff, MD  [facility] 07/28/2021  If 7PM-7AM, please contact night-coverage

## 2021-07-28 NOTE — Progress Notes (Signed)
PT Cancellation Note  Patient Details Name: Lauren [name] MRN: [id number] DOB: 1946/10/02   Cancelled Treatment:    Reason Eval/Treat Not Completed: Pain limiting ability to participate Pt just worked with OT and reporting increased hip pain. Requesting to hold on PT for today. Will follow up as schedule allows.   Lou Miner, DPT  Acute Rehabilitation Services  Pager: [phone] Office: [phone]    Rudean Hitt 07/28/2021, 9:26 AM

## 2021-07-28 NOTE — Evaluation (Signed)
Occupational Therapy Evaluation Patient Details Name: Lauren [name] MRN: [id number] DOB: [birth year] Today's Date: 07/28/2021    History of Present Illness 75 yo female with c/o constipation and rectal pain. 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 Pt s/p R hemicolectomy 07/06/21. PMH: a fib, morbid obesity, diastolic CHF, hypertension, lymphedema   Clinical Impression   Patient admitted for the diagnosis above.  PTA she was walking without an AD, lives alone, and was able to complete ADL/IADL with increased time and effort.  She does not drive.  Barriers are listed below.  Currently she is essentially bed bound, and near total assist for ADL completion at bed level.  OT to trial 1x/wk for low level goals as plan of care continues to be determined.  Goals and frequency can be adjusted as needed.  SNF is recommended post acute given deficits.      Follow Up Recommendations  SNF    Equipment Recommendations  Wheelchair (measurements OT);Wheelchair cushion (measurements OT)    Recommendations for Other Services       Precautions / Restrictions Precautions Precautions: Fall Precaution Comments: nasuea/vomiting/ painful L hip Restrictions Weight Bearing Restrictions: No      Mobility Bed Mobility Overal bed mobility: Needs Assistance Bed Mobility: Rolling Rolling: Total assist         General bed mobility comments: unable to attempt due to pain Patient Response: Restless;Anxious  Transfers                                                                 ADL either performed or assessed with clinical judgement   ADL       Grooming: Wash/dry hands;Wash/dry face;Set up;Bed level   Upper Body Bathing: Bed level;Moderate assistance   Lower Body Bathing: Total assistance;Bed level   Upper Body Dressing : Moderate assistance;Bed level   Lower Body Dressing: Total assistance;Bed level       Toileting-  Clothing Manipulation and Hygiene: Total assistance;Bed level               Vision Baseline Vision/History: 0 No visual deficits Patient Visual Report: No change from baseline                  Pertinent Vitals/Pain Faces Pain Scale: Hurts even more Pain Location: L hip Pain Descriptors / Indicators: Sharp Pain Intervention(s): Monitored during session     Hand Dominance Right   Extremity/Trunk Assessment Upper Extremity Assessment Upper Extremity Assessment: Generalized weakness   Lower Extremity Assessment Lower Extremity Assessment: Generalized weakness;LLE deficits/detail RLE Deficits / Details: ankle AROM WNL, knee and hip AROM ~50%, grossly 3+/5, lymphedema noted throughout entire LE, denies numbness/tingling LLE Deficits / Details: ankle AROM WNL, knee and hip AROM ~25%, grossly 3/5, lymphedema noted throughout entire LE, denies numbness/tingling.  Unable to flex knee due to hip pain       Communication Communication Communication: No difficulties   Cognition Arousal/Alertness: Awake/alert Behavior During Therapy: Restless;Agitated;Anxious Overall Cognitive Status: No family/caregiver present to determine baseline cognitive functioning                                 General Comments: answering questions appropriately, but confused as to why  she cannot eat/drink, current medical condition.                    Home Living Family/patient expects to be discharged to:: Private residence Living Arrangements: Alone Available Help at Discharge: Friend(s);Available PRN/intermittently Type of Home: House Home Access: Stairs to enter [employer] of Steps: 1 Entrance Stairs-Rails: None Home Layout: One level     Bathroom Shower/Tub: Technical sales engineer: Yes How Accessible: Accessible via walker Home Equipment: None   Additional Comments: Pt has a lift chair/recliner      Prior Functioning/Environment  Level of Independence: Needs assistance  Gait / Transfers Assistance Needed: Pt reports ambulates household distances independently, able to complete transfers with increased time ADL's / Homemaking Assistance Needed: Pt reports indpendent with microwave meals, sponge bath, dressing, light cleaning.   Comments: pt reports friend groceries shops, uses EMS/cab for transportation        OT Problem List: Decreased strength;Decreased activity tolerance;Impaired balance (sitting and/or standing);Obesity;Pain      OT Treatment/Interventions: Self-care/ADL training;Therapeutic exercise;Therapeutic activities;Balance training    OT Goals(Current goals can be found in the care plan section) Acute Rehab OT Goals Patient Stated Goal: I want something to drink OT Goal Formulation: With patient Time For Goal Achievement: 08/11/21 Potential to Achieve Goals: Good  OT Frequency: Min 1X/week   Barriers to D/C: Decreased caregiver support          Co-evaluation              AM-PAC OT "6 Clicks" Daily Activity     Outcome Measure Help from another person eating meals?: Total Help from another person taking care of personal grooming?: A Little Help from another person toileting, which includes using toliet, bedpan, or urinal?: Total Help from another person bathing (including washing, rinsing, drying)?: A Lot Help from another person to put on and taking off regular upper body clothing?: A Lot Help from another person to put on and taking off regular lower body clothing?: Total 6 Click Score: 10   End of Session Nurse Communication: Patient requests pain meds  Activity Tolerance: Patient limited by pain Patient left: in bed;with call bell/phone within reach  OT Visit Diagnosis: Other abnormalities of gait and mobility (R26.89);Muscle weakness (generalized) (M62.81);Other symptoms and signs involving cognitive function;Pain;Adult, failure to thrive (R62.7) Pain - Right/Left: Left Pain -  part of body: Hip                Time: [id number] OT Time Calculation (min): 16 min Charges:  OT General Charges $OT Visit: 1 Visit OT Evaluation $OT Eval Moderate Complexity: 1 Mod  07/28/2021  RP, OTR/L  Acute Rehabilitation Services  Office:  [phone]   Metta Clines 07/28/2021, 9:14 AM

## 2021-07-28 NOTE — Progress Notes (Signed)
Progress Note  Patient Name: Lauren [name] Date of Encounter: 07/28/2021  Primary Cardiologist: Carlyle Dolly, MD   Subjective   Appears quite ill, says she "just wants to rest".  Inpatient Medications    Scheduled Meds:  Chlorhexidine Gluconate Cloth  6 each Topical Daily   enoxaparin (LOVENOX) injection  120 mg Subcutaneous Q12H   levothyroxine  50 mcg Intravenous Daily   mouth rinse  15 mL Mouth Rinse BID   pantoprazole (PROTONIX) IV  40 mg Intravenous Daily   sodium chloride flush  10-40 mL Intracatheter Q12H   Continuous Infusions:  magnesium sulfate bolus IVPB 2 g (07/28/21 0954)   methocarbamol (ROBAXIN) IV 500 mg (07/25/21 2343)   piperacillin-tazobactam (ZOSYN)  IV 3.375 g (07/28/21 0604)   potassium chloride 10 mEq (07/28/21 0959)   TPN ADULT (ION) 105 mL/hr at 07/27/21 1916   TPN ADULT (ION)     PRN Meds: diphenhydrAMINE, haloperidol lactate, HYDROmorphone (DILAUDID) injection **OR** [DISCONTINUED]  HYDROmorphone (DILAUDID) injection, methocarbamol (ROBAXIN) IV, ondansetron (ZOFRAN) IV, sodium chloride flush, sodium chloride flush   Vital Signs    Vitals:   07/27/21 2009 07/27/21 2052 07/28/21 0000 07/28/21 0319  BP: (!) 103/52 93/66 110/79 (!) 118/52  Pulse:  91 95 77  Resp:  20 (!) 22 20  Temp:  98.1 F (36.7 C) (!) 97.5 F (36.4 C) 98.1 F (36.7 C)  TempSrc:  Axillary Axillary Oral  SpO2:  100% 98% 99%  Weight:      Height:        Intake/Output Summary (Last 24 hours) at 07/28/2021 1008 Last data filed at 07/28/2021 0319 Gross per 24 hour  Intake 838.83 ml  Output 200 ml  Net 638.83 ml   Filed Weights   07/25/21 0307 07/26/21 0340 07/26/21 1333  Weight: 124.3 kg 126.8 kg 123.7 kg    Telemetry    Afib rates 150s-160s - Personally Reviewed  ECG    No new - Personally Reviewed  Physical Exam   GEN: appears ill and frail, pale Cardiac: irregular and tachycardic Respiratory: shallow breath sounds GI: Soft MS: Diffuse edema Neuro:   moves all extremities Psych: appears in despair  Labs    Chemistry Recent Labs  Lab 07/23/21 0842 07/26/21 0615 07/28/21 0813  NA 138 137 140  K 4.6 4.1 3.7  CL 108 108 111  CO2 '27 24 26  '$ GLUCOSE 97 94 117*  BUN '18 18 13  '$ CREATININE 0.52 0.56 0.59  CALCIUM 7.6* 8.0* 7.7*  PROT 4.8* 5.8* 4.9*  ALBUMIN 1.3* 1.5* 1.1*  AST 15 34 33  ALT '11 17 18  '$ ALKPHOS 89 111 89  BILITOT 0.3 0.5 0.5  GFRNONAA >60 >60 >60  ANIONGAP 3* 5 3*     Hematology Recent Labs  Lab 07/26/21 0615 07/27/21 0951 07/28/21 0813  WBC 13.1* 9.7 14.4*  RBC 3.14* 3.83* 2.24*  HGB 7.2* 8.7* 5.2*  HCT 24.4* 30.5* 18.2*  MCV 77.7* 79.6* 81.3  MCH 22.9* 22.7* 23.2*  MCHC 29.5* 28.5* 28.6*  RDW 30.2* 31.1* 30.5*  PLT 225 144* 167    Cardiac EnzymesNo results for input(s): TROPONINI in the last 168 hours. No results for input(s): TROPIPOC in the last 168 hours.   BNPNo results for input(s): BNP, PROBNP in the last 168 hours.   DDimer No results for input(s): DDIMER in the last 168 hours.   Radiology    DG Chest Port 1 View  Result Date: 07/27/2021 CLINICAL DATA:  PICC line placement EXAM:  PORTABLE CHEST 1 VIEW COMPARISON:  07/19/2021, 07/18/2021, FINDINGS: Interval right upper extremity central venous catheter with tip visible over SVC. Small left pleural effusion with slight increased left basilar consolidation. Cardiomegaly with mild central congestion. No pneumothorax. Previously noted left sided central venous catheter has been removed IMPRESSION: 1. Removal of left-sided central venous catheter. New right upper extremity central venous catheter with tip over the SVC. 2. Cardiomegaly with vascular congestion, small left effusion and left basilar consolidation. Electronically Signed   By: Donavan Foil M.D.   On: 07/27/2021 18:20   [place] EKG SITE RITE  Result Date: 07/27/2021 If Site Rite image not attached, placement could not be confirmed due to current cardiac rhythm.   Cardiac Studies   None  this admission  Patient Profile     75 y.o. female 75 year old female with permanent atrial fibrillation, systolic heart failure (EF 30%, diagnosed [facility] earlier this month), recently diagnosed metastatic colon cancer, anemia, lymphedema, morbid obesity who was admitted to the hospital on 07/18/2021 with hypotension and septic shock secondary to coloncutaneous fistula. Also found to be COVID positive. Cardiology was consulted on 07/27/2021 for atrial fibrillation with RVR and hypotension.   Assessment & Plan   Principal Problem:   Septic shock ([place]) Active Problems:   Hypokalemia   Morbid obesity with BMI of 50.0-59.9, adult (HCC)   Colon carcinoma ([place])   Enterocutaneous fistula   Cellulitis   AKI (acute kidney injury) ([place])   Hyponatremia   Afib with RVR, likely permanent -Currently admitted to the hospital with septic shock secondary to colocutaneous fistula after recent right hemicolectomy for newly diagnosed metastatic colon cancer.  Course is also complicated by anemia. - Hb 5.2 today.  - antibiotics for fistula. -atrial fibrillation with RVR likely secondary to continued infection and persistent anemia. -Given her low EF and hypotension would avoid beta-blockers and calcium channel blockers for now. - Rates still poorly controlled. Will use IV amiodarone. -She is currently on therapeutic Lovenox, may need to hold in setting of worsening anemia. D/w primary team, stat H+H will be ordered and plan for transfusion if Hemoglobin <7.   2. Systolic heart failure, EF 30% -Newly diagnosed at [facility] with an EF of 30%.  - Cardiomyopathy may be related to critical illness, afib is chronic.  She has had extended admission for septic shock and recurrent infection. - colocutaneous fistula secondary to her recent right hemicolectomy. -Given metastatic cancer, current ill state, recurrent anemia she is not a candidate for invasive or aggressive cardiovascular care. -She is  currently DNR/DNI.  Recommend continued palliative care involvement. Suspect hospice most appropriate.  Discussed with primary IM, will start amiodarone today and will assess in short term 24-48 hours.     For questions or updates, please contact [place] Please consult [email or web address] for contact info under        Signed, Elouise Munroe, MD  07/28/2021, 10:08 AM

## 2021-07-29 ENCOUNTER — Inpatient Hospital Stay (HOSPITAL_COMMUNITY): Payer: Medicare HMO

## 2021-07-29 DIAGNOSIS — Z66 Do not resuscitate: Secondary | ICD-10-CM | POA: Diagnosis not present

## 2021-07-29 DIAGNOSIS — K632 Fistula of intestine: Secondary | ICD-10-CM | POA: Diagnosis not present

## 2021-07-29 DIAGNOSIS — D62 Acute posthemorrhagic anemia: Secondary | ICD-10-CM

## 2021-07-29 DIAGNOSIS — S7012XA Contusion of left thigh, initial encounter: Secondary | ICD-10-CM | POA: Diagnosis not present

## 2021-07-29 DIAGNOSIS — A419 Sepsis, unspecified organism: Secondary | ICD-10-CM | POA: Diagnosis not present

## 2021-07-29 DIAGNOSIS — Z7189 Other specified counseling: Secondary | ICD-10-CM | POA: Diagnosis not present

## 2021-07-29 DIAGNOSIS — R6521 Severe sepsis with septic shock: Secondary | ICD-10-CM | POA: Diagnosis not present

## 2021-07-29 DIAGNOSIS — I4891 Unspecified atrial fibrillation: Secondary | ICD-10-CM | POA: Diagnosis not present

## 2021-07-29 DIAGNOSIS — Z515 Encounter for palliative care: Secondary | ICD-10-CM | POA: Diagnosis not present

## 2021-07-29 LAB — MAGNESIUM: Magnesium: 2.1 mg/dL (ref 1.7–2.4)

## 2021-07-29 LAB — BASIC METABOLIC PANEL
Anion gap: 0 — ABNORMAL LOW (ref 5–15)
BUN: 17 mg/dL (ref 8–23)
CO2: 28 mmol/L (ref 22–32)
Calcium: 7.6 mg/dL — ABNORMAL LOW (ref 8.9–10.3)
Chloride: 112 mmol/L — ABNORMAL HIGH (ref 98–111)
Creatinine, Ser: 0.47 mg/dL (ref 0.44–1.00)
GFR, Estimated: >60 mL/min (ref >60)
Glucose, Bld: 115 mg/dL — ABNORMAL HIGH (ref 70–99)
Potassium: 3.7 mmol/L (ref 3.5–5.1)
Sodium: 140 mmol/L (ref 135–145)

## 2021-07-29 LAB — HEMOGLOBIN AND HEMATOCRIT, BLOOD
HCT: 22.2 % — ABNORMAL LOW (ref 36.0–46.0)
Hemoglobin: 7 g/dL — ABNORMAL LOW (ref 12.0–15.0)

## 2021-07-29 LAB — CBC
HCT: 22.1 % — ABNORMAL LOW (ref 36.0–46.0)
Hemoglobin: 6.8 g/dL — CL (ref 12.0–15.0)
MCH: 25.6 pg — ABNORMAL LOW (ref 26.0–34.0)
MCHC: 30.8 g/dL (ref 30.0–36.0)
MCV: 83.1 fL (ref 80.0–100.0)
Platelets: 164 10*3/uL (ref 150–400)
RBC: 2.66 MIL/uL — ABNORMAL LOW (ref 3.87–5.11)
RDW: 26.7 % — ABNORMAL HIGH (ref 11.5–15.5)
WBC: 15.4 10*3/uL — ABNORMAL HIGH (ref 4.0–10.5)
nRBC: 0.4 % — ABNORMAL HIGH (ref 0.0–0.2)

## 2021-07-29 LAB — T4, FREE: Free T4: 1.01 ng/dL (ref 0.61–1.12)

## 2021-07-29 LAB — PREPARE RBC (CROSSMATCH)

## 2021-07-29 LAB — HEMOGLOBIN A1C
Hgb A1c MFr Bld: 5.4 % (ref 4.8–5.6)
Mean Plasma Glucose: 108.28 mg/dL

## 2021-07-29 MED ORDER — SODIUM CHLORIDE 0.9% IV SOLUTION: Freq: Once | INTRAVENOUS | Status: AC

## 2021-07-29 MED ORDER — IOHEXOL 350 MG/ML SOLN
100.0000 mL | Freq: Once | INTRAVENOUS | Status: AC | PRN
  Administered 2021-07-29: 100 mL via INTRAVENOUS

## 2021-07-29 MED ORDER — TRAVASOL 10 % IV SOLN
INTRAVENOUS | Status: AC
  Filled 2021-07-29: qty 1512

## 2021-07-29 MED ORDER — POTASSIUM CHLORIDE 10 MEQ/50ML IV SOLN
10.0000 meq | INTRAVENOUS | Status: AC
  Administered 2021-07-29 (×3): 10 meq via INTRAVENOUS
  Filled 2021-07-29 (×3): qty 50

## 2021-07-29 MED ORDER — FUROSEMIDE 10 MG/ML IJ SOLN
20.0000 mg | Freq: Once | INTRAMUSCULAR | Status: AC
  Administered 2021-07-29: 20 mg via INTRAVENOUS
  Filled 2021-07-29: qty 2

## 2021-07-29 MED ORDER — ACETAMINOPHEN 10 MG/ML IV SOLN
1000.0000 mg | Freq: Four times a day (QID) | INTRAVENOUS | Status: AC
  Administered 2021-07-29 – 2021-07-30 (×3): 1000 mg via INTRAVENOUS
  Filled 2021-07-29 (×4): qty 100

## 2021-07-29 NOTE — Progress Notes (Signed)
PROGRESS NOTE  Lauren [name] PJK:[id number] DOB: June 04, 1946 DOA: 07/18/2021 PCP: Pcp, No   LOS: 11 days   Brief narrative: Patient is 75 years old female with past medical history of atrial fibrillation, combined heart failure, hypertension, lymphedema, recent admission to the hospital for GI bleed secondary to cecal mass status post right hemicolectomy on 07/06/2021 at outside hospital.  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  She was also found to be COVID positive.  Patient was then brought in hospital and received IV fluids vasopressors antibiotic.  CT scan of the abdomen showed the fistula extending from the bowel adjacent to the suture through an umbilical hernia into the abdominal wall with fluid collection in the anterior abdominal wall and gas tracking.  Patient was admitted to the ICU on 07/18/2021 and was started on vasopressors and antibiotic.  On 07/19/21 patient was started on TPN and was taken off of Levophed.  Subsequently, patient was considered stable for transfer out of ICU.   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  T3 N2 M0 status.  CT scan showing some fistula with abdominal wall collection.  Initially thought to be abscess but when the midline wound was opened stool was noted to be present. General surgery continues to follow.  Patient was placed on bowel rest and TPN was initiated.  Remains on Zosyn. WBC remains elevated.  Patient remains on Zosyn.  CT scan of the abdomen done this morning does not show any new fluid collection for the left-sided thigh hematoma.  No further episodes of fever noted. Patient remains NPO.  Remains on TPN.  PICC line was replaced on 9/2 after the patient  pulled it out on 1/1.  Electrolytes being addressed by pharmacy.  Remains on Dilaudid for pain control. We will request medical oncology to see the patient so that they can provide information regarding prognosis and suitability for chemotherapy etc.,  So that we can come up with an appropriate long-term plan for this patient. One lymph node was positive on pathology raising concern for metastatic process.  Acute blood loss anemia  Hemoglobin yesterday noted to be between 4 and 5.  Initially thought to be dilutional since no overt bleeding was noted however multiple repeat testing continues to show a low hemoglobin.  Blood transfusion was ordered.  Initially given 2 units of PRBC.  1/3 unit ordered this morning.  CT scan of the abdomen pelvis was also done which revealed left thigh hematoma which would explain the drop in hemoglobin.  We will recheck hemoglobin later today.    Left thigh hematoma/pain in the left hip area Noted to have hematoma in the left upper thigh muscles as well as possibly in the left psoas muscle.  Pain medications as needed.  Will hold anticoagulation.  Discussed with orthopedics.  They will review the films but do not anticipate any surgical intervention.  They do recommend holding anticoagulation.  Permanent atrial fibrillation Patient was on Xarelto as outpatient.  Was switched over to Lovenox here in the hospital.  Heart rate remain poorly controlled despite being on metoprolol initially and subsequently on Cardizem infusion.  Subsequently cardiology was consulted due to poorly controlled heart rate and low blood pressures.  Started on amiodarone.  Was given digoxin as well.  Cardiology  continues to follow.  Chronic systolic CHF Care everywhere was reviewed.  She had an echocardiogram in August 2022 at [facility] which showed EF to be 30%. Continue to monitor volume status.  CT scan of the abdomen does raise concern for anasarca. Per cardiology patient is not a candidate for  invasive or aggressive cardiovascular care.  Septic shock  Patient seems to be stable from a sepsis standpoint.  Has been off of vasopressors.  Remains on Zosyn.  Cultures have been negative.    Acute kidney injury Resolved.   Acute delirium Appears to have improved.  Continue Haldol as needed.     Incidental COVID 19 positive. Completed remdesivir.  On room air.   Hyponatremia/hypokalemia/hypomagnesemia Sodium is stable.  Potassium, magnesium and phosphorus being addressed by pharmacy.   Goals of care Palliative care met with the patient and spoke to patient's brother who is the next of kin.  Patient does have a daughter but they are not in touch with each other.  Patient was changed over to DNR/DNI based on this conversation.    DVT prophylaxis: On Lovenox therapeutic  Code Status: Full code  Family Communication:  No family at bedside.  Patient's brother being updated periodically.  We will update him today.  Status is: Inpatient  Remains inpatient appropriate because: IV treatments appropriate due to intensity of illness or inability to take PO, and Inpatient level of care appropriate due to severity of illness  Dispo: The patient is from: SNF              Anticipated d/c is to: SNF              Patient currently is not medically stable to d/c.   Difficult to place patient No   Consultants: General surgery PCCM  Procedures: PICC line placement  Anti-infectives:  Remdesivir- completed Zosyn IV   Subjective: Patient noted to be anxious this morning.  Denies any chest pain or shortness of breath.  Abdominal pain is stable.   Objective: Vitals:   07/29/21 0647 07/29/21 0800  BP: 125/74 (!) 146/83  Pulse: (!) 109 92  Resp: (!) 21 (!) 21  Temp: 98.1 F (36.7 C) 98.1 F (36.7 C)  SpO2: 97%     Intake/Output Summary (Last 24 hours) at 07/29/2021 1052 Last data filed at 07/29/2021 0536 Gross per 24 hour  Intake 5077.19 ml  Output 900 ml  Net 4177.19 ml     Filed Weights   07/25/21 0307 07/26/21 0340 07/26/21 1333  Weight: 124.3 kg 126.8 kg 123.7 kg   Body mass index is 49.88 kg/m.   Physical Exam:  General appearance: Awake alert.  In no distress.  Obese Resp: Clear to auscultation bilaterally.  Normal effort Cardio: S1-S2 is normal regular.  No S3-S4.  No rubs murmurs or bruit GI: Abdomen is soft.  Stool draining into Eakin pouch.  Midline wound noted. Extremities: Mild edema bilateral lower extremities.  Limited range of motion of the left leg. Neurologic:  No focal neurological deficits.     Data Review: I have personally reviewed the following laboratory data and studies,  CBC: Recent Labs  Lab 07/27/21 0951 07/28/21 0813 07/28/21 1114 07/28/21 1345 07/29/21 0500  WBC 9.7 14.4* 8.1 16.2* 15.4*  HGB 8.7* 5.2* 4.0* 5.4* 6.8*  HCT 30.5* 18.2* 12.7* 19.4* 22.1*  MCV 79.6* 81.3 100.0 83.3 83.1  PLT 144* 167 106* 171 893    Basic Metabolic Panel: Recent Labs  Lab 07/23/21 0842 07/26/21 0615 07/27/21  7673 07/28/21 0813 07/29/21 0500  NA 138 137  --  140 140  K 4.6 4.1  --  3.7 3.7  CL 108 108  --  111 112*  CO2 27 24  --  26 28  GLUCOSE 97 94  --  117* 115*  BUN 18 18  --  13 17  CREATININE 0.52 0.56  --  0.59 0.47  CALCIUM 7.6* 8.0*  --  7.7* 7.6*  MG 2.0 1.6* 1.9  --  2.1  PHOS 2.5 3.3  --   --   --     Liver Function Tests: Recent Labs  Lab 07/23/21 0842 07/26/21 0615 07/28/21 0813  AST 15 34 33  ALT 11 17 18   ALKPHOS 89 111 89  BILITOT 0.3 0.5 0.5  PROT 4.8* 5.8* 4.9*  ALBUMIN 1.3* 1.5* 1.1*        Studies: CT ABDOMEN PELVIS W CONTRAST  Result Date: 07/29/2021 CLINICAL DATA:  Inpatient. Abdominal pain. Concern for abscess. Hemoglobin drop. Right hemicolectomy 07/06/2021 for colon cancer. EXAM: CT ABDOMEN AND PELVIS WITH CONTRAST TECHNIQUE: Multidetector CT imaging of the abdomen and pelvis was performed using the standard protocol following bolus administration of intravenous contrast.  CONTRAST:  117m OMNIPAQUE IOHEXOL 350 MG/ML SOLN COMPARISON:  07/18/2021 CT abdomen/pelvis. FINDINGS: Lower chest: Mild-to-moderate bibasilar atelectasis with small dependent bilateral pleural effusions, left greater than right. Hepatobiliary: Mildly heterogeneous liver parenchymal enhancement without discrete liver masses. Slightly irregular liver surface, cannot exclude cirrhosis. Cholecystectomy. Bile ducts are stable and within normal post cholecystectomy limits with CBD diameter 7 mm. Pancreas: Normal, with no mass or duct dilation. Spleen: Normal size. No mass. Adrenals/Urinary Tract: Normal adrenals. No hydronephrosis. Subcentimeter hypodense interpolar left renal cortical lesion is too small to characterize and unchanged, requiring no follow-up. No suspicious renal masses. Normal bladder. Stomach/Bowel: Normal non-distended stomach. Status post right hemicolectomy with intact appearing ileocolic anastomosis in the right abdomen. No dilated or thick-walled small bowel loops. Moderate sigmoid diverticulosis. No large bowel wall thickening or significant pericolonic fat stranding in the remnant large-bowel. Vascular/Lymphatic: Atherosclerotic nonaneurysmal abdominal aorta. Patent portal, splenic, hepatic and renal veins. No pathologically enlarged lymph nodes in the abdomen or pelvis. Reproductive: No adnexal masses.  Stable mildly enlarged uterus. Other: No pneumoperitoneum. Extensive subcutaneous emphysema throughout the bilateral ventral abdominopelvic wall, right greater than left, decreased in the interval. Worsened anasarca. No discrete drainable superficial fluid collection. No intraperitoneal fluid collections. Musculoskeletal: No aggressive appearing focal osseous lesions. Marked lumbar spondylosis. New heterogeneous hyperdense collection in the medial upper left thigh muscles (series 3/image 103) spanning up to 14.1 x 9.4 cm. New mild asymmetric enlargement and heterogeneous low-attenuation within  the left psoas muscle (series 3/image 50). IMPRESSION: 1. New heterogeneous hyperdense collection in the medial upper left thigh muscles, spanning up to 14.1 x 9.4 cm, compatible with intramuscular hematoma. 2. New mild asymmetric enlargement and heterogeneous low-attenuation within the left psoas muscle, suspect a subacute intramuscular hematoma. 3. No intraperitoneal collections. Ileocolic anastomosis appears intact in the right abdomen status post right hemicolectomy. No bowel obstruction. 4. Extensive subcutaneous emphysema throughout the bilateral ventral abdominopelvic wall, right greater than left, decreased in the interval. No discrete drainable superficial fluid collection. 5. Worsened anasarca. Small dependent bilateral pleural effusions, left greater than right. 6. Mildly irregular liver surface, cannot exclude cirrhosis. No discrete liver masses. 7. Moderate sigmoid diverticulosis. 8. Aortic Atherosclerosis (ICD10-I70.0). Electronically Signed   By: JIlona SorrelM.D.   On: 07/29/2021 08:33   DG Chest Port 1  View  Result Date: 07/27/2021 CLINICAL DATA:  PICC line placement EXAM: PORTABLE CHEST 1 VIEW COMPARISON:  07/19/2021, 07/18/2021, FINDINGS: Interval right upper extremity central venous catheter with tip visible over SVC. Small left pleural effusion with slight increased left basilar consolidation. Cardiomegaly with mild central congestion. No pneumothorax. Previously noted left sided central venous catheter has been removed IMPRESSION: 1. Removal of left-sided central venous catheter. New right upper extremity central venous catheter with tip over the SVC. 2. Cardiomegaly with vascular congestion, small left effusion and left basilar consolidation. Electronically Signed   By: Donavan Foil M.D.   On: 07/27/2021 18:20      Bonnielee Haff, MD  [facility] 07/29/2021  If 7PM-7AM, please contact night-coverage

## 2021-07-29 NOTE — Progress Notes (Signed)
PHARMACY - TOTAL PARENTERAL NUTRITION CONSULT NOTE  Indication: Fistula  Patient Measurements: Height: '5\' 2"'$  (157.5 cm) Weight: 123.7 kg (272 lb 11.3 oz) IBW/kg (Calculated) : 50.1 TPN AdjBW (KG): 66.6 Body mass index is 49.88 kg/m.  Assessment:  25 YOF with recent R-hemicolectomy on 8/12 in the setting of a cecal mass - consistent with R-colon metastatic adenocarcinoma. 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 Pharmacy consulted to manage TPN for nutritional support in the setting of a colocutaneous fistula.     Glucose / Insulin: CBGs <120, off SSI 07/22/21 Electrolytes: all WNL except K 3.7 (goal >/=2 for Afib), Mag 2.1 (goal >/= 2 for Afib), CL mild high Renal:  SCr < 1, BUN WNL Hepatic: LFTs / tibili / TG WNL, albumin 1.3 Intake / Output; MIVF:  UOP 953m, ostomy 043m  GI Imaging: 8/24 Abd CT: colocutaneous fistula, 7.4cm abd wall abscess, soft tissue edema, cellulitis vs nec fasciitis 8/25 Abd xray: diffuse abd wall emphysema, nonobstructive bowel gas pattern 9/4 CT abd/pelvis: medial upper left thigh hematoma GI Surgeries / Procedures:  8/12 (last admit): R-hemicolectomy  Central access: PICC replaced 07/27/21 TPN start date: 07/19/21  Nutritional Goals (RD assessment on 8/25): 2300-2500 kcal, 140-160g AA, fluid >/= 2L per day  Current Nutrition:  TPN  Plan:  Continue TPN at goal rate of 105 mL/hr, providing 151g AA and 2373 kCal meeting 100% of estimated needs Electrolytes in TPN: Na 125 mEq/L, increase K to 4018mL, Ca 4mE35m, increase Mg to 11mE31m Phos 22mmo63m Cl:Ac 1:1 Add standard MVI and trace elements to TPN Standard TPN labs Mon/Thurs  KCl 10 meq IV q1h x3  Thank you for involving pharmacy in this patient's care.  JennifRenold GentamD, BCPS Clinical Pharmacist Clinical phone for 07/29/2021 until 3p is [phone] 7:04 AM  **Pharmacist phone directory can be found on [email or web address]  under [email or web address]

## 2021-07-29 NOTE — Progress Notes (Addendum)
Daily Progress Note   Patient Name: Lauren [name]       Date: 07/29/2021 DOB: Apr 23, 1946  Age: 75 y.o. MRN#: [id number] Attending Physician: Bonnielee Haff, MD Primary Care Physician: Pcp, No Admit Date: 07/18/2021  Reason for Consultation/Follow-up: Establishing goals of care  Subjective: Chart Reviewed. Overnight, Marggie remained to have a low Hgb. The overnight covering MD ordered an additional unit of PRBC's and changed her CT scan order to stat. As of this morning it has still not yet been completed.  On exam, Lauren [name] is anxious. She expresses a lack of comprehension in terms of her clinical situation and perseverates on when she is going to eat something. She shares that she "needs water and chips". I reviewed with her that she had a cecal mass requiring a significant surgery of the right side of her abdomen which is why she has not been able to eat anything. We reviewed that she is being fed through the IV tube in her arm. This was retained for a very short period of time prior to her asking again why she could not eat anything.  Lauren [name] complaints of ongoing spasm like pain in her left abdomen down her leg. She shares that this was ongoing overnight and is terribly uncomfortable. We reviewed that she will get a scan of her abdomen this morning which may provide some clarity on her pain. In the meanwhile I shared that medications will be provided to achieve better control of it.   I called and updated patients brother, Legrand Como. He is interested to further know what the CT abd/pelvis reveal though he shares understanding of Lauren [name] overall poor state.   All questions answered and support provided.   Length of Stay: 11 days  Vital Signs: BP 125/74   Pulse (!) 109   Temp 98.1 F (36.7 C) (Axillary)   Resp (!) 21   Ht '5\' 2"'$  (1.575 m)   Wt 123.7 kg   LMP 09/13/2016   SpO2 97%   BMI 49.88 kg/m  SpO2: SpO2: 97 % O2 Device: O2 Device: Room Air O2 Flow Rate:    Physical  Exam: Gen: ill appearing  CV: Tachycardic, irregularly irregular  Abd: Colostomy in place,  L side is taught and painful to deep palpation           Palliative Care Assessment & Plan  HPI: Palliative Care consult requested for goals of care discussion in this 75 y.o. female  with past medical history of hypertension, combined CHF, atrial fibrillation (Xarelto), hypothyroidism, lymphedema, and obesity. She was admitted on [id number] from [facility] with complaints of nausea, abdominal pain, and hypotension. Patient was recently admitted for GI bleed secondary to cecal mass s/p right hemicolectomy (07/06/2021). During work-up found to be COVID positive. Patient requiring TPN. Has weaned off of vasopressors.     Code Status: DNR  Goals of Care/Recommendations: DNAR/DNI Patient is overall ill appearing. She is not showing improvement with concerns for inability to make a meaningful recovery.  Will await CT abdomen and pelvis prior to further updating patients brother though per today's conversations he is aware of Sharisa' tenuous clinical state Will trial IV tylenol x4 doses to see if there is improvement in pain, continue PRN dilaudid and robaxin PMT will continue to support and follow.   Prognosis: Guarded-Poor   Discharge Planning: To Be Determined  Thank you for allowing the Palliative Medicine Team to assist in the care of this patient.  Time Total: 35 min.  Visit consisted of counseling and education dealing with the complex and emotionally intense issues of symptom management and palliative care in the setting of serious and potentially life-threatening illness.Greater than 50%  of this time was spent counseling and coordinating care related to the above assessment and plan.  Tacey Ruiz, [facility] Palliative Medicine Team [phone]

## 2021-07-29 NOTE — Evaluation (Signed)
Physical Therapy Evaluation Patient Details Name: Lauren [name] MRN: [id number] DOB: 06-10-1946 Today's Date: 07/29/2021   History of Present Illness  75 yo female with c/o constipation and rectal pain. 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 Pt s/p R hemicolectomy 07/06/21. PMH: a fib, morbid obesity, diastolic CHF, hypertension, lymphedema  Clinical Impression   Pt admitted with above diagnosis. Lives at home alone, with family and friends' assistance for IADLs; Typically walks household distance without assistive device; Presents to PT with significant dependencies in functional mobility and ADLs; Session limited to bed level due to pain and +2 help unavailable; Will need post-acute rehab -- depending on medical prognosis -- which we will know more about after consulting Oncology;   Pt currently with functional limitations due to the deficits listed below (see PT Problem List). Pt will benefit from skilled PT to increase their independence and safety with mobility to allow discharge to the venue listed below.       Follow Up Recommendations SNF    Equipment Recommendations  Rolling walker with 5" wheels;3in1 (PT);Other (comment) (Bari sized)    Recommendations for Other Services       Precautions / Restrictions Precautions Precautions: Fall Precaution Comments: nasuea/vomiting/ painful L hip      Mobility  Bed Mobility Overal bed mobility: Needs Assistance Bed Mobility: Rolling Rolling: Total assist;Max assist         General bed mobility comments: Rolled to pt's right with heavy assist and pt pulling on bedrail; pt reports pain, but also fearful of falling out of the bed    Transfers                    Ambulation/Gait                Stairs            Wheelchair Mobility    Modified Rankin (Stroke Patients Only)       Balance                                              Pertinent Vitals/Pain Pain Assessment: Faces Faces Pain Scale: Hurts whole lot Pain Location: L hip Pain Descriptors / Indicators: Sharp Pain Intervention(s): Monitored during session    Home Living Family/patient expects to be discharged to:: Private residence Living Arrangements: Alone Available Help at Discharge: Friend(s);Available PRN/intermittently Type of Home: House Home Access: Stairs to enter Entrance Stairs-Rails: None Entrance Stairs-Number of Steps: 1 Home Layout: One level Home Equipment: None Additional Comments: Pt has a Conservation officer, nature    Prior Function Level of Independence: Needs assistance   Gait / Transfers Assistance Needed: Pt reports ambulates household distances independently, able to complete transfers with increased time  ADL's / Homemaking Assistance Needed: Pt reports indpendent with microwave meals, sponge bath, dressing, light cleaning.  Comments: pt reports friend groceries shops, uses EMS/cab for transportation     Hand Dominance   Dominant Hand: Right    Extremity/Trunk Assessment   Upper Extremity Assessment Upper Extremity Assessment: Defer to OT evaluation    Lower Extremity Assessment Lower Extremity Assessment: Generalized weakness;RLE deficits/detail;LLE deficits/detail RLE Deficits / Details: ankle AROM WNL, knee and hip AROM ~50%, grossly 3+/5, lymphedema noted throughout entire LE, denies numbness/tingling LLE Deficits / Details: ankle AROM WNL, knee and hip AROM ~25%, grossly 3/5, lymphedema noted throughout  entire LE, denies numbness/tingling.  Unable to flex knee due to hip pain       Communication   Communication: No difficulties  Cognition Arousal/Alertness: Awake/alert Behavior During Therapy: Restless;Agitated;Anxious Overall Cognitive Status: No family/caregiver present to determine baseline cognitive functioning                                 General Comments: answering questions  appropriately, but confused as to why she cannot eat/drink, current medical condition.      General Comments General comments (skin integrity, edema, etc.): HR up to 144 (observed Max; O2 sats in uper 90s on room air; BP 144/76 end of session    Exercises     Assessment/Plan    PT Assessment Patient needs continued PT services  PT Problem List Decreased strength;Decreased range of motion;Decreased activity tolerance;Decreased balance;Decreased mobility;Decreased knowledge of use of DME;Obesity;Decreased skin integrity;Pain       PT Treatment Interventions DME instruction;Gait training;Functional mobility training;Therapeutic activities;Therapeutic exercise;Balance training;Cognitive remediation;Patient/family education    PT Goals (Current goals can be found in the Care Plan section)  Acute Rehab PT Goals Patient Stated Goal: I want something to drink PT Goal Formulation: With patient Time For Goal Achievement: 08/12/21 Potential to Achieve Goals: Fair    Frequency Min 2X/week   Barriers to discharge        Co-evaluation               AM-PAC PT "6 Clicks" Mobility  Outcome Measure Help needed turning from your back to your side while in a flat bed without using bedrails?: A Lot Help needed moving from lying on your back to sitting on the side of a flat bed without using bedrails?: A Lot Help needed moving to and from a bed to a chair (including a wheelchair)?: Total Help needed standing up from a chair using your arms (e.g., wheelchair or bedside chair)?: Total Help needed to walk in hospital room?: Total Help needed climbing 3-5 steps with a railing? : Total 6 Click Score: 8    End of Session Equipment Utilized During Treatment: Other (comment) (bed pad) Activity Tolerance: Patient limited by pain Patient left: in bed;with call bell/phone within reach;with bed alarm set Nurse Communication: Mobility status PT Visit Diagnosis: Other abnormalities of gait and  mobility (R26.89);Muscle weakness (generalized) (M62.81)    Time: [id number] PT Time Calculation (min) (ACUTE ONLY): 21 min   Charges:   PT Evaluation $PT Eval Moderate Complexity: 1 Mod          Roney [place], [place]  Acute Rehabilitation Services Pager [phone] Office [phone]   Colletta [place] 07/29/2021, 4:52 PM

## 2021-07-29 NOTE — Progress Notes (Signed)
Progress Note  Patient Name: Lauren [name] Date of Encounter: 07/29/2021  Primary Cardiologist: Carlyle Dolly, MD   Subjective   Remains quite ill appearing.  Inpatient Medications    Scheduled Meds:  Chlorhexidine Gluconate Cloth  6 each Topical Daily   levothyroxine  50 mcg Intravenous Daily   mouth rinse  15 mL Mouth Rinse BID   pantoprazole (PROTONIX) IV  40 mg Intravenous Q12H   sodium chloride flush  10-40 mL Intracatheter Q12H   Continuous Infusions:  acetaminophen     amiodarone 30 mg/hr (07/29/21 0536)   methocarbamol (ROBAXIN) IV 500 mg (07/25/21 2343)   piperacillin-tazobactam (ZOSYN)  IV 3.375 g (07/29/21 0619)   potassium chloride     TPN ADULT (ION) 105 mL/hr at 07/29/21 0536   PRN Meds: diphenhydrAMINE, haloperidol lactate, HYDROmorphone (DILAUDID) injection **OR** [DISCONTINUED]  HYDROmorphone (DILAUDID) injection, methocarbamol (ROBAXIN) IV, ondansetron (ZOFRAN) IV, sodium chloride flush, sodium chloride flush   Vital Signs    Vitals:   07/29/21 0406 07/29/21 0632 07/29/21 0647 07/29/21 0800  BP: (!) 118/54 (!) 126/52 125/74 (!) 146/83  Pulse: 65 95 (!) 109 92  Resp: 18 18 (!) 21 (!) 21  Temp:  (!) 97.5 F (36.4 C) 98.1 F (36.7 C) 98.1 F (36.7 C)  TempSrc:  Axillary Axillary Oral  SpO2: 99% 100% 97%   Weight:      Height:        Intake/Output Summary (Last 24 hours) at 07/29/2021 1006 Last data filed at 07/29/2021 0536 Gross per 24 hour  Intake 5077.19 ml  Output 900 ml  Net 4177.19 ml   Filed Weights   07/25/21 0307 07/26/21 0340 07/26/21 1333  Weight: 124.3 kg 126.8 kg 123.7 kg    Telemetry    Afib rates 100-110- Personally Reviewed  ECG    No new - Personally Reviewed  Physical Exam   GEN: appears ill and frail, pale Cardiac: irregular and tachycardic Respiratory: shallow breath sounds GI: Soft MS: Diffuse edema Neuro:  moves all extremities Psych:  in pain  Labs    Chemistry Recent Labs  Lab 07/23/21 0842  07/26/21 0615 07/28/21 0813 07/29/21 0500  NA 138 137 140 140  K 4.6 4.1 3.7 3.7  CL 108 108 111 112*  CO2 '27 24 26 28  '$ GLUCOSE 97 94 117* 115*  BUN '18 18 13 17  '$ CREATININE 0.52 0.56 0.59 0.47  CALCIUM 7.6* 8.0* 7.7* 7.6*  PROT 4.8* 5.8* 4.9*  --   ALBUMIN 1.3* 1.5* 1.1*  --   AST 15 34 33  --   ALT '11 17 18  '$ --   ALKPHOS 89 111 89  --   BILITOT 0.3 0.5 0.5  --   GFRNONAA >60 >60 >60 >60  ANIONGAP 3* 5 3* 0*     Hematology Recent Labs  Lab 07/28/21 1114 07/28/21 1345 07/29/21 0500  WBC 8.1 16.2* 15.4*  RBC 1.27* 2.33* 2.66*  HGB 4.0* 5.4* 6.8*  HCT 12.7* 19.4* 22.1*  MCV 100.0 83.3 83.1  MCH 31.5 23.2* 25.6*  MCHC 31.5 27.8* 30.8  RDW 33.2* 30.7* 26.7*  PLT 106* 171 164    Cardiac EnzymesNo results for input(s): TROPONINI in the last 168 hours. No results for input(s): TROPIPOC in the last 168 hours.   BNPNo results for input(s): BNP, PROBNP in the last 168 hours.   DDimer No results for input(s): DDIMER in the last 168 hours.   Radiology    CT ABDOMEN PELVIS W CONTRAST  Result Date: 07/29/2021 CLINICAL DATA:  Inpatient. Abdominal pain. Concern for abscess. Hemoglobin drop. Right hemicolectomy 07/06/2021 for colon cancer. EXAM: CT ABDOMEN AND PELVIS WITH CONTRAST TECHNIQUE: Multidetector CT imaging of the abdomen and pelvis was performed using the standard protocol following bolus administration of intravenous contrast. CONTRAST:  181m OMNIPAQUE IOHEXOL 350 MG/ML SOLN COMPARISON:  07/18/2021 CT abdomen/pelvis. FINDINGS: Lower chest: Mild-to-moderate bibasilar atelectasis with small dependent bilateral pleural effusions, left greater than right. Hepatobiliary: Mildly heterogeneous liver parenchymal enhancement without discrete liver masses. Slightly irregular liver surface, cannot exclude cirrhosis. Cholecystectomy. Bile ducts are stable and within normal post cholecystectomy limits with CBD diameter 7 mm. Pancreas: Normal, with no mass or duct dilation. Spleen: Normal  size. No mass. Adrenals/Urinary Tract: Normal adrenals. No hydronephrosis. Subcentimeter hypodense interpolar left renal cortical lesion is too small to characterize and unchanged, requiring no follow-up. No suspicious renal masses. Normal bladder. Stomach/Bowel: Normal non-distended stomach. Status post right hemicolectomy with intact appearing ileocolic anastomosis in the right abdomen. No dilated or thick-walled small bowel loops. Moderate sigmoid diverticulosis. No large bowel wall thickening or significant pericolonic fat stranding in the remnant large-bowel. Vascular/Lymphatic: Atherosclerotic nonaneurysmal abdominal aorta. Patent portal, splenic, hepatic and renal veins. No pathologically enlarged lymph nodes in the abdomen or pelvis. Reproductive: No adnexal masses.  Stable mildly enlarged uterus. Other: No pneumoperitoneum. Extensive subcutaneous emphysema throughout the bilateral ventral abdominopelvic wall, right greater than left, decreased in the interval. Worsened anasarca. No discrete drainable superficial fluid collection. No intraperitoneal fluid collections. Musculoskeletal: No aggressive appearing focal osseous lesions. Marked lumbar spondylosis. New heterogeneous hyperdense collection in the medial upper left thigh muscles (series 3/image 103) spanning up to 14.1 x 9.4 cm. New mild asymmetric enlargement and heterogeneous low-attenuation within the left psoas muscle (series 3/image 50). IMPRESSION: 1. New heterogeneous hyperdense collection in the medial upper left thigh muscles, spanning up to 14.1 x 9.4 cm, compatible with intramuscular hematoma. 2. New mild asymmetric enlargement and heterogeneous low-attenuation within the left psoas muscle, suspect a subacute intramuscular hematoma. 3. No intraperitoneal collections. Ileocolic anastomosis appears intact in the right abdomen status post right hemicolectomy. No bowel obstruction. 4. Extensive subcutaneous emphysema throughout the bilateral  ventral abdominopelvic wall, right greater than left, decreased in the interval. No discrete drainable superficial fluid collection. 5. Worsened anasarca. Small dependent bilateral pleural effusions, left greater than right. 6. Mildly irregular liver surface, cannot exclude cirrhosis. No discrete liver masses. 7. Moderate sigmoid diverticulosis. 8. Aortic Atherosclerosis (ICD10-I70.0). Electronically Signed   By: JIlona SorrelM.D.   On: 07/29/2021 08:33   DG Chest Port 1 View  Result Date: 07/27/2021 CLINICAL DATA:  PICC line placement EXAM: PORTABLE CHEST 1 VIEW COMPARISON:  07/19/2021, 07/18/2021, FINDINGS: Interval right upper extremity central venous catheter with tip visible over SVC. Small left pleural effusion with slight increased left basilar consolidation. Cardiomegaly with mild central congestion. No pneumothorax. Previously noted left sided central venous catheter has been removed IMPRESSION: 1. Removal of left-sided central venous catheter. New right upper extremity central venous catheter with tip over the SVC. 2. Cardiomegaly with vascular congestion, small left effusion and left basilar consolidation. Electronically Signed   By: KDonavan FoilM.D.   On: 07/27/2021 18:20   [place]KG SITE RITE  Result Date: 07/27/2021 If Site Rite image not attached, placement could not be confirmed due to current cardiac rhythm.   Cardiac Studies   None this admission  Patient Profile     75y.o. female 75year old female with permanent atrial fibrillation, systolic heart  failure (EF 30%, diagnosed [facility] earlier this month), recently diagnosed metastatic colon cancer, anemia, lymphedema, morbid obesity who was admitted to the hospital on 07/18/2021 with hypotension and septic shock secondary to coloncutaneous fistula. Also found to be COVID positive. Cardiology was consulted on 07/27/2021 for atrial fibrillation with RVR and hypotension.   Assessment & Plan   Principal Problem:   Septic shock  ([place]) Active Problems:   Hypokalemia   Morbid obesity with BMI of 50.0-59.9, adult (HCC)   Colon carcinoma ([place])   Enterocutaneous fistula   Cellulitis   AKI (acute kidney injury) ([place])   Hyponatremia   Afib with RVR, likely permanent -Currently admitted to the hospital with septic shock secondary to colocutaneous fistula after recent right hemicolectomy for newly diagnosed metastatic colon cancer.  Course is also complicated by anemia. - Hb 6.8 today.  -CT abd/pelv impressions: 1. New heterogeneous hyperdense collection in the medial upper left thigh muscles, spanning up to 14.1 x 9.4 cm, compatible with intramuscular hematoma. 2. New mild asymmetric enlargement and heterogeneous low-attenuation within the left psoas muscle, suspect a subacute intramuscular hematoma. - antibiotics for fistula. -atrial fibrillation with RVR likely secondary to continued infection and persistent anemia with evidence of intramuscular bleeding.  -Given her low EF and hypotension would avoid beta-blockers and calcium channel blockers for now. - IV amiodarone seems to be helping rates, continue for now.  -She is currently on therapeutic Lovenox, may need to hold in setting of worsening anemia. Continue transfusions for Hb <7-8 in setting of HFrEF.   2. Systolic heart failure, EF 30% -Newly diagnosed at [facility] with an EF of 30%.  - Cardiomyopathy may be related to critical illness, afib is chronic.  She has had extended admission for septic shock and recurrent infection. - signs of anasarca and pleural effusions on imaging. If hypoxic consider prn lasix. Challenging in setting of anemia and sepsis. - colocutaneous fistula secondary to her recent right hemicolectomy. -Given metastatic cancer, current ill state, recurrent anemia she is not a candidate for invasive or aggressive cardiovascular care. -She is currently DNR/DNI.  Recommend continued palliative care involvement. Suspect hospice most  appropriate.  Continue goals of care discussions - concern for grave prognosis.      For questions or updates, please contact [place] Please consult [email or web address] for contact info under        Signed, Elouise Munroe, MD  07/29/2021, 10:06 AM

## 2021-07-29 NOTE — Progress Notes (Deleted)
Lab tech stated patient has exceeded the number of sticks within 24 hours per policy for lab draws and she can not draw patient's post-blood transfusion or AM labs. Pt has a PICC with TPN running that resulted in possible diluted labs that were drawn from that line yesterday morning. Hospitalist paged.

## 2021-07-29 NOTE — Progress Notes (Signed)
HOSPITAL MEDICINE OVERNIGHT EVENT NOTE    Notified by nursing that patient's hemoglobin this morning is 6.8 despite 2 units of packed red blood cell transfusion.  Still, nursing reports no obvious evidence of significant bleeding that would explain the precipitous drop in hemoglobin and hematocrit patient experienced yesterday.  Chart reviewed, patient still has yet to undergo CT imaging of the abdomen pelvis to identify for any potential source of bleeding as well.  Nursing states the patient is not short of breath or complaining of chest pain.  We will go ahead and order 1 additional unit of packed red blood cells to be transfused.  We will switch order for CT imaging of abdomen and pelvis to stat.  Vernelle Emerald  MD [facility]

## 2021-07-29 NOTE — Progress Notes (Signed)
Patient ID: Lauren [name], female   DOB: [birth year], 75 y.o.   MRN: [id number] I have reviewed the patient's CT scan showing the left medial thigh hematoma.  I would recommend continued observation for now I would not recommend any surgical intervention to evacuate any type of hematoma unless clinically urgently indicated which is usually not the case for hematoma such as this which are likely related to her anticoagulation medications.

## 2021-07-29 NOTE — Progress Notes (Signed)
Hospitalist notified first unit of prbc still infusing w/approx 80cc left at 4 hours after start of infusion due to patient repeatedly bending arm and occluding line. Awaiting response

## 2021-07-29 NOTE — Progress Notes (Signed)
Lab requesting another specimen post-transfusion for H/H d/t insufficient quantity from previous peripheral draw

## 2021-07-30 DIAGNOSIS — D62 Acute posthemorrhagic anemia: Secondary | ICD-10-CM | POA: Diagnosis not present

## 2021-07-30 DIAGNOSIS — A419 Sepsis, unspecified organism: Secondary | ICD-10-CM | POA: Diagnosis not present

## 2021-07-30 DIAGNOSIS — I4891 Unspecified atrial fibrillation: Secondary | ICD-10-CM | POA: Diagnosis not present

## 2021-07-30 DIAGNOSIS — S7012XA Contusion of left thigh, initial encounter: Secondary | ICD-10-CM | POA: Diagnosis not present

## 2021-07-30 DIAGNOSIS — R6521 Severe sepsis with septic shock: Secondary | ICD-10-CM | POA: Diagnosis not present

## 2021-07-30 DIAGNOSIS — I5022 Chronic systolic (congestive) heart failure: Secondary | ICD-10-CM | POA: Diagnosis not present

## 2021-07-30 LAB — TYPE AND SCREEN
ABO/RH(D): O POS
Antibody Screen: NEGATIVE
Unit division: 0
Unit division: 0
Unit division: 0
Unit division: 0

## 2021-07-30 LAB — BPAM RBC
Blood Product Expiration Date: 202209102359
Blood Product Expiration Date: 202210012359
Blood Product Expiration Date: 202210022359
Blood Product Expiration Date: 202210022359
ISSUE DATE / TIME: 202209032058
ISSUE DATE / TIME: 202209040147
ISSUE DATE / TIME: 202209040628
ISSUE DATE / TIME: 202209041541
Unit Type and Rh: 5100
Unit Type and Rh: 5100
Unit Type and Rh: 5100
Unit Type and Rh: 5100

## 2021-07-30 LAB — CBC
HCT: 23.7 % — ABNORMAL LOW (ref 36.0–46.0)
HCT: 26.3 % — ABNORMAL LOW (ref 36.0–46.0)
Hemoglobin: 7.4 g/dL — ABNORMAL LOW (ref 12.0–15.0)
Hemoglobin: 8.4 g/dL — ABNORMAL LOW (ref 12.0–15.0)
MCH: 26.8 pg (ref 26.0–34.0)
MCH: 27.1 pg (ref 26.0–34.0)
MCHC: 31.2 g/dL (ref 30.0–36.0)
MCHC: 31.9 g/dL (ref 30.0–36.0)
MCV: 85.9 fL (ref 80.0–100.0)
MCV: 84.8 fL (ref 80.0–100.0)
Platelets: 138 10*3/uL — ABNORMAL LOW (ref 150–400)
Platelets: 148 10*3/uL — ABNORMAL LOW (ref 150–400)
RBC: 2.76 MIL/uL — ABNORMAL LOW (ref 3.87–5.11)
RBC: 3.1 MIL/uL — ABNORMAL LOW (ref 3.87–5.11)
RDW: 24.7 % — ABNORMAL HIGH (ref 11.5–15.5)
RDW: 25.2 % — ABNORMAL HIGH (ref 11.5–15.5)
WBC: 14.2 10*3/uL — ABNORMAL HIGH (ref 4.0–10.5)
WBC: 14.5 10*3/uL — ABNORMAL HIGH (ref 4.0–10.5)
nRBC: 0.2 % (ref 0.0–0.2)
nRBC: 0.1 % (ref 0.0–0.2)

## 2021-07-30 LAB — COMPREHENSIVE METABOLIC PANEL
ALT: 15 U/L (ref 0–44)
AST: 23 U/L (ref 15–41)
Albumin: 1.1 g/dL — ABNORMAL LOW (ref 3.5–5.0)
Alkaline Phosphatase: 92 U/L (ref 38–126)
Anion gap: 6 (ref 5–15)
BUN: 20 mg/dL (ref 8–23)
CO2: 25 mmol/L (ref 22–32)
Calcium: 7.9 mg/dL — ABNORMAL LOW (ref 8.9–10.3)
Chloride: 110 mmol/L (ref 98–111)
Creatinine, Ser: 0.53 mg/dL (ref 0.44–1.00)
GFR, Estimated: >60 mL/min (ref >60)
Glucose, Bld: 105 mg/dL — ABNORMAL HIGH (ref 70–99)
Potassium: 3.7 mmol/L (ref 3.5–5.1)
Sodium: 141 mmol/L (ref 135–145)
Total Bilirubin: 0.9 mg/dL (ref 0.3–1.2)
Total Protein: 4.8 g/dL — ABNORMAL LOW (ref 6.5–8.1)

## 2021-07-30 LAB — TRIGLYCERIDES: Triglycerides: 69 mg/dL (ref <150)

## 2021-07-30 LAB — MAGNESIUM: Magnesium: 2.1 mg/dL (ref 1.7–2.4)

## 2021-07-30 LAB — PHOSPHORUS: Phosphorus: 3.2 mg/dL (ref 2.5–4.6)

## 2021-07-30 MED ORDER — POTASSIUM CHLORIDE 10 MEQ/50ML IV SOLN
10.0000 meq | INTRAVENOUS | Status: AC
  Administered 2021-07-30 (×3): 10 meq via INTRAVENOUS
  Filled 2021-07-30 (×3): qty 50

## 2021-07-30 MED ORDER — ALTEPLASE 2 MG IJ SOLR
2.0000 mg | Freq: Once | INTRAMUSCULAR | Status: AC
  Administered 2021-07-30: 2 mg
  Filled 2021-07-30: qty 2

## 2021-07-30 MED ORDER — TRACE MINERALS CU-MN-SE-ZN 300-55-60-3000 MCG/ML IV SOLN
INTRAVENOUS | Status: AC
  Filled 2021-07-30: qty 1512

## 2021-07-30 NOTE — Consult Note (Signed)
[place]  [phone] Fax:([phone] [place]   Referral MD  Reason for Referral: Adenocarcinoma of the colon.  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  Medical oncology was consulted regarding general prognosis and discussion about adenocarcinoma.  She appears to have multiple medical issues ongoing at this time which are unrelated to her carcinoma.  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  From the adenocarcinoma standpoint, there is no evidence of distant metastasis at least on my review.  I would however recommend a CT chest to rule out any presence of pulmonary metastasis.  She had locally advanced cecal adenocarcinoma which has been successfully removed with negative margins.  Typically in patients who are healthy, we would have recommended adjuvant chemotherapy this is only to reduce the risk of recurrence.  Even if this patient were never to receive adjuvant chemotherapy because of the ongoing complications, it does not necessarily mean that her cancer will return although the risk will be higher without adjuvant chemotherapy.  So at least from adenocarcinoma standpoint, her prognosis is decent as long as the CT chest is clear. I understand that she has several other ongoing medical issues which can severely limit her prognosis.  We do not have to follow her inpatient at this time.  If she were to recover from all her acute complications, please do refer her to outpatient medical oncology, she prefers to be followed in [place]. Acute blood loss anemia  hemoglobin today appears above 7 g/dl We will see her as needed.  Thank you for consulting [place] the care of this patient.  Please not hesitate to contact us with any additional questions or concerns.  HPI:   75 year old with past medical history significant for A. fib, morbid obesity, hypertension, congestive heart failure who presented with constipation and rectal pain for several days on July 17, 2021.  She was also seen for similar complaints at [facility] few days prior to this admission.  She was found to be severely anemic.  She had a CT abdomen pelvis which showed cecal thickening raising questions of a possible cecal mass with small adjacent lymph nodes.  She was recommended colonoscopy but she left AGAINST MEDICAL ADVICE. She then returned, had a colonoscopy on 07/05/2021 which showed partially obstructing cecal mass. She then had right hemicolectomy on 07/06/2021 by Dr. Arnoldo Morale for ascending colon carcinoma.  She had side-to-side ileocolic anastomosis.  Pathology from this surgery showed 8.9 cm moderately differentiated adenocarcinoma, metastatic carcinoma involving 1 out of 21 lymph nodes, omental nodule with fat necrosis and calcifications.  Margins uninvolved by carcinoma.  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  There was however no evidence of distant metastasis noted.  MSS CT abdomen pelvis done prior to surgery showed no evidence of distant metastasis.  Most recent CT abdomen pelvis showed new heterogeneous collection in the medial upper left thigh muscles compatible with intramuscular hematoma, subacute intramuscular psoas hematoma extensive subcutaneous emphysema throughout the bilateral abdominal pelvic wall and again no clear evidence of distant metastasis.  There were  several imaging of the chest but I  did not see a CT chest done.  Her CEA was normal prior to surgery.  She is now admitted with GI bleeding and is found to have a  fistula extending from bowel adjacent to suture through an umbilical hernia into the abdominal wall.  She was started on antibiotics, vasopressors and is now on TPN. Medical oncology is consulted regarding discussion of prognosis especially given her recent diagnosis of adenocarcinoma of the colon.  She is a reasonable historian, she appears to be most worried about her healing currently.  She would like to eat whenever she is allowed to.  She does appear to have some understanding of her adenocarcinoma.  She is also dealing with some anemia likely dilutional, hematoma is receiving blood transfusion.  She mentions a family history of colon cancer in her dad, her dad sister also had some female cancer.   She denies any pain for me today.  Abdominal pain seems to be at baseline.  Rest of the pertinent review of systems reviewed and negative.  Past Medical History:  Diagnosis Date   Acute cholecystitis 12/06/2012   Chronic combined systolic and diastolic CHF (congestive heart failure) (HCC)    Colon cancer (HCC)    Diverticulosis    Hypotension    BP runs soft   Hypothyroidism    Lymphedema    Morbid obesity ([place])    Obesity    Pancreatitis, acute 0000000   Periumbilical hernia    Permanent atrial fibrillation ([place])   :   Past Surgical History:  Procedure Laterality Date   BIOPSY  07/05/2021   Procedure: BIOPSY;  Surgeon: [name], DO;  [facility];  Service: Endoscopy;;   CHOLECYSTECTOMY N/A 03/25/2014   Procedure: LAPAROSCOPIC CHOLECYSTECTOMY WITH INTRAOPERATIVE CHOLANGIOGRAM;  Surgeon: Ralene Ok, MD;  Location: [place];  Service: General;  Laterality: N/A;   COLONOSCOPY WITH PROPOFOL N/A 07/05/2021   Procedure: COLONOSCOPY WITH PROPOFOL;  Surgeon: [name], DO;  [facility];  Service: Endoscopy;  Laterality: N/A;   ESOPHAGOGASTRODUODENOSCOPY (EGD) WITH PROPOFOL N/A 07/05/2021   Procedure: ESOPHAGOGASTRODUODENOSCOPY (EGD) WITH PROPOFOL;   Surgeon: [name], DO;  [facility];  Service: Endoscopy;  Laterality: N/A;   PARTIAL COLECTOMY N/A 07/06/2021   Procedure: RIGHT HEMICOLECTOMY;  Surgeon: [name] Signs, MD;  Location: [facility];  Service: General;  Laterality: N/A;   POLYPECTOMY  07/05/2021   Procedure: POLYPECTOMY;  Surgeon: [name], DO;  [facility];  Service: Endoscopy;;  :   Current Facility-Administered Medications  Medication Dose Route Frequency Provider Last Rate Last Admin   amiodarone (NEXTERONE PREMIX) 360-4.14 MG/200ML-% (1.8 mg/mL) IV infusion  30 mg/hr Intravenous Continuous Cherlynn Kaiser A, MD 16.67 mL/hr at 07/30/21 0603 30 mg/hr at 07/30/21 0603   Chlorhexidine Gluconate Cloth 2 % PADS 6 each  6 each Topical Daily Chesley Mires, MD   6 each at 07/30/21 0845   diphenhydrAMINE (BENADRYL) injection 12.5 mg  12.5 mg Intravenous Q6H PRN Pokhrel, Laxman, MD       haloperidol lactate (HALDOL) injection 0.5 mg  0.5 mg Intravenous Q6H PRN Bonnielee Haff, MD   0.5 mg at 07/30/21 1111   HYDROmorphone (DILAUDID) injection 0.5-1 mg  0.5-1 mg Intravenous Q4H PRN Bonnielee Haff, MD   1 mg at 07/30/21 0554   levothyroxine (SYNTHROID, LEVOTHROID) injection 50 mcg  50 mcg Intravenous Daily Estill Cotta, NP   50 mcg at 07/30/21 0855   MEDLINE mouth rinse  15 mL  Mouth Rinse BID Pokhrel, Laxman, MD   15 mL at 07/30/21 0846   methocarbamol (ROBAXIN) 500 mg in dextrose 5 % 50 mL IVPB  500 mg Intravenous Q8H PRN Bonnielee Haff, MD 100 mL/hr at 07/25/21 2343 500 mg at 07/25/21 2343   ondansetron (ZOFRAN) injection 4 mg  4 mg Intravenous Q6H PRN Estill Cotta, NP   4 mg at 07/24/21 2323   pantoprazole (PROTONIX) injection 40 mg  40 mg Intravenous Q12H Bonnielee Haff, MD   40 mg at 07/30/21 0855   piperacillin-tazobactam (ZOSYN) IVPB 3.375 g  3.375 g Intravenous Q8H Ursula Beath, RPH 12.5 mL/hr at 07/30/21 0603 3.375 g at 07/30/21 0603   sodium chloride flush (NS) 0.9 % injection  10-40 mL  10-40 mL Intracatheter Q12H Mannam, Praveen, MD   10 mL at 07/30/21 0846   sodium chloride flush (NS) 0.9 % injection 10-40 mL  10-40 mL Intracatheter PRN Mannam, Praveen, MD   10 mL at 07/22/21 2104   sodium chloride flush (NS) 0.9 % injection 10-40 mL  10-40 mL Intracatheter PRN Chesley Mires, MD       TPN ADULT (ION)   Intravenous Continuous TPN Alvira Philips, [place] 105 mL/hr at 07/30/21 0603 Infusion Verify at 07/30/21 0603   TPN ADULT (ION)   Intravenous Continuous TPN Mancheril, Darnell Level, RPH          Allergies  Allergen Reactions   Codeine Itching   Compazine [Prochlorperazine Edisylate]    Prednisone Nausea And Vomiting   Tetanus Toxoids Other (See Comments)    Knot like "hen egg" came up    Vicodin [Hydrocodone-Acetaminophen] Itching  :   Family History  Problem Relation Age of Onset   Cancer Father        liver   Hypertension Brother    Heart disease Maternal Grandmother    Kidney disease Maternal Grandfather    Diabetes Maternal Grandfather    Kidney failure Brother   :   Social History   Socioeconomic History   Marital status: Married    Spouse name: Not on file   Number of children: Not on file   Years of education: Not on file   Highest education level: Not on file  Occupational History   Not on file  Tobacco Use   Smoking status: Never   Smokeless tobacco: Never  Vaping Use   Vaping Use: Never used  Substance and Sexual Activity   Alcohol use: No   Drug use: No   Sexual activity: Not Currently  Other Topics Concern   Not on file  Social History Narrative   Not on file   Social Determinants of Health   Financial Resource Strain: Not on file  Food Insecurity: Not on file  Transportation Needs: Not on file  Physical Activity: Not on file  Stress: Not on file  Social Connections: Not on file  Intimate Partner Violence: Not on file  :    Exam: Patient Vitals for the past 24 hrs:  BP Temp Temp src Pulse Resp SpO2  07/30/21  1136 132/84 97.6 F (36.4 C) Oral 69 19 97 %  07/30/21 0748 118/69 97.6 F (36.4 C) Axillary 87 20 97 %  07/30/21 0300 (!) 117/32 97.8 F (36.6 C) Axillary 86 20 96 %  07/30/21 0000 123/61 (!) 97.4 F (36.3 C) Axillary (!) 120 20 95 %  07/29/21 2000 (!) 126/39 (!) 97.4 F (36.3 C) Axillary 86 15 95 %  07/29/21 1830 105/67 98.2  F (36.8 C) Oral 72 (!) 22 --  07/29/21 1631 (!) 126/55 98.4 F (36.9 C) -- 93 (!) 22 --  07/29/21 1600 135/74 98.2 F (36.8 C) Oral 98 20 95 %    Physical Exam Constitutional:      Appearance: She is ill-appearing.     Comments: Pallor noted  HENT:     Head: Normocephalic and atraumatic.  Cardiovascular:     Rate and Rhythm: Normal rate and regular rhythm.  Pulmonary:     Effort: Pulmonary effort is normal.     Breath sounds: Normal breath sounds.     Comments: Anterior lung fields appear clear Abdominal:     Comments: Stool draining into [place] pouch  Musculoskeletal:     Cervical back: No rigidity.  Lymphadenopathy:     Cervical: No cervical adenopathy.  Neurological:     General: No focal deficit present.     Mental Status: She is alert.  Psychiatric:        Mood and Affect: Mood normal.  PPP    Lab Results  Component Value Date   WBC 14.2 (H) 07/30/2021   HGB 7.4 (L) 07/30/2021   HCT 23.7 (L) 07/30/2021   PLT 138 (L) 07/30/2021   GLUCOSE 105 (H) 07/30/2021   CHOL 190 12/05/2012   TRIG 69 07/30/2021   HDL 70 12/05/2012   LDLCALC 89 12/05/2012   ALT 15 07/30/2021   AST 23 07/30/2021   NA 141 07/30/2021   K 3.7 07/30/2021   CL 110 07/30/2021   CREATININE 0.53 07/30/2021   BUN 20 07/30/2021   CO2 25 07/30/2021    DG Abd 1 View  Result Date: 07/12/2021 CLINICAL DATA:  Constant EXAM: ABDOMEN - 1 VIEW COMPARISON:  Portable exam 1355 hours compared to 07/03/2021 FINDINGS: Gaseous distension of multiple small bowel loops throughout abdomen. Paucity of colonic gas though small amount gas is seen within the distal sigmoid colon and  rectum. Surgical clips identified at the mid abdomen. Findings likely represent postoperative ileus. No bowel wall thickening or urinary tract calcification. Degenerative changes and levoconvex scoliosis lumbar spine. IMPRESSION: Gaseous distention of multiple small bowel loops throughout abdomen favoring postoperative ileus. Electronically Signed   By: Lavonia Dana M.D.   On: 07/12/2021 14:28   DG Abdomen 1 View  Result Date: 07/03/2021 CLINICAL DATA:  Constipation EXAM: ABDOMEN - 1 VIEW COMPARISON:  CT pelvis June 28, 2021 FINDINGS: The bowel gas pattern is normal. Moderate volume of formed stool throughout the colon. Degenerative changes bilateral hips. Lumbar spondylosis. IMPRESSION: Nonobstructive bowel gas pattern. Moderate volume of formed stool throughout the colon. Electronically Signed   By: Dahlia Bailiff MD   On: 07/03/2021 17:45   CT ABDOMEN PELVIS W CONTRAST  Result Date: 07/29/2021 CLINICAL DATA:  Inpatient. Abdominal pain. Concern for abscess. Hemoglobin drop. Right hemicolectomy 07/06/2021 for colon cancer. EXAM: CT ABDOMEN AND PELVIS WITH CONTRAST TECHNIQUE: Multidetector CT imaging of the abdomen and pelvis was performed using the standard protocol following bolus administration of intravenous contrast. CONTRAST:  18m OMNIPAQUE IOHEXOL 350 MG/ML SOLN COMPARISON:  07/18/2021 CT abdomen/pelvis. FINDINGS: Lower chest: Mild-to-moderate bibasilar atelectasis with small dependent bilateral pleural effusions, left greater than right. Hepatobiliary: Mildly heterogeneous liver parenchymal enhancement without discrete liver masses. Slightly irregular liver surface, cannot exclude cirrhosis. Cholecystectomy. Bile ducts are stable and within normal post cholecystectomy limits with CBD diameter 7 mm. Pancreas: Normal, with no mass or duct dilation. Spleen: Normal size. No mass. Adrenals/Urinary Tract: Normal adrenals. No hydronephrosis. Subcentimeter  hypodense interpolar left renal cortical lesion is  too small to characterize and unchanged, requiring no follow-up. No suspicious renal masses. Normal bladder. Stomach/Bowel: Normal non-distended stomach. Status post right hemicolectomy with intact appearing ileocolic anastomosis in the right abdomen. No dilated or thick-walled small bowel loops. Moderate sigmoid diverticulosis. No large bowel wall thickening or significant pericolonic fat stranding in the remnant large-bowel. Vascular/Lymphatic: Atherosclerotic nonaneurysmal abdominal aorta. Patent portal, splenic, hepatic and renal veins. No pathologically enlarged lymph nodes in the abdomen or pelvis. Reproductive: No adnexal masses.  Stable mildly enlarged uterus. Other: No pneumoperitoneum. Extensive subcutaneous emphysema throughout the bilateral ventral abdominopelvic wall, right greater than left, decreased in the interval. Worsened anasarca. No discrete drainable superficial fluid collection. No intraperitoneal fluid collections. Musculoskeletal: No aggressive appearing focal osseous lesions. Marked lumbar spondylosis. New heterogeneous hyperdense collection in the medial upper left thigh muscles (series 3/image 103) spanning up to 14.1 x 9.4 cm. New mild asymmetric enlargement and heterogeneous low-attenuation within the left psoas muscle (series 3/image 50). IMPRESSION: 1. New heterogeneous hyperdense collection in the medial upper left thigh muscles, spanning up to 14.1 x 9.4 cm, compatible with intramuscular hematoma. 2. New mild asymmetric enlargement and heterogeneous low-attenuation within the left psoas muscle, suspect a subacute intramuscular hematoma. 3. No intraperitoneal collections. Ileocolic anastomosis appears intact in the right abdomen status post right hemicolectomy. No bowel obstruction. 4. Extensive subcutaneous emphysema throughout the bilateral ventral abdominopelvic wall, right greater than left, decreased in the interval. No discrete drainable superficial fluid collection. 5.  Worsened anasarca. Small dependent bilateral pleural effusions, left greater than right. 6. Mildly irregular liver surface, cannot exclude cirrhosis. No discrete liver masses. 7. Moderate sigmoid diverticulosis. 8. Aortic Atherosclerosis (ICD10-I70.0). Electronically Signed   By: Ilona Sorrel M.D.   On: 07/29/2021 08:33   CT ABDOMEN PELVIS W CONTRAST  Result Date: 07/18/2021 CLINICAL DATA:  Abscess suspected EXAM: CT ABDOMEN AND PELVIS WITH CONTRAST TECHNIQUE: Multidetector CT imaging of the abdomen and pelvis was performed using the standard protocol following bolus administration of intravenous contrast. CONTRAST:  28m OMNIPAQUE IOHEXOL 350 MG/ML SOLN COMPARISON:  CT abdomen/pelvis 12/04/2012 FINDINGS: Lower chest: There are small bilateral pleural effusions with adjacent atelectasis. The imaged heart is unremarkable. Hepatobiliary: The liver is unremarkable. The gallbladder is surgically absent. Mild prominence of the common bile duct and intrahepatic ducts is likely due to the history of cholecystectomy. Pancreas: The pancreas is mildly atrophic. There are no focal lesions or contour abnormalities. There is no peripancreatic inflammatory change. Spleen: Unremarkable. Adrenals/Urinary Tract: The adrenals are unremarkable. There are no focal renal lesions or stones. There is no hydronephrosis or hydroureter. The bladder is unremarkable. There is no excretion of contrast on the delayed images into the collecting system. Stomach/Bowel: The stomach is grossly unremarkable. The patient is status post right hemicolectomy with a sutures identified in the mid abdomen. 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 This communicates with a 7.4 cm by 5.7 cm by 6.9 cm collection in the anterior abdominal wall. There is mild thickening of multiple loops of small bowel in the lower mid  abdomen. There are scattered colonic diverticula without evidence of acute diverticulitis. Vascular/Lymphatic: There is scattered calcified atherosclerotic plaque throughout the nonaneurysmal abdominal aorta. The main portal and splenic veins are patent. There is no abdominal or pelvic lymphadenopathy. Reproductive: The uterus and adnexa are unremarkable. Other: There is small volume  ascites scattered throughout the abdomen. Haziness in the mesenteric fat likely reflects inflammatory change. There is no free intraperitoneal air. There is extensive gas tracking throughout the soft tissues of the abdomen into the right back and axilla. No other organized or drainable fluid collections are identified. Musculoskeletal: There is no acute osseous abnormality. IMPRESSION: 1. Status post right hemicolectomy with a suture site in the mid abdomen. 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 The fistula is in communication with a focal collection with layering debris measuring up to 7.4 cm in the abdominal wall consistent with abscess. 2. Extensive soft tissue emphysema tracking throughout the body wall may be coming from the bowel, though necrotizing fasciitis would have a similar appearance. This was discussed with Dr Roderic Palau as below. 3. Mild inflammatory change of loops of small bowel and scattered ascites throughout the abdomen, likely reactive. No free intraperitoneal air is identified. 4. Absence of contrast in the renal collecting systems on the delayed phase images; correlate with renal function. 5. Small bilateral pleural effusions. These results were called by telephone at the time of interpretation on 07/18/2021 at 10:34 am to provider [facility] ZAMMIT , who verbally acknowledged these results. Electronically Signed   By: Valetta Mole M.D.   On: 07/18/2021 10:38   DG Chest Port 1  View  Result Date: 07/27/2021 CLINICAL DATA:  PICC line placement EXAM: PORTABLE CHEST 1 VIEW COMPARISON:  07/19/2021, 07/18/2021, FINDINGS: Interval right upper extremity central venous catheter with tip visible over SVC. Small left pleural effusion with slight increased left basilar consolidation. Cardiomegaly with mild central congestion. No pneumothorax. Previously noted left sided central venous catheter has been removed IMPRESSION: 1. Removal of left-sided central venous catheter. New right upper extremity central venous catheter with tip over the SVC. 2. Cardiomegaly with vascular congestion, small left effusion and left basilar consolidation. Electronically Signed   By: Donavan Foil M.D.   On: 07/27/2021 18:20   DG CHEST PORT 1 VIEW  Result Date: 07/19/2021 CLINICAL DATA:  PICC placement. EXAM: PORTABLE CHEST 1 VIEW COMPARISON:  Radiograph earlier today. FINDINGS: Patient is rotated. The previous right upper extremity PICC tip is in the region of the distal brachiocephalic vein/SVC confluence. There is a new left upper extremity PICC with tip overlying the region of the mid SVC. No pneumothorax unchanged cardiomegaly. Irregular left basilar opacities are similar. There is increasing streaky right basilar atelectasis. No visualized pneumothorax. Soft tissue emphysema noted in the right axilla, also seen on abdominopelvic CT 07/18/2021. IMPRESSION: 1. Tip of the new left upper extremity PICC in the mid SVC. Previous right upper extremity PICC tip in the region of the brachiocephalic SVC confluence. 2. Unchanged cardiomegaly and irregular left basilar opacities. Increasing streaky right basilar atelectasis. 3. Soft tissue emphysema in the right axilla, also seen on abdominopelvic CT 07/18/2021. Electronically Signed   By: Keith Rake M.D.   On: 07/19/2021 18:37   DG Chest Port 1 View  Result Date: 07/19/2021 CLINICAL DATA:  COVID-19 Abdominal pain EXAM: PORTABLE ABDOMEN - 1 VIEW; PORTABLE CHEST -  1 VIEW COMPARISON:  Chest radiograph 07/18/2021 FINDINGS: Chest: Right upper extremity PICC terminates in the region of the upper superior vena cava. Heart is mildly enlarged. No significant pulmonary vascular congestion. Mild opacities at the left lung base favored to be atelectasis/scarring. Lungs are otherwise clear. Abdomen: Subcutaneous emphysema again seen throughout the abdominal and  pelvic walls, similar to CT of the abdomen and pelvis from 07/18/2021. No dilated loops of bowel to indicate ileus or obstruction. There is levoconvex curvature of the lumbar spine. IMPRESSION: 1. Mild cardiomegaly. 2. Left basilar opacity favored to be atelectasis. 3. Redemonstration of diffuse abdominal wall emphysema. 4. Nonobstructive bowel gas pattern. Electronically Signed   By: Miachel Roux M.D.   On: 07/19/2021 07:46   DG CHEST PORT 1 VIEW  Result Date: 07/18/2021 CLINICAL DATA:  PICC line placement EXAM: PORTABLE CHEST 1 VIEW COMPARISON:  07/18/2021 FINDINGS: Right upper extremity PICC line is been placed with its tip overlying the expected superior vena cava. Lung volumes are small. Small left pleural effusion is stable. No pneumothorax. Cardiac size is mildly enlarged, unchanged. Pulmonary vascularity is normal. IMPRESSION: Right upper extremity PICC line tip within the superior vena cava. Small left pleural effusion, unchanged. Electronically Signed   By: Fidela Salisbury M.D.   On: 07/18/2021 22:34   DG Chest Portable 1 View  Result Date: 07/18/2021 CLINICAL DATA:  PICC line placement. EXAM: PORTABLE CHEST 1 VIEW COMPARISON:  July 18, 2021. FINDINGS: Stable cardiomediastinal silhouette. Interval placement of right-sided PICC line with distal tip in expected position of the SVC. Mild bibasilar subsegmental atelectasis is noted with small pleural effusions. Bony thorax is unremarkable. IMPRESSION: Interval placement of right-sided PICC line with distal tip in expected position of the SVC. Mild bibasilar  subsegmental atelectasis with small pleural effusions. Electronically Signed   By: Marijo Conception M.D.   On: 07/18/2021 15:01   DG Chest Port 1 View  Result Date: 07/18/2021 CLINICAL DATA:  Question sepsis EXAM: PORTABLE CHEST 1 VIEW COMPARISON:  Multiple chest radiographs, most recently 07/12/2021. KUB, 07/12/2021. FINDINGS: Support lines: Interval removal of RIGHT upper extremity PICC. Overlying support needs. Cardiac silhouette enlargement, unchanged. Low lung volumes. The RIGHT lung is clear. LEFT basilar consolidation obscuring the cardiac apex, unchanged. No pleural effusion or pneumothorax. Appearance of subcutaneous gas tracking along the RIGHT chest wall, axilla and imaged RIGHT upper quadrant. No interval osseous abnormality. IMPRESSION: 1. Interval removal RIGHT upper extremity PICC 2. LEFT basilar atelectasis, unchanged. 3. Overlying density with appearance of tracking subcutaneous gas along the RIGHT axilla and upper quadrant. Clinical correlation is recommended. Electronically Signed   By: Michaelle Birks M.D.   On: 07/18/2021 08:00   DG CHEST PORT 1 VIEW  Result Date: 07/12/2021 CLINICAL DATA:  PICC line verification EXAM: PORTABLE CHEST 1 VIEW COMPARISON:  Portable exam 1352 hours compared to 06/28/2021 FINDINGS: RIGHT arm PICC line tip projects over SVC. Minimal enlargement of cardiac silhouette. Mediastinal contours and pulmonary vascularity normal. Subsegmental atelectasis LEFT base. Lungs otherwise clear. Tiny LEFT pleural effusion. No pneumothorax. IMPRESSION: LEFT basilar atelectasis and tiny pleural effusion. RIGHT arm PICC line tip projects over SVC. Electronically Signed   By: Lavonia Dana M.D.   On: 07/12/2021 14:27   DG Abd Portable 1V  Result Date: 07/19/2021 CLINICAL DATA:  COVID-19 Abdominal pain EXAM: PORTABLE ABDOMEN - 1 VIEW; PORTABLE CHEST - 1 VIEW COMPARISON:  Chest radiograph 07/18/2021 FINDINGS: Chest: Right upper extremity PICC terminates in the region of the upper  superior vena cava. Heart is mildly enlarged. No significant pulmonary vascular congestion. Mild opacities at the left lung base favored to be atelectasis/scarring. Lungs are otherwise clear. Abdomen: Subcutaneous emphysema again seen throughout the abdominal and pelvic walls, similar to CT of the abdomen and pelvis from 07/18/2021. No dilated loops of bowel to indicate ileus or obstruction. There is  levoconvex curvature of the lumbar spine. IMPRESSION: 1. Mild cardiomegaly. 2. Left basilar opacity favored to be atelectasis. 3. Redemonstration of diffuse abdominal wall emphysema. 4. Nonobstructive bowel gas pattern. Electronically Signed   By: Miachel Roux M.D.   On: 07/19/2021 07:46   [place] EKG SITE RITE  Result Date: 07/27/2021 If Site Rite image not attached, placement could not be confirmed due to current cardiac rhythm.  [place] EKG SITE RITE  Result Date: 07/18/2021 If [facility] image not attached, placement could not be confirmed due to current cardiac rhythm.  [place] EKG SITE RITE  Result Date: 07/18/2021 If [facility] image not attached, placement could not be confirmed due to current cardiac rhythm.  [place] EKG SITE RITE  Result Date: 07/12/2021 If Site Rite image not attached, placement could not be confirmed due to current cardiac rhythm.   Pathology:Reviewed.  DG Abd 1 View  Result Date: 07/12/2021 CLINICAL DATA:  Constant EXAM: ABDOMEN - 1 VIEW COMPARISON:  Portable exam 1355 hours compared to 07/03/2021 FINDINGS: Gaseous distension of multiple small bowel loops throughout abdomen. Paucity of colonic gas though small amount gas is seen within the distal sigmoid colon and rectum. Surgical clips identified at the mid abdomen. Findings likely represent postoperative ileus. No bowel wall thickening or urinary tract calcification. Degenerative changes and levoconvex scoliosis lumbar spine. IMPRESSION: Gaseous distention of multiple small bowel loops throughout abdomen favoring postoperative ileus.  Electronically Signed   By: Lavonia Dana M.D.   On: 07/12/2021 14:28   DG Abdomen 1 View  Result Date: 07/03/2021 CLINICAL DATA:  Constipation EXAM: ABDOMEN - 1 VIEW COMPARISON:  CT pelvis June 28, 2021 FINDINGS: The bowel gas pattern is normal. Moderate volume of formed stool throughout the colon. Degenerative changes bilateral hips. Lumbar spondylosis. IMPRESSION: Nonobstructive bowel gas pattern. Moderate volume of formed stool throughout the colon. Electronically Signed   By: Dahlia Bailiff MD   On: 07/03/2021 17:45   CT ABDOMEN PELVIS W CONTRAST  Result Date: 07/29/2021 CLINICAL DATA:  Inpatient. Abdominal pain. Concern for abscess. Hemoglobin drop. Right hemicolectomy 07/06/2021 for colon cancer. EXAM: CT ABDOMEN AND PELVIS WITH CONTRAST TECHNIQUE: Multidetector CT imaging of the abdomen and pelvis was performed using the standard protocol following bolus administration of intravenous contrast. CONTRAST:  167m OMNIPAQUE IOHEXOL 350 MG/ML SOLN COMPARISON:  07/18/2021 CT abdomen/pelvis. FINDINGS: Lower chest: Mild-to-moderate bibasilar atelectasis with small dependent bilateral pleural effusions, left greater than right. Hepatobiliary: Mildly heterogeneous liver parenchymal enhancement without discrete liver masses. Slightly irregular liver surface, cannot exclude cirrhosis. Cholecystectomy. Bile ducts are stable and within normal post cholecystectomy limits with CBD diameter 7 mm. Pancreas: Normal, with no mass or duct dilation. Spleen: Normal size. No mass. Adrenals/Urinary Tract: Normal adrenals. No hydronephrosis. Subcentimeter hypodense interpolar left renal cortical lesion is too small to characterize and unchanged, requiring no follow-up. No suspicious renal masses. Normal bladder. Stomach/Bowel: Normal non-distended stomach. Status post right hemicolectomy with intact appearing ileocolic anastomosis in the right abdomen. No dilated or thick-walled small bowel loops. Moderate sigmoid  diverticulosis. No large bowel wall thickening or significant pericolonic fat stranding in the remnant large-bowel. Vascular/Lymphatic: Atherosclerotic nonaneurysmal abdominal aorta. Patent portal, splenic, hepatic and renal veins. No pathologically enlarged lymph nodes in the abdomen or pelvis. Reproductive: No adnexal masses.  Stable mildly enlarged uterus. Other: No pneumoperitoneum. Extensive subcutaneous emphysema throughout the bilateral ventral abdominopelvic wall, right greater than left, decreased in the interval. Worsened anasarca. No discrete drainable superficial fluid collection. No intraperitoneal fluid collections. Musculoskeletal: No aggressive appearing  focal osseous lesions. Marked lumbar spondylosis. New heterogeneous hyperdense collection in the medial upper left thigh muscles (series 3/image 103) spanning up to 14.1 x 9.4 cm. New mild asymmetric enlargement and heterogeneous low-attenuation within the left psoas muscle (series 3/image 50). IMPRESSION: 1. New heterogeneous hyperdense collection in the medial upper left thigh muscles, spanning up to 14.1 x 9.4 cm, compatible with intramuscular hematoma. 2. New mild asymmetric enlargement and heterogeneous low-attenuation within the left psoas muscle, suspect a subacute intramuscular hematoma. 3. No intraperitoneal collections. Ileocolic anastomosis appears intact in the right abdomen status post right hemicolectomy. No bowel obstruction. 4. Extensive subcutaneous emphysema throughout the bilateral ventral abdominopelvic wall, right greater than left, decreased in the interval. No discrete drainable superficial fluid collection. 5. Worsened anasarca. Small dependent bilateral pleural effusions, left greater than right. 6. Mildly irregular liver surface, cannot exclude cirrhosis. No discrete liver masses. 7. Moderate sigmoid diverticulosis. 8. Aortic Atherosclerosis (ICD10-I70.0). Electronically Signed   By: Ilona Sorrel M.D.   On: 07/29/2021 08:33    CT ABDOMEN PELVIS W CONTRAST  Result Date: 07/18/2021 CLINICAL DATA:  Abscess suspected EXAM: CT ABDOMEN AND PELVIS WITH CONTRAST TECHNIQUE: Multidetector CT imaging of the abdomen and pelvis was performed using the standard protocol following bolus administration of intravenous contrast. CONTRAST:  64m OMNIPAQUE IOHEXOL 350 MG/ML SOLN COMPARISON:  CT abdomen/pelvis 12/04/2012 FINDINGS: Lower chest: There are small bilateral pleural effusions with adjacent atelectasis. The imaged heart is unremarkable. Hepatobiliary: The liver is unremarkable. The gallbladder is surgically absent. Mild prominence of the common bile duct and intrahepatic ducts is likely due to the history of cholecystectomy. Pancreas: The pancreas is mildly atrophic. There are no focal lesions or contour abnormalities. There is no peripancreatic inflammatory change. Spleen: Unremarkable. Adrenals/Urinary Tract: The adrenals are unremarkable. There are no focal renal lesions or stones. There is no hydronephrosis or hydroureter. The bladder is unremarkable. There is no excretion of contrast on the delayed images into the collecting system. Stomach/Bowel: The stomach is grossly unremarkable. The patient is status post right hemicolectomy with a sutures identified in the mid abdomen. 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 This communicates with a 7.4 cm by 5.7 cm by 6.9 cm collection in the anterior abdominal wall. There is mild thickening of multiple loops of small bowel in the lower mid abdomen. There are scattered colonic diverticula without evidence of acute diverticulitis. Vascular/Lymphatic: There is scattered calcified atherosclerotic plaque throughout the nonaneurysmal abdominal aorta. The main portal and splenic veins are patent. There is no abdominal or pelvic lymphadenopathy. Reproductive: The uterus  and adnexa are unremarkable. Other: There is small volume ascites scattered throughout the abdomen. Haziness in the mesenteric fat likely reflects inflammatory change. There is no free intraperitoneal air. There is extensive gas tracking throughout the soft tissues of the abdomen into the right back and axilla. No other organized or drainable fluid collections are identified. Musculoskeletal: There is no acute osseous abnormality. IMPRESSION: 1. Status post right hemicolectomy with a suture site in the mid abdomen. 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 The fistula is in communication with a focal collection with layering debris measuring up to 7.4 cm in the abdominal wall consistent with abscess. 2. Extensive soft tissue emphysema tracking throughout the body wall may be  coming from the bowel, though necrotizing fasciitis would have a similar appearance. This was discussed with Dr Roderic Palau as below. 3. Mild inflammatory change of loops of small bowel and scattered ascites throughout the abdomen, likely reactive. No free intraperitoneal air is identified. 4. Absence of contrast in the renal collecting systems on the delayed phase images; correlate with renal function. 5. Small bilateral pleural effusions. These results were called by telephone at the time of interpretation on 07/18/2021 at 10:34 am to provider [facility] ZAMMIT , who verbally acknowledged these results. Electronically Signed   By: Valetta Mole M.D.   On: 07/18/2021 10:38   DG Chest Port 1 View  Result Date: 07/27/2021 CLINICAL DATA:  PICC line placement EXAM: PORTABLE CHEST 1 VIEW COMPARISON:  07/19/2021, 07/18/2021, FINDINGS: Interval right upper extremity central venous catheter with tip visible over SVC. Small left pleural effusion with slight increased left basilar consolidation. Cardiomegaly with mild central  congestion. No pneumothorax. Previously noted left sided central venous catheter has been removed IMPRESSION: 1. Removal of left-sided central venous catheter. New right upper extremity central venous catheter with tip over the SVC. 2. Cardiomegaly with vascular congestion, small left effusion and left basilar consolidation. Electronically Signed   By: Donavan Foil M.D.   On: 07/27/2021 18:20   DG CHEST PORT 1 VIEW  Result Date: 07/19/2021 CLINICAL DATA:  PICC placement. EXAM: PORTABLE CHEST 1 VIEW COMPARISON:  Radiograph earlier today. FINDINGS: Patient is rotated. The previous right upper extremity PICC tip is in the region of the distal brachiocephalic vein/SVC confluence. There is a new left upper extremity PICC with tip overlying the region of the mid SVC. No pneumothorax unchanged cardiomegaly. Irregular left basilar opacities are similar. There is increasing streaky right basilar atelectasis. No visualized pneumothorax. Soft tissue emphysema noted in the right axilla, also seen on abdominopelvic CT 07/18/2021. IMPRESSION: 1. Tip of the new left upper extremity PICC in the mid SVC. Previous right upper extremity PICC tip in the region of the brachiocephalic SVC confluence. 2. Unchanged cardiomegaly and irregular left basilar opacities. Increasing streaky right basilar atelectasis. 3. Soft tissue emphysema in the right axilla, also seen on abdominopelvic CT 07/18/2021. Electronically Signed   By: Keith Rake M.D.   On: 07/19/2021 18:37   DG Chest Port 1 View  Result Date: 07/19/2021 CLINICAL DATA:  COVID-19 Abdominal pain EXAM: PORTABLE ABDOMEN - 1 VIEW; PORTABLE CHEST - 1 VIEW COMPARISON:  Chest radiograph 07/18/2021 FINDINGS: Chest: Right upper extremity PICC terminates in the region of the upper superior vena cava. Heart is mildly enlarged. No significant pulmonary vascular congestion. Mild opacities at the left lung base favored to be atelectasis/scarring. Lungs are otherwise clear. Abdomen:  Subcutaneous emphysema again seen throughout the abdominal and pelvic walls, similar to CT of the abdomen and pelvis from 07/18/2021. No dilated loops of bowel to indicate ileus or obstruction. There is levoconvex curvature of the lumbar spine. IMPRESSION: 1. Mild cardiomegaly. 2. Left basilar opacity favored to be atelectasis. 3. Redemonstration of diffuse abdominal wall emphysema. 4. Nonobstructive bowel gas pattern. Electronically Signed   By: Miachel Roux M.D.   On: 07/19/2021 07:46   DG CHEST PORT 1 VIEW  Result Date: 07/18/2021 CLINICAL DATA:  PICC line placement EXAM: PORTABLE CHEST 1 VIEW COMPARISON:  07/18/2021 FINDINGS: Right upper extremity PICC line is been placed with its tip overlying the expected superior vena cava. Lung volumes are small. Small left pleural effusion is stable. No pneumothorax. Cardiac size is mildly enlarged, unchanged. Pulmonary vascularity  is normal. IMPRESSION: Right upper extremity PICC line tip within the superior vena cava. Small left pleural effusion, unchanged. Electronically Signed   By: Fidela Salisbury M.D.   On: 07/18/2021 22:34   DG Chest Portable 1 View  Result Date: 07/18/2021 CLINICAL DATA:  PICC line placement. EXAM: PORTABLE CHEST 1 VIEW COMPARISON:  July 18, 2021. FINDINGS: Stable cardiomediastinal silhouette. Interval placement of right-sided PICC line with distal tip in expected position of the SVC. Mild bibasilar subsegmental atelectasis is noted with small pleural effusions. Bony thorax is unremarkable. IMPRESSION: Interval placement of right-sided PICC line with distal tip in expected position of the SVC. Mild bibasilar subsegmental atelectasis with small pleural effusions. Electronically Signed   By: Marijo Conception M.D.   On: 07/18/2021 15:01   DG Chest Port 1 View  Result Date: 07/18/2021 CLINICAL DATA:  Question sepsis EXAM: PORTABLE CHEST 1 VIEW COMPARISON:  Multiple chest radiographs, most recently 07/12/2021. KUB, 07/12/2021. FINDINGS:  Support lines: Interval removal of RIGHT upper extremity PICC. Overlying support needs. Cardiac silhouette enlargement, unchanged. Low lung volumes. The RIGHT lung is clear. LEFT basilar consolidation obscuring the cardiac apex, unchanged. No pleural effusion or pneumothorax. Appearance of subcutaneous gas tracking along the RIGHT chest wall, axilla and imaged RIGHT upper quadrant. No interval osseous abnormality. IMPRESSION: 1. Interval removal RIGHT upper extremity PICC 2. LEFT basilar atelectasis, unchanged. 3. Overlying density with appearance of tracking subcutaneous gas along the RIGHT axilla and upper quadrant. Clinical correlation is recommended. Electronically Signed   By: Michaelle Birks M.D.   On: 07/18/2021 08:00   DG CHEST PORT 1 VIEW  Result Date: 07/12/2021 CLINICAL DATA:  PICC line verification EXAM: PORTABLE CHEST 1 VIEW COMPARISON:  Portable exam 1352 hours compared to 06/28/2021 FINDINGS: RIGHT arm PICC line tip projects over SVC. Minimal enlargement of cardiac silhouette. Mediastinal contours and pulmonary vascularity normal. Subsegmental atelectasis LEFT base. Lungs otherwise clear. Tiny LEFT pleural effusion. No pneumothorax. IMPRESSION: LEFT basilar atelectasis and tiny pleural effusion. RIGHT arm PICC line tip projects over SVC. Electronically Signed   By: Lavonia Dana M.D.   On: 07/12/2021 14:27   DG Abd Portable 1V  Result Date: 07/19/2021 CLINICAL DATA:  COVID-19 Abdominal pain EXAM: PORTABLE ABDOMEN - 1 VIEW; PORTABLE CHEST - 1 VIEW COMPARISON:  Chest radiograph 07/18/2021 FINDINGS: Chest: Right upper extremity PICC terminates in the region of the upper superior vena cava. Heart is mildly enlarged. No significant pulmonary vascular congestion. Mild opacities at the left lung base favored to be atelectasis/scarring. Lungs are otherwise clear. Abdomen: Subcutaneous emphysema again seen throughout the abdominal and pelvic walls, similar to CT of the abdomen and pelvis from 07/18/2021. No  dilated loops of bowel to indicate ileus or obstruction. There is levoconvex curvature of the lumbar spine. IMPRESSION: 1. Mild cardiomegaly. 2. Left basilar opacity favored to be atelectasis. 3. Redemonstration of diffuse abdominal wall emphysema. 4. Nonobstructive bowel gas pattern. Electronically Signed   By: Miachel Roux M.D.   On: 07/19/2021 07:46   [place] EKG SITE RITE  Result Date: 07/27/2021 If Site Rite image not attached, placement could not be confirmed due to current cardiac rhythm.  [place] EKG SITE RITE  Result Date: 07/18/2021 If [facility] image not attached, placement could not be confirmed due to current cardiac rhythm.  [place] EKG SITE RITE  Result Date: 07/18/2021 If [facility] image not attached, placement could not be confirmed due to current cardiac rhythm.  [place] EKG SITE RITE  Result Date: 07/12/2021 If Site  Rite image not attached, placement could not be confirmed due to current cardiac rhythm.   The length of time of the face-to-face encounter was 70 minutes. More than 50% of time was spent counseling and coordination of care.     Thank you for this referral.

## 2021-07-30 NOTE — Progress Notes (Signed)
PHARMACY - TOTAL PARENTERAL NUTRITION CONSULT NOTE  Indication: Fistula  Patient Measurements: Height: '5\' 2"'$  (157.5 cm) Weight: 123.7 kg (272 lb 11.3 oz) IBW/kg (Calculated) : 50.1 TPN AdjBW (KG): 66.6 Body mass index is 49.88 kg/m.  Assessment:  22 YOF with recent R-hemicolectomy on 8/12 in the setting of a cecal mass - consistent with R-colon metastatic adenocarcinoma. 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 Pharmacy consulted to manage TPN for nutritional support in the setting of a colocutaneous fistula.     Glucose / Insulin: CBGs <120, off SSI 07/22/21 Electrolytes: all WNL except K 3.7 (goal >/=2 for Afib), Mag 2.1 (goal >/= 2 for Afib), CL mild high Renal:  SCr < 1, BUN WNL Hepatic: LFTs / tibili / TG WNL, albumin 1.1 Intake / Output; MIVF:  UOP - 1x uncharted, ostomy 65m   GI Imaging: 8/24 Abd CT: colocutaneous fistula, 7.4cm abd wall abscess, soft tissue edema, cellulitis vs nec fasciitis 8/25 Abd xray: diffuse abd wall emphysema, nonobstructive bowel gas pattern 9/4 CT abd/pelvis: medial upper left thigh hematoma GI Surgeries / Procedures:  8/12 (last admit): R-hemicolectomy  Central access: PICC replaced 07/27/21 TPN start date: 07/19/21  Nutritional Goals (RD assessment on 8/25): 2300-2500 kcal, 140-160g AA, fluid >/= 2L per day  Current Nutrition:  TPN  Plan:  Continue TPN at goal rate of 105 mL/hr, providing 151g AA and 2373 kCal meeting 100% of estimated needs Electrolytes in TPN: Na 125 mEq/L, increase K to 523m/L, Ca 33m45mL, Mg 53m79m, Phos 22mm59m, Cl:Ac 1:1 Add standard MVI and trace elements to TPN Standard TPN labs Mon/Thurs  KCl 10 meq IV q1h x3  Thank you for involving pharmacy in this patient's care.  BenjaAlbertina ParrrmD., BCPS, BCCCP Clinical Pharmacist Please refer to [facility]ist

## 2021-07-30 NOTE — Plan of Care (Signed)
  Problem: Fluid Volume: Goal: Hemodynamic stability will improve Outcome: Not Progressing   Problem: Clinical Measurements: Goal: Diagnostic test results will improve Outcome: Not Progressing Goal: Signs and symptoms of infection will decrease Outcome: Not Progressing   Problem: Respiratory: Goal: Ability to maintain adequate ventilation will improve Outcome: Not Progressing   Problem: Education: Goal: Knowledge of General Education information will improve Description: Including pain rating scale, medication(s)/side effects and non-pharmacologic comfort measures Outcome: Not Progressing   Problem: Health Behavior/Discharge Planning: Goal: Ability to manage health-related needs will improve Outcome: Not Progressing   Problem: Clinical Measurements: Goal: Ability to maintain clinical measurements within normal limits will improve Outcome: Not Progressing Goal: Will remain free from infection Outcome: Not Progressing Goal: Diagnostic test results will improve Outcome: Not Progressing Goal: Respiratory complications will improve Outcome: Not Progressing Goal: Cardiovascular complication will be avoided Outcome: Not Progressing   Problem: Activity: Goal: Risk for activity intolerance will decrease Outcome: Not Progressing   Problem: Nutrition: Goal: Adequate nutrition will be maintained Outcome: Not Progressing   Problem: Coping: Goal: Level of anxiety will decrease Outcome: Not Progressing   Problem: Elimination: Goal: Will not experience complications related to bowel motility Outcome: Not Progressing Goal: Will not experience complications related to urinary retention Outcome: Not Progressing   Problem: Pain Managment: Goal: General experience of comfort will improve Outcome: Not Progressing   Problem: Safety: Goal: Ability to remain free from injury will improve Outcome: Not Progressing   Problem: Skin Integrity: Goal: Risk for impaired skin integrity  will decrease Outcome: Not Progressing   Problem: Education: Goal: Knowledge about tracheostomy care/management will improve Outcome: Not Progressing   Problem: Activity: Goal: Ability to tolerate increased activity will improve Outcome: Not Progressing   Problem: Health Behavior/Discharge Planning: Goal: Ability to manage tracheostomy will improve Outcome: Not Progressing   Problem: Respiratory: Goal: Patent airway maintenance will improve Outcome: Not Progressing   Problem: Role Relationship: Goal: Ability to communicate will improve Outcome: Not Progressing

## 2021-07-30 NOTE — Progress Notes (Signed)
H/H resent with stat order @ 2300. RN called to check status of result and was informed the specimen was not received or processed. Additional specimen sent now to lab for post-transfusion h/h recheck.

## 2021-07-30 NOTE — Progress Notes (Addendum)
PROGRESS NOTE  Lauren [name] GYI:[id number] DOB: 1946-10-05 DOA: 07/18/2021 PCP: Pcp, No   LOS: 12 days   Brief narrative: Patient is 75 years old female with past medical history of atrial fibrillation, combined heart failure, hypertension, lymphedema, recent admission to the hospital for GI bleed secondary to cecal mass status post right hemicolectomy on 07/06/2021 at outside hospital.  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  She was also found to be COVID positive.  Patient was then brought in hospital and received IV fluids vasopressors antibiotic.  CT scan of the abdomen showed the fistula extending from the bowel adjacent to the suture through an umbilical hernia into the abdominal wall with fluid collection in the anterior abdominal wall and gas tracking.  Patient was admitted to the ICU on 07/18/2021 and was started on vasopressors and antibiotic.  On 07/19/21 patient was started on TPN and was taken off of Levophed.  Subsequently, patient was considered stable for transfer out of ICU.   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  T3 N2 M0 status.  CT scan showing some fistula with abdominal wall collection.  Initially thought to be abscess but when the midline wound was opened stool was noted to be present. General surgery continues to follow.  Patient was placed on bowel rest and TPN was initiated.  Remains on Zosyn. CT scan of the abdomen done 9/4 does not show any new fluid collection except for the left-sided thigh hematoma.  Extensive subcutaneous emphysema was noted as before and thought to be less compared to previous films.   No further episodes of fever noted. WBC however remains elevated.   Patient remains NPO.  Discussed with general surgery today.  They might consider initiating liquids by mouth depending on output from the fistula. Continue TPN for now.  Dilaudid for pain control.  Electrolytes being addressed by pharmacy. Discussed with Dr. Chryl Heck with medical oncology yesterday who will consult on this patient to give [place] an idea overall prognosis related to patient's colon cancer in the setting of 1 positive lymph node.  Acute blood loss anemia  Drop in hemoglobin was due to hematoma in the left thigh.  Unclear as to the reason behind the hematoma.  No falls or injuries were reported.  Patient was aggressively transfused PRBCs.  Has received 4 units so far.  Hemoglobin noted to be 7.4 today.  Anticoagulation was held.  Recheck hemoglobin later today.  Left thigh hematoma/pain in the left hip area Noted to have hematoma in the left upper thigh muscles as well as possibly in the left psoas muscle.  Pain medications as needed.  Will hold anticoagulation.  Discussed with orthopedics.  No role for surgical intervention currently.  Holding anticoagulation.   Permanent atrial fibrillation Patient was on Xarelto as outpatient.  Was switched over to Lovenox here in the hospital.  Heart rate remain poorly controlled despite being on metoprolol initially and subsequently on Cardizem infusion.  Subsequently cardiology was consulted due to poorly controlled heart rate and low blood pressures.  Started on amiodarone.  Was given digoxin as well.  Cardiology continues to follow.  Lovenox on hold due to acute bleed.  Chronic systolic CHF Care everywhere was reviewed.  She had an echocardiogram in August 2022 at [facility] which  showed EF to be 30%. Continue to monitor volume status.  CT scan of the abdomen does raise concern for anasarca. Per cardiology patient is not a candidate for invasive or aggressive cardiovascular care.  They may revisit this depending on patient's prognosis from her cancer  standpoint. Patient was given furosemide after transfusion.  May repeat another small dose today depending on her afternoon hemoglobin.  Septic shock  Patient seems to be stable from a sepsis standpoint.  Has been off of vasopressors.  Remains on Zosyn.  Cultures have been negative.    Acute kidney injury Resolved.   Acute delirium Appears to have improved.  Continue Haldol as needed.     Incidental COVID 19 positive. Completed remdesivir.  On room air.  Patient tested positive on 8/24.  Now off of isolation.   Hyponatremia/hypokalemia/hypomagnesemia Sodium is stable.  Potassium, magnesium and phosphorus being addressed by pharmacy.   Goals of care Palliative care met with the patient and spoke to patient's brother who is the next of kin.  Patient does have a daughter but they are not in touch with each other.  Patient was changed over to DNR/DNI based on this conversation.    DVT prophylaxis: Currently off of Lovenox due to acute bleed.  She has chronic lymphedema with large lower extremities which will make it difficult for her to wear TED stockings or even for [place] to apply SCDs  Code Status: Full code  Family Communication:  No family at bedside.  Patient's brother being updated periodically.  We will update him today.  Status is: Inpatient  Remains inpatient appropriate because: IV treatments appropriate due to intensity of illness or inability to take PO, and Inpatient level of care appropriate due to severity of illness  Dispo: The patient is from: SNF              Anticipated d/c is to: SNF              Patient currently is not medically stable to d/c.   Difficult to place patient No   Consultants: General surgery PCCM  Procedures: PICC line placement  Anti-infectives:  Remdesivir- completed Zosyn IV   Subjective: Remains anxious but in better mood than before.  Denies any shortness of breath chest pain.  Dull pain in the left thigh area appears to be  better.  Able to move that left lower extremity better than before.   Objective: Vitals:   07/30/21 0300 07/30/21 0748  BP: (!) 117/32 118/69  Pulse: 86 87  Resp: 20 20  Temp: 97.8 F (36.6 C) 97.6 F (36.4 C)  SpO2: 96% 97%    Intake/Output Summary (Last 24 hours) at 07/30/2021 1109 Last data filed at 07/30/2021 0603 Gross per 24 hour  Intake 3437.6 ml  Output --  Net 3437.6 ml    Filed Weights   07/25/21 0307 07/26/21 0340 07/26/21 1333  Weight: 124.3 kg 126.8 kg 123.7 kg   Body mass index is 49.88 kg/m.   Physical Exam:  General appearance: Awake alert.  In no distress.  Less distracted today. Resp: Clear to auscultation bilaterally.  Normal effort Cardio: S1-S2 is irregularly irregular.  No S3-S4. GI: Abdomen is soft.  Eakin pouch noted with stool draining into it.  Midline wound.  Mildly tender without any rebound rigidity or guarding. Extremities: Chronic lymphedema bilateral lower extremities. Neurologic: No focal neurological deficits.      Data Review: I have personally reviewed the following laboratory data and studies,  CBC:  Recent Labs  Lab 07/28/21 0813 07/28/21 1114 07/28/21 1345 07/29/21 0500 07/29/21 1404 07/30/21 0218  WBC 14.4* 8.1 16.2* 15.4*  --  14.2*  HGB 5.2* 4.0* 5.4* 6.8* 7.0* 7.4*  HCT 18.2* 12.7* 19.4* 22.1* 22.2* 23.7*  MCV 81.3 100.0 83.3 83.1  --  85.9  PLT 167 106* 171 164  --  138*    Basic Metabolic Panel: Recent Labs  Lab 07/26/21 0615 07/27/21 0951 07/28/21 0813 07/29/21 0500 07/30/21 0218  NA 137  --  140 140 141  K 4.1  --  3.7 3.7 3.7  CL 108  --  111 112* 110  CO2 24  --  26 28 25   GLUCOSE 94  --  117* 115* 105*  BUN 18  --  13 17 20   CREATININE 0.56  --  0.59 0.47 0.53  CALCIUM 8.0*  --  7.7* 7.6* 7.9*  MG 1.6* 1.9  --  2.1 2.1  PHOS 3.3  --   --   --  3.2    Liver Function Tests: Recent Labs  Lab 07/26/21 0615 07/28/21 0813 07/30/21 0218  AST 34 33 23  ALT 17 18 15   ALKPHOS 111 89 92  BILITOT  0.5 0.5 0.9  PROT 5.8* 4.9* 4.8*  ALBUMIN 1.5* 1.1* 1.1*        Studies: CT ABDOMEN PELVIS W CONTRAST  Result Date: 07/29/2021 CLINICAL DATA:  Inpatient. Abdominal pain. Concern for abscess. Hemoglobin drop. Right hemicolectomy 07/06/2021 for colon cancer. EXAM: CT ABDOMEN AND PELVIS WITH CONTRAST TECHNIQUE: Multidetector CT imaging of the abdomen and pelvis was performed using the standard protocol following bolus administration of intravenous contrast. CONTRAST:  116m OMNIPAQUE IOHEXOL 350 MG/ML SOLN COMPARISON:  07/18/2021 CT abdomen/pelvis. FINDINGS: Lower chest: Mild-to-moderate bibasilar atelectasis with small dependent bilateral pleural effusions, left greater than right. Hepatobiliary: Mildly heterogeneous liver parenchymal enhancement without discrete liver masses. Slightly irregular liver surface, cannot exclude cirrhosis. Cholecystectomy. Bile ducts are stable and within normal post cholecystectomy limits with CBD diameter 7 mm. Pancreas: Normal, with no mass or duct dilation. Spleen: Normal size. No mass. Adrenals/Urinary Tract: Normal adrenals. No hydronephrosis. Subcentimeter hypodense interpolar left renal cortical lesion is too small to characterize and unchanged, requiring no follow-up. No suspicious renal masses. Normal bladder. Stomach/Bowel: Normal non-distended stomach. Status post right hemicolectomy with intact appearing ileocolic anastomosis in the right abdomen. No dilated or thick-walled small bowel loops. Moderate sigmoid diverticulosis. No large bowel wall thickening or significant pericolonic fat stranding in the remnant large-bowel. Vascular/Lymphatic: Atherosclerotic nonaneurysmal abdominal aorta. Patent portal, splenic, hepatic and renal veins. No pathologically enlarged lymph nodes in the abdomen or pelvis. Reproductive: No adnexal masses.  Stable mildly enlarged uterus. Other: No pneumoperitoneum. Extensive subcutaneous emphysema throughout the bilateral ventral  abdominopelvic wall, right greater than left, decreased in the interval. Worsened anasarca. No discrete drainable superficial fluid collection. No intraperitoneal fluid collections. Musculoskeletal: No aggressive appearing focal osseous lesions. Marked lumbar spondylosis. New heterogeneous hyperdense collection in the medial upper left thigh muscles (series 3/image 103) spanning up to 14.1 x 9.4 cm. New mild asymmetric enlargement and heterogeneous low-attenuation within the left psoas muscle (series 3/image 50). IMPRESSION: 1. New heterogeneous hyperdense collection in the medial upper left thigh muscles, spanning up to 14.1 x 9.4 cm, compatible with intramuscular hematoma. 2. New mild asymmetric enlargement and heterogeneous low-attenuation within the left psoas muscle, suspect a subacute intramuscular hematoma. 3. No intraperitoneal collections. Ileocolic anastomosis appears intact in the right abdomen status post right hemicolectomy. No bowel  obstruction. 4. Extensive subcutaneous emphysema throughout the bilateral ventral abdominopelvic wall, right greater than left, decreased in the interval. No discrete drainable superficial fluid collection. 5. Worsened anasarca. Small dependent bilateral pleural effusions, left greater than right. 6. Mildly irregular liver surface, cannot exclude cirrhosis. No discrete liver masses. 7. Moderate sigmoid diverticulosis. 8. Aortic Atherosclerosis (ICD10-I70.0). Electronically Signed   By: Ilona Sorrel M.D.   On: 07/29/2021 08:33      Bonnielee Haff, MD  [facility] 07/30/2021  If 7PM-7AM, please contact night-coverage

## 2021-07-31 ENCOUNTER — Inpatient Hospital Stay (HOSPITAL_COMMUNITY): Payer: Medicare HMO

## 2021-07-31 DIAGNOSIS — I4891 Unspecified atrial fibrillation: Secondary | ICD-10-CM | POA: Diagnosis not present

## 2021-07-31 DIAGNOSIS — C189 Malignant neoplasm of colon, unspecified: Secondary | ICD-10-CM | POA: Diagnosis not present

## 2021-07-31 DIAGNOSIS — I5021 Acute systolic (congestive) heart failure: Secondary | ICD-10-CM | POA: Diagnosis not present

## 2021-07-31 DIAGNOSIS — N179 Acute kidney failure, unspecified: Secondary | ICD-10-CM | POA: Diagnosis not present

## 2021-07-31 DIAGNOSIS — D62 Acute posthemorrhagic anemia: Secondary | ICD-10-CM | POA: Diagnosis not present

## 2021-07-31 LAB — BASIC METABOLIC PANEL
Anion gap: 3 — ABNORMAL LOW (ref 5–15)
BUN: 22 mg/dL (ref 8–23)
CO2: 24 mmol/L (ref 22–32)
Calcium: 7.8 mg/dL — ABNORMAL LOW (ref 8.9–10.3)
Chloride: 115 mmol/L — ABNORMAL HIGH (ref 98–111)
Creatinine, Ser: 0.5 mg/dL (ref 0.44–1.00)
GFR, Estimated: >60 mL/min (ref >60)
Glucose, Bld: 95 mg/dL (ref 70–99)
Potassium: 4.3 mmol/L (ref 3.5–5.1)
Sodium: 142 mmol/L (ref 135–145)

## 2021-07-31 LAB — CBC
HCT: 25.9 % — ABNORMAL LOW (ref 36.0–46.0)
Hemoglobin: 7.9 g/dL — ABNORMAL LOW (ref 12.0–15.0)
MCH: 26.6 pg (ref 26.0–34.0)
MCHC: 30.5 g/dL (ref 30.0–36.0)
MCV: 87.2 fL (ref 80.0–100.0)
Platelets: 143 10*3/uL — ABNORMAL LOW (ref 150–400)
RBC: 2.97 MIL/uL — ABNORMAL LOW (ref 3.87–5.11)
RDW: 25.6 % — ABNORMAL HIGH (ref 11.5–15.5)
WBC: 12.2 10*3/uL — ABNORMAL HIGH (ref 4.0–10.5)
nRBC: 0 % (ref 0.0–0.2)

## 2021-07-31 MED ORDER — FUROSEMIDE 10 MG/ML IJ SOLN
20.0000 mg | Freq: Once | INTRAMUSCULAR | Status: AC
  Administered 2021-07-31: 20 mg via INTRAVENOUS
  Filled 2021-07-31: qty 2

## 2021-07-31 MED ORDER — TRAVASOL 10 % IV SOLN
INTRAVENOUS | Status: AC
  Filled 2021-07-31: qty 1512

## 2021-07-31 MED ORDER — IOHEXOL 350 MG/ML SOLN
70.0000 mL | Freq: Once | INTRAVENOUS | Status: AC | PRN
  Administered 2021-07-31: 70 mL via INTRAVENOUS

## 2021-07-31 NOTE — Consult Note (Signed)
[facility] Nurse wound follow up Patient receiving care in [place]. Wound type: abdominal fistual Eakin pouch in good condition, however, red rubber catheter was occluded.  I was able to clear the catheter occlusion, re-tape the connections, and connected to continuous wall suction at 80 mmHg. Patient requesting to be repositioned. I notified the [place] in order that multiple staff can assist with the repositioning request. Additional Eakin supplies at the bedside. Val Riles, RN, MSN, CWOCN, CNS-BC, pager [phone]

## 2021-07-31 NOTE — Progress Notes (Signed)
PHARMACY - TOTAL PARENTERAL NUTRITION CONSULT NOTE  Indication: Fistula  Patient Measurements: Height: '5\' 2"'$  (157.5 cm) Weight: 123.7 kg (272 lb 11.3 oz) IBW/kg (Calculated) : 50.1 TPN AdjBW (KG): 66.6 Body mass index is 49.88 kg/m.  Assessment:  50 YOF with recent R-hemicolectomy on 8/12 in the setting of a cecal mass - consistent with R-colon metastatic adenocarcinoma. 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 Pharmacy consulted to manage TPN for nutritional support in the setting of a colocutaneous fistula.     Glucose / Insulin: CBGs <120, off SSI 07/22/21 Electrolytes: all WNL, K 4.3 (goal >/=4 for Afib), Mag 2.1 (goal >/= 2 for Afib), CL mild high Renal:  SCr < 1, BUN WNL Hepatic: LFTs / tibili / TG WNL, albumin 1.1 Intake / Output; MIVF:  UOP 1000 mL + 1x unmeasured, ostomy 32m   GI Imaging: 8/24 Abd CT: colocutaneous fistula, 7.4cm abd wall abscess, soft tissue edema, cellulitis vs nec fasciitis 8/25 Abd xray: diffuse abd wall emphysema, nonobstructive bowel gas pattern 9/4 CT abd/pelvis: medial upper left thigh hematoma GI Surgeries / Procedures:  8/12 (last admit): R-hemicolectomy  Central access: PICC replaced 07/27/21 TPN start date: 07/19/21  Nutritional Goals (RD assessment on 8/25): 2300-2500 kcal, 140-160g AA, fluid >/= 2L per day  Current Nutrition:  TPN  Plan:  Continue TPN at goal rate of 105 mL/hr, providing 151g AA and 2373 kCal meeting 100% of estimated needs Electrolytes in TPN: Na 125 mEq/L, K 55m/L, Ca 29m61mL, Mg 74m49m, Phos 22mm76m, Cl:Ac 1:1 Add standard MVI and trace elements to TPN Standard TPN labs Mon/Thurs  Thank you for involving pharmacy in this patient's care.  BenjaAlbertina ParrrmD., BCPS, BCCCP Clinical Pharmacist Please refer to [facility]-specific pharmacist

## 2021-07-31 NOTE — TOC Initial Note (Signed)
Transition of Care [facility]) - Initial/Assessment Note    Patient Details  Name: Lauren [name] MRN: [id number] Date of Birth: 1946-08-20  Transition of Care [facility]) CM/SW Contact:    Joanne Chars, LCSW Phone Number: 07/31/2021, 3:41 PM  Clinical Narrative:   CSW spoke with pt regarding recommendation for SNF.  Pt unwilling to commit to this currently, reports she lives at home alone, has support from a neighbor, and would prefer to discharge home.  CSW discussed with her the need for safe discharge and pt will only agree to "think about it" currently.  Pt acknowledged she had been admitted after DC to [facility] and pt initially states that she does not think she would want to go back to [place].  Permission given to speak with brother Legrand Como, sister Hassan Rowan.  Pt reports she has one daughter who lives out of state that she does not communicate with.  Pt reports she does not have PCP and none is listed in epic.  Pt was unsure if she has had covid vaccines.  Pt denies any DME in the home.    Pt is concerned that she has to pay her mortage, is unsure where her pocketbook ended up, asking for assistance.  CSW spoke with Jackelyn Poling at [place] who confirmed that she does still have pt pocketbook.  Jackelyn Poling will be at [facility] tomorrow and agreed to bring the pocketbook to pt at that time.  Pt informed.    TOC will continue to follow for DC needs.                  Expected Discharge Plan: Skilled Nursing Facility Barriers to Discharge: Continued Medical Work up, SNF Pending bed offer   Patient Goals and CMS Choice Patient states their goals for this hospitalization and ongoing recovery are:: "have my full life back"      Expected Discharge Plan and Services Expected Discharge Plan: [place]       Living arrangements for the past 2 months: Single Family Home                                      Prior Living Arrangements/Services Living arrangements  for the past 2 months: Single Family Home Lives with:: Self Patient language and need for interpreter reviewed:: Yes Do you feel safe going back to the place where you live?: Yes      Need for Family Participation in Patient Care: No (Comment) Care giver support system in place?: Yes (comment) Current home services: Other (comment) (na) Criminal Activity/Legal Involvement Pertinent to Current Situation/Hospitalization: No - Comment as needed  Activities of Daily Living      Permission Sought/Granted Permission sought to share information with : Family Supports Permission granted to share information with : Yes, Verbal Permission Granted  Share Information with NAME: brother Legrand Como, sister Hassan Rowan           Emotional Assessment Appearance:: Appears stated age Attitude/Demeanor/Rapport: Engaged Affect (typically observed): Appropriate, Pleasant Orientation: : Oriented to Self, Oriented to Place, Oriented to  Time, Oriented to Situation Alcohol / Substance Use: Not Applicable Psych Involvement: No (comment)  Admission diagnosis:  Abdominal pain [R10.9] Septic shock ([place]) [A41.9, R65.21] Sepsis ([place]) [A41.9] Acute sepsis ([place]) [A41.9] COVID-19 [U07.1] Patient Active Problem List   Diagnosis Date Noted   Septic shock ([place]) 07/18/2021   Sepsis ([place]) 07/18/2021   Enterocutaneous  fistula 07/18/2021   Cellulitis 07/18/2021   AKI (acute kidney injury) ([place]) 07/18/2021   Hyponatremia 07/18/2021   Colon carcinoma ([place])    Morbid obesity with BMI of 50.0-59.9, adult ([place]) 07/04/2021   Chronic combined systolic and diastolic CHF (congestive heart failure) ([place]) 07/04/2021   Constipation    Colonic mass    Iron deficiency anemia 06/29/2021   Elevated troponin 06/29/2021   Hypokalemia 06/29/2021   Rectal pain 06/29/2021   Chronic acquired lymphedema 02/17/2018   Essential hypertension with goal blood pressure less than 130/80 02/17/2018   Anxiety state 03/24/2014   Gallstone  pancreatitis 03/23/2014   Acute cholecystitis 12/06/2012   Atrial fibrillation with RVR ([place]) 12/06/2012   Pancreatitis, acute 12/04/2012   Hypothyroidism 12/04/2012   Obesity (BMI 35.0-39.9 without comorbidity) 12/04/2012   PCP:  Pcp, No Pharmacy:   [facility] - [place], [place] [address] Phone: [phone] Fax: [phone]  [place], [address] [address] Phone: [phone] Fax: [phone]     Social Determinants of Health (SDOH) Interventions    Readmission Risk Interventions Readmission Risk Prevention Plan 07/12/2021  Transportation Screening Complete  Home Care Screening Complete  Medication Review (RN CM) Complete  Some recent data might be hidden

## 2021-07-31 NOTE — Progress Notes (Signed)
Pharmacy Antibiotic Note  Lauren [name] is a 75 y.o. female admitted on 07/18/2021 with cellulitis.  Pharmacy was consulted  on 8/24 for Zosyn dosing.   She is s/p right hemicolectomy on 07/06/2021 at outside hospital.  Colocutaneous fistula. WBC 15.4 >14.2>12.2.  afebrile.  Surgery noting today patient having some mild RLQ abdominal pain that is overall improving. Surgery continuing Zosyn.    Plan: Continue Zosyn 3.375 g IV q8hr (4hr infusion).  Follow up with surgery for duration of antibiotic.  Monitor clinical status, renal function and culture results daily.   Height: '5\' 2"'$  (157.5 cm) Weight: 123.7 kg (272 lb 11.3 oz) IBW/kg (Calculated) : 50.1  Temp (24hrs), Avg:97.7 F (36.5 C), Min:97.5 F (36.4 C), Max:97.9 F (36.6 C)  Recent Labs  Lab 07/26/21 0615 07/27/21 0951 07/28/21 0813 07/28/21 1114 07/28/21 1345 07/29/21 0500 07/30/21 0218 07/30/21 1411 07/31/21 0101  WBC 13.1*   < > 14.4*   < > 16.2* 15.4* 14.2* 14.5* 12.2*  CREATININE 0.56  --  0.59  --   --  0.47 0.53  --  0.50   < > = values in this interval not displayed.     Estimated Creatinine Clearance: 79.8 mL/min (by C-G formula based on SCr of 0.5 mg/dL).    Allergies  Allergen Reactions   Codeine Itching   Compazine [Prochlorperazine Edisylate]    Prednisone Nausea And Vomiting   Tetanus Toxoids Other (See Comments)    Knot like "hen egg" came up    Vicodin [Hydrocodone-Acetaminophen] Itching    Antimicrobials this admission: Vancomycin 8/24 >>8/25 Anidul 8/24>> 8/25 Zosyn 8/24 >> 8/25, restarted 8/25>> cefepime 8/24 >> 8/24 Meropenem 8/25>>8/25 Flagyl 8/24 x 1 in ED Remdesivir 8/25-8/27  Microbiology results: 8/24 BCx: negative 08/24 MRSA PCR negative 08/24 C diff - 08/24 COVID +  Thank you for allowing pharmacy to participate in this patient's care. Nicole Cella, RPh Clinical Pharmacist [phone] 07/31/2021 12:47 PM Check [email or web address] for unit specific pharmacy number

## 2021-07-31 NOTE — Progress Notes (Addendum)
PROGRESS NOTE  Lauren [name] [id number]:[id number] DOB: May 10, 1946 DOA: 07/18/2021 PCP: Pcp, No   LOS: 13 days   Brief narrative: Patient is 75 years old female with past medical history of atrial fibrillation, combined heart failure, hypertension, lymphedema, recent admission to the hospital for GI bleed secondary to cecal mass status post right hemicolectomy on 07/06/2021 at outside hospital.  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  She was also found to be COVID positive.  Patient was then brought in hospital and received IV fluids vasopressors antibiotic.  CT scan of the abdomen showed the fistula extending from the bowel adjacent to the suture through an umbilical hernia into the abdominal wall with fluid collection in the anterior abdominal wall and gas tracking.  Patient was admitted to the ICU on 07/18/2021 and was started on vasopressors and antibiotic.  On 07/19/21 patient was started on TPN and was taken off of Levophed.  Subsequently, patient was considered stable for transfer out of ICU. Hospitalization complicated by atrial fibrillation with RVR.  New CHF.  Cardiology consulted.  Subsequently complicated by bleeding into the left thigh resulting in acute blood loss anemia requiring blood transfusions.   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  T3 N2 M0 status.  CT scan showing some fistula with abdominal wall collection.  Initially thought to be abscess but when the midline wound was opened stool was noted to be present. General surgery continues to follow.  Patient was placed on bowel rest and TPN was initiated.  Remains on Zosyn. CT scan of the abdomen done 9/4 does not show any new fluid collection except for the  left-sided thigh hematoma.  Extensive subcutaneous emphysema was noted as before and thought to be less compared to previous films.  Remains afebrile.  WBC has been elevated though appears to be improving.  Patient remains on Zosyn.  Duration of antibiotic treatment is not entirely clear but will need to be determined in discussions with general surgery and based on clinical improvement. Per general surgery antibiotics can be stopped. Will stop Zosyn. Remains n.p.o.  TPN being continued.  Dilaudid for pain control.  Electrolytes being addressed by pharmacy.  Colon cancer/cecal mass Right hemicolectomy on 07/06/21 (done at [facility]) for cecal mass with adenocarcinoma by pathology.  T3 N2 M0 status.  Patient was seen by medical oncology (Dr. Chryl Heck) who recommended getting a CT chest to rule out pulmonary mets.  She feels that if the CT chest did not show any evidence for metastatic process her prognosis could be reasonably good. We will go ahead and order CT chest.  Acute blood loss anemia  Drop in hemoglobin was due to hematoma in the left thigh.  Unclear as to the reason behind the hematoma.  No falls or injuries were reported.  Patient was aggressively transfused PRBCs.  Has received 4 units so far.   Hemoglobin responded.  Seems to be stable over the last 24 hours.  We will recheck hemoglobin this afternoon.  Anticoagulation remains on hold. Patient refused afternoon blood draw to check Hgb despite being told the importance of making sure the hemoglobin has not decreased. Will check CBC in AM. If there is a hemodynamic change in the meantime then it will need to be checked STAT.  Left  thigh hematoma/pain in the left hip area Noted to have hematoma in the left upper thigh muscles as well as possibly in the left psoas muscle.  Pain medications as needed.  Will hold anticoagulation.  Discussed with orthopedics (Dr. Ninfa Linden).  No role for surgical intervention currently.  Holding  anticoagulation.   Permanent atrial fibrillation Patient was on Xarelto as outpatient.  Was switched over to Lovenox here in the hospital.  Heart rate remain poorly controlled despite being on metoprolol initially and subsequently on Cardizem infusion.  Subsequently cardiology was consulted due to poorly controlled heart rate and low blood pressures.   Patient was subsequently started on amiodarone.  Was given digoxin as well.  Heart rate is reasonably well controlled. Lovenox on hold due to acute bleed.  If hemoglobin remains stable could consider resuming anticoagulation in 48 hrs however would start with heparin infusion.  Chronic systolic CHF Care everywhere was reviewed.  She had an echocardiogram in August 2022 at [facility] which showed EF to be 30%. Continue to monitor volume status.  CT scan of the abdomen does raise concern for anasarca. Cardiology is following.  No invasive work-up is planned as of now due to her tenuous status. Some concern for hypervolemia.  Could give her a small dose of furosemide today.  We will be limited by her blood pressures.  Septic shock  Patient seems to be stable from a sepsis standpoint.  Has been off of vasopressors.  Remains on Zosyn.  Cultures have been negative.    Acute kidney injury Resolved.   Acute delirium Appears to have improved.  Continue Haldol as needed.     Incidental COVID 19 positive. Completed remdesivir.  On room air.  Patient tested positive on 8/24.  Now off of isolation.   Hyponatremia/hypokalemia/hypomagnesemia Sodium is stable.  Potassium, magnesium and phosphorus being addressed by pharmacy.   Goals of care Palliative care met with the patient and spoke to patient's brother who is the next of kin.  Patient does have a daughter but they are not in touch with each other.  Patient was changed over to DNR/DNI based on this conversation.    DVT prophylaxis: Currently off of Lovenox due to acute bleed.  She has chronic lymphedema  with large lower extremities which will make it difficult for her to wear TED stockings or even for [place] to apply SCDs  Code Status: Full code  Family Communication:  No family at bedside.  Patient's brother being updated periodically.  Attempts to reach him yesterday were not successful  Status is: Inpatient  Remains inpatient appropriate because: IV treatments appropriate due to intensity of illness or inability to take PO, and Inpatient level of care appropriate due to severity of illness  Dispo: The patient is from: SNF              Anticipated d/c is to: SNF              Patient currently is not medically stable to d/c.   Difficult to place patient No   Consultants: General surgery PCCM  Procedures: PICC line placement  Anti-infectives:  Remdesivir- completed Zosyn IV   Subjective: Seems to be in better spirits this morning.  Continues to ask for something to eat and drink.  Abdominal and left hip pain is so well controlled.  No chest pain or shortness of breath.   Objective: Vitals:   07/31/21 0421 07/31/21 0750  BP: (!) 103/56 (!) 112/55  Pulse: (!) 102 67  Resp: 18 20  Temp: 97.6 F (36.4 C) (!) 97.5 F (36.4 C)  SpO2: 95% 96%    Intake/Output Summary (Last 24 hours) at 07/31/2021 1017 Last data filed at 07/31/2021 0416 Gross per 24 hour  Intake 2279.34 ml  Output 1000 ml  Net 1279.34 ml    Filed Weights   07/25/21 0307 07/26/21 0340 07/26/21 1333  Weight: 124.3 kg 126.8 kg 123.7 kg   Body mass index is 49.88 kg/m.   Physical Exam:  General appearance: Awake alert.  In no distress Resp: Normal effort at rest.  Diminished air entry at the bases.  No wheezing or rhonchi.  Few crackles. Cardio: S1-S2 is irregularly irregular GI: Abdomen is soft.  Mildly tender.  Eakin pouch with stool is noted.   Extremities: Chronic lymphedema.  Limited range of motion in the left side due to recent hematoma. Neurologic: Alert and oriented x3.  No focal neurological  deficits.        Data Review: I have personally reviewed the following laboratory data and studies,  CBC: Recent Labs  Lab 07/28/21 1345 07/29/21 0500 07/29/21 1404 07/30/21 0218 07/30/21 1411 07/31/21 0101  WBC 16.2* 15.4*  --  14.2* 14.5* 12.2*  HGB 5.4* 6.8* 7.0* 7.4* 8.4* 7.9*  HCT 19.4* 22.1* 22.2* 23.7* 26.3* 25.9*  MCV 83.3 83.1  --  85.9 84.8 87.2  PLT 171 164  --  138* 148* 143*    Basic Metabolic Panel: Recent Labs  Lab 07/26/21 0615 07/27/21 0951 07/28/21 0813 07/29/21 0500 07/30/21 0218 07/31/21 0101  NA 137  --  140 140 141 142  K 4.1  --  3.7 3.7 3.7 4.3  CL 108  --  111 112* 110 115*  CO2 24  --  26 28 25 24   GLUCOSE 94  --  117* 115* 105* 95  BUN 18  --  13 17 20 22   CREATININE 0.56  --  0.59 0.47 0.53 0.50  CALCIUM 8.0*  --  7.7* 7.6* 7.9* 7.8*  MG 1.6* 1.9  --  2.1 2.1  --   PHOS 3.3  --   --   --  3.2  --     Liver Function Tests: Recent Labs  Lab 07/26/21 0615 07/28/21 0813 07/30/21 0218  AST 34 33 23  ALT 17 18 15   ALKPHOS 111 89 92  BILITOT 0.5 0.5 0.9  PROT 5.8* 4.9* 4.8*  ALBUMIN 1.5* 1.1* 1.1*        Studies: No results found.    Bonnielee Haff, MD  [facility] 07/31/2021  If 7PM-7AM, please contact night-coverage

## 2021-07-31 NOTE — Care Management Important Message (Signed)
Important Message  Patient Details  Name: Lauren [name] MRN: [id number] Date of Birth: 27-Mar-1946   Medicare Important Message Given:  Yes     Orbie Pyo 07/31/2021, 2:40 PM

## 2021-07-31 NOTE — Progress Notes (Signed)
Subjective: In good spirits this am. Having some mild RLQ abdominal pain that is overall improving over time and eager to advance diet and drink water. She denies nausea/emesis. She is not sure if she has had a BM recently.   Discussed with nursing and last confirmed BM was on Friday. Eakins pouch output has not been well documented and 100 ml in cannister this am  Objective: Vital signs in last 24 hours: Temp:  [97.5 F (36.4 C)-97.9 F (36.6 C)] 97.5 F (36.4 C) (09/06 0750) Pulse Rate:  [67-102] 67 (09/06 0750) Resp:  [18-20] 20 (09/06 0750) BP: (103-132)/(39-84) 112/55 (09/06 0750) SpO2:  [94 %-97 %] 96 % (09/06 0750) Last BM Date: 07/26/21  Intake/Output from previous day: 09/05 0701 - 09/06 0700 In: 2279.3 [I.V.:2045.6; IV Piggyback:233.8] Out: 1000 [Urine:1000] Intake/Output this shift: No intake/output data recorded.  PE: Gen:  Alert, NAD, pleasant Card:  Reg rate Pulm: respiratory effort nonlabored on room air. Regular rate Abd: Soft, distension difficult to assess given body habitus, very mild tenderness RLQ - no rebound or guarding, midline wound covered with eakin's pouch and suction with stool in pouch and cannister - 100 mL Msk: MAE's. Bilateral LE edema Skin: no rashes noted, warm and dry  Lab Results:  Recent Labs    07/30/21 1411 07/31/21 0101  WBC 14.5* 12.2*  HGB 8.4* 7.9*  HCT 26.3* 25.9*  PLT 148* 143*    BMET Recent Labs    07/30/21 0218 07/31/21 0101  NA 141 142  K 3.7 4.3  CL 110 115*  CO2 25 24  GLUCOSE 105* 95  BUN 20 22  CREATININE 0.53 0.50  CALCIUM 7.9* 7.8*    PT/INR No results for input(s): LABPROT, INR in the last 72 hours. CMP     Component Value Date/Time   NA 142 07/31/2021 0101   K 4.3 07/31/2021 0101   CL 115 (H) 07/31/2021 0101   CO2 24 07/31/2021 0101   GLUCOSE 95 07/31/2021 0101   BUN 22 07/31/2021 0101   CREATININE 0.50 07/31/2021 0101   CALCIUM 7.8 (L) 07/31/2021 0101   PROT 4.8 (L) 07/30/2021  0218   ALBUMIN 1.1 (L) 07/30/2021 0218   AST 23 07/30/2021 0218   ALT 15 07/30/2021 0218   ALKPHOS 92 07/30/2021 0218   BILITOT 0.9 07/30/2021 0218   GFRNONAA >60 07/31/2021 0101   GFRAA >90 03/28/2014 0435   Lipase     Component Value Date/Time   LIPASE 25 03/27/2014 0600    Studies/Results: No results found.  Anti-infectives: Anti-infectives (From admission, onward)    Start     Dose/Rate Route Frequency Ordered Stop   07/20/21 1000  remdesivir 100 mg in sodium chloride 0.9 % 100 mL IVPB       See Hyperspace for full Linked Orders Report.   100 mg 200 mL/hr over 30 Minutes Intravenous Daily 07/19/21 1040 07/21/21 1148   07/20/21 0200  vancomycin (VANCOREADY) IVPB 750 mg/150 mL  Status:  Discontinued        750 mg 150 mL/hr over 60 Minutes Intravenous Every 24 hours 07/19/21 1036 07/19/21 1449   07/19/21 2300  anidulafungin (ERAXIS) 100 mg in sodium chloride 0.9 % 100 mL IVPB  Status:  Discontinued        100 mg 78 mL/hr over 100 Minutes Intravenous Every 24 hours 07/18/21 2107 07/19/21 1449   07/19/21 1600  vancomycin (VANCOREADY) IVPB 750 mg/150 mL  Status:  Discontinued  750 mg 150 mL/hr over 60 Minutes Intravenous Every 24 hours 07/19/21 0919 07/19/21 1016   07/19/21 1600  vancomycin (VANCOREADY) IVPB 750 mg/150 mL  Status:  Discontinued        750 mg 150 mL/hr over 60 Minutes Intravenous Every 24 hours 07/19/21 1016 07/19/21 1036   07/19/21 1400  piperacillin-tazobactam (ZOSYN) IVPB 3.375 g        3.375 g 12.5 mL/hr over 240 Minutes Intravenous Every 8 hours 07/19/21 1016     07/19/21 1130  remdesivir 200 mg in sodium chloride 0.9% 250 mL IVPB       See Hyperspace for full Linked Orders Report.   200 mg 580 mL/hr over 30 Minutes Intravenous Once 07/19/21 1040 07/19/21 1241   07/19/21 0100  metroNIDAZOLE (FLAGYL) IVPB 500 mg  Status:  Discontinued        500 mg 100 mL/hr over 60 Minutes Intravenous Every 12 hours 07/18/21 1203 07/18/21 1534   07/18/21 2200   ceFEPIme (MAXIPIME) 2 g in sodium chloride 0.9 % 100 mL IVPB  Status:  Discontinued        2 g 200 mL/hr over 30 Minutes Intravenous Every 12 hours 07/18/21 1156 07/18/21 1203   07/18/21 2200  meropenem (MERREM) 2 g in sodium chloride 0.9 % 100 mL IVPB  Status:  Discontinued        2 g 200 mL/hr over 30 Minutes Intravenous Every 12 hours 07/18/21 2104 07/19/21 1011   07/18/21 2200  anidulafungin (ERAXIS) 200 mg in sodium chloride 0.9 % 200 mL IVPB        200 mg 78 mL/hr over 200 Minutes Intravenous  Once 07/18/21 2108 07/19/21 0419   07/18/21 1800  clindamycin (CLEOCIN) IVPB 900 mg  Status:  Discontinued        900 mg 100 mL/hr over 30 Minutes Intravenous Every 8 hours 07/18/21 1534 07/18/21 2103   07/18/21 1600  vancomycin (VANCOREADY) IVPB 2000 mg/400 mL  Status:  Discontinued        2,000 mg 200 mL/hr over 120 Minutes Intravenous Every 24 hours 07/18/21 1544 07/19/21 0919   07/18/21 1545  piperacillin-tazobactam (ZOSYN) IVPB 3.375 g  Status:  Discontinued        3.375 g 12.5 mL/hr over 240 Minutes Intravenous Every 8 hours 07/18/21 1544 07/18/21 2104   07/18/21 1300  cefTRIAXone (ROCEPHIN) 2 g in sodium chloride 0.9 % 100 mL IVPB  Status:  Discontinued        2 g 200 mL/hr over 30 Minutes Intravenous Every 24 hours 07/18/21 1203 07/18/21 1628   07/18/21 1200  ceFEPIme (MAXIPIME) 2 g in sodium chloride 0.9 % 100 mL IVPB  Status:  Discontinued        2 g 200 mL/hr over 30 Minutes Intravenous Every 12 hours 07/18/21 1155 07/18/21 1156   07/18/21 0930  ceFEPIme (MAXIPIME) 2 g in sodium chloride 0.9 % 100 mL IVPB        2 g 200 mL/hr over 30 Minutes Intravenous  Once 07/18/21 0918 07/18/21 1135   07/18/21 0930  metroNIDAZOLE (FLAGYL) IVPB 500 mg  Status:  Discontinued        500 mg 100 mL/hr over 60 Minutes Intravenous  Once 07/18/21 [id number] 07/18/21 1534        Assessment/Plan 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 Arnoldo Morale at [place] on 8/12 -  path w/ adenocarcinoma  - Midline wound opened by Dr. Arnoldo Morale at bedside 8/24 and  now has stool decompressing through CC fistula  - Currently has eakin's pouch with suction to aid in collection of output. WOCN following - Keep NPO. TPN and monitor strict I/O (please document stool output). Output 100 ml in cannister this am. Otherwise none recorded since 8/30-8/31 - CT abd/pel 9/4 without intraperitoneal collections and ileocolic anastomosis appears intact - WBC downtrending - 12.2 (14.5)   FEN - NPO. TPN. IVF per primary VTE - SCDs, lovenox (currently held) ID - On Zosyn 8/24 >>. Afebrile. WBC 15.4 > 14.2> 12.2  Per primary:  COVID + - now off isolation Sepsis - off pressors, improved. Now on [facility] service.  CHF A. Fib on Xarelto at home - was on heparin gtt and now on lovenox ABL anemia - FOBT positive 8/24. Monitor. Hgb 7.9 (8.4) Hypothyroidism AKI - resolved Severe protein calorie malnutrition - albumin 1.4. Pre-alb <5. On TNA Adenocarcinoma of the cecum L thigh hematoma   LOS: 13 days    Winferd Humphrey , [facility] Surgery 07/31/2021, 8:38 AM Please see Amion for pager number during day hours 7:00am-4:30pm

## 2021-07-31 NOTE — Progress Notes (Signed)
Progress Note  Patient Name: Lauren [name] Date of Encounter: 07/31/2021  [facility] Cardiologist: Carlyle Dolly, MD   Subjective   Some abdominal discomfort but overall no chest pain, no significant shortness of breath.  Inpatient Medications    Scheduled Meds:  Chlorhexidine Gluconate Cloth  6 each Topical Daily   furosemide  20 mg Intravenous Once   levothyroxine  50 mcg Intravenous Daily   mouth rinse  15 mL Mouth Rinse BID   pantoprazole (PROTONIX) IV  40 mg Intravenous Q12H   sodium chloride flush  10-40 mL Intracatheter Q12H   Continuous Infusions:  amiodarone 30 mg/hr (07/31/21 0208)   methocarbamol (ROBAXIN) IV 500 mg (07/25/21 2343)   piperacillin-tazobactam (ZOSYN)  IV 3.375 g (07/31/21 0510)   TPN ADULT (ION) 105 mL/hr at 07/31/21 0416   TPN ADULT (ION)     PRN Meds: diphenhydrAMINE, haloperidol lactate, HYDROmorphone (DILAUDID) injection **OR** [DISCONTINUED]  HYDROmorphone (DILAUDID) injection, methocarbamol (ROBAXIN) IV, ondansetron (ZOFRAN) IV, sodium chloride flush, sodium chloride flush   Vital Signs    Vitals:   07/30/21 2100 07/31/21 0035 07/31/21 0421 07/31/21 0750  BP: (!) 113/44 (!) 116/39 (!) 103/56 (!) 112/55  Pulse: 93 99 (!) 102 67  Resp: '18  18 20  '$ Temp: 97.9 F (36.6 C)  97.6 F (36.4 C) (!) 97.5 F (36.4 C)  TempSrc: Oral  Oral Oral  SpO2: 94% 94% 95% 96%  Weight:      Height:        Intake/Output Summary (Last 24 hours) at 07/31/2021 1137 Last data filed at 07/31/2021 0416 Gross per 24 hour  Intake 2279.34 ml  Output 1000 ml  Net 1279.34 ml   Last 3 Weights 07/26/2021 07/26/2021 07/25/2021  Weight (lbs) 272 lb 11.3 oz 279 lb 8.7 oz 274 lb 0.5 oz  Weight (kg) 123.7 kg 126.8 kg 124.3 kg      Telemetry    Atrial fibrillation reasonable rate control- Personally Reviewed  ECG    No new- Personally Reviewed  Physical Exam   Exam reviewed from earlier this morning Dr. Maryland Pink.  Heart rate is irregularly irregular  Labs     High Sensitivity Troponin:  No results for input(s): TROPONINIHS in the last 720 hours.    Chemistry Recent Labs  Lab 07/26/21 0615 07/28/21 0813 07/29/21 0500 07/30/21 0218 07/31/21 0101  NA 137 140 140 141 142  K 4.1 3.7 3.7 3.7 4.3  CL 108 111 112* 110 115*  CO2 '24 26 28 25 24  '$ GLUCOSE 94 117* 115* 105* 95  BUN '18 13 17 20 22  '$ CREATININE 0.56 0.59 0.47 0.53 0.50  CALCIUM 8.0* 7.7* 7.6* 7.9* 7.8*  PROT 5.8* 4.9*  --  4.8*  --   ALBUMIN 1.5* 1.1*  --  1.1*  --   AST 34 33  --  23  --   ALT 17 18  --  15  --   ALKPHOS 111 89  --  92  --   BILITOT 0.5 0.5  --  0.9  --   GFRNONAA >60 >60 >60 >60 >60  ANIONGAP 5 3* 0* 6 3*     Hematology Recent Labs  Lab 07/30/21 0218 07/30/21 1411 07/31/21 0101  WBC 14.2* 14.5* 12.2*  RBC 2.76* 3.10* 2.97*  HGB 7.4* 8.4* 7.9*  HCT 23.7* 26.3* 25.9*  MCV 85.9 84.8 87.2  MCH 26.8 27.1 26.6  MCHC 31.2 31.9 30.5  RDW 24.7* 25.2* 25.6*  PLT 138* 148* 143*  Patient Profile     75 y.o. female with atrial fibrillation, right hemicolectomy, colocutaneous fistula, colon cancer cecal mass, anemia, chronic systolic heart failure EF 30%, septic shock.  Assessment & Plan    Atrial fibrillation - Continue with IV amiodarone.  Blood pressures have been soft. -Chronic anticoagulation on hold secondary to acute bleed.  Agree with restarting heparin infusion if necessary.  Chronic systolic heart failure - EF 30% at [facility] in August.  CT scan does demonstrate evidence of anasarca however this could all be inflammatory response to current illness.  As was stated by Dr. Maryland Pink, I would not be opposed to small dose of furosemide today.  We careful with blood pressures.  DNR      For questions or updates, please contact [place] Please consult [email or web address] for contact info under        Signed, Candee Furbish, MD  07/31/2021, 11:37 AM

## 2021-07-31 NOTE — Progress Notes (Signed)
Lab tech went in to the room to draw an H&H, pt stated she did not want that lab tech to take her blood she wanted Otila Kluver. Pt was told Otila Kluver was at [facility] and would be back at 3pm. Approx 3:30 Otila Kluver returned to pts room to draw labs and pt refused to have her labs drawn. She stated she wanted water and ice before she would allow lab to collect blood. This nurse let her know that 1. Otila Kluver is not always working on this floor nor does she work everyday, therefore she would need to let other techs draw her labs and 2. That we do not make trades with pt care.

## 2021-08-01 DIAGNOSIS — L03311 Cellulitis of abdominal wall: Secondary | ICD-10-CM | POA: Diagnosis not present

## 2021-08-01 DIAGNOSIS — N179 Acute kidney failure, unspecified: Secondary | ICD-10-CM | POA: Diagnosis not present

## 2021-08-01 DIAGNOSIS — R6521 Severe sepsis with septic shock: Secondary | ICD-10-CM | POA: Diagnosis not present

## 2021-08-01 DIAGNOSIS — C189 Malignant neoplasm of colon, unspecified: Secondary | ICD-10-CM | POA: Diagnosis not present

## 2021-08-01 DIAGNOSIS — A419 Sepsis, unspecified organism: Secondary | ICD-10-CM | POA: Diagnosis not present

## 2021-08-01 LAB — BASIC METABOLIC PANEL
Anion gap: 5 (ref 5–15)
BUN: 25 mg/dL — ABNORMAL HIGH (ref 8–23)
CO2: 23 mmol/L (ref 22–32)
Calcium: 7.9 mg/dL — ABNORMAL LOW (ref 8.9–10.3)
Chloride: 112 mmol/L — ABNORMAL HIGH (ref 98–111)
Creatinine, Ser: 0.52 mg/dL (ref 0.44–1.00)
GFR, Estimated: >60 mL/min (ref >60)
Glucose, Bld: 94 mg/dL (ref 70–99)
Potassium: 4.6 mmol/L (ref 3.5–5.1)
Sodium: 140 mmol/L (ref 135–145)

## 2021-08-01 LAB — CBC
HCT: 27.1 % — ABNORMAL LOW (ref 36.0–46.0)
Hemoglobin: 8.2 g/dL — ABNORMAL LOW (ref 12.0–15.0)
MCH: 27 pg (ref 26.0–34.0)
MCHC: 30.3 g/dL (ref 30.0–36.0)
MCV: 89.1 fL (ref 80.0–100.0)
Platelets: 147 10*3/uL — ABNORMAL LOW (ref 150–400)
RBC: 3.04 MIL/uL — ABNORMAL LOW (ref 3.87–5.11)
RDW: 26.2 % — ABNORMAL HIGH (ref 11.5–15.5)
WBC: 12.1 10*3/uL — ABNORMAL HIGH (ref 4.0–10.5)
nRBC: 0.2 % (ref 0.0–0.2)

## 2021-08-01 MED ORDER — FUROSEMIDE 10 MG/ML IJ SOLN
40.0000 mg | Freq: Once | INTRAMUSCULAR | Status: AC
  Administered 2021-08-01: 40 mg via INTRAVENOUS
  Filled 2021-08-01: qty 4

## 2021-08-01 MED ORDER — CHROMIC CHLORIDE 40 MCG/10ML IV SOLN
INTRAVENOUS | Status: AC
  Filled 2021-08-01: qty 1512

## 2021-08-01 NOTE — Progress Notes (Signed)
Physical Therapy Treatment Patient Details Name: Lauren [name] MRN: [id number] DOB: 11/23/1946 Today's Date: 08/01/2021    History of Present Illness 75 yo female with c/o constipation and rectal pain. 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 Pt s/p R hemicolectomy 07/06/21. PMH: a fib, morbid obesity, diastolic CHF, hypertension, lymphedema    PT Comments    Patient received in bed, agrees to get up with PT/OT this afternoon. Patient assisted to get sitting up to edge of bed with +2 mod assist. Stood with mod of +1, found to be soiled therefore needed to stand 1-2 more times in attempts to clean and change bed. By that time patient was exhausted and returned to supine with max +2 to finish getting cleaned up. She will continue to benefit from skilled PT while here to improve strength, mobility and independence.     Follow Up Recommendations  SNF;Supervision/Assistance - 24 hour     Equipment Recommendations  Other (comment) (TBD)    Recommendations for Other Services       Precautions / Restrictions Precautions Precautions: Fall Restrictions Weight Bearing Restrictions: No    Mobility  Bed Mobility Overal bed mobility: Needs Assistance Bed Mobility: Supine to Sit;Sit to Supine;Rolling Rolling: Max assist;+2 for physical assistance   Supine to sit: +2 for physical assistance;HOB elevated;Mod assist Sit to supine: Max assist;+2 for physical assistance   General bed mobility comments: Patient makes good effort to assist. Has very painful L LE.    Transfers Overall transfer level: Needs assistance Equipment used: 1 person hand held assist;None Transfers: Sit to/from Stand Sit to Stand: Mod assist;From elevated surface         General transfer comment: able to stand quite well but can get panicky  Ambulation/Gait Ambulation/Gait assistance: Mod assist;+2 physical assistance Gait Distance (Feet): 2 Feet Assistive device: 2  person hand held assist Gait Pattern/deviations: Step-to pattern Gait velocity: decr   General Gait Details: she was able to take 1-2 steps along edge of bed. Very panicked during this   Stairs             Wheelchair Mobility    Modified Rankin (Stroke Patients Only)       Balance Overall balance assessment: Needs assistance Sitting-balance support: Feet supported Sitting balance-Leahy Scale: Fair Sitting balance - Comments: seated EOB, will state "I'm going to pass out" when anxious                                    Cognition Arousal/Alertness: Awake/alert Behavior During Therapy: Anxious Overall Cognitive Status: No family/caregiver present to determine baseline cognitive functioning                                 General Comments: answering questions appropriately, but confused as to why she cannot eat/drink, current medical condition.  Becomes very anxious at times during session requiring cues to breathe and calm down.      Exercises      General Comments        Pertinent Vitals/Pain Faces Pain Scale: Hurts whole lot Pain Location: L LE Pain Descriptors / Indicators: Discomfort;Grimacing;Guarding;Moaning Pain Intervention(s): Monitored during session;Limited activity within patient's tolerance;Repositioned;Relaxation    Home Living                      Prior Function  PT Goals (current goals can now be found in the care plan section) Acute Rehab PT Goals Patient Stated Goal: wants food, drinks PT Goal Formulation: With patient Time For Goal Achievement: 08/12/21 Potential to Achieve Goals: Fair Progress towards PT goals: Progressing toward goals    Frequency    Min 2X/week      PT Plan Current plan remains appropriate    Co-evaluation PT/OT/SLP Co-Evaluation/Treatment: Yes Reason for Co-Treatment: To address functional/ADL transfers;For patient/therapist safety;Necessary to address  cognition/behavior during functional activity PT goals addressed during session: Mobility/safety with mobility;Balance        AM-PAC PT "6 Clicks" Mobility   Outcome Measure  Help needed turning from your back to your side while in a flat bed without using bedrails?: A Lot Help needed moving from lying on your back to sitting on the side of a flat bed without using bedrails?: A Lot Help needed moving to and from a bed to a chair (including a wheelchair)?: Total Help needed standing up from a chair using your arms (e.g., wheelchair or bedside chair)?: A Lot Help needed to walk in hospital room?: Total Help needed climbing 3-5 steps with a railing? : Total 6 Click Score: 9    End of Session   Activity Tolerance: Patient limited by pain;Other (comment);Patient limited by fatigue (anxiety) Patient left: in bed;with call bell/phone within reach;with bed alarm set Nurse Communication: Mobility status PT Visit Diagnosis: Other abnormalities of gait and mobility (R26.89);Muscle weakness (generalized) (M62.81)     Time: [id number] PT Time Calculation (min) (ACUTE ONLY): 42 min  Charges:  $Therapeutic Activity: 8-22 mins                    Anvi Mangal, PT, GCS 08/01/21,4:06 PM

## 2021-08-01 NOTE — TOC Progression Note (Signed)
Transition of Care [facility]) - Progression Note    Patient Details  Name: Lauren [name] MRN: [id number] Date of Birth: 1946-01-22  [facility] CM/SW Contact  Joanne Chars, LCSW Phone Number: 08/01/2021, 2:21 PM  Clinical Narrative: CSW spoke with pt about DC plan.  CSW confirmed that Debbie from [place] had brought her pocketbook this AM and pt confirmed that she now had it.  Discussed recommendation for SNF.  Pt stating she does not want to go to SNF, wants to go home.  Again discussed need for safe DC, discussed that pt did not walk during her last PT session.  Pt states that she has tried to work with PT and "they won't let me do anything."  Pt then told CSW that "you didn't follow through on what you were supposed to do either."    Per PT note, pt declined to participate earlier today.        Expected Discharge Plan: [place] Barriers to Discharge: Continued Medical Work up, SNF Pending bed offer  Expected Discharge Plan and Services Expected Discharge Plan: [place] arrangements for the past 2 months: Single Family Home                                       Social Determinants of Health (SDOH) Interventions    Readmission Risk Interventions Readmission Risk Prevention Plan 07/12/2021  Transportation Screening Complete  Home Care Screening Complete  Medication Review (RN CM) Complete  Some recent data might be hidden

## 2021-08-01 NOTE — Progress Notes (Signed)
Nutrition Follow-up  DOCUMENTATION CODES:  Not applicable  INTERVENTION:  Continue TPN management per Pharmacy  NUTRITION DIAGNOSIS:  Inadequate oral intake related to altered GI function, acute illness as evidenced by NPO status. -- ongoing  GOAL:  Patient will meet greater than or equal to 90% of their needs -- met with TPN  MONITOR:  Labs, Weight trends, Skin, I & O's, Other (Comment) (TPN)  REASON FOR ASSESSMENT:  Consult Assessment of nutrition requirement/status, New TPN/TNA  ASSESSMENT:  75 year old female who presented to the ED on 8/24 s/p CPR. Pt with recent admission from 8/09 to 8/23 with anemia from GI bleed secondary to cecal mass s/p R hemicolectomy with primary anastomosis on 07/06/21. 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 Pt also found to be positive for COVID-19 on admission. PMH of atrial fibrillation, CHF, HTN, lymphedema, mitral regurgitation. Pt admitted with septic shock with abdominal wall fluid collection and concern for necrotizing fasciitis.  Pt is in good spirits and reports abdominal pain is slowly improving over time. Pt expresses excitement regarding future diet advancement and later discharge. Per RN, pt's last BM was Friday and Eakins pouch output has not been well documented.  At this time, pt remains on TPN. Per Pharmacy, TPN to continue at goal of 143m/hr (provides 2373 kcals and 151g protein)  Admission weight: 114.9 kg Current weight (last updated 9/1): 123.7 kg  UOP: 1.2L x24 hours I/O: +18L since admit  Labs and medications reviewed. Pt receiving protonix.   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  Estimated Nutritional Needs:  Kcal:  [address]Protein:  140-160 grams Fluid:  >/= 2.0 L    ALarkin Ina MS, RD, LDN (she/her/hers) RD pager number and weekend/on-call pager number located in [facility]

## 2021-08-01 NOTE — Progress Notes (Addendum)
   75 year old with AFIB  Continue with IV amiodarone for AFIB BP soft No changes.  Tele HR 90's Irreg irreg  Discussed with Dr. Alfredia Ferguson.   Candee Furbish, MD

## 2021-08-01 NOTE — Progress Notes (Signed)
Pt presents very anxious and agitated this morning, crying about having to be in there hospital and not being able eat, reassurance and encouragement given. Administered prn haldol

## 2021-08-01 NOTE — Progress Notes (Signed)
PT Cancellation Note  Patient Details Name: ANGGIE BRANDIN MRN: [id number] DOB: August 20, 1946   Cancelled Treatment:    Reason Eval/Treat Not Completed: Patient declined, no reason specified. Patient refusing all attempts at any therapy this session. Assisted to reposition pillows. Will re-attempt later as time allows.    Lizet Kelso 08/01/2021, 10:40 AM

## 2021-08-01 NOTE — Progress Notes (Signed)
PROGRESS NOTE    Lauren [name]  [name] DOB: 08-27-1946 DOA: 07/18/2021 PCP: Pcp, No   Brief Narrative:  The patient is a 75 year old morbidly obese Caucasian female with a past medical history significant for but not limited to atrial fibrillation, combined chronic systolic and diastolic CHF, hypertension, lymphedema as well as other comorbidities with recent admission to hospital for GI bleeding secondary to cecal mass status post right hemicolectomy on 07/06/2021 at outside hospital.  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  The patient was at [facility] and she is noted to be hypotensive complaint of nausea abdominal pain and back pain.  She is found to be COVID-positive.  She brought to the hospital and received IV fluids, vasopressors and antibiotics.  CT scan of the abdomen and pelvis showed fistula extending the bowel adjacent to the suture to an umbilical hernia into the abdominal wall fluid collection in the anterior abdominal wall and gas tracking.  Patient was admitted to the ICU on 07/18/2021 and started on vasopressors and antibiotics.  On 07/19/2001 patient was started on TPN taken off of Levophed.  She is subsequently transferred out of ICU and considered stable.  Hospitalization has been complicated by atrial fibrillation with RVR, new CHF as well as bleeding to left thigh resulting in acute blood loss anemia requiring blood transfusion.  Cardiology been consulted and general surgery is following.  Currently she is n.p.o. 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  T3 N2 M0 status.  -CT scan showing some fistula with abdominal wall collection.   -Initially thought to be abscess but when the midline wound was opened stool was noted to be present. General surgery continues to follow.  Patient was placed on bowel rest and TPN was initiated.  Remains on Zosyn. -CT scan of the abdomen done 9/4 does not show any new fluid collection except for the left-sided thigh hematoma.  Extensive subcutaneous emphysema was noted as before and thought to be less compared to previous films.  -Remains afebrile.  WBC has been elevated though appears to be improving.   -Patient remained on IV Zosyn but after discussion with general surgery antibiotics were stopped -Remains n.p.o. currently and TPN being continued.  Dilaudid for pain control.  Electrolytes being addressed by pharmacy.   Colon cancer/cecal mass -Right hemicolectomy on 07/06/21 (done at [facility]) for cecal mass with adenocarcinoma by pathology.  T3 N2 M0 status.  -Patient was seen by medical oncology (Dr. Chryl Heck) who recommended getting a CT chest to rule out pulmonary mets.   -She feels that if the CT chest did not show any evidence for metastatic process her prognosis could be reasonably good. -CT of the chest done and showed "Moderate left, small right pleural effusions and associated atelectasis or consolidation. Scattered ground-glass opacities throughout the lungs, nonspecific and infectious or inflammatory. Prominent pretracheal lymph nodes,  likely reactive although nonspecific. Metastatic disease not favored in the absence of other evidence." -We will need to discuss further with medical oncology   Acute Blood Loss Anemia  -  Drop in hemoglobin was due to hematoma in the left thigh.   -Unclear as to the reason behind the hematoma.   -No falls or injuries were reported.  Patient was aggressively transfused PRBCs.   -Has received 4 units so far.    -Hemoglobin responded.  Seems to be stable over the last 24 hours.  Anticoagulation remains on hold. Patient refused afternoon blood draw  yesterdayto check Hgb despite being told the importance of making sure the hemoglobin has not decreased.  -Repeat CBC showed stable hemoglobin/hematocrit of 8.2/27.1 -To monitor for signs and symptoms of bleeding; currently no overt bleeding noted   Left thigh hematoma/pain in the left hip area -Noted to have hematoma in the left upper thigh muscles as well as possibly in the left psoas muscle.   -Pain medications as needed.  Will hold anticoagulation.   -Discussed with orthopedics (Dr. Ninfa Linden).  No role for surgical intervention currently.   -Continue Holding anticoagulation.    Permanent Atrial Fibrillation -Patient was on Xarelto as outpatient.   -Was switched over to Lovenox here in the hospital.   -Heart rate remain poorly controlled despite being on metoprolol initially and subsequently on Cardizem infusion.   -Subsequently cardiology was consulted due to poorly controlled heart rate and low blood pressures.   -Patient was subsequently started on amiodarone.   -Was given digoxin as well.  Heart rate is reasonably well controlled on IV Amiodarone and Cardiology recommends no change -Lovenox continues to be on hold due to acute bleed.   -If hemoglobin remains stable could consider resuming anticoagulation in 48 hrs however would start with heparin infusion likely   Acute on Chronic Systolic CHF -Care everywhere was reviewed.   -She had an echocardiogram in August 2022 at [facility] which showed EF to be 30%. -Continue to monitor volume status.   -CT scan of the abdomen does raise concern for anasarca. -Cardiology is following.  No invasive work-up is planned as of now due to her tenuous status. -Some concern for hypervolemia but will try some lasix and give a dose of IV Lasix 40 mg x1 -Strict I's and O's and Daily weights; She is +18.355 Liters since  Admission -Weight is elevated and gone from 253 -> 272    Septic shock  -Patient seems to be stable from a sepsis standpoint.  WBC is improved and trending down and went from 15.4 -> 14.2 -> 14.5 -> 12.2 -> 12.1 -Has been off of vasopressors.  -Remained on IV Zosyn but now discontinued on 07/31/21.  Cultures have been negative.     Acute kidney injury -Resolved. -BUN/creatinine is now 25/0.52 -Avoid further nephrotoxic medications, contrast dyes, hypotension renally dose medications -Repeat CMP in a.m.   Acute delirium -Appears to have improved.   -Continue Haldol as needed.    -Placed on delirium precautions   Incidental COVID 19 positive. -Completed remdesivir.   -On room air.  Patient tested positive on 8/24.  Now off of isolation.  Hypothyroidism -C/w Levothyroxine 50 mcg IV Daily    Hyponatremia/hypokalemia/hypomagnesemia -Sodium is stable.  Potassium, magnesium and phosphorus being addressed by pharmacy.    Goals of care -Palliative care met with the patient and spoke to patient's brother who is the next of kin.   -Patient does have a daughter but they are not in touch with each other.   -Patient was changed over to DNR/DNI based on this conversation.  GERD/GI Prophylaxis -C/w Pantoprazole 40 mg q12h   Morbid Obesity -Complicates overall prognosis and  care -Estimated body mass index is 49.88 kg/m as calculated from the following:   Height as of this encounter: _0  (1.575 m).   Weight as of this encounter: 123.7 kg. -Weight Loss and Dietary Counseling given  -Nutritionist recommending continue TPN per pharmacy management  DVT prophylaxis: SCDs Code Status: DO NOT RESUSCITATE  Family Communication: No family present at bedside  Disposition Plan: None  Status is: Inpatient  Remains inpatient appropriate because:Unsafe d/c plan, IV treatments appropriate due to intensity of illness or inability to take PO, and Inpatient level of care appropriate due to severity  of illness  Dispo: The patient is from: Home              Anticipated d/c is to: SNF              Patient currently is not medically stable to d/c.   Difficult to place patient No  Consultants:  PCCM Transfer Cardiology  General Surgery Palliative Care Medicine   Procedures: PICC Line   Antimicrobials:  Anti-infectives (From admission, onward)    Start     Dose/Rate Route Frequency Ordered Stop   07/20/21 1000  remdesivir 100 mg in sodium chloride 0.9 % 100 mL IVPB       See Hyperspace for full Linked Orders Report.   100 mg 200 mL/hr over 30 Minutes Intravenous Daily 07/19/21 1040 07/21/21 1148   07/20/21 0200  vancomycin (VANCOREADY) IVPB 750 mg/150 mL  Status:  Discontinued        750 mg 150 mL/hr over 60 Minutes Intravenous Every 24 hours 07/19/21 1036 07/19/21 1449   07/19/21 2300  anidulafungin (ERAXIS) 100 mg in sodium chloride 0.9 % 100 mL IVPB  Status:  Discontinued        100 mg 78 mL/hr over 100 Minutes Intravenous Every 24 hours 07/18/21 2107 07/19/21 1449   07/19/21 1600  vancomycin (VANCOREADY) IVPB 750 mg/150 mL  Status:  Discontinued        750 mg 150 mL/hr over 60 Minutes Intravenous Every 24 hours 07/19/21 0919 07/19/21 1016   07/19/21 1600  vancomycin (VANCOREADY) IVPB 750 mg/150 mL  Status:  Discontinued        750 mg 150 mL/hr over 60 Minutes Intravenous Every 24 hours 07/19/21 1016 07/19/21 1036   07/19/21 1400  piperacillin-tazobactam (ZOSYN) IVPB 3.375 g  Status:  Discontinued        3.375 g 12.5 mL/hr over 240 Minutes Intravenous Every 8 hours 07/19/21 1016 07/31/21 1501   07/19/21 1130  remdesivir 200 mg in sodium chloride 0.9% 250 mL IVPB       See Hyperspace for full Linked Orders Report.   200 mg 580 mL/hr over 30 Minutes Intravenous Once 07/19/21 1040 07/19/21 1241   07/19/21 0100  metroNIDAZOLE (FLAGYL) IVPB 500 mg  Status:  Discontinued        500 mg 100 mL/hr over 60 Minutes Intravenous Every 12 hours 07/18/21 1203 07/18/21 1534   07/18/21  2200  ceFEPIme (MAXIPIME) 2 g in sodium chloride 0.9 % 100 mL IVPB  Status:  Discontinued        2 g 200 mL/hr over 30 Minutes Intravenous Every 12 hours 07/18/21 1156 07/18/21 1203   07/18/21 2200  meropenem (MERREM) 2 g in sodium chloride 0.9 % 100 mL IVPB  Status:  Discontinued        2 g 200 mL/hr over 30 Minutes Intravenous Every 12 hours 07/18/21 2104 07/19/21 1011   07/18/21 2200  anidulafungin (ERAXIS) 200 mg in sodium chloride 0.9 % 200 mL IVPB        200 mg 78 mL/hr over 200 Minutes Intravenous  Once 07/18/21 2108 07/19/21 0419   07/18/21 1800  clindamycin (CLEOCIN) IVPB 900 mg  Status:  Discontinued        900 mg 100 mL/hr over 30 Minutes Intravenous Every 8 hours 07/18/21 1534 07/18/21 2103   07/18/21 1600  vancomycin (VANCOREADY) IVPB 2000 mg/400 mL  Status:  Discontinued        2,000 mg 200 mL/hr over 120 Minutes Intravenous Every 24 hours 07/18/21 1544 07/19/21 0919   07/18/21 1545  piperacillin-tazobactam (ZOSYN) IVPB 3.375 g  Status:  Discontinued        3.375 g 12.5 mL/hr over 240 Minutes Intravenous Every 8 hours 07/18/21 1544 07/18/21 2104   07/18/21 1300  cefTRIAXone (ROCEPHIN) 2 g in sodium chloride 0.9 % 100 mL IVPB  Status:  Discontinued        2 g 200 mL/hr over 30 Minutes Intravenous Every 24 hours 07/18/21 1203 07/18/21 1628   07/18/21 1200  ceFEPIme (MAXIPIME) 2 g in sodium chloride 0.9 % 100 mL IVPB  Status:  Discontinued        2 g 200 mL/hr over 30 Minutes Intravenous Every 12 hours 07/18/21 1155 07/18/21 1156   07/18/21 0930  ceFEPIme (MAXIPIME) 2 g in sodium chloride 0.9 % 100 mL IVPB        2 g 200 mL/hr over 30 Minutes Intravenous  Once 07/18/21 0918 07/18/21 1135   07/18/21 0930  metroNIDAZOLE (FLAGYL) IVPB 500 mg  Status:  Discontinued        500 mg 100 mL/hr over 60 Minutes Intravenous  Once 07/18/21 1610 07/18/21 1534        Subjective: Seen and examined at bedside and she was very frustrated and wanting something to eat and drink.  No chest  pain or shortness breath.  Denied any lightheadedness or dizziness.  Feels okay but very thirsty.  No other concerns or plans at this time.  Objective: Vitals:   07/31/21 2100 08/01/21 0022 08/01/21 0415 08/01/21 1154  BP: 110/67 112/67 (!) 103/51 126/82  Pulse: 92 100 98   Resp: _0 Temp: 98.1 F (36.7 C)  97.9 F (36.6 C) 98.1 F (36.7 C)  TempSrc: Oral  Oral Oral  SpO2: 97% 96% 97%   Weight:      Height:        Intake/Output Summary (Last 24 hours) at 08/01/2021 1627 Last data filed at 08/01/2021 1014 Gross per 24 hour  Intake 3216.7 ml  Output 1350 ml  Net 1866.7 ml   Filed Weights   07/25/21 0307 07/26/21 0340 07/26/21 1333  Weight: 124.3 kg 126.8 kg 123.7 kg   Examination: Physical Exam:  Constitutional: WN/WD morbidly obese Caucasian female with very anxious and agitated and upset and frustrated  Eyes: Lids and conjunctivae normal, sclerae anicteric  ENMT: External Ears, Nose appear normal. Grossly normal hearing.  Neck: Appears normal, supple, no cervical masses, normal ROM, no appreciable thyromegaly; slight appreciable JVD Respiratory: Diminished to auscultation bilaterally with coarse breath sounds and some mild crackles, no wheezing, rales, rhonchi. Normal respiratory effort and patient is not tachypenic. No accessory muscle use.  Unlabored breathing Cardiovascular: Irregularly irregular, no murmurs / rubs / gallops. S1 and S2 auscultated.  Has nonpitting lower extremity edema. Abdomen: Soft, non-tender, distended secondary body habitus and has surgical changes with a King's pouch.  Bowel sounds positive.  GU: Deferred. Musculoskeletal: No clubbing / cyanosis of digits/nails. No joint deformity upper and lower extremities. Skin: No rashes, lesions, ulcers on limited skin evaluation and has surgical changes in her abdomen. No induration; Warm and dry.  Neurologic: CN 2-12 grossly intact with no focal deficits. Romberg sign and cerebellar reflexes not assessed.   Psychiatric: Normal judgment and insight. Alert and oriented x 3.  Anxious and frustrated mood  Data Reviewed: I have personally reviewed following labs and imaging studies  CBC: Recent Labs  Lab 07/29/21 0500 07/29/21 1404 07/30/21 0218 07/30/21 1411 07/31/21 0101 08/01/21 0156  WBC 15.4*  --  14.2* 14.5* 12.2* 12.1*  HGB 6.8* 7.0* 7.4* 8.4* 7.9* 8.2*  HCT 22.1* 22.2* 23.7* 26.3* 25.9* 27.1*  MCV 83.1  --  85.9 84.8 87.2 89.1  PLT 164  --  138* 148* 143* 290*   Basic Metabolic Panel: Recent Labs  Lab 07/26/21 0615 07/27/21 0951 07/28/21 0813 07/29/21 0500 07/30/21 0218 07/31/21 0101 08/01/21 0156  NA 137  --  140 140 141 142 140  K 4.1  --  3.7 3.7 3.7 4.3 4.6  CL 108  --  111 112* 110 115* 112*  CO2 24  --  _0 GLUCOSE 94  --  117* 115* 105* 95 94  BUN 18  --  _1 25*  CREATININE 0.56  --  0.59 0.47 0.53 0.50 0.52  CALCIUM 8.0*  --  7.7* 7.6* 7.9* 7.8* 7.9*  MG 1.6* 1.9  --  2.1 2.1  --   --   PHOS 3.3  --   --   --  3.2  --   --    GFR: Estimated Creatinine Clearance: 79.8 mL/min (by C-G formula based on SCr of 0.52 mg/dL). Liver Function Tests: Recent Labs  Lab 07/26/21 0615 07/28/21 0813 07/30/21 0218  AST 34 33 23  ALT _2 ALKPHOS 111 89 92  BILITOT 0.5 0.5 0.9  PROT 5.8* 4.9* 4.8*  ALBUMIN 1.5* 1.1* 1.1*   No results for input(s): LIPASE, AMYLASE in the last 168 hours. No results for input(s): AMMONIA in the last 168 hours. Coagulation Profile: No results for input(s): INR, PROTIME in the last 168 hours. Cardiac Enzymes: No results for input(s): CKTOTAL, CKMB, CKMBINDEX, TROPONINI in the last 168 hours. BNP (last 3 results) No results for input(s): PROBNP in the last 8760 hours. HbA1C: No results for input(s): HGBA1C in the last 72 hours. CBG: No results for input(s): GLUCAP in the last 168 hours. Lipid Profile: Recent Labs    07/30/21 0218  TRIG 69   Thyroid Function Tests: No results for input(s): TSH,  T4TOTAL, FREET4, T3FREE, THYROIDAB in the last 72 hours. Anemia Panel: No results for input(s): VITAMINB12, FOLATE, FERRITIN, TIBC, IRON, RETICCTPCT in the last 72 hours. Sepsis Labs: Recent Labs  Lab 07/27/21 0951 07/28/21 0813  PROCALCITON 0.29 0.26    No results found for this or any previous visit (from the past 240 hour(s)).   RN Pressure Injury Documentation: Pressure Injury 07/18/21 Coccyx (Active)  07/18/21 2100  Location: Coccyx  Location Orientation:   Staging:   Wound Description (Comments):   Present on Admission:      Pressure Injury 07/18/21 Thigh Posterior;Proximal;Right (Active)  07/18/21 2100  Location: Thigh  Location Orientation: Posterior;Proximal;Right  Staging:   Wound Description (Comments):   Present on Admission:      Estimated body mass index is 49.88  kg/m as calculated from the following:   Height as of this encounter: _0  (1.575 m).   Weight as of this encounter: 123.7 kg.  Malnutrition Type:  Nutrition Problem: Inadequate oral intake Etiology: altered GI function, acute illness  Malnutrition Characteristics:  Signs/Symptoms: NPO status  Nutrition Interventions:  Interventions: TPN   Radiology Studies: CT CHEST W CONTRAST  Result Date: 07/31/2021 CLINICAL DATA:  Colon cancer staging EXAM: CT CHEST WITH CONTRAST TECHNIQUE: Multidetector CT imaging of the chest was performed during intravenous contrast administration. CONTRAST:  8m OMNIPAQUE IOHEXOL 350 MG/ML SOLN COMPARISON:  CT abdomen pelvis, 07/29/2021 FINDINGS: Cardiovascular: Right upper extremity PICC, tip positioned in the superior vena cava. Normal heart size. No pericardial effusion. Mediastinum/Nodes: Prominent pretracheal lymph nodes measuring up to 2.0 x 1.3 cm (series 3, image 54). Thyroid gland, trachea, and esophagus demonstrate no significant findings. Lungs/Pleura: Moderate left, small right pleural effusions and associated atelectasis or consolidation. Scattered  ground-glass opacities throughout the lungs (e.g. Series 4, image 59). Upper Abdomen: Subcutaneous emphysema about the abdominal wall, in keeping with recent hemicolectomy. Musculoskeletal: No chest wall mass or suspicious bone lesions identified. IMPRESSION: 1. Moderate left, small right pleural effusions and associated atelectasis or consolidation. 2. Scattered ground-glass opacities throughout the lungs, nonspecific and infectious or inflammatory. 3. Prominent pretracheal lymph nodes, likely reactive although nonspecific. Metastatic disease not favored in the absence of other evidence. Electronically Signed   By: AEddie CandleM.D.   On: 07/31/2021 12:57    Scheduled Meds:  Chlorhexidine Gluconate Cloth  6 each Topical Daily   levothyroxine  50 mcg Intravenous Daily   mouth rinse  15 mL Mouth Rinse BID   pantoprazole (PROTONIX) IV  40 mg Intravenous Q12H   sodium chloride flush  10-40 mL Intracatheter Q12H   Continuous Infusions:  amiodarone 30 mg/hr (08/01/21 1528)   methocarbamol (ROBAXIN) IV 500 mg (07/25/21 2343)   TPN ADULT (ION) 105 mL/hr at 08/01/21 0300   TPN ADULT (ION)      LOS: 14 days   OKerney Elbe DO [facility] PAGER [place] 7PM-7AM, please contact night-coverage [email or web address]

## 2021-08-01 NOTE — Progress Notes (Signed)
Occupational Therapy Treatment Patient Details Name: CANNA SWADLEY MRN: [id number] DOB: 08-Apr-1946 Today's Date: 08/01/2021    History of present illness 75 yo female with c/o constipation and rectal pain. 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 Pt s/p R hemicolectomy 07/06/21. PMH: a fib, morbid obesity, diastolic CHF, hypertension, lymphedema   OT comments  Patient in bed and complained of not getting out bed.  Patient was a ci-treat with PT.  Patient was mod assist +2 to get to EOB due to assistance needed with BLE.  Patient stood from EOB and bed was soiled.  Patient stood 1-2 to assist with hygiene and changing bed but patient was returned to bed to complete due to fatigue.  Acute OT to continue to follow.   Follow Up Recommendations  SNF    Equipment Recommendations  Wheelchair (measurements OT);Wheelchair cushion (measurements OT)    Recommendations for Other Services      Precautions / Restrictions Precautions Precautions: Fall Precaution Comments: nasuea/vomiting/ painful L hip Restrictions Weight Bearing Restrictions: No       Mobility Bed Mobility Overal bed mobility: Needs Assistance Bed Mobility: Supine to Sit;Sit to Supine;Rolling Rolling: Max assist;+2 for physical assistance   Supine to sit: +2 for physical assistance;HOB elevated;Mod assist Sit to supine: Max assist;+2 for physical assistance   General bed mobility comments: Patient makes good effort to assist. Has very painful L LE.    Transfers Overall transfer level: Needs assistance Equipment used: 1 person hand held assist;None Transfers: Sit to/from Stand Sit to Stand: Mod assist;From elevated surface         General transfer comment: able to stand quite well but can get panicky    Balance Overall balance assessment: Needs assistance Sitting-balance support: Feet supported Sitting balance-Leahy Scale: Fair Sitting balance - Comments: seated EOB,  will state "I'm going to pass out" when anxious                                   ADL either performed or assessed with clinical judgement   ADL Overall ADL's : Needs assistance/impaired             Lower Body Bathing: Total assistance;Bed level Lower Body Bathing Details (indicate cue type and reason): Patient required total assists for bathing and assistance for rolling in bed Upper Body Dressing : Moderate assistance;Bed level Upper Body Dressing Details (indicate cue type and reason): Donned gown Lower Body Dressing: Total assistance;Bed level Lower Body Dressing Details (indicate cue type and reason): donn footwear               General ADL Comments: Patient had soiled bed.  Attempted to assist patient with cleaning while standing  at eob but had to return to supine to complete.     Vision       Perception     Praxis      Cognition Arousal/Alertness: Awake/alert Behavior During Therapy: Anxious Overall Cognitive Status: No family/caregiver present to determine baseline cognitive functioning                                 General Comments: Did not know why she could not eat or drink, thought she was able to walk        Exercises     Shoulder Instructions       General Comments  Pertinent Vitals/ Pain       Pain Assessment: Faces Pain Score: 7  Faces Pain Scale: Hurts even more Pain Location: L LE Pain Descriptors / Indicators: Discomfort;Grimacing;Guarding;Moaning Pain Intervention(s): Monitored during session;Repositioned  Home Living                                          Prior Functioning/Environment              Frequency  Min 1X/week        Progress Toward Goals  OT Goals(current goals can now be found in the care plan section)  Progress towards OT goals: Progressing toward goals  Acute Rehab OT Goals Patient Stated Goal: I want something to drink OT Goal Formulation:  With patient Time For Goal Achievement: 08/11/21 Potential to Achieve Goals: Good ADL Goals Pt Will Perform Grooming: with min guard assist;sitting Additional ADL Goal #1: Max A of one for bed mobility to reduce caregiver burden. Additional ADL Goal #2: Patient will sit edge of bed for up to 5 min to increase independence with grooming and bed mobility.  Plan Discharge plan remains appropriate    Co-evaluation    PT/OT/SLP Co-Evaluation/Treatment: Yes Reason for Co-Treatment: Complexity of the patient's impairments (multi-system involvement);For patient/therapist safety PT goals addressed during session: Mobility/safety with mobility;Balance OT goals addressed during session: ADL's and self-care      AM-PAC OT "6 Clicks" Daily Activity     Outcome Measure   Help from another person eating meals?: Total Help from another person taking care of personal grooming?: A Little Help from another person toileting, which includes using toliet, bedpan, or urinal?: Total Help from another person bathing (including washing, rinsing, drying)?: A Lot Help from another person to put on and taking off regular upper body clothing?: A Lot Help from another person to put on and taking off regular lower body clothing?: Total 6 Click Score: 10    End of Session    OT Visit Diagnosis: Other abnormalities of gait and mobility (R26.89);Muscle weakness (generalized) (M62.81);Other symptoms and signs involving cognitive function;Pain;Adult, failure to thrive (R62.7) Pain - Right/Left: Left Pain - part of body: Hip   Activity Tolerance Patient limited by pain   Patient Left in bed;with call bell/phone within reach;with bed alarm set;with nursing/sitter in room   Nurse Communication Other (comment) (informed on BM and amount)        Time: [id number] OT Time Calculation (min): 42 min  Charges: OT General Charges $OT Visit: 1 Visit OT Treatments $Self Care/Home Management : 23-37 mins  Lodema Hong, [place] 08/01/2021, 4:12 PM

## 2021-08-01 NOTE — Progress Notes (Signed)
PHARMACY - TOTAL PARENTERAL NUTRITION CONSULT NOTE  Indication: Fistula  Patient Measurements: Height: '5\' 2"'$  (157.5 cm) Weight: 123.7 kg (272 lb 11.3 oz) IBW/kg (Calculated) : 50.1 TPN AdjBW (KG): 66.6 Body mass index is 49.88 kg/m.  Assessment:  32 YOF with recent R-hemicolectomy on 8/12 in the setting of a cecal mass - consistent with R-colon metastatic adenocarcinoma. 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 Pharmacy consulted to manage TPN for nutritional support in the setting of a colocutaneous fistula.     Glucose / Insulin: CBGs <120, off SSI 07/22/21 Electrolytes: all WNL, K 4.6 (goal >/=4 for Afib), Mag 2.1 (goal >/= 2 for Afib), CL mild high Renal:  SCr < 1, BUN 25 Hepatic: LFTs / tibili / TG WNL, albumin 1.1 Intake / Output; MIVF:  UOP 1200 mL + 1x unmeasured, stool O/P 148m   GI Imaging: 8/24 Abd CT: colocutaneous fistula, 7.4cm abd wall abscess, soft tissue edema, cellulitis vs nec fasciitis 8/25 Abd xray: diffuse abd wall emphysema, nonobstructive bowel gas pattern 9/4 CT abd/pelvis: medial upper left thigh hematoma GI Surgeries / Procedures:  8/12 (last admit): R-hemicolectomy  Central access: PICC replaced 07/27/21 TPN start date: 07/19/21  Nutritional Goals (RD assessment on 8/25): 2300-2500 kcal, 140-160g AA, fluid >/= 2L per day  Current Nutrition:  TPN  Plan:  Continue TPN at goal rate of 105 mL/hr, providing 151g AA and 2373 kCal meeting 100% of estimated needs Electrolytes in TPN: Na 125 mEq/L, Decrease K to 30 mEq/L, Ca 470m/L, Mg 1126mL, Phos 53m50mL, Cl:Ac 1:1 Add standard MVI and trace elements to TPN Standard TPN labs Mon/Thurs  Thank you for involving pharmacy in this patient's care.  BenjAlbertina ParrarmD., BCPS, BCCCP Clinical Pharmacist Please [facility]

## 2021-08-01 NOTE — Progress Notes (Signed)
OT Cancellation Note  Patient Details Name: Lauren [name] MRN: [id number] DOB: 1945-12-14   Cancelled Treatment:    Reason Eval/Treat Not Completed: Pain limiting ability to participate.  Patient stated she was tired from lack of sleep and had pain issue.  Will attempt again later.  [place], OTA  08/01/2021, 11:07 AM

## 2021-08-02 DIAGNOSIS — C189 Malignant neoplasm of colon, unspecified: Secondary | ICD-10-CM | POA: Diagnosis not present

## 2021-08-02 DIAGNOSIS — L03311 Cellulitis of abdominal wall: Secondary | ICD-10-CM | POA: Diagnosis not present

## 2021-08-02 DIAGNOSIS — A419 Sepsis, unspecified organism: Secondary | ICD-10-CM | POA: Diagnosis not present

## 2021-08-02 DIAGNOSIS — N179 Acute kidney failure, unspecified: Secondary | ICD-10-CM | POA: Diagnosis not present

## 2021-08-02 LAB — COMPREHENSIVE METABOLIC PANEL
ALT: 23 U/L (ref 0–44)
AST: 35 U/L (ref 15–41)
Albumin: 1.2 g/dL — ABNORMAL LOW (ref 3.5–5.0)
Alkaline Phosphatase: 229 U/L — ABNORMAL HIGH (ref 38–126)
Anion gap: 3 — ABNORMAL LOW (ref 5–15)
BUN: 25 mg/dL — ABNORMAL HIGH (ref 8–23)
CO2: 28 mmol/L (ref 22–32)
Calcium: 7.7 mg/dL — ABNORMAL LOW (ref 8.9–10.3)
Chloride: 110 mmol/L (ref 98–111)
Creatinine, Ser: 0.48 mg/dL (ref 0.44–1.00)
GFR, Estimated: >60 mL/min (ref >60)
Glucose, Bld: 93 mg/dL (ref 70–99)
Potassium: 4 mmol/L (ref 3.5–5.1)
Sodium: 141 mmol/L (ref 135–145)
Total Bilirubin: 0.9 mg/dL (ref 0.3–1.2)
Total Protein: 5.5 g/dL — ABNORMAL LOW (ref 6.5–8.1)

## 2021-08-02 LAB — CBC WITH DIFFERENTIAL/PLATELET
Abs Immature Granulocytes: 0.1 10*3/uL — ABNORMAL HIGH (ref 0.00–0.07)
Basophils Absolute: 0.1 10*3/uL (ref 0.0–0.1)
Basophils Relative: 1 %
Eosinophils Absolute: 0.5 10*3/uL (ref 0.0–0.5)
Eosinophils Relative: 6 %
HCT: 26.2 % — ABNORMAL LOW (ref 36.0–46.0)
Hemoglobin: 7.7 g/dL — ABNORMAL LOW (ref 12.0–15.0)
Immature Granulocytes: 1 %
Lymphocytes Relative: 17 %
Lymphs Abs: 1.6 10*3/uL (ref 0.7–4.0)
MCH: 27 pg (ref 26.0–34.0)
MCHC: 29.4 g/dL — ABNORMAL LOW (ref 30.0–36.0)
MCV: 91.9 fL (ref 80.0–100.0)
Monocytes Absolute: 0.8 10*3/uL (ref 0.1–1.0)
Monocytes Relative: 8 %
Neutro Abs: 6.6 10*3/uL (ref 1.7–7.7)
Neutrophils Relative %: 67 %
Platelets: 155 10*3/uL (ref 150–400)
RBC: 2.85 MIL/uL — ABNORMAL LOW (ref 3.87–5.11)
RDW: 26.5 % — ABNORMAL HIGH (ref 11.5–15.5)
WBC: 9.7 10*3/uL (ref 4.0–10.5)
nRBC: 0.2 % (ref 0.0–0.2)

## 2021-08-02 LAB — MAGNESIUM: Magnesium: 2 mg/dL (ref 1.7–2.4)

## 2021-08-02 LAB — PHOSPHORUS: Phosphorus: 3.4 mg/dL (ref 2.5–4.6)

## 2021-08-02 MED ORDER — FUROSEMIDE 10 MG/ML IJ SOLN
40.0000 mg | Freq: Once | INTRAMUSCULAR | Status: AC
  Administered 2021-08-02: 40 mg via INTRAVENOUS
  Filled 2021-08-02: qty 4

## 2021-08-02 MED ORDER — TRAVASOL 10 % IV SOLN
INTRAVENOUS | Status: AC
  Filled 2021-08-02: qty 1512

## 2021-08-02 NOTE — Progress Notes (Signed)
PROGRESS NOTE    Lauren [name]  OZD:[id number] DOB: Jul 13, 1946 DOA: 07/18/2021 PCP: Pcp, No   Brief Narrative:  The patient is a 75 year old morbidly obese Caucasian female with a past medical history significant for but not limited to atrial fibrillation, combined chronic systolic and diastolic CHF, hypertension, lymphedema as well as other comorbidities with recent admission to hospital for GI bleeding secondary to cecal mass status post right hemicolectomy on 07/06/2021 at outside hospital.  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  The patient was at [facility] facility and she is noted to be hypotensive complaint of nausea abdominal pain and back pain.  She is found to be COVID-positive.  She brought to the hospital and received IV fluids, vasopressors and antibiotics.  CT scan of the abdomen and pelvis showed fistula extending the bowel adjacent to the suture to an umbilical hernia into the abdominal wall fluid collection in the anterior abdominal wall and gas tracking.  Patient was admitted to the ICU on 07/18/2021 and started on vasopressors and antibiotics.  On 07/19/2001 patient was started on TPN taken off of Levophed.  She is subsequently transferred out of ICU and considered stable.  Hospitalization has been complicated by atrial fibrillation with RVR, new CHF as well as bleeding to left thigh resulting in acute blood loss anemia requiring blood transfusion.  Cardiology been consulted and general surgery is following.  Currently she is n.p.o. and continues to get TPN to allow healing.  General surgery will check on the patient tomorrow and have requested accurate output from her fistula.   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  T3 N2 M0 status.  -CT scan showing some fistula with abdominal wall collection.   -Initially thought to be abscess but when the midline wound was opened stool was noted to be present. General surgery continues to follow.  Patient was placed on bowel rest and TPN was initiated.   -CT scan of the abdomen done 9/4 does not show any new fluid collection except for the left-sided thigh hematoma.  Extensive subcutaneous emphysema was noted as before and thought to be less compared to previous films.  -Remains afebrile.  WBC has been elevated though appears to be improving.   -Patient remained on IV Zosyn but after discussion with general surgery antibiotics were stopped -Remains n.p.o. currently and TPN being continued.  Dilaudid for pain control.  Electrolytes being addressed by pharmacy. -Continue n.p.o. and per my discussion with general surgery today they will see the patient tomorrow   Colon cancer/cecal mass -Right hemicolectomy on 07/06/21 (done at [facility]) for cecal mass with adenocarcinoma by pathology.  T3 N2 M0 status.  -Patient was seen by medical oncology (Dr. Chryl Heck) who recommended getting a CT chest to rule out pulmonary mets.   -She feels that if the CT chest did not show any evidence for metastatic process her prognosis could be reasonably good. -CT of the chest done and showed "Moderate left, small right pleural effusions and associated atelectasis or consolidation. Scattered ground-glass opacities throughout the lungs, nonspecific and infectious or inflammatory. Prominent  pretracheal lymph nodes,  likely reactive although nonspecific. Metastatic disease not favored in the absence of other evidence." -We will need to discuss further with medical oncology   Acute Blood Loss Anemia  -Drop in hemoglobin was  due to hematoma in the left thigh.   -Unclear as to the reason behind the hematoma.   -No falls or injuries were reported.  Patient was aggressively transfused PRBCs.   -Has received 4 units so far.   -Hemoglobin responded.  Seems to be stable over the last 24 hours.  Anticoagulation remains on hold. Patient refused afternoon blood draw  yesterdayto check Hgb despite being told the importance of making sure the hemoglobin has not decreased.  -Repeat CBC showed stable hemoglobin/hematocrit of 8.2/27.1 yesterday and slightly dropped to 7.7/26.2 -Continue to monitor for signs and symptoms of bleeding; currently no overt bleeding noted -Hemoglobin is stable will resume anticoagulation but with a heparin drip   Left thigh hematoma/pain in the left hip area -Noted to have hematoma in the left upper thigh muscles as well as possibly in the left psoas muscle.   -Pain medications as needed.  Will hold anticoagulation.   -Discussed with orthopedics (Dr. Ninfa Linden).  No role for surgical intervention currently.   -Continue Holding anticoagulation for now and will consider resuming in a.m.    Permanent Atrial Fibrillation -Patient was on Xarelto as outpatient.   -Was switched over to Lovenox here in the hospital but now held   -Heart rate remained poorly controlled despite being on metoprolol initially and subsequently on Cardizem infusion.   -Subsequently cardiology was consulted due to poorly controlled heart rate and low blood pressures.   -Patient was subsequently started on amiodarone.   -Was given digoxin as well.  Heart rate is reasonably well controlled on IV Amiodarone now and Cardiology recommends no change today given that HR has been stable in the 90's-100's on Tele -Lovenox continues to be on hold due to acute bleed.   -If hemoglobin remains stable could consider resuming anticoagulation in 48 hrs however would start with heparin infusion likely in the morning   Acute on Chronic Systolic  CHF -Care everywhere was reviewed.   -She had an echocardiogram in August 2022 at [facility] which showed EF to be 30%. -Continue to monitor volume status.   -CT scan of the abdomen does raise concern for anasarca. -Cardiology is following.  No invasive work-up is planned as of now due to her tenuous status. -Some concern for hypervolemia but will try some lasix and she was given a dose of IV Lasix 40 mg x1 yesterday and another 1 today -Strict I's and O's and Daily weights; She was +18.355 Liters since Admission and is now +15.764 L -Weight is elevated and gone from 253 -> 272    Septic shock  -Patient seems to be stable from a sepsis standpoint.  WBC is improved and trending down and went from 15.4 -> 14.2 -> 14.5 -> 12.2 -> 12.1 and has now normalized to 9.7 -Has been off of vasopressors.  -Remained on IV Zosyn but now discontinued on 07/31/21.  Cultures have been negative.     Acute kidney injury -Resolved. -BUN/creatinine is now 25/0.48 -Avoid further nephrotoxic medications, contrast dyes, hypotension renally dose medications -Repeat CMP in a.m.   Acute delirium -Appears to have improved.   -Continue Haldol as needed.    -Placed on delirium precautions   Incidental COVID 19 positive. -Completed remdesivir.   -On room air.  Patient tested positive on  8/24.  Now off of isolation.  Hypothyroidism -C/w Levothyroxine 50 mcg IV Daily    Hyponatremia/hypokalemia/hypomagnesemia -Sodium is stable.  Potassium, magnesium and phosphorus being addressed by pharmacy.    Goals of care -Palliative care met with the patient and spoke to patient's brother who is the next of kin.   -Patient does have a daughter but they are not in touch with each other.   -Patient was changed over to DNR/DNI based on this conversation.  GERD/GI Prophylaxis -C/w Pantoprazole 40 mg q12h   Morbid Obesity -Complicates overall prognosis and care -Estimated body mass index is 49.88 kg/m as calculated from the  following:   Height as of this encounter: 5' 2"  (1.575 m).   Weight as of this encounter: 123.7 kg. -Weight Loss and Dietary Counseling given  -Nutritionist recommending continue TPN per pharmacy management  DVT prophylaxis: SCDs Code Status: DO NOT RESUSCITATE  Family Communication: No family present at bedside  Disposition Plan: None  Status is: Inpatient  Remains inpatient appropriate because:Unsafe d/c plan, IV treatments appropriate due to intensity of illness or inability to take PO, and Inpatient level of care appropriate due to severity of illness  Dispo: The patient is from: Home              Anticipated d/c is to: SNF              Patient currently is not medically stable to d/c.   Difficult to place patient No  Consultants:  PCCM Transfer Cardiology  General Surgery Palliative Care Medicine   Procedures: PICC Line   Antimicrobials:  Anti-infectives (From admission, onward)    Start     Dose/Rate Route Frequency Ordered Stop   07/20/21 1000  remdesivir 100 mg in sodium chloride 0.9 % 100 mL IVPB       See Hyperspace for full Linked Orders Report.   100 mg 200 mL/hr over 30 Minutes Intravenous Daily 07/19/21 1040 07/21/21 1148   07/20/21 0200  vancomycin (VANCOREADY) IVPB 750 mg/150 mL  Status:  Discontinued        750 mg 150 mL/hr over 60 Minutes Intravenous Every 24 hours 07/19/21 1036 07/19/21 1449   07/19/21 2300  anidulafungin (ERAXIS) 100 mg in sodium chloride 0.9 % 100 mL IVPB  Status:  Discontinued        100 mg 78 mL/hr over 100 Minutes Intravenous Every 24 hours 07/18/21 2107 07/19/21 1449   07/19/21 1600  vancomycin (VANCOREADY) IVPB 750 mg/150 mL  Status:  Discontinued        750 mg 150 mL/hr over 60 Minutes Intravenous Every 24 hours 07/19/21 0919 07/19/21 1016   07/19/21 1600  vancomycin (VANCOREADY) IVPB 750 mg/150 mL  Status:  Discontinued        750 mg 150 mL/hr over 60 Minutes Intravenous Every 24 hours 07/19/21 1016 07/19/21 1036   07/19/21  1400  piperacillin-tazobactam (ZOSYN) IVPB 3.375 g  Status:  Discontinued        3.375 g 12.5 mL/hr over 240 Minutes Intravenous Every 8 hours 07/19/21 1016 07/31/21 1501   07/19/21 1130  remdesivir 200 mg in sodium chloride 0.9% 250 mL IVPB       See Hyperspace for full Linked Orders Report.   200 mg 580 mL/hr over 30 Minutes Intravenous Once 07/19/21 1040 07/19/21 1241   07/19/21 0100  metroNIDAZOLE (FLAGYL) IVPB 500 mg  Status:  Discontinued        500 mg 100 mL/hr over 60 Minutes Intravenous Every  12 hours 07/18/21 1203 07/18/21 1534   07/18/21 2200  ceFEPIme (MAXIPIME) 2 g in sodium chloride 0.9 % 100 mL IVPB  Status:  Discontinued        2 g 200 mL/hr over 30 Minutes Intravenous Every 12 hours 07/18/21 1156 07/18/21 1203   07/18/21 2200  meropenem (MERREM) 2 g in sodium chloride 0.9 % 100 mL IVPB  Status:  Discontinued        2 g 200 mL/hr over 30 Minutes Intravenous Every 12 hours 07/18/21 2104 07/19/21 1011   07/18/21 2200  anidulafungin (ERAXIS) 200 mg in sodium chloride 0.9 % 200 mL IVPB        200 mg 78 mL/hr over 200 Minutes Intravenous  Once 07/18/21 2108 07/19/21 0419   07/18/21 1800  clindamycin (CLEOCIN) IVPB 900 mg  Status:  Discontinued        900 mg 100 mL/hr over 30 Minutes Intravenous Every 8 hours 07/18/21 1534 07/18/21 2103   07/18/21 1600  vancomycin (VANCOREADY) IVPB 2000 mg/400 mL  Status:  Discontinued        2,000 mg 200 mL/hr over 120 Minutes Intravenous Every 24 hours 07/18/21 1544 07/19/21 0919   07/18/21 1545  piperacillin-tazobactam (ZOSYN) IVPB 3.375 g  Status:  Discontinued        3.375 g 12.5 mL/hr over 240 Minutes Intravenous Every 8 hours 07/18/21 1544 07/18/21 2104   07/18/21 1300  cefTRIAXone (ROCEPHIN) 2 g in sodium chloride 0.9 % 100 mL IVPB  Status:  Discontinued        2 g 200 mL/hr over 30 Minutes Intravenous Every 24 hours 07/18/21 1203 07/18/21 1628   07/18/21 1200  ceFEPIme (MAXIPIME) 2 g in sodium chloride 0.9 % 100 mL IVPB  Status:   Discontinued        2 g 200 mL/hr over 30 Minutes Intravenous Every 12 hours 07/18/21 1155 07/18/21 1156   07/18/21 0930  ceFEPIme (MAXIPIME) 2 g in sodium chloride 0.9 % 100 mL IVPB        2 g 200 mL/hr over 30 Minutes Intravenous  Once 07/18/21 0918 07/18/21 1135   07/18/21 0930  metroNIDAZOLE (FLAGYL) IVPB 500 mg  Status:  Discontinued        500 mg 100 mL/hr over 60 Minutes Intravenous  Once 07/18/21 8676 07/18/21 1534        Subjective: Seen and examined at bedside and was doing a little bit better today but was wanting something to eat.  No chest pain or shortness of breath.  In any lightheadedness or dizziness.  Had some leg tenderness and states that she has been up to the chair 5 times yesterday.  No other concerns or complaints at this time..  Objective: Vitals:   08/02/21 0400 08/02/21 0800 08/02/21 1259 08/02/21 1600  BP:  135/67 (!) 134/54 125/83  Pulse:  88 61 99  Resp:  15 19 17   Temp: (!) 97 F (36.1 C) 97.7 F (36.5 C) (!) 96.4 F (35.8 C) (!) 96.5 F (35.8 C)  TempSrc: Axillary Oral Axillary Oral  SpO2:  96% 90% 96%  Weight:      Height:        Intake/Output Summary (Last 24 hours) at 08/02/2021 1716 Last data filed at 08/02/2021 0453 Gross per 24 hour  Intake 2753.13 ml  Output 1900 ml  Net 853.13 ml    Filed Weights   07/25/21 0307 07/26/21 0340 07/26/21 1333  Weight: 124.3 kg 126.8 kg 123.7 kg  Examination: Physical Exam:  Constitutional: WN/WD morbidly obese Caucasian female who is not as anxious and a little more calm today Eyes: Lids and conjunctivae normal, sclerae anicteric  ENMT: External Ears, Nose appear normal. Grossly normal hearing.  Neck: Appears normal, supple, no cervical masses, normal ROM, no appreciable thyromegaly; has mild JVD noted but her JVD is difficult to assess given her body habitus Respiratory: Diminished to auscultation bilaterally, no wheezing, rales, rhonchi or crackles. Normal respiratory effort and patient is not  tachypenic. No accessory muscle use.  Cardiovascular: Irregularly irregular, no murmurs / rubs / gallops. S1 and S2 auscultated.  Has some lower extremity nonpitting edema/lymphedema and elephantiasis Abdomen: Soft, non-tender, distended secondary body habitus and has surgical changes with an Eakin's pouch with some feculent material. Bowel sounds positive.  GU: Deferred. Musculoskeletal: No clubbing / cyanosis of digits/nails. No joint deformity upper and lower extremities.  Skin: No rashes, lesions, ulcers on limited skin evaluation but does have surgical changes abdomen. No induration; Warm and dry.  Neurologic: CN 2-12 grossly intact with no focal deficits. Romberg sign and cerebellar reflexes not assessed.  Psychiatric: Normal judgment and insight. Alert and oriented x 3.  Calmer mood  Data Reviewed: I have personally reviewed following labs and imaging studies  CBC: Recent Labs  Lab 07/30/21 0218 07/30/21 1411 07/31/21 0101 08/01/21 0156 08/02/21 0938  WBC 14.2* 14.5* 12.2* 12.1* 9.7  NEUTROABS  --   --   --   --  6.6  HGB 7.4* 8.4* 7.9* 8.2* 7.7*  HCT 23.7* 26.3* 25.9* 27.1* 26.2*  MCV 85.9 84.8 87.2 89.1 91.9  PLT 138* 148* 143* 147* 751    Basic Metabolic Panel: Recent Labs  Lab 07/27/21 0951 07/28/21 0813 07/29/21 0500 07/30/21 0218 07/31/21 0101 08/01/21 0156 08/02/21 0938  NA  --    < > 140 141 142 140 141  K  --    < > 3.7 3.7 4.3 4.6 4.0  CL  --    < > 112* 110 115* 112* 110  CO2  --    < > 28 25 24 23 28   GLUCOSE  --    < > 115* 105* 95 94 93  BUN  --    < > 17 20 22  25* 25*  CREATININE  --    < > 0.47 0.53 0.50 0.52 0.48  CALCIUM  --    < > 7.6* 7.9* 7.8* 7.9* 7.7*  MG 1.9  --  2.1 2.1  --   --  2.0  PHOS  --   --   --  3.2  --   --  3.4   < > = values in this interval not displayed.    GFR: Estimated Creatinine Clearance: 79.8 mL/min (by C-G formula based on SCr of 0.48 mg/dL). Liver Function Tests: Recent Labs  Lab 07/28/21 0813 07/30/21 0218  08/02/21 0938  AST 33 23 35  ALT 18 15 23   ALKPHOS 89 92 229*  BILITOT 0.5 0.9 0.9  PROT 4.9* 4.8* 5.5*  ALBUMIN 1.1* 1.1* 1.2*    No results for input(s): LIPASE, AMYLASE in the last 168 hours. No results for input(s): AMMONIA in the last 168 hours. Coagulation Profile: No results for input(s): INR, PROTIME in the last 168 hours. Cardiac Enzymes: No results for input(s): CKTOTAL, CKMB, CKMBINDEX, TROPONINI in the last 168 hours. BNP (last 3 results) No results for input(s): PROBNP in the last 8760 hours. HbA1C: No results for input(s): HGBA1C in the last  72 hours. CBG: No results for input(s): GLUCAP in the last 168 hours. Lipid Profile: No results for input(s): CHOL, HDL, LDLCALC, TRIG, CHOLHDL, LDLDIRECT in the last 72 hours.  Thyroid Function Tests: No results for input(s): TSH, T4TOTAL, FREET4, T3FREE, THYROIDAB in the last 72 hours. Anemia Panel: No results for input(s): VITAMINB12, FOLATE, FERRITIN, TIBC, IRON, RETICCTPCT in the last 72 hours. Sepsis Labs: Recent Labs  Lab 07/27/21 0951 07/28/21 0813  PROCALCITON 0.29 0.26     No results found for this or any previous visit (from the past 240 hour(s)).   RN Pressure Injury Documentation: Pressure Injury 07/18/21 Coccyx (Active)  07/18/21 2100  Location: Coccyx  Location Orientation:   Staging:   Wound Description (Comments):   Present on Admission:      Pressure Injury 07/18/21 Thigh Posterior;Proximal;Right (Active)  07/18/21 2100  Location: Thigh  Location Orientation: Posterior;Proximal;Right  Staging:   Wound Description (Comments):   Present on Admission:      Estimated body mass index is 49.88 kg/m as calculated from the following:   Height as of this encounter: 5' 2"  (1.575 m).   Weight as of this encounter: 123.7 kg.  Malnutrition Type:  Nutrition Problem: Inadequate oral intake Etiology: altered GI function, acute illness  Malnutrition Characteristics:  Signs/Symptoms: NPO  status  Nutrition Interventions:  Interventions: TPN   Radiology Studies: No results found.  Scheduled Meds:  Chlorhexidine Gluconate Cloth  6 each Topical Daily   levothyroxine  50 mcg Intravenous Daily   mouth rinse  15 mL Mouth Rinse BID   pantoprazole (PROTONIX) IV  40 mg Intravenous Q12H   sodium chloride flush  10-40 mL Intracatheter Q12H   Continuous Infusions:  amiodarone 30 mg/hr (08/02/21 0453)   methocarbamol (ROBAXIN) IV 500 mg (07/25/21 2343)   TPN ADULT (ION) 105 mL/hr at 08/02/21 0453   TPN ADULT (ION)      LOS: 15 days   Kerney Elbe, DO [facility] PAGER is on AMION  If 7PM-7AM, please contact night-coverage [email or web address]

## 2021-08-02 NOTE — Progress Notes (Signed)
IV Team consult placed for therapeutic phlebotomy.  Primary RN states needs a DBIV.  Labs were collected at [phone].  RN states consult can be cleared.

## 2021-08-02 NOTE — Progress Notes (Signed)
Pt refused morning labs, pt educated on reasons for the need, however pt still refused

## 2021-08-02 NOTE — Progress Notes (Addendum)
Patient remains in afib. HR stable at 90-100s on tele.   Continue IV amiodarone. Resume anticoagulation, try IV heparin first, when safe.  BP is improving.   No new recommendations today.   Agree with above  Please let us know if we can be of further assistance.  Candee Furbish, MD

## 2021-08-02 NOTE — Progress Notes (Signed)
PHARMACY - TOTAL PARENTERAL NUTRITION CONSULT NOTE  Indication: Fistula  Patient Measurements: Height: '5\' 2"'$  (157.5 cm) Weight: 123.7 kg (272 lb 11.3 oz) IBW/kg (Calculated) : 50.1 TPN AdjBW (KG): 66.6 Body mass index is 49.88 kg/m.  Assessment:  37 YOF with recent R-hemicolectomy on 8/12 in the setting of a cecal mass - consistent with R-colon metastatic adenocarcinoma. 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 Pharmacy consulted to manage TPN for nutritional support in the setting of a colocutaneous fistula.     Glucose / Insulin: CBGs <120, off SSI 07/22/21 Electrolytes: all WNL, K 4 (goal >/=4 for Afib), Mag 2 (goal >/= 2 for Afib), CL mild high Renal:  SCr < 1, BUN 25 Hepatic: LFTs / tibili / TG WNL, albumin 1.2 Intake / Output; MIVF:  UOP 1900 mL + 2x unmeasured, stool O/P not charted  GI Imaging: 8/24 Abd CT: colocutaneous fistula, 7.4cm abd wall abscess, soft tissue edema, cellulitis vs nec fasciitis 8/25 Abd xray: diffuse abd wall emphysema, nonobstructive bowel gas pattern 9/4 CT abd/pelvis: medial upper left thigh hematoma GI Surgeries / Procedures:  8/12 (last admit): R-hemicolectomy  Central access: PICC replaced 07/27/21 TPN start date: 07/19/21  Nutritional Goals (RD assessment on 8/25): 2300-2500 kcal, 140-160g AA, fluid >/= 2L per day  Current Nutrition:  TPN  Plan:  Continue TPN at goal rate of 105 mL/hr, providing 151g AA and 2373 kCal meeting 100% of estimated needs Electrolytes in TPN: Na 125 mEq/L, Increase K to 40 mEq/L, Ca 98mq/L, Mg 174m/L, Phos 2247m/L, Cl:Ac 1:1 Add standard MVI and trace elements to TPN Standard TPN labs Mon/Thurs  Thank you for involving pharmacy in this patient's care.  BenAlbertina ParrharmD., BCPS, BCCCP Clinical Pharmacist Please refer to [facility]-specific pharmacist

## 2021-08-03 DIAGNOSIS — N179 Acute kidney failure, unspecified: Secondary | ICD-10-CM | POA: Diagnosis not present

## 2021-08-03 DIAGNOSIS — C189 Malignant neoplasm of colon, unspecified: Secondary | ICD-10-CM | POA: Diagnosis not present

## 2021-08-03 DIAGNOSIS — A419 Sepsis, unspecified organism: Secondary | ICD-10-CM | POA: Diagnosis not present

## 2021-08-03 DIAGNOSIS — L03311 Cellulitis of abdominal wall: Secondary | ICD-10-CM | POA: Diagnosis not present

## 2021-08-03 LAB — COMPREHENSIVE METABOLIC PANEL
ALT: 26 U/L (ref 0–44)
AST: 41 U/L (ref 15–41)
Albumin: 1.4 g/dL — ABNORMAL LOW (ref 3.5–5.0)
Alkaline Phosphatase: 238 U/L — ABNORMAL HIGH (ref 38–126)
Anion gap: 3 — ABNORMAL LOW (ref 5–15)
BUN: 25 mg/dL — ABNORMAL HIGH (ref 8–23)
CO2: 26 mmol/L (ref 22–32)
Calcium: 7.8 mg/dL — ABNORMAL LOW (ref 8.9–10.3)
Chloride: 109 mmol/L (ref 98–111)
Creatinine, Ser: 0.47 mg/dL (ref 0.44–1.00)
GFR, Estimated: >60 mL/min (ref >60)
Glucose, Bld: 110 mg/dL — ABNORMAL HIGH (ref 70–99)
Potassium: 4.1 mmol/L (ref 3.5–5.1)
Sodium: 138 mmol/L (ref 135–145)
Total Bilirubin: 1 mg/dL (ref 0.3–1.2)
Total Protein: 6.1 g/dL — ABNORMAL LOW (ref 6.5–8.1)

## 2021-08-03 LAB — CBC WITH DIFFERENTIAL/PLATELET
Abs Immature Granulocytes: 0.13 10*3/uL — ABNORMAL HIGH (ref 0.00–0.07)
Basophils Absolute: 0.1 10*3/uL (ref 0.0–0.1)
Basophils Relative: 1 %
Eosinophils Absolute: 0.5 10*3/uL (ref 0.0–0.5)
Eosinophils Relative: 5 %
HCT: 28.2 % — ABNORMAL LOW (ref 36.0–46.0)
Hemoglobin: 8.4 g/dL — ABNORMAL LOW (ref 12.0–15.0)
Immature Granulocytes: 1 %
Lymphocytes Relative: 14 %
Lymphs Abs: 1.3 10*3/uL (ref 0.7–4.0)
MCH: 26.9 pg (ref 26.0–34.0)
MCHC: 29.8 g/dL — ABNORMAL LOW (ref 30.0–36.0)
MCV: 90.4 fL (ref 80.0–100.0)
Monocytes Absolute: 0.7 10*3/uL (ref 0.1–1.0)
Monocytes Relative: 7 %
Neutro Abs: 6.7 10*3/uL (ref 1.7–7.7)
Neutrophils Relative %: 72 %
Platelets: 187 10*3/uL (ref 150–400)
RBC: 3.12 MIL/uL — ABNORMAL LOW (ref 3.87–5.11)
RDW: 26 % — ABNORMAL HIGH (ref 11.5–15.5)
Smear Review: NORMAL
WBC: 9.4 10*3/uL (ref 4.0–10.5)
nRBC: 0.2 % (ref 0.0–0.2)

## 2021-08-03 LAB — PHOSPHORUS: Phosphorus: 3.5 mg/dL (ref 2.5–4.6)

## 2021-08-03 LAB — MAGNESIUM: Magnesium: 1.9 mg/dL (ref 1.7–2.4)

## 2021-08-03 MED ORDER — TRAVASOL 10 % IV SOLN
INTRAVENOUS | Status: AC
  Filled 2021-08-03: qty 1512

## 2021-08-03 MED ORDER — HEPARIN (PORCINE) 25000 UT/250ML-% IV SOLN
2000.0000 [IU]/h | INTRAVENOUS | Status: DC
  Administered 2021-08-03: 1200 [IU]/h via INTRAVENOUS
  Administered 2021-08-05: 1600 [IU]/h via INTRAVENOUS
  Filled 2021-08-03 (×3): qty 250

## 2021-08-03 MED ORDER — ALTEPLASE 2 MG IJ SOLR
2.0000 mg | Freq: Once | INTRAMUSCULAR | Status: AC
  Administered 2021-08-03: 2 mg
  Filled 2021-08-03: qty 2

## 2021-08-03 MED ORDER — FUROSEMIDE 10 MG/ML IJ SOLN
40.0000 mg | Freq: Once | INTRAMUSCULAR | Status: AC
  Administered 2021-08-03: 40 mg via INTRAVENOUS
  Filled 2021-08-03: qty 4

## 2021-08-03 NOTE — Progress Notes (Signed)
ANTICOAGULATION CONSULT NOTE - Initial Consult  Pharmacy Consult for Heparin Indication: atrial fibrillation  Allergies  Allergen Reactions   Codeine Itching   Compazine [Prochlorperazine Edisylate]    Prednisone Nausea And Vomiting   Tetanus Toxoids Other (See Comments)    Knot like "hen egg" came up    Vicodin [Hydrocodone-Acetaminophen] Itching    Patient Measurements: Height: '5\' 2"'$  (157.5 cm) Weight: 123.7 kg (272 lb 11.3 oz) IBW/kg (Calculated) : 50.1 Heparin Dosing Weight: 80.8 kg  Vital Signs: Temp: 97.8 F (36.6 C) (09/09 0736) Temp Source: Oral (09/09 0736) BP: 137/76 (09/09 1100) Pulse Rate: 109 (09/09 0847)  Labs: Recent Labs    08/01/21 0156 08/02/21 0938 08/03/21 1011  HGB 8.2* 7.7* 8.4*  HCT 27.1* 26.2* 28.2*  PLT 147* 155 187  CREATININE 0.52 0.48 0.47    Estimated Creatinine Clearance: 79.8 mL/min (by C-G formula based on SCr of 0.47 mg/dL).   Medical History: Past Medical History:  Diagnosis Date   Acute cholecystitis 12/06/2012   Chronic combined systolic and diastolic CHF (congestive heart failure) (HCC)    Colon cancer (HCC)    Diverticulosis    Hypotension    BP runs soft   Hypothyroidism    Lymphedema    Morbid obesity ([place])    Obesity    Pancreatitis, acute 0000000   Periumbilical hernia    Permanent atrial fibrillation [facility])     Assessment: The patient is a 75 year old morbidly obese with GI bleeding secondary to a cecal mass status post right hemicolectomy on 07/06/2021 at outside hospital.  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 Was on Xarelto PTA for atrial fibrillation. Anticoagulation has been on hold since 9/3 due to acute drop in Hgb.  Pharmacy consulted to start Heparin.  Patient has not had Xarelto during this admission (16 days).  Goal of Therapy:  Heparin level 0.3-0.7 units/ml Monitor platelets by anticoagulation protocol: Yes   Plan:  Start heparin infusion at 1200  units/hr Check anti-Xa level in 8 hours and daily while on heparin Continue to monitor H&H and platelets  Alanda Slim, PharmD, [facility] Clinical Pharmacist Please see AMION for all Pharmacists' Contact Phone Numbers 08/03/2021, 2:58 PM

## 2021-08-03 NOTE — Plan of Care (Signed)
  Problem: Respiratory: Goal: Ability to maintain adequate ventilation will improve Outcome: Progressing   Problem: Education: Goal: Knowledge of General Education information will improve Description: Including pain rating scale, medication(s)/side effects and non-pharmacologic comfort measures Outcome: Progressing   Problem: Health Behavior/Discharge Planning: Goal: Ability to manage health-related needs will improve Outcome: Progressing   Problem: Activity: Goal: Risk for activity intolerance will decrease Outcome: Progressing   Problem: Nutrition: Goal: Adequate nutrition will be maintained Outcome: Progressing

## 2021-08-03 NOTE — Progress Notes (Signed)
PHARMACY - TOTAL PARENTERAL NUTRITION CONSULT NOTE  Indication: Fistula  Patient Measurements: Height: '5\' 2"'$  (157.5 cm) Weight: 123.7 kg (272 lb 11.3 oz) IBW/kg (Calculated) : 50.1 TPN AdjBW (KG): 66.6 Body mass index is 49.88 kg/m.  Assessment:  82 YOF with recent R-hemicolectomy on 8/12 in the setting of a cecal mass - consistent with R-colon metastatic adenocarcinoma. 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 Pharmacy consulted to manage TPN for nutritional support in the setting of a colocutaneous fistula.     Glucose / Insulin: CBGs <120, off SSI 07/22/21 Electrolytes: all WNL, K 4.1 (goal >/=4 for Afib), Mag 1.9 (goal >/= 2 for Afib), CL wnl Renal:  SCr < 1, BUN 25 Hepatic: LFTs / tibili / TG WNL, albumin 1.4 Intake / Output; MIVF:  UOP 1x unmeasured, stool O/P not charted, drains 150 ml GI Imaging: 8/24 Abd CT: colocutaneous fistula, 7.4cm abd wall abscess, soft tissue edema, cellulitis vs nec fasciitis 8/25 Abd xray: diffuse abd wall emphysema, nonobstructive bowel gas pattern 9/4 CT abd/pelvis: medial upper left thigh hematoma GI Surgeries / Procedures:  8/12 (last admit): R-hemicolectomy  Central access: PICC replaced 07/27/21 TPN start date: 07/19/21  Nutritional Goals (RD assessment on 8/25): 2300-2500 kcal, 140-160g AA, fluid >/= 2L per day  Current Nutrition:  TPN  Plan:  Continue TPN at goal rate of 105 mL/hr, providing 151g AA and 2373 kCal meeting 100% of estimated needs Electrolytes in TPN: Na 125 mEq/L, K 40 mEq/L, Ca 38mq/L, Mg 112m/L, Phos 228m/L, Cl:Ac 1:1 Add standard MVI and trace elements to TPN Standard TPN labs Mon/Thurs Per surgery, no diet advancement until determination of fistula output   Thank you for involving pharmacy in this patient's care.  BenAlbertina ParrharmD., BCPS, BCCCP Clinical Pharmacist Please refer to [facility]-specific pharmacist

## 2021-08-03 NOTE — Progress Notes (Signed)
Physical Therapy Treatment Patient Details Name: Lauren [name] MRN: [id number] DOB: [birth year] Today's Date: 08/03/2021    History of Present Illness 75 yo female admitted [id number] from SNF [facility]) with hypotension and Covid (+). Pt with colocutaneous fistula, Afib and new CHf since admission. Pt also with abdominal wall cellulitis and left thigh hematoma requiring blood transfusion. Pt with recent admission (8/9-8/23) to [facility] with partially obstructing tumor in the ascending colon s/p R hemicolectomy 07/06/21. PMH: A fib, morbid obesity, hypertension, lymphedema, hypothyroidism    PT Comments    Pt pleasant on arrival and willing to participate with pt voicing pain with LLE movement. Pt able to stand and pivot to chair but declined remaining in chair stating fatigue and discomfort despite education for benefit and need to progress mobility. Pt declined further standing trials or HEP. Pt limited by anxiety and pain. D/C plan remains appropriate.      Follow Up Recommendations  SNF;Supervision/Assistance - 24 hour     Equipment Recommendations  None recommended by PT    Recommendations for Other Services       Precautions / Restrictions Precautions Precautions: Fall Precaution Comments: painful LLE, bil LE edema, LUE cellulitis, ostomy to suction    Mobility  Bed Mobility Overal bed mobility: Needs Assistance Bed Mobility: Supine to Sit;Sit to Supine     Supine to sit: HOB elevated;Mod assist;+2 for physical assistance Sit to supine: Max assist;+2 for physical assistance   General bed mobility comments: HOB 40 degrees with assist to pivot toward right to exit bed with Bil UE support and HOB elevated with multimodal cues. Return to bed with max assist to lift and control bil LE to surface with assist to guide trunk    Transfers Overall transfer level: Needs assistance   Transfers: Sit to/from Stand Sit to Stand: Min assist;+2 safety/equipment         General transfer  comment: min bil UE support with bil UE hooking to stand from bed x 2 and from recliner x 2. Pt able to take short steps to pivot from bed<>chair with bil UE support and increased time. Pt refused remaining in chair and returned to bed with assist  Ambulation/Gait                 Stairs             Wheelchair Mobility    Modified Rankin (Stroke Patients Only)       Balance Overall balance assessment: Needs assistance   Sitting balance-Leahy Scale: Fair Sitting balance - Comments: able to sit EOB and at edge of chair without UE support   Standing balance support: Bilateral upper extremity supported Standing balance-Leahy Scale: Poor Standing balance comment: bil UE support in standing                            Cognition Arousal/Alertness: Awake/alert Behavior During Therapy: Anxious Overall Cognitive Status: No family/caregiver present to determine baseline cognitive functioning                                 General Comments: pt very anxious with mobility, easily overwhelmed with questions or requests for movement, decreased problem solving and awareness      Exercises      General Comments        Pertinent Vitals/Pain Pain Score: 6  Pain Location: L LE Pain Descriptors /  Indicators: Discomfort;Grimacing;Guarding;Moaning Pain Intervention(s): Limited activity within patient's tolerance;Monitored during session;Repositioned    Home Living                      Prior Function            PT Goals (current goals can now be found in the care plan section) Progress towards PT goals: Progressing toward goals    Frequency    Min 2X/week      PT Plan Current plan remains appropriate    Co-evaluation              AM-PAC PT "6 Clicks" Mobility   Outcome Measure  Help needed turning from your back to your side while in a flat bed without using bedrails?: A Lot Help needed moving from lying on your back  to sitting on the side of a flat bed without using bedrails?: Total Help needed moving to and from a bed to a chair (including a wheelchair)?: A Lot Help needed standing up from a chair using your arms (e.g., wheelchair or bedside chair)?: A Lot Help needed to walk in hospital room?: Total Help needed climbing 3-5 steps with a railing? : Total 6 Click Score: 9    End of Session   Activity Tolerance: Other (comment);Patient limited by fatigue (limited by anxiety and fatigue) Patient left: in bed;with call bell/phone within reach;with bed alarm set Nurse Communication: Mobility status PT Visit Diagnosis: Other abnormalities of gait and mobility (R26.89);Muscle weakness (generalized) (M62.81)     Time: [id number] PT Time Calculation (min) (ACUTE ONLY): 41 min  Charges:  $Therapeutic Activity: 38-52 mins                     Fuad Forget P, PT Acute Rehabilitation Services Pager: [phone] Office: [place] Latasia Silberstein 08/03/2021, 11:37 AM

## 2021-08-03 NOTE — Progress Notes (Signed)
PROGRESS NOTE    Lauren [name]  NTZ:[id number] DOB: [birth year] DOA: 07/18/2021 PCP: Pcp, No   Brief Narrative:  The patient is a 75 year old morbidly obese Caucasian female with a past medical history significant for but not limited to atrial fibrillation, combined chronic systolic and diastolic CHF, hypertension, lymphedema as well as other comorbidities with recent admission to hospital for GI bleeding secondary to cecal mass status post right hemicolectomy on 07/06/2021 at outside hospital.  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  The patient was at [facility] facility and she is noted to be hypotensive complaint of nausea abdominal pain and back pain.  She is found to be COVID-positive.  She brought to the hospital and received IV fluids, vasopressors and antibiotics.  CT scan of the abdomen and pelvis showed fistula extending the bowel adjacent to the suture to an umbilical hernia into the abdominal wall fluid collection in the anterior abdominal wall and gas tracking.  Patient was admitted to the ICU on 07/18/2021 and started on vasopressors and antibiotics.  On 07/19/2001 patient was started on TPN taken off of Levophed.  She is subsequently transferred out of ICU and considered stable.  Hospitalization has been complicated by atrial fibrillation with RVR, new CHF as well as bleeding to left thigh resulting in acute blood loss anemia requiring blood transfusion.  Cardiology been consulted and general surgery is following.  Currently she is n.p.o. and continues to get TPN to allow healing.  General surgery a check on the patient today and have requested accurate output from her fistula which has still not being done properly.  We will need accurate stool output to determine if this is a low or high output fistula to further determine diet advancement.  Per general surgery the patient is okay for sips with meds and ice chips.   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  T3 N2 M0 status.  -CT scan showing some fistula with abdominal wall collection.   -Initially thought to be abscess but when the midline wound was opened stool was noted to be present. General surgery continues to follow.  Patient was placed on bowel rest and TPN was initiated.   -CT scan of the abdomen done 9/4 does not show any new fluid collection except for the left-sided thigh hematoma.  Extensive subcutaneous emphysema was noted as before and thought to be less compared to previous films.  -Remains afebrile.  WBC has been elevated though appears to be improving.   -Patient remained on IV Zosyn but after discussion with general surgery antibiotics were stopped -Remains n.p.o. currently and TPN being continued.  Dilaudid for pain control.  Electrolytes being addressed by pharmacy. -General surgery has advance the patient to sips with meds and chips but we will not recommend further diet advancement until we can determine if this is a high or low output fistula   Colon cancer/cecal mass -Right hemicolectomy on 07/06/21 (done at [facility]) for cecal mass with adenocarcinoma by pathology.  T3 N2 M0 status.  -Patient was seen by medical oncology (Dr. Chryl Heck) who recommended  getting a CT chest to rule out pulmonary mets.   -She feels that if the CT chest did not show any evidence for metastatic process her prognosis could be reasonably good. -CT of the chest done and showed "Moderate left, small right pleural effusions and associated atelectasis or consolidation. Scattered ground-glass opacities  throughout the lungs, nonspecific and infectious or inflammatory. Prominent pretracheal lymph nodes,  likely reactive although nonspecific. Metastatic disease not favored in the absence of other evidence." -We will need to discuss further with medical oncology   Acute Blood Loss Anemia  -Drop in hemoglobin was due to hematoma in the left thigh.   -Unclear as to the reason behind the hematoma.   -No falls or injuries were reported.  Patient was aggressively transfused PRBCs.   -Has received 4 units so far.   -Hemoglobin responded.  Seems to be stable over the last 24 hours.  Anticoagulation remains on hold. Patient refused afternoon blood draw  yesterdayto check Hgb despite being told the importance of making sure the hemoglobin has not decreased.  -Repeat CBC showed stable hemoglobin/hematocrit of 8.2/27.1 -> 7.7/26.2 -> 8.4/28.2 -Continue to monitor for signs and symptoms of bleeding; currently no overt bleeding noted -Since her hemoglobin is stable we will resume anticoagulation with a heparin drip   Left thigh hematoma/pain in the left hip area -Noted to have hematoma in the left upper thigh muscles as well as possibly in the left psoas muscle.   -Pain medications as needed.  Will hold anticoagulation.   -Discussed with orthopedics (Dr. Ninfa Linden).  No role for surgical intervention currently.   -Was Holding anticoagulation but since hemoglobin is relatively stable will resume today   Permanent Atrial Fibrillation -Patient was on Xarelto as outpatient.   -Was switched over to Lovenox here in the hospital but now held   -Heart rate remained poorly controlled despite being on metoprolol initially and subsequently on Cardizem infusion.   -Subsequently cardiology was consulted due to poorly controlled heart rate and low blood pressures.   -Patient was subsequently started on amiodarone.   -Was given digoxin as well.  Heart rate is reasonably well controlled on IV Amiodarone now and Cardiology  recommends no change today given that HR has been stable in the 90's-100's on Tele -Lovenox continues to be on hold due to acute bleed.   -Since hemoglobin remains stable will resume anticoagulation with a heparin drip   Acute on Chronic Systolic CHF -Care everywhere was reviewed.   -She had an echocardiogram in August 2022 at [facility] which showed EF to be 30%. -Continue to monitor volume status.   -CT scan of the abdomen does raise concern for anasarca. -Cardiology is following.  No invasive work-up is planned as of now due to her tenuous status. -Some concern for hypervolemia but will try some lasix and she was given a dose of IV Lasix 40 mg x1 the day before yesterday, yesterday and will give another 1 today -Strict I's and O's and Daily weights; She was +18.355 Liters since Admission and is now + 15.951 L -Weight is elevated and gone from 253 -> 272    Septic shock  -Patient seems to be stable from a sepsis standpoint.  WBC is improved and trending down and went from 15.4 -> 14.2 -> 14.5 -> 12.2 -> 12.1 -> 9.7 -> 9.4 -Has been off of vasopressors.  -Remained on IV Zosyn but now discontinued on 07/31/21.  Cultures have been negative.     Acute kidney injury -Resolved. -  BUN/creatinine is now 25/0.47 -Avoid further nephrotoxic medications, contrast dyes, hypotension renally dose medications -Repeat CMP in a.m.   Acute Delirium -Appears to have improved.   -Continue Haldol as needed.    -Placed on delirium precautions   Incidental COVID 19 positive. -Completed Remdesivir.   -On room air.  Patient tested positive on 8/24.  Now off of isolation.  Hypothyroidism -C/w Levothyroxine 50 mcg IV Daily    Hyponatremia/hypokalemia/hypomagnesemia -Sodium is stable.  Potassium, magnesium and phosphorus being addressed by pharmacy.    Goals of care -Palliative care met with the patient and spoke to patient's brother who is the next of kin.   -Patient does have a daughter but they are not in  touch with each other.   -Patient was changed over to DNR/DNI based on this conversation.  GERD/GI Prophylaxis -C/w Pantoprazole 40 mg q12h   Morbid Obesity -Complicates overall prognosis and care -Estimated body mass index is 49.88 kg/m as calculated from the following:   Height as of this encounter: _0  (1.575 m).   Weight as of this encounter: 123.7 kg. -Weight Loss and Dietary Counseling given  -Nutritionist recommending continue TPN per pharmacy management  DVT prophylaxis: SCDs Code Status: DO NOT RESUSCITATE  Family Communication: No family present at bedside  Disposition Plan: None  Status is: Inpatient  Remains inpatient appropriate because:Unsafe d/c plan, IV treatments appropriate due to intensity of illness or inability to take PO, and Inpatient level of care appropriate due to severity of illness  Dispo: The patient is from: Home              Anticipated d/c is to: SNF              Patient currently is not medically stable to d/c.   Difficult to place patient No  Consultants:  PCCM Transfer Cardiology  General Surgery Palliative Care Medicine   Procedures: PICC Line   Antimicrobials:  Anti-infectives (From admission, onward)    Start     Dose/Rate Route Frequency Ordered Stop   07/20/21 1000  remdesivir 100 mg in sodium chloride 0.9 % 100 mL IVPB       See Hyperspace for full Linked Orders Report.   100 mg 200 mL/hr over 30 Minutes Intravenous Daily 07/19/21 1040 07/21/21 1148   07/20/21 0200  vancomycin (VANCOREADY) IVPB 750 mg/150 mL  Status:  Discontinued        750 mg 150 mL/hr over 60 Minutes Intravenous Every 24 hours 07/19/21 1036 07/19/21 1449   07/19/21 2300  anidulafungin (ERAXIS) 100 mg in sodium chloride 0.9 % 100 mL IVPB  Status:  Discontinued        100 mg 78 mL/hr over 100 Minutes Intravenous Every 24 hours 07/18/21 2107 07/19/21 1449   07/19/21 1600  vancomycin (VANCOREADY) IVPB 750 mg/150 mL  Status:  Discontinued        750 mg 150  mL/hr over 60 Minutes Intravenous Every 24 hours 07/19/21 0919 07/19/21 1016   07/19/21 1600  vancomycin (VANCOREADY) IVPB 750 mg/150 mL  Status:  Discontinued        750 mg 150 mL/hr over 60 Minutes Intravenous Every 24 hours 07/19/21 1016 07/19/21 1036   07/19/21 1400  piperacillin-tazobactam (ZOSYN) IVPB 3.375 g  Status:  Discontinued        3.375 g 12.5 mL/hr over 240 Minutes Intravenous Every 8 hours 07/19/21 1016 07/31/21 1501   07/19/21 1130  remdesivir 200 mg in sodium chloride 0.9% 250 mL IVPB  See Hyperspace for full Linked Orders Report.   200 mg 580 mL/hr over 30 Minutes Intravenous Once 07/19/21 1040 07/19/21 1241   07/19/21 0100  metroNIDAZOLE (FLAGYL) IVPB 500 mg  Status:  Discontinued        500 mg 100 mL/hr over 60 Minutes Intravenous Every 12 hours 07/18/21 1203 07/18/21 1534   07/18/21 2200  ceFEPIme (MAXIPIME) 2 g in sodium chloride 0.9 % 100 mL IVPB  Status:  Discontinued        2 g 200 mL/hr over 30 Minutes Intravenous Every 12 hours 07/18/21 1156 07/18/21 1203   07/18/21 2200  meropenem (MERREM) 2 g in sodium chloride 0.9 % 100 mL IVPB  Status:  Discontinued        2 g 200 mL/hr over 30 Minutes Intravenous Every 12 hours 07/18/21 2104 07/19/21 1011   07/18/21 2200  anidulafungin (ERAXIS) 200 mg in sodium chloride 0.9 % 200 mL IVPB        200 mg 78 mL/hr over 200 Minutes Intravenous  Once 07/18/21 2108 07/19/21 0419   07/18/21 1800  clindamycin (CLEOCIN) IVPB 900 mg  Status:  Discontinued        900 mg 100 mL/hr over 30 Minutes Intravenous Every 8 hours 07/18/21 1534 07/18/21 2103   07/18/21 1600  vancomycin (VANCOREADY) IVPB 2000 mg/400 mL  Status:  Discontinued        2,000 mg 200 mL/hr over 120 Minutes Intravenous Every 24 hours 07/18/21 1544 07/19/21 0919   07/18/21 1545  piperacillin-tazobactam (ZOSYN) IVPB 3.375 g  Status:  Discontinued        3.375 g 12.5 mL/hr over 240 Minutes Intravenous Every 8 hours 07/18/21 1544 07/18/21 2104   07/18/21 1300   cefTRIAXone (ROCEPHIN) 2 g in sodium chloride 0.9 % 100 mL IVPB  Status:  Discontinued        2 g 200 mL/hr over 30 Minutes Intravenous Every 24 hours 07/18/21 1203 07/18/21 1628   07/18/21 1200  ceFEPIme (MAXIPIME) 2 g in sodium chloride 0.9 % 100 mL IVPB  Status:  Discontinued        2 g 200 mL/hr over 30 Minutes Intravenous Every 12 hours 07/18/21 1155 07/18/21 1156   07/18/21 0930  ceFEPIme (MAXIPIME) 2 g in sodium chloride 0.9 % 100 mL IVPB        2 g 200 mL/hr over 30 Minutes Intravenous  Once 07/18/21 0918 07/18/21 1135   07/18/21 0930  metroNIDAZOLE (FLAGYL) IVPB 500 mg  Status:  Discontinued        500 mg 100 mL/hr over 60 Minutes Intravenous  Once 07/18/21 5993 07/18/21 1534        Subjective: Seen and examined at bedside and thinks she is doing a little bit better today.  Still wanting something to drink.  No chest pain or shortness of breath.  Felt better with diuresis yesterday.  Discussed with nursing about properly documenting her ostomy output and they are agreeable.  No other concerns or complaints at this time.  Objective: Vitals:   08/03/21 0347 08/03/21 0736 08/03/21 0847 08/03/21 1100  BP: 109/72 127/74 (!) 134/91 137/76  Pulse: 98  (!) 109   Resp: 17  15   Temp:  97.8 F (36.6 C)    TempSrc:  Oral    SpO2: 96%  96%   Weight:      Height:        Intake/Output Summary (Last 24 hours) at 08/03/2021 1427 Last data filed at 08/03/2021 0700  Gross per 24 hour  Intake 1035.85 ml  Output 150 ml  Net 885.85 ml    Filed Weights   07/25/21 0307 07/26/21 0340 07/26/21 1333  Weight: 124.3 kg 126.8 kg 123.7 kg   Examination: Physical Exam:  Constitutional: WN/WD overly obese Caucasian female who is improved and calm Eyes: Lids and conjunctivae normal, sclerae anicteric  ENMT: External Ears, Nose appear normal. Grossly normal hearing.  Neck: Appears normal, supple, no cervical masses, normal ROM, no appreciable thyromegaly; no JVD Respiratory: Diminished to  auscultation bilaterally, no wheezing, rales, rhonchi or crackles. Normal respiratory effort and patient is not tachypenic. No accessory muscle use. Unlabored breathing Cardiovascular: Irregularly Irregular and mildly tachycardic, no murmurs / rubs / gallops. S1 and S2 auscultated.  Has lower extremity nonpitting edema/lymphedema and elephantiasis Abdomen: Soft, non-tender, distended secondary body habitus and surgical changes with an Eakin's pouch and some feculent material coming from the fistula. No masses palpated. Bowel sounds positive.  GU: Deferred. Musculoskeletal: No clubbing / cyanosis of digits/nails. No joint deformity upper and lower extremities.  Skin: No rashes, lesions, ulcers but does have some surgical changes in the abdomen from her colocutaneous fistula. No induration; Warm and dry.  Neurologic: CN 2-12 grossly intact with no focal deficits. Romberg sign and cerebellar reflexes not assessed.  Psychiatric: Normal judgment and insight. Alert and oriented x 3. Normal mood and appropriate affect.   Data Reviewed: I have personally reviewed following labs and imaging studies  CBC: Recent Labs  Lab 07/30/21 1411 07/31/21 0101 08/01/21 0156 08/02/21 0938 08/03/21 1011  WBC 14.5* 12.2* 12.1* 9.7 9.4  NEUTROABS  --   --   --  6.6 6.7  HGB 8.4* 7.9* 8.2* 7.7* 8.4*  HCT 26.3* 25.9* 27.1* 26.2* 28.2*  MCV 84.8 87.2 89.1 91.9 90.4  PLT 148* 143* 147* 155 917    Basic Metabolic Panel: Recent Labs  Lab 07/29/21 0500 07/30/21 0218 07/31/21 0101 08/01/21 0156 08/02/21 0938 08/03/21 1011  NA 140 141 142 140 141 138  K 3.7 3.7 4.3 4.6 4.0 4.1  CL 112* 110 115* 112* 110 109  CO2 _0 GLUCOSE 115* 105* 95 94 93 110*  BUN _1 25* 25* 25*  CREATININE 0.47 0.53 0.50 0.52 0.48 0.47  CALCIUM 7.6* 7.9* 7.8* 7.9* 7.7* 7.8*  MG 2.1 2.1  --   --  2.0 1.9  PHOS  --  3.2  --   --  3.4 3.5    GFR: Estimated Creatinine Clearance: 79.8 mL/min (by C-G formula based  on SCr of 0.47 mg/dL). Liver Function Tests: Recent Labs  Lab 07/28/21 0813 07/30/21 0218 08/02/21 0938 08/03/21 1011  AST 33 23 35 41  ALT _2 ALKPHOS 89 92 229* 238*  BILITOT 0.5 0.9 0.9 1.0  PROT 4.9* 4.8* 5.5* 6.1*  ALBUMIN 1.1* 1.1* 1.2* 1.4*    No results for input(s): LIPASE, AMYLASE in the last 168 hours. No results for input(s): AMMONIA in the last 168 hours. Coagulation Profile: No results for input(s): INR, PROTIME in the last 168 hours. Cardiac Enzymes: No results for input(s): CKTOTAL, CKMB, CKMBINDEX, TROPONINI in the last 168 hours. BNP (last 3 results) No results for input(s): PROBNP in the last 8760 hours. HbA1C: No results for input(s): HGBA1C in the last 72 hours. CBG: No results for input(s): GLUCAP in the last 168 hours. Lipid Profile: No results for input(s): CHOL, HDL, LDLCALC, TRIG, CHOLHDL, LDLDIRECT in the last  72 hours.  Thyroid Function Tests: No results for input(s): TSH, T4TOTAL, FREET4, T3FREE, THYROIDAB in the last 72 hours. Anemia Panel: No results for input(s): VITAMINB12, FOLATE, FERRITIN, TIBC, IRON, RETICCTPCT in the last 72 hours. Sepsis Labs: Recent Labs  Lab 07/28/21 0813  PROCALCITON 0.26     No results found for this or any previous visit (from the past 240 hour(s)).   RN Pressure Injury Documentation: Pressure Injury 07/18/21 Coccyx (Active)  07/18/21 2100  Location: Coccyx  Location Orientation:   Staging:   Wound Description (Comments):   Present on Admission:      Pressure Injury 07/18/21 Thigh Posterior;Proximal;Right (Active)  07/18/21 2100  Location: Thigh  Location Orientation: Posterior;Proximal;Right  Staging:   Wound Description (Comments):   Present on Admission:      Estimated body mass index is 49.88 kg/m as calculated from the following:   Height as of this encounter: _0  (1.575 m).   Weight as of this encounter: 123.7 kg.  Malnutrition Type:  Nutrition Problem: Inadequate oral  intake Etiology: altered GI function, acute illness  Malnutrition Characteristics:  Signs/Symptoms: NPO status  Nutrition Interventions:  Interventions: TPN   Radiology Studies: No results found.  Scheduled Meds:  Chlorhexidine Gluconate Cloth  6 each Topical Daily   levothyroxine  50 mcg Intravenous Daily   mouth rinse  15 mL Mouth Rinse BID   pantoprazole (PROTONIX) IV  40 mg Intravenous Q12H   sodium chloride flush  10-40 mL Intracatheter Q12H   Continuous Infusions:  amiodarone 30 mg/hr (08/03/21 0851)   methocarbamol (ROBAXIN) IV 500 mg (07/25/21 2343)   TPN ADULT (ION) 105 mL/hr at 08/02/21 1731   TPN ADULT (ION)      LOS: 16 days   Kerney Elbe, DO [facility] PAGER is on AMION  If 7PM-7AM, please contact night-coverage [email or web address]

## 2021-08-03 NOTE — Progress Notes (Signed)
De-clotting triple lumen PICC: No blood return from red lumen of PICC. Pink tinged flashback noted. Alteplase left to dwell longer.

## 2021-08-03 NOTE — Progress Notes (Signed)
   General Surgery Follow Up Note  Subjective:    Overnight Issues:   Objective:  Vital signs for last 24 hours: Temp:  [96.4 F (35.8 C)-97.8 F (36.6 C)] 97.8 F (36.6 C) (09/09 0736) Pulse Rate:  [61-109] 109 (09/09 0847) Resp:  [15-22] 15 (09/09 0847) BP: (109-150)/(54-91) 134/91 (09/09 0847) SpO2:  [90 %-97 %] 96 % (09/09 0847)  Hemodynamic parameters for last 24 hours:    Intake/Output from previous day: 09/08 0701 - 09/09 0700 In: 1035.9 [I.V.:1035.9] Out: 150 [Drains:150]  Intake/Output this shift: No intake/output data recorded.  Vent settings for last 24 hours:    Physical Exam:  Gen: comfortable, no distress Neuro: non-focal exam HEENT: PERRL Neck: supple CV: RRR Pulm: unlabored breathing Abd: soft, NT, Eakins pouch with unclear o/p GU: clear yellow urine Extr: wwp, no edema   No results found for this or any previous visit (from the past 24 hour(s)).  Assessment & Plan:  Present on Admission:  Colon carcinoma ([place])  Enterocutaneous fistula  Cellulitis  AKI (acute kidney injury) ([place])  Hyponatremia    LOS: 16 days   Additional comments:I reviewed the patient's new clinical lab test results.   and I reviewed the patients new imaging test results.    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 Arnoldo Morale at [place] on 8/12 - path w/ adenocarcinoma  - Midline wound opened by Dr. Arnoldo Morale at bedside 8/24 and now has stool decompressing through CC fistula  - Currently has eakin's pouch with suction to aid in collection of output. WOCN following - TPN and monitor strict I/O. Multiple requests made to primary team and nursing staff, including today to please document stool output so can determine if this is low or high output fistula. Okay for sips with meds and ice chips. No diet advancement until determination of fistula output.    FEN - NPO. TPN. IVF per primary VTE - SCDs, on heparin gtt ID - On Zosyn. Afebrile.  WBC normalized. Afebrile   COVID + Sepsis - off pressors, improved. No on [place] service.  A. Fib on Xarelto at home - on heparin gtt  ABL anemia - FOBT positive 8/24. Monitor. Hgb 9.8 > 8.5 > 7.8>7.7 > 7.3 Hypothyroidism AKI - resolved Severe protein calorie malnutrition - albumin 1.3. Pre-alb <5. On TNA   Jesusita Oka, MD Trauma & General Surgery Please use [email or web address] to contact on call provider  08/03/2021  *Care during the described time interval was provided by me. I have reviewed this patient's available data, including medical history, events of note, physical examination and test results as part of my evaluation.

## 2021-08-03 NOTE — Progress Notes (Signed)
   No changes today AFIB stable On AMIO IV When able convert to PO.  Systolic HF No changes. IV lasix as needed  Will sign off. Please call if ? Candee Furbish, MD

## 2021-08-03 NOTE — Plan of Care (Signed)
  Problem: Respiratory: Goal: Ability to maintain adequate ventilation will improve Outcome: Progressing   Problem: Education: Goal: Knowledge of General Education information will improve Description: Including pain rating scale, medication(s)/side effects and non-pharmacologic comfort measures Outcome: Progressing   Problem: Activity: Goal: Risk for activity intolerance will decrease Outcome: Progressing   Problem: Nutrition: Goal: Adequate nutrition will be maintained Outcome: Progressing

## 2021-08-04 DIAGNOSIS — A419 Sepsis, unspecified organism: Secondary | ICD-10-CM | POA: Diagnosis not present

## 2021-08-04 DIAGNOSIS — N179 Acute kidney failure, unspecified: Secondary | ICD-10-CM | POA: Diagnosis not present

## 2021-08-04 DIAGNOSIS — L03311 Cellulitis of abdominal wall: Secondary | ICD-10-CM | POA: Diagnosis not present

## 2021-08-04 DIAGNOSIS — C189 Malignant neoplasm of colon, unspecified: Secondary | ICD-10-CM | POA: Diagnosis not present

## 2021-08-04 LAB — CBC WITH DIFFERENTIAL/PLATELET
Abs Immature Granulocytes: 0 10*3/uL (ref 0.00–0.07)
Basophils Absolute: 0 10*3/uL (ref 0.0–0.1)
Basophils Relative: 0 %
Eosinophils Absolute: 0.6 10*3/uL — ABNORMAL HIGH (ref 0.0–0.5)
Eosinophils Relative: 7 %
HCT: 26.5 % — ABNORMAL LOW (ref 36.0–46.0)
Hemoglobin: 8 g/dL — ABNORMAL LOW (ref 12.0–15.0)
Lymphocytes Relative: 12 %
Lymphs Abs: 1.1 10*3/uL (ref 0.7–4.0)
MCH: 27.2 pg (ref 26.0–34.0)
MCHC: 30.2 g/dL (ref 30.0–36.0)
MCV: 90.1 fL (ref 80.0–100.0)
Monocytes Absolute: 0.5 10*3/uL (ref 0.1–1.0)
Monocytes Relative: 5 %
Neutro Abs: 6.8 10*3/uL (ref 1.7–7.7)
Neutrophils Relative %: 76 %
Platelets: 183 10*3/uL (ref 150–400)
RBC: 2.94 MIL/uL — ABNORMAL LOW (ref 3.87–5.11)
RDW: 25.6 % — ABNORMAL HIGH (ref 11.5–15.5)
WBC: 9 10*3/uL (ref 4.0–10.5)
nRBC: 0 % (ref 0.0–0.2)
nRBC: 0 /100 WBC

## 2021-08-04 LAB — COMPREHENSIVE METABOLIC PANEL
ALT: 25 U/L (ref 0–44)
AST: 38 U/L (ref 15–41)
Albumin: 1.2 g/dL — ABNORMAL LOW (ref 3.5–5.0)
Alkaline Phosphatase: 210 U/L — ABNORMAL HIGH (ref 38–126)
Anion gap: 4 — ABNORMAL LOW (ref 5–15)
BUN: 25 mg/dL — ABNORMAL HIGH (ref 8–23)
CO2: 29 mmol/L (ref 22–32)
Calcium: 7.9 mg/dL — ABNORMAL LOW (ref 8.9–10.3)
Chloride: 107 mmol/L (ref 98–111)
Creatinine, Ser: 0.54 mg/dL (ref 0.44–1.00)
GFR, Estimated: >60 mL/min (ref >60)
Glucose, Bld: 74 mg/dL (ref 70–99)
Potassium: 3.9 mmol/L (ref 3.5–5.1)
Sodium: 140 mmol/L (ref 135–145)
Total Bilirubin: 0.9 mg/dL (ref 0.3–1.2)
Total Protein: 5.5 g/dL — ABNORMAL LOW (ref 6.5–8.1)

## 2021-08-04 LAB — HEPARIN LEVEL (UNFRACTIONATED)
Heparin Unfractionated: <0.1 IU/mL — ABNORMAL LOW (ref 0.30–0.70)
Heparin Unfractionated: <0.1 IU/mL — ABNORMAL LOW (ref 0.30–0.70)

## 2021-08-04 LAB — MAGNESIUM: Magnesium: 1.9 mg/dL (ref 1.7–2.4)

## 2021-08-04 LAB — PHOSPHORUS: Phosphorus: 3.3 mg/dL (ref 2.5–4.6)

## 2021-08-04 MED ORDER — TRAVASOL 10 % IV SOLN
INTRAVENOUS | Status: AC
  Filled 2021-08-04: qty 1512

## 2021-08-04 NOTE — Progress Notes (Signed)
ANTICOAGULATION CONSULT NOTE - Follow Up Consult  Pharmacy Consult for Heparin Indication: atrial fibrillation  Allergies  Allergen Reactions   Codeine Itching   Compazine [Prochlorperazine Edisylate]    Prednisone Nausea And Vomiting   Tetanus Toxoids Other (See Comments)    Knot like "hen egg" came up    Vicodin [Hydrocodone-Acetaminophen] Itching    Patient Measurements: Height: '5\' 2"'$  (157.5 cm) Weight: 123.7 kg (272 lb 11.3 oz) IBW/kg (Calculated) : 50.1 Heparin Dosing Weight: 80.8 kg  Vital Signs: Temp: 97.7 F (36.5 C) (09/09 2343) Temp Source: Oral (09/09 2343) BP: 90/50 (09/10 0600) Pulse Rate: 82 (09/10 0400)  Labs: Recent Labs    08/02/21 0938 08/03/21 1011 08/04/21 0545 08/04/21 0633  HGB 7.7* 8.4* 8.0*  --   HCT 26.2* 28.2* 26.5*  --   PLT 155 187 183  --   HEPARINUNFRC  --   --   --  <0.10*  CREATININE 0.48 0.47  --   --     Estimated Creatinine Clearance: 79.8 mL/min (by C-G formula based on SCr of 0.47 mg/dL).   Medications:  Scheduled:   Chlorhexidine Gluconate Cloth  6 each Topical Daily   levothyroxine  50 mcg Intravenous Daily   mouth rinse  15 mL Mouth Rinse BID   pantoprazole (PROTONIX) IV  40 mg Intravenous Q12H   sodium chloride flush  10-40 mL Intracatheter Q12H    Assessment: The patient is a 75 year old morbidly obese with GI bleeding secondary to a cecal mass status post right hemicolectomy on 07/06/2021 at outside hospital.  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 Was on Xarelto PTA for atrial fibrillation. Anticoagulation has been on hold since 9/3 due to acute drop in Hgb.  Pharmacy consulted to start Heparin.  Patient has not had Xarelto during this admission (16 days). Patient was started on 1200 units/hr and 8 hr heparin level was <0.1. Given history of GI bleed, increasing the rate to 1350 units/hr and no bolus. CBC stable and continue to monitor for signs and symptoms of bleeding.  Goal of  Therapy:  Heparin level 0.3-0.7 units/ml Monitor platelets by anticoagulation protocol: Yes   Plan:  Increase heparin infusion at 1350 units/hr Check anti-Xa level in 8 hours and daily while on heparin Continue to monitor H&H and platelets   Varney Daily, PharmD PGY1 Pharmacy Resident  Please check AMION for all [facility] pharmacy phone numbers After 10:00 PM call main pharmacy [phone]

## 2021-08-04 NOTE — Progress Notes (Signed)
ANTICOAGULATION CONSULT NOTE - Follow Up Consult  Pharmacy Consult for Heparin Indication: atrial fibrillation  Allergies  Allergen Reactions   Codeine Itching   Compazine [Prochlorperazine Edisylate]    Prednisone Nausea And Vomiting   Tetanus Toxoids Other (See Comments)    Knot like "hen egg" came up    Vicodin [Hydrocodone-Acetaminophen] Itching    Patient Measurements: Height: '5\' 2"'$  (157.5 cm) Weight: 123.7 kg (272 lb 11.3 oz) IBW/kg (Calculated) : 50.1 Heparin Dosing Weight: 80.8 kg  Vital Signs: Temp: 98 F (36.7 C) (09/10 1701) Temp Source: Oral (09/10 1701) BP: 119/79 (09/10 1701) Pulse Rate: 88 (09/10 1701)  Labs: Recent Labs    08/02/21 0938 08/03/21 1011 08/04/21 0545 08/04/21 0600 08/04/21 0633 08/04/21 1600  HGB 7.7* 8.4* 8.0*  --   --   --   HCT 26.2* 28.2* 26.5*  --   --   --   PLT 155 187 183  --   --   --   HEPARINUNFRC  --   --   --   --  <0.10* <0.10*  CREATININE 0.48 0.47  --  0.54  --   --      Estimated Creatinine Clearance: 79.8 mL/min (by C-G formula based on SCr of 0.54 mg/dL).   Medications:  Scheduled:   Chlorhexidine Gluconate Cloth  6 each Topical Daily   levothyroxine  50 mcg Intravenous Daily   mouth rinse  15 mL Mouth Rinse BID   pantoprazole (PROTONIX) IV  40 mg Intravenous Q12H   sodium chloride flush  10-40 mL Intracatheter Q12H    Assessment: The patient is a 75 year old morbidly obese with GI bleeding secondary to a cecal mass status post right hemicolectomy on 07/06/2021 at outside hospital.  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 Was on Xarelto PTA for atrial fibrillation. Anticoagulation has been on hold since 9/3 due to acute drop in Hgb.  Pharmacy consulted to start Heparin.  Patient has not had Xarelto during this admission (16 days). Patient was started on 1200 units/hr and 8 hr heparin level was <0.1. Given history of GI bleed, increasing the rate to 1350 units/hr and no bolus.  CBC stable and continue to monitor for signs and symptoms of bleeding.  PM f/u: Heparin level this evening is undetectable on 1350 units/hr.  Required much higher rate earlier this admission to get to therapeutic levels.  No overt bleeding or complications noted.  Goal of Therapy:  Heparin level 0.3-0.7 units/ml Monitor platelets by anticoagulation protocol: Yes   Plan:  Increase heparin infusion to 1600 units/hr. Repeat heparin level in 8 hrs Daily heparin level and CBC.  [place] Crane, Roylene Reason, BCCP Clinical Pharmacist  08/04/2021 7:28 PM   [facility] pharmacy phone numbers are listed on [email or web address]

## 2021-08-04 NOTE — Progress Notes (Signed)
PHARMACY - TOTAL PARENTERAL NUTRITION CONSULT NOTE  Indication: Fistula  Patient Measurements: Height: 5' 2" (157.5 cm) Weight: 123.7 kg (272 lb 11.3 oz) IBW/kg (Calculated) : 50.1 TPN AdjBW (KG): 66.6 Body mass index is 49.88 kg/m.  Assessment:  65 YOF with recent R-hemicolectomy on 8/12 in the setting of a cecal mass - consistent with R-colon metastatic adenocarcinoma. 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 Pharmacy consulted to manage TPN for nutritional support in the setting of a colocutaneous fistula.    Glucose / Insulin: CBGs controlled on low end, SSI d/c'd 8/28 Electrolytes: K 3.9 (goal >/=4 for Afib), Mag 1.9 (goal >/= 2 for Afib), corrected Ca high-normal ~10.2, others WNL Renal:  SCr < 1 stable, BUN 25. S/p Lasix 49m IV x 1 yesterday Hepatic: LFTs / tibili / TG WNL, alk phos down to 210, albumin 1.2 Intake / Output; MIVF:  drain output 1542m24hrs, stool output 12532m4hrs, UOP 0.4ml8m/hr per documentation (appears inaccurate) GI Imaging: 8/24 Abd CT: colocutaneous fistula, 7.4cm abd wall abscess, soft tissue edema, cellulitis vs nec fasciitis 8/25 Abd xray: diffuse abd wall emphysema, nonobstructive bowel gas pattern 9/4 CT abd/pelvis: medial upper left thigh hematoma GI Surgeries / Procedures: 8/12 (last admit): R-hemicolectomy  Central access: PICC replaced 07/27/21 TPN start date: 07/19/21  Nutritional Goals (RD assessment on 8/25): 2300-2500 kcal, 140-160g AA, fluid >/= 2L per day  Current Nutrition:  TPN; NPO  Plan:  Continue TPN at goal rate of 105 mL/hr, providing 151g AA and 2373 kCal meeting 100% of estimated needs Electrolytes in TPN: Na 125 mEq/L, increase K to 45 mEq/L, Ca 4mEq86m increase Mg to 13mEq77mPhos 22mmol36mCl:Ac 1:1 Add standard MVI and trace elements to TPN Monitor TPN labs F/u Surgery plans - no diet advancement until determination of fistula output per notes   Kalesha Irving vArturo MortonD, BCPS Please check AMION for all [facility] numbers Clinical Pharmacist 08/04/2021 10:16 AM

## 2021-08-04 NOTE — Progress Notes (Signed)
PROGRESS NOTE    Lauren [name]  ZOX:[id number] DOB: 1945-12-17 DOA: 07/18/2021 PCP: Pcp, No   Brief Narrative:  The patient is a 75 year old morbidly obese Caucasian female with a past medical history significant for but not limited to atrial fibrillation, combined chronic systolic and diastolic CHF, hypertension, lymphedema as well as other comorbidities with recent admission to hospital for GI bleeding secondary to cecal mass status post right hemicolectomy on 07/06/2021 at outside hospital.  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  The patient was at [facility] facility and she is noted to be hypotensive complaint of nausea abdominal pain and back pain.  She is found to be COVID-positive.  She brought to the hospital and received IV fluids, vasopressors and antibiotics.  CT scan of the abdomen and pelvis showed fistula extending the bowel adjacent to the suture to an umbilical hernia into the abdominal wall fluid collection in the anterior abdominal wall and gas tracking.  Patient was admitted to the ICU on 07/18/2021 and started on vasopressors and antibiotics.  On 07/19/2001 patient was started on TPN taken off of Levophed.  She is subsequently transferred out of ICU and considered stable.  Hospitalization has been complicated by atrial fibrillation with RVR, new CHF as well as bleeding to left thigh resulting in acute blood loss anemia requiring blood transfusion.  Cardiology been consulted and general surgery is following.  Currently she is n.p.o. and continues to get TPN to allow healing.  General surgery a check on the patient today and have requested accurate output from her fistula which has still not being done properly.  We will need accurate stool output to determine if this is a low or high output fistula to further determine diet advancement.  Per general surgery the patient is okay for sips with meds and ice chips.  She was resumed on a Heparin  gtt yesterday and has had no S/Sx of Bleeding.   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  T3 N2 M0 status.  -CT scan showing some fistula with abdominal wall collection.   -Initially thought to be abscess but when the midline wound was opened stool was noted to be present. General surgery continues to follow.  Patient was placed on bowel rest and TPN was initiated.   -CT scan of the abdomen done 9/4 does not show any new fluid collection except for the left-sided thigh hematoma.  Extensive subcutaneous emphysema was noted as before and thought to be less compared to previous films.  -Remains afebrile.  WBC has been elevated though appears to be improving.   -Patient remained on IV Zosyn but after discussion with general surgery antibiotics were stopped -Remains n.p.o. currently and TPN being continued.  Dilaudid for pain control.  Electrolytes being addressed by pharmacy. -General surgery has advance the patient to sips with meds and chips but we will not recommend further diet advancement until we can determine if this is a high or low output fistula; Continue monitong and she has 50 mL out from her Enterocutaneous Fistula  Colon cancer/cecal mass -Right hemicolectomy on 07/06/21 (done  at [facility]) for cecal mass with adenocarcinoma by pathology.  T3 N2 M0 status.  -Patient was seen by medical oncology (Dr. Chryl Heck) who recommended getting a CT chest to rule out pulmonary mets.   -She feels that if the CT chest did not show any evidence for metastatic process her prognosis could be reasonably good. -CT of the  chest done and showed "Moderate left, small right pleural effusions and associated atelectasis or consolidation. Scattered ground-glass opacities throughout the lungs, nonspecific and infectious or inflammatory. Prominent pretracheal lymph nodes,  likely reactive although nonspecific. Metastatic disease not favored in the absence of other evidence." -We will need to discuss further with medical oncology   Acute Blood Loss Anemia  -Initial Drop in hemoglobin was due to hematoma in the left thigh.   -Unclear as to the reason behind the hematoma.   -No falls or injuries were reported.  Patient was aggressively transfused PRBCs.   -Has received 4 units so far.   -Hemoglobin responded appropriately.  -Repeat CBC showed stable hemoglobin/hematocrit of 8.2/27.1 -> 7.7/26.2 -> 8.4/28.2 -> 8.0/26.5 -Continue to monitor for signs and symptoms of bleeding; currently no overt bleeding noted -Since her hemoglobin is stable we will resume anticoagulation with a heparin drip   Left thigh hematoma/pain in the left hip area -Noted to have hematoma in the left upper thigh muscles as well as possibly in the left psoas muscle.   -Pain medications as needed.  Will hold anticoagulation.   -Discussed with orthopedics (Dr. Ninfa Linden).  No role for surgical intervention currently.   -Was Holding anticoagulation but since hemoglobin is relatively stable will resume today   Permanent Atrial Fibrillation -Patient was on Xarelto as outpatient.   -Was switched over to Lovenox here in the hospital but now held   -Heart rate remained poorly controlled despite being on metoprolol initially and subsequently on Cardizem infusion.   -Subsequently cardiology was consulted due to poorly controlled heart rate and low blood pressures.   -Patient was subsequently started on amiodarone.   -Was given digoxin as well.  Heart rate is reasonably well controlled on IV Amiodarone now and Cardiology recommends no change today given that HR  has been stable in the 90's-100's on Tele -Lovenox continues to be on hold but Since hemoglobin remains stable will resume anticoagulation with a heparin drip and this was done 08/03/21   Acute on Chronic Systolic CHF -Care everywhere was reviewed.   -She had an echocardiogram in August 2022 at [facility] which showed EF to be 30%. -Continue to monitor volume status.   -CT scan of the abdomen does raise concern for anasarca. -Cardiology is following.  No invasive work-up is planned as of now due to her tenuous status. -Some concern for hypervolemia but will try some lasix and will give her IV Lasix 40 mg x1 for a fourth day in a Row -Strict I's and O's and Daily weights; She was +18.355 Liters since Admission and is now + 15.894 L -Weight is elevated and gone from 253 -> 272    Septic shock  -Patient seems to be stable from a sepsis standpoint.  WBC is improved and trending down and went from 15.4 -> 14.2 -> 14.5 -> 12.2 -> 12.1 -> 9.7 -> 9.4 -> 9.0 -Has been off of vasopressors.  -Remained on IV Zosyn but now discontinued on 07/31/21.  Cultures have been negative.     Acute kidney injury -Resolved. -BUN/creatinine is now 25/0.54 -Avoid further nephrotoxic medications, contrast dyes, hypotension  renally dose medications -Repeat CMP in a.m.   Acute Delirium -Appears to have improved.   -Continue Haldol as needed.    -Placed on delirium precautions   Incidental COVID 19 positive. -Completed Remdesivir.   -On room air.  Patient tested positive on 8/24.  Now off of isolation.  Hypothyroidism -C/w Levothyroxine 50 mcg IV Daily    Hyponatremia/hypokalemia/hypomagnesemia -Sodium is stable.  Potassium, magnesium and phosphorus being addressed by pharmacy.  -K+ is 3.9, Mag is 1.9, and Phos is 3.3   Goals of Care -Palliative care met with the patient and spoke to patient's brother who is the next of kin.   -Patient does have a daughter but they are not in touch with each other.   -Patient was  changed over to DNR/DNI based on this conversation.  GERD/GI Prophylaxis -C/w Pantoprazole 40 mg q12h   Morbid Obesity -Complicates overall prognosis and care -Estimated body mass index is 49.88 kg/m as calculated from the following:   Height as of this encounter: 5' 2"  (1.575 m).   Weight as of this encounter: 123.7 kg. -Weight Loss and Dietary Counseling given  -Nutritionist recommending continue TPN per pharmacy management  DVT prophylaxis: SCDs Code Status: DO NOT RESUSCITATE  Family Communication: No family present at bedside  Disposition Plan: None  Status is: Inpatient  Remains inpatient appropriate because:Unsafe d/c plan, IV treatments appropriate due to intensity of illness or inability to take PO, and Inpatient level of care appropriate due to severity of illness  Dispo: The patient is from: Home              Anticipated d/c is to: SNF              Patient currently is not medically stable to d/c.   Difficult to place patient No  Consultants:  PCCM Transfer Cardiology  General Surgery Palliative Care Medicine   Procedures: PICC Line   Antimicrobials:  Anti-infectives (From admission, onward)    Start     Dose/Rate Route Frequency Ordered Stop   07/20/21 1000  remdesivir 100 mg in sodium chloride 0.9 % 100 mL IVPB       See Hyperspace for full Linked Orders Report.   100 mg 200 mL/hr over 30 Minutes Intravenous Daily 07/19/21 1040 07/21/21 1148   07/20/21 0200  vancomycin (VANCOREADY) IVPB 750 mg/150 mL  Status:  Discontinued        750 mg 150 mL/hr over 60 Minutes Intravenous Every 24 hours 07/19/21 1036 07/19/21 1449   07/19/21 2300  anidulafungin (ERAXIS) 100 mg in sodium chloride 0.9 % 100 mL IVPB  Status:  Discontinued        100 mg 78 mL/hr over 100 Minutes Intravenous Every 24 hours 07/18/21 2107 07/19/21 1449   07/19/21 1600  vancomycin (VANCOREADY) IVPB 750 mg/150 mL  Status:  Discontinued        750 mg 150 mL/hr over 60 Minutes Intravenous Every  24 hours 07/19/21 0919 07/19/21 1016   07/19/21 1600  vancomycin (VANCOREADY) IVPB 750 mg/150 mL  Status:  Discontinued        750 mg 150 mL/hr over 60 Minutes Intravenous Every 24 hours 07/19/21 1016 07/19/21 1036   07/19/21 1400  piperacillin-tazobactam (ZOSYN) IVPB 3.375 g  Status:  Discontinued        3.375 g 12.5 mL/hr over 240 Minutes Intravenous Every 8 hours 07/19/21 1016 07/31/21 1501   07/19/21 1130  remdesivir 200 mg in sodium chloride 0.9% 250 mL IVPB  See Hyperspace for full Linked Orders Report.   200 mg 580 mL/hr over 30 Minutes Intravenous Once 07/19/21 1040 07/19/21 1241   07/19/21 0100  metroNIDAZOLE (FLAGYL) IVPB 500 mg  Status:  Discontinued        500 mg 100 mL/hr over 60 Minutes Intravenous Every 12 hours 07/18/21 1203 07/18/21 1534   07/18/21 2200  ceFEPIme (MAXIPIME) 2 g in sodium chloride 0.9 % 100 mL IVPB  Status:  Discontinued        2 g 200 mL/hr over 30 Minutes Intravenous Every 12 hours 07/18/21 1156 07/18/21 1203   07/18/21 2200  meropenem (MERREM) 2 g in sodium chloride 0.9 % 100 mL IVPB  Status:  Discontinued        2 g 200 mL/hr over 30 Minutes Intravenous Every 12 hours 07/18/21 2104 07/19/21 1011   07/18/21 2200  anidulafungin (ERAXIS) 200 mg in sodium chloride 0.9 % 200 mL IVPB        200 mg 78 mL/hr over 200 Minutes Intravenous  Once 07/18/21 2108 07/19/21 0419   07/18/21 1800  clindamycin (CLEOCIN) IVPB 900 mg  Status:  Discontinued        900 mg 100 mL/hr over 30 Minutes Intravenous Every 8 hours 07/18/21 1534 07/18/21 2103   07/18/21 1600  vancomycin (VANCOREADY) IVPB 2000 mg/400 mL  Status:  Discontinued        2,000 mg 200 mL/hr over 120 Minutes Intravenous Every 24 hours 07/18/21 1544 07/19/21 0919   07/18/21 1545  piperacillin-tazobactam (ZOSYN) IVPB 3.375 g  Status:  Discontinued        3.375 g 12.5 mL/hr over 240 Minutes Intravenous Every 8 hours 07/18/21 1544 07/18/21 2104   07/18/21 1300  cefTRIAXone (ROCEPHIN) 2 g in sodium  chloride 0.9 % 100 mL IVPB  Status:  Discontinued        2 g 200 mL/hr over 30 Minutes Intravenous Every 24 hours 07/18/21 1203 07/18/21 1628   07/18/21 1200  ceFEPIme (MAXIPIME) 2 g in sodium chloride 0.9 % 100 mL IVPB  Status:  Discontinued        2 g 200 mL/hr over 30 Minutes Intravenous Every 12 hours 07/18/21 1155 07/18/21 1156   07/18/21 0930  ceFEPIme (MAXIPIME) 2 g in sodium chloride 0.9 % 100 mL IVPB        2 g 200 mL/hr over 30 Minutes Intravenous  Once 07/18/21 0918 07/18/21 1135   07/18/21 0930  metroNIDAZOLE (FLAGYL) IVPB 500 mg  Status:  Discontinued        500 mg 100 mL/hr over 60 Minutes Intravenous  Once 07/18/21 0300 07/18/21 1534        Subjective: Seen and examined at bedside think she is doing fairly well.  Still wanting something to eat and drink.  Denies any chest pain or shortness of breath.  Has no abdominal pain.  No other concerns or complaints this time  Objective: Vitals:   08/03/21 2343 08/04/21 0400 08/04/21 0600 08/04/21 0928  BP: (!) 110/48 (!) 83/50 (!) 90/50 (!) 98/41  Pulse: 94 82  77  Resp: 16 13 16 18   Temp: 97.7 F (36.5 C)   97.6 F (36.4 C)  TempSrc: Oral   Oral  SpO2:      Weight:      Height:        Intake/Output Summary (Last 24 hours) at 08/04/2021 1311 Last data filed at 08/04/2021 0639 Gross per 24 hour  Intake 1991.22 ml  Output 1375 ml  Net 616.22 ml    Filed Weights   07/25/21 0307 07/26/21 0340 07/26/21 1333  Weight: 124.3 kg 126.8 kg 123.7 kg   Examination: Physical Exam:  Constitutional: WN/WD morbidly obese Caucasian female who is improved and in no acute distress appears calm  Eyes: Lids and conjunctivae normal, sclerae anicteric  ENMT: External Ears, Nose appear normal. Grossly normal hearing. Mucous membranes are moist. Posterior pharynx clear of any exudate or lesions. Normal dentition. Neck: Appears normal, supple, no cervical masses, normal ROM, no appreciable thyromegaly; Difficult to assess JVD given body  habitus Respiratory: Diminished to auscultation bilaterally, no wheezing, rales, rhonchi or crackles. Normal respiratory effort and patient is not tachypenic. No accessory muscle use.  Unlabored breathing Cardiovascular: Irregularly irregular and mildly tachycardic, no murmurs / rubs / gallops. S1 and S2 auscultated.  Has lower extremity nonpitting edema/lymphedema and elephantiasis worse on the left compared to right Abdomen: Soft, non-tender, distended secondary body habitus and has surgical changes with an Eakin's pouch from her colocutaneous fistula with feculent material draining into the canister. Bowel sounds positive.  GU: Deferred. Musculoskeletal: No clubbing / cyanosis of digits/nails. No joint deformity upper and lower extremities.  Skin: No rashes, lesions, ulcers on limited skin evaluation but does have some surgical changes abdomen from her colocutaneous fistula. No induration; Warm and dry.  Neurologic: CN 2-12 grossly intact with no focal deficits. Romberg sign and cerebellar reflexes not assessed.  Psychiatric: Normal judgment and insight. Alert and oriented x 3. Normal mood and appropriate affect.   Data Reviewed: I have personally reviewed following labs and imaging studies  CBC: Recent Labs  Lab 07/31/21 0101 08/01/21 0156 08/02/21 0938 08/03/21 1011 08/04/21 0545  WBC 12.2* 12.1* 9.7 9.4 9.0  NEUTROABS  --   --  6.6 6.7 6.8  HGB 7.9* 8.2* 7.7* 8.4* 8.0*  HCT 25.9* 27.1* 26.2* 28.2* 26.5*  MCV 87.2 89.1 91.9 90.4 90.1  PLT 143* 147* 155 187 673    Basic Metabolic Panel: Recent Labs  Lab 07/29/21 0500 07/30/21 0218 07/31/21 0101 08/01/21 0156 08/02/21 0938 08/03/21 1011 08/04/21 0600  NA 140 141 142 140 141 138 140  K 3.7 3.7 4.3 4.6 4.0 4.1 3.9  CL 112* 110 115* 112* 110 109 107  CO2 28 25 24 23 28 26 29   GLUCOSE 115* 105* 95 94 93 110* 74  BUN 17 20 22  25* 25* 25* 25*  CREATININE 0.47 0.53 0.50 0.52 0.48 0.47 0.54  CALCIUM 7.6* 7.9* 7.8* 7.9* 7.7*  7.8* 7.9*  MG 2.1 2.1  --   --  2.0 1.9 1.9  PHOS  --  3.2  --   --  3.4 3.5 3.3    GFR: Estimated Creatinine Clearance: 79.8 mL/min (by C-G formula based on SCr of 0.54 mg/dL). Liver Function Tests: Recent Labs  Lab 07/30/21 0218 08/02/21 0938 08/03/21 1011 08/04/21 0600  AST 23 35 41 38  ALT 15 23 26 25   ALKPHOS 92 229* 238* 210*  BILITOT 0.9 0.9 1.0 0.9  PROT 4.8* 5.5* 6.1* 5.5*  ALBUMIN 1.1* 1.2* 1.4* 1.2*    No results for input(s): LIPASE, AMYLASE in the last 168 hours. No results for input(s): AMMONIA in the last 168 hours. Coagulation Profile: No results for input(s): INR, PROTIME in the last 168 hours. Cardiac Enzymes: No results for input(s): CKTOTAL, CKMB, CKMBINDEX, TROPONINI in the last 168 hours. BNP (last 3 results) No results for input(s): PROBNP in the last 8760 hours. HbA1C: No results for input(s):  HGBA1C in the last 72 hours. CBG: No results for input(s): GLUCAP in the last 168 hours. Lipid Profile: No results for input(s): CHOL, HDL, LDLCALC, TRIG, CHOLHDL, LDLDIRECT in the last 72 hours.  Thyroid Function Tests: No results for input(s): TSH, T4TOTAL, FREET4, T3FREE, THYROIDAB in the last 72 hours. Anemia Panel: No results for input(s): VITAMINB12, FOLATE, FERRITIN, TIBC, IRON, RETICCTPCT in the last 72 hours. Sepsis Labs: No results for input(s): PROCALCITON, LATICACIDVEN in the last 168 hours.   No results found for this or any previous visit (from the past 240 hour(s)).   RN Pressure Injury Documentation: Pressure Injury 07/18/21 Coccyx (Active)  07/18/21 2100  Location: Coccyx  Location Orientation:   Staging:   Wound Description (Comments):   Present on Admission:      Pressure Injury 07/18/21 Thigh Posterior;Proximal;Right (Active)  07/18/21 2100  Location: Thigh  Location Orientation: Posterior;Proximal;Right  Staging:   Wound Description (Comments):   Present on Admission:      Estimated body mass index is 49.88 kg/m as  calculated from the following:   Height as of this encounter: 5' 2"  (1.575 m).   Weight as of this encounter: 123.7 kg.  Malnutrition Type:  Nutrition Problem: Inadequate oral intake Etiology: altered GI function, acute illness  Malnutrition Characteristics:  Signs/Symptoms: NPO status  Nutrition Interventions:  Interventions: TPN   Radiology Studies: No results found.  Scheduled Meds:  Chlorhexidine Gluconate Cloth  6 each Topical Daily   levothyroxine  50 mcg Intravenous Daily   mouth rinse  15 mL Mouth Rinse BID   pantoprazole (PROTONIX) IV  40 mg Intravenous Q12H   sodium chloride flush  10-40 mL Intracatheter Q12H   Continuous Infusions:  amiodarone 30 mg/hr (08/04/21 0554)   heparin 1,350 Units/hr (08/04/21 0729)   methocarbamol (ROBAXIN) IV 500 mg (07/25/21 2343)   TPN ADULT (ION) 105 mL/hr at 08/03/21 1740   TPN ADULT (ION)      LOS: 17 days   Kerney Elbe, DO [facility] PAGER is on [place]  If 7PM-7AM, please contact night-coverage [email or web address]

## 2021-08-05 DIAGNOSIS — N179 Acute kidney failure, unspecified: Secondary | ICD-10-CM | POA: Diagnosis not present

## 2021-08-05 DIAGNOSIS — C189 Malignant neoplasm of colon, unspecified: Secondary | ICD-10-CM | POA: Diagnosis not present

## 2021-08-05 DIAGNOSIS — L03311 Cellulitis of abdominal wall: Secondary | ICD-10-CM | POA: Diagnosis not present

## 2021-08-05 DIAGNOSIS — A419 Sepsis, unspecified organism: Secondary | ICD-10-CM | POA: Diagnosis not present

## 2021-08-05 LAB — CBC WITH DIFFERENTIAL/PLATELET
Abs Immature Granulocytes: 0.16 10*3/uL — ABNORMAL HIGH (ref 0.00–0.07)
Abs Immature Granulocytes: 0.2 10*3/uL — ABNORMAL HIGH (ref 0.00–0.07)
Basophils Absolute: 0.1 10*3/uL (ref 0.0–0.1)
Basophils Absolute: 0.1 10*3/uL (ref 0.0–0.1)
Basophils Relative: 1 %
Basophils Relative: 1 %
Eosinophils Absolute: 0.6 10*3/uL — ABNORMAL HIGH (ref 0.0–0.5)
Eosinophils Absolute: 0.6 10*3/uL — ABNORMAL HIGH (ref 0.0–0.5)
Eosinophils Relative: 7 %
Eosinophils Relative: 6 %
HCT: 26 % — ABNORMAL LOW (ref 36.0–46.0)
HCT: 28.8 % — ABNORMAL LOW (ref 36.0–46.0)
Hemoglobin: 7.7 g/dL — ABNORMAL LOW (ref 12.0–15.0)
Hemoglobin: 8.4 g/dL — ABNORMAL LOW (ref 12.0–15.0)
Immature Granulocytes: 2 %
Immature Granulocytes: 2 %
Lymphocytes Relative: 22 %
Lymphocytes Relative: 19 %
Lymphs Abs: 2 10*3/uL (ref 0.7–4.0)
Lymphs Abs: 1.8 10*3/uL (ref 0.7–4.0)
MCH: 26.8 pg (ref 26.0–34.0)
MCH: 26.8 pg (ref 26.0–34.0)
MCHC: 29.6 g/dL — ABNORMAL LOW (ref 30.0–36.0)
MCHC: 29.2 g/dL — ABNORMAL LOW (ref 30.0–36.0)
MCV: 90.6 fL (ref 80.0–100.0)
MCV: 91.7 fL (ref 80.0–100.0)
Monocytes Absolute: 0.9 10*3/uL (ref 0.1–1.0)
Monocytes Absolute: 1.1 10*3/uL — ABNORMAL HIGH (ref 0.1–1.0)
Monocytes Relative: 10 %
Monocytes Relative: 11 %
Neutro Abs: 5.3 10*3/uL (ref 1.7–7.7)
Neutro Abs: 5.7 10*3/uL (ref 1.7–7.7)
Neutrophils Relative %: 58 %
Neutrophils Relative %: 61 %
Platelets: 202 10*3/uL (ref 150–400)
Platelets: 244 10*3/uL (ref 150–400)
RBC: 2.87 MIL/uL — ABNORMAL LOW (ref 3.87–5.11)
RBC: 3.14 MIL/uL — ABNORMAL LOW (ref 3.87–5.11)
RDW: 25.5 % — ABNORMAL HIGH (ref 11.5–15.5)
RDW: 25.2 % — ABNORMAL HIGH (ref 11.5–15.5)
WBC: 9 10*3/uL (ref 4.0–10.5)
WBC: 9.4 10*3/uL (ref 4.0–10.5)
nRBC: 0.2 % (ref 0.0–0.2)
nRBC: 0.2 % (ref 0.0–0.2)

## 2021-08-05 LAB — COMPREHENSIVE METABOLIC PANEL
ALT: 23 U/L (ref 0–44)
AST: 35 U/L (ref 15–41)
Albumin: 1.3 g/dL — ABNORMAL LOW (ref 3.5–5.0)
Alkaline Phosphatase: 172 U/L — ABNORMAL HIGH (ref 38–126)
Anion gap: 3 — ABNORMAL LOW (ref 5–15)
BUN: 25 mg/dL — ABNORMAL HIGH (ref 8–23)
CO2: 27 mmol/L (ref 22–32)
Calcium: 7.9 mg/dL — ABNORMAL LOW (ref 8.9–10.3)
Chloride: 110 mmol/L (ref 98–111)
Creatinine, Ser: 0.5 mg/dL (ref 0.44–1.00)
GFR, Estimated: >60 mL/min (ref >60)
Glucose, Bld: 80 mg/dL (ref 70–99)
Potassium: 4.1 mmol/L (ref 3.5–5.1)
Sodium: 140 mmol/L (ref 135–145)
Total Bilirubin: 0.9 mg/dL (ref 0.3–1.2)
Total Protein: 5.6 g/dL — ABNORMAL LOW (ref 6.5–8.1)

## 2021-08-05 LAB — HEPARIN LEVEL (UNFRACTIONATED)
Heparin Unfractionated: <0.1 IU/mL — ABNORMAL LOW (ref 0.30–0.70)
Heparin Unfractionated: <0.1 IU/mL — ABNORMAL LOW (ref 0.30–0.70)

## 2021-08-05 LAB — MAGNESIUM: Magnesium: 2 mg/dL (ref 1.7–2.4)

## 2021-08-05 LAB — PHOSPHORUS: Phosphorus: 3.1 mg/dL (ref 2.5–4.6)

## 2021-08-05 MED ORDER — TRAVASOL 10 % IV SOLN
INTRAVENOUS | Status: AC
  Filled 2021-08-05: qty 1512

## 2021-08-05 NOTE — Progress Notes (Signed)
Pt has small amount of output in fistula pouch but no output coming through suction tubing into cannister.  RN documented 0 mL for output and will continue to watch.

## 2021-08-05 NOTE — Progress Notes (Signed)
PHARMACY - TOTAL PARENTERAL NUTRITION CONSULT NOTE  Indication: Fistula  Patient Measurements: Height: 5' 2"  (157.5 cm) Weight: 129 kg (284 lb 6.3 oz) IBW/kg (Calculated) : 50.1 TPN AdjBW (KG): 66.6 Body mass index is 52.02 kg/m.  Assessment:  27 YOF with recent R-hemicolectomy on 8/12 in the setting of a cecal mass - consistent with R-colon metastatic adenocarcinoma. 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 Pharmacy consulted to manage TPN for nutritional support in the setting of a colocutaneous fistula.    Glucose / Insulin: CBGs controlled on low end, SSI d/c'd 8/28 Electrolytes: K 4.1 (goal >/=4 for Afib), Mag 2 (goal >/= 2 for Afib), corrected Ca high-normal ~10.1, others WNL Renal:  SCr < 1, BUN 25 stable Hepatic: LFTs / tibili / TG WNL, alk phos down to 210, albumin 1.3 Intake / Output; MIVF: drain output 25m/24hrs, UOP 0.483mkg/hr per documentation (appears inaccurate) GI Imaging: 8/24 Abd CT: colocutaneous fistula, 7.4cm abd wall abscess, soft tissue edema, cellulitis vs nec fasciitis 8/25 Abd xray: diffuse abd wall emphysema, nonobstructive bowel gas pattern 9/4 CT abd/pelvis: medial upper left thigh hematoma GI Surgeries / Procedures: 8/12 (last admit): R-hemicolectomy  Central access: PICC replaced 07/27/21 TPN start date: 07/19/21  Nutritional Goals (RD assessment on 8/25): 2300-2500 kcal, 140-160g AA, fluid >/= 2L per day  Current Nutrition:  TPN; NPO  Plan:  Continue TPN at goal rate of 105 mL/hr, providing 151g AA and 2373 kCal meeting 100% of estimated needs Electrolytes in TPN: Na 125 mEq/L, K 45 mEq/L, Ca 49m42mL, Mg 1m449m, Phos 22mm10m, Cl:Ac 1:1 Add standard MVI and trace elements to TPN Monitor TPN labs F/u Surgery plans - no diet advancement until determination of fistula output per notes   HaleyArturo MortonrmD, BCPS Please check AMION for all [facility] numbers Clinical  Pharmacist 08/05/2021 8:47 AM

## 2021-08-05 NOTE — Progress Notes (Addendum)
ANTICOAGULATION CONSULT NOTE - Follow Up Consult  Pharmacy Consult for Heparin Indication: atrial fibrillation  Allergies  Allergen Reactions   Codeine Itching   Compazine [Prochlorperazine Edisylate]    Prednisone Nausea And Vomiting   Tetanus Toxoids Other (See Comments)    Knot like "hen egg" came up    Vicodin [Hydrocodone-Acetaminophen] Itching    Patient Measurements: Height: '5\' 2"'$  (157.5 cm) Weight: 129 kg (284 lb 6.3 oz) IBW/kg (Calculated) : 50.1 Heparin Dosing Weight: 80.8 kg  Vital Signs: Temp: 98.2 F (36.8 C) (09/11 0427) Temp Source: Oral (09/11 0427) BP: 98/56 (09/11 0427) Pulse Rate: 99 (09/11 0427)  Labs: Recent Labs    08/03/21 1011 08/04/21 0545 08/04/21 0600 08/04/21 0633 08/04/21 1600 08/05/21 0438 08/05/21 0439  HGB 8.4* 8.0*  --   --   --  7.7*  --   HCT 28.2* 26.5*  --   --   --  26.0*  --   PLT 187 183  --   --   --  202  --   HEPARINUNFRC  --   --   --  <0.10* <0.10*  --  <0.10*  CREATININE 0.47  --  0.54  --   --  0.50  --      Estimated Creatinine Clearance: 82 mL/min (by C-G formula based on SCr of 0.5 mg/dL).   Medications:  Scheduled:   Chlorhexidine Gluconate Cloth  6 each Topical Daily   levothyroxine  50 mcg Intravenous Daily   mouth rinse  15 mL Mouth Rinse BID   pantoprazole (PROTONIX) IV  40 mg Intravenous Q12H   sodium chloride flush  10-40 mL Intracatheter Q12H    Assessment: The patient is a 75 year old morbidly obese with GI bleeding secondary to a cecal mass status post right hemicolectomy on 07/06/2021 at outside hospital.  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 Was on Xarelto PTA for atrial fibrillation. Anticoagulation has been on hold since 9/3 due to acute drop in Hgb.  Pharmacy consulted to start Heparin.  Patient has not had Xarelto during this admission (16 days).   Heparin level still undetectable on 1600 units/hr. Hgb stable at 7.7, plt count normal. No bleeding issues  noted.  Goal of Therapy:  Heparin level 0.3-0.7 units/ml Monitor platelets by anticoagulation protocol: Yes   Plan:  Increase heparin infusion to 2000 units/hr. Repeat heparin level in 8 hrs Daily heparin level and CBC.  Erin Hearing PharmD., BCPS Clinical Pharmacist 08/05/2021 5:40 AM

## 2021-08-05 NOTE — NC FL2 (Signed)
[place] LEVEL OF CARE SCREENING TOOL     IDENTIFICATION  Patient Name: Lauren [name] Birthdate: [birth year] Sex: female Admission Date (Current Location): 07/18/2021  [facility] and [place] Number:  [employer] and Address:  [facility] [address], [place]      Provider Number: [id number]  Attending Physician Name and Address:  Kerney Elbe, DO  Relative Name and Phone Number:  Cheryl Lesnik - spouse [phone]    Current Level of Care: Hospital Recommended Level of Care: [place] Level Prior Approval Number:    Date Approved/Denied:   PASRR Number: [id number]  Discharge Plan: SNF    Current Diagnoses: Patient Active Problem List   Diagnosis Date Noted   Septic shock ([place]) 07/18/2021   Sepsis ([place]) 07/18/2021   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   Cellulitis 07/18/2021   AKI (acute kidney injury) ([place]) 07/18/2021   Hyponatremia 07/18/2021   Colon carcinoma ([place])    Morbid obesity with BMI of 50.0-59.9, adult ([place]) 07/04/2021   Chronic combined systolic and diastolic CHF (congestive heart failure) ([place]) 07/04/2021   Constipation    Colonic mass    Iron deficiency anemia 06/29/2021   Elevated troponin 06/29/2021   Hypokalemia 06/29/2021   Rectal pain 06/29/2021   Chronic acquired lymphedema 02/17/2018   Essential hypertension with goal blood pressure less than 130/80 02/17/2018   Anxiety state 03/24/2014   Gallstone pancreatitis 03/23/2014   Acute cholecystitis 12/06/2012   Atrial fibrillation with RVR ([place]) 12/06/2012   Pancreatitis, acute 12/04/2012   Hypothyroidism 12/04/2012   Obesity (BMI 35.0-39.9 without comorbidity) 12/04/2012    Orientation RESPIRATION BLADDER Height & Weight     Self, Time, Situation, Place  Normal External catheter, Incontinent Weight: 284 lb 6.3 oz (129 kg) Height:  '5\' 2"'$  (157.5 cm)  BEHAVIORAL SYMPTOMS/MOOD NEUROLOGICAL BOWEL NUTRITION STATUS       Incontinent Diet (See DC summary)  AMBULATORY STATUS COMMUNICATION OF NEEDS Skin   Extensive Assist Verbally PU Stage and Appropriate Care, Skin abrasions, Surgical wounds, Other (Comment) (MASD, SI, and PI)                       Personal Care Assistance Level of Assistance  Bathing, Feeding, Dressing Bathing Assistance: Maximum assistance Feeding assistance: Independent Dressing Assistance: Maximum assistance     Functional Limitations Info  Sight, Hearing, Speech Sight Info: Impaired Hearing Info: Adequate Speech Info: Adequate    SPECIAL CARE FACTORS FREQUENCY  PT (By licensed PT), OT (By licensed OT)     PT Frequency: 5x week OT Frequency: 5x week            Contractures Contractures Info: Not present    Additional Factors Info  Code Status, Allergies Code Status Info: DNR Allergies Info: Codeine, Compazine, Prednisone, Tetanus, Vicodin           Current Medications (08/05/2021):  This is the current hospital active medication list Current Facility-Administered Medications  Medication Dose Route Frequency Provider Last Rate Last Admin   amiodarone (NEXTERONE PREMIX) 360-4.14 MG/200ML-% (1.8 mg/mL) IV infusion  30 mg/hr Intravenous Continuous Cherlynn Kaiser A, MD 16.67 mL/hr at 08/05/21 0507 30 mg/hr at 08/05/21 0507   Chlorhexidine Gluconate Cloth 2 % PADS 6 each  6 each Topical Daily Chesley Mires, MD   6 each at 08/04/21 1146   diphenhydrAMINE (BENADRYL) injection 12.5 mg  12.5 mg Intravenous Q6H PRN Pokhrel, Laxman, MD   12.5 mg at 08/01/21 2148  haloperidol lactate (HALDOL) injection 0.5 mg  0.5 mg Intravenous Q6H PRN Bonnielee Haff, MD   0.5 mg at 08/01/21 2148   heparin ADULT infusion 100 units/mL (25000 units/284m)  2,000 Units/hr Intravenous Continuous WLyndee Leo RPH 20 mL/hr at 08/05/21 0552 2,000 Units/hr at 08/05/21 [id number]  HYDROmorphone (DILAUDID) injection 0.5-1 mg  0.5-1 mg Intravenous Q4H PRN KBonnielee Haff MD   1 mg at 08/05/21 [id number]   levothyroxine (SYNTHROID, LEVOTHROID) injection 50 mcg  50 mcg Intravenous Daily GEstill Cotta NP   50 mcg at 08/03/21 0842   MEDLINE mouth rinse  15 mL Mouth Rinse BID Pokhrel, Laxman, MD   15 mL at 08/05/21 0952   methocarbamol (ROBAXIN) 500 mg in dextrose 5 % 50 mL IVPB  500 mg Intravenous Q8H PRN KBonnielee Haff MD 100 mL/hr at 07/25/21 2343 500 mg at 07/25/21 2343   ondansetron (ZOFRAN) injection 4 mg  4 mg Intravenous Q6H PRN GEstill Cotta NP   4 mg at 08/04/21 2216   pantoprazole (PROTONIX) injection 40 mg  40 mg Intravenous Q12H KBonnielee Haff MD   40 mg at 08/04/21 2216   sodium chloride flush (NS) 0.9 % injection 10-40 mL  10-40 mL Intracatheter Q12H Mannam, Praveen, MD   10 mL at 08/05/21 0952   sodium chloride flush (NS) 0.9 % injection 10-40 mL  10-40 mL Intracatheter PRN Mannam, Praveen, MD   10 mL at 07/22/21 2104   sodium chloride flush (NS) 0.9 % injection 10-40 mL  10-40 mL Intracatheter PRN SChesley Mires MD       TPN ADULT (ION)   Intravenous Continuous TPN von DCaren Griffins RPH 105 mL/hr at 08/04/21 1739 New Bag at 08/04/21 1739   TPN ADULT (ION)   Intravenous Continuous TPN [facility]        Discharge Medications: Please see discharge summary for a list of discharge medications.  Relevant Imaging Results:  Relevant Lab Results:   Additional Information SS# [id number] [place]alee [place]

## 2021-08-05 NOTE — Progress Notes (Signed)
Pt had a 1x large dark red bloody & [place] stool. MD notified and heparin gtt held.

## 2021-08-05 NOTE — Progress Notes (Signed)
PROGRESS NOTE    Lauren [name]  NAT:[id number] DOB: 20-Aug-1946 DOA: 07/18/2021 PCP: Pcp, No   Brief Narrative:  The patient is a 75 year old morbidly obese Caucasian female with a past medical history significant for but not limited to atrial fibrillation, combined chronic systolic and diastolic CHF, hypertension, lymphedema as well as other comorbidities with recent admission to hospital for GI bleeding secondary to cecal mass status post right hemicolectomy on 07/06/2021 at outside hospital.  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  The [facility] and she is noted to be hypotensive complaint of nausea abdominal pain and back pain.  She is found to be COVID-positive.  She brought to the hospital and received IV fluids, vasopressors and antibiotics.  CT scan of the abdomen and pelvis showed fistula extending the bowel adjacent to the suture to an umbilical hernia into the abdominal wall fluid collection in the anterior abdominal wall and gas tracking.  Patient was admitted to the ICU on 07/18/2021 and started on vasopressors and antibiotics.  On 07/19/2001 patient was started on TPN taken off of Levophed.  She is subsequently transferred out of ICU and considered stable.  Hospitalization has been complicated by atrial fibrillation with RVR, new CHF as well as bleeding to left thigh resulting in acute blood loss anemia requiring blood transfusion.  Cardiology been consulted and general surgery is following.  Currently she is n.p.o. and continues to get TPN to allow healing.  General surgery a check on the patient today and have requested accurate output from her fistula which has still not being done properly.  We will need accurate stool output to determine if this is a low or high output fistula to further determine diet advancement.  Per general surgery the patient is okay for sips with meds and ice chips.  She was resumed on a Heparin  gtt on 08/03/21 and has had no S/Sx of Bleeding until today when she had bloody stools. Heparin gtt was stopped and will get FOBT and repeat CBC. After Discussion with General Surgery, Dr. Kieth Brightly, he recommended starting CLD tonight.   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  T3 N2 M0 status.  -CT scan showing some fistula with abdominal wall collection.   -Initially thought to be abscess but when the midline wound was opened stool was noted to be present. General surgery continues to follow.  Patient was placed on bowel rest and TPN was initiated.   -CT scan of the abdomen done 9/4 does not show any new fluid collection except for the left-sided thigh hematoma.  Extensive subcutaneous emphysema was noted as before and thought to be less compared to previous films.  -Remains afebrile.  WBC has been elevated though appears to be improving.   -Patient remained on IV Zosyn but after discussion with general surgery antibiotics were stopped -Remains n.p.o. currently and TPN being continued.  Dilaudid for pain control.  Electrolytes being addressed by pharmacy. -General surgery has advanced the patient to sips with meds and chips but we will not recommend further diet advancement until we can  determine if this is a high or low output fistula; Continued monitong and she has 50 mL out from her Enterocutaneous Fistula a few days ago and none now as per Nursing so after my discussion with Dr. Kieth Brightly, he recommended advancing diet to CLD today  Colon Cancer/Cecal Mass -Right hemicolectomy on 07/06/21 (done at [facility])  for cecal mass with adenocarcinoma by pathology.  T3 N2 M0 status.  -Patient was seen by medical oncology (Dr. Chryl Heck) who recommended getting a CT chest to rule out pulmonary mets.   -She feels that if the CT chest did not show any evidence for metastatic process her prognosis could be reasonably good. -CT of the chest done and showed "Moderate left, small right pleural effusions and associated atelectasis or consolidation. Scattered ground-glass opacities throughout the lungs, nonspecific and infectious or inflammatory. Prominent pretracheal lymph nodes,  likely reactive although nonspecific. Metastatic disease not favored in the absence of other evidence." -We will need to discuss further with medical oncology   Acute Blood Loss Anemia  -Initial Drop in hemoglobin was due to hematoma in the left thigh.   -Unclear as to the reason behind the hematoma.   -No falls or injuries were reported.  Patient was aggressively transfused PRBCs.   -Has received 4 units so far.   -Hemoglobin responded appropriately.  -Repeat CBC showed stable hemoglobin/hematocrit of 8.2/27.1 -> 7.7/26.2 -> 8.4/28.2 -> 8.0/26.5 -> 7.7/26.0 -Continue to monitor for signs and symptoms of bleeding; currently now having Bleeding from her Rectum -Hemoglobin dropped again a little bit and she had a large Simmonds stool with mostly dark red blood in it so we will check FOBT, repeat CBC and   Left thigh hematoma/pain in the left hip area -Noted to have hematoma in the left upper thigh muscles as well as possibly in the left psoas muscle.   -Pain medications as needed.  Will hold anticoagulation.   -Discussed with orthopedics (Dr. Ninfa Linden).  No role for surgical intervention currently.   -Anticoagulation resumed but will hold again today given bleeding from rectum   Permanent Atrial Fibrillation -Patient was on Xarelto as outpatient.   -Was switched over to Lovenox here in the hospital but now held   -Heart rate remained poorly  controlled despite being on metoprolol initially and subsequently on Cardizem infusion.   -Subsequently cardiology was consulted due to poorly controlled heart rate and low blood pressures.   -Patient was subsequently started on amiodarone.   -Was given digoxin as well.  Heart rate is reasonably well controlled on IV Amiodarone now and Cardiology recommends no change today given that HR has been stable in the 90's-100's on Tele -Lovenox continues to be on hold but Since hemoglobin remained stable resumed anticoagulation with a heparin drip and this was done 08/03/21 but now held again today    Acute on Chronic Systolic CHF -Care everywhere was reviewed.   -She had an echocardiogram in August 2022 at [facility] which showed EF to be 30%. -Continue to monitor volume status.   -CT scan of the abdomen does raise concern for anasarca. -Cardiology is following.  No invasive work-up is planned as of now due to her tenuous status. -Some concern for hypervolemia but will try some lasix and will give her IV Lasix 40 mg x1 for 4 days in a row but did not want it today and will resume in the AM  -Strict I's and O's and Daily weights; She was +18.355 Liters since Admission and is now +  16.806 L -Weight is elevated and gone from 253 -> 272 -> 282   Septic shock  -Patient seems to be stable from a sepsis standpoint.  WBC is improved and trending down and went from 15.4 -> 14.2 -> 14.5 -> 12.2 -> 12.1 -> 9.7 -> 9.4 -> 9.0 x2 -Has been off of vasopressors.  -Remained on IV Zosyn but now discontinued on 07/31/21.  Cultures have been negative.     Acute kidney injury -Resolved. -BUN/creatinine is now 25/0.50 -Avoid further nephrotoxic medications, contrast dyes, hypotension renally dose medications -Repeat CMP in a.m.   Acute Delirium -Appears to have improved.   -Continue Haldol as needed.    -Placed on delirium precautions   Incidental COVID 19 Positive. -Completed Remdesivir.   -On room air.  Patient tested  positive on 8/24.  Now off of isolation.  Hypothyroidism -C/w Levothyroxine 50 mcg IV Daily    Hyponatremia/Hypokalemia/Hypomagnesemia -Sodium is stable.  Potassium, magnesium and phosphorus being addressed by pharmacy.  -K+ is 4.1, Mag is 2.0, and Phos is 3.1   Goals of Care -Palliative care met with the patient and spoke to patient's brother who is the next of kin.   -Patient does have a daughter but they are not in touch with each other.   -Patient was changed over to DNR/DNI based on this conversation.  GERD/GI Prophylaxis -C/w Pantoprazole 40 mg q12h   Morbid Obesity -Complicates overall prognosis and care -Estimated body mass index is 52.02 kg/m as calculated from the following:   Height as of this encounter: _0  (1.575 m).   Weight as of this encounter: 129 kg. -Weight Loss and Dietary Counseling given  -Nutritionist recommending continue TPN per pharmacy management  DVT prophylaxis: SCDs Code Status: DO NOT RESUSCITATE  Family Communication: No family present at bedside  Disposition Plan: None  Status is: Inpatient  Remains inpatient appropriate because:Unsafe d/c plan, IV treatments appropriate due to intensity of illness or inability to take PO, and Inpatient level of care appropriate due to severity of illness  Dispo: The patient is from: Home              Anticipated d/c is to: SNF              Patient currently is not medically stable to d/c.   Difficult to place patient No  Consultants:  PCCM Transfer Cardiology  General Surgery Palliative Care Medicine   Procedures: PICC Line   Antimicrobials:  Anti-infectives (From admission, onward)    Start     Dose/Rate Route Frequency Ordered Stop   07/20/21 1000  remdesivir 100 mg in sodium chloride 0.9 % 100 mL IVPB       See Hyperspace for full Linked Orders Report.   100 mg 200 mL/hr over 30 Minutes Intravenous Daily 07/19/21 1040 07/21/21 1148   07/20/21 0200  vancomycin (VANCOREADY) IVPB 750 mg/150  mL  Status:  Discontinued        750 mg 150 mL/hr over 60 Minutes Intravenous Every 24 hours 07/19/21 1036 07/19/21 1449   07/19/21 2300  anidulafungin (ERAXIS) 100 mg in sodium chloride 0.9 % 100 mL IVPB  Status:  Discontinued        100 mg 78 mL/hr over 100 Minutes Intravenous Every 24 hours 07/18/21 2107 07/19/21 1449   07/19/21 1600  vancomycin (VANCOREADY) IVPB 750 mg/150 mL  Status:  Discontinued        750 mg 150 mL/hr over 60 Minutes Intravenous Every 24 hours 07/19/21 0919  07/19/21 1016   07/19/21 1600  vancomycin (VANCOREADY) IVPB 750 mg/150 mL  Status:  Discontinued        750 mg 150 mL/hr over 60 Minutes Intravenous Every 24 hours 07/19/21 1016 07/19/21 1036   07/19/21 1400  piperacillin-tazobactam (ZOSYN) IVPB 3.375 g  Status:  Discontinued        3.375 g 12.5 mL/hr over 240 Minutes Intravenous Every 8 hours 07/19/21 1016 07/31/21 1501   07/19/21 1130  remdesivir 200 mg in sodium chloride 0.9% 250 mL IVPB       See Hyperspace for full Linked Orders Report.   200 mg 580 mL/hr over 30 Minutes Intravenous Once 07/19/21 1040 07/19/21 1241   07/19/21 0100  metroNIDAZOLE (FLAGYL) IVPB 500 mg  Status:  Discontinued        500 mg 100 mL/hr over 60 Minutes Intravenous Every 12 hours 07/18/21 1203 07/18/21 1534   07/18/21 2200  ceFEPIme (MAXIPIME) 2 g in sodium chloride 0.9 % 100 mL IVPB  Status:  Discontinued        2 g 200 mL/hr over 30 Minutes Intravenous Every 12 hours 07/18/21 1156 07/18/21 1203   07/18/21 2200  meropenem (MERREM) 2 g in sodium chloride 0.9 % 100 mL IVPB  Status:  Discontinued        2 g 200 mL/hr over 30 Minutes Intravenous Every 12 hours 07/18/21 2104 07/19/21 1011   07/18/21 2200  anidulafungin (ERAXIS) 200 mg in sodium chloride 0.9 % 200 mL IVPB        200 mg 78 mL/hr over 200 Minutes Intravenous  Once 07/18/21 2108 07/19/21 0419   07/18/21 1800  clindamycin (CLEOCIN) IVPB 900 mg  Status:  Discontinued        900 mg 100 mL/hr over 30 Minutes Intravenous  Every 8 hours 07/18/21 1534 07/18/21 2103   07/18/21 1600  vancomycin (VANCOREADY) IVPB 2000 mg/400 mL  Status:  Discontinued        2,000 mg 200 mL/hr over 120 Minutes Intravenous Every 24 hours 07/18/21 1544 07/19/21 0919   07/18/21 1545  piperacillin-tazobactam (ZOSYN) IVPB 3.375 g  Status:  Discontinued        3.375 g 12.5 mL/hr over 240 Minutes Intravenous Every 8 hours 07/18/21 1544 07/18/21 2104   07/18/21 1300  cefTRIAXone (ROCEPHIN) 2 g in sodium chloride 0.9 % 100 mL IVPB  Status:  Discontinued        2 g 200 mL/hr over 30 Minutes Intravenous Every 24 hours 07/18/21 1203 07/18/21 1628   07/18/21 1200  ceFEPIme (MAXIPIME) 2 g in sodium chloride 0.9 % 100 mL IVPB  Status:  Discontinued        2 g 200 mL/hr over 30 Minutes Intravenous Every 12 hours 07/18/21 1155 07/18/21 1156   07/18/21 0930  ceFEPIme (MAXIPIME) 2 g in sodium chloride 0.9 % 100 mL IVPB        2 g 200 mL/hr over 30 Minutes Intravenous  Once 07/18/21 0918 07/18/21 1135   07/18/21 0930  metroNIDAZOLE (FLAGYL) IVPB 500 mg  Status:  Discontinued        500 mg 100 mL/hr over 60 Minutes Intravenous  Once 07/18/21 8416 07/18/21 1534        Subjective: Seen and examined at she is still wanting a diet.  This afternoon she ended up having a large bowel movement that was bloody so we will hold her heparin drip.  We will obtain a FOBT and repeat CBC.  Given that she has  very minimal output from her colocutaneous fistula will start her on a clear liquid diet per surgery recommendations.  No other concerns or complaints at this time.  Objective: Vitals:   08/04/21 2118 08/05/21 0427 08/05/21 1016 08/05/21 1149  BP:  (!) 98/56 (!) 100/46 (!) 120/50  Pulse: 99 99 90   Resp:  17    Temp: 97.6 F (36.4 C) 98.2 F (36.8 C)  98.3 F (36.8 C)  TempSrc: Axillary Oral Oral Oral  SpO2:   99%   Weight:  129 kg    Height:        Intake/Output Summary (Last 24 hours) at 08/05/2021 1536 Last data filed at 08/05/2021 1027 Gross  per 24 hour  Intake 2373.29 ml  Output 1250 ml  Net 1123.29 ml    Filed Weights   07/26/21 0340 07/26/21 1333 08/05/21 0427  Weight: 126.8 kg 123.7 kg 129 kg   Examination: Physical Exam:  Constitutional: WN/WD morbidly obese Caucasian female who is in no acute distress  Eyes: Lids and conjunctivae normal, sclerae anicteric  ENMT: External Ears, Nose appear normal. Grossly normal hearing. Mucous membranes are moist. Posterior pharynx clear of any exudate or lesions. Normal dentition.  Neck: Appears normal, supple, no cervical masses, normal ROM, no appreciable thyromegaly; difficult to assess JVD given her body habitus Respiratory: Diminished to auscultation bilaterally, no wheezing, rales, rhonchi or crackles. Normal respiratory effort and patient is not tachypenic. No accessory muscle use.  Unlabored breathing not wearing supplemental oxygen via nasal cannula Cardiovascular: Irregularly irregular and mildly tachycardic, no murmurs / rubs / gallops. S1 and S2 auscultated.  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  Mildly in the pouch but not in the canister.  Bowel sounds positive GU: Deferred. Musculoskeletal: No clubbing / cyanosis of digits/nails. No joint deformity upper and lower extremities. Skin: No rashes, lesions, ulcers on limited skin evaluation. No induration; Warm and dry.  Neurologic: CN 2-12 grossly intact with no focal deficits. Romberg sign and cerebellar reflexes not assessed.  Psychiatric: Normal judgment and insight. Alert and oriented x 3. Normal mood and appropriate affect.   Data Reviewed: I have personally reviewed following labs and imaging studies  CBC: Recent Labs  Lab 08/01/21 0156 08/02/21 0938 08/03/21 1011  08/04/21 0545 08/05/21 0438  WBC 12.1* 9.7 9.4 9.0 9.0  NEUTROABS  --  6.6 6.7 6.8 5.3  HGB 8.2* 7.7* 8.4* 8.0* 7.7*  HCT 27.1* 26.2* 28.2* 26.5* 26.0*  MCV 89.1 91.9 90.4 90.1 90.6  PLT 147* 155 187 183 579    Basic Metabolic Panel: Recent Labs  Lab 07/30/21 0218 07/31/21 0101 08/01/21 0156 08/02/21 0938 08/03/21 1011 08/04/21 0600 08/05/21 0438  NA 141   < > 140 141 138 140 140  K 3.7   < > 4.6 4.0 4.1 3.9 4.1  CL 110   < > 112* 110 109 107 110  CO2 25   < > _0 GLUCOSE 105*   < > 94 93 110* 74 80  BUN 20   < > 25* 25* 25* 25* 25*  CREATININE 0.53   < > 0.52 0.48 0.47 0.54 0.50  CALCIUM 7.9*   < > 7.9* 7.7* 7.8* 7.9* 7.9*  MG 2.1  --   --  2.0  1.9 1.9 2.0  PHOS 3.2  --   --  3.4 3.5 3.3 3.1   < > = values in this interval not displayed.    GFR: Estimated Creatinine Clearance: 82 mL/min (by C-G formula based on SCr of 0.5 mg/dL). Liver Function Tests: Recent Labs  Lab 07/30/21 0218 08/02/21 0938 08/03/21 1011 08/04/21 0600 08/05/21 0438  AST 23 35 41 38 35  ALT _0 ALKPHOS 92 229* 238* 210* 172*  BILITOT 0.9 0.9 1.0 0.9 0.9  PROT 4.8* 5.5* 6.1* 5.5* 5.6*  ALBUMIN 1.1* 1.2* 1.4* 1.2* 1.3*    No results for input(s): LIPASE, AMYLASE in the last 168 hours. No results for input(s): AMMONIA in the last 168 hours. Coagulation Profile: No results for input(s): INR, PROTIME in the last 168 hours. Cardiac Enzymes: No results for input(s): CKTOTAL, CKMB, CKMBINDEX, TROPONINI in the last 168 hours. BNP (last 3 results) No results for input(s): PROBNP in the last 8760 hours. HbA1C: No results for input(s): HGBA1C in the last 72 hours. CBG: No results for input(s): GLUCAP in the last 168 hours. Lipid Profile: No results for input(s): CHOL, HDL, LDLCALC, TRIG, CHOLHDL, LDLDIRECT in the last 72 hours.  Thyroid Function Tests: No results for input(s): TSH, T4TOTAL, FREET4, T3FREE, THYROIDAB in the last 72 hours. Anemia Panel: No results  for input(s): VITAMINB12, FOLATE, FERRITIN, TIBC, IRON, RETICCTPCT in the last 72 hours. Sepsis Labs: No results for input(s): PROCALCITON, LATICACIDVEN in the last 168 hours.   No results found for this or any previous visit (from the past 240 hour(s)).   RN Pressure Injury Documentation: Pressure Injury 07/18/21 Coccyx (Active)  07/18/21 2100  Location: Coccyx  Location Orientation:   Staging:   Wound Description (Comments):   Present on Admission:      Pressure Injury 07/18/21 Thigh Posterior;Proximal;Right (Active)  07/18/21 2100  Location: Thigh  Location Orientation: Posterior;Proximal;Right  Staging:   Wound Description (Comments):   Present on Admission:      Estimated body mass index is 52.02 kg/m as calculated from the following:   Height as of this encounter: _1  (1.575 m).   Weight as of this encounter: 129 kg.  Malnutrition Type:  Nutrition Problem: Inadequate oral intake Etiology: altered GI function, acute illness  Malnutrition Characteristics:  Signs/Symptoms: NPO status  Nutrition Interventions:  Interventions: TPN   Radiology Studies: No results found.  Scheduled Meds:  Chlorhexidine Gluconate Cloth  6 each Topical Daily   levothyroxine  50 mcg Intravenous Daily   mouth rinse  15 mL Mouth Rinse BID   pantoprazole (PROTONIX) IV  40 mg Intravenous Q12H   sodium chloride flush  10-40 mL Intracatheter Q12H   Continuous Infusions:  amiodarone 30 mg/hr (08/05/21 0507)   heparin Stopped (08/05/21 1400)   methocarbamol (ROBAXIN) IV 500 mg (07/25/21 2343)   TPN ADULT (ION) 105 mL/hr at 08/04/21 1739   TPN ADULT (ION)      LOS: 18 days   Kerney Elbe, DO [facility] PAGER is on AMION  If 7PM-7AM, please contact night-coverage [email or web address]

## 2021-08-05 NOTE — Plan of Care (Signed)
  Problem: Safety: Goal: Ability to remain free from injury will improve Outcome: Progressing   Problem: Education: Goal: Knowledge about tracheostomy care/management will improve Outcome: Progressing   Problem: Activity: Goal: Ability to tolerate increased activity will improve Outcome: Progressing   Problem: Respiratory: Goal: Patent airway maintenance will improve Outcome: Progressing

## 2021-08-06 DIAGNOSIS — A419 Sepsis, unspecified organism: Secondary | ICD-10-CM | POA: Diagnosis not present

## 2021-08-06 DIAGNOSIS — K625 Hemorrhage of anus and rectum: Secondary | ICD-10-CM

## 2021-08-06 DIAGNOSIS — L03311 Cellulitis of abdominal wall: Secondary | ICD-10-CM | POA: Diagnosis not present

## 2021-08-06 DIAGNOSIS — N179 Acute kidney failure, unspecified: Secondary | ICD-10-CM | POA: Diagnosis not present

## 2021-08-06 DIAGNOSIS — C189 Malignant neoplasm of colon, unspecified: Secondary | ICD-10-CM | POA: Diagnosis not present

## 2021-08-06 LAB — CBC WITH DIFFERENTIAL/PLATELET
Abs Immature Granulocytes: 0.21 10*3/uL — ABNORMAL HIGH (ref 0.00–0.07)
Basophils Absolute: 0.1 10*3/uL (ref 0.0–0.1)
Basophils Relative: 1 %
Eosinophils Absolute: 0.6 10*3/uL — ABNORMAL HIGH (ref 0.0–0.5)
Eosinophils Relative: 7 %
HCT: 25.7 % — ABNORMAL LOW (ref 36.0–46.0)
Hemoglobin: 7.6 g/dL — ABNORMAL LOW (ref 12.0–15.0)
Immature Granulocytes: 2 %
Lymphocytes Relative: 19 %
Lymphs Abs: 1.7 10*3/uL (ref 0.7–4.0)
MCH: 27.2 pg (ref 26.0–34.0)
MCHC: 29.6 g/dL — ABNORMAL LOW (ref 30.0–36.0)
MCV: 92.1 fL (ref 80.0–100.0)
Monocytes Absolute: 0.9 10*3/uL (ref 0.1–1.0)
Monocytes Relative: 10 %
Neutro Abs: 5.5 10*3/uL (ref 1.7–7.7)
Neutrophils Relative %: 61 %
Platelets: 207 10*3/uL (ref 150–400)
RBC: 2.79 MIL/uL — ABNORMAL LOW (ref 3.87–5.11)
RDW: 25.2 % — ABNORMAL HIGH (ref 11.5–15.5)
WBC: 8.9 10*3/uL (ref 4.0–10.5)
nRBC: 0.3 % — ABNORMAL HIGH (ref 0.0–0.2)

## 2021-08-06 LAB — COMPREHENSIVE METABOLIC PANEL
ALT: 27 U/L (ref 0–44)
AST: 40 U/L (ref 15–41)
Albumin: 1.3 g/dL — ABNORMAL LOW (ref 3.5–5.0)
Alkaline Phosphatase: 186 U/L — ABNORMAL HIGH (ref 38–126)
Anion gap: 5 (ref 5–15)
BUN: 23 mg/dL (ref 8–23)
CO2: 26 mmol/L (ref 22–32)
Calcium: 7.9 mg/dL — ABNORMAL LOW (ref 8.9–10.3)
Chloride: 107 mmol/L (ref 98–111)
Creatinine, Ser: 0.49 mg/dL (ref 0.44–1.00)
GFR, Estimated: >60 mL/min (ref >60)
Glucose, Bld: 91 mg/dL (ref 70–99)
Potassium: 4.2 mmol/L (ref 3.5–5.1)
Sodium: 138 mmol/L (ref 135–145)
Total Bilirubin: 0.7 mg/dL (ref 0.3–1.2)
Total Protein: 5.5 g/dL — ABNORMAL LOW (ref 6.5–8.1)

## 2021-08-06 LAB — PHOSPHORUS: Phosphorus: 3.3 mg/dL (ref 2.5–4.6)

## 2021-08-06 LAB — TRIGLYCERIDES: Triglycerides: 92 mg/dL (ref <150)

## 2021-08-06 LAB — MAGNESIUM: Magnesium: 2.1 mg/dL (ref 1.7–2.4)

## 2021-08-06 MED ORDER — ALTEPLASE 2 MG IJ SOLR
2.0000 mg | Freq: Once | INTRAMUSCULAR | Status: AC
  Administered 2021-08-06: 2 mg
  Filled 2021-08-06: qty 2

## 2021-08-06 MED ORDER — CHROMIC CHLORIDE 40 MCG/10ML IV SOLN
INTRAVENOUS | Status: AC
Start: 2021-08-06 — End: 2021-08-07
  Filled 2021-08-06: qty 1512

## 2021-08-06 NOTE — Progress Notes (Signed)
Subjective: CC: Minimal output from fistula on wall suction w/ eakin's pouch in the last several days. 125cc/24 hours from 9/9 - 9/10, none since then. She denies abdominal pain at rest. Tolerating clears without n/v. She had a BM sat and sun. Unsure of size or consistency.   Objective: Vital signs in last 24 hours: Temp:  [97.8 F (36.6 C)-98.6 F (37 C)] 98.6 F (37 C) (09/12 0800) Pulse Rate:  [90-96] 90 (09/12 0800) Resp:  [18] 18 (09/12 0800) BP: (100-140)/(37-76) 100/37 (09/12 0800) SpO2:  [97 %-99 %] 97 % (09/12 0800) Last BM Date: 08/05/21  Intake/Output from previous day: 09/11 0701 - 09/12 0700 In: 2051.5 [I.V.:1451.5; Blood:600] Out: 700 [Urine:700] Intake/Output this shift: No intake/output data recorded.  PE: Gen:  Alert, NAD, pleasant Abd: Soft, obese, ND, NT, +BS, eakins pouch w/ minimal output (small amount of light Hedman stool in bag and tubing, none in cannister)  Lab Results:  Recent Labs    08/05/21 1940 08/06/21 0419  WBC 9.4 8.9  HGB 8.4* 7.6*  HCT 28.8* 25.7*  PLT 244 207   BMET Recent Labs    08/05/21 0438 08/06/21 0419  NA 140 138  K 4.1 4.2  CL 110 107  CO2 27 26  GLUCOSE 80 91  BUN 25* 23  CREATININE 0.50 0.49  CALCIUM 7.9* 7.9*   PT/INR No results for input(s): LABPROT, INR in the last 72 hours. CMP     Component Value Date/Time   NA 138 08/06/2021 0419   K 4.2 08/06/2021 0419   CL 107 08/06/2021 0419   CO2 26 08/06/2021 0419   GLUCOSE 91 08/06/2021 0419   BUN 23 08/06/2021 0419   CREATININE 0.49 08/06/2021 0419   CALCIUM 7.9 (L) 08/06/2021 0419   PROT 5.5 (L) 08/06/2021 0419   ALBUMIN 1.3 (L) 08/06/2021 0419   AST 40 08/06/2021 0419   ALT 27 08/06/2021 0419   ALKPHOS 186 (H) 08/06/2021 0419   BILITOT 0.7 08/06/2021 0419   GFRNONAA >60 08/06/2021 0419   GFRAA >90 03/28/2014 0435   Lipase     Component Value Date/Time   LIPASE 25 03/27/2014 0600    Studies/Results: No results  found.  Anti-infectives: Anti-infectives (From admission, onward)    Start     Dose/Rate Route Frequency Ordered Stop   07/20/21 1000  remdesivir 100 mg in sodium chloride 0.9 % 100 mL IVPB       See Hyperspace for full Linked Orders Report.   100 mg 200 mL/hr over 30 Minutes Intravenous Daily 07/19/21 1040 07/21/21 1148   07/20/21 0200  vancomycin (VANCOREADY) IVPB 750 mg/150 mL  Status:  Discontinued        750 mg 150 mL/hr over 60 Minutes Intravenous Every 24 hours 07/19/21 1036 07/19/21 1449   07/19/21 2300  anidulafungin (ERAXIS) 100 mg in sodium chloride 0.9 % 100 mL IVPB  Status:  Discontinued        100 mg 78 mL/hr over 100 Minutes Intravenous Every 24 hours 07/18/21 2107 07/19/21 1449   07/19/21 1600  vancomycin (VANCOREADY) IVPB 750 mg/150 mL  Status:  Discontinued        750 mg 150 mL/hr over 60 Minutes Intravenous Every 24 hours 07/19/21 0919 07/19/21 1016   07/19/21 1600  vancomycin (VANCOREADY) IVPB 750 mg/150 mL  Status:  Discontinued        750 mg 150 mL/hr over 60 Minutes Intravenous Every 24 hours 07/19/21 1016 07/19/21 1036  07/19/21 1400  piperacillin-tazobactam (ZOSYN) IVPB 3.375 g  Status:  Discontinued        3.375 g 12.5 mL/hr over 240 Minutes Intravenous Every 8 hours 07/19/21 1016 07/31/21 1501   07/19/21 1130  remdesivir 200 mg in sodium chloride 0.9% 250 mL IVPB       See Hyperspace for full Linked Orders Report.   200 mg 580 mL/hr over 30 Minutes Intravenous Once 07/19/21 1040 07/19/21 1241   07/19/21 0100  metroNIDAZOLE (FLAGYL) IVPB 500 mg  Status:  Discontinued        500 mg 100 mL/hr over 60 Minutes Intravenous Every 12 hours 07/18/21 1203 07/18/21 1534   07/18/21 2200  ceFEPIme (MAXIPIME) 2 g in sodium chloride 0.9 % 100 mL IVPB  Status:  Discontinued        2 g 200 mL/hr over 30 Minutes Intravenous Every 12 hours 07/18/21 1156 07/18/21 1203   07/18/21 2200  meropenem (MERREM) 2 g in sodium chloride 0.9 % 100 mL IVPB  Status:  Discontinued         2 g 200 mL/hr over 30 Minutes Intravenous Every 12 hours 07/18/21 2104 07/19/21 1011   07/18/21 2200  anidulafungin (ERAXIS) 200 mg in sodium chloride 0.9 % 200 mL IVPB        200 mg 78 mL/hr over 200 Minutes Intravenous  Once 07/18/21 2108 07/19/21 0419   07/18/21 1800  clindamycin (CLEOCIN) IVPB 900 mg  Status:  Discontinued        900 mg 100 mL/hr over 30 Minutes Intravenous Every 8 hours 07/18/21 1534 07/18/21 2103   07/18/21 1600  vancomycin (VANCOREADY) IVPB 2000 mg/400 mL  Status:  Discontinued        2,000 mg 200 mL/hr over 120 Minutes Intravenous Every 24 hours 07/18/21 1544 07/19/21 0919   07/18/21 1545  piperacillin-tazobactam (ZOSYN) IVPB 3.375 g  Status:  Discontinued        3.375 g 12.5 mL/hr over 240 Minutes Intravenous Every 8 hours 07/18/21 1544 07/18/21 2104   07/18/21 1300  cefTRIAXone (ROCEPHIN) 2 g in sodium chloride 0.9 % 100 mL IVPB  Status:  Discontinued        2 g 200 mL/hr over 30 Minutes Intravenous Every 24 hours 07/18/21 1203 07/18/21 1628   07/18/21 1200  ceFEPIme (MAXIPIME) 2 g in sodium chloride 0.9 % 100 mL IVPB  Status:  Discontinued        2 g 200 mL/hr over 30 Minutes Intravenous Every 12 hours 07/18/21 1155 07/18/21 1156   07/18/21 0930  ceFEPIme (MAXIPIME) 2 g in sodium chloride 0.9 % 100 mL IVPB        2 g 200 mL/hr over 30 Minutes Intravenous  Once 07/18/21 0918 07/18/21 1135   07/18/21 0930  metroNIDAZOLE (FLAGYL) IVPB 500 mg  Status:  Discontinued        500 mg 100 mL/hr over 60 Minutes Intravenous  Once 07/18/21 [id number] 07/18/21 1534        Assessment/Plan 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 Oncology has seen on 9/5 - Midline wound opened by Dr. Arnoldo Morale at bedside 8/24 and now has stool decompressing through CC fistula  - Currently has eakin's pouch. WOCN following. They d/c'd suction today given decreased output.  - Minimal output since 9/10 and  having bm's. Started on cld yesterday. Cont TPN.  Monitor strict I/O.  - Will discuss with  MD   FEN - CLD. TPN. IVF per primary VTE - SCDs ID - WBC normalized. Afebrile. None currently.    COVID + A. Fib on Xarelto at home  ABL anemia - FOBT positive 8/24. Monitor. Heparin currently held. Hgb 7.6 Hypothyroidism Severe protein calorie malnutrition - albumin 1.3. Pre-alb <5. On TNA   LOS: 19 days    Jillyn Ledger , [facility] Surgery 08/06/2021, 8:39 AM Please see Amion for pager number during day hours 7:00am-4:30pm

## 2021-08-06 NOTE — Progress Notes (Signed)
PROGRESS NOTE    Lauren [name]  FAO:[id number] DOB: 1946-01-22 DOA: 07/18/2021 PCP: Pcp, No   Brief Narrative:  The patient is a 75 year old morbidly obese Caucasian female with a past medical history significant for but not limited to atrial fibrillation, combined chronic systolic and diastolic CHF, hypertension, lymphedema as well as other comorbidities with recent admission to hospital for GI bleeding secondary to cecal mass status post right hemicolectomy on 07/06/2021 at outside hospital.  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  The patient was at [facility] facility and she is noted to be hypotensive complaint of nausea abdominal pain and back pain.  She is found to be COVID-positive.  She brought to the hospital and received IV fluids, vasopressors and antibiotics.  CT scan of the abdomen and pelvis showed fistula extending the bowel adjacent to the suture to an umbilical hernia into the abdominal wall fluid collection in the anterior abdominal wall and gas tracking.  Patient was admitted to the ICU on 07/18/2021 and started on vasopressors and antibiotics.  On 07/19/2001 patient was started on TPN taken off of Levophed.  She is subsequently transferred out of ICU and considered stable.  Hospitalization has been complicated by atrial fibrillation with RVR, new CHF as well as bleeding to left thigh resulting in acute blood loss anemia requiring blood transfusion.  Cardiology been consulted and general surgery is following.  Currently she is n.p.o. and continues to get TPN to allow healing.  General surgery a check on the patient today and have requested accurate output from her fistula which has still not being done properly.  We will need accurate stool output to determine if this is a low or high output fistula to further determine diet advancement.  Per general surgery the patient is okay for sips with meds and ice chips.  She was resumed on a Heparin  gtt on 08/03/21 and has had no S/Sx of Bleeding until today when she had bloody stools. Heparin gtt was stopped and will get FOBT and repeat CBC. After Discussion with General Surgery, and clear liquid diet was started yesterday and now surgery has DC'd the suction given her decreased output from her colocutaneous fistula.  Recommending continue TPN continuing liquid diet for now.   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  T3 N2 M0 status.  -CT scan showing some fistula with abdominal wall collection.   -Initially thought to be abscess but when the midline wound was opened stool was noted to be present. General surgery continues to follow.  Patient was placed on bowel rest and TPN was initiated.   -CT scan of the abdomen done 9/4 does not show any new fluid collection except for the left-sided thigh hematoma.  Extensive subcutaneous emphysema was noted as before and thought to be less compared to previous films.  -Remains afebrile.  WBC has been elevated though appears to be improving.   -Patient remained on IV Zosyn but after discussion with general surgery antibiotics were stopped -Remains n.p.o. currently and TPN being continued.  Dilaudid for pain control.  Electrolytes being addressed by  pharmacy. -General surgery has advanced the patient to sips with meds and chips but we will not recommend further diet advancement until we can determine if this is a high or low output fistula; Continued monitong and she has 50 mL out from her Enterocutaneous Fistula a few days ago and none now as per Nursing so after my discussion with  Dr. Kieth Brightly, he recommended advancing diet to CLD yesterday and general surgery recommends continuing clear liquid diet today and discontinuing the suction from her colocutaneous fistula given her decreased output  Colon Cancer/Cecal Mass -Right hemicolectomy on 07/06/21 (done at [facility]) for cecal mass with adenocarcinoma by pathology.  T3 N2 M0 status.  -Patient was seen by medical oncology (Dr. Chryl Heck) who recommended getting a CT chest to rule out pulmonary mets.   -She feels that if the CT chest did not show any evidence for metastatic process her prognosis could be reasonably good. -CT of the chest done and showed "Moderate left, small right pleural effusions and associated atelectasis or consolidation. Scattered ground-glass opacities throughout the lungs, nonspecific and infectious or inflammatory. Prominent pretracheal lymph nodes,  likely reactive although nonspecific. Metastatic disease not favored in the absence of other evidence." -We will need to discuss further with medical oncology   Acute Blood Loss Anemia  -Initial Drop in hemoglobin was due to hematoma in the left thigh.   -Unclear as to the reason behind the hematoma.   -No falls or injuries were reported.  Patient was aggressively transfused PRBCs.   -Has received 4 units so far.   -Hemoglobin responded appropriately.  -Repeat CBC showed stable hemoglobin/hematocrit of 8.2/27.1 -> 7.7/26.2 -> 8.4/28.2 -> 8.0/26.5 -> 7.7/26.0 and on repeat was 8.4/20.8 and trended down to 7.6/25.7 -Continue to monitor for signs and symptoms of bleeding; currently now having Bleeding from her Rectum -Hemoglobin dropped again a little bit and she had a large Ilic stoo yesterdayl with mostly dark red blood in it so we will check FOBT, repeat CBC and will discuss with Gastroenterology   Left thigh Hematoma/Pain in the left hip area -Noted to have hematoma in the left upper thigh muscles as well as possibly in the left psoas muscle.    -Pain medications as needed.  Will hold anticoagulation.   -Discussed with orthopedics (Dr. Ninfa Linden).  No role for surgical intervention currently.   -Anticoagulation resumed but will hold again today given bleeding from rectum   Permanent Atrial Fibrillation -Patient was on Xarelto as outpatient.   -Was switched over to Lovenox here in the hospital but now held   -Heart rate remained poorly controlled despite being on metoprolol initially and subsequently on Cardizem infusion.   -Subsequently cardiology was consulted due to poorly controlled heart rate and low blood pressures.   -Patient was subsequently started on amiodarone.   -Was given digoxin as well.  Heart rate is reasonably well controlled on IV Amiodarone now and Cardiology recommends no change today given that HR has been stable in the 90's-100's on Tele -Lovenox continues to be on hold but Since hemoglobin remained stable resumed anticoagulation with a heparin drip and this was done 08/03/21 but now held again yesterday due to her bleeding   Acute on Chronic Systolic CHF -Care everywhere was reviewed.   -She had an echocardiogram in August 2022 at [facility] which showed EF to be 30%. -Continue to monitor volume status.   -CT scan of the abdomen does raise concern for anasarca. -Cardiology is following.  No invasive work-up is  planned as of now due to her tenuous status. -Some concern for hypervolemia but will try some lasix and will give her IV Lasix 40 mg x1 for 4 days in a row but did not want yesterday and refused again today.  May consider giving it tomorrow given that she wanted to rest today. -Strict I's and O's and Daily weights; She was +18.355 Liters since Admission and is now + 15.065 -Weight is elevated and gone from 253 -> 272 -> 282   Septic shock  -Patient seems to be stable from a sepsis standpoint.  WBC is improved and trending down and went from 15.4 -> 14.2 -> 14.5 -> 12.2 -> 12.1 -> 9.7 -> 9.4 -> 9.0 x2 -Has been  off of vasopressors.  -Remained on IV Zosyn but now discontinued on 07/31/21.  Cultures have been negative.     Acute kidney injury -Resolved. -BUN/creatinine is now 23/0.49 -Avoid further nephrotoxic medications, contrast dyes, hypotension renally dose medications -Repeat CMP in a.m.   Acute Delirium -Appears to have improved.   -Continue Haldol as needed.    -Placed on delirium precautions   Incidental COVID 19 Positive. -Completed Remdesivir.   -On room air.  Patient tested positive on 8/24.  Now off of isolation.  Hypothyroidism -C/w Levothyroxine 50 mcg IV Daily    Hyponatremia/Hypokalemia/Hypomagnesemia -Sodium is stable.  Potassium, magnesium and phosphorus being addressed by pharmacy.  -K+ is 4.2, Mag is 2.1, and Phos is 3.3   Goals of Care -Palliative care met with the patient and spoke to patient's brother who is the next of kin.   -Patient does have a daughter but they are not in touch with each other.   -Patient was changed over to DNR/DNI based on this conversation.  GERD/GI Prophylaxis -C/w Pantoprazole 40 mg q12h   Morbid Obesity -Complicates overall prognosis and care -Estimated body mass index is 52.02 kg/m as calculated from the following:   Height as of this encounter: 5' 2"  (1.575 m).   Weight as of this encounter: 129 kg. -Weight Loss and Dietary Counseling given  -Nutritionist recommending continue TPN per pharmacy management  DVT prophylaxis: SCDs Code Status: DO NOT RESUSCITATE  Family Communication: No family present at bedside  Disposition Plan: None  Status is: Inpatient  Remains inpatient appropriate because:Unsafe d/c plan, IV treatments appropriate due to intensity of illness or inability to take PO, and Inpatient level of care appropriate due to severity of illness  Dispo: The patient is from: Home              Anticipated d/c is to: SNF              Patient currently is not medically stable to d/c.   Difficult to place patient  No  Consultants:  PCCM Transfer Cardiology  General Surgery Palliative Care Medicine   Procedures: PICC Line   Antimicrobials:  Anti-infectives (From admission, onward)    Start     Dose/Rate Route Frequency Ordered Stop   07/20/21 1000  remdesivir 100 mg in sodium chloride 0.9 % 100 mL IVPB       See Hyperspace for full Linked Orders Report.   100 mg 200 mL/hr over 30 Minutes Intravenous Daily 07/19/21 1040 07/21/21 1148   07/20/21 0200  vancomycin (VANCOREADY) IVPB 750 mg/150 mL  Status:  Discontinued        750 mg 150 mL/hr over 60 Minutes Intravenous Every 24 hours 07/19/21 1036 07/19/21 1449   07/19/21 2300  anidulafungin (ERAXIS)  100 mg in sodium chloride 0.9 % 100 mL IVPB  Status:  Discontinued        100 mg 78 mL/hr over 100 Minutes Intravenous Every 24 hours 07/18/21 2107 07/19/21 1449   07/19/21 1600  vancomycin (VANCOREADY) IVPB 750 mg/150 mL  Status:  Discontinued        750 mg 150 mL/hr over 60 Minutes Intravenous Every 24 hours 07/19/21 0919 07/19/21 1016   07/19/21 1600  vancomycin (VANCOREADY) IVPB 750 mg/150 mL  Status:  Discontinued        750 mg 150 mL/hr over 60 Minutes Intravenous Every 24 hours 07/19/21 1016 07/19/21 1036   07/19/21 1400  piperacillin-tazobactam (ZOSYN) IVPB 3.375 g  Status:  Discontinued        3.375 g 12.5 mL/hr over 240 Minutes Intravenous Every 8 hours 07/19/21 1016 07/31/21 1501   07/19/21 1130  remdesivir 200 mg in sodium chloride 0.9% 250 mL IVPB       See Hyperspace for full Linked Orders Report.   200 mg 580 mL/hr over 30 Minutes Intravenous Once 07/19/21 1040 07/19/21 1241   07/19/21 0100  metroNIDAZOLE (FLAGYL) IVPB 500 mg  Status:  Discontinued        500 mg 100 mL/hr over 60 Minutes Intravenous Every 12 hours 07/18/21 1203 07/18/21 1534   07/18/21 2200  ceFEPIme (MAXIPIME) 2 g in sodium chloride 0.9 % 100 mL IVPB  Status:  Discontinued        2 g 200 mL/hr over 30 Minutes Intravenous Every 12 hours 07/18/21 1156 07/18/21  1203   07/18/21 2200  meropenem (MERREM) 2 g in sodium chloride 0.9 % 100 mL IVPB  Status:  Discontinued        2 g 200 mL/hr over 30 Minutes Intravenous Every 12 hours 07/18/21 2104 07/19/21 1011   07/18/21 2200  anidulafungin (ERAXIS) 200 mg in sodium chloride 0.9 % 200 mL IVPB        200 mg 78 mL/hr over 200 Minutes Intravenous  Once 07/18/21 2108 07/19/21 0419   07/18/21 1800  clindamycin (CLEOCIN) IVPB 900 mg  Status:  Discontinued        900 mg 100 mL/hr over 30 Minutes Intravenous Every 8 hours 07/18/21 1534 07/18/21 2103   07/18/21 1600  vancomycin (VANCOREADY) IVPB 2000 mg/400 mL  Status:  Discontinued        2,000 mg 200 mL/hr over 120 Minutes Intravenous Every 24 hours 07/18/21 1544 07/19/21 0919   07/18/21 1545  piperacillin-tazobactam (ZOSYN) IVPB 3.375 g  Status:  Discontinued        3.375 g 12.5 mL/hr over 240 Minutes Intravenous Every 8 hours 07/18/21 1544 07/18/21 2104   07/18/21 1300  cefTRIAXone (ROCEPHIN) 2 g in sodium chloride 0.9 % 100 mL IVPB  Status:  Discontinued        2 g 200 mL/hr over 30 Minutes Intravenous Every 24 hours 07/18/21 1203 07/18/21 1628   07/18/21 1200  ceFEPIme (MAXIPIME) 2 g in sodium chloride 0.9 % 100 mL IVPB  Status:  Discontinued        2 g 200 mL/hr over 30 Minutes Intravenous Every 12 hours 07/18/21 1155 07/18/21 1156   07/18/21 0930  ceFEPIme (MAXIPIME) 2 g in sodium chloride 0.9 % 100 mL IVPB        2 g 200 mL/hr over 30 Minutes Intravenous  Once 07/18/21 0918 07/18/21 1135   07/18/21 0930  metroNIDAZOLE (FLAGYL) IVPB 500 mg  Status:  Discontinued  500 mg 100 mL/hr over 60 Minutes Intravenous  Once 07/18/21 3810 07/18/21 1534        Subjective: Seen and examined 70s and she states that she is doing little bit better.  No chest pain or shortness of breath.  Feels okay but wants to rest.  No nausea or vomiting.  Had a large bloody bowel movement yesterday but has not had any bowel movements today.  No other concerns or  complaints at this time.  Objective: Vitals:   08/05/21 1149 08/05/21 2022 08/06/21 0100 08/06/21 0800  BP: (!) 120/50 133/76 (!) 140/48 (!) 100/37  Pulse:  96  90  Resp:  18  18  Temp: 98.3 F (36.8 C) 97.8 F (36.6 C)  98.6 F (37 C)  TempSrc: Oral Oral  Oral  SpO2:    97%  Weight:      Height:        Intake/Output Summary (Last 24 hours) at 08/06/2021 1630 Last data filed at 08/06/2021 1302 Gross per 24 hour  Intake 1451.53 ml  Output 1400 ml  Net 51.53 ml    Filed Weights   07/26/21 0340 07/26/21 1333 08/05/21 0427  Weight: 126.8 kg 123.7 kg 129 kg   Examination: Physical Exam:  Constitutional: WN/WD morbidly obese Caucasian female currently in no acute distress wanting to rest  Eyes: Lids and conjunctivae normal, sclerae anicteric  ENMT: External Ears, Nose appear normal. Grossly normal hearing.  Neck: Appears normal, supple, no cervical masses, normal ROM, no appreciable thyromegaly Respiratory: Diminished to auscultation bilaterally with coarse breath sounds, no wheezing, rales, rhonchi or crackles. Normal respiratory effort and patient is not tachypenic. No accessory muscle use.  Unlabored breathing Cardiovascular: Irregularly irregular and slightly tachycardic, no murmurs / rubs / gallops. S1 and S2 auscultated.  Has lower extremity nonpitting is slightly pitting edema in the ankle with lymphedema and elephantiasis worse on the left compared to the right Abdomen: Soft, non-tender, distended secondary body habitus with surgical changes noted and she has an Eakin's pouch from a colocutaneous fistula with very small amount of feculent material.  The pouch is no longer connected to suction. No masses palpated. No appreciable hepatosplenomegaly. Bowel sounds positive.  GU: Deferred. Musculoskeletal: No clubbing / cyanosis of digits/nails. No joint deformity upper and lower extremities.  Skin: No rashes, lesions, ulcers on limited skin evaluation. No induration; Warm and  dry.  Neurologic: CN 2-12 grossly intact with no focal deficits. Romberg sign cerebellar reflexes not assessed.  Psychiatric: Normal judgment and insight. Alert and oriented x 3. Normal mood and appropriate affect.   Data Reviewed: I have personally reviewed following labs and imaging studies  CBC: Recent Labs  Lab 08/03/21 1011 08/04/21 0545 08/05/21 0438 08/05/21 1940 08/06/21 0419  WBC 9.4 9.0 9.0 9.4 8.9  NEUTROABS 6.7 6.8 5.3 5.7 5.5  HGB 8.4* 8.0* 7.7* 8.4* 7.6*  HCT 28.2* 26.5* 26.0* 28.8* 25.7*  MCV 90.4 90.1 90.6 91.7 92.1  PLT 187 183 202 244 175    Basic Metabolic Panel: Recent Labs  Lab 08/02/21 0938 08/03/21 1011 08/04/21 0600 08/05/21 0438 08/06/21 0419  NA 141 138 140 140 138  K 4.0 4.1 3.9 4.1 4.2  CL 110 109 107 110 107  CO2 28 26 29 27 26   GLUCOSE 93 110* 74 80 91  BUN 25* 25* 25* 25* 23  CREATININE 0.48 0.47 0.54 0.50 0.49  CALCIUM 7.7* 7.8* 7.9* 7.9* 7.9*  MG 2.0 1.9 1.9 2.0 2.1  PHOS 3.4 3.5 3.3  3.1 3.3    GFR: Estimated Creatinine Clearance: 82 mL/min (by C-G formula based on SCr of 0.49 mg/dL). Liver Function Tests: Recent Labs  Lab 08/02/21 0938 08/03/21 1011 08/04/21 0600 08/05/21 0438 08/06/21 0419  AST 35 41 38 35 40  ALT 23 26 25 23 27   ALKPHOS 229* 238* 210* 172* 186*  BILITOT 0.9 1.0 0.9 0.9 0.7  PROT 5.5* 6.1* 5.5* 5.6* 5.5*  ALBUMIN 1.2* 1.4* 1.2* 1.3* 1.3*    No results for input(s): LIPASE, AMYLASE in the last 168 hours. No results for input(s): AMMONIA in the last 168 hours. Coagulation Profile: No results for input(s): INR, PROTIME in the last 168 hours. Cardiac Enzymes: No results for input(s): CKTOTAL, CKMB, CKMBINDEX, TROPONINI in the last 168 hours. BNP (last 3 results) No results for input(s): PROBNP in the last 8760 hours. HbA1C: No results for input(s): HGBA1C in the last 72 hours. CBG: No results for input(s): GLUCAP in the last 168 hours. Lipid Profile: Recent Labs    08/06/21 0417  TRIG 92     Thyroid Function Tests: No results for input(s): TSH, T4TOTAL, FREET4, T3FREE, THYROIDAB in the last 72 hours. Anemia Panel: No results for input(s): VITAMINB12, FOLATE, FERRITIN, TIBC, IRON, RETICCTPCT in the last 72 hours. Sepsis Labs: No results for input(s): PROCALCITON, LATICACIDVEN in the last 168 hours.   No results found for this or any previous visit (from the past 240 hour(s)).   RN Pressure Injury Documentation: Pressure Injury 07/18/21 Coccyx (Active)  07/18/21 2100  Location: Coccyx  Location Orientation:   Staging:   Wound Description (Comments):   Present on Admission:      Pressure Injury 07/18/21 Thigh Posterior;Proximal;Right (Active)  07/18/21 2100  Location: Thigh  Location Orientation: Posterior;Proximal;Right  Staging:   Wound Description (Comments):   Present on Admission:      Estimated body mass index is 52.02 kg/m as calculated from the following:   Height as of this encounter: 5' 2"  (1.575 m).   Weight as of this encounter: 129 kg.  Malnutrition Type:  Nutrition Problem: Inadequate oral intake Etiology: altered GI function, acute illness  Malnutrition Characteristics:  Signs/Symptoms: NPO status  Nutrition Interventions:  Interventions: TPN   Radiology Studies: No results found.  Scheduled Meds:  Chlorhexidine Gluconate Cloth  6 each Topical Daily   levothyroxine  50 mcg Intravenous Daily   mouth rinse  15 mL Mouth Rinse BID   pantoprazole (PROTONIX) IV  40 mg Intravenous Q12H   sodium chloride flush  10-40 mL Intracatheter Q12H   Continuous Infusions:  amiodarone 30 mg/hr (08/06/21 1552)   methocarbamol (ROBAXIN) IV 500 mg (07/25/21 2343)   TPN ADULT (ION) 105 mL/hr at 08/05/21 1731   TPN ADULT (ION)      LOS: 19 days   Kerney Elbe, DO [facility] PAGER is on AMION  If 7PM-7AM, please contact night-coverage [email or web address]

## 2021-08-06 NOTE — Plan of Care (Signed)
  Problem: Respiratory: Goal: Ability to maintain adequate ventilation will improve Outcome: Progressing   Problem: Education: Goal: Knowledge of General Education information will improve Description: Including pain rating scale, medication(s)/side effects and non-pharmacologic comfort measures Outcome: Progressing   Problem: Health Behavior/Discharge Planning: Goal: Ability to manage health-related needs will improve Outcome: Progressing   Problem: Clinical Measurements: Goal: Ability to maintain clinical measurements within normal limits will improve Outcome: Progressing   Problem: Activity: Goal: Risk for activity intolerance will decrease Outcome: Progressing   Problem: Nutrition: Goal: Adequate nutrition will be maintained Outcome: Progressing   Problem: Coping: Goal: Level of anxiety will decrease Outcome: Progressing

## 2021-08-06 NOTE — Progress Notes (Signed)
PHARMACY - TOTAL PARENTERAL NUTRITION CONSULT NOTE  Indication: Fistula  Patient Measurements: Height: _0  (157.5 cm) Weight: 129 kg (284 lb 6.3 oz) IBW/kg (Calculated) : 50.1 TPN AdjBW (KG): 66.6 Body mass index is 52.02 kg/m.  Assessment:  42 YOF with recent R-hemicolectomy on 8/12 in the setting of a cecal mass - consistent with R-colon metastatic adenocarcinoma. 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 Pharmacy consulted to manage TPN for nutritional support in the setting of a colocutaneous fistula.    Glucose / Insulin: CBGs controlled on low end, SSI d/c'd 8/28 Electrolytes: all WNL - K 4.2 (goal >/=4 for Afib), Mag 2.1 (goal >/= 2 for Afib) Renal:  SCr < 1, BUN WNL Hepatic: LFTs / tibili / TG WNL, alk phos stable 186, albumin 1.3 Intake / Output; MIVF: no drain output charted, UOP 0.71m/kg/hr per documentation (appears inaccurate) GI Imaging: 8/24 Abd CT: colocutaneous fistula, 7.4cm abd wall abscess, soft tissue edema, cellulitis vs nec fasciitis 8/25 Abd xray: diffuse abd wall emphysema, nonobstructive bowel gas pattern 9/4 CT abd/pelvis: medial upper left thigh hematoma GI Surgeries / Procedures: 8/12 (last admit): R-hemicolectomy  Central access: PICC replaced 07/27/21 TPN start date: 07/19/21  Nutritional Goals (RD assessment on 8/25): 2300-2500 kcal, 140-160g AA, fluid >/= 2L per day  Current Nutrition:  TPN - continue full TPN today per discussion with Surgery ([place]) CLD started 9/11  Plan:  Continue TPN at goal rate of 105 mL/hr, providing 151g AA and 2373 kCal meeting 100% of estimated needs Electrolytes in TPN: Na 125 mEq/L, K 45 mEq/L, Ca 47m/L, Mg 1352mL, Phos 7m34mL, Cl:Ac 1:1 Add standard MVI and trace elements to TPN Monitor standard TPN labs on Monday/Thursday F/u Surgery plans for diet advancement and ability to wean TPN   HaleArturo MortonarmD, BCPS Please check AMION for all [facility]  [place] numbers Clinical Pharmacist 08/06/2021 8:36 AM

## 2021-08-07 DIAGNOSIS — A419 Sepsis, unspecified organism: Secondary | ICD-10-CM | POA: Diagnosis not present

## 2021-08-07 DIAGNOSIS — C189 Malignant neoplasm of colon, unspecified: Secondary | ICD-10-CM | POA: Diagnosis not present

## 2021-08-07 DIAGNOSIS — N179 Acute kidney failure, unspecified: Secondary | ICD-10-CM | POA: Diagnosis not present

## 2021-08-07 DIAGNOSIS — L03311 Cellulitis of abdominal wall: Secondary | ICD-10-CM | POA: Diagnosis not present

## 2021-08-07 DIAGNOSIS — R6521 Severe sepsis with septic shock: Secondary | ICD-10-CM | POA: Diagnosis not present

## 2021-08-07 LAB — CBC WITH DIFFERENTIAL/PLATELET
Abs Immature Granulocytes: 0 10*3/uL (ref 0.00–0.07)
Basophils Absolute: 0.2 10*3/uL — ABNORMAL HIGH (ref 0.0–0.1)
Basophils Relative: 2 %
Eosinophils Absolute: 0.9 10*3/uL — ABNORMAL HIGH (ref 0.0–0.5)
Eosinophils Relative: 9 %
HCT: 27.2 % — ABNORMAL LOW (ref 36.0–46.0)
Hemoglobin: 8 g/dL — ABNORMAL LOW (ref 12.0–15.0)
Lymphocytes Relative: 11 %
Lymphs Abs: 1.1 10*3/uL (ref 0.7–4.0)
MCH: 27.5 pg (ref 26.0–34.0)
MCHC: 29.4 g/dL — ABNORMAL LOW (ref 30.0–36.0)
MCV: 93.5 fL (ref 80.0–100.0)
Monocytes Absolute: 0.4 10*3/uL (ref 0.1–1.0)
Monocytes Relative: 4 %
Neutro Abs: 7.7 10*3/uL (ref 1.7–7.7)
Neutrophils Relative %: 74 %
Platelets: 236 10*3/uL (ref 150–400)
RBC: 2.91 MIL/uL — ABNORMAL LOW (ref 3.87–5.11)
RDW: 24.3 % — ABNORMAL HIGH (ref 11.5–15.5)
WBC: 10.4 10*3/uL (ref 4.0–10.5)
nRBC: 0 /100 WBC
nRBC: 0.4 % — ABNORMAL HIGH (ref 0.0–0.2)

## 2021-08-07 LAB — COMPREHENSIVE METABOLIC PANEL
ALT: 32 U/L (ref 0–44)
AST: 45 U/L — ABNORMAL HIGH (ref 15–41)
Albumin: 1.4 g/dL — ABNORMAL LOW (ref 3.5–5.0)
Alkaline Phosphatase: 167 U/L — ABNORMAL HIGH (ref 38–126)
Anion gap: 4 — ABNORMAL LOW (ref 5–15)
BUN: 22 mg/dL (ref 8–23)
CO2: 27 mmol/L (ref 22–32)
Calcium: 7.7 mg/dL — ABNORMAL LOW (ref 8.9–10.3)
Chloride: 102 mmol/L (ref 98–111)
Creatinine, Ser: 0.49 mg/dL (ref 0.44–1.00)
GFR, Estimated: >60 mL/min (ref >60)
Glucose, Bld: 94 mg/dL (ref 70–99)
Potassium: 4.5 mmol/L (ref 3.5–5.1)
Sodium: 133 mmol/L — ABNORMAL LOW (ref 135–145)
Total Bilirubin: 1 mg/dL (ref 0.3–1.2)
Total Protein: 5.9 g/dL — ABNORMAL LOW (ref 6.5–8.1)

## 2021-08-07 LAB — PHOSPHORUS: Phosphorus: 3.2 mg/dL (ref 2.5–4.6)

## 2021-08-07 LAB — MAGNESIUM: Magnesium: 1.9 mg/dL (ref 1.7–2.4)

## 2021-08-07 LAB — SURGICAL PATHOLOGY

## 2021-08-07 MED ORDER — PANTOPRAZOLE SODIUM 40 MG PO TBEC
40.0000 mg | DELAYED_RELEASE_TABLET | Freq: Every day | ORAL | Status: DC
  Administered 2021-08-08 – 2021-08-25 (×17): 40 mg via ORAL
  Filled 2021-08-07 (×18): qty 1

## 2021-08-07 MED ORDER — TRAVASOL 10 % IV SOLN
INTRAVENOUS | Status: AC
  Filled 2021-08-07: qty 1512

## 2021-08-07 MED ORDER — ENSURE ENLIVE PO LIQD
237.0000 mL | Freq: Two times a day (BID) | ORAL | Status: DC
  Administered 2021-08-07 – 2021-08-14 (×11): 237 mL via ORAL
  Filled 2021-08-07 (×2): qty 237

## 2021-08-07 NOTE — TOC Progression Note (Signed)
Transition of Care [facility]) - Progression Note    Patient Details  Name: Lauren [name] MRN: [id number] Date of Birth: 20-Sep-1946  Transition of Care [facility]) CM/SW Contact  Joanne Chars, LCSW Phone Number: 08/07/2021, 1:00 PM  Clinical Narrative:   CSW spoke with pt in room, pt still worried about her house payment, unable to find her bank phone number in her [place], [place] helped her find phone number for [employer].  Discussed DC plan, pt now saying she is willing to go to rehab, "as long as I can leave when I want to."  Pt does not want to return to [place], asked about facility in [place]--unclear which SNF, pt agrees to have referral sent to both SNF in [place], [place] and [facility].    Expected Discharge Plan: [place] Barriers to Discharge: Continued Medical Work up, SNF Pending bed offer  Expected Discharge Plan and Services Expected Discharge Plan: [place] arrangements for the past 2 months: Single Family Home                                       Social Determinants of Health (SDOH) Interventions    Readmission Risk Interventions Readmission Risk Prevention Plan 07/12/2021  Transportation Screening Complete  Home Care Screening Complete  Medication Review (RN CM) Complete  Some recent data might be hidden

## 2021-08-07 NOTE — Progress Notes (Signed)
OT Cancellation Note  Patient Details Name: MUSETTE SIMENTAL MRN: [id number] DOB: May 06, 1946   Cancelled Treatment:    Reason Eval/Treat Not Completed: Patient declined, no reason specified.  Patient stated that she had personal issues that had to be taken care of today and that therapy was not a good day for today.  Lodema Hong, OTA  Jerra Huckeby Alexis Goodell 08/07/2021, 11:13 AM

## 2021-08-07 NOTE — Progress Notes (Signed)
PROGRESS NOTE    Lauren [name]  DTO:[id number] DOB: August 01, 1946 DOA: 07/18/2021 PCP: Pcp, No   Brief Narrative:  The patient is a 75 year old morbidly obese Caucasian female with a past medical history significant for but not limited to atrial fibrillation, combined chronic systolic and diastolic CHF, hypertension, lymphedema as well as other comorbidities with recent admission to hospital for GI bleeding secondary to cecal mass status post right hemicolectomy on 07/06/2021 at outside hospital.  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  The patient was at [facility] facility and she is noted to be hypotensive complaint of nausea abdominal pain and back pain.  She is found to be COVID-positive.  She brought to the hospital and received IV fluids, vasopressors and antibiotics.  CT scan of the abdomen and pelvis showed fistula extending the bowel adjacent to the suture to an umbilical hernia into the abdominal wall fluid collection in the anterior abdominal wall and gas tracking.  Patient was admitted to the ICU on 07/18/2021 and started on vasopressors and antibiotics.  On 07/19/2001 patient was started on TPN taken off of Levophed.  She is subsequently transferred out of ICU and considered stable.  Hospitalization has been complicated by atrial fibrillation with RVR, new CHF as well as bleeding to left thigh resulting in acute blood loss anemia requiring blood transfusion.  Cardiology been consulted and general surgery is following.  Currently she is n.p.o. and continues to get TPN to allow healing.  General surgery a check on the patient today and have requested accurate output from her fistula which has still not being done properly.  We will need accurate stool output to determine if this is a low or high output fistula to further determine diet advancement.  Per general surgery the patient is okay for sips with meds and ice chips.  She was resumed on a Heparin  gtt on 08/03/21 and has had no S/Sx of Bleeding until a few days ago when she had bloody stools. Heparin gtt was stopped and will get FOBT and repeat CBC. GI consulted for further evaluation and they felt that the bleeding per rectum could have come from hemorrhoids, diverticulosis or surgical anastomosis and they do not recommend any current interventions given that her hemoglobin is relatively stable but they are recommending outpatient colonoscopy.  After Discussion with General Surgery, and clear liquid diet was started yesterday and now surgery has DC'd the suction given her decreased output from her colocutaneous fistula.  Recommending continue TPN continuing liquid diet for now and surgery considering advancing diet to full liquid diet.   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  T3 N2 M0 status.  -CT scan showing some fistula with abdominal wall collection.   -Initially thought to be abscess but when the midline wound was opened stool was noted to be present. General surgery continues to follow.  Patient was placed on bowel rest and TPN was initiated.   -CT scan of the abdomen done 9/4 does not show any new fluid collection except for the left-sided thigh hematoma.  Extensive subcutaneous emphysema was noted as  before and thought to be less compared to previous films.  -Remains afebrile.  WBC has been elevated though appears to be improving.   -Patient remained on IV Zosyn but after discussion with general surgery antibiotics were stopped -Remains n.p.o. currently and TPN being continued.  Dilaudid for pain control.   Electrolytes being addressed by pharmacy. -General surgery has advanced the patient to sips with meds and chips but we will not recommend further diet advancement until we can determine if this is a high or low output fistula; Continued monitong and she has 50 mL out from her Enterocutaneous Fistula a few days ago and none now as per Nursing so after my discussion with Dr. Kieth Brightly, he recommended advancing diet to CLD yesterday and general surgery recommends continuing clear liquid diet and possibly advancing to FLD and discontinuing the suction from her colocutaneous fistula given her decreased output  Colon Cancer/Cecal Mass -Right hemicolectomy on 07/06/21 (done at [facility]) for cecal mass with adenocarcinoma by pathology.  T3 N2 M0 status.  -Patient was seen by medical oncology (Dr. Chryl Heck) who recommended getting a CT chest to rule out pulmonary mets.   -She feels that if the CT chest did not show any evidence for metastatic process her prognosis could be reasonably good. -CT of the chest done and showed "Moderate left, small right pleural effusions and associated atelectasis or consolidation. Scattered ground-glass opacities throughout the lungs, nonspecific and infectious or inflammatory. Prominent pretracheal lymph nodes,  likely reactive although nonspecific. Metastatic disease not favored in the absence of other evidence." -We will need to discuss further with medical oncology   Acute Blood Loss Anemia  -Initial Drop in hemoglobin was due to hematoma in the left thigh.   -Unclear as to the reason behind the hematoma.   -No falls or injuries were reported.  Patient was aggressively transfused PRBCs.   -Has received 4 units so far.   -Hemoglobin responded appropriately.  -Repeat CBC showed stable hemoglobin/hematocrit of 8.2/27.1 -> 7.7/26.2 -> 8.4/28.2 -> 8.0/26.5 -> 7.7/26.0 and on repeat was 8.4/20.8 and trended down to 7.6/25.7 and is now 8.0/27.2 -Continue to monitor for  signs and symptoms of bleeding; currently had only one episode of Hematochezia  -GI evaluated and recommending no further current interventions but they do recommend an outpatient colonoscopy; per Dr. Lorenso Courier she may benefit from an outpatient colonoscopy since her last colonoscopy did not have optimal prep but if the patient is to develop more substantial bleeding from her colocutaneous fistula pouch or rectal bleeding with hemoglobin drop we will reconsider scoping the patient as inpatient -If hemoglobin remains stable tomorrow we may consider resuming heparin drip   Left thigh Hematoma/Pain in the left hip area -Noted to have hematoma in the left upper thigh muscles as well as possibly in the left psoas muscle.   -Pain medications as needed.  Will hold anticoagulation.   -Discussed with orthopedics (Dr. Ninfa Linden).  No role for surgical intervention currently.   -Anticoagulation resumed but will hold again today given bleeding from rectum  Left wrist infiltrated IV -Needs warm compresses -Continue to monitor closely and if necessary we will start her on antibiotics if continues to worsen   Permanent Atrial Fibrillation -Patient was on Xarelto as outpatient.   -Was switched over to Lovenox here in the hospital but now held   -Heart rate remained poorly controlled despite being on metoprolol initially and subsequently on Cardizem infusion.   -Subsequently cardiology was consulted due to poorly controlled heart rate  and low blood pressures.   -Patient was subsequently started on amiodarone.   -Was given digoxin as well.  Heart rate is reasonably well controlled on IV Amiodarone now and Cardiology recommends no change today given that HR has been stable in the 90's-100's on Tele -Lovenox continues to be on hold but Since hemoglobin remained stable resumed anticoagulation with a heparin drip and this was done 08/03/21 but now held again the day before yesterday due to her bleeding but will consider  resuming in the AM    Acute on Chronic Systolic CHF -Care everywhere was reviewed.   -She had an echocardiogram in August 2022 at [facility] which showed EF to be 30%. -Continue to monitor volume status.   -CT scan of the abdomen does raise concern for anasarca. -Cardiology is following.  No invasive work-up is planned as of now due to her tenuous status. -Some concern for hypervolemia but will try some lasix and she was given IV Lasix 40 mg x1 for 4 days in a row but held the last few days days.  May consider giving it tomorrow -Strict I's and O's and Daily weights; She was +18.355 Liters since Admission and is now + 14.341 Liters -Weight is elevated and gone from 253 -> 272 -> 282   Septic shock  -Patient seems to be stable from a sepsis standpoint.  WBC is improved and trending down and went from 15.4 -> 14.2 -> 14.5 -> 12.2 -> 12.1 -> 9.7 -> 9.4 -> 9.0 x2 and wernt to 9.4 -> 8.9 -> 10.3 -Has been off of vasopressors.  -Remained on IV Zosyn but now discontinued on 07/31/21.  Cultures have been negative.     Acute kidney injury -Resolved. -BUN/creatinine is now 22/0.49 -Avoid further nephrotoxic medications, contrast dyes, hypotension renally dose medications -Repeat CMP in a.m.   Acute Delirium -Appears to have improved.   -Continue Haldol as needed.    -Placed on delirium precautions   Incidental COVID 19 Positive. -Completed Remdesivir.   -On room air.  Patient tested positive on 8/24.  Now off of isolation.  Hypothyroidism -C/w Levothyroxine 50 mcg IV Daily    Hyponatremia/Hypokalemia/Hypomagnesemia -Sodium is stable.  Potassium, magnesium and phosphorus being addressed by pharmacy.  -K+ is 4.5, Mag is 1.9, and Phos is 3.2   Goals of Care -Palliative care met with the patient and spoke to patient's brother who is the next of kin.   -Patient does have a daughter but they are not in touch with each other.   -Patient was changed over to DNR/DNI based on this  conversation.  GERD/GI Prophylaxis -C/w Pantoprazole 40 mg q12h   Morbid Obesity -Complicates overall prognosis and care -Estimated body mass index is 54.64 kg/m as calculated from the following:   Height as of this encounter: 5' 2"  (1.575 m).   Weight as of this encounter: 135.5 kg. -Weight Loss and Dietary Counseling given  -Nutritionist recommending continue TPN per pharmacy management  DVT prophylaxis: SCDs Code Status: DO NOT RESUSCITATE  Family Communication: No family present at bedside  Disposition Plan: None  Status is: Inpatient  Remains inpatient appropriate because:Unsafe d/c plan, IV treatments appropriate due to intensity of illness or inability to take PO, and Inpatient level of care appropriate due to severity of illness  Dispo: The patient is from: Home              Anticipated d/c is to: SNF  Patient currently is not medically stable to d/c.   Difficult to place patient No  Consultants:  PCCM Transfer Cardiology  General Surgery Palliative Care Medicine  Gastroenterology   Procedures: PICC Line   Antimicrobials:  Anti-infectives (From admission, onward)    Start     Dose/Rate Route Frequency Ordered Stop   07/20/21 1000  remdesivir 100 mg in sodium chloride 0.9 % 100 mL IVPB       See Hyperspace for full Linked Orders Report.   100 mg 200 mL/hr over 30 Minutes Intravenous Daily 07/19/21 1040 07/21/21 1148   07/20/21 0200  vancomycin (VANCOREADY) IVPB 750 mg/150 mL  Status:  Discontinued        750 mg 150 mL/hr over 60 Minutes Intravenous Every 24 hours 07/19/21 1036 07/19/21 1449   07/19/21 2300  anidulafungin (ERAXIS) 100 mg in sodium chloride 0.9 % 100 mL IVPB  Status:  Discontinued        100 mg 78 mL/hr over 100 Minutes Intravenous Every 24 hours 07/18/21 2107 07/19/21 1449   07/19/21 1600  vancomycin (VANCOREADY) IVPB 750 mg/150 mL  Status:  Discontinued        750 mg 150 mL/hr over 60 Minutes Intravenous Every 24 hours 07/19/21  0919 07/19/21 1016   07/19/21 1600  vancomycin (VANCOREADY) IVPB 750 mg/150 mL  Status:  Discontinued        750 mg 150 mL/hr over 60 Minutes Intravenous Every 24 hours 07/19/21 1016 07/19/21 1036   07/19/21 1400  piperacillin-tazobactam (ZOSYN) IVPB 3.375 g  Status:  Discontinued        3.375 g 12.5 mL/hr over 240 Minutes Intravenous Every 8 hours 07/19/21 1016 07/31/21 1501   07/19/21 1130  remdesivir 200 mg in sodium chloride 0.9% 250 mL IVPB       See Hyperspace for full Linked Orders Report.   200 mg 580 mL/hr over 30 Minutes Intravenous Once 07/19/21 1040 07/19/21 1241   07/19/21 0100  metroNIDAZOLE (FLAGYL) IVPB 500 mg  Status:  Discontinued        500 mg 100 mL/hr over 60 Minutes Intravenous Every 12 hours 07/18/21 1203 07/18/21 1534   07/18/21 2200  ceFEPIme (MAXIPIME) 2 g in sodium chloride 0.9 % 100 mL IVPB  Status:  Discontinued        2 g 200 mL/hr over 30 Minutes Intravenous Every 12 hours 07/18/21 1156 07/18/21 1203   07/18/21 2200  meropenem (MERREM) 2 g in sodium chloride 0.9 % 100 mL IVPB  Status:  Discontinued        2 g 200 mL/hr over 30 Minutes Intravenous Every 12 hours 07/18/21 2104 07/19/21 1011   07/18/21 2200  anidulafungin (ERAXIS) 200 mg in sodium chloride 0.9 % 200 mL IVPB        200 mg 78 mL/hr over 200 Minutes Intravenous  Once 07/18/21 2108 07/19/21 0419   07/18/21 1800  clindamycin (CLEOCIN) IVPB 900 mg  Status:  Discontinued        900 mg 100 mL/hr over 30 Minutes Intravenous Every 8 hours 07/18/21 1534 07/18/21 2103   07/18/21 1600  vancomycin (VANCOREADY) IVPB 2000 mg/400 mL  Status:  Discontinued        2,000 mg 200 mL/hr over 120 Minutes Intravenous Every 24 hours 07/18/21 1544 07/19/21 0919   07/18/21 1545  piperacillin-tazobactam (ZOSYN) IVPB 3.375 g  Status:  Discontinued        3.375 g 12.5 mL/hr over 240 Minutes Intravenous Every 8 hours 07/18/21 1544  07/18/21 2104   07/18/21 1300  cefTRIAXone (ROCEPHIN) 2 g in sodium chloride 0.9 % 100 mL  IVPB  Status:  Discontinued        2 g 200 mL/hr over 30 Minutes Intravenous Every 24 hours 07/18/21 1203 07/18/21 1628   07/18/21 1200  ceFEPIme (MAXIPIME) 2 g in sodium chloride 0.9 % 100 mL IVPB  Status:  Discontinued        2 g 200 mL/hr over 30 Minutes Intravenous Every 12 hours 07/18/21 1155 07/18/21 1156   07/18/21 0930  ceFEPIme (MAXIPIME) 2 g in sodium chloride 0.9 % 100 mL IVPB        2 g 200 mL/hr over 30 Minutes Intravenous  Once 07/18/21 0918 07/18/21 1135   07/18/21 0930  metroNIDAZOLE (FLAGYL) IVPB 500 mg  Status:  Discontinued        500 mg 100 mL/hr over 60 Minutes Intravenous  Once 07/18/21 6629 07/18/21 1534        Subjective: Seen and examined at bedside and she was a little furstrated. Stated she wanted to rest. Still very weak but no further bleeding. Had a Rectal Exam today and did not like that and afterwards felt nauseous and did not want to eat.  Life stressors going on as well with her family and was a little tearful.  No other concerns or complaints at this time.  Objective: Vitals:   08/07/21 0325 08/07/21 0430 08/07/21 0833 08/07/21 1148  BP: 110/68  (!) 111/48 103/62  Pulse: 94  92 100  Resp: 16  18 18   Temp: 98 F (36.7 C)  98.2 F (36.8 C) 98.6 F (37 C)  TempSrc: Oral  Oral Oral  SpO2: 97%  98% 98%  Weight:  135.5 kg    Height:        Intake/Output Summary (Last 24 hours) at 08/07/2021 1506 Last data filed at 08/07/2021 1401 Gross per 24 hour  Intake 976.65 ml  Output 505 ml  Net 471.65 ml    Filed Weights   07/26/21 1333 08/05/21 0427 08/07/21 0430  Weight: 123.7 kg 129 kg 135.5 kg   Examination: Physical Exam:  Constitutional: WN/WD morbidly obese Caucasian female currently tearful and upset and states that she has a lot going on Eyes: Lids and conjunctivae normal, sclerae anicteric  ENMT: External Ears, Nose appear normal. Grossly normal hearing. Mucous membranes are moist. Neck: Appears normal, supple, no cervical masses, normal  ROM, no appreciable thyromegaly; difficult to assess JVD given her body habitus Respiratory: Diminished to auscultation bilaterally with coarse breath sounds and some crackles.  No appreciable wheezing, rales, rhonchi.  Has a normal respiratory effort and has unlabored breathing Cardiovascular: Irregularly irregular and not tachycardic., no murmurs / rubs / gallops. S1 and S2 auscultated.  Has lower extremity nonpitting and pitting edema in the ankle lymphedema and elephantiasis worse on the left compared to the right Abdomen: Soft, non-tender, distended secondary body habitus with surgical changes noted and has an Eakin's pouch from her colocutaneous fistula. Bowel sounds positive.  GU: Deferred. Musculoskeletal: No clubbing / cyanosis of digits/nails. No joint deformity upper and lower extremities but her left wrist is a little swollen and indurated Skin: No rashes, lesions, ulcers on limited skin evaluation but left wrist is swollen and indurated and slightly painful from where her IV was. No induration; Warm and dry.  Neurologic: CN 2-12 grossly intact with no focal deficits.  Romberg sign and cerebellar reflexes not assessed Psychiatric: Normal judgment and insight. Alert and  oriented x 3. Normal mood and appropriate affect.   Data Reviewed: I have personally reviewed following labs and imaging studies  CBC: Recent Labs  Lab 08/04/21 0545 08/05/21 0438 08/05/21 1940 08/06/21 0419 08/07/21 0600  WBC 9.0 9.0 9.4 8.9 10.4  NEUTROABS 6.8 5.3 5.7 5.5 7.7  HGB 8.0* 7.7* 8.4* 7.6* 8.0*  HCT 26.5* 26.0* 28.8* 25.7* 27.2*  MCV 90.1 90.6 91.7 92.1 93.5  PLT 183 202 244 207 465    Basic Metabolic Panel: Recent Labs  Lab 08/03/21 1011 08/04/21 0600 08/05/21 0438 08/06/21 0419 08/07/21 0600  NA 138 140 140 138 133*  K 4.1 3.9 4.1 4.2 4.5  CL 109 107 110 107 102  CO2 26 29 27 26 27   GLUCOSE 110* 74 80 91 94  BUN 25* 25* 25* 23 22  CREATININE 0.47 0.54 0.50 0.49 0.49  CALCIUM 7.8*  7.9* 7.9* 7.9* 7.7*  MG 1.9 1.9 2.0 2.1 1.9  PHOS 3.5 3.3 3.1 3.3 3.2    GFR: Estimated Creatinine Clearance: 84.6 mL/min (by C-G formula based on SCr of 0.49 mg/dL). Liver Function Tests: Recent Labs  Lab 08/03/21 1011 08/04/21 0600 08/05/21 0438 08/06/21 0419 08/07/21 0600  AST 41 38 35 40 45*  ALT 26 25 23 27  32  ALKPHOS 238* 210* 172* 186* 167*  BILITOT 1.0 0.9 0.9 0.7 1.0  PROT 6.1* 5.5* 5.6* 5.5* 5.9*  ALBUMIN 1.4* 1.2* 1.3* 1.3* 1.4*    No results for input(s): LIPASE, AMYLASE in the last 168 hours. No results for input(s): AMMONIA in the last 168 hours. Coagulation Profile: No results for input(s): INR, PROTIME in the last 168 hours. Cardiac Enzymes: No results for input(s): CKTOTAL, CKMB, CKMBINDEX, TROPONINI in the last 168 hours. BNP (last 3 results) No results for input(s): PROBNP in the last 8760 hours. HbA1C: No results for input(s): HGBA1C in the last 72 hours. CBG: No results for input(s): GLUCAP in the last 168 hours. Lipid Profile: Recent Labs    08/06/21 0417  TRIG 92    Thyroid Function Tests: No results for input(s): TSH, T4TOTAL, FREET4, T3FREE, THYROIDAB in the last 72 hours. Anemia Panel: No results for input(s): VITAMINB12, FOLATE, FERRITIN, TIBC, IRON, RETICCTPCT in the last 72 hours. Sepsis Labs: No results for input(s): PROCALCITON, LATICACIDVEN in the last 168 hours.   No results found for this or any previous visit (from the past 240 hour(s)).   RN Pressure Injury Documentation: Pressure Injury 07/18/21 Coccyx (Active)  07/18/21 2100  Location: Coccyx  Location Orientation:   Staging:   Wound Description (Comments):   Present on Admission:      Pressure Injury 07/18/21 Thigh Posterior;Proximal;Right (Active)  07/18/21 2100  Location: Thigh  Location Orientation: Posterior;Proximal;Right  Staging:   Wound Description (Comments):   Present on Admission:      Estimated body mass index is 54.64 kg/m as calculated from  the following:   Height as of this encounter: 5' 2"  (1.575 m).   Weight as of this encounter: 135.5 kg.  Malnutrition Type:  Nutrition Problem: Inadequate oral intake Etiology: altered GI function, acute illness  Malnutrition Characteristics:  Signs/Symptoms: NPO status  Nutrition Interventions:  Interventions: TPN   Radiology Studies: No results found.  Scheduled Meds:  Chlorhexidine Gluconate Cloth  6 each Topical Daily   feeding supplement  237 mL Oral BID BM   levothyroxine  50 mcg Intravenous Daily   mouth rinse  15 mL Mouth Rinse BID   [START ON 08/08/2021] pantoprazole  40  mg Oral Q0600   sodium chloride flush  10-40 mL Intracatheter Q12H   Continuous Infusions:  amiodarone 30 mg/hr (08/07/21 0430)   methocarbamol (ROBAXIN) IV 500 mg (07/25/21 2343)   TPN ADULT (ION) 105 mL/hr at 08/06/21 1734   TPN ADULT (ION)      LOS: 20 days   Kerney Elbe, DO [facility] PAGER is on AMION  If 7PM-7AM, please contact night-coverage [email or web address]

## 2021-08-07 NOTE — Progress Notes (Signed)
PHARMACY - TOTAL PARENTERAL NUTRITION CONSULT NOTE  Indication: Fistula  Patient Measurements: Height: _0  (157.5 cm) Weight: 135.5 kg (298 lb 11.6 oz) IBW/kg (Calculated) : 50.1 TPN AdjBW (KG): 66.6 Body mass index is 54.64 kg/m.  Assessment:  47 YOF with recent R-hemicolectomy on 8/12 in the setting of a cecal mass - consistent with R-colon metastatic adenocarcinoma. 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 Pharmacy consulted to manage TPN for nutritional support in the setting of a colocutaneous fistula.    Glucose / Insulin: CBGs controlled, SSI d/c'd 8/28 Electrolytes: all WNL - K 4.5 (goal >/=4 for Afib), Mag 1.9 (goal >/= 2 for Afib). CoCa 9.78 ok (albumin 1.4) Renal:  SCr < 1, BUN WNL Hepatic: LFTs / tibili / TG WNL, AST now slightly above ULN, alk phos stable 167, albumin 1.4 Intake / Output; MIVF: Drain output "5", UOP 741m= 0.258mkg/hr. LBM 9/13. - CLD: no po intake noted. Poor appetite.  GI Imaging: 8/24 Abd CT: colocutaneous fistula, 7.4cm abd wall abscess, soft tissue edema, cellulitis vs nec fasciitis 8/25 Abd xray: diffuse abd wall emphysema, nonobstructive bowel gas pattern 9/4 CT abd/pelvis: medial upper left thigh hematoma  [facility] Midline wound opened by Dr. JeArnoldo Moralet bedside  Central access: PICC replaced 07/27/21 TPN start date: 07/19/21  Nutritional Goals (RD assessment on 8/25): 2300-2500 kcal, 140-160g AA, fluid >/= 2L per day  Current Nutrition:  TPN - continue full TPN today per discussion with Surgery ([facility]) CLD started 9/11  Plan:  Slight electrolyte changed to TPN today, otherwise  Continue TPN at goal rate of 105 mL/hr, providing 151g AA and 2373 kCal meeting 100% of estimated needs Electrolytes in TPN: incr Na 130 mEq/L, K 45 mEq/L, Ca 17m83mL, Incr Mg 79m72m, Phos 22mm81m, Cl:Ac 1:1 Add standard MVI and trace elements to  TPN Monitor standard TPN labs on Monday/Thursday F/u Surgery plans for diet advancement and ability to wean TPN   Verner Kopischke S. RoberAlford HighlandrmD, BCPS Clinical Staff Pharmacist [email or web address] 08/07/2021 7:31 AM

## 2021-08-07 NOTE — Progress Notes (Signed)
Subjective: CC: Doing well. Tolerating cld without emesis but having occasional nausea. Minimal output from ecf (5cc/24 hours).   Objective: Vital signs in last 24 hours: Temp:  [98 F (36.7 C)-98.7 F (37.1 C)] 98.2 F (36.8 C) (09/13 0833) Pulse Rate:  [83-97] 92 (09/13 0833) Resp:  [16-19] 18 (09/13 0833) BP: (102-132)/(41-89) 111/48 (09/13 0833) SpO2:  [96 %-98 %] 98 % (09/13 0833) Weight:  [135.5 kg] 135.5 kg (09/13 0430) Last BM Date: 08/07/21  Intake/Output from previous day: 09/12 0701 - 09/13 0700 In: -  Out: 705 [Urine:700; Drains:5] Intake/Output this shift: No intake/output data recorded.  PE: Gen:  Alert, NAD, pleasant Abd: Soft, obese, ND, NT, +BS, eakins pouch w/ minimal output (small amount of light De[place] stool in bag and tubing, none in cannister)  Lab Results:  Recent Labs    08/06/21 0419 08/07/21 0600  WBC 8.9 10.4  HGB 7.6* 8.0*  HCT 25.7* 27.2*  PLT 207 236   BMET Recent Labs    08/06/21 0419 08/07/21 0600  NA 138 133*  K 4.2 4.5  CL 107 102  CO2 26 27  GLUCOSE 91 94  BUN 23 22  CREATININE 0.49 0.49  CALCIUM 7.9* 7.7*   PT/INR No results for input(s): LABPROT, INR in the last 72 hours. CMP     Component Value Date/Time   NA 133 (L) 08/07/2021 0600   K 4.5 08/07/2021 0600   CL 102 08/07/2021 0600   CO2 27 08/07/2021 0600   GLUCOSE 94 08/07/2021 0600   BUN 22 08/07/2021 0600   CREATININE 0.49 08/07/2021 0600   CALCIUM 7.7 (L) 08/07/2021 0600   PROT 5.9 (L) 08/07/2021 0600   ALBUMIN 1.4 (L) 08/07/2021 0600   AST 45 (H) 08/07/2021 0600   ALT 32 08/07/2021 0600   ALKPHOS 167 (H) 08/07/2021 0600   BILITOT 1.0 08/07/2021 0600   GFRNONAA >60 08/07/2021 0600   GFRAA >90 03/28/2014 0435   Lipase     Component Value Date/Time   LIPASE 25 03/27/2014 0600    Studies/Results: No results found.  Anti-infectives: Anti-infectives (From admission, onward)    Start     Dose/Rate Route Frequency Ordered Stop   07/20/21  1000  remdesivir 100 mg in sodium chloride 0.9 % 100 mL IVPB       See Hyperspace for full Linked Orders Report.   100 mg 200 mL/hr over 30 Minutes Intravenous Daily 07/19/21 1040 07/21/21 1148   07/20/21 0200  vancomycin (VANCOREADY) IVPB 750 mg/150 mL  Status:  Discontinued        750 mg 150 mL/hr over 60 Minutes Intravenous Every 24 hours 07/19/21 1036 07/19/21 1449   07/19/21 2300  anidulafungin (ERAXIS) 100 mg in sodium chloride 0.9 % 100 mL IVPB  Status:  Discontinued        100 mg 78 mL/hr over 100 Minutes Intravenous Every 24 hours 07/18/21 2107 07/19/21 1449   07/19/21 1600  vancomycin (VANCOREADY) IVPB 750 mg/150 mL  Status:  Discontinued        750 mg 150 mL/hr over 60 Minutes Intravenous Every 24 hours 07/19/21 0919 07/19/21 1016   07/19/21 1600  vancomycin (VANCOREADY) IVPB 750 mg/150 mL  Status:  Discontinued        750 mg 150 mL/hr over 60 Minutes Intravenous Every 24 hours 07/19/21 1016 07/19/21 1036   07/19/21 1400  piperacillin-tazobactam (ZOSYN) IVPB 3.375 g  Status:  Discontinued  3.375 g 12.5 mL/hr over 240 Minutes Intravenous Every 8 hours 07/19/21 1016 07/31/21 1501   07/19/21 1130  remdesivir 200 mg in sodium chloride 0.9% 250 mL IVPB       See Hyperspace for full Linked Orders Report.   200 mg 580 mL/hr over 30 Minutes Intravenous Once 07/19/21 1040 07/19/21 1241   07/19/21 0100  metroNIDAZOLE (FLAGYL) IVPB 500 mg  Status:  Discontinued        500 mg 100 mL/hr over 60 Minutes Intravenous Every 12 hours 07/18/21 1203 07/18/21 1534   07/18/21 2200  ceFEPIme (MAXIPIME) 2 g in sodium chloride 0.9 % 100 mL IVPB  Status:  Discontinued        2 g 200 mL/hr over 30 Minutes Intravenous Every 12 hours 07/18/21 1156 07/18/21 1203   07/18/21 2200  meropenem (MERREM) 2 g in sodium chloride 0.9 % 100 mL IVPB  Status:  Discontinued        2 g 200 mL/hr over 30 Minutes Intravenous Every 12 hours 07/18/21 2104 07/19/21 1011   07/18/21 2200  anidulafungin (ERAXIS) 200 mg  in sodium chloride 0.9 % 200 mL IVPB        200 mg 78 mL/hr over 200 Minutes Intravenous  Once 07/18/21 2108 07/19/21 0419   07/18/21 1800  clindamycin (CLEOCIN) IVPB 900 mg  Status:  Discontinued        900 mg 100 mL/hr over 30 Minutes Intravenous Every 8 hours 07/18/21 1534 07/18/21 2103   07/18/21 1600  vancomycin (VANCOREADY) IVPB 2000 mg/400 mL  Status:  Discontinued        2,000 mg 200 mL/hr over 120 Minutes Intravenous Every 24 hours 07/18/21 1544 07/19/21 0919   07/18/21 1545  piperacillin-tazobactam (ZOSYN) IVPB 3.375 g  Status:  Discontinued        3.375 g 12.5 mL/hr over 240 Minutes Intravenous Every 8 hours 07/18/21 1544 07/18/21 2104   07/18/21 1300  cefTRIAXone (ROCEPHIN) 2 g in sodium chloride 0.9 % 100 mL IVPB  Status:  Discontinued        2 g 200 mL/hr over 30 Minutes Intravenous Every 24 hours 07/18/21 1203 07/18/21 1628   07/18/21 1200  ceFEPIme (MAXIPIME) 2 g in sodium chloride 0.9 % 100 mL IVPB  Status:  Discontinued        2 g 200 mL/hr over 30 Minutes Intravenous Every 12 hours 07/18/21 1155 07/18/21 1156   07/18/21 0930  ceFEPIme (MAXIPIME) 2 g in sodium chloride 0.9 % 100 mL IVPB        2 g 200 mL/hr over 30 Minutes Intravenous  Once 07/18/21 0918 07/18/21 1135   07/18/21 0930  metroNIDAZOLE (FLAGYL) IVPB 500 mg  Status:  Discontinued        500 mg 100 mL/hr over 60 Minutes Intravenous  Once 07/18/21 [id number] 07/18/21 1534        Assessment/Plan 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 Arnoldo Morale at [place] on 8/12 - path w/ adenocarcinoma. Oncology has seen on 9/5 - Midline wound opened by Dr. Arnoldo Morale at bedside 8/24 and now has stool decompressing through CC fistula  - Currently has eakin's pouch. WOCN following.  - Minimal output since 9/10 and having bm's. Tolerating cld with some nausea. Will discuss with MD about adv to FLD. Cont TPN.  Monitor strict I/O.  - Low threshold to repeat CT if develops abdominal pain/tenderness,  leukocytosis, or fever   FEN - CLD. TPN. IVF per  primary VTE - SCDs ID - WBC normalized. Afebrile. None currently.    COVID + A. Fib on Xarelto at home  ABL anemia - FOBT positive 8/24. Monitor. Heparin currently held. Hgb stable on today's labs  Hypothyroidism Severe protein calorie malnutrition - albumin 1.3. Pre-alb <5. On TNA   LOS: 20 days    Jillyn Ledger , [facility] 08/07/2021, 8:58 AM Please see Amion for pager number during day hours 7:00am-4:30pm

## 2021-08-07 NOTE — Progress Notes (Signed)
Nutrition Follow-up  DOCUMENTATION CODES:  Not applicable  INTERVENTION:  -Continue TPN management per Pharmacy -Ensure Enlive po BID, each supplement provides 350 kcal and 20 grams of protein  NUTRITION DIAGNOSIS:  Inadequate oral intake related to altered GI function, acute illness as evidenced by NPO status. -- ongoing  GOAL:  Patient will meet greater than or equal to 90% of their needs -- addressing with TPN and supplements  MONITOR:  Labs, Weight trends, Skin, I & O's, Other (Comment) (TPN)  REASON FOR ASSESSMENT:  Consult Assessment of nutrition requirement/status, New TPN/TNA  ASSESSMENT:  75 year old female who presented to the ED on 8/24 s/p CPR. Pt with recent admission from 8/09 to 8/23 with anemia from GI bleed secondary to cecal mass s/p R hemicolectomy with primary anastomosis on 07/06/21. 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 Pt also found to be positive for COVID-19 on admission. PMH of atrial fibrillation, CHF, HTN, lymphedema, mitral regurgitation. Pt admitted with septic shock with abdominal wall fluid collection and concern for necrotizing fasciitis.  9/11 diet advanced to clear liquids 9/13 diet advanced to full liquids  GI evaluated pt today -- not recommending flexible sigmoidoscopy, colonoscopy, or EGD at this time. GI and Surgery agreed to advance diet to full liquids as pt tolerating clear liquid diet with no emesis and only mild/occasional reports of nausea. Intake remains inadequate so TPN is being continued at this time. Will order oral nutrition supplements and will plan to order calorie count once diet is further advanced/pt expresses improvement in nausea/appetite.   Fistula with minimal output, 4m documented x24 hours  UOP: [id number] hours  Labs: Na 133 (L) Medications: Protonix  Diet Order:   Diet Order             Diet full liquid Room service appropriate? Yes; Fluid consistency: Thin  Diet effective now                   EDUCATION NEEDS:  Not appropriate for education at this time  Skin:  Skin Assessment: Skin Integrity Issues: Skin Integrity Issues:: Other (Comment) Other: pressure injury to coccyx, pressure injury to R thigh, Eakins pouch to abdomen, MASD to bilateral buttocks  Last BM:  9/13  Height:  Ht Readings from Last 1 Encounters:  07/18/21 '5\' 2"'$  (1.575 m)   Weight:  Wt Readings from Last 1 Encounters:  08/07/21 135.5 kg   Ideal Body Weight:  50 kg  BMI:  Body mass index is 54.64 kg/m.  Estimated Nutritional Needs:  Kcal:  [id number]:  140-160 grams Fluid:  >/= 2.0 L    AmLarkin InaMS, RD, LDN (she/her/hers) RD pager number and weekend/on-call pager number located in [facility]

## 2021-08-07 NOTE — Consult Note (Addendum)
[facility] Consult: 8:38 AM 08/07/2021  LOS: 20 days    Referring Provider: Dr Alfredia Ferguson  Primary Care Physician:  Pcp, No Primary Gastroenterologist:  unassigned.       Reason for Consultation:  Hematochezia.     HPI: Lauren [name] is a 75 y.o. female.  CHF.  Echocardiogram 06/29/2021 with LVEF 30%, mild to moderate mitral regurg.  A. fib.  DVT on Xarelto as outpt.  Morbid obesity.  Hypothyroidism.  Anxiety.  Hypertension.  Lymphedema.  11/2012 biliary pancreatitis, recurrent in 03/2014 after patient did not follow-up for cholecystectomy.  03/25/2014 cholecystectomy and repair incarcerated umbilical hernia..  Brief 8/4-06/30/2021 admission to [facility] for constipation, anemia.  Hb 5.6, received 2 PRBCs, 8 at discharge.  CTAP showed cecal thickening concerning for cecal mass with adjacent small lymph nodes.  Patient had bowel movements after cathartics and left AMA prior to planned colonoscopy.  Echocardiogram was performed, results above. Prior to and since discharge not taking meds as prescribed including torsemide, Xarelto, diltiazem, Presented back to ER and admitted 8/9-8/23. 07/05/2021 colonoscopy by Dr. Hurshel Keys.  Fair colon prep.  Nonbleeding internal hemorrhoids.  Diverticulosis in sigmoid and descending colon.  2 5 to 7 mm polyps in transverse colon, resected (TAs).  Malignant appearing, partially obstructing tumor at ascending colon, biopsied (invasive, moderately differentiated adenocarcinoma).  3 polyps in ascending colon  Underwent right hemicolectomy with Dr. Arnoldo Morale on 07/06/2021.  Pathology review confirms invasive, moderately differentiated adenocarcinoma,  clear margins.  Developed postop ileus with nausea vomiting.  IDA treated with ferric Gluconate infusions.  Discharged to SNF on po iron.     Returned to ED within 24 hours w abd pain, N/V, hypotension, sepsis, incidental Covid 19 positive.  8/24 CTAP confirmed fistula extending from region of suture line to an umbilical hernia, into abdominal wall, connected to overlying skin.  Fistula communicates with 7.4 x 5.7 x 6.9 fluid collection at abdominal wall with gas tracking into soft tissues and right back, axilla concerning for necrotizing fasciitis.  Dr Arnoldo Morale opened wound at bedside 8/24.  Transferred to [facility].   Treated with antibiotics, fluids, pressors, bowel rest, TPN. Surgery has added Eakins pouch to suction and output of fecal material has signif decreased in recent days.   Pt passing mostly Breed stool from rectum but developed single episode of dark red bloody and Dunne stool on 9/11 and IV heparin held, Protonix IV bid initiated (no PPI before that).  No accompanying abdominal pain.  Had been tolerating full liquids but diet regressed to clear liquids after bloody stool.  Yesterday stool was [place].  No bloody stool emerging from fistula. 07/29/2021 CTAP w contrast: 14.1 x 9.4 cm hematoma L upper thigh.  Suspected left psoas muscle hematoma.  Ileocolic anastomosis appears intact.  Extensive bil, R >>L SQ emphysema in the ventral abdominal pelvic wall.  No drainable fluid collections.  Progressive anasarca.  Bilateral dependent pleural effusions.  Irregular liver surface, unable to exclude cirrhosis.  No discrete liver masses.  Moderate sigmoid diverticulosis. Received PRBC x 4 so far, 2  on 9/4 (Hgb 4), 2 on 9/11(Hgb 7.7).      Past Medical History:  Diagnosis Date   Acute cholecystitis 12/06/2012   Chronic combined systolic and diastolic CHF (congestive heart failure) (HCC)    Colon cancer (HCC)    Diverticulosis    Hypotension    BP runs soft   Hypothyroidism    Lymphedema    Morbid obesity ([place])    Obesity    Pancreatitis, acute 53/61/4431   Periumbilical hernia    Permanent atrial fibrillation [facility])     Past  Surgical History:  Procedure Laterality Date   BIOPSY  07/05/2021   Procedure: BIOPSY;  Surgeon: [name], DO;  [facility]: [facility];  Service: Endoscopy;;   CHOLECYSTECTOMY N/A 03/25/2014   Procedure: LAPAROSCOPIC CHOLECYSTECTOMY WITH INTRAOPERATIVE CHOLANGIOGRAM;  Surgeon: Ralene Ok, MD;  Location: ;  Service: General;  Laterality: N/A;   COLONOSCOPY WITH PROPOFOL N/A 07/05/2021   Procedure: COLONOSCOPY WITH PROPOFOL;  Surgeon: [name], DO;  Location: [facility];  Service: Endoscopy;  Laterality: N/A;   ESOPHAGOGASTRODUODENOSCOPY (EGD) WITH PROPOFOL N/A 07/05/2021   Procedure: ESOPHAGOGASTRODUODENOSCOPY (EGD) WITH PROPOFOL;  Surgeon: [name], DO;  Location: [facility];  Service: Endoscopy;  Laterality: N/A;   PARTIAL COLECTOMY N/A 07/06/2021   Procedure: RIGHT HEMICOLECTOMY;  Surgeon: [name] Signs, MD;  Location: [facility] ORS;  Service: General;  Laterality: N/A;   POLYPECTOMY  07/05/2021   Procedure: POLYPECTOMY;  Surgeon: [name], DO;  Location: [facility];  Service: Endoscopy;;    Prior to Admission medications   Medication Sig Start Date End Date Taking? Authorizing Provider  acetaminophen (TYLENOL) 500 MG tablet Take 1,000 mg by mouth every 6 (six) hours as needed.    [provider]  ferrous sulfate 325 (65 FE) MG EC tablet Take 1 tablet (325 mg total) by mouth 2 (two) times daily. 07/17/21 07/17/22  Kathie Dike, MD  levothyroxine (SYNTHROID) 100 MCG tablet Take 1 tablet (100 mcg total) by mouth daily. 11/10/19   Arnoldo Lenis, MD  loperamide (IMODIUM) 2 MG capsule Take 1 capsule (2 mg total) by mouth every 6 (six) hours as needed for diarrhea or loose stools. 07/17/21   Kathie Dike, MD  metoprolol succinate (TOPROL-XL) 25 MG 24 hr tablet Take 1 tablet (25 mg total) by mouth 2 (two) times daily. 07/17/21   Kathie Dike, MD  ondansetron (ZOFRAN-ODT) 4 MG disintegrating tablet Take 1 tablet (4 mg total) by mouth every  6 (six) hours as needed for nausea. 07/17/21   Kathie Dike, MD  oxyCODONE (OXY IR/ROXICODONE) 5 MG immediate release tablet Take 1 tablet (5 mg total) by mouth every 6 (six) hours as needed for moderate pain. 07/17/21   Kathie Dike, MD  pantoprazole (PROTONIX) 40 MG tablet Take 1 tablet (40 mg total) by mouth daily at 12 noon. 07/18/21   Kathie Dike, MD  polyethylene glycol (MIRALAX / GLYCOLAX) 17 g packet Take 17 g by mouth daily as needed for mild constipation. 07/17/21   Kathie Dike, MD  potassium chloride SA (KLOR-CON) 20 MEQ tablet TAKE ONE (1) TABLET EACH DAY Patient not taking: No sig reported 09/20/20   Arnoldo Lenis, MD  rivaroxaban (XARELTO) 20 MG TABS tablet Take 1 tablet (20 mg total) by mouth daily with supper. 07/17/21   Kathie Dike, MD  torsemide (DEMADEX) 20 MG tablet Take 1 tablet (20 mg total) by mouth daily. 07/17/21   Kathie Dike, MD    Scheduled  Meds:  Chlorhexidine Gluconate Cloth  6 each Topical Daily   levothyroxine  50 mcg Intravenous Daily   mouth rinse  15 mL Mouth Rinse BID   pantoprazole (PROTONIX) IV  40 mg Intravenous Q12H   sodium chloride flush  10-40 mL Intracatheter Q12H   Infusions:  amiodarone 30 mg/hr (08/07/21 0430)   methocarbamol (ROBAXIN) IV 500 mg (07/25/21 2343)   TPN ADULT (ION) 105 mL/hr at 08/06/21 1734   TPN ADULT (ION)     PRN Meds: diphenhydrAMINE, haloperidol lactate, HYDROmorphone (DILAUDID) injection **OR** [DISCONTINUED]  HYDROmorphone (DILAUDID) injection, methocarbamol (ROBAXIN) IV, ondansetron (ZOFRAN) IV, sodium chloride flush, sodium chloride flush   Allergies as of 07/18/2021 - Review Complete 07/18/2021  Allergen Reaction Noted   Codeine Itching 10/10/2012   Compazine [prochlorperazine edisylate]  10/10/2012   Prednisone Nausea And Vomiting 12/04/2012   Tetanus toxoids Other (See Comments) 07/06/2018   Vicodin [hydrocodone-acetaminophen] Itching 03/23/2014    Family History  Problem Relation Age  of Onset   Cancer Father        liver   Hypertension Brother    Heart disease Maternal Grandmother    Kidney disease Maternal Grandfather    Diabetes Maternal Grandfather    Kidney failure Brother     Social History   Socioeconomic History   Marital status: Married    Spouse name: Not on file   Number of children: Not on file   Years of education: Not on file   Highest education level: Not on file  Occupational History   Not on file  Tobacco Use   Smoking status: Never   Smokeless tobacco: Never  Vaping Use   Vaping Use: Never used  Substance and Sexual Activity   Alcohol use: No   Drug use: No   Sexual activity: Not Currently  Other Topics Concern   Not on file  Social History Narrative   Not on file   Social Determinants of Health   Financial Resource Strain: Not on file  Food Insecurity: Not on file  Transportation Needs: Not on file  Physical Activity: Not on file  Stress: Not on file  Social Connections: Not on file  Intimate Partner Violence: Not on file    REVIEW OF SYSTEMS: Constitutional: Weakness.  Very difficult for her to even turn herself from side to side in the bed. ENT:  No nose bleeds Pulm: Denies shortness of breath.  Occasional cough, sometimes after PO. CV: No angina.  No signs of tachypalpitations.  No chest pain.  Chronic bilateral lower extremity lymphedema. GU:  No hematuria, no frequency GI: See HPI. Heme: See HPI. Transfusions: See HPI. Neuro:  No headaches, no peripheral tingling or numbness.  No tremors.  No seizures, no syncope. Derm:  No itching, no rash or sores.  Endocrine:  No sweats or chills.  No polyuria or dysuria Immunization: Not queried. Travel:  None beyond local counties in last few months.    PHYSICAL EXAM: Vital signs in last 24 hours: Vitals:   08/07/21 0325 08/07/21 0833  BP: 110/68 (!) 111/48  Pulse: 94 92  Resp: 16 18  Temp: 98 F (36.7 C) 98.2 F (36.8 C)  SpO2: 97% 98%   Wt Readings from Last 3  Encounters:  08/07/21 135.5 kg  07/10/21 114.9 kg  03/10/20 122.5 kg    General: Morbidly obese.  Comfortable.  Anxious. Head: No facial asymmetry or swelling.  No signs of head trauma. Eyes: Conjunctiva pink.  No scleral icterus.  EOMI. Ears: No  gross hearing deficit. Nose: No congestion or discharge Mouth: Poor dentition.  Large percentage of her teeth are gone and remaining teeth caries ridden.  Tongue is midline.  Mucosa is moist, pink, clear. Neck: No JVD, no masses, no thyromegaly Lungs: Diminished breath sounds but clear.  No labored breathing.  Did observe a dry cough after she drank some juice through a straw. Heart: Heart rate irregular, A. fib with rates in the low 100s to 1 teens on monitor.  No MRG.  S1, S2 audible. Abdomen: Pouch overlying large ostomy opening in right midabdomen.  Contains very light Rendall stool.  No visible blood.  No tenderness.  Active bowel sounds. Rectal: Hard to visualize due to patient's body habitus.  No tenderness on digital exam.  No palpable masses.  Small, nonthrombosed external hemorrhoids.  Stool is light [place], same as what is seen emerging from fistula.  Stool is FOBT negative. Extremities: Massive bilateral, left greater than right lymphedema involving lower and upper legs. Neurologic: Alert.  Oriented x3.  Moves all 4 limbs, strength not tested.  No gross deficits. Skin: No obvious sores, significant bruising or rashes. Nodes: No cervical adenopathy Psych: Cooperative, fluid speech.  Anxious.  Intake/Output from previous day: 09/12 0701 - 09/13 0700 In: -  Out: 705 [Urine:700; Drains:5] Intake/Output this shift: No intake/output data recorded.  LAB RESULTS: Recent Labs    08/05/21 1940 08/06/21 0419 08/07/21 0600  WBC 9.4 8.9 10.4  HGB 8.4* 7.6* 8.0*  HCT 28.8* 25.7* 27.2*  PLT 244 207 236   BMET Lab Results  Component Value Date   NA 133 (L) 08/07/2021   NA 138 08/06/2021   NA 140 08/05/2021   K 4.5 08/07/2021   K 4.2  08/06/2021   K 4.1 08/05/2021   CL 102 08/07/2021   CL 107 08/06/2021   CL 110 08/05/2021   CO2 27 08/07/2021   CO2 26 08/06/2021   CO2 27 08/05/2021   GLUCOSE 94 08/07/2021   GLUCOSE 91 08/06/2021   GLUCOSE 80 08/05/2021   BUN 22 08/07/2021   BUN 23 08/06/2021   BUN 25 (H) 08/05/2021   CREATININE 0.49 08/07/2021   CREATININE 0.49 08/06/2021   CREATININE 0.50 08/05/2021   CALCIUM 7.7 (L) 08/07/2021   CALCIUM 7.9 (L) 08/06/2021   CALCIUM 7.9 (L) 08/05/2021   LFT Recent Labs    08/05/21 0438 08/06/21 0419 08/07/21 0600  PROT 5.6* 5.5* 5.9*  ALBUMIN 1.3* 1.3* 1.4*  AST 35 40 45*  ALT 23 27 32  ALKPHOS 172* 186* 167*  BILITOT 0.9 0.7 1.0   PT/INR Lab Results  Component Value Date   INR 2.9 (H) 07/18/2021   Hepatitis Panel No results for input(s): HEPBSAG, HCVAB, HEPAIGM, HEPBIGM in the last 72 hours. C-Diff No components found for: CDIFF Lipase     Component Value Date/Time   LIPASE 25 03/27/2014 0600    Drugs of Abuse  No results found for: LABOPIA, COCAINSCRNUR, LABBENZ, AMPHETMU, THCU, LABBARB   RADIOLOGY STUDIES: No results found.    IMPRESSION:   Bleeding per rectum.  This could have come from hemorrhoids, diverticulosis, surgical anastomosis.  Occurred once and has since resolved with discontinuation of anticoagulation.     Blood loss anemia from large thigh hematoma.  Has a baseline of iron deficiency anemia diagnosed early August.  May have had some decline Hgb from intestinal bleeding of 2 days ago.  Has received 4 PRBCs thus far.  Also treated with ferric gluconate infusions and PRBCs  last month.   Atrial fibrillation, was on Eliquis, subsequently SQ Lovenox which is now on hold.    R colon adenocarcinoma and adenomatous colon polyps.  Status post 07/06/2021 right hemicolectomy.  Subsequently developed colocutaneous fistula, being treated with Eakin's pouch to suction.     Anxiety.     Irregular hepatic echotexture, cannot exclude cirrhosis.   No thrombocytopenia.  Only INR assay was 8/24 when it was 2.9.  LFTs normal until rise in Alk phos began on 9/8: 229 >> 167 over last 8 days.  T bili consistently normal.  Transaminases normal until today when AST has bumped to 45.  PLAN:        Dr. Lorenso Courier will see pt later today.  However at this point I do not see urgent need for flexible sigmoidoscopy, colonoscopy or EGD.  Would like to be able to advance her diet to full liquids.  Messaged surgery to see if it would be okay to advance diet.    Stopped Protonix IV.  Resume PPI po 1 x daily.  Check INR in AM.  Daily CBC in place.     Azucena Freed  08/07/2021, 8:38 AM Phone [phone]

## 2021-08-08 DIAGNOSIS — A419 Sepsis, unspecified organism: Secondary | ICD-10-CM | POA: Diagnosis not present

## 2021-08-08 DIAGNOSIS — R6521 Severe sepsis with septic shock: Secondary | ICD-10-CM | POA: Diagnosis not present

## 2021-08-08 LAB — COMPREHENSIVE METABOLIC PANEL
ALT: 28 U/L (ref 0–44)
AST: 36 U/L (ref 15–41)
Albumin: 1.4 g/dL — ABNORMAL LOW (ref 3.5–5.0)
Alkaline Phosphatase: 180 U/L — ABNORMAL HIGH (ref 38–126)
Anion gap: 6 (ref 5–15)
BUN: 20 mg/dL (ref 8–23)
CO2: 26 mmol/L (ref 22–32)
Calcium: 7.7 mg/dL — ABNORMAL LOW (ref 8.9–10.3)
Chloride: 98 mmol/L (ref 98–111)
Creatinine, Ser: 0.52 mg/dL (ref 0.44–1.00)
GFR, Estimated: >60 mL/min (ref >60)
Glucose, Bld: 100 mg/dL — ABNORMAL HIGH (ref 70–99)
Potassium: 4.6 mmol/L (ref 3.5–5.1)
Sodium: 130 mmol/L — ABNORMAL LOW (ref 135–145)
Total Bilirubin: 1 mg/dL (ref 0.3–1.2)
Total Protein: 5.7 g/dL — ABNORMAL LOW (ref 6.5–8.1)

## 2021-08-08 LAB — CBC WITH DIFFERENTIAL/PLATELET
Abs Immature Granulocytes: 0.3 10*3/uL — ABNORMAL HIGH (ref 0.00–0.07)
Basophils Absolute: 0.1 10*3/uL (ref 0.0–0.1)
Basophils Relative: 1 %
Eosinophils Absolute: 0.6 10*3/uL — ABNORMAL HIGH (ref 0.0–0.5)
Eosinophils Relative: 4 %
HCT: 25.2 % — ABNORMAL LOW (ref 36.0–46.0)
Hemoglobin: 7.6 g/dL — ABNORMAL LOW (ref 12.0–15.0)
Immature Granulocytes: 2 %
Lymphocytes Relative: 13 %
Lymphs Abs: 1.6 10*3/uL (ref 0.7–4.0)
MCH: 27.5 pg (ref 26.0–34.0)
MCHC: 30.2 g/dL (ref 30.0–36.0)
MCV: 91.3 fL (ref 80.0–100.0)
Monocytes Absolute: 1.5 10*3/uL — ABNORMAL HIGH (ref 0.1–1.0)
Monocytes Relative: 12 %
Neutro Abs: 8.7 10*3/uL — ABNORMAL HIGH (ref 1.7–7.7)
Neutrophils Relative %: 68 %
Platelets: 226 10*3/uL (ref 150–400)
RBC: 2.76 MIL/uL — ABNORMAL LOW (ref 3.87–5.11)
RDW: 23.8 % — ABNORMAL HIGH (ref 11.5–15.5)
WBC: 12.7 10*3/uL — ABNORMAL HIGH (ref 4.0–10.5)
nRBC: 0.3 % — ABNORMAL HIGH (ref 0.0–0.2)

## 2021-08-08 LAB — PROTIME-INR
INR: 1.2 (ref 0.8–1.2)
Prothrombin Time: 15.6 seconds — ABNORMAL HIGH (ref 11.4–15.2)

## 2021-08-08 LAB — PHOSPHORUS: Phosphorus: 3 mg/dL (ref 2.5–4.6)

## 2021-08-08 LAB — MAGNESIUM: Magnesium: 2 mg/dL (ref 1.7–2.4)

## 2021-08-08 MED ORDER — TRAVASOL 10 % IV SOLN
INTRAVENOUS | Status: AC
  Filled 2021-08-08: qty 1512

## 2021-08-08 NOTE — Progress Notes (Signed)
PROGRESS NOTE    Lauren [name]  [id number] DOB: 27-May-1946 DOA: 07/18/2021 PCP: [name], No    Brief Narrative:  Lauren [name] is a 75 year old female with past medical history significant for proximal atrial fibrillation, chronic combined systolic and diastolic congestive heart failure, benign essential hypertension, lymphedema, obesity who presented to [facility] ED on [id number] from [place] after she was found to be hypotensive.  Patient was complaining of nausea, abdominal pain, right-sided back pain.  CT abdomen/pelvis showing fistula extending from bowel adjacent to suture through an umbilical hernia into the abdominal wall with evidence for connection to the overlying skin and fluid collection and gas tracking anterior abdominal wall concerning for necrotizing fasciitis.  Patient was initially admitted to the PCCM service at [facility] for sepsis secondary to colocutaneous fistula with surrounding cellulitis with possible necrotizing fasciitis of abdominal wall.;  Started on empiric antibiotics with meropenem, vancomycin, anidulafungin, and norepinephrine for vasopressor support.  General surgery was consulted.  Patient was transferred from [facility] to [facility], ICU.  Patient was started on TPN 8/25.  Transferred from PCCM service to [facility] on 8/27.   Assessment & Plan:   Principal Problem:   Septic shock ([place]) Active Problems:   Hypokalemia   Morbid obesity with BMI of 50.0-59.9, adult (HCC)   Colon carcinoma ([place])   Enterocutaneous fistula   Cellulitis   AKI (acute kidney injury) ([place])   Hyponatremia   Septic shock, POA Colocutaneous fistula with surrounding cellulitis/abscess Patient initially presenting to [facility] with hypotension and found to have colocutaneous fistula with surrounding cellulitis/abscess and initial concern for necrotizing fasciitis.  Was seen by general surgery, Dr. Arnoldo Morale who performed a bedside decompression of abscess.   Patient was initially placed on empiric antibiotics with meropenem, vancomycin and anidulafungin with vasopressor support with norepinephrine.  Transferred to [facility] intensive care unit. --Norepinephrine now titrated off; completed course of empiric antibiotics --General surgery following, appreciate assistance --Continue TPN, pharmacy consulted for management --Full liquid diet --Continue monitor ostomy output --Supportive care  Acute blood loss anemia Left thigh hematoma CT scan abdomen/pelvis notable for left thigh hematoma.  Evaluated by orthopedics, Dr. Ninfa Linden who recommended continued observation with no recommendation of surgical intervention to evacuate any type of hematoma.  Transfused  Atrial fibrillation with RVR Seen by cardiology, now signed off 08/03/2021.  Was on Xarelto outpatient. --Continues on amiodarone drip; and recommend to transition to PO when able --Currently holding anticoagulation given thigh hematoma/acute blood loss anemia --Repeat CBC in a.m.  Chronic combined systolic/diastolic congestive heart failure. TTE 06/29/2021 in care everywhere with LVEF 30%, mild/moderate MVR, LA severely dilated. Evaluated by cardiology, not a candidate for invasive or aggressive intervention. --Strict I's and O's and daily weights  Hx colon adenocarcinoma Patient underwent right hemicolectomy for adenocarcinoma of the cecum on 07/06/2021.  Seen by medical oncology, Dr. Chryl Heck on 07/30/2021.  From the adenocarcinoma standpoint, no evidence of distant metastasis with overall good prognosis. --Plan outpatient follow-up medical oncology in [place].  Acute renal failure: Resolved Etiology likely secondary to ATN from septic shock as above. --Cr 1.58>>0.52 --Continue monitor BMP closely  Hypothyroidism: Levothyroxine 50 mcg IV daily  Acute delirium: Resolved  GERD: Continue PPI  COVID-19 viral infection, incidental. Completed course of remdesivir.  Oxygenating well on room  air.  Hyponatremia Hypokalemia Hypomagnesmia Repleted during hospitalization.  Currently on TPN.  Weakness/debility/deconditioning: --PT/OT recommending SNF placement. --Social work for assistance --Continue therapy while inpatient  Morbid obesity Body mass  index is 56.29 kg/m.  Discussed with patient needs for aggressive lifestyle changes/weight loss as this complicates all facets of care.  Outpatient follow-up with PCP.   Goals of care Patient was seen by palliative care.  CODE STATUS changed to DNR/DNI.       DVT prophylaxis: Place and maintain sequential compression device Start: 07/18/21 1202 Place TED hose Start: 07/18/21 1202   Code Status: DNR Family Communication: No family present at bedside this morning  Disposition Plan:  Level of care: Progressive Status is: Inpatient  Remains inpatient appropriate because:Unsafe d/c plan, IV treatments appropriate due to intensity of illness or inability to take PO, and Inpatient level of care appropriate due to severity of illness  Dispo: The patient is from: Home              Anticipated d/c is to: SNF              Patient currently is not medically stable to d/c.   Difficult to place patient No    Consultants:  PCCM - signed off 8/27 General surgery Palliative care Cardiology  -  signed off 9/9  Procedures:  PICC  Antimicrobials:  Vancomycin 8/24 - 8/25 Cefepime 8/24 - 8/24 Metronidazole 8/24 - 8/24 Meropenem 8/25 - 8/26 Clindamycin 8 /24 - 8/24 Zosyn 8/25 - 9/6 Anidulafungin 8/24 - 8/25   Subjective: Patient seen examined at bedside, resting comfortably.  No specific complaints this morning.  Denies any pain.  Continues with decent ostomy output.  Was transitioned to full liquid diet yesterday by general surgery.  No other specific questions or concerns at this time.  Denies headache, no chest pain, no palpitations, no shortness of breath, no abdominal pain, no dizziness, no fever/chills/night sweats, no  nausea/vomiting.  No acute events overnight per nursing staff.  Objective: Vitals:   08/07/21 2006 08/08/21 0543 08/08/21 1045 08/08/21 1237  BP: 115/61  (!) 104/48 (!) 94/52  Pulse: 98  100   Resp: 19  20 (!) 26  Temp: 97.9 F (36.6 C)  98 F (36.7 C) 98.6 F (37 C)  TempSrc: Oral  Oral Oral  SpO2: 96%  96%   Weight:  (!) 139.6 kg    Height:        Intake/Output Summary (Last 24 hours) at 08/08/2021 1459 Last data filed at 08/08/2021 0125 Gross per 24 hour  Intake 250 ml  Output 700 ml  Net -450 ml   Filed Weights   08/05/21 0427 08/07/21 0430 08/08/21 0543  Weight: 129 kg 135.5 kg (!) 139.6 kg    Examination:  General exam: Appears calm and comfortable, chronically ill appearance Respiratory system: Clear to auscultation. Respiratory effort normal.  On room air Cardiovascular system: S1 & S2 heard, RRR. No JVD, murmurs, rubs, gallops or clicks. No pedal edema. Gastrointestinal system: Abdomen is nondistended, soft and nontender. No organomegaly or masses felt. Normal bowel sounds heard.  Ostomy noted with yellow/tan material in ostomy bag Central nervous system: Alert and oriented. No focal neurological deficits. Extremities: Symmetric 5 x 5 power. Skin: No rashes, lesions or ulcers Psychiatry: Judgement and insight appear poor. Mood & affect appropriate.     Data Reviewed: I have personally reviewed following labs and imaging studies  CBC: Recent Labs  Lab 08/05/21 0438 08/05/21 1940 08/06/21 0419 08/07/21 0600 08/08/21 0500  WBC 9.0 9.4 8.9 10.4 12.7*  NEUTROABS 5.3 5.7 5.5 7.7 8.7*  HGB 7.7* 8.4* 7.6* 8.0* 7.6*  HCT 26.0* 28.8* 25.7* 27.2* 25.2*  MCV 90.6 91.7 92.1 93.5 91.3  PLT 202 244 207 236 [id number]   Basic Metabolic Panel: Recent Labs  Lab 08/04/21 0600 08/05/21 0438 08/06/21 0419 08/07/21 0600 08/08/21 0500  NA 140 140 138 133* 130*  K 3.9 4.1 4.2 4.5 4.6  CL 107 110 107 102 98  CO2 '29 27 26 27 26  '$ GLUCOSE 74 80 91 94 100*  BUN 25* 25* '23  22 20  '$ CREATININE 0.54 0.50 0.49 0.49 0.52  CALCIUM 7.9* 7.9* 7.9* 7.7* 7.7*  MG 1.9 2.0 2.1 1.9 2.0  PHOS 3.3 3.1 3.3 3.2 3.0   GFR: Estimated Creatinine Clearance: 86.2 mL/min (by C-G formula based on SCr of 0.52 mg/dL). Liver Function Tests: Recent Labs  Lab 08/04/21 0600 08/05/21 0438 08/06/21 0419 08/07/21 0600 08/08/21 0500  AST 38 35 40 45* 36  ALT '25 23 27 '$ 32 28  ALKPHOS 210* 172* 186* 167* 180*  BILITOT 0.9 0.9 0.7 1.0 1.0  PROT 5.5* 5.6* 5.5* 5.9* 5.7*  ALBUMIN 1.2* 1.3* 1.3* 1.4* 1.4*   No results for input(s): LIPASE, AMYLASE in the last 168 hours. No results for input(s): AMMONIA in the last 168 hours. Coagulation Profile: Recent Labs  Lab 08/08/21 0500  INR 1.2   Cardiac Enzymes: No results for input(s): CKTOTAL, CKMB, CKMBINDEX, TROPONINI in the last 168 hours. BNP (last 3 results) No results for input(s): PROBNP in the last 8760 hours. HbA1C: No results for input(s): HGBA1C in the last 72 hours. CBG: No results for input(s): GLUCAP in the last 168 hours. Lipid Profile: Recent Labs    08/06/21 0417  TRIG 92   Thyroid Function Tests: No results for input(s): TSH, T4TOTAL, FREET4, T3FREE, THYROIDAB in the last 72 hours. Anemia Panel: No results for input(s): VITAMINB12, FOLATE, FERRITIN, TIBC, IRON, RETICCTPCT in the last 72 hours. Sepsis Labs: No results for input(s): PROCALCITON, LATICACIDVEN in the last 168 hours.  No results found for this or any previous visit (from the past 240 hour(s)).       Radiology Studies: No results found.      Scheduled Meds:  Chlorhexidine Gluconate Cloth  6 each Topical Daily   feeding supplement  237 mL Oral BID BM   levothyroxine  50 mcg Intravenous Daily   mouth rinse  15 mL Mouth Rinse BID   pantoprazole  40 mg Oral Q0600   sodium chloride flush  10-40 mL Intracatheter Q12H   Continuous Infusions:  amiodarone 30 mg/hr (08/08/21 0544)   methocarbamol (ROBAXIN) IV 500 mg (07/25/21 2343)   TPN  ADULT (ION) 105 mL/hr at 08/07/21 1803   TPN ADULT (ION)       LOS: 21 days    Time spent: 39 minutes spent on chart review, discussion with nursing staff, consultants, updating family and interview/physical exam; more than 50% of that time was spent in counseling and/or coordination of care.    Jori Frerichs J [place] DO [facility] Available via Epic secure chat 7am-7pm After these hours, please refer to coverage provider listed on [email or web address] 08/08/2021, 2:59 PM

## 2021-08-08 NOTE — Progress Notes (Signed)
   08/08/21 2010  Assess: MEWS Score  ECG Heart Rate (!) 101  Resp 18  Assess: MEWS Score  MEWS Temp 0  MEWS Systolic 1  MEWS Pulse 1  MEWS RR 0  MEWS LOC 0  MEWS Score 2  MEWS Score Color Yellow  Assess: if the MEWS score is Yellow or Red  Were vital signs taken at a resting state? Yes  Focused Assessment No change from prior assessment  Early Detection of Sepsis Score *See Row Information* Low  MEWS guidelines implemented *See Row Information* No, previously yellow, continue vital signs every 4 hours  No signs of acute distress noted, previous yellow MEWS charted. Will continue to monitor pt

## 2021-08-08 NOTE — Progress Notes (Signed)
Occupational Therapy Treatment Patient Details Name: Lauren [name] MRN: [id number] DOB: 05/15/1946 Today's Date: 08/08/2021   History of present illness 75 yo female admitted [id number] from SNF [facility]) with hypotension and Covid (+). Pt with colocutaneous fistula, Afib and new CHf since admission. Pt also with abdominal wall cellulitis and left thigh hematoma requiring blood transfusion. Pt with recent admission (8/9-8/23) to [facility] with partially obstructing tumor in the ascending colon s/p R hemicolectomy 07/06/21. PMH: A fib, morbid obesity, hypertension, lymphedema, hypothyroidism   OT comments  Patient was co-treat due of medical complexity.  Patient in bed and continues to be concerned over handling personnel issues but was willing to participate in bed with bed mobility/positioning and UE ROM exercises.  Acute OT to continue to follow.   Recommendations for follow up therapy are one component of a multi-disciplinary discharge planning process, led by the attending physician.  Recommendations may be updated based on patient status, additional functional criteria and insurance authorization.    Follow Up Recommendations  SNF    Equipment Recommendations  Wheelchair (measurements OT);Wheelchair cushion (measurements OT)    Recommendations for Other Services      Precautions / Restrictions Precautions Precautions: Fall Precaution Comments: painful LLE, bil LE edema, LUE cellulitis, ostomy to suction       Mobility Bed Mobility Overal bed mobility: Needs Assistance Bed Mobility: Rolling Rolling: Min assist         General bed mobility comments: assistance of 2 to position in bed with patient assisting using raills. Rolling performed side to side to adjust pads    Transfers                      Balance                                           ADL either performed or assessed with clinical judgement   ADL Overall ADL's : Needs  assistance/impaired     Grooming: Wash/dry hands;Wash/dry face;Set up;Bed level                                       Vision       Perception     Praxis      Cognition Arousal/Alertness: Awake/alert Behavior During Therapy: WFL for tasks assessed/performed Overall Cognitive Status: No family/caregiver present to determine baseline cognitive functioning                                 General Comments: patient concerned with personel issues; morgage, utilities        Exercises Exercises: General Upper Extremity General Exercises - Upper Extremity Shoulder Flexion: AROM;10 reps;Supine Shoulder ABduction: AROM;10 reps;Supine   Shoulder Instructions       General Comments      Pertinent Vitals/ Pain       Pain Assessment: No/denies pain  Home Living                                          Prior Functioning/Environment              Frequency  Min 1X/week  Progress Toward Goals  OT Goals(current goals can now be found in the care plan section)  Progress towards OT goals: Progressing toward goals  Acute Rehab OT Goals Patient Stated Goal: to get better OT Goal Formulation: With patient Time For Goal Achievement: 08/11/21 Potential to Achieve Goals: Good ADL Goals Pt Will Perform Grooming: with min guard assist;sitting Additional ADL Goal #1: Max A of one for bed mobility to reduce caregiver burden. Additional ADL Goal #2: Patient will sit edge of bed for up to 5 min to increase independence with grooming and bed mobility.  Plan Discharge plan remains appropriate    Co-evaluation    PT/OT/SLP Co-Evaluation/Treatment: Yes Reason for Co-Treatment: Complexity of the patient's impairments (multi-system involvement);Necessary to address cognition/behavior during functional activity   OT goals addressed during session: Strengthening/ROM      AM-PAC OT "6 Clicks" Daily Activity     Outcome  Measure   Help from another person eating meals?: A Little Help from another person taking care of personal grooming?: A Little Help from another person toileting, which includes using toliet, bedpan, or urinal?: Total Help from another person bathing (including washing, rinsing, drying)?: A Lot Help from another person to put on and taking off regular upper body clothing?: A Lot Help from another person to put on and taking off regular lower body clothing?: Total 6 Click Score: 12    End of Session    OT Visit Diagnosis: Other abnormalities of gait and mobility (R26.89);Muscle weakness (generalized) (M62.81);Other symptoms and signs involving cognitive function;Pain;Adult, failure to thrive (R62.7)   Activity Tolerance Other (comment) (Patient limited by concerns over personnel issues)   Patient Left in bed;with call bell/phone within reach   Nurse Communication Other (comment) (on bed mobility)        Time: [id number] OT Time Calculation (min): 25 min  Charges: OT General Charges $OT Visit: 1 Visit OT Treatments $Therapeutic Exercise: 8-22 mins  Lodema Hong, OTA   Trixie Dredge 08/08/2021, 9:36 AM

## 2021-08-08 NOTE — Progress Notes (Signed)
Subjective: CC: No n/v.  Tolerated fulls.  Not a significant amount of drainage.    Objective: Vital signs in last 24 hours: Temp:  [97.9 F (36.6 C)-98.6 F (37 C)] 98.6 F (37 C) (09/14 1237) Pulse Rate:  [98-100] 100 (09/14 1045) Resp:  [19-26] 26 (09/14 1237) BP: (94-115)/(48-61) 94/52 (09/14 1237) SpO2:  [96 %] 96 % (09/14 1045) Weight:  [139.6 kg] 139.6 kg (09/14 0543) Last BM Date: 08/07/21  Intake/Output from previous day: 09/13 0701 - 09/14 0700 In: 1226.7 [P.O.:480; I.V.:746.7] Out: 1200 [Urine:1200] Intake/Output this shift: No intake/output data recorded.  PE: Gen:  Alert, NAD, pleasant Abd: Soft, obese, ND, NT, +BS, eakins pouch w/ small amount of dark brownish output.    Lab Results:  Recent Labs    08/07/21 0600 08/08/21 0500  WBC 10.4 12.7*  HGB 8.0* 7.6*  HCT 27.2* 25.2*  PLT 236 226   BMET Recent Labs    08/07/21 0600 08/08/21 0500  NA 133* 130*  K 4.5 4.6  CL 102 98  CO2 27 26  GLUCOSE 94 100*  BUN 22 20  CREATININE 0.49 0.52  CALCIUM 7.7* 7.7*   PT/INR Recent Labs    08/08/21 0500  LABPROT 15.6*  INR 1.2   CMP     Component Value Date/Time   NA 130 (L) 08/08/2021 0500   K 4.6 08/08/2021 0500   CL 98 08/08/2021 0500   CO2 26 08/08/2021 0500   GLUCOSE 100 (H) 08/08/2021 0500   BUN 20 08/08/2021 0500   CREATININE 0.52 08/08/2021 0500   CALCIUM 7.7 (L) 08/08/2021 0500   PROT 5.7 (L) 08/08/2021 0500   ALBUMIN 1.4 (L) 08/08/2021 0500   AST 36 08/08/2021 0500   ALT 28 08/08/2021 0500   ALKPHOS 180 (H) 08/08/2021 0500   BILITOT 1.0 08/08/2021 0500   GFRNONAA >60 08/08/2021 0500   GFRAA >90 03/28/2014 0435   Lipase     Component Value Date/Time   LIPASE 25 03/27/2014 0600    Studies/Results: No results found.  Anti-infectives: Anti-infectives (From admission, onward)    Start     Dose/Rate Route Frequency Ordered Stop   07/20/21 1000  remdesivir 100 mg in sodium chloride 0.9 % 100 mL IVPB       See  Hyperspace for full Linked Orders Report.   100 mg 200 mL/hr over 30 Minutes Intravenous Daily 07/19/21 1040 07/21/21 1148   07/20/21 0200  vancomycin (VANCOREADY) IVPB 750 mg/150 mL  Status:  Discontinued        750 mg 150 mL/hr over 60 Minutes Intravenous Every 24 hours 07/19/21 1036 07/19/21 1449   07/19/21 2300  anidulafungin (ERAXIS) 100 mg in sodium chloride 0.9 % 100 mL IVPB  Status:  Discontinued        100 mg 78 mL/hr over 100 Minutes Intravenous Every 24 hours 07/18/21 2107 07/19/21 1449   07/19/21 1600  vancomycin (VANCOREADY) IVPB 750 mg/150 mL  Status:  Discontinued        750 mg 150 mL/hr over 60 Minutes Intravenous Every 24 hours 07/19/21 0919 07/19/21 1016   07/19/21 1600  vancomycin (VANCOREADY) IVPB 750 mg/150 mL  Status:  Discontinued        750 mg 150 mL/hr over 60 Minutes Intravenous Every 24 hours 07/19/21 1016 07/19/21 1036   07/19/21 1400  piperacillin-tazobactam (ZOSYN) IVPB 3.375 g  Status:  Discontinued        3.375 g 12.5 mL/hr over 240 Minutes  Intravenous Every 8 hours 07/19/21 1016 07/31/21 1501   07/19/21 1130  remdesivir 200 mg in sodium chloride 0.9% 250 mL IVPB       See Hyperspace for full Linked Orders Report.   200 mg 580 mL/hr over 30 Minutes Intravenous Once 07/19/21 1040 07/19/21 1241   07/19/21 0100  metroNIDAZOLE (FLAGYL) IVPB 500 mg  Status:  Discontinued        500 mg 100 mL/hr over 60 Minutes Intravenous Every 12 hours 07/18/21 1203 07/18/21 1534   07/18/21 2200  ceFEPIme (MAXIPIME) 2 g in sodium chloride 0.9 % 100 mL IVPB  Status:  Discontinued        2 g 200 mL/hr over 30 Minutes Intravenous Every 12 hours 07/18/21 1156 07/18/21 1203   07/18/21 2200  meropenem (MERREM) 2 g in sodium chloride 0.9 % 100 mL IVPB  Status:  Discontinued        2 g 200 mL/hr over 30 Minutes Intravenous Every 12 hours 07/18/21 2104 07/19/21 1011   07/18/21 2200  anidulafungin (ERAXIS) 200 mg in sodium chloride 0.9 % 200 mL IVPB        200 mg 78 mL/hr over 200  Minutes Intravenous  Once 07/18/21 2108 07/19/21 0419   07/18/21 1800  clindamycin (CLEOCIN) IVPB 900 mg  Status:  Discontinued        900 mg 100 mL/hr over 30 Minutes Intravenous Every 8 hours 07/18/21 1534 07/18/21 2103   07/18/21 1600  vancomycin (VANCOREADY) IVPB 2000 mg/400 mL  Status:  Discontinued        2,000 mg 200 mL/hr over 120 Minutes Intravenous Every 24 hours 07/18/21 1544 07/19/21 0919   07/18/21 1545  piperacillin-tazobactam (ZOSYN) IVPB 3.375 g  Status:  Discontinued        3.375 g 12.5 mL/hr over 240 Minutes Intravenous Every 8 hours 07/18/21 1544 07/18/21 2104   07/18/21 1300  cefTRIAXone (ROCEPHIN) 2 g in sodium chloride 0.9 % 100 mL IVPB  Status:  Discontinued        2 g 200 mL/hr over 30 Minutes Intravenous Every 24 hours 07/18/21 1203 07/18/21 1628   07/18/21 1200  ceFEPIme (MAXIPIME) 2 g in sodium chloride 0.9 % 100 mL IVPB  Status:  Discontinued        2 g 200 mL/hr over 30 Minutes Intravenous Every 12 hours 07/18/21 1155 07/18/21 1156   07/18/21 0930  ceFEPIme (MAXIPIME) 2 g in sodium chloride 0.9 % 100 mL IVPB        2 g 200 mL/hr over 30 Minutes Intravenous  Once 07/18/21 0918 07/18/21 1135   07/18/21 0930  metroNIDAZOLE (FLAGYL) IVPB 500 mg  Status:  Discontinued        500 mg 100 mL/hr over 60 Minutes Intravenous  Once 07/18/21 [id number] 07/18/21 1534        Assessment/Plan 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 Arnoldo Morale at [place] on 8/12 - path w/ adenocarcinoma. Oncology has seen on 9/5 - Midline wound opened by Dr. Arnoldo Morale at bedside 8/24 and now has stool decompressing through CC fistula  - Currently has eakin's pouch. WOCN following.  - Minimal output since 9/10 and having bm's. Will hold at full liquid diet since there is still some output.  - Low threshold to repeat CT if develops abdominal pain/tenderness, leukocytosis, or fever   FEN - FLD. TPN. IVF per primary VTE - SCDs ID - WBC normalized. Afebrile. None  currently.  COVID + A. Fib on Xarelto at home  ABL anemia - FOBT positive 8/24. Monitor. Heparin currently held. Hgb stable on today's labs  Hypothyroidism Severe protein calorie malnutrition - albumin 1.3. Pre-alb <5. On TNA   LOS: 21 days     Milus Height, MD FACS Surgical Oncology, General Surgery, Trauma and Angus Surgery, [place] for weekday/non holidays Check [email or web address] for coverage night/weekend/holidays  Do not use SecureChat as it is not reliable for timely patient care.

## 2021-08-08 NOTE — Progress Notes (Signed)
Physical Therapy Treatment Patient Details Name: Lauren [name] MRN: [id number] DOB: 1946-06-29 Today's Date: 08/08/2021   History of Present Illness 75 yo female admitted [id number] from SNF [facility]) with hypotension and Covid (+). Pt with colocutaneous fistula, Afib and new CHf since admission. Pt also with abdominal wall cellulitis and left thigh hematoma requiring blood transfusion. Pt with recent admission (8/9-8/23) to [facility] with partially obstructing tumor in the ascending colon s/p R hemicolectomy 07/06/21. PMH: A fib, morbid obesity, hypertension, lymphedema, hypothyroidism    PT Comments    Pt in bed on arrival, anxious regarding personal matters (I.e. mortgage, utilities). Pt declining OOB but agreeable to bed mobility for better positioning and exercises. Pt required min assist rolling R/L, and +2 max assist scooting up in bed. She performed LE exercises and agreed to independently complete exercises again later today. Pt without c/o pain. Vitals remained stable on RA during session. Current POC remains appropriate.    Recommendations for follow up therapy are one component of a multi-disciplinary discharge planning process, led by the attending physician.  Recommendations may be updated based on patient status, additional functional criteria and insurance authorization.  Follow Up Recommendations  SNF;Supervision/Assistance - 24 hour     Equipment Recommendations  None recommended by PT    Recommendations for Other Services       Precautions / Restrictions Precautions Precautions: Fall;Other (comment) Precaution Comments: painful LLE, bil LE edema, LUE cellulitis, ostomy to suction     Mobility  Bed Mobility Overal bed mobility: Needs Assistance Bed Mobility: Rolling Rolling: Min assist         General bed mobility comments: min assist rolling R/L with pt using bedrail, +2 max assist scooting up in bed using bed pad. Pt able to minimally assist by pulling with BUE on  upper bedrails.    Transfers                    Ambulation/Gait                 Stairs             Wheelchair Mobility    Modified Rankin (Stroke Patients Only)       Balance                                            Cognition Arousal/Alertness: Awake/alert Behavior During Therapy: WFL for tasks assessed/performed;Anxious Overall Cognitive Status: No family/caregiver present to determine baseline cognitive functioning                                 General Comments: patient concerned with personel issues; morgage, utilities      Exercises General Exercises - Upper Extremity Shoulder Flexion: AROM;10 reps;Supine Shoulder ABduction: AROM;10 reps;Supine General Exercises - Lower Extremity Ankle Circles/Pumps: AROM;Both;10 reps;Supine Quad Sets: AROM;Both;10 reps;Supine Gluteal Sets: AROM;Supine;Both;10 reps    General Comments        Pertinent Vitals/Pain Pain Assessment: No/denies pain    Home Living                      Prior Function            PT Goals (current goals can now be found in the care plan section) Acute Rehab PT Goals Patient Stated Goal: to  get better Progress towards PT goals: Progressing toward goals    Frequency    Min 2X/week      PT Plan Current plan remains appropriate    Co-evaluation PT/OT/SLP Co-Evaluation/Treatment: Yes Reason for Co-Treatment: Complexity of the patient's impairments (multi-system involvement);To address functional/ADL transfers PT goals addressed during session: Mobility/safety with mobility OT goals addressed during session: Strengthening/ROM      AM-PAC PT "6 Clicks" Mobility   Outcome Measure  Help needed turning from your back to your side while in a flat bed without using bedrails?: A Lot Help needed moving from lying on your back to sitting on the side of a flat bed without using bedrails?: Total Help needed moving to and from  a bed to a chair (including a wheelchair)?: A Lot Help needed standing up from a chair using your arms (e.g., wheelchair or bedside chair)?: A Lot Help needed to walk in hospital room?: Total Help needed climbing 3-5 steps with a railing? : Total 6 Click Score: 9    End of Session   Activity Tolerance: Other (comment) (anxiety over personal issues: mortgage, utilities) Patient left: in bed;with call bell/phone within reach Nurse Communication: Mobility status PT Visit Diagnosis: Other abnormalities of gait and mobility (R26.89);Muscle weakness (generalized) (M62.81)     Time: [id number] PT Time Calculation (min) (ACUTE ONLY): 25 min  Charges:  $Therapeutic Exercise: 8-22 mins                     Lorrin Goodell, PT  Office # [phone] Pager [phone]    [name] 08/08/2021, 10:22 AM

## 2021-08-08 NOTE — Progress Notes (Signed)
PHARMACY - TOTAL PARENTERAL NUTRITION CONSULT NOTE  Indication: Fistula  Patient Measurements: Height: 5' 2"  (157.5 cm) Weight: (!) 139.6 kg (307 lb 12.2 oz) IBW/kg (Calculated) : 50.1 TPN AdjBW (KG): 66.6 Body mass index is 56.29 kg/m.  Assessment:  37 YOF with recent R-hemicolectomy on 8/12 in the setting of a cecal mass - consistent with R-colon metastatic adenocarcinoma. 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 Pharmacy consulted to manage TPN for nutritional support in the setting of a colocutaneous fistula.    Glucose / Insulin: CBGs controlled, SSI d/c'd 8/28 Electrolytes: Na 130, K 4.6 (goal >/=4 for Afib), Mag 2 (goal >/= 2 for Afib), others WNL Renal:  SCr < 1, BUN WNL Hepatic: LFTs / Tbili / TG WNL, alk phos stable 180, albumin 1.4 Intake / Output; MIVF: Fistula output minimal - none documented in last 24hrs, UOP 0.19m/kg/hr. LBM 9/13  GI Imaging: 8/24 Abd CT: colocutaneous fistula, 7.4cm abd wall abscess, soft tissue edema, cellulitis vs nec fasciitis 8/25 Abd xray: diffuse abd wall emphysema, nonobstructive bowel gas pattern 9/4 CT abd/pelvis: medial upper left thigh hematoma  [facility] 8/24: Midline wound opened by Dr. JArnoldo Moraleat bedside  Central access: PICC replaced 07/27/21 TPN start date: 07/19/21  Nutritional Goals (RD assessment on 8/25): 2300-2500 kcal, 140-160g AA, fluid >/= 2L per day  Current Nutrition:  TPN Advanced to FLD 9/13 Ensure BID - 1 dose charted yesterday  Plan:  Continue TPN at goal rate of 105 mL/hr at 1800 tonight, providing 151g AA and 2373 kCal meeting 100% of estimated needs Electrolytes in TPN: increase Na to 140 mEq/L, K 45 mEq/L, Ca 43m/L, Mg 1569mL, Phos 23m38mL, Cl:Ac 1:1 Add standard MVI and trace elements to TPN Monitor standard TPN labs on Monday/Thursday F/u Surgery plans for diet advancement and ability to wean  TPN   HaleArturo MortonarmD, BCPS Please check AMION for all [facility] PMcCombtact numbers Clinical Pharmacist 08/08/2021 7:58 AM

## 2021-08-09 DIAGNOSIS — C189 Malignant neoplasm of colon, unspecified: Secondary | ICD-10-CM | POA: Diagnosis not present

## 2021-08-09 DIAGNOSIS — L03311 Cellulitis of abdominal wall: Secondary | ICD-10-CM | POA: Diagnosis not present

## 2021-08-09 DIAGNOSIS — A419 Sepsis, unspecified organism: Secondary | ICD-10-CM | POA: Diagnosis not present

## 2021-08-09 DIAGNOSIS — N179 Acute kidney failure, unspecified: Secondary | ICD-10-CM | POA: Diagnosis not present

## 2021-08-09 LAB — COMPREHENSIVE METABOLIC PANEL
ALT: 28 U/L (ref 0–44)
AST: 40 U/L (ref 15–41)
Albumin: 1.5 g/dL — ABNORMAL LOW (ref 3.5–5.0)
Alkaline Phosphatase: 195 U/L — ABNORMAL HIGH (ref 38–126)
Anion gap: 4 — ABNORMAL LOW (ref 5–15)
BUN: 21 mg/dL (ref 8–23)
CO2: 26 mmol/L (ref 22–32)
Calcium: 7.8 mg/dL — ABNORMAL LOW (ref 8.9–10.3)
Chloride: 100 mmol/L (ref 98–111)
Creatinine, Ser: 0.5 mg/dL (ref 0.44–1.00)
GFR, Estimated: >60 mL/min (ref >60)
Glucose, Bld: 85 mg/dL (ref 70–99)
Potassium: 5 mmol/L (ref 3.5–5.1)
Sodium: 130 mmol/L — ABNORMAL LOW (ref 135–145)
Total Bilirubin: 0.8 mg/dL (ref 0.3–1.2)
Total Protein: 6.6 g/dL (ref 6.5–8.1)

## 2021-08-09 LAB — CBC
HCT: 24.8 % — ABNORMAL LOW (ref 36.0–46.0)
Hemoglobin: 7.8 g/dL — ABNORMAL LOW (ref 12.0–15.0)
MCH: 32.2 pg (ref 26.0–34.0)
MCHC: 31.5 g/dL (ref 30.0–36.0)
MCV: 102.5 fL — ABNORMAL HIGH (ref 80.0–100.0)
Platelets: 223 10*3/uL (ref 150–400)
RBC: 2.42 MIL/uL — ABNORMAL LOW (ref 3.87–5.11)
RDW: 25.2 % — ABNORMAL HIGH (ref 11.5–15.5)
WBC: 8.3 10*3/uL (ref 4.0–10.5)
nRBC: 0.5 % — ABNORMAL HIGH (ref 0.0–0.2)

## 2021-08-09 LAB — PHOSPHORUS: Phosphorus: 3.5 mg/dL (ref 2.5–4.6)

## 2021-08-09 LAB — MAGNESIUM: Magnesium: 2 mg/dL (ref 1.7–2.4)

## 2021-08-09 MED ORDER — ALTEPLASE 2 MG IJ SOLR
2.0000 mg | Freq: Once | INTRAMUSCULAR | Status: AC
  Administered 2021-08-09: 2 mg
  Filled 2021-08-09: qty 2

## 2021-08-09 MED ORDER — LORAZEPAM 1 MG PO TABS
1.0000 mg | ORAL_TABLET | Freq: Four times a day (QID) | ORAL | Status: DC | PRN
  Administered 2021-08-10 – 2021-09-06 (×31): 1 mg via ORAL
  Filled 2021-08-09 (×32): qty 1

## 2021-08-09 MED ORDER — TRAVASOL 10 % IV SOLN
INTRAVENOUS | Status: AC
  Filled 2021-08-09: qty 1512

## 2021-08-09 MED ORDER — AMIODARONE HCL 200 MG PO TABS
200.0000 mg | ORAL_TABLET | Freq: Every day | ORAL | Status: DC
  Administered 2021-08-09 – 2021-09-07 (×29): 200 mg via ORAL
  Filled 2021-08-09 (×29): qty 1

## 2021-08-09 NOTE — TOC Progression Note (Signed)
Transition of Care [facility]) - Progression Note    Patient Details  Name: Lauren [name] MRN: [id number] Date of Birth: 04/18/1946  Transition of Care [facility]) CM/SW Contact  Joanne Chars, LCSW Phone Number: 08/09/2021, 1:21 PM  Clinical Narrative:   CSW spoke with Mardene Celeste at [facility], who had denied referral in hub for several reasons.  After discussing, Mardene Celeste willing to reconsider pt once she is off TPN.  CSW will call her back at that point.      Expected Discharge Plan: [place] Barriers to Discharge: Continued Medical Work up, SNF Pending bed offer  Expected Discharge Plan and Services Expected Discharge Plan: [place] arrangements for the past 2 months: Single Family Home                                       Social Determinants of Health (SDOH) Interventions    Readmission Risk Interventions Readmission Risk Prevention Plan 07/12/2021  Transportation Screening Complete  Home Care Screening Complete  Medication Review (RN CM) Complete  Some recent data might be hidden

## 2021-08-09 NOTE — Progress Notes (Signed)
Nurse attempted to draw from PICC but there was no blood return consulted IV team. Will draw labs once issue is resolved.

## 2021-08-09 NOTE — Progress Notes (Signed)
PROGRESS NOTE    THIERRY GORZYNSKI  [id number] DOB: 05/22/1946 DOA: 07/18/2021 PCP: [name], No    Brief Narrative:   Lauren [name] is a 75 year old female with past medical history of paroxysmal atrial fibrillation, chronic combined systolic and diastolic heart failure, hypertension, lymphedema, obesity presented to the hospital from a skilled nursing facility with hypotension, she also had nausea abdominal pain back pain.  CT scan of the abdomen pelvis showed the fistula extending from the bowel into the umbilical hernia with evidence of fluid collection and gas collection concerning for necrotizing fasciitis.  Patient was initially admitted to  [facility] for sepsis secondary to colocutaneous fistula with surrounding cellulitis with possible necrotizing fasciitis of abdominal wall.  Patient was started on empiric antibiotics with  meropenem, vancomycin, anidulafungin, and norepinephrine for vasopressor support.  General surgery was consulted.  Patient was then transferred from [facility] to [facility], ICU.  Patient was started on TPN 8/25.  Transferred from PCCM service to [facility] on 8/27.  Assessment & Plan:   Principal Problem:   Septic shock ([place]) Active Problems:   Hypokalemia   Morbid obesity with BMI of 50.0-59.9, adult (HCC)   Colon carcinoma ([place])   Enterocutaneous fistula   Cellulitis   AKI (acute kidney injury) ([place])   Hyponatremia  Septic shock secondary to Colocutaneous fistula with surrounding cellulitis/abscess Resolved.  Was on Levophed which has been titrated up.  Has completed course of antibiotics.  On TPN.  Full liquid diet has been initiated.  Acute blood loss anemia secondary to Left thigh hematoma CT scan abdomen/pelvis notable for left thigh hematoma.  Patient was seen by orthopedic Dr. Ninfa Linden.  No recommendation for surgical intervention at this time.  Patient received blood transfusion during hospitalization.    Atrial fibrillation with  RVR She was seen by cardiology.  Was on Xarelto as outpatient.  This has been discontinued at this time.  On amiodarone drip but will transition to p.o.  Currently holding anticoagulation.    Chronic combined systolic/diastolic congestive heart failure. Continue strict intake and output charting, daily weights.  TTE 06/29/2021 in with LVEF 30%, mild/moderate MVR, LA severely dilated.  Was seen by cardiology, not a candidate for invasive or aggressive intervention.  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  Seen by medical oncology, Dr. Chryl Heck on 07/30/2021.  No distant metastasis.  She will follow-up with medical oncology at [facility].  Acute kidney injury: Resolved Thought to be secondary to ATN from septic shock.  Creatinine has trended down to 0.5.  Hypothyroidism: Levothyroxine 50 mcg IV daily, will change synthroid to PO.  Acute delirium: Resolved  Anxiety.  Requesting Ativan.  We will add closely monitor   GERD:  Continue PPI  COVID-19 viral infection, incidental. Completed course of remdesivir.   Hyponatremia Continue to monitor.  Hypokalemia Has resolved after replacement.  Monitor BMP closely.  Hypomagnesmia Replenished and has improved.  Weakness/debility/deconditioning: Physical therapy has recommended SNF placement.  Morbid obesity Body mass index is 56.94 kg/m.  Would benefit from ongoing weight loss as outpatient  Goals of care Patient was seen by palliative care.  CODE STATUS changed to DNR/DNI.  DVT prophylaxis: Place and maintain sequential compression device Start: 07/18/21 1202 Place TED hose Start: 07/18/21 1202    Code Status: DNR  Family Communication:  None.  Disposition Plan:   Status is: Inpatient  Remains inpatient appropriate because:Unsafe d/c plan, IV treatments appropriate due to intensity of  illness or inability to take PO, and Inpatient level of care appropriate due to  severity of illness  Dispo: The patient is from: Home              Anticipated d/c is to: SNF              Patient currently is not medically stable to d/c.   Difficult to place patient No  Consultants:  PCCM - signed off 8/27 General surgery Palliative care Cardiology  -  signed off 9/9  Procedures:  PICC line placement.  Antimicrobials:  None at present  Subjective: Today, patient was seen and examined at bedside. Complains of left leg pain, uncomfortable, feels anxious.  Objective: Vitals:   08/09/21 0013 08/09/21 0021 08/09/21 0400 08/09/21 0500  BP:  (!) 110/57 (!) 102/55   Pulse:  95 95   Resp: (!) 26 (!) 21 (!) 23   Temp:  97.6 F (36.4 C) 97.8 F (36.6 C)   TempSrc:  Oral Oral   SpO2:  96%    Weight:    (!) 141.2 kg  Height:        Intake/Output Summary (Last 24 hours) at 08/09/2021 0859 Last data filed at 08/09/2021 [id number] Gross per 24 hour  Intake 1514.27 ml  Output 1550 ml  Net -35.73 ml    Filed Weights   08/07/21 0430 08/08/21 0543 08/09/21 0500  Weight: 135.5 kg (!) 139.6 kg (!) 141.2 kg    Physical examination: General:  Morbidly obese, not in obvious distress, anxious, chronic ill HENT:   No scleral pallor or icterus noted. Oral mucosa is moist.  Chest:  Clear breath sounds.  Diminished breath sounds bilaterally. No crackles or wheezes.  CVS: S1 &S2 heard. No murmur.  Regular rate and rhythm. Abdomen: Soft, nontender, nondistended.  Bowel sounds are heard.  Ostomy noted, obese Extremities: No cyanosis, clubbing or gross lymphedema of the lower extremities.  Peripheral pulses are palpable. Psych: Alert, awake and oriented, normal mood CNS:  No cranial nerve deficits.  Power equal in all extremities.   Skin: Warm and dry.   Data Reviewed: I have personally reviewed the following labs and imaging studies,   CBC: Recent Labs  Lab 09-04-21 0438 2021-09-04 1940 08/06/21 0419 08/07/21 0600 08/08/21 0500  WBC 9.0 9.4 8.9 10.4 12.7*  NEUTROABS  5.3 5.7 5.5 7.7 8.7*  HGB 7.7* 8.4* 7.6* 8.0* 7.6*  HCT 26.0* 28.8* 25.7* 27.2* 25.2*  MCV 90.6 91.7 92.1 93.5 91.3  PLT 202 244 207 236 [id number]    Basic Metabolic Panel: Recent Labs  Lab 08/04/21 0600 04-Sep-2021 0438 08/06/21 0419 08/07/21 0600 08/08/21 0500  NA 140 140 138 133* 130*  K 3.9 4.1 4.2 4.5 4.6  CL 107 110 107 102 98  CO2 '29 27 26 27 26  '$ GLUCOSE 74 80 91 94 100*  BUN 25* 25* '23 22 20  '$ CREATININE 0.54 0.50 0.49 0.49 0.52  CALCIUM 7.9* 7.9* 7.9* 7.7* 7.7*  MG 1.9 2.0 2.1 1.9 2.0  PHOS 3.3 3.1 3.3 3.2 3.0    GFR: Estimated Creatinine Clearance: 86.8 mL/min (by C-G formula based on SCr of 0.52 mg/dL). Liver Function Tests: Recent Labs  Lab 08/04/21 0600 04-Sep-2021 0438 08/06/21 0419 08/07/21 0600 08/08/21 0500  AST 38 35 40 45* 36  ALT '25 23 27 '$ 32 28  ALKPHOS 210* 172* 186* 167* 180*  BILITOT 0.9 0.9 0.7 1.0 1.0  PROT 5.5* 5.6* 5.5* 5.9* 5.7*  ALBUMIN 1.2* 1.3* 1.3* 1.4*  1.4*    No results for input(s): LIPASE, AMYLASE in the last 168 hours. No results for input(s): AMMONIA in the last 168 hours. Coagulation Profile: Recent Labs  Lab 08/08/21 0500  INR 1.2    Cardiac Enzymes: No results for input(s): CKTOTAL, CKMB, CKMBINDEX, TROPONINI in the last 168 hours. BNP (last 3 results) No results for input(s): PROBNP in the last 8760 hours. HbA1C: No results for input(s): HGBA1C in the last 72 hours. CBG: No results for input(s): GLUCAP in the last 168 hours. Lipid Profile: No results for input(s): CHOL, HDL, LDLCALC, TRIG, CHOLHDL, LDLDIRECT in the last 72 hours.  Thyroid Function Tests: No results for input(s): TSH, T4TOTAL, FREET4, T3FREE, THYROIDAB in the last 72 hours. Anemia Panel: No results for input(s): VITAMINB12, FOLATE, FERRITIN, TIBC, IRON, RETICCTPCT in the last 72 hours. Sepsis Labs: No results for input(s): PROCALCITON, LATICACIDVEN in the last 168 hours.  No results found for this or any previous visit (from the past 240 hour(s)).     Radiology Studies: No results found.    Scheduled Meds:  alteplase  2 mg Intracatheter Once   Chlorhexidine Gluconate Cloth  6 each Topical Daily   feeding supplement  237 mL Oral BID BM   levothyroxine  50 mcg Intravenous Daily   mouth rinse  15 mL Mouth Rinse BID   pantoprazole  40 mg Oral Q0600   sodium chloride flush  10-40 mL Intracatheter Q12H   Continuous Infusions:  amiodarone 30 mg/hr (08/09/21 0811)   methocarbamol (ROBAXIN) IV 500 mg (07/25/21 2343)   TPN ADULT (ION) 105 mL/hr at 08/08/21 1746     LOS: 22 days    Flora Lipps, MD [facility] Available via Epic secure chat 7am-7pm After these hours, please refer to coverage provider listed on [email or web address] 08/09/2021, 8:59 AM

## 2021-08-09 NOTE — Progress Notes (Signed)
PHARMACY - TOTAL PARENTERAL NUTRITION CONSULT NOTE  Indication: Fistula  Patient Measurements: Height: 5' 2"  (157.5 cm) Weight: (!) 141.2 kg (311 lb 4.6 oz) IBW/kg (Calculated) : 50.1 TPN AdjBW (KG): 66.6 Body mass index is 56.94 kg/m.  Assessment:  55 YOF with recent R-hemicolectomy on 8/12 in the setting of a cecal mass - consistent with R-colon metastatic adenocarcinoma. 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 Pharmacy consulted to manage TPN for nutritional support in the setting of a colocutaneous fistula.    Glucose / Insulin: CBGs controlled, SSI d/c'd 8/28 Electrolytes: last labs 9/14 (IV Team unable to draw from PICC and patient refusing peripheral stick 9/15) - Na 130, K 4.6 (goal >/=4 for Afib), Mag 2 (goal >/= 2 for Afib), others WNL Renal:  SCr < 1, BUN WNL Hepatic: LFTs / Tbili / TG WNL, alk phos stable 180, albumin 1.4 Intake / Output; MIVF: Fistula output minimal - none documented in last 24hrs, UOP 0.60m/kg/hr. LBM 9/14  GI Imaging: 8/24 Abd CT: colocutaneous fistula, 7.4cm abd wall abscess, soft tissue edema, cellulitis vs nec fasciitis 8/25 Abd xray: diffuse abd wall emphysema, nonobstructive bowel gas pattern 9/4 CT abd/pelvis: medial upper left thigh hematoma  GI Surgeries / [facility]-hem[facility] 8/24: Midline wound opened by Dr. JArnoldo Moraleat bedside  Central access: PICC replaced 07/27/21 TPN start date: 07/19/21  Nutritional Goals (RD assessment on 8/25): 2300-2500 kcal, 140-160g AA, fluid >/= 2L per day  Current Nutrition:  TPN Advanced to FLD 9/13 - continued 9/14 with patient still having some output from eakins pouch Ensure BID - all doses charted yesterday  Plan:  Continue TPN at goal rate of 105 mL/hr at 1800 tonight, providing 151g AA and 2373 kCal meeting 100% of estimated needs Electrolytes in TPN: Na to 140 mEq/L, K 45 mEq/L, Ca 48m/L, Mg 1559mL, Phos 69m35mL,  Cl:Ac 1:1 Add standard MVI and trace elements to TPN Monitor standard TPN labs on Monday/Thursday and PRN - will attempt to get labs again in AM F/u Surgery plans for diet advancement and ability to wean TPN   HaleArturo MortonarmD, BCPS Please check AMION for all [facility] PAlexandertact numbers Clinical Pharmacist 08/09/2021 8:59 AM

## 2021-08-09 NOTE — Progress Notes (Signed)
[facility] Progress Note     Subjective: CC-  No new complaints this morning. Abdomen feels the same. Tolerating full liquids but not eating much. Drank Boost x1 yesterday. Minimal output from fistula.  Objective: Vital signs in last 24 hours: Temp:  [97.6 F (36.4 C)-98.6 F (37 C)] 97.8 F (36.6 C) (09/15 0400) Pulse Rate:  [95-102] 95 (09/15 0400) Resp:  [18-27] 23 (09/15 0400) BP: (94-112)/(43-68) 102/55 (09/15 0400) SpO2:  [96 %-97 %] 96 % (09/15 0021) Weight:  [141.2 kg] 141.2 kg (09/15 0500) Last BM Date: 08/08/21  Intake/Output from previous day: 09/14 0701 - 09/15 0700 In: 1514.3 [P.O.:481; I.V.:1033.3] Out: 1550 [Urine:1550] Intake/Output this shift: No intake/output data recorded.  PE: Gen:  Alert, NAD, pleasant Pulm:  rate and effort normal Abd: obese, soft, ND, NT, +BS, eakins pouch w/ small amount of tan output  Lab Results:  Recent Labs    08/07/21 0600 08/08/21 0500  WBC 10.4 12.7*  HGB 8.0* 7.6*  HCT 27.2* 25.2*  PLT 236 226   BMET Recent Labs    08/07/21 0600 08/08/21 0500  NA 133* 130*  K 4.5 4.6  CL 102 98  CO2 27 26  GLUCOSE 94 100*  BUN 22 20  CREATININE 0.49 0.52  CALCIUM 7.7* 7.7*   PT/INR Recent Labs    08/08/21 0500  LABPROT 15.6*  INR 1.2   CMP     Component Value Date/Time   NA 130 (L) 08/08/2021 0500   K 4.6 08/08/2021 0500   CL 98 08/08/2021 0500   CO2 26 08/08/2021 0500   GLUCOSE 100 (H) 08/08/2021 0500   BUN 20 08/08/2021 0500   CREATININE 0.52 08/08/2021 0500   CALCIUM 7.7 (L) 08/08/2021 0500   PROT 5.7 (L) 08/08/2021 0500   ALBUMIN 1.4 (L) 08/08/2021 0500   AST 36 08/08/2021 0500   ALT 28 08/08/2021 0500   ALKPHOS 180 (H) 08/08/2021 0500   BILITOT 1.0 08/08/2021 0500   GFRNONAA >60 08/08/2021 0500   GFRAA >90 03/28/2014 0435   Lipase     Component Value Date/Time   LIPASE 25 03/27/2014 0600       Studies/Results: No results found.  Anti-infectives: Anti-infectives (From  admission, onward)    Start     Dose/Rate Route Frequency Ordered Stop   07/20/21 1000  remdesivir 100 mg in sodium chloride 0.9 % 100 mL IVPB       See Hyperspace for full Linked Orders Report.   100 mg 200 mL/hr over 30 Minutes Intravenous Daily 07/19/21 1040 07/21/21 1148   07/20/21 0200  vancomycin (VANCOREADY) IVPB 750 mg/150 mL  Status:  Discontinued        750 mg 150 mL/hr over 60 Minutes Intravenous Every 24 hours 07/19/21 1036 07/19/21 1449   07/19/21 2300  anidulafungin (ERAXIS) 100 mg in sodium chloride 0.9 % 100 mL IVPB  Status:  Discontinued        100 mg 78 mL/hr over 100 Minutes Intravenous Every 24 hours 07/18/21 2107 07/19/21 1449   07/19/21 1600  vancomycin (VANCOREADY) IVPB 750 mg/150 mL  Status:  Discontinued        750 mg 150 mL/hr over 60 Minutes Intravenous Every 24 hours 07/19/21 0919 07/19/21 1016   07/19/21 1600  vancomycin (VANCOREADY) IVPB 750 mg/150 mL  Status:  Discontinued        750 mg 150 mL/hr over 60 Minutes Intravenous Every 24 hours 07/19/21 1016 07/19/21 1036   07/19/21 1400  piperacillin-tazobactam (  ZOSYN) IVPB 3.375 g  Status:  Discontinued        3.375 g 12.5 mL/hr over 240 Minutes Intravenous Every 8 hours 07/19/21 1016 07/31/21 1501   07/19/21 1130  remdesivir 200 mg in sodium chloride 0.9% 250 mL IVPB       See Hyperspace for full Linked Orders Report.   200 mg 580 mL/hr over 30 Minutes Intravenous Once 07/19/21 1040 07/19/21 1241   07/19/21 0100  metroNIDAZOLE (FLAGYL) IVPB 500 mg  Status:  Discontinued        500 mg 100 mL/hr over 60 Minutes Intravenous Every 12 hours 07/18/21 1203 07/18/21 1534   07/18/21 2200  ceFEPIme (MAXIPIME) 2 g in sodium chloride 0.9 % 100 mL IVPB  Status:  Discontinued        2 g 200 mL/hr over 30 Minutes Intravenous Every 12 hours 07/18/21 1156 07/18/21 1203   07/18/21 2200  meropenem (MERREM) 2 g in sodium chloride 0.9 % 100 mL IVPB  Status:  Discontinued        2 g 200 mL/hr over 30 Minutes Intravenous Every  12 hours 07/18/21 2104 07/19/21 1011   07/18/21 2200  anidulafungin (ERAXIS) 200 mg in sodium chloride 0.9 % 200 mL IVPB        200 mg 78 mL/hr over 200 Minutes Intravenous  Once 07/18/21 2108 07/19/21 0419   07/18/21 1800  clindamycin (CLEOCIN) IVPB 900 mg  Status:  Discontinued        900 mg 100 mL/hr over 30 Minutes Intravenous Every 8 hours 07/18/21 1534 07/18/21 2103   07/18/21 1600  vancomycin (VANCOREADY) IVPB 2000 mg/400 mL  Status:  Discontinued        2,000 mg 200 mL/hr over 120 Minutes Intravenous Every 24 hours 07/18/21 1544 07/19/21 0919   07/18/21 1545  piperacillin-tazobactam (ZOSYN) IVPB 3.375 g  Status:  Discontinued        3.375 g 12.5 mL/hr over 240 Minutes Intravenous Every 8 hours 07/18/21 1544 07/18/21 2104   07/18/21 1300  cefTRIAXone (ROCEPHIN) 2 g in sodium chloride 0.9 % 100 mL IVPB  Status:  Discontinued        2 g 200 mL/hr over 30 Minutes Intravenous Every 24 hours 07/18/21 1203 07/18/21 1628   07/18/21 1200  ceFEPIme (MAXIPIME) 2 g in sodium chloride 0.9 % 100 mL IVPB  Status:  Discontinued        2 g 200 mL/hr over 30 Minutes Intravenous Every 12 hours 07/18/21 1155 07/18/21 1156   07/18/21 0930  ceFEPIme (MAXIPIME) 2 g in sodium chloride 0.9 % 100 mL IVPB        2 g 200 mL/hr over 30 Minutes Intravenous  Once 07/18/21 0918 07/18/21 1135   07/18/21 0930  metroNIDAZOLE (FLAGYL) IVPB 500 mg  Status:  Discontinued        500 mg 100 mL/hr over 60 Minutes Intravenous  Once 07/18/21 [id number] 07/18/21 1534        Assessment/Plan 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 Arnoldo Morale at [facility] on 8/12 - path w/ adenocarcinoma. Oncology has seen on 9/5 - Midline wound opened by Dr. Arnoldo Morale at bedside 8/24 and now has stool decompressing through CC fistula  - Currently has eakin's pouch. I think [facility] is following - will reach out to see when next pouch change is so I can take a closer look at her fistula - Minimal output since 9/10  and having bm's. Will  hold at full liquid diet since there is still some output and continue TPN - Low threshold to repeat CT if develops abdominal pain/tenderness, leukocytosis, or fever - she has no pain or tenderness today, CBC pending   FEN - FLD. TPN. IVF per primary VTE - SCDs, anticoagulation currently on hold  ID - None currently.    COVID + A. Fib on Xarelto at home  ABL anemia - FOBT positive 8/24 Hypothyroidism Severe protein calorie malnutrition - albumin 1.3. Pre-alb <5. On TNA   LOS: 22 days    Wellington Hampshire, [facility] Surgery 08/09/2021, 9:51 AM Please see Amion for pager number during day hours 7:00am-4:30pm

## 2021-08-10 DIAGNOSIS — L03311 Cellulitis of abdominal wall: Secondary | ICD-10-CM | POA: Diagnosis not present

## 2021-08-10 DIAGNOSIS — N179 Acute kidney failure, unspecified: Secondary | ICD-10-CM | POA: Diagnosis not present

## 2021-08-10 DIAGNOSIS — A419 Sepsis, unspecified organism: Secondary | ICD-10-CM | POA: Diagnosis not present

## 2021-08-10 DIAGNOSIS — C189 Malignant neoplasm of colon, unspecified: Secondary | ICD-10-CM | POA: Diagnosis not present

## 2021-08-10 LAB — MAGNESIUM: Magnesium: 2 mg/dL (ref 1.7–2.4)

## 2021-08-10 LAB — BASIC METABOLIC PANEL
Anion gap: 4 — ABNORMAL LOW (ref 5–15)
BUN: 23 mg/dL (ref 8–23)
CO2: 26 mmol/L (ref 22–32)
Calcium: 7.6 mg/dL — ABNORMAL LOW (ref 8.9–10.3)
Chloride: 100 mmol/L (ref 98–111)
Creatinine, Ser: 0.49 mg/dL (ref 0.44–1.00)
GFR, Estimated: >60 mL/min (ref >60)
Glucose, Bld: 79 mg/dL (ref 70–99)
Potassium: 4.6 mmol/L (ref 3.5–5.1)
Sodium: 130 mmol/L — ABNORMAL LOW (ref 135–145)

## 2021-08-10 LAB — PHOSPHORUS: Phosphorus: 3.5 mg/dL (ref 2.5–4.6)

## 2021-08-10 MED ORDER — TRAVASOL 10 % IV SOLN
INTRAVENOUS | Status: AC
  Filled 2021-08-10: qty 1512

## 2021-08-10 MED ORDER — DEXTROSE 10 % IV SOLN: INTRAVENOUS | Status: AC

## 2021-08-10 MED ORDER — IOHEXOL 9 MG/ML PO SOLN
ORAL | Status: AC
  Filled 2021-08-10: qty 1000

## 2021-08-10 MED ORDER — DIPHENHYDRAMINE HCL 12.5 MG/5ML PO ELIX
12.5000 mg | ORAL_SOLUTION | Freq: Four times a day (QID) | ORAL | Status: DC | PRN
  Administered 2021-08-17 – 2021-08-24 (×4): 12.5 mg via ORAL
  Filled 2021-08-10 (×6): qty 5

## 2021-08-10 MED ORDER — LEVOTHYROXINE SODIUM 100 MCG PO TABS
100.0000 ug | ORAL_TABLET | Freq: Every day | ORAL | Status: DC
  Administered 2021-08-10 – 2021-08-12 (×3): 100 ug via ORAL
  Filled 2021-08-10 (×3): qty 1

## 2021-08-10 NOTE — Progress Notes (Addendum)
PROGRESS NOTE    Lauren [name]  [id number] DOB: 07/10/1946 DOA: 07/18/2021 PCP: [name], No    Brief Narrative:   Lauren [name] is a 75 year old female with past medical history of paroxysmal atrial fibrillation, chronic combined systolic and diastolic heart failure, hypertension, lymphedema, obesity presented to the hospital from a skilled nursing facility with hypotension, she also had nausea abdominal pain back pain.  CT scan of the abdomen pelvis showed the fistula extending from the bowel into the umbilical hernia with evidence of fluid collection and gas collection concerning for necrotizing fasciitis.  Patient was initially admitted to  [facility] for sepsis secondary to colocutaneous fistula with surrounding cellulitis with possible necrotizing fasciitis of abdominal wall.  Patient was started on empiric antibiotics with  meropenem, vancomycin, anidulafungin, and norepinephrine for vasopressor support.  General surgery was consulted.  Patient was then transferred from [facility] to [facility], ICU.  Patient was started on TPN 8/25.  Transferred from PCCM service to [facility] on 8/27.  Assessment & Plan:   Principal Problem:   Septic shock ([place]) Active Problems:   Hypokalemia   Morbid obesity with BMI of 50.0-59.9, adult (HCC)   Colon carcinoma ([place])   Enterocutaneous fistula   Cellulitis   AKI (acute kidney injury) ([place])   Hyponatremia  Septic shock secondary to Colocutaneous fistula with surrounding cellulitis/abscess Resolved.  Was on Levophed which has been titrated up.  Patient has completed course of antibiotics.  On TPN.  Full liquid diet has been initiated.  Acute blood loss anemia secondary to Left thigh hematoma CT scan abdomen/pelvis notable for left thigh hematoma.  Patient was seen by orthopedic Dr. Ninfa Linden.  No recommendation for surgical intervention at this time.  Patient received blood transfusion during hospitalization.    Atrial fibrillation  with RVR Patient was seen by cardiology.  Was on Xarelto as outpatient.  This has been discontinued at this time.  On amiodarone p.o.Marland Kitchen    Chronic combined systolic/diastolic congestive heart failure. Continue strict intake and output charting, daily weights.  TTE 06/29/2021 in with LVEF 30%, mild/moderate MVR, LA severely dilated.  Was seen by cardiology, not a candidate for invasive or aggressive intervention.  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  Seen by medical oncology, Dr. Chryl Heck on 07/30/2021.  No distant metastasis.  She will follow-up with medical oncology at [facility]  Acute kidney injury: Resolved Thought to be secondary to ATN from septic shock.  Creatinine has trended down to 0.5.  Hypothyroidism: Continue Synthroid  Acute delirium: Resolved  Anxiety.  Much better with Ativan.  GERD:  Continue PPI  COVID-19 viral infection, incidental. Completed course of remdesivir.   Hyponatremia Continue to monitor.  Latest sodium of 130.  Hypokalemia Improved.  Potassium of 4.6  Hypomagnesmia Replenished and improved.  Weakness/debility/deconditioning: Physical therapy has recommended SNF placement.  Morbid obesity Body mass index is 57.42 kg/m.  Would benefit from ongoing weight loss as outpatient  Goals of care Patient was seen by palliative care.   DNR/DNI.  DVT prophylaxis: Place and maintain sequential compression device Start: 07/18/21 1202 Place TED hose Start: 07/18/21 1202    Code Status: DNR  Family Communication:  None.  Disposition Plan:   Status is: Inpatient  Remains inpatient appropriate because:Unsafe d/c plan, IV treatments appropriate due to intensity of illness or inability to take PO, and Inpatient level of care appropriate due to severity of illness  Dispo: The patient is from:  Home              Anticipated d/c is to: SNF              Patient currently is not medically stable  to d/c.   Difficult to place patient No  Consultants:  PCCM - signed off 8/27 General surgery Palliative care Cardiology  -  signed off 9/9  Procedures:  PICC line placement.  Antimicrobials:  None at present  Subjective: Today, patient was seen and examined at bedside.  Patient feels much better with anxiety.  Feels much more calm  Objective: Vitals:   08/09/21 2105 08/10/21 0020 08/10/21 0408 08/10/21 0500  BP: (!) 139/33 (!) 94/57 (!) 85/46   Pulse: 90 93 89   Resp: '19 15 18   '$ Temp: 97.8 F (36.6 C)     TempSrc: Oral     SpO2: 98% 96% 97%   Weight:    (!) 142.4 kg  Height:        Intake/Output Summary (Last 24 hours) at 08/10/2021 1041 Last data filed at 08/10/2021 [id number] Gross per 24 hour  Intake 1378.96 ml  Output 500 ml  Net 878.96 ml    Filed Weights   08/08/21 0543 08/09/21 0500 08/10/21 0500  Weight: (!) 139.6 kg (!) 141.2 kg (!) 142.4 kg    Physical examination: General:  Morbidly obese, not in obvious distress, more comfortable today, chronic ill HENT:   No scleral pallor or icterus noted. Oral mucosa is moist.  Chest:  Clear breath sounds.  Diminished breath sounds bilaterally. No crackles or wheezes.  CVS: S1 &S2 heard. No murmur.  Regular rate and rhythm. Abdomen: Soft, nontender, nondistended.  Bowel sounds are heard.  Ostomy noted, obese Extremities: No cyanosis, clubbing or gross lymphedema of the lower extremities.  Peripheral pulses are palpable. Psych: Alert, awake and oriented, normal mood, appears more calm CNS:  No cranial nerve deficits.  Moving all extremities. Skin: Warm and dry.   Data Reviewed: I have personally reviewed the following labs and imaging studies,   CBC: Recent Labs  Lab August 30, 2021 0438 30-Aug-2021 1940 08/06/21 0419 08/07/21 0600 08/08/21 0500 08/09/21 1545  WBC 9.0 9.4 8.9 10.4 12.7* 8.3  NEUTROABS 5.3 5.7 5.5 7.7 8.7*  --   HGB 7.7* 8.4* 7.6* 8.0* 7.6* 7.8*  HCT 26.0* 28.8* 25.7* 27.2* 25.2* 24.8*  MCV 90.6  91.7 92.1 93.5 91.3 102.5*  PLT 202 244 207 236 226 [id number]    Basic Metabolic Panel: Recent Labs  Lab 08/06/21 0419 08/07/21 0600 08/08/21 0500 08/09/21 1820 08/10/21 0355  NA 138 133* 130* 130* 130*  K 4.2 4.5 4.6 5.0 4.6  CL 107 102 98 100 100  CO2 '26 27 26 26 26  '$ GLUCOSE 91 94 100* 85 79  BUN '23 22 20 21 23  '$ CREATININE 0.49 0.49 0.52 0.50 0.49  CALCIUM 7.9* 7.7* 7.7* 7.8* 7.6*  MG 2.1 1.9 2.0 2.0 2.0  PHOS 3.3 3.2 3.0 3.5 3.5    GFR: Estimated Creatinine Clearance: 87.3 mL/min (by C-G formula based on SCr of 0.49 mg/dL). Liver Function Tests: Recent Labs  Lab 08/30/21 0438 08/06/21 0419 08/07/21 0600 08/08/21 0500 08/09/21 1820  AST 35 40 45* 36 40  ALT 23 27 32 28 28  ALKPHOS 172* 186* 167* 180* 195*  BILITOT 0.9 0.7 1.0 1.0 0.8  PROT 5.6* 5.5* 5.9* 5.7* 6.6  ALBUMIN 1.3* 1.3* 1.4* 1.4* 1.5*    No results for input(s): LIPASE, AMYLASE in the  last 168 hours. No results for input(s): AMMONIA in the last 168 hours. Coagulation Profile: Recent Labs  Lab 08/08/21 0500  INR 1.2    Cardiac Enzymes: No results for input(s): CKTOTAL, CKMB, CKMBINDEX, TROPONINI in the last 168 hours. BNP (last 3 results) No results for input(s): PROBNP in the last 8760 hours. HbA1C: No results for input(s): HGBA1C in the last 72 hours. CBG: No results for input(s): GLUCAP in the last 168 hours. Lipid Profile: No results for input(s): CHOL, HDL, LDLCALC, TRIG, CHOLHDL, LDLDIRECT in the last 72 hours.  Thyroid Function Tests: No results for input(s): TSH, T4TOTAL, FREET4, T3FREE, THYROIDAB in the last 72 hours. Anemia Panel: No results for input(s): VITAMINB12, FOLATE, FERRITIN, TIBC, IRON, RETICCTPCT in the last 72 hours. Sepsis Labs: No results for input(s): PROCALCITON, LATICACIDVEN in the last 168 hours.  No results found for this or any previous visit (from the past 240 hour(s)).    Radiology Studies: No results found.    Scheduled Meds:  amiodarone  200 mg Oral  Daily   Chlorhexidine Gluconate Cloth  6 each Topical Daily   feeding supplement  237 mL Oral BID BM   iohexol       levothyroxine  100 mcg Oral QAC breakfast   mouth rinse  15 mL Mouth Rinse BID   pantoprazole  40 mg Oral Q0600   sodium chloride flush  10-40 mL Intracatheter Q12H   Continuous Infusions:  methocarbamol (ROBAXIN) IV 500 mg (07/25/21 2343)   TPN ADULT (ION) 105 mL/hr at 08/09/21 1723     LOS: 23 days    Flora Lipps, MD [facility] Available via Epic secure chat 7am-7pm After these hours, please refer to coverage provider listed on [email or web address] 08/10/2021, 10:41 AM

## 2021-08-10 NOTE — Progress Notes (Signed)
Patient is scheduled for repeat CT scan with oral contrast to be administered.  Patient reports that she cannot begin to drink the oral contrast because she's too tired.   This RN called CT and spoke with CT staff [place] to make her aware of patient's preference to not drink oral contrast at this time.  Will attempt again later to see if she is agreeable to drink contrast.

## 2021-08-10 NOTE — Progress Notes (Signed)
PHARMACY - TOTAL PARENTERAL NUTRITION CONSULT NOTE  Indication: Fistula  Patient Measurements: Height: 5' 2"  (157.5 cm) Weight: (!) 142.4 kg (313 lb 15 oz) IBW/kg (Calculated) : 50.1 TPN AdjBW (KG): 66.6 Body mass index is 57.42 kg/m.  Assessment:  Lauren [name] with recent R-hemicolectomy on 8/12 in the setting of a cecal mass - consistent with R-colon metastatic adenocarcinoma. 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 Pharmacy consulted to manage TPN for nutritional support in the setting of a colocutaneous fistula.    Glucose / Insulin: CBGs controlled on low end, SSI d/c'd 8/28 Electrolytes: Na 130 stable, K 4.6 stable (goal >/=4 for Afib), Mag 2 (goal >/= 2 for Afib), others WNL Renal:  SCr < 1, BUN WNL Hepatic: LFTs / Tbili / TG WNL, alk phos 195, albumin 1.4 Intake / Output; MIVF: Fistula output minimal - none documented in last 24hrs, UOP appears not accurately charted. LBM 9/14  GI Imaging: 8/24 Abd CT: colocutaneous fistula, 7.4cm abd wall abscess, soft tissue edema, cellulitis vs nec fasciitis 8/25 Abd xray: diffuse abd wall emphysema, nonobstructive bowel gas pattern 9/4 CT abd/pelvis: medial upper left thigh hematoma  GI Surgeries / Procedures: 8/12 at [facility]: R-hemicolectomy 8/24: Midline wound opened by Dr. Arnoldo Morale at bedside  Central access: PICC replaced 07/27/21 TPN start date: 07/19/21  Nutritional Goals (RD assessment on 8/25): 2300-2500 kcal, 140-160g AA, fluid >/= 2L per day  Current Nutrition:  TPN Advanced to FLD 9/13 - still some succus from fistula, hold on further advancement for now and possibly back to NPO depending on how patient progresses per Surgery Ensure BID - all doses charted yesterday  Plan:  Continue TPN at goal rate of 105 mL/hr at 1800 tonight, providing 151g AA and 2373 kCal meeting 100% of estimated needs Electrolytes in TPN: increase Na to 150 mEq/L, K 45 mEq/L, Ca 41mq/L, Mg 179m/L,  Phos 2265m/L, Cl:Ac 1:1 Add standard MVI and trace elements to TPN Monitor standard TPN labs on Monday/Thursday and PRN F/u Surgery plans for diet advancement and ability to wean TPN  *9/16 AM update - TPN found to be leaking and had to be disconnected and discarded this AM per discussion with RN. Per discussion with Surgery, will hang D10 at same rate as TPN (105 ml/hr) until new TPN bag at standard 1800 hang time is started   HalArturo MortonharmD, BCPS Please check AMION for all [facility] numbers Clinical Pharmacist 08/10/2021 8:31 AM

## 2021-08-10 NOTE — Progress Notes (Signed)
VAS/IV Team RN Rosalyn notified this RN that patient's TPN was found disconnected from PICC line. VAS/IV team RN discarded remaining TPN bag. TPN Pharmacist Rose Fillers Dohlen called and notified that TPN stopped due to above disconnection.  Pharmacist to reach out to Surgery team and place orders as needed.

## 2021-08-10 NOTE — Progress Notes (Signed)
[facility] Progress Note     Subjective: No acute changes and no new complaints this morning. Abdomen feels the same. Tolerating full liquids. Last BM 2 days ago. Minimal output from fistula.  Objective: Vital signs in last 24 hours: Temp:  [97.8 F (36.6 C)] 97.8 F (36.6 C) (09/15 2105) Pulse Rate:  [89-93] 89 (09/16 0408) Resp:  [15-19] 18 (09/16 0408) BP: (85-139)/(33-57) 85/46 (09/16 0408) SpO2:  [96 %-98 %] 97 % (09/16 0408) Weight:  [142.4 kg] 142.4 kg (09/16 0500) Last BM Date: 08/08/21  Intake/Output from previous day: 09/15 0701 - 09/16 0700 In: 2345.3 [P.O.:290; I.V.:2055.3] Out: 500 [Urine:500] Intake/Output this shift: Total I/O In: 10 [I.V.:10] Out: -   PE: Gen:  Alert, NAD, pleasant Pulm:  rate and effort normal Abd: obese, soft, ND, NT, +BS, eakins pouch w/ small amount of tan output  EC fistula 9/15:    Lab Results:  Recent Labs    08/08/21 0500 08/09/21 1545  WBC 12.7* 8.3  HGB 7.6* 7.8*  HCT 25.2* 24.8*  PLT 226 223    BMET Recent Labs    08/09/21 1820 08/10/21 0355  NA 130* 130*  K 5.0 4.6  CL 100 100  CO2 26 26  GLUCOSE 85 79  BUN 21 23  CREATININE 0.50 0.49  CALCIUM 7.8* 7.6*    PT/INR Recent Labs    08/08/21 0500  LABPROT 15.6*  INR 1.2    CMP     Component Value Date/Time   NA 130 (L) 08/10/2021 0355   K 4.6 08/10/2021 0355   CL 100 08/10/2021 0355   CO2 26 08/10/2021 0355   GLUCOSE 79 08/10/2021 0355   BUN 23 08/10/2021 0355   CREATININE 0.49 08/10/2021 0355   CALCIUM 7.6 (L) 08/10/2021 0355   PROT 6.6 08/09/2021 1820   ALBUMIN 1.5 (L) 08/09/2021 1820   AST 40 08/09/2021 1820   ALT 28 08/09/2021 1820   ALKPHOS 195 (H) 08/09/2021 1820   BILITOT 0.8 08/09/2021 1820   GFRNONAA >60 08/10/2021 0355   GFRAA >90 03/28/2014 0435   Lipase     Component Value Date/Time   LIPASE 25 03/27/2014 0600       Studies/Results: No results found.  Anti-infectives: Anti-infectives (From  admission, onward)    Start     Dose/Rate Route Frequency Ordered Stop   07/20/21 1000  remdesivir 100 mg in sodium chloride 0.9 % 100 mL IVPB       See Hyperspace for full Linked Orders Report.   100 mg 200 mL/hr over 30 Minutes Intravenous Daily 07/19/21 1040 07/21/21 1148   07/20/21 0200  vancomycin (VANCOREADY) IVPB 750 mg/150 mL  Status:  Discontinued        750 mg 150 mL/hr over 60 Minutes Intravenous Every 24 hours 07/19/21 1036 07/19/21 1449   07/19/21 2300  anidulafungin (ERAXIS) 100 mg in sodium chloride 0.9 % 100 mL IVPB  Status:  Discontinued        100 mg 78 mL/hr over 100 Minutes Intravenous Every 24 hours 07/18/21 2107 07/19/21 1449   07/19/21 1600  vancomycin (VANCOREADY) IVPB 750 mg/150 mL  Status:  Discontinued        750 mg 150 mL/hr over 60 Minutes Intravenous Every 24 hours 07/19/21 0919 07/19/21 1016   07/19/21 1600  vancomycin (VANCOREADY) IVPB 750 mg/150 mL  Status:  Discontinued        750 mg 150 mL/hr over 60 Minutes Intravenous Every 24 hours 07/19/21 1016 07/19/21  1036   07/19/21 1400  piperacillin-tazobactam (ZOSYN) IVPB 3.375 g  Status:  Discontinued        3.375 g 12.5 mL/hr over 240 Minutes Intravenous Every 8 hours 07/19/21 1016 07/31/21 1501   07/19/21 1130  remdesivir 200 mg in sodium chloride 0.9% 250 mL IVPB       See Hyperspace for full Linked Orders Report.   200 mg 580 mL/hr over 30 Minutes Intravenous Once 07/19/21 1040 07/19/21 1241   07/19/21 0100  metroNIDAZOLE (FLAGYL) IVPB 500 mg  Status:  Discontinued        500 mg 100 mL/hr over 60 Minutes Intravenous Every 12 hours 07/18/21 1203 07/18/21 1534   07/18/21 2200  ceFEPIme (MAXIPIME) 2 g in sodium chloride 0.9 % 100 mL IVPB  Status:  Discontinued        2 g 200 mL/hr over 30 Minutes Intravenous Every 12 hours 07/18/21 1156 07/18/21 1203   07/18/21 2200  meropenem (MERREM) 2 g in sodium chloride 0.9 % 100 mL IVPB  Status:  Discontinued        2 g 200 mL/hr over 30 Minutes Intravenous Every  12 hours 07/18/21 2104 07/19/21 1011   07/18/21 2200  anidulafungin (ERAXIS) 200 mg in sodium chloride 0.9 % 200 mL IVPB        200 mg 78 mL/hr over 200 Minutes Intravenous  Once 07/18/21 2108 07/19/21 0419   07/18/21 1800  clindamycin (CLEOCIN) IVPB 900 mg  Status:  Discontinued        900 mg 100 mL/hr over 30 Minutes Intravenous Every 8 hours 07/18/21 1534 07/18/21 2103   07/18/21 1600  vancomycin (VANCOREADY) IVPB 2000 mg/400 mL  Status:  Discontinued        2,000 mg 200 mL/hr over 120 Minutes Intravenous Every 24 hours 07/18/21 1544 07/19/21 0919   07/18/21 1545  piperacillin-tazobactam (ZOSYN) IVPB 3.375 g  Status:  Discontinued        3.375 g 12.5 mL/hr over 240 Minutes Intravenous Every 8 hours 07/18/21 1544 07/18/21 2104   07/18/21 1300  cefTRIAXone (ROCEPHIN) 2 g in sodium chloride 0.9 % 100 mL IVPB  Status:  Discontinued        2 g 200 mL/hr over 30 Minutes Intravenous Every 24 hours 07/18/21 1203 07/18/21 1628   07/18/21 1200  ceFEPIme (MAXIPIME) 2 g in sodium chloride 0.9 % 100 mL IVPB  Status:  Discontinued        2 g 200 mL/hr over 30 Minutes Intravenous Every 12 hours 07/18/21 1155 07/18/21 1156   07/18/21 0930  ceFEPIme (MAXIPIME) 2 g in sodium chloride 0.9 % 100 mL IVPB        2 g 200 mL/hr over 30 Minutes Intravenous  Once 07/18/21 0918 07/18/21 1135   07/18/21 0930  metroNIDAZOLE (FLAGYL) IVPB 500 mg  Status:  Discontinued        500 mg 100 mL/hr over 60 Minutes Intravenous  Once 07/18/21 [id number] 07/18/21 1534        Assessment/Plan 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 Arnoldo Morale at [place] on 8/12 - path w/ adenocarcinoma. Oncology has seen on 9/5 - Midline wound opened by Dr. Arnoldo Morale at bedside 8/24 and now has stool decompressing through CC fistula  - Currently has eakin's pouch. pouch changed yesterday - 9/15 - and pic in chart above from yesterday's change - Minimal output since 9/10 and having bm's. Will hold at full  liquid diet  since there is still some output and continue TPN - repeat CT today pending - WBC yesterday - 8.3   FEN - FLD. TPN. IVF per primary VTE - SCDs, anticoagulation currently on hold  ID - None currently.    COVID + A. Fib on Xarelto at home  ABL anemia - FOBT positive 8/24 Hypothyroidism Severe protein calorie malnutrition - albumin 1.3. Pre-alb <5. On TNA   LOS: 23 days    [facility] 08/10/2021, 10:00 AM Please see Amion for pager number during day hours 7:00am-4:30pm

## 2021-08-10 NOTE — Progress Notes (Signed)
Patient's TPN connection found to be infusing and leaking on the bed, disconnected from the PICC. TPN stopped. R PICC dressing changed, clean, dry and intact. RN made aware and spoke with pharmacy. Will follow up.

## 2021-08-10 NOTE — Plan of Care (Signed)
  Problem: Respiratory: Goal: Ability to maintain adequate ventilation will improve Outcome: Progressing   Problem: Education: Goal: Knowledge of General Education information will improve Description: Including pain rating scale, medication(s)/side effects and non-pharmacologic comfort measures Outcome: Progressing   Problem: Activity: Goal: Risk for activity intolerance will decrease Outcome: Progressing   Problem: Nutrition: Goal: Adequate nutrition will be maintained Outcome: Progressing   Problem: Coping: Goal: Level of anxiety will decrease Outcome: Progressing

## 2021-08-10 NOTE — Progress Notes (Signed)
PT Cancellation Note  Patient Details Name: Lauren [name] MRN: [id number] DOB: [birth year]   Cancelled Treatment:    Reason Eval/Treat Not Completed: Patient declined, no reason specified.  Related, tired, fatigue, sleepy and no way to be able to participate. 08/10/2021  Ginger Carne., PT Acute Rehabilitation Services [phone]  (pager) [phone]  (office)   Tessie Fass Mirranda Monrroy 08/10/2021, 1:30 PM

## 2021-08-10 NOTE — Progress Notes (Signed)
Nutrition Follow-up  DOCUMENTATION CODES:  Not applicable  INTERVENTION:  -Continue TPN management per Pharmacy -Continue Ensure Enlive po BID, each supplement provides 350 kcal and 20 grams of protein  NUTRITION DIAGNOSIS:  Inadequate oral intake related to altered GI function, acute illness as evidenced by NPO status. -- ongoing  GOAL:  Patient will meet greater than or equal to 90% of their needs -- addressing with TPN and supplements  MONITOR:  Labs, Weight trends, Skin, I & O's, Other (Comment) (TPN)  REASON FOR ASSESSMENT:  Consult Assessment of nutrition requirement/status, New TPN/TNA  ASSESSMENT:  75 year old female who presented to the ED on 8/24 s/p CPR. Pt with recent admission from 8/09 to 8/23 with anemia from GI bleed secondary to cecal mass s/p R hemicolectomy with primary anastomosis on 07/06/21. 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 Pt also found to be positive for COVID-19 on admission. PMH of atrial fibrillation, CHF, HTN, lymphedema, mitral regurgitation. Pt admitted with septic shock with abdominal wall fluid collection and concern for necrotizing fasciitis.  9/11 diet advanced to clear liquids 9/13 diet advanced to full liquids  [name] Eakin's pouch changed yesterday. Fistula with minimal output since 9/10, no BM x 2 days, repeat CT pending. Pt remains on full liquid diet with TPN. RN reports TPN was found to be leaking and had to be disconnected and discarded this morning. Per Pharmacy and Surgery, plan for D10 to hang at same rate as TPN (149m/hr) until new TPN bag can be started at 1800 today. Pharmacy notes TPN to continue at goal rate (1063mhr) with no plans for further diet advancement at this time. Pharmacy noted pt may have to be returned to NPO depending on how pt progresses per Surgery.  PO Intake: 5-50% x 3 recorded meals (23% average meal intake) Pt accepting Ensure Enlive/Plus BID  UOP: 5002m24 hours  Per RN edema  assessment, pt w/ mild pitting edema to BUE and non-pitting edema to BLE.   Labs: Na 130 (L) Medications: Protonix  Diet Order:   Diet Order             Diet full liquid Room service appropriate? Yes; Fluid consistency: Thin  Diet effective now                  EDUCATION NEEDS:  Not appropriate for education at this time  Skin:  Skin Assessment: Skin Integrity Issues: Skin Integrity Issues:: Other (Comment) Other: pressure injury to coccyx, pressure injury to R thigh, Eakins pouch to abdomen, MASD to bilateral buttocks  Last BM:  9/14  Height:  Ht Readings from Last 1 Encounters:  07/18/21 '5\' 2"'$  (1.575 m)   Weight:  Wt Readings from Last 1 Encounters:  08/10/21 (!) 142.4 kg   Ideal Body Weight:  50 kg  BMI:  Body mass index is 57.42 kg/m.  Estimated Nutritional Needs:  Kcal:  [id number]:  140-160 grams Fluid:  >/= 2.0 L    AmaLarkin InaS, RD, LDN (she/her/hers) RD pager number and weekend/on-call pager number located in [place]

## 2021-08-10 NOTE — Progress Notes (Signed)
Spoke with patient to see if after time passed she would be agreeable to drink contrast. Patient is not agreeable to begin drinking contrast at this time.   Contacted Richard Miu, [place] with General Surgery to notify her that patient is declining to drink oral contrast, and that per [place], it would be futile to complete CT abdomen without contrast present. Patient is agreeable to drink contrast tomorrow, as she states today she is too tired.  Richard Miu, PA is aware patient would not drink contrast and is okay with patient's plan to drink contrast in the morning and have scan completed 08/11/2021 after contrast is done.

## 2021-08-11 ENCOUNTER — Inpatient Hospital Stay (HOSPITAL_COMMUNITY): Payer: Medicare HMO

## 2021-08-11 DIAGNOSIS — A419 Sepsis, unspecified organism: Secondary | ICD-10-CM | POA: Diagnosis not present

## 2021-08-11 DIAGNOSIS — K632 Fistula of intestine: Secondary | ICD-10-CM | POA: Diagnosis not present

## 2021-08-11 DIAGNOSIS — U071 COVID-19: Secondary | ICD-10-CM | POA: Diagnosis not present

## 2021-08-11 DIAGNOSIS — N179 Acute kidney failure, unspecified: Secondary | ICD-10-CM | POA: Diagnosis not present

## 2021-08-11 DIAGNOSIS — L03311 Cellulitis of abdominal wall: Secondary | ICD-10-CM | POA: Diagnosis not present

## 2021-08-11 LAB — BASIC METABOLIC PANEL
Anion gap: 5 (ref 5–15)
BUN: 18 mg/dL (ref 8–23)
CO2: 27 mmol/L (ref 22–32)
Calcium: 7.7 mg/dL — ABNORMAL LOW (ref 8.9–10.3)
Chloride: 98 mmol/L (ref 98–111)
Creatinine, Ser: 0.49 mg/dL (ref 0.44–1.00)
GFR, Estimated: >60 mL/min (ref >60)
Glucose, Bld: 89 mg/dL (ref 70–99)
Potassium: 4.5 mmol/L (ref 3.5–5.1)
Sodium: 130 mmol/L — ABNORMAL LOW (ref 135–145)

## 2021-08-11 MED ORDER — ALBUTEROL SULFATE (2.5 MG/3ML) 0.083% IN NEBU
INHALATION_SOLUTION | RESPIRATORY_TRACT | Status: AC
  Administered 2021-08-11: 2.5 mg
  Filled 2021-08-11: qty 3

## 2021-08-11 MED ORDER — ZINC OXIDE 40 % EX OINT
TOPICAL_OINTMENT | CUTANEOUS | Status: DC | PRN
  Filled 2021-08-11 (×2): qty 57

## 2021-08-11 MED ORDER — LORAZEPAM 2 MG/ML IJ SOLN
1.0000 mg | Freq: Once | INTRAMUSCULAR | Status: AC | PRN
  Administered 2021-08-11: 1 mg via INTRAVENOUS
  Filled 2021-08-11: qty 1

## 2021-08-11 MED ORDER — TRAVASOL 10 % IV SOLN
INTRAVENOUS | Status: AC
  Filled 2021-08-11: qty 1512

## 2021-08-11 NOTE — Progress Notes (Signed)
Dropped off contrast yesterday 9/16. Pt was refusing to drink.   9/17 @ 0800 RN called and said pt is still refusing. Explained importance of PO due to diagnosis. RN aware of importance, but pt still refusing. Attempted to call RN for update at 12p with no answer.

## 2021-08-11 NOTE — Progress Notes (Addendum)
[facility] Progress Note     Subjective: Complains of fatigue and has declined therapies as well as CT scan yesterday due to this.  No acute issues.  Objective: Vital signs in last 24 hours: Temp:  [97.6 F (36.4 C)-97.9 F (36.6 C)] 97.9 F (36.6 C) (09/17 0700) Pulse Rate:  [91-101] 94 (09/17 0700) Resp:  [17-20] 20 (09/17 0700) BP: (106-137)/(54-76) 127/57 (09/17 0700) SpO2:  [96 %-97 %] 96 % (09/17 0700) Weight:  [142.7 kg] 142.7 kg (09/17 0407) Last BM Date: 08/08/21  Intake/Output from previous day: 09/16 0701 - 09/17 0700 In: 2315.7 [P.O.:360; I.V.:1955.7] Out: 1515 [Urine:1500; Drains:15] Intake/Output this shift: No intake/output data recorded.  PE: Gen:  Alert, NAD, pleasant Pulm:  rate and effort normal Abd: obese, soft, ND, NT, +BS, eakins pouch w/ small amount of tan output  EC fistula 9/15:    Lab Results:  Recent Labs    08/09/21 1545  WBC 8.3  HGB 7.8*  HCT 24.8*  PLT 223    BMET Recent Labs    08/10/21 0355 08/11/21 0435  NA 130* 130*  K 4.6 4.5  CL 100 98  CO2 26 27  GLUCOSE 79 89  BUN 23 18  CREATININE 0.49 0.49  CALCIUM 7.6* 7.7*    PT/INR No results for input(s): LABPROT, INR in the last 72 hours.  CMP     Component Value Date/Time   NA 130 (L) 08/11/2021 0435   K 4.5 08/11/2021 0435   CL 98 08/11/2021 0435   CO2 27 08/11/2021 0435   GLUCOSE 89 08/11/2021 0435   BUN 18 08/11/2021 0435   CREATININE 0.49 08/11/2021 0435   CALCIUM 7.7 (L) 08/11/2021 0435   PROT 6.6 08/09/2021 1820   ALBUMIN 1.5 (L) 08/09/2021 1820   AST 40 08/09/2021 1820   ALT 28 08/09/2021 1820   ALKPHOS 195 (H) 08/09/2021 1820   BILITOT 0.8 08/09/2021 1820   GFRNONAA >60 08/11/2021 0435   GFRAA >90 03/28/2014 0435   Lipase     Component Value Date/Time   LIPASE 25 03/27/2014 0600       Studies/Results: No results found.  Anti-infectives: Anti-infectives (From admission, onward)    Start     Dose/Rate Route Frequency  Ordered Stop   07/20/21 1000  remdesivir 100 mg in sodium chloride 0.9 % 100 mL IVPB       See Hyperspace for full Linked Orders Report.   100 mg 200 mL/hr over 30 Minutes Intravenous Daily 07/19/21 1040 07/21/21 1148   07/20/21 0200  vancomycin (VANCOREADY) IVPB 750 mg/150 mL  Status:  Discontinued        750 mg 150 mL/hr over 60 Minutes Intravenous Every 24 hours 07/19/21 1036 07/19/21 1449   07/19/21 2300  anidulafungin (ERAXIS) 100 mg in sodium chloride 0.9 % 100 mL IVPB  Status:  Discontinued        100 mg 78 mL/hr over 100 Minutes Intravenous Every 24 hours 07/18/21 2107 07/19/21 1449   07/19/21 1600  vancomycin (VANCOREADY) IVPB 750 mg/150 mL  Status:  Discontinued        750 mg 150 mL/hr over 60 Minutes Intravenous Every 24 hours 07/19/21 0919 07/19/21 1016   07/19/21 1600  vancomycin (VANCOREADY) IVPB 750 mg/150 mL  Status:  Discontinued        750 mg 150 mL/hr over 60 Minutes Intravenous Every 24 hours 07/19/21 1016 07/19/21 1036   07/19/21 1400  piperacillin-tazobactam (ZOSYN) IVPB 3.375 g  Status:  Discontinued  3.375 g 12.5 mL/hr over 240 Minutes Intravenous Every 8 hours 07/19/21 1016 07/31/21 1501   07/19/21 1130  remdesivir 200 mg in sodium chloride 0.9% 250 mL IVPB       See Hyperspace for full Linked Orders Report.   200 mg 580 mL/hr over 30 Minutes Intravenous Once 07/19/21 1040 07/19/21 1241   07/19/21 0100  metroNIDAZOLE (FLAGYL) IVPB 500 mg  Status:  Discontinued        500 mg 100 mL/hr over 60 Minutes Intravenous Every 12 hours 07/18/21 1203 07/18/21 1534   07/18/21 2200  ceFEPIme (MAXIPIME) 2 g in sodium chloride 0.9 % 100 mL IVPB  Status:  Discontinued        2 g 200 mL/hr over 30 Minutes Intravenous Every 12 hours 07/18/21 1156 07/18/21 1203   07/18/21 2200  meropenem (MERREM) 2 g in sodium chloride 0.9 % 100 mL IVPB  Status:  Discontinued        2 g 200 mL/hr over 30 Minutes Intravenous Every 12 hours 07/18/21 2104 07/19/21 1011   07/18/21 2200   anidulafungin (ERAXIS) 200 mg in sodium chloride 0.9 % 200 mL IVPB        200 mg 78 mL/hr over 200 Minutes Intravenous  Once 07/18/21 2108 07/19/21 0419   07/18/21 1800  clindamycin (CLEOCIN) IVPB 900 mg  Status:  Discontinued        900 mg 100 mL/hr over 30 Minutes Intravenous Every 8 hours 07/18/21 1534 07/18/21 2103   07/18/21 1600  vancomycin (VANCOREADY) IVPB 2000 mg/400 mL  Status:  Discontinued        2,000 mg 200 mL/hr over 120 Minutes Intravenous Every 24 hours 07/18/21 1544 07/19/21 0919   07/18/21 1545  piperacillin-tazobactam (ZOSYN) IVPB 3.375 g  Status:  Discontinued        3.375 g 12.5 mL/hr over 240 Minutes Intravenous Every 8 hours 07/18/21 1544 07/18/21 2104   07/18/21 1300  cefTRIAXone (ROCEPHIN) 2 g in sodium chloride 0.9 % 100 mL IVPB  Status:  Discontinued        2 g 200 mL/hr over 30 Minutes Intravenous Every 24 hours 07/18/21 1203 07/18/21 1628   07/18/21 1200  ceFEPIme (MAXIPIME) 2 g in sodium chloride 0.9 % 100 mL IVPB  Status:  Discontinued        2 g 200 mL/hr over 30 Minutes Intravenous Every 12 hours 07/18/21 1155 07/18/21 1156   07/18/21 0930  ceFEPIme (MAXIPIME) 2 g in sodium chloride 0.9 % 100 mL IVPB        2 g 200 mL/hr over 30 Minutes Intravenous  Once 07/18/21 0918 07/18/21 1135   07/18/21 0930  metroNIDAZOLE (FLAGYL) IVPB 500 mg  Status:  Discontinued        500 mg 100 mL/hr over 60 Minutes Intravenous  Once 07/18/21 [id number] 07/18/21 1534        Assessment/Plan 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 Arnoldo Morale at [place] on 8/12 - path w/ adenocarcinoma. Oncology has seen on 9/5 - Midline wound opened by Dr. Arnoldo Morale at bedside 8/24 and now has stool decompressing through CC fistula  - Currently has eakin's pouch. pouch changed 9/15 - and pic in chart above  - Minimal output since 9/10 and having bm's. Will hold at full liquid diet since there is still some output and continue TPN - repeat CT today pending    FEN - FLD. TPN. IVF per primary VTE - SCDs,  anticoagulation currently on hold  ID - None currently.    COVID + A. Fib on Xarelto at home  ABL anemia - FOBT positive 8/24 Hypothyroidism Severe protein calorie malnutrition - albumin 1.3. Pre-alb <5. On TNA   Addendum: CT completed and reviewed.  Overall improved.  No sign of persistent communication between bowel lumen and wound.  Continue current measures, local wound care and supportive care   LOS: 24 days    Lauren [name], [facility] 08/11/2021, 8:45 AM Please see Amion for pager number during day hours 7:00am-4:30pm

## 2021-08-11 NOTE — Progress Notes (Signed)
PHARMACY - TOTAL PARENTERAL NUTRITION CONSULT NOTE  Indication: Fistula  Patient Measurements: Height: _0  (157.5 cm) Weight: (!) 142.7 kg (314 lb 9.5 oz) IBW/kg (Calculated) : 50.1 TPN AdjBW (KG): 66.6 Body mass index is 57.54 kg/m.  Assessment:  27 YOF with recent R-hemicolectomy on 8/12 in the setting of a cecal mass - consistent with R-colon metastatic adenocarcinoma. 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 Pharmacy consulted to manage TPN for nutritional support in the setting of a colocutaneous fistula.    Glucose / Insulin: CBGs controlled on low end, SSI d/c'd 8/28 Electrolytes: Na 130 stable, K 4.5 stable (goal >/=4 for Afib), Mag 2 on 9/16 (goal >/= 2 for Afib), others WNL Renal:  SCr < 1, BUN WNL Hepatic: LFTs / Tbili / TG WNL, alk phos 195, albumin 1.4 Intake / Output; MIVF: Fistula output minimal - 15 cc documented in last 24hrs, UOP 1500 cc/24h. LBM 9/14  GI Imaging: 8/24 Abd CT: colocutaneous fistula, 7.4cm abd wall abscess, soft tissue edema, cellulitis vs nec fasciitis 8/25 Abd xray: diffuse abd wall emphysema, nonobstructive bowel gas pattern 9/4 CT abd/pelvis: medial upper left thigh hematoma Surgery planning CT to eval for contrast leakage - postponed to 9/17 - will f/u on results  GI Surgeries / Procedures: 8/12 at [facility]: R-hemicolectomy 8/24: Midline wound opened by Dr. Arnoldo Morale at bedside  Central access: PICC replaced 07/27/21 TPN start date: 07/19/21  Nutritional Goals (RD assessment on 9/16): 2300-2500 kcal, 140-160g AA, fluid >/= 2L per day  Current Nutrition:  TPN Advanced to FLD 9/13 - still some succus from fistula, hold on further advancement for now and possibly back to NPO depending on how patient progresses per Surgery.  Ensure BID - 1 dose charted as given yesterday  Plan:  Continue TPN at goal rate of 105 mL/hr at 1800 tonight, providing 151g AA and 2373 kCal meeting 100% of  estimated needs Electrolytes in TPN (no changes today) - cont Na to 150 mEq/L (MAX), K 45 mEq/L, Ca 4mq/L, Mg 178m/L, Phos 2271m/L, Cl:Ac 1:1 Add standard MVI and trace elements to TPN Monitor standard TPN labs on Monday/Thursday and PRN F/u Surgery plans for diet advancement and ability to wean TPN Will follow-up on CT with contrast planned 9/17  Thank you for allowing pharmacy to be a part of this patient's care.  EliAlycia RossettiharmD, BCPS Clinical Pharmacist Clinical phone for 08/11/2021: [id number] 8:01 AM   **Pharmacist phone directory can now be found on [email or web address] ([email or web address]).  Listed under [facility]

## 2021-08-11 NOTE — Progress Notes (Signed)
   Palliative Medicine Inpatient Follow Up Note   Chart Reviewed. Patient assessed.   No acute distress noted. Patient is awake, alert and oriented. Denies pain. States she feels better today, however is frustrated about having to drink contrast for CT. Encouraged patient to drink in order to allow for scan to assist with continuity of the best possible care. She verbalized understanding.   I created space and opportunity to clarify that patient's goals remain the same as she has been refusing lab work and testing etc. She is clear goals remain to continue to treat aggressively.  Erynne states her goal is to discharge to SNF rehab and eventually hopeful to return home. She understands she is not safe to return home alone at this time. She states if her nutrition can improve and her other medical problems can be stabilize she feels she will be much better. She shares she is not ready to give up and although tired and does not wish to have certain medical interventions done it is usually because of the timing.   Confirms DNR/DNI.   Discussed the importance of continued conversation with family and their  medical providers regarding overall plan of care and treatment options, ensuring decisions are within the context of the patients values and GOCs.  Questions addressed and support provided.   Objective Assessment: Vital Signs Vitals:   08/11/21 0700 08/11/21 1204  BP: (!) 127/57 133/61  Pulse: 94 100  Resp: 20 20  Temp: 97.9 F (36.6 C) 98 F (36.7 C)  SpO2: 96% 96%    Intake/Output Summary (Last 24 hours) at 08/11/2021 1507 Last data filed at 08/11/2021 0539 Gross per 24 hour  Intake 2185.66 ml  Output 1515 ml  Net 670.66 ml   Last Weight  Most recent update: 08/11/2021  4:10 AM    Weight  142.7 kg (314 lb 9.5 oz)              Gen:  NAD, chronically-ill appearing  HEENT: moist mucous membranes CV: Regular rate and rhythm, no murmurs rubs or gallops PULM: clear to  auscultation bilaterally. No wheezes/rales/rhonchi ABD: soft/nontender/nondistended/normal bowel sounds, ostomy  EXT: No edema Neuro: Alert and oriented x3, mood appropriate   SUMMARY OF RECOMMENDATIONS  -Continue current plan of care. Patient confirms wishes remain to treat the treatable. She is remaining hopeful for some stability in her health with a goal for rehab and to eventually return to her home.  -PMT will continue to support and follow on an as needed basis. Please secure chat or call team line for urgent needs.    Time Total: 40 min.   Visit consisted of counseling and education dealing with the complex and emotionally intense issues of symptom management and palliative care in the setting of serious and potentially life-threatening illness.Greater than 50%  of this time was spent counseling and coordinating care related to the above assessment and plan.  Alda Lea, AGPCNP-BC  Palliative Medicine Team [phone]  Palliative Medicine Team providers are available by phone from 7am to 7pm daily and can be reached through the team cell phone.  Should this patient require assistance outside of these hours, please call the patient's attending physician.

## 2021-08-11 NOTE — Progress Notes (Signed)
Lauren NOTE    RAIDYN [name]  [id number] DOB: 02/03/1946 DOA: 07/18/2021 PCP: [name], No    Brief Narrative:   Lauren [name] is a 75 year old female with past medical history of paroxysmal atrial fibrillation, chronic combined systolic and diastolic heart failure, hypertension, lymphedema, obesity presented to the hospital from a skilled nursing facility with hypotension, she also had nausea abdominal pain back pain.  CT scan of the abdomen pelvis showed the fistula extending from the bowel into the umbilical hernia with evidence of fluid collection and gas collection concerning for necrotizing fasciitis.  Patient was initially admitted to  [facility] for sepsis secondary to colocutaneous fistula with surrounding cellulitis with possible necrotizing fasciitis of abdominal wall.  Patient was started on empiric antibiotics with  meropenem, vancomycin, anidulafungin, and norepinephrine for vasopressor support.  General surgery was consulted.  Patient was then transferred from [facility] to [facility], ICU.  Patient was started on TPN 8/25.  Transferred from PCCM service to [facility] on 8/27.  Assessment & Plan:   Principal Problem:   Septic shock ([place]) Active Problems:   Hypokalemia   Morbid obesity with BMI of 50.0-59.9, adult (HCC)   Colon carcinoma ([place])   Enterocutaneous fistula   Cellulitis   AKI (acute kidney injury) ([place])   Hyponatremia  Colocutaneous fistula with surrounding cellulitis/abscess- repeat abdominal CT today, per GS. Denies abdominal pain, N/V.  - Full liquid diet - continue TPN, consider discontinuing if tolerating diet well  Acute blood loss anemia secondary to Left thigh hematoma- last hgb 9/15 stable at 7.8. repeat am - CBC am  Atrial fibrillation with RVR- xarelto being held, high risk of/active bleed. Rate controlled with amiodarone. TSH was elevated to 10.3 on admission.  - f/u with cardiology OP - continue amiodarone '200mg'$  daily  maintenance  HFrEF- stable - Continue strict intake and output charting - daily weights.  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  Seen by medical oncology, Dr. Chryl Heck on 07/30/2021.  No distant metastasis.  She will follow-up with medical oncology at [facility].  Acute kidney injury: Resolved Cr 0.49  Hypothyroidism: TSH elevated to 10.3 on admission - Continue Synthroid 19mg - recheck TSH and adjust medications as needed as outpatient when not acutely ill  Acute delirium: Resolved  Anxiety- stable - PRN ativan  GERD:  Continue PPI  COVID-19 viral infection, incidental. Completed course of remdesivir.   Hyponatremia- chronic, stable at 130. Likely to improve once tolerating PO more. Followed by pharmacy for TPN management. BMP am  Hypokalemia- resolved. K+ 4.5 BMP am  Hypomagnesmia-  resolved  Weakness/debility/deconditioning: Physical therapy has recommended SNF placement.  Morbid obesity Body mass index is 57.54 kg/m.  Would benefit from ongoing weight loss as outpatient  Goals of care Patient was seen by palliative care.   DNR/DNI.  DVT prophylaxis: Place and maintain sequential compression device Start: 07/18/21 1202 Place TED hose Start: 07/18/21 1202    Code Status: DNR  Family Communication:  None.  Disposition Plan:   Status is: Inpatient  Remains inpatient appropriate because:Unsafe d/c plan, IV treatments appropriate due to intensity of illness or inability to take PO, and Inpatient level of care appropriate due to severity of illness  Dispo: The patient is from: Home              Anticipated d/c is to: SNF              Patient currently is  not medically stable to d/c.   Difficult to place patient No  Consultants:  PCCM - signed off 8/27 General surgery Palliative care Cardiology  -  signed off 9/9  Procedures:  PICC line placement.  Antimicrobials:  None at  present  Subjective: Patient complains of nursing care today. Will not drink any more contrast as it is upsetting her stomach. She would like to eat regular food.  Objective: Vitals:   08/10/21 1600 08/10/21 1959 08/10/21 2342 08/11/21 0407  BP: (!) 112/54 106/74  137/76  Pulse: 91 98 100 93  Resp: 17     Temp: 97.8 F (36.6 C) 97.8 F (36.6 C) 97.9 F (36.6 C) 97.9 F (36.6 C)  TempSrc: Oral Oral Oral Oral  SpO2: 97%     Weight:    (!) 142.7 kg  Height:        Intake/Output Summary (Last 24 hours) at 08/11/2021 0726 Last data filed at 08/11/2021 0539 Gross per 24 hour  Intake 2315.66 ml  Output 1515 ml  Net 800.66 ml    Filed Weights   08/09/21 0500 08/10/21 0500 08/11/21 0407  Weight: (!) 141.2 kg (!) 142.4 kg (!) 142.7 kg    Physical examination: General:  Morbidly obese, chronic ill HENT:   No scleral pallor or icterus noted. Oral mucosa is moist.  Chest:  Clear breath sounds.  No crackles or wheezes.  CVS: S1 &S2 heard. No murmur.  Regular rate and rhythm. Abdomen: Soft, nontender, nondistended.  Bowel sounds are heard.  Ostomy noted, obese Extremities: No cyanosis, clubbing or gross lymphedema of the lower extremities.  Peripheral pulses are palpable. Psych: Alert, awake and oriented, anxious mood, tearful CNS:  Moving all extremities. Skin: Warm and dry.  Data Reviewed: I have personally reviewed the following labs and imaging studies,   CBC: Recent Labs  Lab 04-Sep-2021 0438 Sep 04, 2021 1940 08/06/21 0419 08/07/21 0600 08/08/21 0500 08/09/21 1545  WBC 9.0 9.4 8.9 10.4 12.7* 8.3  NEUTROABS 5.3 5.7 5.5 7.7 8.7*  --   HGB 7.7* 8.4* 7.6* 8.0* 7.6* 7.8*  HCT 26.0* 28.8* 25.7* 27.2* 25.2* 24.8*  MCV 90.6 91.7 92.1 93.5 91.3 102.5*  PLT 202 244 207 236 226 [id number]    Basic Metabolic Panel: Recent Labs  Lab 08/06/21 0419 08/07/21 0600 08/08/21 0500 08/09/21 1820 08/10/21 0355 08/11/21 0435  NA 138 133* 130* 130* 130* 130*  K 4.2 4.5 4.6 5.0 4.6 4.5  CL  107 102 98 100 100 98  CO2 '26 27 26 26 26 27  '$ GLUCOSE 91 94 100* 85 79 89  BUN '23 22 20 21 23 18  '$ CREATININE 0.49 0.49 0.52 0.50 0.49 0.49  CALCIUM 7.9* 7.7* 7.7* 7.8* 7.6* 7.7*  MG 2.1 1.9 2.0 2.0 2.0  --   PHOS 3.3 3.2 3.0 3.5 3.5  --     GFR: Estimated Creatinine Clearance: 87.4 mL/min (by C-G formula based on SCr of 0.49 mg/dL). Liver Function Tests: Recent Labs  Lab 09-04-2021 0438 08/06/21 0419 08/07/21 0600 08/08/21 0500 08/09/21 1820  AST 35 40 45* 36 40  ALT 23 27 32 28 28  ALKPHOS 172* 186* 167* 180* 195*  BILITOT 0.9 0.7 1.0 1.0 0.8  PROT 5.6* 5.5* 5.9* 5.7* 6.6  ALBUMIN 1.3* 1.3* 1.4* 1.4* 1.5*    Coagulation Profile: Recent Labs  Lab 08/08/21 0500  INR 1.2    Radiology Studies: No results found.  Scheduled Meds:  amiodarone  200 mg Oral Daily   Chlorhexidine Gluconate Cloth  6 each Topical Daily   feeding supplement  237 mL Oral BID BM   levothyroxine  100 mcg Oral QAC breakfast   mouth rinse  15 mL Mouth Rinse BID   pantoprazole  40 mg Oral Q0600   sodium chloride flush  10-40 mL Intracatheter Q12H   Continuous Infusions:  methocarbamol (ROBAXIN) IV 500 mg (07/25/21 2343)   TPN ADULT (ION) 105 mL/hr at 08/10/21 1722     LOS: 24 days    Richarda Osmond, MD [facility] Available via Epic secure chat 7am-7pm After these hours, please refer to coverage provider listed on [email or web address] 08/11/2021, 7:26 AM

## 2021-08-11 NOTE — Plan of Care (Signed)
  Problem: Fluid Volume: Goal: Hemodynamic stability will improve Outcome: Progressing   Problem: Clinical Measurements: Goal: Diagnostic test results will improve Outcome: Progressing Goal: Signs and symptoms of infection will decrease Outcome: Progressing   Problem: Respiratory: Goal: Ability to maintain adequate ventilation will improve Outcome: Progressing   Problem: Education: Goal: Knowledge of General Education information will improve Description: Including pain rating scale, medication(s)/side effects and non-pharmacologic comfort measures Outcome: Progressing   Problem: Health Behavior/Discharge Planning: Goal: Ability to manage health-related needs will improve Outcome: Progressing   Problem: Clinical Measurements: Goal: Ability to maintain clinical measurements within normal limits will improve Outcome: Progressing Goal: Will remain free from infection Outcome: Progressing Goal: Diagnostic test results will improve Outcome: Progressing Goal: Respiratory complications will improve Outcome: Progressing Goal: Cardiovascular complication will be avoided Outcome: Progressing   Problem: Activity: Goal: Risk for activity intolerance will decrease Outcome: Progressing   Problem: Nutrition: Goal: Adequate nutrition will be maintained Outcome: Progressing   Problem: Coping: Goal: Level of anxiety will decrease Outcome: Progressing   Problem: Elimination: Goal: Will not experience complications related to bowel motility Outcome: Progressing Goal: Will not experience complications related to urinary retention Outcome: Progressing   Problem: Pain Managment: Goal: General experience of comfort will improve Outcome: Progressing   Problem: Safety: Goal: Ability to remain free from injury will improve Outcome: Progressing   Problem: Skin Integrity: Goal: Risk for impaired skin integrity will decrease Outcome: Progressing   Problem: Education: Goal:  Knowledge about tracheostomy care/management will improve Outcome: Progressing   Problem: Activity: Goal: Ability to tolerate increased activity will improve Outcome: Progressing   Problem: Health Behavior/Discharge Planning: Goal: Ability to manage tracheostomy will improve Outcome: Progressing   Problem: Respiratory: Goal: Patent airway maintenance will improve Outcome: Progressing   Problem: Role Relationship: Goal: Ability to communicate will improve Outcome: Progressing

## 2021-08-12 ENCOUNTER — Inpatient Hospital Stay (HOSPITAL_COMMUNITY): Payer: Medicare HMO

## 2021-08-12 DIAGNOSIS — R109 Unspecified abdominal pain: Secondary | ICD-10-CM | POA: Insufficient documentation

## 2021-08-12 DIAGNOSIS — A419 Sepsis, unspecified organism: Secondary | ICD-10-CM | POA: Diagnosis not present

## 2021-08-12 DIAGNOSIS — N179 Acute kidney failure, unspecified: Secondary | ICD-10-CM | POA: Diagnosis not present

## 2021-08-12 DIAGNOSIS — R1084 Generalized abdominal pain: Secondary | ICD-10-CM

## 2021-08-12 DIAGNOSIS — K632 Fistula of intestine: Secondary | ICD-10-CM | POA: Diagnosis not present

## 2021-08-12 LAB — BASIC METABOLIC PANEL
Anion gap: 6 (ref 5–15)
BUN: 17 mg/dL (ref 8–23)
CO2: 28 mmol/L (ref 22–32)
Calcium: 7.6 mg/dL — ABNORMAL LOW (ref 8.9–10.3)
Chloride: 99 mmol/L (ref 98–111)
Creatinine, Ser: 0.51 mg/dL (ref 0.44–1.00)
GFR, Estimated: >60 mL/min (ref >60)
Glucose, Bld: 105 mg/dL — ABNORMAL HIGH (ref 70–99)
Potassium: 4.1 mmol/L (ref 3.5–5.1)
Sodium: 133 mmol/L — ABNORMAL LOW (ref 135–145)

## 2021-08-12 LAB — CBC
HCT: 25.3 % — ABNORMAL LOW (ref 36.0–46.0)
Hemoglobin: 7.4 g/dL — ABNORMAL LOW (ref 12.0–15.0)
MCH: 27.1 pg (ref 26.0–34.0)
MCHC: 29.2 g/dL — ABNORMAL LOW (ref 30.0–36.0)
MCV: 92.7 fL (ref 80.0–100.0)
Platelets: 259 10*3/uL (ref 150–400)
RBC: 2.73 MIL/uL — ABNORMAL LOW (ref 3.87–5.11)
RDW: 22.2 % — ABNORMAL HIGH (ref 11.5–15.5)
WBC: 8.1 10*3/uL (ref 4.0–10.5)
nRBC: 0 % (ref 0.0–0.2)

## 2021-08-12 LAB — GLUCOSE, CAPILLARY: Glucose-Capillary: 105 mg/dL — ABNORMAL HIGH (ref 70–99)

## 2021-08-12 MED ORDER — IPRATROPIUM-ALBUTEROL 0.5-2.5 (3) MG/3ML IN SOLN
3.0000 mL | RESPIRATORY_TRACT | Status: DC | PRN
  Administered 2021-08-18 – 2021-08-22 (×2): 3 mL via RESPIRATORY_TRACT
  Filled 2021-08-12 (×2): qty 3

## 2021-08-12 MED ORDER — CLINDAMYCIN PHOSPHATE 600 MG/50ML IV SOLN
600.0000 mg | Freq: Once | INTRAVENOUS | Status: AC
  Administered 2021-08-12: 600 mg via INTRAVENOUS
  Filled 2021-08-12: qty 50

## 2021-08-12 MED ORDER — LEVOTHYROXINE SODIUM 50 MCG PO TABS
50.0000 ug | ORAL_TABLET | Freq: Once | ORAL | Status: AC
  Administered 2021-08-12: 50 ug via ORAL
  Filled 2021-08-12: qty 1

## 2021-08-12 MED ORDER — TORSEMIDE 20 MG PO TABS
20.0000 mg | ORAL_TABLET | Freq: Every day | ORAL | Status: DC
  Administered 2021-08-12 – 2021-08-15 (×4): 20 mg via ORAL
  Filled 2021-08-12 (×4): qty 1

## 2021-08-12 MED ORDER — TRAVASOL 10 % IV SOLN
INTRAVENOUS | Status: AC
  Filled 2021-08-12: qty 1512

## 2021-08-12 MED ORDER — LEVOTHYROXINE SODIUM 75 MCG PO TABS
150.0000 ug | ORAL_TABLET | Freq: Every day | ORAL | Status: DC
  Administered 2021-08-13 – 2021-09-07 (×24): 150 ug via ORAL
  Filled 2021-08-12 (×26): qty 2

## 2021-08-12 MED ORDER — FUROSEMIDE 10 MG/ML IJ SOLN
40.0000 mg | Freq: Once | INTRAMUSCULAR | Status: AC
  Administered 2021-08-12: 40 mg via INTRAVENOUS
  Filled 2021-08-12: qty 4

## 2021-08-12 MED ORDER — ENOXAPARIN SODIUM 40 MG/0.4ML IJ SOSY
40.0000 mg | PREFILLED_SYRINGE | INTRAMUSCULAR | Status: DC
  Administered 2021-08-12 – 2021-08-19 (×8): 40 mg via SUBCUTANEOUS
  Filled 2021-08-12 (×8): qty 0.4

## 2021-08-12 MED ORDER — METHYLPREDNISOLONE SODIUM SUCC 125 MG IJ SOLR
80.0000 mg | Freq: Once | INTRAMUSCULAR | Status: AC
  Administered 2021-08-12: 80 mg via INTRAVENOUS
  Filled 2021-08-12: qty 2

## 2021-08-12 NOTE — Progress Notes (Addendum)
PROGRESS NOTE    Lauren [name]  [id number] DOB: June 01, 1946 DOA: 07/18/2021 PCP: [name], No    Brief Narrative:   Lauren [name] is a 75 year old female with past medical history of paroxysmal atrial fibrillation, chronic combined systolic and diastolic heart failure, hypertension, lymphedema, obesity presented to the hospital from a skilled nursing facility with hypotension, she also had nausea abdominal pain back pain.  CT scan of the abdomen pelvis showed the fistula extending from the bowel into the umbilical hernia with evidence of fluid collection and gas collection concerning for necrotizing fasciitis.  Patient was initially admitted to  [facility] for sepsis secondary to colocutaneous fistula with surrounding cellulitis with possible necrotizing fasciitis of abdominal wall.  Patient was started on empiric antibiotics with  meropenem, vancomycin, anidulafungin, and norepinephrine for vasopressor support.  General surgery was consulted.  Patient was then transferred from [facility] to [facility], ICU.  Patient was started on TPN 8/25.  Transferred from PCCM service to [facility] on 8/27.  Assessment & Plan:   Principal Problem:   Septic shock ([place]) Active Problems:   Hypokalemia   Morbid obesity with BMI of 50.0-59.9, adult (HCC)   Colon carcinoma ([place])   Enterocutaneous fistula   Cellulitis   AKI (acute kidney injury) ([place])   Hyponatremia  Colocutaneous fistula with surrounding cellulitis/abscess- CT abdomen was repeated yesterday and reviewed by GS. Showed improvement. Recommended continue wound care and supportive care. Non-surgical at this time. - Full liquid diet and advance as tolerated today - continue TPN, consider discontinuing if tolerating diet well  Acute blood loss anemia secondary to Left thigh hematoma and chronic disease- stable. 7.4 today.  - CBC every 2-3 days  Atrial fibrillation with RVR- xarelto being held, high risk of/active bleed. Rate  controlled with amiodarone. TSH was elevated to 10.3 on admission.  - f/u with cardiology OP - continue amiodarone '200mg'$  daily maintenance  Hematoma- patient has left thigh hematoma present on admission as well as a positive bloody stool during admission which led to holding anticoagulation. Patient has not had repeat bleeding and hgb has held stable since 9/5. Her HAS-BLED score still precludes her from anticoagulation, however, I believe she is a good candidate at this point to restart VTE ppx - lovenox ppx dose start today - CBC am  HFrEF- stable - Continue strict intake and output charting - daily weights. - restart home torsemide '20mg'$ . Patient received lasix x1 after event last night with respiratory distress.   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  Seen by medical oncology, Dr. Chryl Heck on 07/30/2021.  No distant metastasis.  She will follow-up with medical oncology at [facility].  Acute kidney injury: Resolved Cr 0.51  Hypothyroidism: TSH elevated to 10.3 on admission. Home med is 173mg levothroxine. - Increase synthroid to 1565m today - recheck TSH and adjust medications as needed as outpatient when not acutely ill  Acute delirium: Resolved  Anxiety- stable - PRN ativan  GERD:  Continue PPI  COVID-19 viral infection, incidental. Completed course of remdesivir.   Hyponatremia- chronic, improving at 1303. Likely to improve once tolerating PO more. Followed by pharmacy for TPN management. BMP am  Hypokalemia- resolved. K+ 4.5 BMP am  Hypomagnesmia-  resolved  Weakness/debility/deconditioning: Physical therapy has recommended SNF placement. - TOC consulted for SNF placement  Morbid obesity Body mass index is 57.54 kg/m.  Would benefit from ongoing weight loss as outpatient  Goals of care Patient was  seen by palliative care.   DNR/DNI.  DVT prophylaxis: Place and maintain sequential compression device  Start: 07/18/21 1202 Place TED hose Start: 07/18/21 1202    Code Status: DNR  Family Communication:  Attempted to contact brother  Disposition Plan:   Status is: Inpatient  Remains inpatient appropriate because:Unsafe d/c plan, IV treatments appropriate due to intensity of illness or inability to take PO, and Inpatient level of care appropriate due to severity of illness  Dispo: The patient is from: Home              Anticipated d/c is to: SNF              Patient currently is not medically stable to d/c.   Difficult to place patient No  Consultants:  PCCM - signed off 8/27 General surgery Palliative care Cardiology  -  signed off 9/9  Procedures:  PICC line placement.  Antimicrobials:  None at present  Subjective: Patient is much more comfortable today. She remains slightly confused. Sleeping when first arrived and easily aroused by voice.  Objective: Vitals:   08/12/21 0158 08/12/21 0200 08/12/21 0356 08/12/21 0735  BP:   (!) 113/42 129/69  Pulse: (!) 104  83 99  Resp: 19  19 (!) 21  Temp:  98 F (36.7 C) 97.6 F (36.4 C) 98.3 F (36.8 C)  TempSrc:  Rectal Axillary Oral  SpO2:   100%   Weight:      Height:        Intake/Output Summary (Last 24 hours) at 08/12/2021 0747 Last data filed at 08/12/2021 [id number] Gross per 24 hour  Intake 3043.56 ml  Output 2532 ml  Net 511.56 ml    Filed Weights   08/09/21 0500 08/10/21 0500 08/11/21 0407  Weight: (!) 141.2 kg (!) 142.4 kg (!) 142.7 kg    Physical examination: General:  Morbidly obese, chronic ill HENT:   No scleral pallor or icterus noted. Oral mucosa is moist.  Chest:  Clear breath sounds.  No crackles or wheezes.  CVS: S1 &S2 heard. No murmur.  Regular rate and rhythm. Abdomen: Soft, nontender, nondistended.  Bowel sounds are heard.  Ostomy noted, obese Extremities: No cyanosis, clubbing or gross lymphedema of the lower extremities.  Peripheral pulses are palpable. Psych: asleep, calm CNS:   asleep Skin: Warm and dry.  Data Reviewed: I have personally reviewed the following labs and imaging studies,   CBC: Recent Labs  Lab 08/11/21 1940 08/06/21 0419 08/07/21 0600 08/08/21 0500 08/09/21 1545 08/12/21 0350  WBC 9.4 8.9 10.4 12.7* 8.3 8.1  NEUTROABS 5.7 5.5 7.7 8.7*  --   --   HGB 8.4* 7.6* 8.0* 7.6* 7.8* 7.4*  HCT 28.8* 25.7* 27.2* 25.2* 24.8* 25.3*  MCV 91.7 92.1 93.5 91.3 102.5* 92.7  PLT 244 207 236 226 223 [id number]    Basic Metabolic Panel: Recent Labs  Lab 08/06/21 0419 08/07/21 0600 08/08/21 0500 08/09/21 1820 08/10/21 0355 08/11/21 0435 08/12/21 0350  NA 138 133* 130* 130* 130* 130* 133*  K 4.2 4.5 4.6 5.0 4.6 4.5 4.1  CL 107 102 98 100 100 98 99  CO2 '26 27 26 26 26 27 28  '$ GLUCOSE 91 94 100* 85 79 89 105*  BUN '23 22 20 21 23 18 17  '$ CREATININE 0.49 0.49 0.52 0.50 0.49 0.49 0.51  CALCIUM 7.9* 7.7* 7.7* 7.8* 7.6* 7.7* 7.6*  MG 2.1 1.9 2.0 2.0 2.0  --   --   PHOS 3.3 3.2 3.0 3.5  3.5  --   --     GFR: Estimated Creatinine Clearance: 87.4 mL/min (by C-G formula based on SCr of 0.51 mg/dL). Liver Function Tests: Recent Labs  Lab 08/06/21 0419 08/07/21 0600 08/08/21 0500 08/09/21 1820  AST 40 45* 36 40  ALT 27 32 28 28  ALKPHOS 186* 167* 180* 195*  BILITOT 0.7 1.0 1.0 0.8  PROT 5.5* 5.9* 5.7* 6.6  ALBUMIN 1.3* 1.4* 1.4* 1.5*    Coagulation Profile: Recent Labs  Lab 08/08/21 0500  INR 1.2    Radiology Studies: CT ABDOMEN PELVIS WO CONTRAST  Result Date: 08/11/2021 CLINICAL DATA:  Persistent post procedural fistula EXAM: CT ABDOMEN AND PELVIS WITHOUT CONTRAST TECHNIQUE: Multidetector CT imaging of the abdomen and pelvis was performed following the standard protocol without IV contrast. COMPARISON:  CT 07/18/2021, 9 4,022 FINDINGS: Lower chest: Persistent LEFT pleural effusion with passive atelectasis of the LEFT lower lobe. Small effusion on the RIGHT. Hepatobiliary: Postcholecystectomy.  No abscess. Pancreas: Pancreas is normal. No ductal  dilatation. No pancreatic inflammation. Spleen: Normal spleen Adrenals/urinary tract: Adrenal glands and kidneys are normal. The ureters and bladder normal. There is high-density within the ventral aspect of the bladder which is favored CT streak artifact as seen on lateral projection Stomach/Bowel: Stomach, duodenum small-bowel normal. No evidence of bowel obstruction. Oral contrast transversus to the rectum. No clear communication between the bowel in the cutaneous tissue. Continued reduction in subcutaneous gas along the RIGHT abdominal wall and groin region. No drainable abscess/fluid collections. Vascular/Lymphatic: Abdominal aorta is normal caliber. There is no retroperitoneal or periportal lymphadenopathy. No pelvic lymphadenopathy. Reproductive: Clear uterus Other: Hematoma in the medial;left thigh is decreased on the partially imaged. For example 1 lobe of the hematoma measures 41 mm decreased from 53. The adjacent lobe measures 51 mm compared to 67 mm. Musculoskeletal: No aggressive osseous lesion. IMPRESSION: 1. Continued decrease in volume extensive subcutaneous gas along the RIGHT abdominal wall and upper groin. 2. No evidence of persistent communication between the bowel and the subcutaneous tissue. 3. No evidence of bowel obstruction. Contrast traverses to the rectum. 4. Bilobed hematoma in the medial LEFT thigh is decreased volume. 5. Persistent moderate LEFT pleural effusion with LEFT lower lobe passive atelectasis. Electronically Signed   By: Suzy Bouchard M.D.   On: 08/11/2021 17:57   DG CHEST PORT 1 VIEW  Result Date: 08/12/2021 CLINICAL DATA:  Shortness of breath EXAM: PORTABLE CHEST 1 VIEW COMPARISON:  07/27/2021 FINDINGS: Right upper extremity PICC with tip overlying superior vena cava. Unchanged mild cardiomegaly and central congestion. Redemonstrated small left pleural effusion with associated atelectasis. No acute osseous abnormality. IMPRESSION: Cardiomegaly with vascular  congestion, small left pleural effusion, and associated atelectasis. Electronically Signed   By: Merilyn Baba M.D.   On: 08/12/2021 02:55    Scheduled Meds:  amiodarone  200 mg Oral Daily   Chlorhexidine Gluconate Cloth  6 each Topical Daily   feeding supplement  237 mL Oral BID BM   levothyroxine  100 mcg Oral QAC breakfast   mouth rinse  15 mL Mouth Rinse BID   pantoprazole  40 mg Oral Q0600   sodium chloride flush  10-40 mL Intracatheter Q12H   Continuous Infusions:  methocarbamol (ROBAXIN) IV 500 mg (07/25/21 2343)   TPN ADULT (ION) 105 mL/hr at 08/12/21 0400     LOS: 25 days    Richarda Osmond, MD [facility] Available via Epic secure chat 7am-7pm After these hours, please refer to coverage provider listed on [email or web address]  08/12/2021, 7:47 AM

## 2021-08-12 NOTE — Plan of Care (Signed)
  Problem: Respiratory: Goal: Ability to maintain adequate ventilation will improve Outcome: Progressing   Problem: Education: Goal: Knowledge of General Education information will improve Description: Including pain rating scale, medication(s)/side effects and non-pharmacologic comfort measures Outcome: Progressing   Problem: Activity: Goal: Risk for activity intolerance will decrease Outcome: Progressing   Problem: Nutrition: Goal: Adequate nutrition will be maintained Outcome: Progressing   Problem: Coping: Goal: Level of anxiety will decrease Outcome: Progressing   Problem: Elimination: Goal: Will not experience complications related to bowel motility Outcome: Progressing Goal: Will not experience complications related to urinary retention Outcome: Progressing

## 2021-08-12 NOTE — Significant Event (Signed)
Rapid Response Event Note   Reason for Call :  Lethargic, wheezy  Pt given albuterol tx PTA RRT.  Initial Focused Assessment:  Pt lying in bed with eyes closed. She is having mildly labored breathing. Pt will awaken to repeated verbal stimuli. Once awake she is able to be conversant with staff. She has an audible exp wheeze. Lung sounds diminished. Skin cool to touch.  T-98 (R), HR-98, BP-127 56, RR-26, SpO2-100% on RA.    Interventions:  CBG-105 Duoneb PCXR-Cardiomegaly with vascular congestion, small left pleural effusion, and associated atelectasis. Solumedrol '80mg'$  IV Plan of Care:  Give solumedrol and lasix. Continue to monitor closely. Call RRT if further assistance needed.    Event Summary:   MD Notified: Dr. Claria Dice notified PTA RRT Call [place] [phone] End Time:0300  Dillard Essex, RN

## 2021-08-12 NOTE — Progress Notes (Signed)
PHARMACY - TOTAL PARENTERAL NUTRITION CONSULT NOTE  Indication: Fistula  Patient Measurements: Height: _0  (157.5 cm) Weight: (!) 142.7 kg (314 lb 9.5 oz) IBW/kg (Calculated) : 50.1 TPN AdjBW (KG): 66.6 Body mass index is 57.54 kg/m.  Assessment:  67 YOF with recent R-hemicolectomy on 8/12 in the setting of a cecal mass - consistent with R-colon metastatic adenocarcinoma. 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 Pharmacy consulted to manage TPN for nutritional support in the setting of a colocutaneous fistula.    Glucose / Insulin: CBGs controlled on low end, SSI d/c'd 8/28 Electrolytes: Na 130>133 (on max), K 4.1 stable (goal >/=4 for Afib), Mag 2 on 9/16 (goal >/= 2 for Afib), others WNL Renal:  SCr < 1, BUN WNL Hepatic: LFTs / Tbili / TG WNL, alk phos 195, albumin 1.4 Intake / Output; MIVF: Fistula output minimal - 15>30 cc documented in last 24hrs, UOP 0.3 ml/kg/hr. LBM 9/17 x2  GI Imaging: 8/24 Abd CT: colocutaneous fistula, 7.4cm abd wall abscess, soft tissue edema, cellulitis vs nec fasciitis 8/25 Abd xray: diffuse abd wall emphysema, nonobstructive bowel gas pattern 9/4 CT abd/pelvis: medial upper left thigh hematoma 9/17: CT abd - No evidence of communication between bowel and subQ tissue  GI Surgeries / Procedures: 8/12 at [facility]: R-hemicolectomy 8/24: Midline wound opened by Dr. Arnoldo Morale at bedside  Central access: PICC replaced 07/27/21 TPN start date: 07/19/21  Nutritional Goals (RD assessment on 9/16): 2300-2500 kcal, 140-160g AA, fluid >/= 2L per day  Current Nutrition:  TPN Advanced to FLD 9/13 - not taking in much Ensure BID - only 2 taken in last 48h  Plan:  Continue TPN at goal rate of 105 mL/hr at 1800 tonight, providing 151g AA and 2373 kCal meeting 100% of estimated needs Electrolytes in TPN (No changes today) - cont Na to 150 mEq/L (MAX), K 45 mEq/L, Ca 59mq/L, Mg 189m/L, Phos 2271m/L, Cl:Ac  1:1 Add standard MVI and trace elements to TPN TPN labs in AM F/u plans for diet advancement and better intake for TPN weaning  JonBertis RuddyharmD Clinical Pharmacist ED Pharmacist Phone # [phone]/2022 7:22 AM

## 2021-08-12 NOTE — Progress Notes (Deleted)
Cardiology Office Note  Date: 08/12/2021   ID: Lauren [name], DOB Mar 16, 1946, MRN [id number]  PCP:  Pcp, No  Cardiologist:  Carlyle Dolly, MD Electrophysiologist:  None   Chief Complaint: Hospital follow-up  History of Present Illness: Lauren [name] is a 76 y.o. female with a history of atrial fibrillation RVR, HTN, chronic combined systolic and diastolic heart failure, colon cancer.   He was last seen by Dr. Harl Bowie for 05/09/2020.  History of chronic diastolic heart failure.  Weights were stable around 270 to 277.  She was taking torsemide 20 mg daily.  Last echocardiogram showed EF of 50 to 55% with mild MR.  Lymphedema was improving with clinical treatments.  Atrial fibrillation with fatigue on Lopressor.  Having some fatigue on current diltiazem as well.  No recent palpitations.  She was compliant with her antihypertensive medications.  Her torsemide dosage at 40 mg was too much and she had orthostatic symptoms.  20 mg daily.  Not to be quite enough she was continuing torsemide.  She had to stop lymphedema clinic due to her husband's death and limitation of last COVID-19.  She was planning to reestablish with lymphedema clinic when risk creatinine lowered.  Previously low blood pressures and fatigue and low pressure.  She wanted to stop diltiazem due to side effects.  He felt like heart rates were okay.  Plan was to hold diltiazem and plan for 24-hour monitor.  Her blood pressure was at goal and she was continuing current medications.  She presented to [facility] on 06/28/2021 secondary to profound anemia.  During that admission it was discovered she had a cecal mass that required further evaluation with colonoscopy.  She was given blood transfusion and hemoglobin increased to 8.2 the day of discharge.  She presented to [facility] emergency department on 07/03/2021 secondary to rectal pain and constipation stating she felt impacted.  She tried MiraLAX magnesium citrate which were  ineffective.  She denies chest pain or shortness of breath.  She did complain of increased swelling in both lower extremities with her lymphedema.  She had not taken her Cardizem on metoprolol prior days leading up to presentation due to feeling bad.   Per hospitalist note on 08/12/2021 she presented to hospital from a skilled nursing facility with hypotension nausea,, abdominal pain and back pain.  CT of the abdomen showed fistula extending from bowel into the umbilical hernia with evidence of fluid collection and gas collection concerning for necrotizing fasciitis.  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  She was started on empiric antibiotics.  General surgery was consulted.  She was then transferred to [facility] and started on TPN 07/19/2021.  She had acute blood loss anemia secondary to left thigh hematoma and chronic disease with hemoglobin stable at 7.4.  Atrial fibrillation with RVR.  Xarelto was being held due to high risk of active bleeding.  She was rate controlled on amiodarone.  TSH was elevated to 8.3 on admission.  She was to follow-up with cardiology outpatient .   To continue amiodarone 200 mg milligrams for daily maintenance.  She had a left thigh hematoma present on admission as well as a positive bloody stool which led to holding anticoagulation.  She was started on VTE prophylaxis with Lovenox on 08/13/2021.  She was continuing strict intake and output with daily weights.  She was restarted on her home  torsemide.  He received Lasix x1 after event the previous night with respiratory distress.  She she was status post right hemicolectomy for adenocarcinoma of cecum on 07/06/2021.  She was being seen by Dr. Chryl Heck on 07/30/2021.  She had no distant metastasis.  She was to follow-up with medical oncology in [place].  AKI had resolved with recent creatinine of 0.51.   TSH was elevated to 10.3 on admission.  Her Synthroid was increased to 250 mcg.  Plan was to recheck TSH and adjust medications as needed outpatient.   Past Medical History:  Diagnosis Date   Acute cholecystitis 12/06/2012   Chronic combined systolic and diastolic CHF (congestive heart failure) (HCC)    Colon cancer (HCC)    Diverticulosis    Hypotension    BP runs soft   Hypothyroidism    Lymphedema    Morbid obesity ([place])    Obesity    Pancreatitis, acute 0000000   Periumbilical hernia    Permanent atrial fibrillation [facility])     Past Surgical History:  Procedure Laterality Date   BIOPSY  07/05/2021   Procedure: BIOPSY;  Surgeon: [name], DO;  [facility];  Service: Endoscopy;;   CHOLECYSTECTOMY N/A 03/25/2014   Procedure: LAPAROSCOPIC CHOLECYSTECTOMY WITH INTRAOPERATIVE CHOLANGIOGRAM;  Surgeon: Ralene Ok, MD;  Location: [place];  Service: General;  Laterality: N/A;   COLONOSCOPY WITH PROPOFOL N/A 07/05/2021   Procedure: COLONOSCOPY WITH PROPOFOL;  Surgeon: [name], DO;  [facility];  Service: Endoscopy;  Laterality: N/A;   ESOPHAGOGASTRODUODENOSCOPY (EGD) WITH PROPOFOL N/A 07/05/2021   Procedure: ESOPHAGOGASTRODUODENOSCOPY (EGD) WITH PROPOFOL;  Surgeon: [name], DO;  [facility];  Service: Endoscopy;  Laterality: N/A;   PARTIAL COLECTOMY N/A 07/06/2021   Procedure: RIGHT HEMICOLECTOMY;  Surgeon: [name] Signs, MD;  Location: [facility];  Service: General;  Laterality: N/A;   POLYPECTOMY  07/05/2021   Procedure: POLYPECTOMY;  Surgeon: [name], DO;  [facility];  Service: Endoscopy;;    No current facility-administered medications for this visit.   No current outpatient medications on file.   Facility-Administered Medications Ordered in Other Visits  Medication Dose Route Frequency Provider Last Rate Last Admin   amiodarone (PACERONE) tablet 200 mg  200 mg Oral Daily Pokhrel, Laxman, MD   200  mg at 08/12/21 1056   Chlorhexidine Gluconate Cloth 2 % PADS 6 each  6 each Topical Daily Chesley Mires, MD   6 each at 08/12/21 1057   diphenhydrAMINE (BENADRYL) 12.5 MG/5ML elixir 12.5 mg  12.5 mg Oral Q6H PRN Pokhrel, Laxman, MD       enoxaparin (LOVENOX) injection 40 mg  40 mg Subcutaneous Q24H Doristine Mango L, MD   40 mg at 08/12/21 1056   feeding supplement (ENSURE ENLIVE / ENSURE PLUS) liquid 237 mL  237 mL Oral BID BM Sheikh, Omair Latif, DO   237 mL at 08/12/21 1414   haloperidol lactate (HALDOL) injection 0.5 mg  0.5 mg Intravenous Q6H PRN Bonnielee Haff, MD   0.5 mg at 08/08/21 1026   HYDROmorphone (DILAUDID) injection 0.5-1 mg  0.5-1 mg Intravenous Q4H PRN Bonnielee Haff, MD   1 mg at 08/12/21 1110   ipratropium-albuterol (DUONEB) 0.5-2.5 (3) MG/3ML nebulizer solution 3 mL  3 mL Nebulization Q2H PRN Quintella Baton, MD       [START ON 08/13/2021] levothyroxine (SYNTHROID) tablet 150 mcg  150 mcg Oral Q0600 Richarda Osmond, MD       liver oil-zinc  oxide (DESITIN) 40 % ointment   Topical PRN Richarda Osmond, MD       LORazepam (ATIVAN) tablet 1 mg  1 mg Oral Q6H PRN Pokhrel, Laxman, MD   1 mg at 08/12/21 1110   MEDLINE mouth rinse  15 mL Mouth Rinse BID Pokhrel, Laxman, MD   15 mL at 08/12/21 1059   methocarbamol (ROBAXIN) 500 mg in dextrose 5 % 50 mL IVPB  500 mg Intravenous Q8H PRN Bonnielee Haff, MD 100 mL/hr at 07/25/21 2343 500 mg at 07/25/21 2343   ondansetron (ZOFRAN) injection 4 mg  4 mg Intravenous Q6H PRN Estill Cotta, NP   4 mg at 08/10/21 2352   pantoprazole (PROTONIX) EC tablet 40 mg  40 mg Oral Q0600 Vena Rua, PA-C   40 mg at 08/12/21 0541   sodium chloride flush (NS) 0.9 % injection 10-40 mL  10-40 mL Intracatheter Q12H Mannam, Praveen, MD   10 mL at 08/12/21 1100   torsemide (DEMADEX) tablet 20 mg  20 mg Oral Daily Richarda Osmond, MD   20 mg at 08/12/21 1414   TPN ADULT (ION)   Intravenous Continuous TPN Bertis Ruddy, RPH 105 mL/hr at  08/12/21 1736 New Bag at 08/12/21 1736   Allergies:  Codeine, Compazine [prochlorperazine edisylate], Prednisone, Tetanus toxoids, and Vicodin [hydrocodone-acetaminophen]   Social History: The patient  reports that she has never smoked. She has never used smokeless tobacco. She reports that she does not drink alcohol and does not use drugs.   Family History: The patient's family history includes Cancer in her father; Diabetes in her maternal grandfather; Heart disease in her maternal grandmother; Hypertension in her brother; Kidney disease in her maternal grandfather; Kidney failure in her brother.   ROS:  Please see the history of present illness. Otherwise, complete review of systems is positive for {NONE DEFAULTED:18576}.  All other systems are reviewed and negative.   Physical Exam: VS:  LMP 09/13/2016 , BMI There is no height or weight on file to calculate BMI.  Wt Readings from Last 3 Encounters:  08/11/21 (!) 314 lb 9.5 oz (142.7 kg)  07/10/21 253 lb 4.9 oz (114.9 kg)  03/10/20 270 lb (122.5 kg)    General: Patient appears comfortable at rest. HEENT: Conjunctiva and lids normal, oropharynx clear with moist mucosa. Neck: Supple, no elevated JVP or carotid bruits, no thyromegaly. Lungs: Clear to auscultation, nonlabored breathing at rest. Cardiac: Regular rate and rhythm, no S3 or significant systolic murmur, no pericardial rub. Abdomen: Soft, nontender, no hepatomegaly, bowel sounds present, no guarding or rebound. Extremities: No pitting edema, distal pulses 2+. Skin: Warm and dry. Musculoskeletal: No kyphosis. Neuropsychiatric: Alert and oriented x3, affect grossly appropriate.  ECG:  {EKG/Telemetry Strips Reviewed:[id number]}  Recent Labwork: 07/28/2021: TSH 10.360 08/09/2021: ALT 28; AST 40 08/10/2021: Magnesium 2.0 08/12/2021: BUN 17; Creatinine, Ser 0.51; Hemoglobin 7.4; Platelets 259; Potassium 4.1; Sodium 133     Component Value Date/Time   CHOL 190 12/05/2012 0531    TRIG 92 08/06/2021 0417   HDL 70 12/05/2012 0531   CHOLHDL 2.7 12/05/2012 0531   VLDL 31 12/05/2012 0531   LDLCALC 89 12/05/2012 0531    Other Studies Reviewed Today:   Assessment and Plan:  1. Chronic diastolic heart failure ([place])   2. Atrial fibrillation, unspecified type ([place])   3. Essential hypertension   4. Lymphedema   5. Colon carcinoma ([place])      Medication Adjustments/Labs and Tests Ordered: Current medicines are reviewed at length  with the patient today.  Concerns regarding medicines are outlined above.   Disposition: Follow-up with ***  Signed, Levell July, NP 08/12/2021 7:50 PM    [facility] at [facility] [place], [place], [place] [address] Phone: [phone]; Fax: [phone]

## 2021-08-12 NOTE — Progress Notes (Deleted)
PHARMACY - TOTAL PARENTERAL NUTRITION CONSULT NOTE  Indication: Fistula  Patient Measurements: Height: _0  (157.5 cm) Weight: (!) 142.7 kg (314 lb 9.5 oz) IBW/kg (Calculated) : 50.1 TPN AdjBW (KG): 66.6 Body mass index is 57.54 kg/m.  Assessment:  14 YOF with recent R-hemicolectomy on 8/12 in the setting of a cecal mass - consistent with R-colon metastatic adenocarcinoma. 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 Pharmacy consulted to manage TPN for nutritional support in the setting of a colocutaneous fistula.    Glucose / Insulin: CBGs controlled on low end, SSI d/c'd 8/28 Electrolytes: Na 130>133(on max), K 4.1 stable (goal >/=4 for Afib), Mag 2 on 9/16 (goal >/= 2 for Afib), others wnl Renal:  SCr < 1, BUN WNL Hepatic: LFTs / Tbili / TG WNL, alk phos 195, albumin 1.4 Intake / Output; MIVF: Fistula output minimal - 15>30cc documented in last 24hrs, UOP 0.3 ml/kg/hr. LBM 9/17 x 2  GI Imaging: 8/24 Abd CT: colocutaneous fistula, 7.4cm abd wall abscess, soft tissue edema, cellulitis vs nec fasciitis 8/25 Abd xray: diffuse abd wall emphysema, nonobstructive bowel gas pattern 9/4 CT abd/pelvis: medial upper left thigh hematoma 9/17: CT abd - No evidence of communication between bowel and subQ tissue  GI Surgeries / Procedures: 8/12 at [facility]: R-hemicolectomy 8/24: Midline wound opened by Dr. Arnoldo Morale at bedside  Central access: PICC replaced 07/27/21 TPN start date: 07/19/21  Nutritional Goals (RD assessment on 9/16): 2300-2500 kcal, 140-160g AA, fluid >/= 2L per day  Current Nutrition:  TPN Advanced to FLD 9/13 - not taking in much Ensure BID - only 2 taken in last 48h  Plan:  Continue TPN at goal rate of 105 mL/hr at 1800 tonight, providing 151g AA and 2373 kCal meeting 100% of estimated needs Electrolytes in TPN (No changes today) - cont Na to 150 mEq/L (MAX), K 45 mEq/L, Ca 8mq/L, Mg 13m/L, Phos 2231m/L, Cl:Ac  1:1 Add standard MVI and trace elements to TPN TPN labs in AM F/u plans for diet advancement and better intake for TPN weaning  JonBertis RuddyharmD Clinical Pharmacist ED Pharmacist Phone # [phone]/2022 10:54 AM

## 2021-08-13 ENCOUNTER — Ambulatory Visit: Payer: Medicare HMO | Admitting: Family Medicine

## 2021-08-13 DIAGNOSIS — I4891 Unspecified atrial fibrillation: Secondary | ICD-10-CM

## 2021-08-13 DIAGNOSIS — C189 Malignant neoplasm of colon, unspecified: Secondary | ICD-10-CM

## 2021-08-13 DIAGNOSIS — I5032 Chronic diastolic (congestive) heart failure: Secondary | ICD-10-CM

## 2021-08-13 DIAGNOSIS — R6521 Severe sepsis with septic shock: Secondary | ICD-10-CM | POA: Diagnosis not present

## 2021-08-13 DIAGNOSIS — I10 Essential (primary) hypertension: Secondary | ICD-10-CM

## 2021-08-13 DIAGNOSIS — I890 Lymphedema, not elsewhere classified: Secondary | ICD-10-CM

## 2021-08-13 DIAGNOSIS — A419 Sepsis, unspecified organism: Secondary | ICD-10-CM | POA: Diagnosis not present

## 2021-08-13 LAB — COMPREHENSIVE METABOLIC PANEL
ALT: 19 U/L (ref 0–44)
AST: 23 U/L (ref 15–41)
Albumin: 1.5 g/dL — ABNORMAL LOW (ref 3.5–5.0)
Alkaline Phosphatase: 137 U/L — ABNORMAL HIGH (ref 38–126)
Anion gap: 10 (ref 5–15)
BUN: 23 mg/dL (ref 8–23)
CO2: 26 mmol/L (ref 22–32)
Calcium: 8.1 mg/dL — ABNORMAL LOW (ref 8.9–10.3)
Chloride: 97 mmol/L — ABNORMAL LOW (ref 98–111)
Creatinine, Ser: 0.52 mg/dL (ref 0.44–1.00)
GFR, Estimated: >60 mL/min (ref >60)
Glucose, Bld: 101 mg/dL — ABNORMAL HIGH (ref 70–99)
Potassium: 4.2 mmol/L (ref 3.5–5.1)
Sodium: 133 mmol/L — ABNORMAL LOW (ref 135–145)
Total Bilirubin: 0.4 mg/dL (ref 0.3–1.2)
Total Protein: 5.9 g/dL — ABNORMAL LOW (ref 6.5–8.1)

## 2021-08-13 LAB — PHOSPHORUS: Phosphorus: 3.4 mg/dL (ref 2.5–4.6)

## 2021-08-13 LAB — MAGNESIUM: Magnesium: 1.9 mg/dL (ref 1.7–2.4)

## 2021-08-13 LAB — TRIGLYCERIDES: Triglycerides: 62 mg/dL (ref <150)

## 2021-08-13 MED ORDER — TRAVASOL 10 % IV SOLN
INTRAVENOUS | Status: AC
  Filled 2021-08-13: qty 1512

## 2021-08-13 MED ORDER — METOPROLOL TARTRATE 12.5 MG HALF TABLET
12.5000 mg | ORAL_TABLET | Freq: Two times a day (BID) | ORAL | Status: DC
  Administered 2021-08-13 – 2021-09-07 (×42): 12.5 mg via ORAL
  Filled 2021-08-13 (×50): qty 1

## 2021-08-13 NOTE — Progress Notes (Signed)
PROGRESS NOTE    Lauren [name]  [id number] DOB: 1946/06/17 DOA: 07/18/2021 PCP: [name], No    Brief Narrative:  Lauren [name] is a 75 year old female with past medical history significant for proximal atrial fibrillation, chronic combined systolic and diastolic congestive heart failure, benign essential hypertension, lymphedema, obesity who presented to [facility] ED on [id number] from [place] after she was found to be hypotensive.  Patient was complaining of nausea, abdominal pain, right-sided back pain.  CT abdomen/pelvis showing fistula extending from bowel adjacent to suture through an umbilical hernia into the abdominal wall with evidence for connection to the overlying skin and fluid collection and gas tracking anterior abdominal wall concerning for necrotizing fasciitis.  Patient was initially admitted to the PCCM service at [facility] for sepsis secondary to colocutaneous fistula with surrounding cellulitis with possible necrotizing fasciitis of abdominal wall.;  Started on empiric antibiotics with meropenem, vancomycin, anidulafungin, and norepinephrine for vasopressor support.  General surgery was consulted.  Patient was transferred from [facility] to [facility], ICU.  Patient was started on TPN 8/25.  Transferred from PCCM service to [facility] on 8/27.   Assessment & Plan:   Principal Problem:   Septic shock ([place]) Active Problems:   Hypokalemia   Morbid obesity with BMI of 50.0-59.9, adult (HCC)   Colon carcinoma ([place])   Enterocutaneous fistula   Cellulitis   AKI (acute kidney injury) ([employer] [facility])   Hyponatremia   Abdominal pain   Septic shock, POA Colocutaneous fistula with surrounding cellulitis/abscess Patient initially presenting to [facility] with hypotension and found to have colocutaneous fistula with surrounding cellulitis/abscess and initial concern for necrotizing fasciitis.  Was seen by general surgery, Dr. Arnoldo Morale who performed a bedside  decompression of abscess.  Patient was initially placed on empiric antibiotics with meropenem, vancomycin and anidulafungin with vasopressor support with norepinephrine.  Transferred to [facility] intensive care unit. Norepinephrine titrated off; completed course of empiric antibiotics.  Repeat CT abdomen/pelvis 9/17 with overall improvement and no sign of persistent communication between bowel movement and wound. --General surgery following, appreciate assistance --Continue TPN, pharmacy consulted for management --Full liquid diet; further advancement per General Surgery --Continue monitor ostomy/eakin's pouch output --Supportive care  Acute blood loss anemia Left thigh hematoma CT scan abdomen/pelvis notable for left thigh hematoma.  Evaluated by orthopedics, Dr. Ninfa Linden who recommended continued observation with no recommendation of surgical intervention to evacuate any type of hematoma.  Transfused 4u pRBCs; last 07/29/2021. Hemoglobin stable; 7.4. --Continue to monitor CBC every 3 days  Atrial fibrillation with RVR Seen by cardiology, now signed off 08/03/2021.  Was on Xarelto outpatient.  Amiodarone drip now converted to amiodarone 200 mg p.o. daily.  Chronic combined systolic/diastolic congestive heart failure. TTE 06/29/2021 in care everywhere with LVEF 30%, mild/moderate MVR, LA severely dilated. Evaluated by cardiology, not a candidate for invasive or aggressive intervention. --Strict I's and O's and daily weights  Hx colon adenocarcinoma Patient underwent right hemicolectomy for adenocarcinoma of the cecum on 07/06/2021.  Seen by medical oncology, Dr. Chryl Heck on 07/30/2021.  From the adenocarcinoma standpoint, no evidence of distant metastasis with overall good prognosis. --Plan outpatient follow-up medical oncology in [place].  Acute renal failure: Resolved Etiology likely secondary to ATN from septic shock as above. --Cr 1.58>>0.52 --Continue monitor BMP closely  Hypothyroidism:   TSH elevated 10.3 on admission. --Levothyroxine increased to 140mg PO daily --Will need repeat TFTs 4-6 weeks outpatient  Acute delirium: Resolved  GERD: Continue PPI  COVID-19 viral  infection, incidental. Completed course of remdesivir.  Oxygenating well on room air.  Anxiety --Ativan as needed  Hyponatremia Hypokalemia Hypomagnesmia Repleted during hospitalization.  Currently on TPN, managed by pharmacy.  Weakness/debility/deconditioning: --PT/OT recommending SNF placement. --Social work for assistance --Continue therapy while inpatient  Morbid obesity Body mass index is 57.54 kg/m.  Discussed with patient needs for aggressive lifestyle changes/weight loss as this complicates all facets of care.  Outpatient follow-up with PCP.   Goals of care Patient was seen by palliative care.  CODE STATUS changed to DNR/DNI.    DVT prophylaxis: enoxaparin (LOVENOX) injection 40 mg Start: 08/12/21 1000 SCDs Start: 08/12/21 0802 Place and maintain sequential compression device Start: 07/18/21 1202 Place TED hose Start: 07/18/21 1202   Code Status: DNR Family Communication: No family present at bedside this morning  Disposition Plan:  Level of care: Progressive Status is: Inpatient  Remains inpatient appropriate because:Unsafe d/c plan, IV treatments appropriate due to intensity of illness or inability to take PO, and Inpatient level of care appropriate due to severity of illness  Dispo: The patient is from: Home              Anticipated d/c is to: SNF              Patient currently is not medically stable to d/c.   Difficult to place patient No    Consultants:  PCCM - signed off 8/27 General surgery Palliative care Cardiology  -  signed off 9/9  Procedures:  PICC  Antimicrobials:  Vancomycin 8/24 - 8/25 Cefepime 8/24 - 8/24 Metronidazole 8/24 - 8/24 Meropenem 8/25 - 8/26 Clindamycin 8 /24 - 8/24 Zosyn 8/25 - 9/6 Anidulafungin 8/24 -  8/25   Subjective: Patient seen examined at bedside, resting comfortably.  No specific complaints this morning.  Denies any pain.  Tolerating full liquid diet.  Continues on TPN.  General surgery with some concern of air in ostomy bag this morning.  No further advancement of diet today.  No other specific questions or concerns at this time.  Denies headache, no chest pain, no palpitations, no shortness of breath, no abdominal pain, no dizziness, no fever/chills/night sweats, no nausea/vomiting.  No acute events overnight per nursing staff.  Objective: Vitals:   08/12/21 1932 08/12/21 2316 08/13/21 0344 08/13/21 0818  BP: 122/61 (!) 118/46 (!) 109/57 (!) 114/53  Pulse: (!) 101 85 93 89  Resp: (!) '26 19 20 17  '$ Temp: 97.6 F (36.4 C)  97.6 F (36.4 C) 97.6 F (36.4 C)  TempSrc: Oral  Oral Oral  SpO2: 99% 97% 100% 98%  Weight:      Height:        Intake/Output Summary (Last 24 hours) at 08/13/2021 1035 Last data filed at 08/13/2021 0346 Gross per 24 hour  Intake 1028.86 ml  Output 2900 ml  Net -1871.14 ml   Filed Weights   08/09/21 0500 08/10/21 0500 08/11/21 0407  Weight: (!) 141.2 kg (!) 142.4 kg (!) 142.7 kg    Examination:  General exam: Appears calm and comfortable, chronically ill appearance Respiratory system: Clear to auscultation. Respiratory effort normal.  On room air Cardiovascular system: S1 & S2 heard, RRR. No JVD, murmurs, rubs, gallops or clicks. No pedal edema. Gastrointestinal system: Abdomen is nondistended, soft and nontender. No organomegaly or masses felt. Normal bowel sounds heard.  Ostomy noted with yellow/tan material in ostomy bag with air Central nervous system: Alert and oriented. No focal neurological deficits. Extremities: Symmetric 5 x 5 power. Skin:  No rashes, lesions or ulcers Psychiatry: Judgement and insight appear poor. Mood & affect appropriate.     Data Reviewed: I have personally reviewed following labs and imaging  studies  CBC: Recent Labs  Lab 08/07/21 0600 08/08/21 0500 08/09/21 1545 08/12/21 0350  WBC 10.4 12.7* 8.3 8.1  NEUTROABS 7.7 8.7*  --   --   HGB 8.0* 7.6* 7.8* 7.4*  HCT 27.2* 25.2* 24.8* 25.3*  MCV 93.5 91.3 102.5* 92.7  PLT 236 226 223 [id number]   Basic Metabolic Panel: Recent Labs  Lab 08/07/21 0600 08/08/21 0500 08/09/21 1820 08/10/21 0355 08/11/21 0435 08/12/21 0350 08/13/21 0500  NA 133* 130* 130* 130* 130* 133* 133*  K 4.5 4.6 5.0 4.6 4.5 4.1 4.2  CL 102 98 100 100 98 99 97*  CO2 '27 26 26 26 27 28 26  '$ GLUCOSE 94 100* 85 79 89 105* 101*  BUN '22 20 21 23 18 17 23  '$ CREATININE 0.49 0.52 0.50 0.49 0.49 0.51 0.52  CALCIUM 7.7* 7.7* 7.8* 7.6* 7.7* 7.6* 8.1*  MG 1.9 2.0 2.0 2.0  --   --  1.9  PHOS 3.2 3.0 3.5 3.5  --   --  3.4   GFR: Estimated Creatinine Clearance: 87.4 mL/min (by C-G formula based on SCr of 0.52 mg/dL). Liver Function Tests: Recent Labs  Lab 08/07/21 0600 08/08/21 0500 08/09/21 1820 08/13/21 0500  AST 45* 36 40 23  ALT 32 '28 28 19  '$ ALKPHOS 167* 180* 195* 137*  BILITOT 1.0 1.0 0.8 0.4  PROT 5.9* 5.7* 6.6 5.9*  ALBUMIN 1.4* 1.4* 1.5* 1.5*   No results for input(s): LIPASE, AMYLASE in the last 168 hours. No results for input(s): AMMONIA in the last 168 hours. Coagulation Profile: Recent Labs  Lab 08/08/21 0500  INR 1.2   Cardiac Enzymes: No results for input(s): CKTOTAL, CKMB, CKMBINDEX, TROPONINI in the last 168 hours. BNP (last 3 results) No results for input(s): PROBNP in the last 8760 hours. HbA1C: No results for input(s): HGBA1C in the last 72 hours. CBG: Recent Labs  Lab 08/12/21 0240  GLUCAP 105*   Lipid Profile: Recent Labs    08/13/21 0500  TRIG 62   Thyroid Function Tests: No results for input(s): TSH, T4TOTAL, FREET4, T3FREE, THYROIDAB in the last 72 hours. Anemia Panel: No results for input(s): VITAMINB12, FOLATE, FERRITIN, TIBC, IRON, RETICCTPCT in the last 72 hours. Sepsis Labs: No results for input(s):  PROCALCITON, LATICACIDVEN in the last 168 hours.  No results found for this or any previous visit (from the past 240 hour(s)).       Radiology Studies: CT ABDOMEN PELVIS WO CONTRAST  Result Date: 08/11/2021 CLINICAL DATA:  Persistent post procedural fistula EXAM: CT ABDOMEN AND PELVIS WITHOUT CONTRAST TECHNIQUE: Multidetector CT imaging of the abdomen and pelvis was performed following the standard protocol without IV contrast. COMPARISON:  CT 07/18/2021, 9 4,022 FINDINGS: Lower chest: Persistent LEFT pleural effusion with passive atelectasis of the LEFT lower lobe. Small effusion on the RIGHT. Hepatobiliary: Postcholecystectomy.  No abscess. Pancreas: Pancreas is normal. No ductal dilatation. No pancreatic inflammation. Spleen: Normal spleen Adrenals/urinary tract: Adrenal glands and kidneys are normal. The ureters and bladder normal. There is high-density within the ventral aspect of the bladder which is favored CT streak artifact as seen on lateral projection Stomach/Bowel: Stomach, duodenum small-bowel normal. No evidence of bowel obstruction. Oral contrast transversus to the rectum. No clear communication between the bowel in the cutaneous tissue. Continued reduction in subcutaneous gas along the RIGHT  abdominal wall and groin region. No drainable abscess/fluid collections. Vascular/Lymphatic: Abdominal aorta is normal caliber. There is no retroperitoneal or periportal lymphadenopathy. No pelvic lymphadenopathy. Reproductive: Clear uterus Other: Hematoma in the medial;left thigh is decreased on the partially imaged. For example 1 lobe of the hematoma measures 41 mm decreased from 53. The adjacent lobe measures 51 mm compared to 67 mm. Musculoskeletal: No aggressive osseous lesion. IMPRESSION: 1. Continued decrease in volume extensive subcutaneous gas along the RIGHT abdominal wall and upper groin. 2. No evidence of persistent communication between the bowel and the subcutaneous tissue. 3. No evidence  of bowel obstruction. Contrast traverses to the rectum. 4. Bilobed hematoma in the medial LEFT thigh is decreased volume. 5. Persistent moderate LEFT pleural effusion with LEFT lower lobe passive atelectasis. Electronically Signed   By: Suzy Bouchard M.D.   On: 08/11/2021 17:57   DG CHEST PORT 1 VIEW  Result Date: 08/12/2021 CLINICAL DATA:  Shortness of breath EXAM: PORTABLE CHEST 1 VIEW COMPARISON:  07/27/2021 FINDINGS: Right upper extremity PICC with tip overlying superior vena cava. Unchanged mild cardiomegaly and central congestion. Redemonstrated small left pleural effusion with associated atelectasis. No acute osseous abnormality. IMPRESSION: Cardiomegaly with vascular congestion, small left pleural effusion, and associated atelectasis. Electronically Signed   By: Merilyn Baba M.D.   On: 08/12/2021 02:55        Scheduled Meds:  amiodarone  200 mg Oral Daily   Chlorhexidine Gluconate Cloth  6 each Topical Daily   enoxaparin (LOVENOX) injection  40 mg Subcutaneous Q24H   feeding supplement  237 mL Oral BID BM   levothyroxine  150 mcg Oral Q0600   mouth rinse  15 mL Mouth Rinse BID   pantoprazole  40 mg Oral Q0600   sodium chloride flush  10-40 mL Intracatheter Q12H   torsemide  20 mg Oral Daily   Continuous Infusions:  methocarbamol (ROBAXIN) IV 500 mg (07/25/21 2343)   TPN ADULT (ION) 105 mL/hr at 08/12/21 1736   TPN ADULT (ION)       LOS: 26 days    Time spent: 39 minutes spent on chart review, discussion with nursing staff, consultants, updating family and interview/physical exam; more than 50% of that time was spent in counseling and/or coordination of care.    Lada Fulbright J [place] DO [facility] Available via Epic secure chat 7am-7pm After these hours, please refer to coverage provider listed on [email or web address] 08/13/2021, 10:35 AM

## 2021-08-13 NOTE — Progress Notes (Signed)
Subjective: CC: Tolerating fld without n/v. No abdominal pain. Reports air in eakin's pouch this am that is new.   Objective: Vital signs in last 24 hours: Temp:  [97.6 F (36.4 C)-97.8 F (36.6 C)] 97.6 F (36.4 C) (09/19 0344) Pulse Rate:  [85-106] 93 (09/19 0344) Resp:  [19-26] 20 (09/19 0344) BP: (107-122)/(46-68) 109/57 (09/19 0344) SpO2:  [97 %-100 %] 100 % (09/19 0344) FiO2 (%):  [5 %] 5 % (09/19 0344) Last BM Date: 08/11/21  Intake/Output from previous day: 09/18 0701 - 09/19 0700 In: 1028.9 [I.V.:1028.9] Out: 4400 [Urine:4400] Intake/Output this shift: No intake/output data recorded.  PE: Gen:  Alert, NAD, pleasant Pulm:  rate and effort normal Abd: obese, soft, ND, NT, +BS, eakins pouch w/ small amount of tan output as noted below along with air in bag     Lab Results:  Recent Labs    08/12/21 0350  WBC 8.1  HGB 7.4*  HCT 25.3*  PLT 259   BMET Recent Labs    08/12/21 0350 08/13/21 0500  NA 133* 133*  K 4.1 4.2  CL 99 97*  CO2 28 26  GLUCOSE 105* 101*  BUN 17 23  CREATININE 0.51 0.52  CALCIUM 7.6* 8.1*   PT/INR No results for input(s): LABPROT, INR in the last 72 hours. CMP     Component Value Date/Time   NA 133 (L) 08/13/2021 0500   K 4.2 08/13/2021 0500   CL 97 (L) 08/13/2021 0500   CO2 26 08/13/2021 0500   GLUCOSE 101 (H) 08/13/2021 0500   BUN 23 08/13/2021 0500   CREATININE 0.52 08/13/2021 0500   CALCIUM 8.1 (L) 08/13/2021 0500   PROT 5.9 (L) 08/13/2021 0500   ALBUMIN 1.5 (L) 08/13/2021 0500   AST 23 08/13/2021 0500   ALT 19 08/13/2021 0500   ALKPHOS 137 (H) 08/13/2021 0500   BILITOT 0.4 08/13/2021 0500   GFRNONAA >60 08/13/2021 0500   GFRAA >90 03/28/2014 0435   Lipase     Component Value Date/Time   LIPASE 25 03/27/2014 0600    Studies/Results: CT ABDOMEN PELVIS WO CONTRAST  Result Date: 08/11/2021 CLINICAL DATA:  Persistent post procedural fistula EXAM: CT ABDOMEN AND PELVIS WITHOUT CONTRAST TECHNIQUE:  Multidetector CT imaging of the abdomen and pelvis was performed following the standard protocol without IV contrast. COMPARISON:  CT 07/18/2021, 9 4,022 FINDINGS: Lower chest: Persistent LEFT pleural effusion with passive atelectasis of the LEFT lower lobe. Small effusion on the RIGHT. Hepatobiliary: Postcholecystectomy.  No abscess. Pancreas: Pancreas is normal. No ductal dilatation. No pancreatic inflammation. Spleen: Normal spleen Adrenals/urinary tract: Adrenal glands and kidneys are normal. The ureters and bladder normal. There is high-density within the ventral aspect of the bladder which is favored CT streak artifact as seen on lateral projection Stomach/Bowel: Stomach, duodenum small-bowel normal. No evidence of bowel obstruction. Oral contrast transversus to the rectum. No clear communication between the bowel in the cutaneous tissue. Continued reduction in subcutaneous gas along the RIGHT abdominal wall and groin region. No drainable abscess/fluid collections. Vascular/Lymphatic: Abdominal aorta is normal caliber. There is no retroperitoneal or periportal lymphadenopathy. No pelvic lymphadenopathy. Reproductive: Clear uterus Other: Hematoma in the medial;left thigh is decreased on the partially imaged. For example 1 lobe of the hematoma measures 41 mm decreased from 53. The adjacent lobe measures 51 mm compared to 67 mm. Musculoskeletal: No aggressive osseous lesion. IMPRESSION: 1. Continued decrease in volume extensive subcutaneous gas along the RIGHT abdominal wall and upper groin.  2. No evidence of persistent communication between the bowel and the subcutaneous tissue. 3. No evidence of bowel obstruction. Contrast traverses to the rectum. 4. Bilobed hematoma in the medial LEFT thigh is decreased volume. 5. Persistent moderate LEFT pleural effusion with LEFT lower lobe passive atelectasis. Electronically Signed   By: Suzy Bouchard M.D.   On: 08/11/2021 17:57   DG CHEST PORT 1 VIEW  Result Date:  08/12/2021 CLINICAL DATA:  Shortness of breath EXAM: PORTABLE CHEST 1 VIEW COMPARISON:  07/27/2021 FINDINGS: Right upper extremity PICC with tip overlying superior vena cava. Unchanged mild cardiomegaly and central congestion. Redemonstrated small left pleural effusion with associated atelectasis. No acute osseous abnormality. IMPRESSION: Cardiomegaly with vascular congestion, small left pleural effusion, and associated atelectasis. Electronically Signed   By: Merilyn Baba M.D.   On: 08/12/2021 02:55    Anti-infectives: Anti-infectives (From admission, onward)    Start     Dose/Rate Route Frequency Ordered Stop   08/12/21 0415  clindamycin (CLEOCIN) IVPB 600 mg        600 mg 100 mL/hr over 30 Minutes Intravenous  Once 08/12/21 0317 08/12/21 0434   07/20/21 1000  remdesivir 100 mg in sodium chloride 0.9 % 100 mL IVPB       See Hyperspace for full Linked Orders Report.   100 mg 200 mL/hr over 30 Minutes Intravenous Daily 07/19/21 1040 07/21/21 1148   07/20/21 0200  vancomycin (VANCOREADY) IVPB 750 mg/150 mL  Status:  Discontinued        750 mg 150 mL/hr over 60 Minutes Intravenous Every 24 hours 07/19/21 1036 07/19/21 1449   07/19/21 2300  anidulafungin (ERAXIS) 100 mg in sodium chloride 0.9 % 100 mL IVPB  Status:  Discontinued        100 mg 78 mL/hr over 100 Minutes Intravenous Every 24 hours 07/18/21 2107 07/19/21 1449   07/19/21 1600  vancomycin (VANCOREADY) IVPB 750 mg/150 mL  Status:  Discontinued        750 mg 150 mL/hr over 60 Minutes Intravenous Every 24 hours 07/19/21 0919 07/19/21 1016   07/19/21 1600  vancomycin (VANCOREADY) IVPB 750 mg/150 mL  Status:  Discontinued        750 mg 150 mL/hr over 60 Minutes Intravenous Every 24 hours 07/19/21 1016 07/19/21 1036   07/19/21 1400  piperacillin-tazobactam (ZOSYN) IVPB 3.375 g  Status:  Discontinued        3.375 g 12.5 mL/hr over 240 Minutes Intravenous Every 8 hours 07/19/21 1016 07/31/21 1501   07/19/21 1130  remdesivir 200 mg in  sodium chloride 0.9% 250 mL IVPB       See Hyperspace for full Linked Orders Report.   200 mg 580 mL/hr over 30 Minutes Intravenous Once 07/19/21 1040 07/19/21 1241   07/19/21 0100  metroNIDAZOLE (FLAGYL) IVPB 500 mg  Status:  Discontinued        500 mg 100 mL/hr over 60 Minutes Intravenous Every 12 hours 07/18/21 1203 07/18/21 1534   07/18/21 2200  ceFEPIme (MAXIPIME) 2 g in sodium chloride 0.9 % 100 mL IVPB  Status:  Discontinued        2 g 200 mL/hr over 30 Minutes Intravenous Every 12 hours 07/18/21 1156 07/18/21 1203   07/18/21 2200  meropenem (MERREM) 2 g in sodium chloride 0.9 % 100 mL IVPB  Status:  Discontinued        2 g 200 mL/hr over 30 Minutes Intravenous Every 12 hours 07/18/21 2104 07/19/21 1011   07/18/21 2200  anidulafungin (  ERAXIS) 200 mg in sodium chloride 0.9 % 200 mL IVPB        200 mg 78 mL/hr over 200 Minutes Intravenous  Once 07/18/21 2108 07/19/21 0419   07/18/21 1800  clindamycin (CLEOCIN) IVPB 900 mg  Status:  Discontinued        900 mg 100 mL/hr over 30 Minutes Intravenous Every 8 hours 07/18/21 1534 07/18/21 2103   07/18/21 1600  vancomycin (VANCOREADY) IVPB 2000 mg/400 mL  Status:  Discontinued        2,000 mg 200 mL/hr over 120 Minutes Intravenous Every 24 hours 07/18/21 1544 07/19/21 0919   07/18/21 1545  piperacillin-tazobactam (ZOSYN) IVPB 3.375 g  Status:  Discontinued        3.375 g 12.5 mL/hr over 240 Minutes Intravenous Every 8 hours 07/18/21 1544 07/18/21 2104   07/18/21 1300  cefTRIAXone (ROCEPHIN) 2 g in sodium chloride 0.9 % 100 mL IVPB  Status:  Discontinued        2 g 200 mL/hr over 30 Minutes Intravenous Every 24 hours 07/18/21 1203 07/18/21 1628   07/18/21 1200  ceFEPIme (MAXIPIME) 2 g in sodium chloride 0.9 % 100 mL IVPB  Status:  Discontinued        2 g 200 mL/hr over 30 Minutes Intravenous Every 12 hours 07/18/21 1155 07/18/21 1156   07/18/21 0930  ceFEPIme (MAXIPIME) 2 g in sodium chloride 0.9 % 100 mL IVPB        2 g 200 mL/hr over  30 Minutes Intravenous  Once 07/18/21 0918 07/18/21 1135   07/18/21 0930  metroNIDAZOLE (FLAGYL) IVPB 500 mg  Status:  Discontinued        500 mg 100 mL/hr over 60 Minutes Intravenous  Once 07/18/21 [id number] 07/18/21 1534        Assessment/Plan 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 Arnoldo Morale at [facility] on 8/12 - path w/ adenocarcinoma. Oncology has seen on 9/5 - Midline wound opened by Dr. Arnoldo Morale at bedside 8/24 which had stool decompressing through CC fistula  - Currently has eakin's pouch. pouch changed 9/15 - pic in media section and note 9/17  - CT 9/17 w/ overall improvement and no sign of persistent communication between bowel lumen and wound  - Although imaging reassuring, given air and output still in eakin's pouch, will hold at full liquid diet and continue TPN    FEN - FLD. TPN. IVF per primary VTE - SCDs, Lovenox, per primary  ID - None currently.    COVID + A. Fib on Xarelto at home  ABL anemia - FOBT positive 8/24 Hypothyroidism Severe protein calorie malnutrition - albumin 1.3. Pre-alb <5. On TNA     LOS: 26 days    Jillyn Ledger , [facility] 08/13/2021, 8:18 AM Please see Amion for pager number during day hours 7:00am-4:30pm

## 2021-08-13 NOTE — Progress Notes (Signed)
PT Cancellation Note  Patient Details Name: Lauren [name] MRN: [id number] DOB: [birth year]   Cancelled Treatment:    Reason Eval/Treat Not Completed: Patient declined, no reason specified. Pt stating, "I have been resting the last few days and it is doing me good. I still need some more. I don't want to start moving again too quickly. I would rather take longer and do it right." Pt educated on importance of mobility and encouragement provided, but pt continued to decline.   [name] 08/13/2021, 11:29 AM  Lorrin Goodell, PT  Office # [phone] Pager [phone]

## 2021-08-13 NOTE — Progress Notes (Signed)
PHARMACY - TOTAL PARENTERAL NUTRITION CONSULT NOTE  Indication: Fistula  Patient Measurements: Height: 5' 2" (157.5 cm) Weight: (!) 142.7 kg (314 lb 9.5 oz) IBW/kg (Calculated) : 50.1 TPN AdjBW (KG): 66.6 Body mass index is 57.54 kg/m.   Assessment:  74 YOF with recent R-hemicolectomy on 8/12 in the setting of a cecal mass - consistent with R-colon metastatic adenocarcinoma. 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 Pharmacy consulted to manage TPN for nutritional support in the setting of a colocutaneous fistula.    Glucose / Insulin: CBGs controlled on low end, SSI d/c'd 8/28 Electrolytes: Na 133 (on max), K 4.2 stable (goal >/=4 for Afib), Cl 97 low, Mag 1.9 (goal >/= 2 for Afib),  CoCa 10.1 (low albumin 1.5) Renal:  SCr < 1, BUN WNL Hepatic: LFTs / Tbili / TG WNL, alk phos 137 down, albumin 1.5 Intake / Output; MIVF: Drains 0, UOP 4400ml (1.3) . LBM 9/18 x2 - poor appetite with 0 intake noted yesterday, except 1 Ensure GI Meds: po PPI  GI Imaging: 8/24 Abd CT: colocutaneous fistula, 7.4cm abd wall abscess, soft tissue edema, cellulitis vs nec fasciitis 8/25 Abd xray: diffuse abd wall emphysema, nonobstructive bowel gas pattern 9/4 CT abd/pelvis: medial upper left thigh hematoma 9/17: CT abd - No evidence of communication between bowel and subQ tissue. Continued decrease in volume extensive subcutaneous gas along the RIGHT abdominal wall and upper groin.  GI Surgeries / Procedures: 8/12 at [facility]: R-hemicolectomy 8/24: Midline wound opened by Dr. Jenkins at bedside  Central access: PICC replaced 07/27/21 TPN start date: 07/19/21  Nutritional Goals (RD assessment on 9/16): 2300-2500 kcal, 140-160g AA, fluid >/= 2L per day  Current Nutrition:  TPN Advanced to FLD 9/13 - not taking in much Ensure BID   Plan:  Continue TPN at goal rate of 105 mL/hr at 1800 tonight, providing 151g AA, 70.6g Lipids (29%), 312g Dex (GIR  1.5) and 2373 kCal meeting 100% of estimated needs Electrolytes in TPN (No changes today) - cont Na to 150 mEq/L (MAX), K 45 mEq/L, Ca 4mEq/L, Mg 15mEq/L, Phos 22mmol/L, incr Cl:Ac 2:1 Add standard MVI and trace elements to TPN TPN labs in AM F/u better intake for TPN weaning    S. , PharmD, BCPS Clinical Staff Pharmacist [email or web address] 08/13/2021 7:59 AM       

## 2021-08-13 NOTE — Consult Note (Signed)
[place] Nurse wound follow up Wound type: Abdominal Fistula Due to the stability of the pouching of the abdominal fistula, I am signing the [place] service off.  Please reconsult if needed. [place] nurse will not follow at this time.  Please re-consult the [place] team if needed.  Val Riles, RN, MSN, CWOCN, CNS-BC, pager [phone]

## 2021-08-14 DIAGNOSIS — A419 Sepsis, unspecified organism: Secondary | ICD-10-CM | POA: Diagnosis not present

## 2021-08-14 DIAGNOSIS — R6521 Severe sepsis with septic shock: Secondary | ICD-10-CM | POA: Diagnosis not present

## 2021-08-14 LAB — BASIC METABOLIC PANEL
Anion gap: 7 (ref 5–15)
BUN: 29 mg/dL — ABNORMAL HIGH (ref 8–23)
CO2: 30 mmol/L (ref 22–32)
Calcium: 8 mg/dL — ABNORMAL LOW (ref 8.9–10.3)
Chloride: 96 mmol/L — ABNORMAL LOW (ref 98–111)
Creatinine, Ser: 0.62 mg/dL (ref 0.44–1.00)
GFR, Estimated: >60 mL/min (ref >60)
Glucose, Bld: 75 mg/dL (ref 70–99)
Potassium: 3.9 mmol/L (ref 3.5–5.1)
Sodium: 133 mmol/L — ABNORMAL LOW (ref 135–145)

## 2021-08-14 LAB — MAGNESIUM: Magnesium: 2 mg/dL (ref 1.7–2.4)

## 2021-08-14 MED ORDER — LORATADINE 10 MG PO TABS
10.0000 mg | ORAL_TABLET | Freq: Every day | ORAL | Status: DC
  Administered 2021-08-14 – 2021-09-07 (×25): 10 mg via ORAL
  Filled 2021-08-14 (×25): qty 1

## 2021-08-14 MED ORDER — CITALOPRAM HYDROBROMIDE 20 MG PO TABS
20.0000 mg | ORAL_TABLET | Freq: Every day | ORAL | Status: DC
  Administered 2021-08-14 – 2021-09-07 (×25): 20 mg via ORAL
  Filled 2021-08-14 (×25): qty 1

## 2021-08-14 MED ORDER — TRAVASOL 10 % IV SOLN
INTRAVENOUS | Status: AC
  Filled 2021-08-14: qty 1512

## 2021-08-14 NOTE — Plan of Care (Signed)
  Problem: Nutrition: Goal: Adequate nutrition will be maintained Outcome: Progressing   Problem: Coping: Goal: Level of anxiety will decrease Outcome: Progressing   Problem: Pain Managment: Goal: General experience of comfort will improve Outcome: Progressing   Problem: Safety: Goal: Ability to remain free from injury will improve Outcome: Progressing   Problem: Skin Integrity: Goal: Risk for impaired skin integrity will decrease Outcome: Progressing   Problem: Activity: Goal: Ability to tolerate increased activity will improve Outcome: Progressing

## 2021-08-14 NOTE — Progress Notes (Signed)
PROGRESS NOTE    HILDEGARDE DUNAWAY  XBD:[id number] DOB: 1946-02-18 DOA: 07/18/2021 PCP: [name], No    Brief Narrative:  Lauren [name] is a 75 year old female with past medical history significant for proximal atrial fibrillation, chronic combined systolic and diastolic congestive heart failure, benign essential hypertension, lymphedema, obesity who presented to [facility] ED on 8/34 from [place] after she was found to be hypotensive.  Patient was complaining of nausea, abdominal pain, right-sided back pain.  CT abdomen/pelvis showing fistula extending from bowel adjacent to suture through an umbilical hernia into the abdominal wall with evidence for connection to the overlying skin and fluid collection and gas tracking anterior abdominal wall concerning for necrotizing fasciitis.  Patient was initially admitted to the PCCM service at [facility] for sepsis secondary to colocutaneous fistula with surrounding cellulitis with possible necrotizing fasciitis of abdominal wall.;  Started on empiric antibiotics with meropenem, vancomycin, anidulafungin, and norepinephrine for vasopressor support.  General surgery was consulted.  Patient was transferred from [facility] to [facility], ICU.  Patient was started on TPN 8/25.  Transferred from PCCM service to [facility] on 8/27.   Assessment & Plan:   Principal Problem:   Septic shock ([place]) Active Problems:   Hypokalemia   Morbid obesity with BMI of 50.0-59.9, adult (HCC)   Colon carcinoma ([place])   Enterocutaneous fistula   Cellulitis   AKI (acute kidney injury) ([place])   Hyponatremia   Abdominal pain   Septic shock, POA Colocutaneous fistula with surrounding cellulitis/abscess Patient initially presenting to [facility] with hypotension and found to have colocutaneous fistula with surrounding cellulitis/abscess and initial concern for necrotizing fasciitis.  Was seen by general surgery, Dr. Arnoldo Morale who performed a bedside  decompression of abscess.  Patient was initially placed on empiric antibiotics with meropenem, vancomycin and anidulafungin with vasopressor support with norepinephrine.  Transferred to [facility] intensive care unit. Norepinephrine titrated off; completed course of empiric antibiotics.  Repeat CT abdomen/pelvis 9/17 with overall improvement and no sign of persistent communication between bowel movement and wound. --General surgery following, appreciate assistance --Continue TPN, pharmacy consulted for management --Currently holding Full liquid diet for continued output in Eakins pouch; further advancement per General Surgery --Continue monitor ostomy/eakin's pouch output --Supportive care  Acute blood loss anemia Left thigh hematoma CT scan abdomen/pelvis notable for left thigh hematoma.  Evaluated by orthopedics, Dr. Ninfa Linden who recommended continued observation with no recommendation of surgical intervention to evacuate any type of hematoma.  Transfused 4u pRBCs; last 07/29/2021. Hemoglobin stable; 7.4. --Continue to monitor CBC every 3 days  Atrial fibrillation with RVR Seen by cardiology, now signed off 08/03/2021.  Was on Xarelto outpatient.  Amiodarone drip now converted to amiodarone 200 mg p.o. daily and metoprolol tartrate 12.5 mg p.o. twice daily.  Chronic combined systolic/diastolic congestive heart failure. TTE 06/29/2021 in care everywhere with LVEF 30%, mild/moderate MVR, LA severely dilated. Evaluated by cardiology, not a candidate for invasive or aggressive intervention. --Strict I's and O's and daily weights  Hx colon adenocarcinoma Patient underwent right hemicolectomy for adenocarcinoma of the cecum on 07/06/2021.  Seen by medical oncology, Dr. Chryl Heck on 07/30/2021.  From the adenocarcinoma standpoint, no evidence of distant metastasis with overall good prognosis. --Plan outpatient follow-up medical oncology in [place].  Acute renal failure: Resolved Etiology likely secondary to  ATN from septic shock as above. --Cr 1.58>>0.62 --Continue monitor BMP closely  Hypothyroidism:  TSH elevated 10.3 on admission. --Levothyroxine increased to 163mcg PO daily --Will need  repeat TFTs 4-6 weeks outpatient  Acute delirium: Resolved  GERD: Continue PPI  COVID-19 viral infection, incidental. Completed course of remdesivir.  Oxygenating well on room air.  Anxiety/depression --Starting Celexa 20 mg p.o. daily --Ativan as needed  Hyponatremia Hypokalemia Hypomagnesmia Repleted during hospitalization.  Currently on TPN, managed by pharmacy.  Weakness/debility/deconditioning: --PT/OT recommending SNF placement. --Social work for assistance --Continue therapy while inpatient  Morbid obesity Body mass index is 58.87 kg/m.  Discussed with patient needs for aggressive lifestyle changes/weight loss as this complicates all facets of care.  Outpatient follow-up with PCP.   Goals of care Patient was seen by palliative care.  CODE STATUS changed to DNR/DNI.    DVT prophylaxis: enoxaparin (LOVENOX) injection 40 mg Start: 08/12/21 1000 SCDs Start: 08/12/21 0802 Place and maintain sequential compression device Start: 07/18/21 1202 Place TED hose Start: 07/18/21 1202   Code Status: DNR Family Communication: No family present at bedside this morning  Disposition Plan:  Level of care: Progressive Status is: Inpatient  Remains inpatient appropriate because:Unsafe d/c plan, IV treatments appropriate due to intensity of illness or inability to take PO, and Inpatient level of care appropriate due to severity of illness  Dispo: The patient is from: Home              Anticipated d/c is to: SNF              Patient currently is not medically stable to d/c.   Difficult to place patient No    Consultants:  PCCM - signed off 8/27 General surgery Palliative care Cardiology  -  signed off 9/9  Procedures:  PICC  Antimicrobials:  Vancomycin 8/24 - 8/25 Cefepime 8/24 -  8/24 Metronidazole 8/24 - 8/24 Meropenem 8/25 - 8/26 Clindamycin 8 /24 - 8/24 Zosyn 8/25 - 9/6 Anidulafungin 8/24 - 8/25   Subjective: Patient seen examined at bedside, resting comfortably.  Patient requesting more water to drink this morning.  Seen by general surgery, now placed back to n.p.o. due to increased/continued output in Eakin's pouch.    Continues on TPN. No other specific questions or concerns at this time.  Denies headache, no chest pain, no palpitations, no shortness of breath, no abdominal pain, no dizziness, no fever/chills/night sweats, no nausea/vomiting.  No acute events overnight per nursing staff.  Objective: Vitals:   08/13/21 2329 08/14/21 0411 08/14/21 0500 08/14/21 0820  BP: 110/61 117/78  (!) 111/49  Pulse: (!) 111 87  63  Resp: 17 18    Temp: 97.8 F (36.6 C) 97.7 F (36.5 C)    TempSrc: Oral Oral    SpO2: 96% 98%    Weight:   (!) 146 kg   Height:        Intake/Output Summary (Last 24 hours) at 08/14/2021 1204 Last data filed at 08/14/2021 0650 Gross per 24 hour  Intake 1306.55 ml  Output 3585 ml  Net -2278.45 ml   Filed Weights   08/10/21 0500 08/11/21 0407 08/14/21 0500  Weight: (!) 142.4 kg (!) 142.7 kg (!) 146 kg    Examination:  General exam: Appears calm and comfortable, chronically ill appearance Respiratory system: Clear to auscultation. Respiratory effort normal.  On room air Cardiovascular system: S1 & S2 heard, RRR. No JVD, murmurs, rubs, gallops or clicks. No pedal edema. Gastrointestinal system: Abdomen is nondistended, soft and nontender. No organomegaly or masses felt. Normal bowel sounds heard.  Eakin's pouch noted with yellow/tan material in bag with air Central nervous system: Alert and oriented. No  focal neurological deficits. Extremities: Symmetric 5 x 5 power. Skin: No rashes, lesions or ulcers Psychiatry: Judgement and insight appear poor. Mood & affect appropriate.     Data Reviewed: I have personally reviewed  following labs and imaging studies  CBC: Recent Labs  Lab 08/08/21 0500 08/09/21 1545 08/12/21 0350  WBC 12.7* 8.3 8.1  NEUTROABS 8.7*  --   --   HGB 7.6* 7.8* 7.4*  HCT 25.2* 24.8* 25.3*  MCV 91.3 102.5* 92.7  PLT 226 223 016   Basic Metabolic Panel: Recent Labs  Lab 08/08/21 0500 08/09/21 1820 08/10/21 0355 08/11/21 0435 08/12/21 0350 08/13/21 0500 08/14/21 0500  NA 130* 130* 130* 130* 133* 133* 133*  K 4.6 5.0 4.6 4.5 4.1 4.2 3.9  CL 98 100 100 98 99 97* 96*  CO2 26 26 26 27 28 26 30   GLUCOSE 100* 85 79 89 105* 101* 75  BUN 20 21 23 18 17 23  29*  CREATININE 0.52 0.50 0.49 0.49 0.51 0.52 0.62  CALCIUM 7.7* 7.8* 7.6* 7.7* 7.6* 8.1* 8.0*  MG 2.0 2.0 2.0  --   --  1.9 2.0  PHOS 3.0 3.5 3.5  --   --  3.4  --    GFR: Estimated Creatinine Clearance: 88.8 mL/min (by C-G formula based on SCr of 0.62 mg/dL). Liver Function Tests: Recent Labs  Lab 08/08/21 0500 08/09/21 1820 08/13/21 0500  AST 36 40 23  ALT 28 28 19   ALKPHOS 180* 195* 137*  BILITOT 1.0 0.8 0.4  PROT 5.7* 6.6 5.9*  ALBUMIN 1.4* 1.5* 1.5*   No results for input(s): LIPASE, AMYLASE in the last 168 hours. No results for input(s): AMMONIA in the last 168 hours. Coagulation Profile: Recent Labs  Lab 08/08/21 0500  INR 1.2   Cardiac Enzymes: No results for input(s): CKTOTAL, CKMB, CKMBINDEX, TROPONINI in the last 168 hours. BNP (last 3 results) No results for input(s): PROBNP in the last 8760 hours. HbA1C: No results for input(s): HGBA1C in the last 72 hours. CBG: Recent Labs  Lab 08/12/21 0240  GLUCAP 105*   Lipid Profile: Recent Labs    08/13/21 0500  TRIG 62   Thyroid Function Tests: No results for input(s): TSH, T4TOTAL, FREET4, T3FREE, THYROIDAB in the last 72 hours. Anemia Panel: No results for input(s): VITAMINB12, FOLATE, FERRITIN, TIBC, IRON, RETICCTPCT in the last 72 hours. Sepsis Labs: No results for input(s): PROCALCITON, LATICACIDVEN in the last 168 hours.  No results  found for this or any previous visit (from the past 240 hour(s)).       Radiology Studies: No results found.      Scheduled Meds:  amiodarone  200 mg Oral Daily   Chlorhexidine Gluconate Cloth  6 each Topical Daily   citalopram  20 mg Oral Daily   enoxaparin (LOVENOX) injection  40 mg Subcutaneous Q24H   feeding supplement  237 mL Oral BID BM   levothyroxine  150 mcg Oral Q0600   loratadine  10 mg Oral Daily   mouth rinse  15 mL Mouth Rinse BID   metoprolol tartrate  12.5 mg Oral BID   pantoprazole  40 mg Oral Q0600   sodium chloride flush  10-40 mL Intracatheter Q12H   torsemide  20 mg Oral Daily   Continuous Infusions:  methocarbamol (ROBAXIN) IV 500 mg (07/25/21 2343)   TPN ADULT (ION) 105 mL/hr at 08/13/21 1810   TPN ADULT (ION)       LOS: 27 days    Time spent:  37 minutes spent on chart review, discussion with nursing staff, consultants, updating family and interview/physical exam; more than 50% of that time was spent in counseling and/or coordination of care.    Bay Wayson J [place] DO [facility] Available via Epic secure chat 7am-7pm After these hours, please refer to coverage provider listed on [email or web address] 08/14/2021, 12:04 PM

## 2021-08-14 NOTE — Progress Notes (Signed)
Occupational Therapy Treatment Patient Details Name: Lauren [name] MRN: [id number] DOB: 10/13/1946 Today's Date: 08/14/2021   History of present illness 75 yo female admitted 0/81 from SNF [facility]) with hypotension and Covid (+). Pt with colocutaneous fistula, Afib and new CHf since admission. Pt also with abdominal wall cellulitis and left thigh hematoma requiring blood transfusion. Pt with recent admission (8/9-8/23) to [facility] with partially obstructing tumor in the ascending colon s/p R hemicolectomy 07/06/21. PMH: A fib, morbid obesity, hypertension, lymphedema, hypothyroidism   OT comments  Patient was resistant to get out of bed due to personal issues.  Patient was willing to get to eob and transfer to chair.  Once patient was up it was apparent she had soiled bed. Patient stood for cleaning and performed steps towards recliner.  Recommended to nursing to allow patient time in chair to increase time out of bed.  Acute OT to continue to follow.     Recommendations for follow up therapy are one component of a multi-disciplinary discharge planning process, led by the attending physician.  Recommendations may be updated based on patient status, additional functional criteria and insurance authorization.    Follow Up Recommendations  SNF    Equipment Recommendations  Wheelchair (measurements OT);Wheelchair cushion (measurements OT)    Recommendations for Other Services      Precautions / Restrictions Precautions Precautions: Fall;Other (comment) Precaution Comments: painful LLE, bil LE lymphedema, LUE cellulitis, ostomy, incontinent       Mobility Bed Mobility Overal bed mobility: Needs Assistance Bed Mobility: Supine to Sit     Supine to sit: Min assist;+2 for physical assistance;HOB elevated Sit to supine: Max assist;+2 for physical assistance   General bed mobility comments: HOB 40 degrees with increased time with assist of LLE to pivot toward right side of bed with HHA to  elevate trunk and fully scoot to EOB. Pt did not follow cues for rolling or use of rail with transition to sitting. MOd assist with reciprocal scooting to slide back in chair    Transfers Overall transfer level: Needs assistance Equipment used: Rolling walker (2 wheeled) Transfers: Sit to/from Stand Sit to Stand: Min assist;+2 safety/equipment         General transfer comment: pt able to stand from EOB with min assist Left knee blocked and cues for hand placement. Pt stood grossly 3 min for peri care in static standing due to incontinent BM. Pt then able to step grossly 3' from bed to chair with use of RW and min assist +2 for lines and safety    Balance Overall balance assessment: Needs assistance Sitting-balance support: Feet supported;No upper extremity supported Sitting balance-Leahy Scale: Fair Sitting balance - Comments: able to sit EOB and at edge of chair without UE support   Standing balance support: Bilateral upper extremity supported Standing balance-Leahy Scale: Poor Standing balance comment: bil UE support in standing                           ADL either performed or assessed with clinical judgement   ADL Overall ADL's : Needs assistance/impaired             Lower Body Bathing: Maximal assistance;Sit to/from stand Lower Body Bathing Details (indicate cue type and reason): patient had soiled self in bed and stood for cleaning Upper Body Dressing : Minimal assistance Upper Body Dressing Details (indicate cue type and reason): Donned gown Lower Body Dressing: Maximal assistance;Bed level Lower Body Dressing Details (  indicate cue type and reason): donn footwear             Functional mobility during ADLs: Minimal assistance;Rolling walker General ADL Comments: transferred to recliner due to soiled bed     Vision       Perception     Praxis      Cognition Arousal/Alertness: Awake/alert Behavior During Therapy: Flat affect;Anxious Overall  Cognitive Status: Impaired/Different from baseline Area of Impairment: Safety/judgement                         Safety/Judgement: Decreased awareness of safety;Decreased awareness of deficits     General Comments: pt initially stating she needs to get her head straight and right with Jesus before she can move. Lots of excuses not to move with direct education on why her body needs movement for her to attempt and engage. Pt fearful and reassured throughout. Pt also with incontinent stool with mobility and unaware        Exercises    Shoulder Instructions       General Comments      Pertinent Vitals/ Pain       Pain Assessment: Faces Pain Score: 5  Faces Pain Scale: Hurts even more Pain Location: L LE Pain Descriptors / Indicators: Discomfort;Grimacing;Guarding;Moaning Pain Intervention(s): Repositioned  Home Living                                          Prior Functioning/Environment              Frequency  Min 1X/week        Progress Toward Goals  OT Goals(current goals can now be found in the care plan section)  Progress towards OT goals: Progressing toward goals  Acute Rehab OT Goals Patient Stated Goal: to get better OT Goal Formulation: With patient Time For Goal Achievement: 08/11/21 Potential to Achieve Goals: Good ADL Goals Pt Will Perform Grooming: with min guard assist;sitting Additional ADL Goal #1: Max A of one for bed mobility to reduce caregiver burden. Additional ADL Goal #2: Patient will sit edge of bed for up to 5 min to increase independence with grooming and bed mobility.  Plan Discharge plan remains appropriate    Co-evaluation    PT/OT/SLP Co-Evaluation/Treatment: Yes Reason for Co-Treatment: Complexity of the patient's impairments (multi-system involvement) PT goals addressed during session: Mobility/safety with mobility;Proper use of DME;Strengthening/ROM OT goals addressed during session: ADL's and  self-care      AM-PAC OT "6 Clicks" Daily Activity     Outcome Measure   Help from another person eating meals?: A Little Help from another person taking care of personal grooming?: A Little Help from another person toileting, which includes using toliet, bedpan, or urinal?: Total Help from another person bathing (including washing, rinsing, drying)?: A Lot Help from another person to put on and taking off regular upper body clothing?: A Lot Help from another person to put on and taking off regular lower body clothing?: Total 6 Click Score: 12    End of Session Equipment Utilized During Treatment: Rolling walker  OT Visit Diagnosis: Other abnormalities of gait and mobility (R26.89);Muscle weakness (generalized) (M62.81);Other symptoms and signs involving cognitive function;Pain;Adult, failure to thrive (R62.7) Pain - Right/Left: Left Pain - part of body: Hip   Activity Tolerance Patient tolerated treatment well   Patient Left in chair;with call  bell/phone within reach;with chair alarm set   Nurse Communication Other (comment) (recommended leaving patient in chair 1 hour before returning to bed)        Time: 8592-9244 OT Time Calculation (min): 38 min  Charges: OT General Charges $OT Visit: 1 Visit OT Treatments $Self Care/Home Management : 8-22 mins  Lodema Hong, OTA   Ridgely Anastacio Alexis Goodell 08/14/2021, 11:19 AM

## 2021-08-14 NOTE — Progress Notes (Signed)
PHARMACY - TOTAL PARENTERAL NUTRITION CONSULT NOTE  Indication: Fistula  Patient Measurements: Height: _0  (157.5 cm) Weight: (!) 146 kg (321 lb 14 oz) IBW/kg (Calculated) : 50.1 TPN AdjBW (KG): 66.6 Body mass index is 58.87 kg/m.   Assessment:  20 YOF with recent R-hemicolectomy on 8/12 in the setting of a cecal mass - consistent with R-colon metastatic adenocarcinoma. 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 Pharmacy consulted to manage TPN for nutritional support in the setting of a colocutaneous fistula.    Glucose / Insulin: CBGs controlled on BMETs, SSI d/c'd 8/28 Electrolytes: Na 133 (on max), K 3.9 (goal >/=4 for Afib), Cl 96 low, Mag 2 (goal >/= 2 for Afib),  CoCa 10.1 (low albumin 1.5) Renal:  SCr < 1, BUN up to 29 Hepatic: LFTs / Tbili / TG WNL, alk phos 137 down, albumin 1.5 Intake / Output; MIVF: Drains 35, UOP 3550m (1) . LBM 9/19 x1 - poor appetite with 0 intake noted yesterday, except 2 Ensure GI Meds: po PPI  GI Imaging: 8/24 Abd CT: colocutaneous fistula, 7.4cm abd wall abscess, soft tissue edema, cellulitis vs nec fasciitis 8/25 Abd xray: diffuse abd wall emphysema, nonobstructive bowel gas pattern 9/4 CT abd/pelvis: medial upper left thigh hematoma 9/17: CT abd - No evidence of communication between bowel and subQ tissue. Continued decrease in volume extensive subcutaneous gas along the RIGHT abdominal wall and upper groin.  [facility]: Midline wound opened by Dr. JArnoldo Moraleat bedside  Central access: PICC replaced 07/27/21 TPN start date: 07/19/21  Nutritional Goals (RD assessment on 9/16): 2300-2500 kcal, 140-160g AA, fluid >/= 2L per day  Current Nutrition:  TPN Advanced to FLD 9/13 - not taking in much Ensure BID   Plan:  Continue TPN at goal rate of 105 mL/hr at 1800 tonight, providing 151g AA, 70.6g Lipids (29%), 312g Dex (GIR 1.5) and  2373 kCal meeting 100% of estimated needs Electrolytes in TPN (No changes today) - cont Na to 150 mEq/L (MAX), incr K+ 53 mEq/L, Ca 445m/L, Mg 1568mL, Phos 45m11mL, Max Cl Add standard MVI and trace elements to TPN TPN labs in AM F/u better intake for TPN weaning   Lauren [name], Lauren [name] Clinical Staff Pharmacist [email or web address] 08/14/2021 7:43 AM

## 2021-08-14 NOTE — Progress Notes (Signed)
Subjective: CC: No abdominal pain, n/v. Tolerating fld. BM yesterday. Has air and tan output in eakin's. 35cc/24 hours.   Objective: Vital signs in last 24 hours: Temp:  [97.5 F (36.4 C)-97.9 F (36.6 C)] 97.7 F (36.5 C) (09/20 0411) Pulse Rate:  [53-111] 63 (09/20 0820) Resp:  [16-18] 18 (09/20 0411) BP: (99-117)/(44-78) 111/49 (09/20 0820) SpO2:  [96 %-100 %] 98 % (09/20 0411) Weight:  [146 kg] 146 kg (09/20 0500) Last BM Date: 08/11/21  Intake/Output from previous day: 09/19 0701 - 09/20 0700 In: 1306.6 [I.V.:1306.6] Out: 3585 [Urine:3550; Drains:35] Intake/Output this shift: No intake/output data recorded.  PE: Gen:  Alert, NAD, pleasant Pulm:  rate and effort normal Abd: obese, soft, ND, NT, +BS, eakins pouch w/ small amount of tan output as noted below along with air in bag  Lab Results:  Recent Labs    08/12/21 0350  WBC 8.1  HGB 7.4*  HCT 25.3*  PLT 259   BMET Recent Labs    08/13/21 0500 08/14/21 0500  NA 133* 133*  K 4.2 3.9  CL 97* 96*  CO2 26 30  GLUCOSE 101* 75  BUN 23 29*  CREATININE 0.52 0.62  CALCIUM 8.1* 8.0*   PT/INR No results for input(s): LABPROT, INR in the last 72 hours. CMP     Component Value Date/Time   NA 133 (L) 08/14/2021 0500   K 3.9 08/14/2021 0500   CL 96 (L) 08/14/2021 0500   CO2 30 08/14/2021 0500   GLUCOSE 75 08/14/2021 0500   BUN 29 (H) 08/14/2021 0500   CREATININE 0.62 08/14/2021 0500   CALCIUM 8.0 (L) 08/14/2021 0500   PROT 5.9 (L) 08/13/2021 0500   ALBUMIN 1.5 (L) 08/13/2021 0500   AST 23 08/13/2021 0500   ALT 19 08/13/2021 0500   ALKPHOS 137 (H) 08/13/2021 0500   BILITOT 0.4 08/13/2021 0500   GFRNONAA >60 08/14/2021 0500   GFRAA >90 03/28/2014 0435   Lipase     Component Value Date/Time   LIPASE 25 03/27/2014 0600    Studies/Results: No results found.  Anti-infectives: Anti-infectives (From admission, onward)    Start     Dose/Rate Route Frequency Ordered Stop   08/12/21 0415   clindamycin (CLEOCIN) IVPB 600 mg        600 mg 100 mL/hr over 30 Minutes Intravenous  Once 08/12/21 0317 08/12/21 0434   07/20/21 1000  remdesivir 100 mg in sodium chloride 0.9 % 100 mL IVPB       See Hyperspace for full Linked Orders Report.   100 mg 200 mL/hr over 30 Minutes Intravenous Daily 07/19/21 1040 07/21/21 1148   07/20/21 0200  vancomycin (VANCOREADY) IVPB 750 mg/150 mL  Status:  Discontinued        750 mg 150 mL/hr over 60 Minutes Intravenous Every 24 hours 07/19/21 1036 07/19/21 1449   07/19/21 2300  anidulafungin (ERAXIS) 100 mg in sodium chloride 0.9 % 100 mL IVPB  Status:  Discontinued        100 mg 78 mL/hr over 100 Minutes Intravenous Every 24 hours 07/18/21 2107 07/19/21 1449   07/19/21 1600  vancomycin (VANCOREADY) IVPB 750 mg/150 mL  Status:  Discontinued        750 mg 150 mL/hr over 60 Minutes Intravenous Every 24 hours 07/19/21 0919 07/19/21 1016   07/19/21 1600  vancomycin (VANCOREADY) IVPB 750 mg/150 mL  Status:  Discontinued        750 mg 150 mL/hr over  60 Minutes Intravenous Every 24 hours 07/19/21 1016 07/19/21 1036   07/19/21 1400  piperacillin-tazobactam (ZOSYN) IVPB 3.375 g  Status:  Discontinued        3.375 g 12.5 mL/hr over 240 Minutes Intravenous Every 8 hours 07/19/21 1016 07/31/21 1501   07/19/21 1130  remdesivir 200 mg in sodium chloride 0.9% 250 mL IVPB       See Hyperspace for full Linked Orders Report.   200 mg 580 mL/hr over 30 Minutes Intravenous Once 07/19/21 1040 07/19/21 1241   07/19/21 0100  metroNIDAZOLE (FLAGYL) IVPB 500 mg  Status:  Discontinued        500 mg 100 mL/hr over 60 Minutes Intravenous Every 12 hours 07/18/21 1203 07/18/21 1534   07/18/21 2200  ceFEPIme (MAXIPIME) 2 g in sodium chloride 0.9 % 100 mL IVPB  Status:  Discontinued        2 g 200 mL/hr over 30 Minutes Intravenous Every 12 hours 07/18/21 1156 07/18/21 1203   07/18/21 2200  meropenem (MERREM) 2 g in sodium chloride 0.9 % 100 mL IVPB  Status:  Discontinued         2 g 200 mL/hr over 30 Minutes Intravenous Every 12 hours 07/18/21 2104 07/19/21 1011   07/18/21 2200  anidulafungin (ERAXIS) 200 mg in sodium chloride 0.9 % 200 mL IVPB        200 mg 78 mL/hr over 200 Minutes Intravenous  Once 07/18/21 2108 07/19/21 0419   07/18/21 1800  clindamycin (CLEOCIN) IVPB 900 mg  Status:  Discontinued        900 mg 100 mL/hr over 30 Minutes Intravenous Every 8 hours 07/18/21 1534 07/18/21 2103   07/18/21 1600  vancomycin (VANCOREADY) IVPB 2000 mg/400 mL  Status:  Discontinued        2,000 mg 200 mL/hr over 120 Minutes Intravenous Every 24 hours 07/18/21 1544 07/19/21 0919   07/18/21 1545  piperacillin-tazobactam (ZOSYN) IVPB 3.375 g  Status:  Discontinued        3.375 g 12.5 mL/hr over 240 Minutes Intravenous Every 8 hours 07/18/21 1544 07/18/21 2104   07/18/21 1300  cefTRIAXone (ROCEPHIN) 2 g in sodium chloride 0.9 % 100 mL IVPB  Status:  Discontinued        2 g 200 mL/hr over 30 Minutes Intravenous Every 24 hours 07/18/21 1203 07/18/21 1628   07/18/21 1200  ceFEPIme (MAXIPIME) 2 g in sodium chloride 0.9 % 100 mL IVPB  Status:  Discontinued        2 g 200 mL/hr over 30 Minutes Intravenous Every 12 hours 07/18/21 1155 07/18/21 1156   07/18/21 0930  ceFEPIme (MAXIPIME) 2 g in sodium chloride 0.9 % 100 mL IVPB        2 g 200 mL/hr over 30 Minutes Intravenous  Once 07/18/21 0918 07/18/21 1135   07/18/21 0930  metroNIDAZOLE (FLAGYL) IVPB 500 mg  Status:  Discontinued        500 mg 100 mL/hr over 60 Minutes Intravenous  Once 07/18/21 8921 07/18/21 1534        Assessment/Plan 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 Arnoldo Morale at [facility] on 8/12 - path w/ adenocarcinoma. Oncology has seen on 9/5 - Midline wound opened by Dr. Arnoldo Morale at bedside 8/24 which had stool decompressing through CC fistula  - Currently has eakin's pouch. pouch changed 9/15 - pic in media section and note 9/17  - CT 9/17 w/ overall improvement and no sign  of  persistent communication between bowel lumen and wound  - Although imaging reassuring, given air and output still in eakin's pouch I am concerned the CC fistula has not completely closed. WIll discuss with MD if we should make NPO again. For now will hold at full liquid diet and continue TPN     FEN - FLD. TPN. IVF per primary VTE - SCDs, Lovenox, per primary  ID - None currently.    COVID + A. Fib on Xarelto at home  ABL anemia - FOBT positive 8/24 Hypothyroidism Severe protein calorie malnutrition - albumin 1.3. Pre-alb <5. On TNA   LOS: 27 days    Jillyn Ledger , [facility] Surgery 08/14/2021, 8:33 AM Please see Amion for pager number during day hours 7:00am-4:30pm

## 2021-08-14 NOTE — Progress Notes (Addendum)
Physical Therapy Treatment Patient Details Name: Lauren [name] MRN: [id number] DOB: 1946-08-08 Today's Date: 08/14/2021   History of Present Illness 75 yo female admitted 6/55 from SNF [facility]) with hypotension and Covid (+). Pt with colocutaneous fistula, Afib and new CHf since admission. Pt also with abdominal wall cellulitis and left thigh hematoma requiring blood transfusion. Pt with recent admission (8/9-8/23) to [facility] with partially obstructing tumor in the ascending colon s/p R hemicolectomy 07/06/21. PMH: A fib, morbid obesity, hypertension, lymphedema, hypothyroidism    PT Comments    Pt initially resistant to mobility stating need to rest. Pt educated that she has denied mobility for 11 days with PT and has not walked in that time. Very frank conversation with pt about refusal policy, benefit of mobility and that her excuses are only detrimental to her. Pt willing to attempt mobility and able to get to EOB, stand for pericare with incontinent stool and transfer to chair. Pt ultimately is very anxious and concerned about all aspects of her life and needs reinforcement and reassurance to progress. Will continue to follow and progress mobility with pt encouraged to be OOB daily with nursing staff. Co-tx with OT to maximize pt mobility and function.   VSS throughout    Recommendations for follow up therapy are one component of a multi-disciplinary discharge planning process, led by the attending physician.  Recommendations may be updated based on patient status, additional functional criteria and insurance authorization.  Follow Up Recommendations  SNF;Supervision/Assistance - 24 hour     Equipment Recommendations  None recommended by PT    Recommendations for Other Services       Precautions / Restrictions Precautions Precautions: Fall;Other (comment) Precaution Comments: painful LLE, bil LE lymphedema, LUE cellulitis, ostomy, incontinent     Mobility  Bed Mobility Overal bed  mobility: Needs Assistance Bed Mobility: Supine to Sit     Supine to sit: Min assist;+2 for physical assistance;HOB elevated     General bed mobility comments: HOB 40 degrees with increased time with assist of LLE to pivot toward right side of bed with HHA to elevate trunk and fully scoot to EOB. Pt did not follow cues for rolling or use of rail with transition to sitting. MOd assist with reciprocal scooting to slide back in chair    Transfers Overall transfer level: Needs assistance   Transfers: Sit to/from Stand Sit to Stand: Min assist;+2 safety/equipment         General transfer comment: pt able to stand from EOB with min assist Left knee blocked and cues for hand placement. Pt stood grossly 3 min for peri care in static standing due to incontinent BM. Pt then able to step grossly 3' from bed to chair with use of RW and min assist +2 for lines and safety  Ambulation/Gait   Gait Distance (Feet): 3 Feet Assistive device: Rolling walker (2 wheeled) Gait Pattern/deviations: Shuffle;Trunk flexed   Gait velocity interpretation: <1.8 ft/sec, indicate of risk for recurrent falls General Gait Details: very flexed trunk with forearms on RW   Stairs             Wheelchair Mobility    Modified Rankin (Stroke Patients Only)       Balance Overall balance assessment: Needs assistance Sitting-balance support: Feet supported;No upper extremity supported Sitting balance-Leahy Scale: Fair Sitting balance - Comments: able to sit EOB and at edge of chair without UE support   Standing balance support: Bilateral upper extremity supported Standing balance-Leahy Scale: Poor Standing balance  comment: bil UE support in standing                            Cognition Arousal/Alertness: Awake/alert Behavior During Therapy: Flat affect;Anxious Overall Cognitive Status: Impaired/Different from baseline Area of Impairment: Safety/judgement                          Safety/Judgement: Decreased awareness of safety;Decreased awareness of deficits     General Comments: pt initially stating she needs to get her head straight and right with Jesus before she can move. Lots of excuses not to move with direct education on why her body needs movement for her to attempt and engage. Pt fearful and reassured throughout. Pt also with incontinent stool with mobility and unaware      Exercises General Exercises - Lower Extremity Short Arc Quad: AROM;Both;Seated;10 reps    General Comments        Pertinent Vitals/Pain Pain Score: 5  Pain Location: L LE Pain Descriptors / Indicators: Discomfort;Grimacing;Guarding;Moaning Pain Intervention(s): Limited activity within patient's tolerance;Monitored during session;Repositioned    Home Living                      Prior Function            PT Goals (current goals can now be found in the care plan section) Acute Rehab PT Goals Time For Goal Achievement: 08/28/21 Potential to Achieve Goals: Fair Progress towards PT goals: Goals downgraded-see care plan    Frequency    Min 2X/week      PT Plan Current plan remains appropriate    Co-evaluation PT/OT/SLP Co-Evaluation/Treatment: Yes Reason for Co-Treatment: Complexity of the patient's impairments (multi-system involvement);For patient/therapist safety PT goals addressed during session: Mobility/safety with mobility;Proper use of DME;Strengthening/ROM        AM-PAC PT "6 Clicks" Mobility   Outcome Measure  Help needed turning from your back to your side while in a flat bed without using bedrails?: A Lot Help needed moving from lying on your back to sitting on the side of a flat bed without using bedrails?: A Lot Help needed moving to and from a bed to a chair (including a wheelchair)?: A Lot Help needed standing up from a chair using your arms (e.g., wheelchair or bedside chair)?: A Little Help needed to walk in hospital room?: A Lot Help  needed climbing 3-5 steps with a railing? : Total 6 Click Score: 12    End of Session   Activity Tolerance: Patient tolerated treatment well Patient left: in chair;with call bell/phone within reach;with chair alarm set Nurse Communication: Mobility status;Other (comment) (bowel incontinence) PT Visit Diagnosis: Other abnormalities of gait and mobility (R26.89);Muscle weakness (generalized) (M62.81)     Time: 1950-9326 PT Time Calculation (min) (ACUTE ONLY): 38 min  Charges:  $Therapeutic Activity: 23-37 mins                     Amaka Gluth P, PT Acute Rehabilitation Services Pager: [phone] Office: [phone]    Jameriah Trotti B Jedaiah Rathbun 08/14/2021, 10:51 AM

## 2021-08-15 DIAGNOSIS — K632 Fistula of intestine: Secondary | ICD-10-CM | POA: Diagnosis not present

## 2021-08-15 DIAGNOSIS — A419 Sepsis, unspecified organism: Secondary | ICD-10-CM | POA: Diagnosis not present

## 2021-08-15 DIAGNOSIS — N179 Acute kidney failure, unspecified: Secondary | ICD-10-CM | POA: Diagnosis not present

## 2021-08-15 DIAGNOSIS — L03311 Cellulitis of abdominal wall: Secondary | ICD-10-CM | POA: Diagnosis not present

## 2021-08-15 MED ORDER — SODIUM CHLORIDE 0.9 % IV SOLN: INTRAVENOUS | Status: DC

## 2021-08-15 MED ORDER — TRAVASOL 10 % IV SOLN
INTRAVENOUS | Status: AC
  Filled 2021-08-15: qty 1512

## 2021-08-15 NOTE — TOC Progression Note (Signed)
Transition of Care [facility]) - Progression Note    Patient Details  Name: Lauren [name] MRN: [id number] Date of Birth: May 06, 1946  Transition of Care [facility]) CM/SW Contact  Joanne Chars, LCSW Phone Number: 08/15/2021, 8:41 AM  Clinical Narrative:   TOC continues to follow for DC needs.     Expected Discharge Plan: [place] Barriers to Discharge: Continued Medical Work up, SNF Pending bed offer  Expected Discharge Plan and Services Expected Discharge Plan: [place] arrangements for the past 2 months: Single Family Home                                       Social Determinants of Health (SDOH) Interventions    Readmission Risk Interventions Readmission Risk Prevention Plan 07/12/2021  Transportation Screening Complete  Home Care Screening Complete  Medication Review (RN CM) Complete  Some recent data might be hidden

## 2021-08-15 NOTE — Progress Notes (Signed)
PROGRESS NOTE    MORELIA CASSELLS  OQH:[id number] DOB: 07-19-1946 DOA: 07/18/2021 PCP: Pcp, No    Brief Narrative:  Mrs. Devinney was admitted to the hospital with the working diagnosis of colocutaneous fistula with surrounding cellulitis/ abscess, complicated with septic shock.   75 year old female who was brought to the hospital due to hypotension.  Recent hospitalization 07/03/21-07/17/2021 for gastrointestinal bleeding due to cecal mass.  She had right hemicolectomy on 07/06/2021.  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  Patient had nausea, abdominal pain, and right-sided back pain.  On her initial physical examination she was hypotensive, refractive intravenous fluids.  She was placed on vasopressors and broad-spectrum antibiotic therapy.  CT of the abdomen pelvis right hemicolectomy with a suture site in the mid abdomen.  Positive fistulous connection between the bowel adjacent to the suture into the overlying abdominal wall and to the skin surface through a pre-existing umbilical hernia.  The fistula is in communication with a focal collection with layering debris's measuring up to 7.4 cm in the abdominal wall consistent with abscess.  Extensive soft tissue emphysema tracking through the body wall.  No free intraperitoneal air identified.  [facility]mitted to [facility], placed on broad-spectrum antibiotic therapy including meropenem, vancomycin, antifungal therapy. General surgery was consulted. Patient transferred to [facility] for further care.  Vasopressors were weaned off successfully. TPN started on 8/25.  Transferred to [facility] 8/27.   Follow-up CT 9/17 with overall improvement and no signs of persistent communication within the bowel lumen and wound.  Patient slowly progressing.   Assessment & Plan:   Principal Problem:   Septic shock ([place]) Active Problems:   Hypokalemia   Morbid obesity with BMI of 50.0-59.9, adult (HCC)   Colon carcinoma (HCC)    Enterocutaneous fistula   Cellulitis   AKI (acute kidney injury) ([place])   Hyponatremia   Abdominal pain  Post operative colocutaneous fistula with cellulitis and abscess, complicated with sepsis (present on admission). Colon adenocarcinoma.  Patient complains of being thirsty. Currently she is NPO per surgery recommendations.   Continue supportive medical care with TPN for nutrition.  She has completed antibiotic therapy. Will need SNF at discharge.   Follow with oncology as outpatient, no current evidence of metastasis.   2. Left thigh hematoma, with acute blood loss anemia.  Her Hgb has remained stable. She is sp 4 units PRBC transfusion.  Last transfusion on 09.04.  Follow up of H&H, holding on anticoagulation for now.   3. Atrial fibrillation with RVR.  Continue rate controlled with amiodarone and metoprolol.  Holding anticoagulation due to risk of bleeding.   4. Chronic systolic heart failure. LV systolic function decreased to 30%, positive diastolic dysfunction as well.  Clinically no signs of acute exacerbation today.   5. AKI, hyponatremia, hypokalemia, hypomagnesemia, hypochloremia. Stable renal function and electrolytes Patient on TPN. Positive thirst, serum bicarbonate at 30, possible contraction alkalosis. Will add gentle IV fluids for volume repletion. Isotonic saline at 50 ml per hr.   6. Obesity class 3, bilateral lower extremity chronic lymphedema.  Coccyx and thigh pressure ulcer (present on admission) Patient very weak and deconditioned, continue PT and OT, will need SNF at discharge.  Continue local skin care  7. Metabolic encephalopathy with delirium, anxiety and depression,  Encephalopathy resolved.  Continue with citalopram as needed lorazepam. Discontinue PRN haloperidol.   8. Hypothyroid. Continue with levothyroxine   9. COVID 19 infection, no viral pneumonia, now off isolation.    Patient continue  to be at high risk for worsening fistula  formation and abscess   Status is: Inpatient  Remains inpatient appropriate because:Inpatient level of care appropriate due to severity of illness  Dispo: The patient is from: Home              Anticipated d/c is to: SNF              Patient currently is not medically stable to d/c.   Difficult to place patient No  DVT prophylaxis: Enoxaparin   Code Status:   full  Family Communication:  No family at the bedside      Nutrition Status: Nutrition Problem: Inadequate oral intake Etiology: altered GI function, acute illness Signs/Symptoms: NPO status Interventions: TPN     Skin Documentation: Pressure Injury 07/18/21 Coccyx (Active)  07/18/21 2100  Location: Coccyx  Location Orientation:   Staging:   Wound Description (Comments):   Present on Admission:      Pressure Injury 07/18/21 Thigh Posterior;Proximal;Right (Active)  07/18/21 2100  Location: Thigh  Location Orientation: Posterior;Proximal;Right  Staging:   Wound Description (Comments):   Present on Admission:      Consultants:  Surgery    Subjective: Patient continue to be very weak and deconditioned, no dyspnea, no chest pain. Positive thirst.   Objective: Vitals:   08/15/21 0530 08/15/21 0755 08/15/21 1002 08/15/21 1242  BP: (!) 115/54 (!) 107/55 (!) 98/48   Pulse: 83 63 98 66  Resp: 18     Temp: (!) 97.4 F (36.3 C)     TempSrc: Oral     SpO2: 93%     Weight: (!) 143 kg     Height:        Intake/Output Summary (Last 24 hours) at 08/15/2021 1259 Last data filed at 08/15/2021 0524 Gross per 24 hour  Intake 823.38 ml  Output 820 ml  Net 3.38 ml   Filed Weights   08/11/21 0407 08/14/21 0500 08/15/21 0530  Weight: (!) 142.7 kg (!) 146 kg (!) 143 kg    Examination:   General: Not in pain or dyspnea, deconditioned and ill looking appearing  Neurology: Awake and alert, non focal  E ENT: no pallor, no icterus, oral mucosa  dry Cardiovascular: No JVD. S1-S2 present, rhythmic, no gallops,  rubs, or murmurs. No lower extremity edema. Pulmonary: positive breath sounds bilaterally, adequate air movement, no wheezing, rhonchi or rales. Gastrointestinal. Abdomen protuberant, soft and non tender to superficial palpation, ostomy bag in place, no local erythema or edema Skin. No rashes Musculoskeletal: no joint deformities     Data Reviewed: I have personally reviewed following labs and imaging studies  CBC: Recent Labs  Lab 08/09/21 1545 08/12/21 0350  WBC 8.3 8.1  HGB 7.8* 7.4*  HCT 24.8* 25.3*  MCV 102.5* 92.7  PLT 223 903   Basic Metabolic Panel: Recent Labs  Lab 08/09/21 1820 08/10/21 0355 08/11/21 0435 08/12/21 0350 08/13/21 0500 08/14/21 0500  NA 130* 130* 130* 133* 133* 133*  K 5.0 4.6 4.5 4.1 4.2 3.9  CL 100 100 98 99 97* 96*  CO2 26 26 27 28 26 30   GLUCOSE 85 79 89 105* 101* 75  BUN 21 23 18 17 23  29*  CREATININE 0.50 0.49 0.49 0.51 0.52 0.62  CALCIUM 7.8* 7.6* 7.7* 7.6* 8.1* 8.0*  MG 2.0 2.0  --   --  1.9 2.0  PHOS 3.5 3.5  --   --  3.4  --    GFR: Estimated Creatinine Clearance: 87.6  mL/min (by C-G formula based on SCr of 0.62 mg/dL). Liver Function Tests: Recent Labs  Lab 08/09/21 1820 08/13/21 0500  AST 40 23  ALT 28 19  ALKPHOS 195* 137*  BILITOT 0.8 0.4  PROT 6.6 5.9*  ALBUMIN 1.5* 1.5*   No results for input(s): LIPASE, AMYLASE in the last 168 hours. No results for input(s): AMMONIA in the last 168 hours. Coagulation Profile: No results for input(s): INR, PROTIME in the last 168 hours. Cardiac Enzymes: No results for input(s): CKTOTAL, CKMB, CKMBINDEX, TROPONINI in the last 168 hours. BNP (last 3 results) No results for input(s): PROBNP in the last 8760 hours. HbA1C: No results for input(s): HGBA1C in the last 72 hours. CBG: Recent Labs  Lab 08/12/21 0240  GLUCAP 105*   Lipid Profile: Recent Labs    08/13/21 0500  TRIG 62   Thyroid Function Tests: No results for input(s): TSH, T4TOTAL, FREET4, T3FREE, THYROIDAB in  the last 72 hours. Anemia Panel: No results for input(s): VITAMINB12, FOLATE, FERRITIN, TIBC, IRON, RETICCTPCT in the last 72 hours.    Radiology Studies: I have reviewed all of the imaging during this hospital visit personally     Scheduled Meds:  amiodarone  200 mg Oral Daily   Chlorhexidine Gluconate Cloth  6 each Topical Daily   citalopram  20 mg Oral Daily   enoxaparin (LOVENOX) injection  40 mg Subcutaneous Q24H   feeding supplement  237 mL Oral BID BM   levothyroxine  150 mcg Oral Q0600   loratadine  10 mg Oral Daily   mouth rinse  15 mL Mouth Rinse BID   metoprolol tartrate  12.5 mg Oral BID   pantoprazole  40 mg Oral Q0600   sodium chloride flush  10-40 mL Intracatheter Q12H   torsemide  20 mg Oral Daily   Continuous Infusions:  methocarbamol (ROBAXIN) IV 500 mg (07/25/21 2343)   TPN ADULT (ION) 105 mL/hr at 08/15/21 0134   TPN ADULT (ION)       LOS: 28 days        Arlin Savona Gerome Apley, MD

## 2021-08-15 NOTE — Progress Notes (Signed)
PHARMACY - TOTAL PARENTERAL NUTRITION CONSULT NOTE  Indication: Fistula  Patient Measurements: Height: 5' 2"  (157.5 cm) Weight: (!) 143 kg (315 lb 4.1 oz) IBW/kg (Calculated) : 50.1 TPN AdjBW (KG): 66.6 Body mass index is 57.66 kg/m.   Assessment:  66 YOF with recent R-hemicolectomy on 8/12 in the setting of a cecal mass - consistent with R-colon metastatic adenocarcinoma. 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 Pharmacy consulted to manage TPN for nutritional support in the setting of a colocutaneous fistula.    Glucose / Insulin: CBGs controlled on BMETs, SSI d/c'd 8/28 Electrolytes: Na 133 (on max), K 3.9 (goal >/=4 for Afib), Cl 96 low, Mag 2 (goal >/= 2 for Afib),  CoCa 10.1 (low albumin 1.5). No new labs 9/21 Renal:  SCr < 1, BUN up to 29 Hepatic: LFTs / Tbili / TG WNL, alk phos 137 down, albumin 1.5 Intake / Output; MIVF: Drains 20, UOP 1658m . LBM 9/19 x1 - poor appetite with 0 intake noted yesterday, except 1 Ensure GI Meds: po PPI  GI Imaging: 8/24 Abd CT: colocutaneous fistula, 7.4cm abd wall abscess, soft tissue edema, cellulitis vs nec fasciitis 8/25 Abd xray: diffuse abd wall emphysema, nonobstructive bowel gas pattern 9/4 CT abd/pelvis: medial upper left thigh hematoma 9/17: CT abd - No evidence of communication between bowel and subQ tissue. Continued decrease in volume extensive subcutaneous gas along the RIGHT abdominal wall and upper groin.  [facility] / [facility]: [facility] 8/24: Midline wound opened by Dr. JArnoldo Moraleat bedside  Central access: PICC replaced 07/27/21 TPN start date: 07/19/21  Nutritional Goals (RD assessment on 9/16): 2300-2500 kcal, 140-160g AA, fluid >/= 2L per day  Current Nutrition:  TPN Back to NPO 9/20 Ensure BID still ordered  Plan:  No change today Continue TPN at goal rate of 105 mL/hr at 1800 tonight, providing 151g AA, 70.6g Lipids (29%), 312g  Dex (GIR 1.5) and 2373 kCal meeting 100% of estimated needs Electrolytes in TPN (No changes today) - cont Na to 150 mEq/L (MAX), K+ 53 mEq/L, Ca 474m/L, Mg 1550mL, Phos 53m29mL, Max Cl Add standard MVI and trace elements to TPN TPN labs in AM   Antasia Haider S. RobeAlford HighlandarmD, BCPS Clinical Staff Pharmacist [email or web address] 08/15/2021 7:24 AM

## 2021-08-15 NOTE — Progress Notes (Signed)
This chaplain responded to the consult for spiritual care/prayer.  The Pt. is awake and willing to visit with the chaplain.  Through reflective listening the chaplain understands the Pt. is coping with her illness through her faith in God's love and companionship.  The Pt. expresses her appreciation in having the chaplain listen and is hopeful family and a community of faith will continue to be present and listen at time of discharge.   The Pt. accepted the chaplain's invitation for prayer and F/U spiritual care as needed.

## 2021-08-15 NOTE — Progress Notes (Signed)
Subjective: CC: Not very talkative this morning but denies any abdominal pain or emesis.   Objective: Vital signs in last 24 hours: Temp:  [97.1 F (36.2 C)-98.2 F (36.8 C)] 97.4 F (36.3 C) (09/21 0530) Pulse Rate:  [63-96] 63 (09/21 0755) Resp:  [12-20] 18 (09/21 0530) BP: (107-140)/(47-57) 107/55 (09/21 0755) SpO2:  [93 %-97 %] 93 % (09/21 0530) Weight:  [143 kg] 143 kg (09/21 0530) Last BM Date: 08/11/21  Intake/Output from previous day: 09/20 0701 - 09/21 0700 In: 823.4 [I.V.:823.4] Out: 1620 [Urine:1600; Drains:20] Intake/Output this shift: No intake/output data recorded.  PE: Gen:  Alert, NAD, pleasant Pulm:  rate and effort normal Abd: obese, soft, ND, NT, +BS, eakins pouch w/ small amount of tan output and air in bag  Lab Results:  No results for input(s): WBC, HGB, HCT, PLT in the last 72 hours. BMET Recent Labs    08/13/21 0500 08/14/21 0500  NA 133* 133*  K 4.2 3.9  CL 97* 96*  CO2 26 30  GLUCOSE 101* 75  BUN 23 29*  CREATININE 0.52 0.62  CALCIUM 8.1* 8.0*   PT/INR No results for input(s): LABPROT, INR in the last 72 hours. CMP     Component Value Date/Time   NA 133 (L) 08/14/2021 0500   K 3.9 08/14/2021 0500   CL 96 (L) 08/14/2021 0500   CO2 30 08/14/2021 0500   GLUCOSE 75 08/14/2021 0500   BUN 29 (H) 08/14/2021 0500   CREATININE 0.62 08/14/2021 0500   CALCIUM 8.0 (L) 08/14/2021 0500   PROT 5.9 (L) 08/13/2021 0500   ALBUMIN 1.5 (L) 08/13/2021 0500   AST 23 08/13/2021 0500   ALT 19 08/13/2021 0500   ALKPHOS 137 (H) 08/13/2021 0500   BILITOT 0.4 08/13/2021 0500   GFRNONAA >60 08/14/2021 0500   GFRAA >90 03/28/2014 0435   Lipase     Component Value Date/Time   LIPASE 25 03/27/2014 0600    Studies/Results: No results found.  Anti-infectives: Anti-infectives (From admission, onward)    Start     Dose/Rate Route Frequency Ordered Stop   08/12/21 0415  clindamycin (CLEOCIN) IVPB 600 mg        600 mg 100 mL/hr over 30  Minutes Intravenous  Once 08/12/21 0317 08/12/21 0434   07/20/21 1000  remdesivir 100 mg in sodium chloride 0.9 % 100 mL IVPB       See Hyperspace for full Linked Orders Report.   100 mg 200 mL/hr over 30 Minutes Intravenous Daily 07/19/21 1040 07/21/21 1148   07/20/21 0200  vancomycin (VANCOREADY) IVPB 750 mg/150 mL  Status:  Discontinued        750 mg 150 mL/hr over 60 Minutes Intravenous Every 24 hours 07/19/21 1036 07/19/21 1449   07/19/21 2300  anidulafungin (ERAXIS) 100 mg in sodium chloride 0.9 % 100 mL IVPB  Status:  Discontinued        100 mg 78 mL/hr over 100 Minutes Intravenous Every 24 hours 07/18/21 2107 07/19/21 1449   07/19/21 1600  vancomycin (VANCOREADY) IVPB 750 mg/150 mL  Status:  Discontinued        750 mg 150 mL/hr over 60 Minutes Intravenous Every 24 hours 07/19/21 0919 07/19/21 1016   07/19/21 1600  vancomycin (VANCOREADY) IVPB 750 mg/150 mL  Status:  Discontinued        750 mg 150 mL/hr over 60 Minutes Intravenous Every 24 hours 07/19/21 1016 07/19/21 1036   07/19/21 1400  piperacillin-tazobactam (ZOSYN)  IVPB 3.375 g  Status:  Discontinued        3.375 g 12.5 mL/hr over 240 Minutes Intravenous Every 8 hours 07/19/21 1016 07/31/21 1501   07/19/21 1130  remdesivir 200 mg in sodium chloride 0.9% 250 mL IVPB       See Hyperspace for full Linked Orders Report.   200 mg 580 mL/hr over 30 Minutes Intravenous Once 07/19/21 1040 07/19/21 1241   07/19/21 0100  metroNIDAZOLE (FLAGYL) IVPB 500 mg  Status:  Discontinued        500 mg 100 mL/hr over 60 Minutes Intravenous Every 12 hours 07/18/21 1203 07/18/21 1534   07/18/21 2200  ceFEPIme (MAXIPIME) 2 g in sodium chloride 0.9 % 100 mL IVPB  Status:  Discontinued        2 g 200 mL/hr over 30 Minutes Intravenous Every 12 hours 07/18/21 1156 07/18/21 1203   07/18/21 2200  meropenem (MERREM) 2 g in sodium chloride 0.9 % 100 mL IVPB  Status:  Discontinued        2 g 200 mL/hr over 30 Minutes Intravenous Every 12 hours 07/18/21  2104 07/19/21 1011   07/18/21 2200  anidulafungin (ERAXIS) 200 mg in sodium chloride 0.9 % 200 mL IVPB        200 mg 78 mL/hr over 200 Minutes Intravenous  Once 07/18/21 2108 07/19/21 0419   07/18/21 1800  clindamycin (CLEOCIN) IVPB 900 mg  Status:  Discontinued        900 mg 100 mL/hr over 30 Minutes Intravenous Every 8 hours 07/18/21 1534 07/18/21 2103   07/18/21 1600  vancomycin (VANCOREADY) IVPB 2000 mg/400 mL  Status:  Discontinued        2,000 mg 200 mL/hr over 120 Minutes Intravenous Every 24 hours 07/18/21 1544 07/19/21 0919   07/18/21 1545  piperacillin-tazobactam (ZOSYN) IVPB 3.375 g  Status:  Discontinued        3.375 g 12.5 mL/hr over 240 Minutes Intravenous Every 8 hours 07/18/21 1544 07/18/21 2104   07/18/21 1300  cefTRIAXone (ROCEPHIN) 2 g in sodium chloride 0.9 % 100 mL IVPB  Status:  Discontinued        2 g 200 mL/hr over 30 Minutes Intravenous Every 24 hours 07/18/21 1203 07/18/21 1628   07/18/21 1200  ceFEPIme (MAXIPIME) 2 g in sodium chloride 0.9 % 100 mL IVPB  Status:  Discontinued        2 g 200 mL/hr over 30 Minutes Intravenous Every 12 hours 07/18/21 1155 07/18/21 1156   07/18/21 0930  ceFEPIme (MAXIPIME) 2 g in sodium chloride 0.9 % 100 mL IVPB        2 g 200 mL/hr over 30 Minutes Intravenous  Once 07/18/21 0918 07/18/21 1135   07/18/21 0930  metroNIDAZOLE (FLAGYL) IVPB 500 mg  Status:  Discontinued        500 mg 100 mL/hr over 60 Minutes Intravenous  Once 07/18/21 1027 07/18/21 1534        Assessment/Plan 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 Arnoldo Morale at [facility] on 8/12 - path w/ adenocarcinoma. Oncology has seen on 9/5 - Midline wound opened by Dr. Arnoldo Morale at bedside 8/24 which had stool decompressing through CC fistula  - Currently has eakin's pouch. pouch changed 9/15 - pic in media section and note 9/17  - CT 9/17 w/ overall improvement and no sign of persistent communication between bowel lumen and wound  -  Although imaging reassuring, given air and output still  in eakin's pouch I am concerned the CC fistula has not completely closed. Made NPO again on 9/20. Cont TPN     FEN - NPO. TPN. IVF per primary VTE - SCDs, Lovenox, per primary  ID - None currently.    COVID + A. Fib on Xarelto at home  ABL anemia - FOBT positive 8/24 Hypothyroidism Severe protein calorie malnutrition - albumin 1.3. Pre-alb <5. On TNA   LOS: 28 days    Jillyn Ledger , [facility] 08/15/2021, 8:47 AM Please see Amion for pager number during day hours 7:00am-4:30pm

## 2021-08-16 DIAGNOSIS — U071 COVID-19: Secondary | ICD-10-CM | POA: Diagnosis not present

## 2021-08-16 DIAGNOSIS — L03311 Cellulitis of abdominal wall: Secondary | ICD-10-CM | POA: Diagnosis not present

## 2021-08-16 DIAGNOSIS — E876 Hypokalemia: Secondary | ICD-10-CM | POA: Diagnosis not present

## 2021-08-16 DIAGNOSIS — A419 Sepsis, unspecified organism: Secondary | ICD-10-CM | POA: Diagnosis not present

## 2021-08-16 DIAGNOSIS — N179 Acute kidney failure, unspecified: Secondary | ICD-10-CM | POA: Diagnosis not present

## 2021-08-16 DIAGNOSIS — K632 Fistula of intestine: Secondary | ICD-10-CM | POA: Diagnosis not present

## 2021-08-16 LAB — COMPREHENSIVE METABOLIC PANEL
ALT: 17 U/L (ref 0–44)
AST: 21 U/L (ref 15–41)
Albumin: 1.7 g/dL — ABNORMAL LOW (ref 3.5–5.0)
Alkaline Phosphatase: 130 U/L — ABNORMAL HIGH (ref 38–126)
Anion gap: 3 — ABNORMAL LOW (ref 5–15)
BUN: 28 mg/dL — ABNORMAL HIGH (ref 8–23)
CO2: 27 mmol/L (ref 22–32)
Calcium: 7.9 mg/dL — ABNORMAL LOW (ref 8.9–10.3)
Chloride: 106 mmol/L (ref 98–111)
Creatinine, Ser: 0.49 mg/dL (ref 0.44–1.00)
GFR, Estimated: >60 mL/min (ref >60)
Glucose, Bld: 91 mg/dL (ref 70–99)
Potassium: 4.6 mmol/L (ref 3.5–5.1)
Sodium: 136 mmol/L (ref 135–145)
Total Bilirubin: 0.6 mg/dL (ref 0.3–1.2)
Total Protein: 5.7 g/dL — ABNORMAL LOW (ref 6.5–8.1)

## 2021-08-16 LAB — CBC
HCT: 27.7 % — ABNORMAL LOW (ref 36.0–46.0)
Hemoglobin: 8.1 g/dL — ABNORMAL LOW (ref 12.0–15.0)
MCH: 27.4 pg (ref 26.0–34.0)
MCHC: 29.2 g/dL — ABNORMAL LOW (ref 30.0–36.0)
MCV: 93.6 fL (ref 80.0–100.0)
Platelets: 274 10*3/uL (ref 150–400)
RBC: 2.96 MIL/uL — ABNORMAL LOW (ref 3.87–5.11)
RDW: 20.8 % — ABNORMAL HIGH (ref 11.5–15.5)
WBC: 6.1 10*3/uL (ref 4.0–10.5)
nRBC: 0 % (ref 0.0–0.2)

## 2021-08-16 LAB — MAGNESIUM: Magnesium: 2.1 mg/dL (ref 1.7–2.4)

## 2021-08-16 LAB — PHOSPHORUS: Phosphorus: 3.7 mg/dL (ref 2.5–4.6)

## 2021-08-16 MED ORDER — TRAVASOL 10 % IV SOLN
INTRAVENOUS | Status: AC
  Filled 2021-08-16: qty 1512

## 2021-08-16 NOTE — Progress Notes (Signed)
   Palliative Medicine Inpatient Follow Up Note     Chart Reviewed. Patient assessed at the bedside.   Denies pain or shortness of breath. Is eating a few ice chips and shares her appreciation for something cold. States she is having intermittent headaches which she attributes to lack of oral nutrition. Discussed medications for her headaches, states she is receiving medication as needed and feels it is effective.   We discussed at length reasoning of patient remaining NPO with TPN and how this relates to her goals of care. Patient verbalized understanding. She is clear in expressed wishes to continue to treat the treatable as she is remaining hopeful for healing and improvement. She understands at any point her goals change and she feels that she no longer wishes to continue with medical interventions we can re-evaluate and focus on her comfort including comfort feeds. Gabrianna verbalized understanding.   Discussed the importance of continued conversation with her medical providers regarding overall plan of care and treatment options, ensuring decisions are within the context of the patients values and GOCs.  Questions addressed and support provided.    Objective Assessment: Vital Signs Vitals:   08/16/21 0800 08/16/21 0900  BP: (!) 106/56 (!) 100/53  Pulse: 80 100  Resp: 14 15  Temp: 98.1 F (36.7 C) 98 F (36.7 C)  SpO2: 95% 94%    Intake/Output Summary (Last 24 hours) at 08/16/2021 1412 Last data filed at 08/16/2021 8250 Gross per 24 hour  Intake 4472.1 ml  Output 2550 ml  Net 1922.1 ml   Last Weight  Most recent update: 08/16/2021  3:44 AM    Weight  146.7 kg (323 lb 6.6 oz)              Gen:  NAD, ill appearing  HEENT: moist mucous membranes CV: Regular rate and rhythm, no murmurs rubs or gallops PULM: clear to auscultation bilaterally. No wheezes/rales/rhonchi ABD: soft/nontender, ostomy  EXT: bilateral lower extremity edema, obese Neuro: Alert and oriented x3,  mood appropriate   SUMMARY OF RECOMMENDATIONS   Continue with current plan of care per medical team  Lengthy discussions regarding goals of care and current plan of care. Patient verbalized understanding and is clear she will focus on her healing. She wishes to continue to treat the treatable.  PMT will continue to support and follow on as needed basis. Please secure chat for urgent needs.    Time Total: 40 min.   Visit consisted of counseling and education dealing with the complex and emotionally intense issues of symptom management and palliative care in the setting of serious and potentially life-threatening illness.Greater than 50%  of this time was spent counseling and coordinating care related to the above assessment and plan.  Alda Lea, AGPCNP-BC  Palliative Medicine Team [phone]  Palliative Medicine Team providers are available by phone from 7am to 7pm daily and can be reached through the team cell phone. Should this patient require assistance outside of these hours, please call the patient's attending physician.

## 2021-08-16 NOTE — Progress Notes (Signed)
PHARMACY - TOTAL PARENTERAL NUTRITION CONSULT NOTE  Indication: Fistula  Patient Measurements: Height: _0  (157.5 cm) Weight: (!) 146.7 kg (323 lb 6.6 oz) IBW/kg (Calculated) : 50.1 TPN AdjBW (KG): 66.6 Body mass index is 59.15 kg/m.  Assessment:  31 YOF with recent R-hemicolectomy on 8/12 in the setting of a cecal mass - consistent with R-colon metastatic adenocarcinoma. 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 Pharmacy consulted to manage TPN for nutritional support in the setting of a colocutaneous fistula.    Glucose / Insulin: CBGs controlled on BMETs, SSI d/c'd 8/28 Electrolytes: K up to 4.6 (goal >/= 4 for Afib), others WNL Renal:  SCr < 1 stable, BUN 28 Hepatic: LFTs WNL except downtrending alk phos to 130, tbili / TG WNL, albumin 1.7 Intake / Output; MIVF: UOP 0.5 ml/kg/hr, drain 35m, LBM 9/19 GI Imaging:  8/24 CT: colocutaneous fistula, abd wall abscess, soft tissue edema, cellulitis vs nec fasciitis 8/25 Abd xray: diffuse abd wall emphysema, nonobstructive bowel gas pattern 9/4 CT: medial upper left thigh hematoma 9/17 CT: no evidence of communication between bowel and subQ tissue. Decrease volume extensive subcutaneous gas along the RIGHT abd wall and upper groin. GI [facility]geries / [facility]:  [facility] 8/24: Midline wound opened by Dr. JArnoldo Moraleat bedside  Central access: PICC replaced 07/27/21 TPN start date: 07/19/21  Nutritional Goals (RD assessment on 9/16): 2300-2500 kcal, 140-160g AA, fluid >/= 2L per day  Current Nutrition:  TPN Back to NPO 9/20 to allow fistula to close Ensure BID - none charted given on 9/21  Plan:  Continue TPN at goal rate of 105 ml/hr, providing 151g AA 312g CHO and 71g ILE for a total of 2373 kCal, meeting 100% of estimated needs Electrolytes in TPN: Na 150 mEq/L, reduce K slightly to 579m/L, Ca 41m60mL, Mg 68m20m, Phos 20mm55m, max CL Add standard MVI  and trace elements to TPN Standard TPN labs on Mon and Thurs Consider cycling TPN if patient to remain NPO for a extended period of time  Elia Nunley D. Ameena Vesey,Mina MarblermD, BCPS, BCCCPP[place]/2022, 7:43 AM

## 2021-08-16 NOTE — Progress Notes (Signed)
PROGRESS NOTE    Lauren [name]  ZOX:[id number] DOB: 03/19/1946 DOA: 07/18/2021 PCP: Lauren [name]    Brief Narrative:  Lauren [name] was admitted to the hospital with the working diagnosis of colocutaneous fistula with surrounding cellulitis/ abscess, complicated with septic shock.    75 year old female who was brought to the hospital due to hypotension.  Recent hospitalization 07/03/21-07/17/2021 for gastrointestinal bleeding due to cecal mass.  She had right hemicolectomy on 07/06/2021.  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  Patient had nausea, abdominal pain, and right-sided back pain.  On her initial physical examination she was hypotensive, refractive intravenous fluids.  She was placed on vasopressors and broad-spectrum antibiotic therapy.   CT of the abdomen pelvis right hemicolectomy with a suture site in the mid abdomen.  Positive fistulous connection between the bowel adjacent to the suture into the overlying abdominal wall and to the skin surface through a pre-existing umbilical hernia.  The fistula is in communication with a focal collection with layering debris's measuring up to 7.4 cm in the abdominal wall consistent with abscess.  Extensive soft tissue emphysema tracking through the body wall.  [name] free intraperitoneal air identified.   Admitted to [facility] intensive care unit, placed on broad-spectrum antibiotic therapy including meropenem, vancomycin, antifungal therapy. General surgery was consulted. Patient transferred to [facility] for further care.  Vasopressors were weaned off successfully. TPN started on 8/25.   Transferred to [facility] 8/27.    Follow-up CT 9/17 with overall improvement and [name] signs of persistent communication within the bowel lumen and wound.   Patient slowly progressing but continue to have small fistula.    Assessment & Plan:   Principal Problem:   Septic shock ([place]) Active Problems:   Hypokalemia   Morbid obesity with BMI of 50.0-59.9,  adult (HCC)   Colon carcinoma (HCC)   Enterocutaneous fistula   Cellulitis   AKI (acute kidney injury) ([place])   Hyponatremia   Abdominal pain   Post operative colocutaneous fistula with cellulitis and abscess, complicated with sepsis (present on admission). Colon adenocarcinoma. \ Completed antibiotic therapy.  Continue NPO for now, with [name] nausea or vomiting, [name] abdominal pain.  Wbc is 6,1 On TPN for nutrition.   Plan for outpatient oncology follow up, [name] current evidence of metastasis.    2. Left thigh hematoma, with acute blood loss anemia.  sp 4 units PRBC transfusion.   Hgb today is 8,1 and hct at 27,7.     3. Atrial fibrillation with RVR.  Continue with amiodarone and metoprolol.  Continue holding on anticoagulation due to risk of bleeding.    4. Chronic systolic heart failure. LV systolic function decreased to 30%, positive diastolic dysfunction as well.   Patient with dyspnea and wheezing today, will hold on IV fluids for now.  Continue with TPN.    5. AKI, hyponatremia, hypokalemia, hypomagnesemia, hypochloremia.  Continue with TPN, electrolytes with Na 136, K 4,6 and bicarbonate at 27 Renal function with serum cr at 0,49 Will hold on IV fluids for now to prevent volume overload.     6. Obesity class 3, bilateral lower extremity chronic lymphedema.  Coccyx and thigh pressure ulcer (present on admission) Pending transfer to SNF, continue with PT and OT.    7. Metabolic encephalopathy with delirium, anxiety and depression,  Encephalopathy resolved.  On citalopram as needed lorazepam.   8. Hypothyroid. On Levothyroxine    9. COVID 19 infection, [name] viral pneumonia, now off isolation.   Status is: Inpatient  Remains inpatient appropriate because:Inpatient level of care appropriate due to severity of illness  Dispo: The patient is from: Home              Anticipated d/c is to: SNF              Patient currently is not medically stable to d/c.   Difficult to  place patient [name]   DVT prophylaxis:  Enoxaparin   Code Status:    DNR   Family Communication:  [name] family at the bedside      Nutrition Status: Nutrition Problem: Inadequate oral intake Etiology: altered GI function, acute illness Signs/Symptoms: NPO status Interventions: TPN     Skin Documentation: Pressure Injury 07/18/21 Coccyx (Active)  07/18/21 2100  Location: Coccyx  Location Orientation:   Staging:   Wound Description (Comments):   Present on Admission:      Pressure Injury 07/18/21 Thigh Posterior;Proximal;Right (Active)  07/18/21 2100  Location: Thigh  Location Orientation: Posterior;Proximal;Right  Staging:   Wound Description (Comments):   Present on Admission:      Subjective: Patient with [name] nausea or vomiting, [name] chest pain, today with positive dyspnea and wheezing, [name] confusion or agitation.   Objective: Vitals:   08/16/21 0039 08/16/21 0343 08/16/21 0800 08/16/21 0900  BP: (!) 112/58 (!) 114/51 (!) 106/56 (!) 100/53  Pulse: 97  80 100  Resp:   14 15  Temp: 98.5 F (36.9 C) 98.3 F (36.8 C) 98.1 F (36.7 C) 98 F (36.7 C)  TempSrc:  Oral Oral Oral  SpO2:   95% 94%  Weight:  (!) 146.7 kg    Height:        Intake/Output Summary (Last 24 hours) at 08/16/2021 1345 Last data filed at 08/16/2021 5102 Gross per 24 hour  Intake 4472.1 ml  Output 2550 ml  Net 1922.1 ml   Filed Weights   08/14/21 0500 08/15/21 0530 08/16/21 0343  Weight: (!) 146 kg (!) 143 kg (!) 146.7 kg    Examination:   General: Not in pain or dyspnea, deconditioned  Neurology: Awake and alert, non focal  E ENT: [name] pallor, [name] icterus, oral mucosa moist Cardiovascular: [name] JVD. S1-S2 present, rhythmic, [name] gallops, rubs, or murmurs. +++ bilateral non pitting lower extremity edema. Pulmonary:  positive breath sounds bilaterally, with mild expiratory wheezing, but [name] rhonchi or rales. Gastrointestinal. Abdomen protuberant, abdominal wound in place Skin. [name]  rashes Musculoskeletal: [name] joint deformities     Data Reviewed: I have personally reviewed following labs and imaging studies  CBC: Recent Labs  Lab 08/09/21 1545 08/12/21 0350 08/16/21 0512  WBC 8.3 8.1 6.1  HGB 7.8* 7.4* 8.1*  HCT 24.8* 25.3* 27.7*  MCV 102.5* 92.7 93.6  PLT 223 259 585   Basic Metabolic Panel: Recent Labs  Lab 08/09/21 1820 08/10/21 0355 08/11/21 0435 08/12/21 0350 08/13/21 0500 08/14/21 0500 08/16/21 0512  NA 130* 130* 130* 133* 133* 133* 136  K 5.0 4.6 4.5 4.1 4.2 3.9 4.6  CL 100 100 98 99 97* 96* 106  CO2 26 26 27 28 26 30 27   GLUCOSE 85 79 89 105* 101* 75 91  BUN 21 23 18 17 23  29* 28*  CREATININE 0.50 0.49 0.49 0.51 0.52 0.62 0.49  CALCIUM 7.8* 7.6* 7.7* 7.6* 8.1* 8.0* 7.9*  MG 2.0 2.0  --   --  1.9 2.0 2.1  PHOS 3.5 3.5  --   --  3.4  --  3.7   GFR: Estimated Creatinine Clearance:  89 mL/min (by C-G formula based on SCr of 0.49 mg/dL). Liver Function Tests: Recent Labs  Lab 08/09/21 1820 08/13/21 0500 08/16/21 0512  AST 40 23 21  ALT 28 19 17   ALKPHOS 195* 137* 130*  BILITOT 0.8 0.4 0.6  PROT 6.6 5.9* 5.7*  ALBUMIN 1.5* 1.5* 1.7*   [name] results for input(s): LIPASE, AMYLASE in the last 168 hours. [name] results for input(s): AMMONIA in the last 168 hours. Coagulation Profile: [name] results for input(s): INR, PROTIME in the last 168 hours. Cardiac Enzymes: [name] results for input(s): CKTOTAL, CKMB, CKMBINDEX, TROPONINI in the last 168 hours. BNP (last 3 results) [name] results for input(s): PROBNP in the last 8760 hours. HbA1C: [name] results for input(s): HGBA1C in the last 72 hours. CBG: Recent Labs  Lab 08/12/21 0240  GLUCAP 105*   Lipid Profile: [name] results for input(s): CHOL, HDL, LDLCALC, TRIG, CHOLHDL, LDLDIRECT in the last 72 hours. Thyroid Function Tests: [name] results for input(s): TSH, T4TOTAL, FREET4, T3FREE, THYROIDAB in the last 72 hours. Anemia Panel: [name] results for input(s): VITAMINB12, FOLATE, FERRITIN, TIBC, IRON,  RETICCTPCT in the last 72 hours.    Radiology Studies: I have reviewed all of the imaging during this hospital visit personally     Scheduled Meds:  amiodarone  200 mg Oral Daily   Chlorhexidine Gluconate Cloth  6 each Topical Daily   citalopram  20 mg Oral Daily   enoxaparin (LOVENOX) injection  40 mg Subcutaneous Q24H   feeding supplement  237 mL Oral BID BM   levothyroxine  150 mcg Oral Q0600   loratadine  10 mg Oral Daily   mouth rinse  15 mL Mouth Rinse BID   metoprolol tartrate  12.5 mg Oral BID   pantoprazole  40 mg Oral Q0600   sodium chloride flush  10-40 mL Intracatheter Q12H   Continuous Infusions:  sodium chloride 50 mL/hr at 08/16/21 0938   methocarbamol (ROBAXIN) IV 500 mg (07/25/21 2343)   TPN ADULT (ION) 105 mL/hr at 08/15/21 1705   TPN ADULT (ION)       LOS: 29 days        Benjermin Korber Gerome Apley, MD

## 2021-08-16 NOTE — Progress Notes (Addendum)
Subjective: CC: No abdominal pain. Having some nausea. No emesis. Passing flatus. BM this am. Continued small amount of air and output from eakins. Patient states she is thirsty and wants to drink.   We discussed in length indication for being npo.   Objective: Vital signs in last 24 hours: Temp:  [97.7 F (36.5 C)-98.5 F (36.9 C)] 98.3 F (36.8 C) (09/22 0343) Pulse Rate:  [66-98] 97 (09/22 0039) Resp:  [15] 15 (09/21 1500) BP: (98-116)/(44-58) 114/51 (09/22 0343) SpO2:  [94 %] 94 % (09/21 1500) Weight:  [146.7 kg] 146.7 kg (09/22 0343) Last BM Date: 08/15/21  Intake/Output from previous day: 09/21 0701 - 09/22 0700 In: 4472.1 [I.V.:4472.1] Out: 1700 [Urine:1700] Intake/Output this shift: No intake/output data recorded.  PE: Gen:  Alert, NAD, pleasant Pulm:  rate and effort normal Abd: obese, soft, ND, NT, +BS, eakins pouch w/ small amount of tan output and air in bag  Lab Results:  Recent Labs    08/16/21 0512  WBC 6.1  HGB 8.1*  HCT 27.7*  PLT 274   BMET Recent Labs    08/14/21 0500 08/16/21 0512  NA 133* 136  K 3.9 4.6  CL 96* 106  CO2 30 27  GLUCOSE 75 91  BUN 29* 28*  CREATININE 0.62 0.49  CALCIUM 8.0* 7.9*   PT/INR No results for input(s): LABPROT, INR in the last 72 hours. CMP     Component Value Date/Time   NA 136 08/16/2021 0512   K 4.6 08/16/2021 0512   CL 106 08/16/2021 0512   CO2 27 08/16/2021 0512   GLUCOSE 91 08/16/2021 0512   BUN 28 (H) 08/16/2021 0512   CREATININE 0.49 08/16/2021 0512   CALCIUM 7.9 (L) 08/16/2021 0512   PROT 5.7 (L) 08/16/2021 0512   ALBUMIN 1.7 (L) 08/16/2021 0512   AST 21 08/16/2021 0512   ALT 17 08/16/2021 0512   ALKPHOS 130 (H) 08/16/2021 0512   BILITOT 0.6 08/16/2021 0512   GFRNONAA >60 08/16/2021 0512   GFRAA >90 03/28/2014 0435   Lipase     Component Value Date/Time   LIPASE 25 03/27/2014 0600    Studies/Results: No results found.  Anti-infectives: Anti-infectives (From admission,  onward)    Start     Dose/Rate Route Frequency Ordered Stop   08/12/21 0415  clindamycin (CLEOCIN) IVPB 600 mg        600 mg 100 mL/hr over 30 Minutes Intravenous  Once 08/12/21 0317 08/12/21 0434   07/20/21 1000  remdesivir 100 mg in sodium chloride 0.9 % 100 mL IVPB       See Hyperspace for full Linked Orders Report.   100 mg 200 mL/hr over 30 Minutes Intravenous Daily 07/19/21 1040 07/21/21 1148   07/20/21 0200  vancomycin (VANCOREADY) IVPB 750 mg/150 mL  Status:  Discontinued        750 mg 150 mL/hr over 60 Minutes Intravenous Every 24 hours 07/19/21 1036 07/19/21 1449   07/19/21 2300  anidulafungin (ERAXIS) 100 mg in sodium chloride 0.9 % 100 mL IVPB  Status:  Discontinued        100 mg 78 mL/hr over 100 Minutes Intravenous Every 24 hours 07/18/21 2107 07/19/21 1449   07/19/21 1600  vancomycin (VANCOREADY) IVPB 750 mg/150 mL  Status:  Discontinued        750 mg 150 mL/hr over 60 Minutes Intravenous Every 24 hours 07/19/21 0919 07/19/21 1016   07/19/21 1600  vancomycin (VANCOREADY) IVPB 750 mg/150 mL  Status:  Discontinued        750 mg 150 mL/hr over 60 Minutes Intravenous Every 24 hours 07/19/21 1016 07/19/21 1036   07/19/21 1400  piperacillin-tazobactam (ZOSYN) IVPB 3.375 g  Status:  Discontinued        3.375 g 12.5 mL/hr over 240 Minutes Intravenous Every 8 hours 07/19/21 1016 07/31/21 1501   07/19/21 1130  remdesivir 200 mg in sodium chloride 0.9% 250 mL IVPB       See Hyperspace for full Linked Orders Report.   200 mg 580 mL/hr over 30 Minutes Intravenous Once 07/19/21 1040 07/19/21 1241   07/19/21 0100  metroNIDAZOLE (FLAGYL) IVPB 500 mg  Status:  Discontinued        500 mg 100 mL/hr over 60 Minutes Intravenous Every 12 hours 07/18/21 1203 07/18/21 1534   07/18/21 2200  ceFEPIme (MAXIPIME) 2 g in sodium chloride 0.9 % 100 mL IVPB  Status:  Discontinued        2 g 200 mL/hr over 30 Minutes Intravenous Every 12 hours 07/18/21 1156 07/18/21 1203   07/18/21 2200  meropenem  (MERREM) 2 g in sodium chloride 0.9 % 100 mL IVPB  Status:  Discontinued        2 g 200 mL/hr over 30 Minutes Intravenous Every 12 hours 07/18/21 2104 07/19/21 1011   07/18/21 2200  anidulafungin (ERAXIS) 200 mg in sodium chloride 0.9 % 200 mL IVPB        200 mg 78 mL/hr over 200 Minutes Intravenous  Once 07/18/21 2108 07/19/21 0419   07/18/21 1800  clindamycin (CLEOCIN) IVPB 900 mg  Status:  Discontinued        900 mg 100 mL/hr over 30 Minutes Intravenous Every 8 hours 07/18/21 1534 07/18/21 2103   07/18/21 1600  vancomycin (VANCOREADY) IVPB 2000 mg/400 mL  Status:  Discontinued        2,000 mg 200 mL/hr over 120 Minutes Intravenous Every 24 hours 07/18/21 1544 07/19/21 0919   07/18/21 1545  piperacillin-tazobactam (ZOSYN) IVPB 3.375 g  Status:  Discontinued        3.375 g 12.5 mL/hr over 240 Minutes Intravenous Every 8 hours 07/18/21 1544 07/18/21 2104   07/18/21 1300  cefTRIAXone (ROCEPHIN) 2 g in sodium chloride 0.9 % 100 mL IVPB  Status:  Discontinued        2 g 200 mL/hr over 30 Minutes Intravenous Every 24 hours 07/18/21 1203 07/18/21 1628   07/18/21 1200  ceFEPIme (MAXIPIME) 2 g in sodium chloride 0.9 % 100 mL IVPB  Status:  Discontinued        2 g 200 mL/hr over 30 Minutes Intravenous Every 12 hours 07/18/21 1155 07/18/21 1156   07/18/21 0930  ceFEPIme (MAXIPIME) 2 g in sodium chloride 0.9 % 100 mL IVPB        2 g 200 mL/hr over 30 Minutes Intravenous  Once 07/18/21 0918 07/18/21 1135   07/18/21 0930  metroNIDAZOLE (FLAGYL) IVPB 500 mg  Status:  Discontinued        500 mg 100 mL/hr over 60 Minutes Intravenous  Once 07/18/21 3154 07/18/21 1534        Assessment/Plan 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 Arnoldo Morale at [place] on 8/12 - path w/ adenocarcinoma. Oncology has seen on 9/5 - Midline wound opened by Dr. Arnoldo Morale at bedside 8/24 which had stool decompressing through CC fistula  - Currently has eakin's pouch. pouch changed 9/15 -  pic in media  section and note 9/17  - CT 9/17 w/ overall improvement and no sign of persistent communication between bowel lumen and wound  - Although imaging reassuring, given air and output still in eakin's pouch I am concerned the CC fistula has not completely closed. Made NPO again on 9/20. Cont TPN - I had a lengthy discussion with the patient about reasoning for being NPO and process of trying to treat her Colocutaneous fistula conservatively. She is not sure if she wants to continue being npo to treat this conservatively as she wants to drink. I discussed with my attending and think it would be reasonable to have palliative see her to help establish goc. They have previously seen her. I discussed with them over the phone and am available to aid in any discussions.    FEN - NPO. TPN. IVF per primary VTE - SCDs, Lovenox, per primary  ID - None currently.    COVID + A. Fib on Xarelto at home  ABL anemia - FOBT positive 8/24 Hypothyroidism Severe protein calorie malnutrition - albumin 1.3. Pre-alb <5. On TNA   LOS: 29 days    Jillyn Ledger , [facility] 08/16/2021, 8:18 AM Please see Amion for pager number during day hours 7:00am-4:30pm

## 2021-08-16 NOTE — Progress Notes (Addendum)
Nutrition Follow-up  DOCUMENTATION CODES:   Not applicable  INTERVENTION:   -TPN management per pharmacy -D/c Ensure Enlive due to NPO status   NUTRITION DIAGNOSIS:   Inadequate oral intake related to altered GI function, acute illness as evidenced by NPO status.  Ongoing  GOAL:   Patient will meet greater than or equal to 90% of their needs  Met with TPN  MONITOR:   Labs, Weight trends, Skin, I & O's, Other (Comment) (TPN)  REASON FOR ASSESSMENT:   Consult Assessment of nutrition requirement/status, New TPN/TNA  ASSESSMENT:   75 year old female who presented to the ED on 8/24 s/p CPR. Pt with recent admission from 8/09 to 8/23 with anemia from GI bleed secondary to cecal mass s/p R hemicolectomy with primary anastomosis on 07/06/21. 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 Pt also found to be positive for COVID-19 on admission. PMH of atrial fibrillation, CHF, HTN, lymphedema, mitral regurgitation. Pt admitted with septic shock with abdominal wall fluid collection and concern for necrotizing fasciitis.  9/11 diet advanced to clear liquids 9/13 diet advanced to full liquids 9/17- CT completed- improved- no sings of persistent communication between bowel lumen and wound 9/21- downgraded to NPO  Reviewed I/O's: +2.8 L x 24 hours and +3.9 L since 08/02/21  UOP: 1.7 L x 24 hours   Eakin's pouch last changed on 08/09/21 and remains with output. Pt NPO since 08/15/21 due to concern that fistula has not completely closed.   Pt remains on TPN for nutritional support. Currently receiving at 105 ml/hr, which provides 2373 kcals and 151 grams protein, which meets 100% of estimated kcal and protein needs.   Palliative care following and pt desires aggressive care.   Medications reviewed and include celexa.   Labs reviewed.   NUTRITION - FOCUSED PHYSICAL EXAM:  Flowsheet Row Most Recent Value  Orbital Region No depletion  Upper Arm Region No depletion   Thoracic and Lumbar Region No depletion  Buccal Region No depletion  Temple Region No depletion  Clavicle Bone Region No depletion  Clavicle and Acromion Bone Region No depletion  Scapular Bone Region No depletion  Dorsal Hand No depletion  Patellar Region No depletion  Anterior Thigh Region No depletion  Posterior Calf Region No depletion  Edema (RD Assessment) Mild  Hair Reviewed  Eyes Reviewed  Mouth Reviewed  Skin Reviewed  Nails Reviewed       Diet Order:   Diet Order             Diet NPO time specified Except for: Ice Chips  Diet effective now                   EDUCATION NEEDS:   Not appropriate for education at this time  Skin:  Skin Assessment: Skin Integrity Issues: Skin Integrity Issues:: Other (Comment) Other: pressure injury to coccyx, pressure injury to R thigh, Eakins pouch to abdomen, MASD to bilateral buttocks  Last BM:  08/15/21  Height:   Ht Readings from Last 1 Encounters:  07/18/21 5' 2"  (1.575 m)    Weight:   Wt Readings from Last 1 Encounters:  08/16/21 (!) 146.7 kg    Ideal Body Weight:  50 kg  BMI:  Body mass index is 59.15 kg/m.  Estimated Nutritional Needs:   Kcal:  6433-2951  Protein:  140-160 grams  Fluid:  >/= 2.0 L    Loistine Chance, RD, LDN, [place] Registered Dietitian II Certified Diabetes Care and Education Specialist Please refer to  AMION for RD and/or RD on-call/weekend/after hours pager

## 2021-08-17 DIAGNOSIS — C189 Malignant neoplasm of colon, unspecified: Secondary | ICD-10-CM | POA: Diagnosis not present

## 2021-08-17 DIAGNOSIS — K632 Fistula of intestine: Secondary | ICD-10-CM | POA: Diagnosis not present

## 2021-08-17 DIAGNOSIS — A419 Sepsis, unspecified organism: Secondary | ICD-10-CM | POA: Diagnosis not present

## 2021-08-17 DIAGNOSIS — N179 Acute kidney failure, unspecified: Secondary | ICD-10-CM | POA: Diagnosis not present

## 2021-08-17 MED ORDER — TRAVASOL 10 % IV SOLN
INTRAVENOUS | Status: AC
  Filled 2021-08-17: qty 1512

## 2021-08-17 NOTE — Progress Notes (Signed)
PT Cancellation Note  Patient Details Name: Lauren [name] MRN: [id number] DOB: 1946/05/25   Cancelled Treatment:    Reason Eval/Treat Not Completed: Patient declined, no reason specified (pt reports too fatigued from medicine (received Ativan and Dilaudid at 0520))   Shariya Gaster B Wojciech Willetts 08/17/2021, 8:18 AM Bayard Males, PT Acute Rehabilitation Services Pager: [phone] Office: [phone]

## 2021-08-17 NOTE — Progress Notes (Signed)
Subjective: CC: No acute changes overnight. Continues to have tan thin output from eakin's pouch.   Objective: Vital signs in last 24 hours: Temp:  [97.4 F (36.3 C)-98.2 F (36.8 C)] 98.1 F (36.7 C) (09/23 0347) Pulse Rate:  [69-100] 90 (09/23 0347) Resp:  [15-21] 20 (09/23 0347) BP: (99-112)/(53-65) 111/60 (09/23 0347) SpO2:  [94 %-98 %] 94 % (09/23 0347) Last BM Date: 08/16/21  Intake/Output from previous day: 09/22 0701 - 09/23 0700 In: 2794.3 [I.V.:2794.3] Out: 1530 [Urine:1500; Drains:30] Intake/Output this shift: No intake/output data recorded.  PE: Gen:  Alert, NAD, pleasant Pulm:  rate and effort normal Abd: obese, soft, ND, mild tenderness near eakin's pouch - otherwise nt. No peritonitis, +BS, eakins pouch w/ small amount of tan output and air in bag  Lab Results:  Recent Labs    08/16/21 0512  WBC 6.1  HGB 8.1*  HCT 27.7*  PLT 274   BMET Recent Labs    08/16/21 0512  NA 136  K 4.6  CL 106  CO2 27  GLUCOSE 91  BUN 28*  CREATININE 0.49  CALCIUM 7.9*   PT/INR No results for input(s): LABPROT, INR in the last 72 hours. CMP     Component Value Date/Time   NA 136 08/16/2021 0512   K 4.6 08/16/2021 0512   CL 106 08/16/2021 0512   CO2 27 08/16/2021 0512   GLUCOSE 91 08/16/2021 0512   BUN 28 (H) 08/16/2021 0512   CREATININE 0.49 08/16/2021 0512   CALCIUM 7.9 (L) 08/16/2021 0512   PROT 5.7 (L) 08/16/2021 0512   ALBUMIN 1.7 (L) 08/16/2021 0512   AST 21 08/16/2021 0512   ALT 17 08/16/2021 0512   ALKPHOS 130 (H) 08/16/2021 0512   BILITOT 0.6 08/16/2021 0512   GFRNONAA >60 08/16/2021 0512   GFRAA >90 03/28/2014 0435   Lipase     Component Value Date/Time   LIPASE 25 03/27/2014 0600    Studies/Results: No results found.  Anti-infectives: Anti-infectives (From admission, onward)    Start     Dose/Rate Route Frequency Ordered Stop   08/12/21 0415  clindamycin (CLEOCIN) IVPB 600 mg        600 mg 100 mL/hr over 30 Minutes  Intravenous  Once 08/12/21 0317 08/12/21 0434   07/20/21 1000  remdesivir 100 mg in sodium chloride 0.9 % 100 mL IVPB       See Hyperspace for full Linked Orders Report.   100 mg 200 mL/hr over 30 Minutes Intravenous Daily 07/19/21 1040 07/21/21 1148   07/20/21 0200  vancomycin (VANCOREADY) IVPB 750 mg/150 mL  Status:  Discontinued        750 mg 150 mL/hr over 60 Minutes Intravenous Every 24 hours 07/19/21 1036 07/19/21 1449   07/19/21 2300  anidulafungin (ERAXIS) 100 mg in sodium chloride 0.9 % 100 mL IVPB  Status:  Discontinued        100 mg 78 mL/hr over 100 Minutes Intravenous Every 24 hours 07/18/21 2107 07/19/21 1449   07/19/21 1600  vancomycin (VANCOREADY) IVPB 750 mg/150 mL  Status:  Discontinued        750 mg 150 mL/hr over 60 Minutes Intravenous Every 24 hours 07/19/21 0919 07/19/21 1016   07/19/21 1600  vancomycin (VANCOREADY) IVPB 750 mg/150 mL  Status:  Discontinued        750 mg 150 mL/hr over 60 Minutes Intravenous Every 24 hours 07/19/21 1016 07/19/21 1036   07/19/21 1400  piperacillin-tazobactam (ZOSYN) IVPB 3.375  g  Status:  Discontinued        3.375 g 12.5 mL/hr over 240 Minutes Intravenous Every 8 hours 07/19/21 1016 07/31/21 1501   07/19/21 1130  remdesivir 200 mg in sodium chloride 0.9% 250 mL IVPB       See Hyperspace for full Linked Orders Report.   200 mg 580 mL/hr over 30 Minutes Intravenous Once 07/19/21 1040 07/19/21 1241   07/19/21 0100  metroNIDAZOLE (FLAGYL) IVPB 500 mg  Status:  Discontinued        500 mg 100 mL/hr over 60 Minutes Intravenous Every 12 hours 07/18/21 1203 07/18/21 1534   07/18/21 2200  ceFEPIme (MAXIPIME) 2 g in sodium chloride 0.9 % 100 mL IVPB  Status:  Discontinued        2 g 200 mL/hr over 30 Minutes Intravenous Every 12 hours 07/18/21 1156 07/18/21 1203   07/18/21 2200  meropenem (MERREM) 2 g in sodium chloride 0.9 % 100 mL IVPB  Status:  Discontinued        2 g 200 mL/hr over 30 Minutes Intravenous Every 12 hours 07/18/21 2104  07/19/21 1011   07/18/21 2200  anidulafungin (ERAXIS) 200 mg in sodium chloride 0.9 % 200 mL IVPB        200 mg 78 mL/hr over 200 Minutes Intravenous  Once 07/18/21 2108 07/19/21 0419   07/18/21 1800  clindamycin (CLEOCIN) IVPB 900 mg  Status:  Discontinued        900 mg 100 mL/hr over 30 Minutes Intravenous Every 8 hours 07/18/21 1534 07/18/21 2103   07/18/21 1600  vancomycin (VANCOREADY) IVPB 2000 mg/400 mL  Status:  Discontinued        2,000 mg 200 mL/hr over 120 Minutes Intravenous Every 24 hours 07/18/21 1544 07/19/21 0919   07/18/21 1545  piperacillin-tazobactam (ZOSYN) IVPB 3.375 g  Status:  Discontinued        3.375 g 12.5 mL/hr over 240 Minutes Intravenous Every 8 hours 07/18/21 1544 07/18/21 2104   07/18/21 1300  cefTRIAXone (ROCEPHIN) 2 g in sodium chloride 0.9 % 100 mL IVPB  Status:  Discontinued        2 g 200 mL/hr over 30 Minutes Intravenous Every 24 hours 07/18/21 1203 07/18/21 1628   07/18/21 1200  ceFEPIme (MAXIPIME) 2 g in sodium chloride 0.9 % 100 mL IVPB  Status:  Discontinued        2 g 200 mL/hr over 30 Minutes Intravenous Every 12 hours 07/18/21 1155 07/18/21 1156   07/18/21 0930  ceFEPIme (MAXIPIME) 2 g in sodium chloride 0.9 % 100 mL IVPB        2 g 200 mL/hr over 30 Minutes Intravenous  Once 07/18/21 0918 07/18/21 1135   07/18/21 0930  metroNIDAZOLE (FLAGYL) IVPB 500 mg  Status:  Discontinued        500 mg 100 mL/hr over 60 Minutes Intravenous  Once 07/18/21 4481 07/18/21 1534        Assessment/Plan 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 Arnoldo Morale at [place] on 8/12 - path w/ adenocarcinoma. Oncology has seen on 9/5 - Midline wound opened by Dr. Arnoldo Morale at bedside 8/24 which had stool decompressing through CC fistula  - Currently has eakin's pouch. pouch changed 9/15 - pic in media section and note 9/17  - CT 9/17 w/ overall improvement and no sign of persistent communication between bowel lumen and wound  - Although  imaging reassuring, given air and output still in eakin's  pouch I am concerned the CC fistula has not completely closed. Made NPO again on 9/20. Cont TPN - Appreciate palliative assistance with GOC   FEN - NPO. TPN. IVF per primary VTE - SCDs, Lovenox, per primary  ID - None currently.    COVID + A. Fib on Xarelto at home  ABL anemia - FOBT positive 8/24 Hypothyroidism Severe protein calorie malnutrition - albumin 1.3. Pre-alb <5. On TNA   LOS: 30 days    Jillyn Ledger , [facility] 08/17/2021, 8:06 AM Please see Amion for pager number during day hours 7:00am-4:30pm

## 2021-08-17 NOTE — TOC Progression Note (Signed)
Transition of Care [facility]) - Progression Note    Patient Details  Name: Lauren [name] MRN: [id number] Date of Birth: [birth year]  Transition of [facility]) CM/SW Contact  Joanne Chars, LCSW Phone Number: 08/17/2021, 9:00 AM  Clinical Narrative:   TOC continues to follow for DC needs.     Expected Discharge Plan: [place] Barriers to Discharge: Continued Medical Work up, SNF Pending bed offer  Expected Discharge Plan and Services Expected Discharge Plan: [place] arrangements for the past 2 months: Single Family Home                                       Social Determinants of Health (SDOH) Interventions    Readmission Risk Interventions Readmission Risk Prevention Plan 07/12/2021  Transportation Screening Complete  Home Care Screening Complete  Medication Review (RN CM) Complete  Some recent data might be hidden

## 2021-08-17 NOTE — Progress Notes (Signed)
PHARMACY - TOTAL PARENTERAL NUTRITION CONSULT NOTE  Indication: Fistula  Patient Measurements: Height: _0  (157.5 cm) Weight: (!) 146.7 kg (323 lb 6.6 oz) IBW/kg (Calculated) : 50.1 TPN AdjBW (KG): 66.6 Body mass index is 59.15 kg/m.  Assessment:  25 YOF with recent R-hemicolectomy on 8/12 in the setting of a cecal mass - consistent with R-colon metastatic adenocarcinoma. 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 Pharmacy consulted to manage TPN for nutritional support in the setting of a colocutaneous fistula.    Glucose / Insulin: CBGs controlled on BMETs, SSI d/c'd 8/28 Electrolytes: 9/22  labs - K up to 4.6 (goal >/= 4 for Afib), others WNL Renal:  SCr < 1 stable, BUN 28 Hepatic: LFTs WNL except downtrending alk phos to 130, tbili / TG WNL, albumin 1.7 Intake / Output; MIVF: UOP 0.4 ml/kg/hr, drain 86m, LBM 9/19 GI Imaging:  8/24 CT: colocutaneous fistula, abd wall abscess, soft tissue edema, cellulitis vs nec fasciitis 8/25 Abd xray: diffuse abd wall emphysema, nonobstructive bowel gas pattern 9/4 CT: medial upper left thigh hematoma 9/17 CT: no evidence of communication between bowel and subQ tissue. Decrease volume extensive subcutaneous gas along the RIGHT abd wall and upper groin. GI Surgeries / [facility]  [facility]: Midline wound opened by Dr. JArnoldo Moraleat bedside  Central access: PICC replaced 07/27/21 TPN start date: 07/19/21  Nutritional Goals (RD assessment on 9/22): 2300-2500 kcal, 140-160g AA, fluid >/= 2L per day  Current Nutrition:  TPN Back to NPO 9/20 to allow fistula to close  Plan:  Continue TPN at goal rate of 105 ml/hr, providing 151g AA 312g CHO and 71g ILE for a total of 2373 kCal, meeting 100% of estimated needs Electrolytes in TPN: Na 150 mEq/L, reduce K slightly to 539m/L on 9/22, Ca 7m8mL, Mg 54m60m, Phos 20mm58m, max CL Add standard MVI and trace elements  to TPN Standard TPN labs on Mon and Thurs.  BMET in AM. Consider cycling TPN if patient to remain NPO for a extended period of time  Kerryn Tennant D. Mansour Balboa,Mina MarblermD, BCPS, BCCCPL[place]/2022, 8:44 AM

## 2021-08-17 NOTE — Progress Notes (Signed)
PROGRESS NOTE    Lauren [name]  FXT:[id number] DOB: 16-Nov-1946 DOA: 07/18/2021 PCP: Pcp, No    Brief Narrative:  Lauren [name] was admitted to the hospital with the working diagnosis of colocutaneous fistula with surrounding cellulitis/ abscess, complicated with septic shock.    75 year old female who was brought to the hospital due to hypotension.  Recent hospitalization 07/03/21-07/17/2021 for gastrointestinal bleeding due to cecal mass.  She had right hemicolectomy on 07/06/2021.  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  Patient had nausea, abdominal pain, and right-sided back pain.  On her initial physical examination she was hypotensive, refractive intravenous fluids.  She was placed on vasopressors and broad-spectrum antibiotic therapy.   CT of the abdomen pelvis right hemicolectomy with a suture site in the mid abdomen.  Positive fistulous connection between the bowel adjacent to the suture into the overlying abdominal wall and to the skin surface through a pre-existing umbilical hernia.  The fistula is in communication with a focal collection with layering debris's measuring up to 7.4 cm in the abdominal wall consistent with abscess.  Extensive soft tissue emphysema tracking through the body wall.  No free intraperitoneal air identified.   Admitted to [facility], placed on broad-spectrum antibiotic therapy including meropenem, vancomycin, antifungal therapy. General surgery was consulted. Patient transferred to [facility] for further care.  Vasopressors were weaned off successfully. TPN started on 8/25.   Transferred to [facility] 8/27.    Follow-up CT 9/17 with overall improvement and no signs of persistent communication within the bowel lumen and wound.   Patient slowly progressing but continue to have small fistula.    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 Colon adenocarcinoma. \ Completed antibiotic therapy.  Fistula not yet closed, continue patient NPO. She has been frustrated because not able to have po water. Continue nutritional support per TPP   Oncology follow up as outpatient, no current evidence of metastasis.    2. Left thigh hematoma, with acute blood loss anemia.  sp 4 units PRBC transfusion.    Cell count has been stable.      3. Atrial fibrillation with RVR.  On amiodarone and metoprolol for rate control. Holding on anticoagulation due to risk of bleeding.  Ok to discontinue telemetry.    4. Chronic systolic heart failure. LV systolic function decreased to 30%, positive diastolic dysfunction as well.    No signs of volume overload, will continue blood pressure monitoring.  Continue with TPN nutrition.    5. AKI, hyponatremia, hypokalemia, hypomagnesemia, hypochloremia.  Follow up renal function in am, patient has been npo and getting nutrition per TPN.    6. Obesity class 3, bilateral lower extremity chronic lymphedema. Depression. Coccyx and thigh pressure ulcer (present on admission) Transfer to SNF, when patient can have po intake.  Continue with PT and OT.  Continue with citalopram.    7. Metabolic encephalopathy with delirium, anxiety and depression,  Encephalopathy resolved.  Continue with citalopram as needed lorazepam.   8. Hypothyroid. Continue with Levothyroxine    9. COVID 19 infection, no viral pneumonia, now off isolation   Status is: Inpatient  Remains inpatient appropriate because:Inpatient level of care appropriate due to severity of illness  Dispo: The patient is from: Home              Anticipated d/c is to: SNF               Patient currently is not medically stable to d/c.   Difficult to place patient No   DVT prophylaxis: Enoxaparin   Code Status:    full  Family Communication:   No family at the bedside      Nutrition Status: Nutrition Problem: Inadequate oral intake Etiology: altered GI function, acute illness Signs/Symptoms: NPO status Interventions: TPN     Skin Documentation: Pressure Injury 07/18/21 Coccyx (Active)  07/18/21 2100  Location: Coccyx  Location Orientation:   Staging:   Wound Description (Comments):   Present on Admission:      Pressure Injury 07/18/21 Thigh Posterior;Proximal;Right (Active)  07/18/21 2100  Location: Thigh  Location Orientation: Posterior;Proximal;Right  Staging:   Wound Description (Comments):   Present on Admission:      Consultants:  Surgery    Antimicrobials:  Zosyn     Subjective: Patient with no nausea or vomiting, no chest pain or dyspnea. She would like to have po water. Very poor mobility, weak and deconditioned   Objective: Vitals:   08/17/21 0012 08/17/21 0347 08/17/21 0950 08/17/21 1201  BP: 112/65 111/60 106/62 103/62  Pulse: 81 90 84   Resp: 20 20    Temp: 98.2 F (36.8 C) 98.1 F (36.7 C)    TempSrc: Oral Oral    SpO2: 95% 94%    Weight:      Height:        Intake/Output Summary (Last 24 hours) at 08/17/2021 1517 Last data filed at 08/17/2021 1201 Gross per 24 hour  Intake 1558.37 ml  Output 1380 ml  Net 178.37 ml   Filed Weights   08/14/21 0500 08/15/21 0530 08/16/21 0343  Weight: (!) 146 kg (!) 143 kg (!) 146.7 kg    Examination:   General: Not in pain or dyspnea. Deconditioned  Neurology: Awake and alert, non focal  E ENT: no pallor, no icterus, oral mucosa moist Cardiovascular: No JVD. S1-S2 present, rhythmic, no gallops, rubs, or murmurs. No lower extremity edema. Pulmonary: Positive breath sounds bilaterally, adequate air movement, no wheezing, rhonchi or rales. Gastrointestinal. Abdomen soft and non  tender, ostomy in place, surgical wound with dressing in place Skin. No rashes Musculoskeletal: no joint deformities     Data Reviewed: I have personally reviewed following labs and imaging studies  CBC: Recent Labs  Lab 08/12/21 0350 08/16/21 0512  WBC 8.1 6.1  HGB 7.4* 8.1*  HCT 25.3* 27.7*  MCV 92.7 93.6  PLT 259 630   Basic Metabolic Panel: Recent Labs  Lab 08/11/21 0435 08/12/21 0350 08/13/21 0500 08/14/21 0500 08/16/21 0512  NA 130* 133* 133* 133* 136  K 4.5 4.1 4.2 3.9 4.6  CL 98 99 97* 96* 106  CO2 27 28 26 30 27   GLUCOSE 89 105* 101* 75 91  BUN 18 17 23  29* 28*  CREATININE 0.49 0.51 0.52 0.62 0.49  CALCIUM 7.7* 7.6* 8.1* 8.0* 7.9*  MG  --   --  1.9 2.0 2.1  PHOS  --   --  3.4  --  3.7   GFR: Estimated Creatinine Clearance: 89 mL/min (by C-G formula based on SCr of 0.49 mg/dL). Liver Function Tests: Recent Labs  Lab 08/13/21 0500 08/16/21 1601  AST 23 21  ALT 19 17  ALKPHOS 137* 130*  BILITOT 0.4 0.6  PROT 5.9* 5.7*  ALBUMIN 1.5* 1.7*   No results for input(s): LIPASE, AMYLASE in the last 168 hours. No results for input(s): AMMONIA in the last 168 hours. Coagulation Profile: No results for input(s): INR, PROTIME in the last 168 hours. Cardiac Enzymes: No results for input(s): CKTOTAL, CKMB, CKMBINDEX, TROPONINI in the last 168 hours. BNP (last 3 results) No results for input(s): PROBNP in the last 8760 hours. HbA1C: No results for input(s): HGBA1C in the last 72 hours. CBG: Recent Labs  Lab 08/12/21 0240  GLUCAP 105*   Lipid Profile: No results for input(s): CHOL, HDL, LDLCALC, TRIG, CHOLHDL, LDLDIRECT in the last 72 hours. Thyroid Function Tests: No results for input(s): TSH, T4TOTAL, FREET4, T3FREE, THYROIDAB in the last 72 hours. Anemia Panel: No results for input(s): VITAMINB12, FOLATE, FERRITIN, TIBC, IRON, RETICCTPCT in the last 72 hours.    Radiology Studies: I have reviewed all of the imaging during this hospital visit  personally     Scheduled Meds:  amiodarone  200 mg Oral Daily   Chlorhexidine Gluconate Cloth  6 each Topical Daily   citalopram  20 mg Oral Daily   enoxaparin (LOVENOX) injection  40 mg Subcutaneous Q24H   levothyroxine  150 mcg Oral Q0600   loratadine  10 mg Oral Daily   mouth rinse  15 mL Mouth Rinse BID   metoprolol tartrate  12.5 mg Oral BID   pantoprazole  40 mg Oral Q0600   sodium chloride flush  10-40 mL Intracatheter Q12H   Continuous Infusions:  TPN ADULT (ION) 105 mL/hr at 08/16/21 1733   TPN ADULT (ION)       LOS: 30 days        Annissa Andreoni Gerome Apley, MD

## 2021-08-18 DIAGNOSIS — C189 Malignant neoplasm of colon, unspecified: Secondary | ICD-10-CM | POA: Diagnosis not present

## 2021-08-18 DIAGNOSIS — L03311 Cellulitis of abdominal wall: Secondary | ICD-10-CM | POA: Diagnosis not present

## 2021-08-18 DIAGNOSIS — A419 Sepsis, unspecified organism: Secondary | ICD-10-CM | POA: Diagnosis not present

## 2021-08-18 DIAGNOSIS — N179 Acute kidney failure, unspecified: Secondary | ICD-10-CM | POA: Diagnosis not present

## 2021-08-18 LAB — BASIC METABOLIC PANEL
Anion gap: 2 — ABNORMAL LOW (ref 5–15)
BUN: 23 mg/dL (ref 8–23)
CO2: 25 mmol/L (ref 22–32)
Calcium: 8.2 mg/dL — ABNORMAL LOW (ref 8.9–10.3)
Chloride: 110 mmol/L (ref 98–111)
Creatinine, Ser: 0.45 mg/dL (ref 0.44–1.00)
GFR, Estimated: >60 mL/min (ref >60)
Glucose, Bld: 101 mg/dL — ABNORMAL HIGH (ref 70–99)
Potassium: 4.6 mmol/L (ref 3.5–5.1)
Sodium: 137 mmol/L (ref 135–145)

## 2021-08-18 MED ORDER — ACETAMINOPHEN 325 MG PO TABS
650.0000 mg | ORAL_TABLET | Freq: Four times a day (QID) | ORAL | Status: DC | PRN
  Administered 2021-08-18 – 2021-09-04 (×8): 650 mg via ORAL
  Filled 2021-08-18 (×9): qty 2

## 2021-08-18 MED ORDER — OXYCODONE HCL 5 MG PO TABS
5.0000 mg | ORAL_TABLET | ORAL | Status: DC | PRN
Start: 2021-08-18 — End: 2021-08-30
  Administered 2021-08-20 – 2021-08-30 (×16): 5 mg via ORAL
  Filled 2021-08-18 (×17): qty 1

## 2021-08-18 MED ORDER — TRAVASOL 10 % IV SOLN
INTRAVENOUS | Status: AC
  Filled 2021-08-18: qty 1512

## 2021-08-18 MED ORDER — ALTEPLASE 2 MG IJ SOLR
2.0000 mg | Freq: Once | INTRAMUSCULAR | Status: AC
  Administered 2021-08-18: 2 mg
  Filled 2021-08-18: qty 2

## 2021-08-18 MED ORDER — HYDROMORPHONE HCL 1 MG/ML IJ SOLN
0.5000 mg | INTRAMUSCULAR | Status: DC | PRN
  Administered 2021-08-20 – 2021-08-28 (×11): 0.5 mg via INTRAVENOUS
  Filled 2021-08-18 (×11): qty 1

## 2021-08-18 NOTE — Progress Notes (Signed)
PHARMACY - TOTAL PARENTERAL NUTRITION CONSULT NOTE  Indication: Fistula  Patient Measurements: Height: _0  (157.5 cm) Weight: (!) 151 kg (332 lb 14.3 oz) IBW/kg (Calculated) : 50.1 TPN AdjBW (KG): 66.6 Body mass index is 60.89 kg/m.  Assessment:  73 YOF with recent R-hemicolectomy on 8/12 in the setting of a cecal mass - consistent with R-colon metastatic adenocarcinoma. 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 Pharmacy consulted to manage TPN for nutritional support in the setting of a colocutaneous fistula.    Glucose / Insulin: CBGs controlled on BMETs, SSI d/c'd 8/28 Electrolytes: 9/22  labs - K up to 4.6 (goal >/= 4 for Afib), others WNL Renal:  SCr < 1 stable, BUN 28 Hepatic: LFTs WNL except downtrending alk phos to 130, tbili / TG WNL, albumin 1.7 Intake / Output; MIVF: UOP 0.6 ml/kg/hr, drain 43m, LBM 9/23 GI Imaging:  8/24 CT: colocutaneous fistula, abd wall abscess, soft tissue edema, cellulitis vs nec fasciitis 8/25 Abd xray: diffuse abd wall emphysema, nonobstructive bowel gas pattern 9/4 CT: medial upper left thigh hematoma 9/17 CT: no evidence of communication between bowel and subQ tissue. Decrease volume extensive subcutaneous gas along the RIGHT abd wall and upper groin. GI [facility] / [facility]  [facility]: Midline wound opened by Dr. JArnoldo Moraleat bedside  Central access: PICC replaced 07/27/21 TPN start date: 07/19/21  Nutritional Goals (RD assessment on 9/22): 2300-2500 kcal, 140-160g AA, fluid >/= 2L per day  Current Nutrition:  TPN Back to NPO 9/20 to allow fistula to close  Plan:  Delay in lab collection and TPN order must be entered by 12N  Continue TPN at goal rate of 105 ml/hr, providing 151g AA 312g CHO and 71g ILE for a total of 2373 kCal, meeting 100% of estimated needs Electrolytes in TPN: Na 150 mEq/L, reduce K slightly to 566m/L on 9/22, Ca 67m48mL, Mg  43m467m, Phos 20mm82m, max CL - no change today 9/24 Add standard MVI and trace elements to TPN Standard TPN labs on Mon and Thurs Consider cycling TPN if patient to remain NPO for a extended period of time  Gauri Galvao D. Alyssha Housh,Mina MarblermD, BCPS, BCCCPEl Segundo/2022, 11:52 AM

## 2021-08-18 NOTE — Progress Notes (Signed)
PROGRESS NOTE    Lauren [name]  ENI:[id number] DOB: 1946/02/17 DOA: 07/18/2021 PCP: Pcp, No    Brief Narrative:  Lauren [name] was admitted to the hospital with the working diagnosis of colocutaneous fistula with surrounding cellulitis/ abscess, complicated with septic shock.    75 year old female who was brought to the hospital due to hypotension.  Recent hospitalization 07/03/21-07/17/2021 for gastrointestinal bleeding due to cecal mass.  She had right hemicolectomy on 07/06/2021.  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  Patient had nausea, abdominal pain, and right-sided back pain.  On her initial physical examination she was hypotensive, refractive intravenous fluids.  She was placed on vasopressors and broad-spectrum antibiotic therapy.   CT of the abdomen pelvis right hemicolectomy with a suture site in the mid abdomen.  Positive fistulous connection between the bowel adjacent to the suture into the overlying abdominal wall and to the skin surface through a pre-existing umbilical hernia.  The fistula is in communication with a focal collection with layering debris's measuring up to 7.4 cm in the abdominal wall consistent with abscess.  Extensive soft tissue emphysema tracking through the body wall.  No free intraperitoneal air identified.   Admitted to [facility], placed on broad-spectrum antibiotic therapy including meropenem, vancomycin, antifungal therapy. General surgery was consulted. Patient transferred to [facility] for [facility] care.  Vasopressors were weaned off successfully. TPN started on 8/25.   Transferred to [facility] 8/27.    Follow-up CT 9/17 with overall improvement and no signs of persistent communication within the bowel lumen and wound.   Patient slowly progressing but continue to have fistula drainage Patient remains NPO.    Assessment & Plan:   Principal Problem:   Septic shock ([place]) Active Problems:   Hypokalemia   Morbid obesity  with BMI of 50.0-59.9, adult (HCC)   Colon carcinoma (HCC)   Enterocutaneous fistula   Cellulitis   AKI (acute kidney injury) ([place])   Hyponatremia   Abdominal pain     Post operative colocutaneous fistula with cellulitis and abscess, complicated with sepsis (present on admission). New diagnosis of colon adenocarcinoma. Completed antibiotic therapy.  Continue to have fistula drainage, not documented output.   Oncology follow up as outpatient, no current evidence of metastasis.   Continue supportive medical care with TPN and analgesics Completed antibiotic therapy.  NPO for now per surgery recommendations.    2. Left thigh hematoma, with acute blood loss anemia.  sp 4 units PRBC transfusion.   Last hgb checked on 09.22 was 8,1 with htc at 27,7    3. Atrial fibrillation with RVR.  Continue rate control with amiodarone and metoprolol. Enoxaparin prophylactic dosing for DVT due to recent bleed.   Now off telemetry.    4. Chronic systolic heart failure. LV systolic function decreased to 30%, positive diastolic dysfunction as well.    No clinical exacerbation today. Continue blood pressure monitoring.    5. AKI, hyponatremia, hypokalemia, hypomagnesemia, hypochloremia.  Pending blood work for today.  Continue TPN per pharmacy protocol.  6. Obesity class 3, bilateral lower extremity chronic lymphedema. Depression. Coccyx and thigh pressure ulcer (present on admission)  PT and OT.  Continue with citalopram.   Wound care   7. Metabolic encephalopathy with delirium, anxiety and depression,  Encephalopathy resolved.  On citalopram as needed lorazepam. Patient getting hydromorphone for pain, will add oxycodone for moderate pain.    8. Hypothyroid. On levothyroxine    9. COVID 19 infection, no viral pneumonia, now off isolation  Status is: Inpatient  Remains inpatient appropriate because:Inpatient level of care appropriate due to severity of illness  Dispo: The  patient is from: Home              Anticipated d/c is to: SNF              Patient currently is not medically stable to d/c.   Difficult to place patient No   DVT prophylaxis: Enoxaparin   Code Status:    Dnr   Family Communication:   No family at the bedside      Nutrition Status: Nutrition Problem: Inadequate oral intake Etiology: altered GI function, acute illness Signs/Symptoms: NPO status Interventions: TPN     Skin Documentation: Pressure Injury 07/18/21 Coccyx (Active)  07/18/21 2100  Location: Coccyx  Location Orientation:   Staging:   Wound Description (Comments):   Present on Admission:      Pressure Injury 07/18/21 Thigh Posterior;Proximal;Right (Active)  07/18/21 2100  Location: Thigh  Location Orientation: Posterior;Proximal;Right  Staging:   Wound Description (Comments):   Present on Admission:      Consultants:  Surgery    Subjective: Patient with positive headache, continue to be frustrated because wants to have water po.  No nausea or vomiting.  No abdominal pain, continue to be very weak and deconditioned.   Objective: Vitals:   08/17/21 2000 08/17/21 2032 08/17/21 2334 08/18/21 0410  BP: 110/70 (!) 149/89 (!) 148/80 120/75  Pulse: 79 83 82 86  Resp:      Temp: 97.6 F (36.4 C)  97.9 F (36.6 C) 97.6 F (36.4 C)  TempSrc: Oral  Oral Oral  SpO2:      Weight:    (!) 151 kg  Height:        Intake/Output Summary (Last 24 hours) at 08/18/2021 1006 Last data filed at 08/18/2021 0835 Gross per 24 hour  Intake 2446.69 ml  Output 2502 ml  Net -55.31 ml   Filed Weights   08/15/21 0530 08/16/21 0343 08/18/21 0410  Weight: (!) 143 kg (!) 146.7 kg (!) 151 kg    Examination:   General: Not in pain or dyspnea. Deconditioned  Neurology: Awake and alert, non focal  E ENT: positive pallor, no icterus, oral mucosa moist Cardiovascular: No JVD. S1-S2 present, rhythmic, no gallops, rubs, or murmurs. +++ non pitting bilateral lower extremity  edema. Pulmonary: positive breath sounds bilaterally, adequate air movement, no wheezing, rhonchi or rales. Gastrointestinal. Abdomen protuberant and non tender Fistula present with clear liquid drainage.  Skin. No rashes Musculoskeletal: no joint deformities     Data Reviewed: I have personally reviewed following labs and imaging studies  CBC: Recent Labs  Lab 08/12/21 0350 08/16/21 0512  WBC 8.1 6.1  HGB 7.4* 8.1*  HCT 25.3* 27.7*  MCV 92.7 93.6  PLT 259 478   Basic Metabolic Panel: Recent Labs  Lab 08/12/21 0350 08/13/21 0500 08/14/21 0500 08/16/21 0512  NA 133* 133* 133* 136  K 4.1 4.2 3.9 4.6  CL 99 97* 96* 106  CO2 28 26 30 27   GLUCOSE 105* 101* 75 91  BUN 17 23 29* 28*  CREATININE 0.51 0.52 0.62 0.49  CALCIUM 7.6* 8.1* 8.0* 7.9*  MG  --  1.9 2.0 2.1  PHOS  --  3.4  --  3.7   GFR: Estimated Creatinine Clearance: 90.8 mL/min (by C-G formula based on SCr of 0.49 mg/dL). Liver Function Tests: Recent Labs  Lab 08/13/21 0500 08/16/21 0512  AST 23 21  ALT 19 17  ALKPHOS 137* 130*  BILITOT 0.4 0.6  PROT 5.9* 5.7*  ALBUMIN 1.5* 1.7*   No results for input(s): LIPASE, AMYLASE in the last 168 hours. No results for input(s): AMMONIA in the last 168 hours. Coagulation Profile: No results for input(s): INR, PROTIME in the last 168 hours. Cardiac Enzymes: No results for input(s): CKTOTAL, CKMB, CKMBINDEX, TROPONINI in the last 168 hours. BNP (last 3 results) No results for input(s): PROBNP in the last 8760 hours. HbA1C: No results for input(s): HGBA1C in the last 72 hours. CBG: Recent Labs  Lab 08/12/21 0240  GLUCAP 105*   Lipid Profile: No results for input(s): CHOL, HDL, LDLCALC, TRIG, CHOLHDL, LDLDIRECT in the last 72 hours. Thyroid Function Tests: No results for input(s): TSH, T4TOTAL, FREET4, T3FREE, THYROIDAB in the last 72 hours. Anemia Panel: No results for input(s): VITAMINB12, FOLATE, FERRITIN, TIBC, IRON, RETICCTPCT in the last 72  hours.    Radiology Studies: I have reviewed all of the imaging during this hospital visit personally     Scheduled Meds:  amiodarone  200 mg Oral Daily   Chlorhexidine Gluconate Cloth  6 each Topical Daily   citalopram  20 mg Oral Daily   enoxaparin (LOVENOX) injection  40 mg Subcutaneous Q24H   levothyroxine  150 mcg Oral Q0600   loratadine  10 mg Oral Daily   mouth rinse  15 mL Mouth Rinse BID   metoprolol tartrate  12.5 mg Oral BID   pantoprazole  40 mg Oral Q0600   sodium chloride flush  10-40 mL Intracatheter Q12H   Continuous Infusions:  TPN ADULT (ION) 105 mL/hr at 08/17/21 1737     LOS: 31 days        Joseline Mccampbell Gerome Apley, MD

## 2021-08-18 NOTE — Progress Notes (Signed)
Subjective: CC: No acute changes overnight. Continues to have tan thin output from eakin's pouch. No abdominal pain. BM yesterday.   Objective: Vital signs in last 24 hours: Temp:  [97.6 F (36.4 C)-97.9 F (36.6 C)] 97.6 F (36.4 C) (09/24 0410) Pulse Rate:  [79-86] 86 (09/24 0410) BP: (103-149)/(62-89) 120/75 (09/24 0410) Weight:  [151 kg] 151 kg (09/24 0410) Last BM Date: 08/17/21  Intake/Output from previous day: 09/23 0701 - 09/24 0700 In: 2446.7 [I.V.:2446.7] Out: 2102 [Urine:2101; Stool:1] Intake/Output this shift: No intake/output data recorded.  PE: Gen:  Alert, NAD, pleasant Pulm:  rate and effort normal Abd: obese, soft, ND, nt. No peritonitis, +BS, eakins pouch w/ tan output and air in bag  Lab Results:  Recent Labs    08/16/21 0512  WBC 6.1  HGB 8.1*  HCT 27.7*  PLT 274   BMET Recent Labs    08/16/21 0512  NA 136  K 4.6  CL 106  CO2 27  GLUCOSE 91  BUN 28*  CREATININE 0.49  CALCIUM 7.9*   PT/INR No results for input(s): LABPROT, INR in the last 72 hours. CMP     Component Value Date/Time   NA 136 08/16/2021 0512   K 4.6 08/16/2021 0512   CL 106 08/16/2021 0512   CO2 27 08/16/2021 0512   GLUCOSE 91 08/16/2021 0512   BUN 28 (H) 08/16/2021 0512   CREATININE 0.49 08/16/2021 0512   CALCIUM 7.9 (L) 08/16/2021 0512   PROT 5.7 (L) 08/16/2021 0512   ALBUMIN 1.7 (L) 08/16/2021 0512   AST 21 08/16/2021 0512   ALT 17 08/16/2021 0512   ALKPHOS 130 (H) 08/16/2021 0512   BILITOT 0.6 08/16/2021 0512   GFRNONAA >60 08/16/2021 0512   GFRAA >90 03/28/2014 0435   Lipase     Component Value Date/Time   LIPASE 25 03/27/2014 0600    Studies/Results: No results found.  Anti-infectives: Anti-infectives (From admission, onward)    Start     Dose/Rate Route Frequency Ordered Stop   08/12/21 0415  clindamycin (CLEOCIN) IVPB 600 mg        600 mg 100 mL/hr over 30 Minutes Intravenous  Once 08/12/21 0317 08/12/21 0434   07/20/21 1000   remdesivir 100 mg in sodium chloride 0.9 % 100 mL IVPB       See Hyperspace for full Linked Orders Report.   100 mg 200 mL/hr over 30 Minutes Intravenous Daily 07/19/21 1040 07/21/21 1148   07/20/21 0200  vancomycin (VANCOREADY) IVPB 750 mg/150 mL  Status:  Discontinued        750 mg 150 mL/hr over 60 Minutes Intravenous Every 24 hours 07/19/21 1036 07/19/21 1449   07/19/21 2300  anidulafungin (ERAXIS) 100 mg in sodium chloride 0.9 % 100 mL IVPB  Status:  Discontinued        100 mg 78 mL/hr over 100 Minutes Intravenous Every 24 hours 07/18/21 2107 07/19/21 1449   07/19/21 1600  vancomycin (VANCOREADY) IVPB 750 mg/150 mL  Status:  Discontinued        750 mg 150 mL/hr over 60 Minutes Intravenous Every 24 hours 07/19/21 0919 07/19/21 1016   07/19/21 1600  vancomycin (VANCOREADY) IVPB 750 mg/150 mL  Status:  Discontinued        750 mg 150 mL/hr over 60 Minutes Intravenous Every 24 hours 07/19/21 1016 07/19/21 1036   07/19/21 1400  piperacillin-tazobactam (ZOSYN) IVPB 3.375 g  Status:  Discontinued  3.375 g 12.5 mL/hr over 240 Minutes Intravenous Every 8 hours 07/19/21 1016 07/31/21 1501   07/19/21 1130  remdesivir 200 mg in sodium chloride 0.9% 250 mL IVPB       See Hyperspace for full Linked Orders Report.   200 mg 580 mL/hr over 30 Minutes Intravenous Once 07/19/21 1040 07/19/21 1241   07/19/21 0100  metroNIDAZOLE (FLAGYL) IVPB 500 mg  Status:  Discontinued        500 mg 100 mL/hr over 60 Minutes Intravenous Every 12 hours 07/18/21 1203 07/18/21 1534   07/18/21 2200  ceFEPIme (MAXIPIME) 2 g in sodium chloride 0.9 % 100 mL IVPB  Status:  Discontinued        2 g 200 mL/hr over 30 Minutes Intravenous Every 12 hours 07/18/21 1156 07/18/21 1203   07/18/21 2200  meropenem (MERREM) 2 g in sodium chloride 0.9 % 100 mL IVPB  Status:  Discontinued        2 g 200 mL/hr over 30 Minutes Intravenous Every 12 hours 07/18/21 2104 07/19/21 1011   07/18/21 2200  anidulafungin (ERAXIS) 200 mg in  sodium chloride 0.9 % 200 mL IVPB        200 mg 78 mL/hr over 200 Minutes Intravenous  Once 07/18/21 2108 07/19/21 0419   07/18/21 1800  clindamycin (CLEOCIN) IVPB 900 mg  Status:  Discontinued        900 mg 100 mL/hr over 30 Minutes Intravenous Every 8 hours 07/18/21 1534 07/18/21 2103   07/18/21 1600  vancomycin (VANCOREADY) IVPB 2000 mg/400 mL  Status:  Discontinued        2,000 mg 200 mL/hr over 120 Minutes Intravenous Every 24 hours 07/18/21 1544 07/19/21 0919   07/18/21 1545  piperacillin-tazobactam (ZOSYN) IVPB 3.375 g  Status:  Discontinued        3.375 g 12.5 mL/hr over 240 Minutes Intravenous Every 8 hours 07/18/21 1544 07/18/21 2104   07/18/21 1300  cefTRIAXone (ROCEPHIN) 2 g in sodium chloride 0.9 % 100 mL IVPB  Status:  Discontinued        2 g 200 mL/hr over 30 Minutes Intravenous Every 24 hours 07/18/21 1203 07/18/21 1628   07/18/21 1200  ceFEPIme (MAXIPIME) 2 g in sodium chloride 0.9 % 100 mL IVPB  Status:  Discontinued        2 g 200 mL/hr over 30 Minutes Intravenous Every 12 hours 07/18/21 1155 07/18/21 1156   07/18/21 0930  ceFEPIme (MAXIPIME) 2 g in sodium chloride 0.9 % 100 mL IVPB        2 g 200 mL/hr over 30 Minutes Intravenous  Once 07/18/21 0918 07/18/21 1135   07/18/21 0930  metroNIDAZOLE (FLAGYL) IVPB 500 mg  Status:  Discontinued        500 mg 100 mL/hr over 60 Minutes Intravenous  Once 07/18/21 1950 07/18/21 1534        Assessment/Plan 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 Arnoldo Morale at [facility] on 8/12 - path w/ adenocarcinoma. Oncology has seen on 9/5 - Midline wound opened by Dr. Arnoldo Morale at bedside 8/24 which had stool decompressing through CC fistula  - Currently has eakin's pouch. pouch changed 9/15 - pic in media section and note 9/17  - CT 9/17 w/ overall improvement and no sign of persistent communication between bowel lumen and wound  - Although imaging reassuring, given air and output still in eakin's pouch I  am concerned the CC fistula has not completely closed. Made  NPO again on 9/20. Cont TPN - Appreciate palliative assistance with GOC - Will see again on Monday    FEN - NPO. TPN. IVF per primary VTE - SCDs, Lovenox, per primary  ID - None currently.    COVID + A. Fib on Xarelto at home  ABL anemia - FOBT positive 8/24 Hypothyroidism Severe protein calorie malnutrition - albumin 1.3. Pre-alb <5. On TNA   LOS: 31 days    Jillyn Ledger , [facility] Surgery 08/18/2021, 7:58 AM Please see Amion for pager number during day hours 7:00am-4:30pm

## 2021-08-19 ENCOUNTER — Inpatient Hospital Stay (HOSPITAL_COMMUNITY): Payer: Medicare HMO

## 2021-08-19 DIAGNOSIS — L03311 Cellulitis of abdominal wall: Secondary | ICD-10-CM | POA: Diagnosis not present

## 2021-08-19 DIAGNOSIS — C189 Malignant neoplasm of colon, unspecified: Secondary | ICD-10-CM | POA: Diagnosis not present

## 2021-08-19 DIAGNOSIS — A419 Sepsis, unspecified organism: Secondary | ICD-10-CM | POA: Diagnosis not present

## 2021-08-19 DIAGNOSIS — N179 Acute kidney failure, unspecified: Secondary | ICD-10-CM | POA: Diagnosis not present

## 2021-08-19 LAB — CREATININE, SERUM
Creatinine, Ser: 0.4 mg/dL — ABNORMAL LOW (ref 0.44–1.00)
GFR, Estimated: >60 mL/min (ref >60)

## 2021-08-19 MED ORDER — TRAVASOL 10 % IV SOLN
INTRAVENOUS | Status: AC
  Filled 2021-08-19: qty 1512

## 2021-08-19 NOTE — Progress Notes (Signed)
RN having difficulty with PICC line drawing back blood for labs. Unable to get creatinine lab at this time. IV team recommending R arm & chest XR to verify placement of PICC line. MD put in orders for XR.

## 2021-08-19 NOTE — Progress Notes (Signed)
PROGRESS NOTE    Lauren [name]  HWE:[id number] DOB: 1946/03/25 DOA: 07/18/2021 PCP: Pcp, No    Brief Narrative:  Lauren [name] was admitted to the hospital with the working diagnosis of colocutaneous fistula with surrounding cellulitis/ abscess, complicated with septic shock.    75 year old female who was brought to the hospital due to hypotension.  Recent hospitalization 07/03/21-07/17/2021 for gastrointestinal bleeding due to cecal mass.  She had right hemicolectomy on 07/06/2021.  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  Patient had nausea, abdominal pain, and right-sided back pain.  On her initial physical examination she was hypotensive, refractive intravenous fluids.  She was placed on vasopressors and broad-spectrum antibiotic therapy.   CT of the abdomen pelvis right hemicolectomy with a suture site in the mid abdomen.  Positive fistulous connection between the bowel adjacent to the suture into the overlying abdominal wall and to the skin surface through a pre-existing umbilical hernia.  The fistula is in communication with a focal collection with layering debris's measuring up to 7.4 cm in the abdominal wall consistent with abscess. Extensive soft tissue emphysema tracking through the body wall.  No free intraperitoneal air identified.   Admitted to [facility] intensive care unit, placed on broad-spectrum antibiotic therapy including meropenem, vancomycin, antifungal therapy. General surgery was consulted. Patient transferred to [facility] for further care.  Vasopressors were weaned off successfully. TPN started on 8/25.   Transferred to [facility] 8/27.    Follow-up CT 9/17 with overall improvement and no signs of persistent communication within the bowel lumen and wound.   Patient slowly progressing but continue to have fistula drainage Patient remains NPO.   On TPN for nutrition, has peripheral edema. She has been very frustrated because can not eat.   Assessment &  Plan:   Principal Problem:   Septic shock ([place]) Active Problems:   Hypokalemia   Morbid obesity with BMI of 50.0-59.9, adult (HCC)   Colon carcinoma (HCC)   Enterocutaneous fistula   Cellulitis   AKI (acute kidney injury) ([place])   Hyponatremia   Abdominal pain   Post operative colocutaneous fistula with cellulitis and abscess, complicated with sepsis (present on admission). New diagnosis of colon adenocarcinoma. Completed antibiotic therapy.  Persistent fistula drainage, documented 40 cc    Oncology follow up as outpatient, no current evidence of metastasis.    TPN for nutrition. Follow with surgery recommendations. Patient is very weak and deconditioned     2. Left thigh hematoma, with acute blood loss anemia.  sp 4 units PRBC transfusion.   Follow Hgb as needed.     3. Atrial fibrillation with RVR.  Rate control with amiodarone and metoprolol. Enoxaparin at prophylactic  due to recent bleed.    Now off telemetry.    4. Chronic systolic heart failure. LV systolic function decreased to 30%, positive diastolic dysfunction as well.    No  signs of acute clinical exacerbation today.   5. AKI, hyponatremia, hypokalemia, hypomagnesemia, hypochloremia.  Electrolytes continue to be stable, along with renal function with serum cr at 0,45, Na 137 and K at 4,6. Continue TPN per pharmacy protocol.    6. Obesity class 3, bilateral lower extremity chronic lymphedema. Depression. Coccyx and thigh pressure ulcer (resolved per nursing documentation 09.25) Continue with PT and OT.  On citalopram.   Continue with skin care   7. Metabolic encephalopathy with delirium, anxiety and depression,  Encephalopathy resolved.  Continue with citalopram as needed lorazepam. Pain control with as needed hydromorphone and oxycodone  8. Hypothyroid. Continue with levothyroxine    9. COVID 19 infection, no viral pneumonia, now off isolation   Status is: Inpatient  Remains inpatient  appropriate because:Inpatient level of care appropriate due to severity of illness  Dispo: The patient is from: Home              Anticipated d/c is to: SNF              Patient currently is not medically stable to d/c.   Difficult to place patient No   DVT prophylaxis: Enoxaparin   Code Status:    full  Family Communication:  No family at the bedside      Nutrition Status: Nutrition Problem: Inadequate oral intake Etiology: altered GI function, acute illness Signs/Symptoms: NPO status Interventions: TPN    Consultants:  Surgery    Subjective: Patient continue to be very weak and deconditioned, today at the time of my visit she is somnolent but easy to arouse, follows commands and answers to simple questions.   Objective: Vitals:   08/18/21 1000 08/18/21 1203 08/18/21 2137 08/19/21 0404  BP: 123/65 (!) 119/59 (!) 124/99 109/72  Pulse: 85 82 96 88  Resp:  18 16 18   Temp:  97.7 F (36.5 C) 98.4 F (36.9 C) (!) 97.4 F (36.3 C)  TempSrc:  Oral Axillary Oral  SpO2: 97% 95% 98% 96%  Weight:      Height:        Intake/Output Summary (Last 24 hours) at 08/19/2021 1332 Last data filed at 08/18/2021 2100 Gross per 24 hour  Intake 1526.75 ml  Output 840 ml  Net 686.75 ml   Filed Weights   08/15/21 0530 08/16/21 0343 08/18/21 0410  Weight: (!) 143 kg (!) 146.7 kg (!) 151 kg    Examination:   General: Not in pain or dyspnea., deconditioned  Neurology: somnolent but easy to arouse.  E ENT: no pallor, no icterus, oral mucosa moist Cardiovascular: No JVD. S1-S2 present, rhythmic, no gallops, rubs, or murmurs. +++ pitting bilateral upper and lower extremity edema. Pulmonary: vesicular breath sounds bilaterally, adequate air movement, no wheezing, rhonchi or rales. Gastrointestinal. Abdomen is protuberant, non tender, positive open abdominal wound with ostomy bag in pace.  Skin. No rashes Musculoskeletal: no joint deformities        Data Reviewed: I have  personally reviewed following labs and imaging studies  CBC: Recent Labs  Lab 08/16/21 0512  WBC 6.1  HGB 8.1*  HCT 27.7*  MCV 93.6  PLT 106   Basic Metabolic Panel: Recent Labs  Lab 08/13/21 0500 08/14/21 0500 08/16/21 0512 08/18/21 1051  NA 133* 133* 136 137  K 4.2 3.9 4.6 4.6  CL 97* 96* 106 110  CO2 26 30 27 25   GLUCOSE 101* 75 91 101*  BUN 23 29* 28* 23  CREATININE 0.52 0.62 0.49 0.45  CALCIUM 8.1* 8.0* 7.9* 8.2*  MG 1.9 2.0 2.1  --   PHOS 3.4  --  3.7  --    GFR: Estimated Creatinine Clearance: 90.8 mL/min (by C-G formula based on SCr of 0.45 mg/dL). Liver Function Tests: Recent Labs  Lab 08/13/21 0500 08/16/21 0512  AST 23 21  ALT 19 17  ALKPHOS 137* 130*  BILITOT 0.4 0.6  PROT 5.9* 5.7*  ALBUMIN 1.5* 1.7*   No results for input(s): LIPASE, AMYLASE in the last 168 hours. No results for input(s): AMMONIA in the last 168 hours. Coagulation Profile: No results for input(s): INR, PROTIME in the  last 168 hours. Cardiac Enzymes: No results for input(s): CKTOTAL, CKMB, CKMBINDEX, TROPONINI in the last 168 hours. BNP (last 3 results) No results for input(s): PROBNP in the last 8760 hours. HbA1C: No results for input(s): HGBA1C in the last 72 hours. CBG: No results for input(s): GLUCAP in the last 168 hours. Lipid Profile: No results for input(s): CHOL, HDL, LDLCALC, TRIG, CHOLHDL, LDLDIRECT in the last 72 hours. Thyroid Function Tests: No results for input(s): TSH, T4TOTAL, FREET4, T3FREE, THYROIDAB in the last 72 hours. Anemia Panel: No results for input(s): VITAMINB12, FOLATE, FERRITIN, TIBC, IRON, RETICCTPCT in the last 72 hours.    Radiology Studies: I have reviewed all of the imaging during this hospital visit personally     Scheduled Meds:  amiodarone  200 mg Oral Daily   Chlorhexidine Gluconate Cloth  6 each Topical Daily   citalopram  20 mg Oral Daily   enoxaparin (LOVENOX) injection  40 mg Subcutaneous Q24H   levothyroxine  150 mcg  Oral Q0600   loratadine  10 mg Oral Daily   mouth rinse  15 mL Mouth Rinse BID   metoprolol tartrate  12.5 mg Oral BID   pantoprazole  40 mg Oral Q0600   sodium chloride flush  10-40 mL Intracatheter Q12H   Continuous Infusions:  TPN ADULT (ION) 105 mL/hr at 08/18/21 1740   TPN ADULT (ION)       LOS: 32 days        Berneita Sanagustin Gerome Apley, MD

## 2021-08-19 NOTE — Progress Notes (Signed)
PHARMACY - TOTAL PARENTERAL NUTRITION CONSULT NOTE  Indication: Fistula  Patient Measurements: Height: _0  (157.5 cm) Weight: (!) 151 kg (332 lb 14.3 oz) IBW/kg (Calculated) : 50.1 TPN AdjBW (KG): 66.6 Body mass index is 60.89 kg/m.  Assessment:  43 YOF with recent R-hemicolectomy on 8/12 in the setting of a cecal mass - consistent with R-colon metastatic adenocarcinoma. 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 Pharmacy consulted to manage TPN for nutritional support in the setting of a colocutaneous fistula.    Glucose / Insulin: CBGs controlled on BMETs, SSI d/c'd 8/28 Electrolytes: 9/24 labs - all WNL (K high normal) Renal:  SCr < 1 stable, BUN WNL Hepatic: LFTs WNL except downtrending alk phos to 130, tbili / TG WNL, albumin 1.7 Intake / Output; MIVF: UOP 0.3 ml/kg/hr, drain 21m, LBM 9/24 GI Imaging:  8/24 CT: colocutaneous fistula, abd wall abscess, soft tissue edema, cellulitis vs nec fasciitis 8/25 Abd xray: diffuse abd wall emphysema, nonobstructive bowel gas pattern 9/4 CT: medial upper left thigh hematoma 9/17 CT: no evidence of communication between bowel and subQ tissue. Decrease volume extensive subcutaneous gas along the RIGHT abd wall and upper groin. GI Surgeries / [facility]:  [facility] 8/24: Midline wound opened by Dr. JArnoldo Moraleat bedside  Central access: PICC replaced 07/27/21 TPN start date: 07/19/21  Nutritional Goals (RD assessment on 9/22): 2300-2500 kcal, 140-160g AA, fluid >/= 2L per day  Current Nutrition:  TPN Back to NPO 9/20 to allow fistula to close  Plan:  Continue TPN at goal rate of 105 ml/hr, providing 151g AA 312g CHO and 71g ILE for a total of 2373 kCal, meeting 100% of estimated needs Electrolytes in TPN: Na 150 mEq/L, reduce K slightly to 440m/L, Ca 11m75mL, Mg 52m53m, Phos 20mm31m, max CL Add standard MVI and trace elements to TPN Standard TPN labs on Mon  and Thurs Consider cycling TPN if patient to remain NPO for a extended period of time  Ladarius Seubert D. Artrell Lawless,Mina MarblermD, BCPS, BCCCPWind[place]/2022, 7:20 AM

## 2021-08-19 NOTE — Progress Notes (Signed)
MD verbal orders okay to use PICC line after verify PICC placement with XR.

## 2021-08-19 NOTE — Progress Notes (Addendum)
Secure chat sent to MD regarding PICC tip placement in wrong position. Hello Dr. Cathlean Sauer, it's Mickel Baas from the IVT. This PICC is not in the proper position to hang TPN. The tip needs to be in the lower SVC/CAJ. It will need to be replaced/exchanged in order for [place] to hang this. Thanks!

## 2021-08-19 NOTE — Progress Notes (Signed)
This RN assessed for documented pressure injuries on coccyx and R thigh. No pressure injuries found, however significant redness due to MASD.

## 2021-08-19 NOTE — Progress Notes (Addendum)
Secure chat sent to the on call MD, Dr. Marlyce Huge.His response as below. good evening.  thanks for the heads up larua.  Dr. Cathlean Sauer is currenly off shift but if we can have the bedside nurse get a message to the PICC team in the AM for an exchange that would be great.  I suppose we will hold hte TPN in the meantime  Thanks so much, Dr. Marlyce Huge!! I will place a verbal order to hold TPN tonight, and place an order for a PICC exchange tomorrow. Have a great evening!!

## 2021-08-20 ENCOUNTER — Inpatient Hospital Stay (HOSPITAL_COMMUNITY): Payer: Medicare HMO

## 2021-08-20 DIAGNOSIS — A419 Sepsis, unspecified organism: Secondary | ICD-10-CM | POA: Diagnosis not present

## 2021-08-20 DIAGNOSIS — L03311 Cellulitis of abdominal wall: Secondary | ICD-10-CM | POA: Diagnosis not present

## 2021-08-20 DIAGNOSIS — K632 Fistula of intestine: Secondary | ICD-10-CM | POA: Diagnosis not present

## 2021-08-20 DIAGNOSIS — N179 Acute kidney failure, unspecified: Secondary | ICD-10-CM | POA: Diagnosis not present

## 2021-08-20 LAB — GLUCOSE, CAPILLARY
Glucose-Capillary: 69 mg/dL — ABNORMAL LOW (ref 70–99)
Glucose-Capillary: 81 mg/dL (ref 70–99)
Glucose-Capillary: 78 mg/dL (ref 70–99)
Glucose-Capillary: 84 mg/dL (ref 70–99)

## 2021-08-20 LAB — COMPREHENSIVE METABOLIC PANEL
ALT: 32 U/L (ref 0–44)
AST: 51 U/L — ABNORMAL HIGH (ref 15–41)
Albumin: 1.7 g/dL — ABNORMAL LOW (ref 3.5–5.0)
Alkaline Phosphatase: 119 U/L (ref 38–126)
Anion gap: 4 — ABNORMAL LOW (ref 5–15)
BUN: 18 mg/dL (ref 8–23)
CO2: 25 mmol/L (ref 22–32)
Calcium: 8 mg/dL — ABNORMAL LOW (ref 8.9–10.3)
Chloride: 107 mmol/L (ref 98–111)
Creatinine, Ser: 0.52 mg/dL (ref 0.44–1.00)
GFR, Estimated: >60 mL/min (ref >60)
Glucose, Bld: 71 mg/dL (ref 70–99)
Potassium: 4.5 mmol/L (ref 3.5–5.1)
Sodium: 136 mmol/L (ref 135–145)
Total Bilirubin: 0.7 mg/dL (ref 0.3–1.2)
Total Protein: 5.8 g/dL — ABNORMAL LOW (ref 6.5–8.1)

## 2021-08-20 LAB — MAGNESIUM: Magnesium: 1.7 mg/dL (ref 1.7–2.4)

## 2021-08-20 LAB — PHOSPHORUS: Phosphorus: 3.1 mg/dL (ref 2.5–4.6)

## 2021-08-20 LAB — TRIGLYCERIDES: Triglycerides: 53 mg/dL (ref <150)

## 2021-08-20 MED ORDER — SODIUM CHLORIDE 0.9% FLUSH
10.0000 mL | Freq: Two times a day (BID) | INTRAVENOUS | Status: DC
  Administered 2021-08-20 – 2021-09-03 (×27): 10 mL
  Administered 2021-09-04: 20 mL
  Administered 2021-09-04 – 2021-09-07 (×5): 10 mL

## 2021-08-20 MED ORDER — ENOXAPARIN SODIUM 80 MG/0.8ML IJ SOSY
0.5000 mg/kg | PREFILLED_SYRINGE | INTRAMUSCULAR | Status: DC
  Administered 2021-08-20 – 2021-09-03 (×15): 75 mg via SUBCUTANEOUS
  Filled 2021-08-20 (×15): qty 0.8

## 2021-08-20 MED ORDER — SODIUM CHLORIDE 0.9% FLUSH
10.0000 mL | INTRAVENOUS | Status: DC | PRN
  Administered 2021-08-23: 20 mL
  Administered 2021-08-30: 10 mL

## 2021-08-20 MED ORDER — TRAVASOL 10 % IV SOLN
INTRAVENOUS | Status: AC
  Filled 2021-08-20: qty 1512

## 2021-08-20 NOTE — Progress Notes (Signed)
Pt vomited a small amount of bile. RN gave zofran and nausea improved.

## 2021-08-20 NOTE — Progress Notes (Deleted)
PHARMACY - TOTAL PARENTERAL NUTRITION CONSULT NOTE  Indication: Fistula  Patient Measurements: Height: 5\' 2"  (157.5 cm) Weight: (!) 151 kg (332 lb 14.3 oz) IBW/kg (Calculated) : 50.1 TPN AdjBW (KG): 66.6 Body mass index is 60.89 kg/m.  Assessment:  66 YOF with recent R-hemicolectomy on 8/12 in the setting of a cecal mass - consistent with R-colon metastatic adenocarcinoma. 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 Pharmacy consulted to manage TPN for nutritional support in the setting of a colocutaneous fistula.    Glucose / Insulin: CBGs controlled on BMETs, SSI d/c'd 8/28 Electrolytes: 9/26 labs - all WNL, except Mag low at 1.7 (cannot replace due to need for PICC exchange - will change in TPN for tonight) Renal:  SCr < 1 stable, BUN WNL Hepatic: LFTs WNL, tbili / TG WNL, albumin 1.7 Intake / Output; MIVF: UOP 0.2 ml/kg/hr, drain 51mL, LBM 9/25 GI Imaging:  8/24 CT: colocutaneous fistula, abd wall abscess, soft tissue edema, cellulitis vs nec fasciitis 8/25 Abd xray: diffuse abd wall emphysema, nonobstructive bowel gas pattern 9/4 CT: medial upper left thigh hematoma 9/17 CT: no evidence of communication between bowel and subQ tissue. Decrease volume extensive subcutaneous gas along the RIGHT abd wall and upper groin. GI Surgeries / Procedures:  8/12 at [facility]: R-hemicolectomy 8/24: Midline wound opened by Dr. Arnoldo Morale at bedside  Central access: PICC replaced 07/27/21 TPN start date: 07/19/21  Nutritional Goals (RD assessment on 9/22): 2300-2500 kcal, 140-160g AA, fluid >/= 2L per day  Current Nutrition:  TPN - not hung 9/25 due to PICC tip not being in the right place. PICC to be exchanged 9/26. Back to NPO 9/20 to allow fistula to close  Plan:  Continue TPN at goal rate of 105 ml/hr, providing 151g AA 312g CHO and 71g ILE for a total of 2373 kCal, meeting 100% of estimated needs Electrolytes in TPN: Na 150 mEq/L,  reduce K slightly to 5mEq/L, Ca 61mEq/L, Incr Mg 41mEq/L, Phos 87mmol/L, max CL Add standard MVI and trace elements to TPN Standard TPN labs on Mon and Thurs Consider cycling TPN if patient to remain NPO for a extended period of time  Lauren [name], PharmD, [place] Clinical Pharmacist Please see AMION for all Pharmacists' Contact Phone Numbers 08/20/2021, 7:46 AM

## 2021-08-20 NOTE — Progress Notes (Signed)
PHARMACY - TOTAL PARENTERAL NUTRITION CONSULT NOTE  Indication: Fistula  Patient Measurements: Height: 5\' 2"  (157.5 cm) Weight: (!) 151 kg (332 lb 14.3 oz) IBW/kg (Calculated) : 50.1 TPN AdjBW (KG): 66.6 Body mass index is 60.89 kg/m.  Assessment:  35 YOF with recent R-hemicolectomy on 8/12 in the setting of a cecal mass - consistent with R-colon metastatic adenocarcinoma. 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 Pharmacy consulted to manage TPN for nutritional support in the setting of a colocutaneous fistula.    Glucose / Insulin: CBGs controlled on BMETs, SSI d/c'd 8/28 Electrolytes: 9/26 labs - all WNL, except Mag low at 1.7 (cannot replace due to need for PICC exchange - will change in TPN for tonight) Renal:  SCr < 1 stable, BUN WNL Hepatic: LFTs WNL, tbili / TG WNL, albumin 1.7 Intake / Output; MIVF: UOP 0.2 ml/kg/hr, drain 51mL, LBM 9/25 GI Imaging:  8/24 CT: colocutaneous fistula, abd wall abscess, soft tissue edema, cellulitis vs nec fasciitis 8/25 Abd xray: diffuse abd wall emphysema, nonobstructive bowel gas pattern 9/4 CT: medial upper left thigh hematoma 9/17 CT: no evidence of communication between bowel and subQ tissue. Decrease volume extensive subcutaneous gas along the RIGHT abd wall and upper groin. GI Surgeries / Procedures:  8/12 at [facility]: R-hemicolectomy 8/24: Midline wound opened by Dr. Arnoldo Morale at bedside  Central access: PICC replaced 07/27/21 TPN start date: 07/19/21  Nutritional Goals (RD assessment on 9/22): 2300-2500 kcal, 140-160g AA, fluid >/= 2L per day  Current Nutrition:  TPN - not hung 9/25 due to PICC tip not being in the right place. PICC to be exchanged 9/26. Back to NPO 9/20 to allow fistula to close  Plan:  Continue TPN at goal rate of 105 ml/hr, providing 151g AA 312g CHO and 71g ILE for a total of 2373 kCal, meeting 100% of estimated needs Electrolytes in TPN: Na 150 mEq/L,  reduce K slightly to 3mEq/L, Ca 45mEq/L, Incr Mg 67mEq/L, Phos 16mmol/L, max CL Add standard MVI and trace elements to TPN Standard TPN labs on Mon and Thurs; will get BMP, Mag and Phos tomorrow due to TPN not hung 9/25. Consider cycling TPN if patient to remain NPO for a extended period of time  Alanda Slim, PharmD, [place] Clinical Pharmacist Please see AMION for all Pharmacists' Contact Phone Numbers 08/20/2021, 8:14 AM

## 2021-08-20 NOTE — Progress Notes (Signed)
Progress Note     Subjective: Patient reports some abdominal soreness but no severe pain. She would like to eat but understands TPN/NPO to try to get fistula to close. Discussed with patient that she may need to continue this for a few weeks and follow up with Dr. Arnoldo Morale.   Objective: Vital signs in last 24 hours: Temp:  [97.8 F (36.6 C)-98.1 F (36.7 C)] 97.9 F (36.6 C) (09/26 0347) Pulse Rate:  [89-97] 89 (09/26 0347) Resp:  [17-20] 20 (09/26 0347) BP: (118-122)/(53-66) 118/62 (09/26 0347) SpO2:  [96 %-98 %] 98 % (09/26 0347) Last BM Date: 08/18/21  Intake/Output from previous day: 09/25 0701 - 09/26 0700 In: 120 [P.O.:120] Out: 800 [Urine:800] Intake/Output this shift: No intake/output data recorded.  PE: Gen:  Alert, NAD, pleasant Pulm:  rate and effort normal Abd: obese, soft, ND, NT, +BS, eakins pouch w/ small amount of tan output and air in bag   Lab Results:  No results for input(s): WBC, HGB, HCT, PLT in the last 72 hours. BMET Recent Labs    08/18/21 1051 08/19/21 1605 08/20/21 0626  NA 137  --  136  K 4.6  --  4.5  CL 110  --  107  CO2 25  --  25  GLUCOSE 101*  --  71  BUN 23  --  18  CREATININE 0.45 0.40* 0.52  CALCIUM 8.2*  --  8.0*   PT/INR No results for input(s): LABPROT, INR in the last 72 hours. CMP     Component Value Date/Time   NA 136 08/20/2021 0626   K 4.5 08/20/2021 0626   CL 107 08/20/2021 0626   CO2 25 08/20/2021 0626   GLUCOSE 71 08/20/2021 0626   BUN 18 08/20/2021 0626   CREATININE 0.52 08/20/2021 0626   CALCIUM 8.0 (L) 08/20/2021 0626   PROT 5.8 (L) 08/20/2021 0626   ALBUMIN 1.7 (L) 08/20/2021 0626   AST 51 (H) 08/20/2021 0626   ALT 32 08/20/2021 0626   ALKPHOS 119 08/20/2021 0626   BILITOT 0.7 08/20/2021 0626   GFRNONAA >60 08/20/2021 0626   GFRAA >90 03/28/2014 0435   Lipase     Component Value Date/Time   LIPASE 25 03/27/2014 0600       Studies/Results: DG Chest 1 View  Result Date:  08/19/2021 CLINICAL DATA:  PICC placement. EXAM: CHEST  1 VIEW COMPARISON:  08/12/2021 FINDINGS: Right sided PICC tip projects over the right clavicular head, at the expected location of the confluence of the right subclavian vein and superior vena cava. Left lung base opacity is unchanged, obscuring the left hemidiaphragm, consistent with pleural fluid and atelectasis. Pneumonia is possible. Remainder of the lungs is clear. No pneumothorax. IMPRESSION: 1. Right-sided PICC has its tip at the expected location of the confluence of the right subclavian vein and superior vena cava. 2. Persistent left lung base opacity. Electronically Signed   By: Lajean Manes M.D.   On: 08/19/2021 16:39    Anti-infectives: Anti-infectives (From admission, onward)    Start     Dose/Rate Route Frequency Ordered Stop   08/12/21 0415  clindamycin (CLEOCIN) IVPB 600 mg        600 mg 100 mL/hr over 30 Minutes Intravenous  Once 08/12/21 0317 08/12/21 0434   07/20/21 1000  remdesivir 100 mg in sodium chloride 0.9 % 100 mL IVPB       See Hyperspace for full Linked Orders Report.   100 mg 200 mL/hr over 30 Minutes Intravenous  Daily 07/19/21 1040 07/21/21 1148   07/20/21 0200  vancomycin (VANCOREADY) IVPB 750 mg/150 mL  Status:  Discontinued        750 mg 150 mL/hr over 60 Minutes Intravenous Every 24 hours 07/19/21 1036 07/19/21 1449   07/19/21 2300  anidulafungin (ERAXIS) 100 mg in sodium chloride 0.9 % 100 mL IVPB  Status:  Discontinued        100 mg 78 mL/hr over 100 Minutes Intravenous Every 24 hours 07/18/21 2107 07/19/21 1449   07/19/21 1600  vancomycin (VANCOREADY) IVPB 750 mg/150 mL  Status:  Discontinued        750 mg 150 mL/hr over 60 Minutes Intravenous Every 24 hours 07/19/21 0919 07/19/21 1016   07/19/21 1600  vancomycin (VANCOREADY) IVPB 750 mg/150 mL  Status:  Discontinued        750 mg 150 mL/hr over 60 Minutes Intravenous Every 24 hours 07/19/21 1016 07/19/21 1036   07/19/21 1400   piperacillin-tazobactam (ZOSYN) IVPB 3.375 g  Status:  Discontinued        3.375 g 12.5 mL/hr over 240 Minutes Intravenous Every 8 hours 07/19/21 1016 07/31/21 1501   07/19/21 1130  remdesivir 200 mg in sodium chloride 0.9% 250 mL IVPB       See Hyperspace for full Linked Orders Report.   200 mg 580 mL/hr over 30 Minutes Intravenous Once 07/19/21 1040 07/19/21 1241   07/19/21 0100  metroNIDAZOLE (FLAGYL) IVPB 500 mg  Status:  Discontinued        500 mg 100 mL/hr over 60 Minutes Intravenous Every 12 hours 07/18/21 1203 07/18/21 1534   07/18/21 2200  ceFEPIme (MAXIPIME) 2 g in sodium chloride 0.9 % 100 mL IVPB  Status:  Discontinued        2 g 200 mL/hr over 30 Minutes Intravenous Every 12 hours 07/18/21 1156 07/18/21 1203   07/18/21 2200  meropenem (MERREM) 2 g in sodium chloride 0.9 % 100 mL IVPB  Status:  Discontinued        2 g 200 mL/hr over 30 Minutes Intravenous Every 12 hours 07/18/21 2104 07/19/21 1011   07/18/21 2200  anidulafungin (ERAXIS) 200 mg in sodium chloride 0.9 % 200 mL IVPB        200 mg 78 mL/hr over 200 Minutes Intravenous  Once 07/18/21 2108 07/19/21 0419   07/18/21 1800  clindamycin (CLEOCIN) IVPB 900 mg  Status:  Discontinued        900 mg 100 mL/hr over 30 Minutes Intravenous Every 8 hours 07/18/21 1534 07/18/21 2103   07/18/21 1600  vancomycin (VANCOREADY) IVPB 2000 mg/400 mL  Status:  Discontinued        2,000 mg 200 mL/hr over 120 Minutes Intravenous Every 24 hours 07/18/21 1544 07/19/21 0919   07/18/21 1545  piperacillin-tazobactam (ZOSYN) IVPB 3.375 g  Status:  Discontinued        3.375 g 12.5 mL/hr over 240 Minutes Intravenous Every 8 hours 07/18/21 1544 07/18/21 2104   07/18/21 1300  cefTRIAXone (ROCEPHIN) 2 g in sodium chloride 0.9 % 100 mL IVPB  Status:  Discontinued        2 g 200 mL/hr over 30 Minutes Intravenous Every 24 hours 07/18/21 1203 07/18/21 1628   07/18/21 1200  ceFEPIme (MAXIPIME) 2 g in sodium chloride 0.9 % 100 mL IVPB  Status:   Discontinued        2 g 200 mL/hr over 30 Minutes Intravenous Every 12 hours 07/18/21 1155 07/18/21 1156   07/18/21 0930  ceFEPIme (MAXIPIME) 2 g in sodium chloride 0.9 % 100 mL IVPB        2 g 200 mL/hr over 30 Minutes Intravenous  Once 07/18/21 0918 07/18/21 1135   07/18/21 0930  metroNIDAZOLE (FLAGYL) IVPB 500 mg  Status:  Discontinued        500 mg 100 mL/hr over 60 Minutes Intravenous  Once 07/18/21 5400 07/18/21 1534        Assessment/Plan 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 Arnoldo Morale at [facility] on 8/12 - path w/ adenocarcinoma. Oncology has seen on 9/5 - Midline wound opened by Dr. 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 Made NPO again on 9/20. Cont TPN - Appreciate palliative assistance with GOC - patient is stable for discharge to SNF on TPN from a surgical standpoint and can follow up with Dr. Arnoldo Morale. Typically would give several weeks of NPO and TPN to try to allow fistula to close on its own but will defer outpatient management of this to Dr. Arnoldo Morale   FEN - NPO. TPN. IVF per primary VTE - SCDs, Lovenox, per primary  ID - None currently.    COVID + A. Fib on Xarelto at home  ABL anemia - FOBT positive 8/24 Hypothyroidism Severe protein calorie malnutrition - albumin 1.3. Pre-alb <5. On TNA  LOS: 33 days    Norm Parcel, [facility] 08/20/2021, 9:44 AM Please see Amion for pager number during day hours 7:00am-4:30pm

## 2021-08-20 NOTE — Progress Notes (Signed)
Peripherally Inserted Central Catheter Placement  The IV Nurse has discussed with the patient and/or persons authorized to consent for the patient, the purpose of this procedure and the potential benefits and risks involved with this procedure.  The benefits include less needle sticks, lab draws from the catheter, and the patient may be discharged home with the catheter. Risks include, but not limited to, infection, bleeding, blood clot (thrombus formation), and puncture of an artery; nerve damage and irregular heartbeat and possibility to perform a PICC exchange if needed/ordered by physician.  Alternatives to this procedure were also discussed.  Bard Power PICC patient education guide, fact sheet on infection prevention and patient information card has been provided to patient /or left at bedside.  PICC exchanged due to malposition, original consent signed 07/19/21 1515.  PICC Placement Documentation  PICC Double Lumen 08/20/21 PICC Right  39 cm 0 cm (Active)  Indication for Insertion or Continuance of Line Administration of hyperosmolar/irritating solutions (i.e. TPN, Vancomycin, etc.) 08/20/21 1254  Exposed Catheter (cm) 0 cm 08/20/21 1254  Site Assessment Clean;Dry;Intact 08/20/21 1254  Lumen #1 Status Flushed;Saline locked;Blood return noted 08/20/21 1254  Lumen #2 Status Flushed;Saline locked;Blood return noted 08/20/21 1254  Dressing Type Transparent 08/20/21 1254  Dressing Status Clean;Dry;Intact 08/20/21 1254  Antimicrobial disc in place? Yes 08/20/21 1254  Safety Lock Not Applicable 58/09/98 3382  Line Care Connections checked and tightened 08/20/21 1254  Dressing Intervention New dressing 08/20/21 1254  Dressing Change Due 08/27/21 08/20/21 1254       Lauren [name], [name] 08/20/2021, 12:56 PM

## 2021-08-20 NOTE — Progress Notes (Signed)
PROGRESS NOTE    Lauren [name]  BVQ:[id number] DOB: [birth year] DOA: 07/18/2021 PCP: Pcp, No    Brief Narrative:  Lauren [name] was admitted to the hospital with the working diagnosis of colocutaneous fistula with surrounding cellulitis/ abscess, complicated with septic shock.    75 year old female who was brought to the hospital due to hypotension.  Recent hospitalization 07/03/21-07/17/2021 for gastrointestinal bleeding due to cecal mass.  She had right hemicolectomy on 07/06/2021.  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  Patient had nausea, abdominal pain, and right-sided back pain.  On her initial physical examination she was hypotensive, refractive intravenous fluids.  She was placed on vasopressors and broad-spectrum antibiotic therapy.   CT of the abdomen pelvis right hemicolectomy with a suture site in the mid abdomen.  Positive fistulous connection between the bowel adjacent to the suture into the overlying abdominal wall and to the skin surface through a pre-existing umbilical hernia.  The fistula is in communication with a focal collection with layering debris's measuring up to 7.4 cm in the abdominal wall consistent with abscess. Extensive soft tissue emphysema tracking through the body wall.  No free intraperitoneal air identified.   Admitted to [facility] intensive care unit, placed on broad-spectrum antibiotic therapy including meropenem, vancomycin, antifungal therapy. General surgery was consulted. Patient transferred to [facility] for further care.  Vasopressors were weaned off successfully. TPN started on 8/25.   Transferred to [facility] 8/27.    Follow-up CT 9/17 with overall improvement and no signs of persistent communication within the bowel lumen and wound.   Patient slowly progressing but continue to have fistula drainage Patient remains NPO.    On TPN for nutrition, has peripheral edema. She has been very frustrated because can not eat.  Fistula may take 8  to 9 weeks to close, until then it is recommended patient to stay NPO, otherwise fistula can become chronic.   Assessment & Plan:   Principal Problem:   Septic shock ([place]) Active Problems:   Hypokalemia   Morbid obesity with BMI of 50.0-59.9, adult (HCC)   Colon carcinoma (HCC)   Enterocutaneous fistula   Cellulitis   AKI (acute kidney injury) ([place])   Hyponatremia   Abdominal pain   Post operative colocutaneous fistula with cellulitis and abscess, complicated with sepsis (present on admission). New diagnosis of colon adenocarcinoma. Completed antibiotic therapy.  Persistent fistula drainage, documented 40 cc    Oncology follow up as outpatient, no current evidence of metastasis.    I have explained patient that it may take another 4 weeks for ulcer to heal. Today she is asking of a soda. Will allow to have her soda for today and continue NPO and TPN for now. She will think about continue current treatment plan or advance po intake and let fistula become chronic.   Hypoglycemia this am.    2. Left thigh hematoma, with acute blood loss anemia.  sp 4 units PRBC transfusion.     3. Atrial fibrillation with RVR.  Continue with amiodarone and metoprolol for rate control.  Continue with Enoxaparin at prophylactic dosing.    Now off telemetry.    4. Chronic systolic heart failure. LV systolic function decreased to 30%, positive diastolic dysfunction as well.   Patient edematous, likely related to malnutrition and hypoproteinemia.     5. AKI, hyponatremia, hypokalemia, hypomagnesemia, hypochloremia.  Renal function stable, electrolytes within normal range. Continue with TPN per pharmacy protocol.    6. Obesity class 3, bilateral lower extremity chronic  lymphedema.  Coccyx and thigh pressure ulcer (resolved per nursing documentation 09.25) Continue with PT and OT.     Continue with skin care   7. Metabolic encephalopathy with delirium, anxiety and depression,  Encephalopathy  resolved.  On citalopram continue with as needed lorazepam. On hydromorphone and oxycodone for pain control.    8. Hypothyroid. On levothyroxine    9. COVID 19 infection, no viral pneumonia, now off isolation     Status is: Inpatient  Remains inpatient appropriate because:Inpatient level of care appropriate due to severity of illness  Dispo: The patient is from: Home              Anticipated d/c is to: SNF              Patient currently is not medically stable to d/c.   Difficult to place patient No   DVT prophylaxis: Enoxaparin   Code Status:   DNR  Family Communication:  No family at the bedside      Nutrition Status: Nutrition Problem: Inadequate oral intake Etiology: altered GI function, acute illness Signs/Symptoms: NPO status Interventions: TPN    Consultants:  Surgery    Subjective: Patient with no nausea or vomiting, continue to have leak in abdominal fistula. Patient with hypoglycemia this am. She is requesting soda. Very frustrated about not able to take po.   Objective: Vitals:   08/19/21 1447 08/19/21 2114 08/20/21 0347 08/20/21 1000  BP: (!) 119/53 122/66 118/62 109/60  Pulse: 97 92 89 95  Resp: 17 20 20 20   Temp: 97.8 F (36.6 C) 98.1 F (36.7 C) 97.9 F (36.6 C) 98.4 F (36.9 C)  TempSrc: Oral Oral Oral Axillary  SpO2: 96% 97% 98% 99%  Weight:      Height:        Intake/Output Summary (Last 24 hours) at 08/20/2021 1229 Last data filed at 08/20/2021 0300 Gross per 24 hour  Intake 120 ml  Output 800 ml  Net -680 ml   Filed Weights   08/15/21 0530 08/16/21 0343 08/18/21 0410  Weight: (!) 143 kg (!) 146.7 kg (!) 151 kg    Examination:   General: Not in pain or dyspnea, deconditioned  Neurology: Awake and alert, non focal  E ENT: mild pallor, no icterus, oral mucosa moist Cardiovascular: No JVD. S1-S2 present, rhythmic, no gallops, rubs, or murmurs. positive lower extremity edema. Pulmonary: Positive breath sounds bilaterally,  adequate air movement, no wheezing, rhonchi or rales. Gastrointestinal. Abdomen distended, positive open abdominal wound bag in place, positive yellow liquid Skin. No rashes Musculoskeletal: no joint deformities     Data Reviewed: I have personally reviewed following labs and imaging studies  CBC: Recent Labs  Lab 08/16/21 0512  WBC 6.1  HGB 8.1*  HCT 27.7*  MCV 93.6  PLT 277   Basic Metabolic Panel: Recent Labs  Lab 08/14/21 0500 08/16/21 0512 08/18/21 1051 08/19/21 1605 08/20/21 0626  NA 133* 136 137  --  136  K 3.9 4.6 4.6  --  4.5  CL 96* 106 110  --  107  CO2 30 27 25   --  25  GLUCOSE 75 91 101*  --  71  BUN 29* 28* 23  --  18  CREATININE 0.62 0.49 0.45 0.40* 0.52  CALCIUM 8.0* 7.9* 8.2*  --  8.0*  MG 2.0 2.1  --   --  1.7  PHOS  --  3.7  --   --  3.1   GFR: Estimated Creatinine Clearance: 90.8  mL/min (by C-G formula based on SCr of 0.52 mg/dL). Liver Function Tests: Recent Labs  Lab 08/16/21 0512 08/20/21 0626  AST 21 51*  ALT 17 32  ALKPHOS 130* 119  BILITOT 0.6 0.7  PROT 5.7* 5.8*  ALBUMIN 1.7* 1.7*   No results for input(s): LIPASE, AMYLASE in the last 168 hours. No results for input(s): AMMONIA in the last 168 hours. Coagulation Profile: No results for input(s): INR, PROTIME in the last 168 hours. Cardiac Enzymes: No results for input(s): CKTOTAL, CKMB, CKMBINDEX, TROPONINI in the last 168 hours. BNP (last 3 results) No results for input(s): PROBNP in the last 8760 hours. HbA1C: No results for input(s): HGBA1C in the last 72 hours. CBG: Recent Labs  Lab 08/20/21 0752 08/20/21 0851 08/20/21 1137  GLUCAP 69* 81 78   Lipid Profile: Recent Labs    08/20/21 0626  TRIG 53   Thyroid Function Tests: No results for input(s): TSH, T4TOTAL, FREET4, T3FREE, THYROIDAB in the last 72 hours. Anemia Panel: No results for input(s): VITAMINB12, FOLATE, FERRITIN, TIBC, IRON, RETICCTPCT in the last 72 hours.    Radiology Studies: I have  reviewed all of the imaging during this hospital visit personally     Scheduled Meds:  amiodarone  200 mg Oral Daily   Chlorhexidine Gluconate Cloth  6 each Topical Daily   citalopram  20 mg Oral Daily   enoxaparin (LOVENOX) injection  0.5 mg/kg Subcutaneous Q24H   levothyroxine  150 mcg Oral Q0600   loratadine  10 mg Oral Daily   mouth rinse  15 mL Mouth Rinse BID   metoprolol tartrate  12.5 mg Oral BID   pantoprazole  40 mg Oral Q0600   sodium chloride flush  10-40 mL Intracatheter Q12H   Continuous Infusions:  TPN ADULT (ION) 105 mL/hr at 08/19/21 2116   TPN ADULT (ION)       LOS: 33 days        Branndon Tuite Gerome Apley, MD

## 2021-08-21 DIAGNOSIS — A419 Sepsis, unspecified organism: Secondary | ICD-10-CM | POA: Diagnosis not present

## 2021-08-21 DIAGNOSIS — K632 Fistula of intestine: Secondary | ICD-10-CM | POA: Diagnosis not present

## 2021-08-21 DIAGNOSIS — N179 Acute kidney failure, unspecified: Secondary | ICD-10-CM | POA: Diagnosis not present

## 2021-08-21 DIAGNOSIS — L03311 Cellulitis of abdominal wall: Secondary | ICD-10-CM | POA: Diagnosis not present

## 2021-08-21 LAB — PHOSPHORUS: Phosphorus: 3.7 mg/dL (ref 2.5–4.6)

## 2021-08-21 LAB — BASIC METABOLIC PANEL
Anion gap: 3 — ABNORMAL LOW (ref 5–15)
BUN: 18 mg/dL (ref 8–23)
CO2: 24 mmol/L (ref 22–32)
Calcium: 8 mg/dL — ABNORMAL LOW (ref 8.9–10.3)
Chloride: 107 mmol/L (ref 98–111)
Creatinine, Ser: 0.53 mg/dL (ref 0.44–1.00)
GFR, Estimated: >60 mL/min (ref >60)
Glucose, Bld: 84 mg/dL (ref 70–99)
Potassium: 4.4 mmol/L (ref 3.5–5.1)
Sodium: 134 mmol/L — ABNORMAL LOW (ref 135–145)

## 2021-08-21 LAB — GLUCOSE, CAPILLARY
Glucose-Capillary: 76 mg/dL (ref 70–99)
Glucose-Capillary: 103 mg/dL — ABNORMAL HIGH (ref 70–99)

## 2021-08-21 LAB — MAGNESIUM: Magnesium: 1.7 mg/dL (ref 1.7–2.4)

## 2021-08-21 MED ORDER — TRAVASOL 10 % IV SOLN
INTRAVENOUS | Status: AC
  Filled 2021-08-21: qty 1500

## 2021-08-21 NOTE — Progress Notes (Addendum)
PROGRESS NOTE    SHARNETTE KITAMURA  GUY:[id number] DOB: 1946/08/15 DOA: 07/18/2021 PCP: Pcp, No    Brief Narrative:  Mrs. Baik was admitted to the hospital with the working diagnosis of colocutaneous fistula with surrounding cellulitis/ abscess, complicated with septic shock.  Now she has persistent colocutaneous leak. Not able to transfer to SNF until off TPN.    75 year old female who was brought to the hospital due to hypotension.  Recent hospitalization 07/03/21-07/17/2021 for gastrointestinal bleeding due to cecal mass.  She had right hemicolectomy on 07/06/2021.  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   At home patient had nausea, abdominal pain, and right-sided back pain.  On her initial physical examination she was hypotensive, refractive intravenous fluids.  She was placed on vasopressors and broad-spectrum antibiotic therapy.   CT of the abdomen pelvis right hemicolectomy with a suture site in the mid abdomen.  Positive fistulous connection between the bowel adjacent to the suture into the overlying abdominal wall and to the skin surface through a pre-existing umbilical hernia.  The fistula is in communication with a focal collection with layering debris's measuring up to 7.4 cm in the abdominal wall consistent with abscess. Extensive soft tissue emphysema tracking through the body wall.  No free intraperitoneal air identified.   Admitted to [facility] intensive care unit, placed on broad-spectrum antibiotic therapy including meropenem, vancomycin, antifungal therapy. General surgery was consulted. Patient transferred to [facility] for further care.  Vasopressors were weaned off successfully. TPN started on 8/25.   Transferred to [facility] 8/27.    Follow-up CT 9/17 with overall improvement and no signs of persistent communication within the bowel lumen and wound.   Patient slowly progressing but continue to have fistula drainage Patient remains NPO.    On TPN for  nutrition, has peripheral edema. She has been very frustrated because can not eat.  Fistula may take 8 to 9 weeks to close, until then it is recommended patient to stay NPO, otherwise fistula can become chronic.    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 New diagnosis of metastatic colon adenocarcinoma. Completed antibiotic therapy.  Pathology report with adenocarcinoma moderately differentiated with metastasis to one of 12 lymph nodes.    Patient with persistent leak, nutritional support with TPN.  Patient continue to ask for po water and soda. I explained again that in order to close leak she needs to be NPO. She has the option to resume po intake and have chronic leak requiring ostomy care.  Follow up with palliative care.   Completed antibiotic therapy.   SNF not accepting patient on TPN, transition of care looking into possible LTAC.    2. Left thigh hematoma, with acute blood loss anemia.  sp 4 units PRBC transfusion.    Check cbc in am.    3. Atrial fibrillation with RVR.  On amiodarone and metoprolol for rate control.  Because of recent bleed will continue with enoxaparin at prophylactic dosing.    Patient off telemetry monitoring.    4. Chronic systolic heart failure. LV systolic function decreased to 30%, positive diastolic dysfunction as well.    Positive  edema and lymphedema at the lower extremities. Continue with nutritional support.  Avoid volume overload.    5. AKI, hyponatremia, hypokalemia, hypomagnesemia, hypochloremia.  Today renal function with serum cr at 0,53, K is 4,4 and serum bicarbonate at 24, Na is  134.  Continue with TPN per pharmacy protocol    6. Obesity class 3, bilateral lower extremity chronic lymphedema.  Coccyx and thigh pressure ulcer (resolved per nursing documentation 09.25) PT and OT, today patient is out of bed to chair.    7. Metabolic encephalopathy with delirium, anxiety and depression,  Encephalopathy resolved.  Continue with citalopram and PRN lorazepam. Pain control with hydromorphone and oxycodone.    8. Hypothyroid. Continue with levothyroxine    9. COVID 19 infection, no viral pneumonia, now off isolation    Status is: Inpatient  Remains inpatient appropriate because:Inpatient level of care appropriate due to severity of illness  Dispo: The patient is from: Home              Anticipated d/c is to: SNF              Patient currently is not medically stable to d/c.   Difficult to place patient No  DVT prophylaxis: Enoxaparin   Code Status:    DNR   Family Communication:  No family at the bedside      Nutrition Status: Nutrition Problem: Inadequate oral intake Etiology: altered GI function, acute illness Signs/Symptoms: NPO status Interventions: TPN     Consultants:  Surgery    Subjective: Patient with no nausea or vomiting, continue to have leak per abdominal wound, positive pain at her lower extremities.  She is out of bed to chair.   Objective: Vitals:   08/20/21 0347 08/20/21 1000 08/20/21 2130 08/21/21 0423  BP: 118/62 109/60 112/64 103/60  Pulse: 89 95 98 80  Resp: 20 20  18   Temp: 97.9 F (36.6 C) 98.4 F (36.9 C) 98.2 F (36.8 C) 98.1 F (36.7 C)  TempSrc: Oral Axillary Axillary Oral  SpO2: 98% 99% 99% 97%  Weight:      Height:        Intake/Output Summary (Last 24 hours) at 08/21/2021 1222 Last data filed at 08/21/2021 0400 Gross per 24 hour  Intake --  Output 900 ml  Net -900 ml   Filed Weights   08/15/21 0530 08/16/21 0343 08/18/21 0410  Weight: (!) 143 kg (!) 146.7 kg (!) 151 kg    Examination:    General: Not in pain or dyspnea, deconditioned  Neurology: Awake and alert, non focal  E ENT: mild pallor, no icterus, oral mucosa moist Cardiovascular: No JVD. S1-S2 present, rhythmic, no gallops, rubs, or murmurs. positive lower extremity edema, bilaterally ++++. Pulmonary: vesicular breath sounds bilaterally, adequate air movement, no wheezing, rhonchi or rales. Gastrointestinal. Abdomen protuberant. Abdominal wound in place with positive yellowish liquids material Skin. No rashes Musculoskeletal: no joint deformities     Data Reviewed: I have personally reviewed following labs and imaging studies  CBC: Recent Labs  Lab 08/16/21 0512  WBC 6.1  HGB 8.1*  HCT 27.7*  MCV 93.6  PLT 342   Basic Metabolic Panel: Recent Labs  Lab 08/16/21 0512 08/18/21 1051 08/19/21 1605 08/20/21 0626 08/21/21 0905  NA 136 137  --  136 134*  K 4.6 4.6  --  4.5 4.4  CL 106 110  --  107 107  CO2 27 25  --  25 24  GLUCOSE 91 101*  --  71  84  BUN 28* 23  --  18 18  CREATININE 0.49 0.45 0.40* 0.52 0.53  CALCIUM 7.9* 8.2*  --  8.0* 8.0*  MG 2.1  --   --  1.7 1.7  PHOS 3.7  --   --  3.1 3.7   GFR: Estimated Creatinine Clearance: 90.8 mL/min (by C-G formula based on SCr of 0.53 mg/dL). Liver Function Tests: Recent Labs  Lab 08/16/21 0512 08/20/21 0626  AST 21 51*  ALT 17 32  ALKPHOS 130* 119  BILITOT 0.6 0.7  PROT 5.7* 5.8*  ALBUMIN 1.7* 1.7*   No results for input(s): LIPASE, AMYLASE in the last 168 hours. No results for input(s): AMMONIA in the last 168 hours. Coagulation Profile: No results for input(s): INR, PROTIME in the last 168 hours. Cardiac Enzymes: No results for input(s): CKTOTAL, CKMB, CKMBINDEX, TROPONINI in the last 168 hours. BNP (last 3 results) No results for input(s): PROBNP in the last 8760 hours. HbA1C: No results for input(s): HGBA1C in the last 72 hours. CBG: Recent Labs  Lab 08/20/21 0752 08/20/21 0851 08/20/21 1137 08/20/21 1634  GLUCAP 69* 81  78 84   Lipid Profile: Recent Labs    08/20/21 0626  TRIG 53   Thyroid Function Tests: No results for input(s): TSH, T4TOTAL, FREET4, T3FREE, THYROIDAB in the last 72 hours. Anemia Panel: No results for input(s): VITAMINB12, FOLATE, FERRITIN, TIBC, IRON, RETICCTPCT in the last 72 hours.    Radiology Studies: I have reviewed all of the imaging during this hospital visit personally     Scheduled Meds:  amiodarone  200 mg Oral Daily   Chlorhexidine Gluconate Cloth  6 each Topical Daily   citalopram  20 mg Oral Daily   enoxaparin (LOVENOX) injection  0.5 mg/kg Subcutaneous Q24H   levothyroxine  150 mcg Oral Q0600   loratadine  10 mg Oral Daily   mouth rinse  15 mL Mouth Rinse BID   metoprolol tartrate  12.5 mg Oral BID   pantoprazole  40 mg Oral Q0600   sodium chloride flush  10-40 mL Intracatheter Q12H   sodium chloride flush  10-40 mL Intracatheter Q12H   Continuous Infusions:  TPN ADULT (ION) 105 mL/hr at 79/02/40 9735   TPN CYCLIC-ADULT (ION)       LOS: 34 days        Dartanion Teo Gerome Apley, MD

## 2021-08-21 NOTE — Progress Notes (Signed)
Physical Therapy Treatment Patient Details Name: Lauren [name] MRN: [id number] DOB: November 22, 1946 Today's Date: 08/21/2021   History of Present Illness Pt is a 75 y.o. female admitted from [facility] on 0/62/37 with hypotension and Covid (+). Pt with colocutaneous fistula, Afib and new CHf since admission. Pt also with abdominal wall cellulitis and left thigh hematoma requiring blood transfusion. Of note, recent admission (8/9-8/23) to [facility] with partially obstructing tumor in the ascending colon s/p R hemicolectomy 07/06/21. PMH includes A fib, morbid obesity, HTN, lymphedema, hypothyroidism.   PT Comments    Pt slowly progressing with mobility. Today's session focused on standing and transfer training, pt requiring minA to maxA+2, dependent for pericare/washup from bowel incontinence. Pt remains limited by generalized weakness, decreased activity tolerance, poor balance strategies and impaired cognition. Continue to recommend SNF-level therapies to maximize functional mobility and independence.    Recommendations for follow up therapy are one component of a multi-disciplinary discharge planning process, led by the attending physician.  Recommendations may be updated based on patient status, additional functional criteria and insurance authorization.  Follow Up Recommendations  SNF;Supervision/Assistance - 24 hour     Equipment Recommendations  None recommended by PT    Recommendations for Other Services       Precautions / Restrictions Precautions Precautions: Fall;Other (comment) Precaution Comments: BLE lymphedema, ostomy, incontinence Restrictions Weight Bearing Restrictions: No     Mobility  Bed Mobility Overal bed mobility: Needs Assistance Bed Mobility: Supine to Sit     Supine to sit: Mod assist;+2 for physical assistance     General bed mobility comments: Pt requiring max encouragement to participate and perform mobility as independently as possible; modA+2 for trunk  elevation and managing BLEs, scooting hips to EOB    Transfers Overall transfer level: Needs assistance Equipment used: 2 person hand held assist Transfers: Sit to/from [facility] Sit to Stand: Min assist;Mod assist;Max assist;+2 physical assistance;+2 safety/equipment Stand pivot transfers: Max assist;+2 physical assistance;Total assist       General transfer comment: Multiple sit<>stands from EOB with bilateral HHA to elevate trunk and stabilize, initially minA+2 then requiring modA+2, then maxA+2 for final stand; pt able to initiate pivotal steps to recliner with HHA, but quick to fatigue requiring maxA+2 to totalA to safely complete transfer to recliner  Ambulation/Gait                 Stairs             Wheelchair Mobility    Modified Rankin (Stroke Patients Only)       Balance Overall balance assessment: Needs assistance Sitting-balance support: Feet supported;No upper extremity supported Sitting balance-Leahy Scale: Fair Sitting balance - Comments: on eob patient had complaints of LLE pain   Standing balance support: Bilateral upper extremity supported Standing balance-Leahy Scale: Poor Standing balance comment: Reliant on bilateral HHA and external assist; dependent for pericare and washup while standing                            Cognition Arousal/Alertness: Awake/alert Behavior During Therapy: Flat affect;Anxious Overall Cognitive Status: No family/caregiver present to determine baseline cognitive functioning Area of Impairment: Attention;Following commands;Safety/judgement;Awareness;Problem solving                   Current Attention Level: Selective   Following Commands: Follows one step commands consistently;Follows multi-step commands inconsistently Safety/Judgement: Decreased awareness of safety;Decreased awareness of deficits Awareness: Intellectual;Emergent Problem Solving: Requires verbal cues  General  Comments: Pt with poor insight into importance of mobility and implication on current status. Unwilling to participate secondary to pain/fatigue/etc. until pt realizing bed soiled, then agreeable. Suspect attention and problem solving limited by pt's anxiety related to mobility      Exercises      General Comments General comments (skin integrity, edema, etc.): Pt unaware of bowel incontinence, +3 assist required for standing pericare/washup as pt reliant on assist +2 to maintain static standing      Pertinent Vitals/Pain Pain Assessment: Faces Pain Score: 9  Faces Pain Scale: Hurts even more Pain Location: L LE Pain Descriptors / Indicators: Discomfort;Grimacing;Guarding Pain Intervention(s): Monitored during session;Limited activity within patient's tolerance;RN gave pain meds during session    Home Living                      Prior Function            PT Goals (current goals can now be found in the care plan section) Acute Rehab PT Goals Patient Stated Goal: to get better Progress towards PT goals: Progressing toward goals (slowly)    Frequency    Min 2X/week      PT Plan Current plan remains appropriate    Co-evaluation PT/OT/SLP Co-Evaluation/Treatment: Yes Reason for Co-Treatment: Necessary to address cognition/behavior during functional activity;For patient/therapist safety;To address functional/ADL transfers PT goals addressed during session: Mobility/safety with mobility;Balance OT goals addressed during session: ADL's and self-care      AM-PAC PT "6 Clicks" Mobility   Outcome Measure  Help needed turning from your back to your side while in a flat bed without using bedrails?: A Lot Help needed moving from lying on your back to sitting on the side of a flat bed without using bedrails?: A Lot Help needed moving to and from a bed to a chair (including a wheelchair)?: A Lot Help needed standing up from a chair using your arms (e.g., wheelchair or  bedside chair)?: A Lot Help needed to walk in hospital room?: Total Help needed climbing 3-5 steps with a railing? : Total 6 Click Score: 10    End of Session   Activity Tolerance: Patient limited by fatigue Patient left: in chair;with call bell/phone within reach;with chair alarm set Nurse Communication: Mobility status;Need for lift equipment PT Visit Diagnosis: Other abnormalities of gait and mobility (R26.89);Muscle weakness (generalized) (M62.81)     Time: 9794-8016 PT Time Calculation (min) (ACUTE ONLY): 34 min  Charges:  $Therapeutic Activity: 8-22 mins                     Mabeline Caras, PT, DPT Acute Rehabilitation Services  Pager [phone] Office [phone]  Derry Lory 08/21/2021, 9:43 AM

## 2021-08-21 NOTE — Progress Notes (Signed)
Progress Note     Subjective: Patient taking a few days to see how she does with NPO and TPN to decide if she wants to continue with this vs just eating and managing fistula. She reports she does not sleep well here and is struggling with finding ways to keep herself busy. She denies abdominal pain. She reports she is having some intermittent nausea but thinks it is from nothing being on her stomach.  Objective: Vital signs in last 24 hours: Temp:  [98.1 F (36.7 C)-98.4 F (36.9 C)] 98.1 F (36.7 C) (09/27 0423) Pulse Rate:  [80-98] 80 (09/27 0423) Resp:  [18-20] 18 (09/27 0423) BP: (103-112)/(60-64) 103/60 (09/27 0423) SpO2:  [97 %-99 %] 97 % (09/27 0423) Last BM Date: 08/19/21  Intake/Output from previous day: 09/26 0701 - 09/27 0700 In: 360 [P.O.:360] Out: 900 [Urine:900] Intake/Output this shift: No intake/output data recorded.  PE: Gen:  Alert, NAD, pleasant Pulm:  rate and effort normal Abd: obese, soft, ND, NT, +BS, eakins pouch w/ small-mod amount of tan output and air in bag   Lab Results:  No results for input(s): WBC, HGB, HCT, PLT in the last 72 hours. BMET Recent Labs    08/18/21 1051 08/19/21 1605 08/20/21 0626  NA 137  --  136  K 4.6  --  4.5  CL 110  --  107  CO2 25  --  25  GLUCOSE 101*  --  71  BUN 23  --  18  CREATININE 0.45 0.40* 0.52  CALCIUM 8.2*  --  8.0*   PT/INR No results for input(s): LABPROT, INR in the last 72 hours. CMP     Component Value Date/Time   NA 136 08/20/2021 0626   K 4.5 08/20/2021 0626   CL 107 08/20/2021 0626   CO2 25 08/20/2021 0626   GLUCOSE 71 08/20/2021 0626   BUN 18 08/20/2021 0626   CREATININE 0.52 08/20/2021 0626   CALCIUM 8.0 (L) 08/20/2021 0626   PROT 5.8 (L) 08/20/2021 0626   ALBUMIN 1.7 (L) 08/20/2021 0626   AST 51 (H) 08/20/2021 0626   ALT 32 08/20/2021 0626   ALKPHOS 119 08/20/2021 0626   BILITOT 0.7 08/20/2021 0626   GFRNONAA >60 08/20/2021 0626   GFRAA >90 03/28/2014 0435   Lipase      Component Value Date/Time   LIPASE 25 03/27/2014 0600       Studies/Results: DG Chest 1 View  Result Date: 08/19/2021 CLINICAL DATA:  PICC placement. EXAM: CHEST  1 VIEW COMPARISON:  08/12/2021 FINDINGS: Right sided PICC tip projects over the right clavicular head, at the expected location of the confluence of the right subclavian vein and superior vena cava. Left lung base opacity is unchanged, obscuring the left hemidiaphragm, consistent with pleural fluid and atelectasis. Pneumonia is possible. Remainder of the lungs is clear. No pneumothorax. IMPRESSION: 1. Right-sided PICC has its tip at the expected location of the confluence of the right subclavian vein and superior vena cava. 2. Persistent left lung base opacity. Electronically Signed   By: Lajean Manes M.D.   On: 08/19/2021 16:39   DG CHEST PORT 1 VIEW  Result Date: 08/20/2021 CLINICAL DATA:  Chest x-ray dated August 19, 2021 EXAM: PORTABLE CHEST 1 VIEW COMPARISON:  Chest x-ray dated August 19, 2021 FINDINGS: Right arm PICC tip projects over the expected area of the lower SVC. Cardiac and mediastinal contours are unchanged. Small to moderate layering left pleural effusion, unchanged compared to prior. No evidence of  pneumothorax. No focal consolidation. IMPRESSION: Right arm PICC tip projects over the expected area of the lower SVC. Electronically Signed   By: Yetta Glassman M.D.   On: 08/20/2021 14:01    Anti-infectives: Anti-infectives (From admission, onward)    Start     Dose/Rate Route Frequency Ordered Stop   08/12/21 0415  clindamycin (CLEOCIN) IVPB 600 mg        600 mg 100 mL/hr over 30 Minutes Intravenous  Once 08/12/21 0317 08/12/21 0434   07/20/21 1000  remdesivir 100 mg in sodium chloride 0.9 % 100 mL IVPB       See Hyperspace for full Linked Orders Report.   100 mg 200 mL/hr over 30 Minutes Intravenous Daily 07/19/21 1040 07/21/21 1148   07/20/21 0200  vancomycin (VANCOREADY) IVPB 750 mg/150 mL  Status:   Discontinued        750 mg 150 mL/hr over 60 Minutes Intravenous Every 24 hours 07/19/21 1036 07/19/21 1449   07/19/21 2300  anidulafungin (ERAXIS) 100 mg in sodium chloride 0.9 % 100 mL IVPB  Status:  Discontinued        100 mg 78 mL/hr over 100 Minutes Intravenous Every 24 hours 07/18/21 2107 07/19/21 1449   07/19/21 1600  vancomycin (VANCOREADY) IVPB 750 mg/150 mL  Status:  Discontinued        750 mg 150 mL/hr over 60 Minutes Intravenous Every 24 hours 07/19/21 0919 07/19/21 1016   07/19/21 1600  vancomycin (VANCOREADY) IVPB 750 mg/150 mL  Status:  Discontinued        750 mg 150 mL/hr over 60 Minutes Intravenous Every 24 hours 07/19/21 1016 07/19/21 1036   07/19/21 1400  piperacillin-tazobactam (ZOSYN) IVPB 3.375 g  Status:  Discontinued        3.375 g 12.5 mL/hr over 240 Minutes Intravenous Every 8 hours 07/19/21 1016 07/31/21 1501   07/19/21 1130  remdesivir 200 mg in sodium chloride 0.9% 250 mL IVPB       See Hyperspace for full Linked Orders Report.   200 mg 580 mL/hr over 30 Minutes Intravenous Once 07/19/21 1040 07/19/21 1241   07/19/21 0100  metroNIDAZOLE (FLAGYL) IVPB 500 mg  Status:  Discontinued        500 mg 100 mL/hr over 60 Minutes Intravenous Every 12 hours 07/18/21 1203 07/18/21 1534   07/18/21 2200  ceFEPIme (MAXIPIME) 2 g in sodium chloride 0.9 % 100 mL IVPB  Status:  Discontinued        2 g 200 mL/hr over 30 Minutes Intravenous Every 12 hours 07/18/21 1156 07/18/21 1203   07/18/21 2200  meropenem (MERREM) 2 g in sodium chloride 0.9 % 100 mL IVPB  Status:  Discontinued        2 g 200 mL/hr over 30 Minutes Intravenous Every 12 hours 07/18/21 2104 07/19/21 1011   07/18/21 2200  anidulafungin (ERAXIS) 200 mg in sodium chloride 0.9 % 200 mL IVPB        200 mg 78 mL/hr over 200 Minutes Intravenous  Once 07/18/21 2108 07/19/21 0419   07/18/21 1800  clindamycin (CLEOCIN) IVPB 900 mg  Status:  Discontinued        900 mg 100 mL/hr over 30 Minutes Intravenous Every 8 hours  07/18/21 1534 07/18/21 2103   07/18/21 1600  vancomycin (VANCOREADY) IVPB 2000 mg/400 mL  Status:  Discontinued        2,000 mg 200 mL/hr over 120 Minutes Intravenous Every 24 hours 07/18/21 1544 07/19/21 0919   07/18/21 1545  piperacillin-tazobactam (ZOSYN) IVPB 3.375 g  Status:  Discontinued        3.375 g 12.5 mL/hr over 240 Minutes Intravenous Every 8 hours 07/18/21 1544 07/18/21 2104   07/18/21 1300  cefTRIAXone (ROCEPHIN) 2 g in sodium chloride 0.9 % 100 mL IVPB  Status:  Discontinued        2 g 200 mL/hr over 30 Minutes Intravenous Every 24 hours 07/18/21 1203 07/18/21 1628   07/18/21 1200  ceFEPIme (MAXIPIME) 2 g in sodium chloride 0.9 % 100 mL IVPB  Status:  Discontinued        2 g 200 mL/hr over 30 Minutes Intravenous Every 12 hours 07/18/21 1155 07/18/21 1156   07/18/21 0930  ceFEPIme (MAXIPIME) 2 g in sodium chloride 0.9 % 100 mL IVPB        2 g 200 mL/hr over 30 Minutes Intravenous  Once 07/18/21 0918 07/18/21 1135   07/18/21 0930  metroNIDAZOLE (FLAGYL) IVPB 500 mg  Status:  Discontinued        500 mg 100 mL/hr over 60 Minutes Intravenous  Once 07/18/21 5784 07/18/21 1534        Assessment/Plan 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 Arnoldo Morale at [facility] on 8/12 - path w/ adenocarcinoma. Oncology has seen on 9/5 - Midline wound opened by Dr. 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 Made NPO again on 9/20. Cont TPN - Appreciate palliative assistance with GOC - patient is stable for discharge to SNF on TPN from a surgical standpoint and can follow up with Dr. Arnoldo Morale. Typically would give several weeks of NPO and TPN to try to allow fistula to  close on its own but will defer outpatient management of this to Dr. Arnoldo Morale   FEN - NPO. TPN. IVF per primary VTE - SCDs, Lovenox, per primary  ID - None currently.    COVID + A. Fib on Xarelto at home  ABL anemia - FOBT positive 8/24 Hypothyroidism Severe protein calorie malnutrition - albumin 1.3. Pre-alb <5. On TNA  LOS: 34 days    Norm Parcel, [facility] Surgery 08/21/2021, 8:12 AM Please see Amion for pager number during day hours 7:00am-4:30pm

## 2021-08-21 NOTE — Progress Notes (Signed)
PHARMACY - TOTAL PARENTERAL NUTRITION CONSULT NOTE  Indication: Fistula  Patient Measurements: Height: 5\' 2"  (157.5 cm) Weight: (!) 151 kg (332 lb 14.3 oz) IBW/kg (Calculated) : 50.1 TPN AdjBW (KG): 66.6 Body mass index is 60.89 kg/m.  Assessment:  25 YOF with recent R-hemicolectomy on 8/12 in the setting of a cecal mass - consistent with R-colon metastatic adenocarcinoma. 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 Pharmacy consulted to manage TPN for nutritional support in the setting of a colocutaneous fistula.    Glucose / Insulin: CBGs low normal on 9/26, SSI d/c'd 8/28 Electrolytes: 9/26 labs - Na 134, K 4.4, SCr wnl, Mg 1.7, Phos 3.7 Renal:  SCr < 1 stable, BUN WNL Hepatic: LFTs WNL, tbili / TG WNL, albumin 1.7 Intake / Output; MIVF: UOP 0.2 ml/kg/hr, drain 24mL, LBM 9/25 GI Imaging:  8/24 CT: colocutaneous fistula, abd wall abscess, soft tissue edema, cellulitis vs nec fasciitis 8/25 Abd xray: diffuse abd wall emphysema, nonobstructive bowel gas pattern 9/4 CT: medial upper left thigh hematoma 9/17 CT: no evidence of communication between bowel and subQ tissue. Decrease volume extensive subcutaneous gas along the RIGHT abd wall and upper groin. GI Surgeries / Procedures:  8/12 at [facility]: R-hemicolectomy 8/24: Midline wound opened by Dr. Arnoldo Morale at bedside  Central access: PICC replaced 07/27/21 TPN start date: 07/19/21  Nutritional Goals (RD assessment on 9/22): 2300-2500 kcal, 140-160g AA, fluid >/= 2L per day  Current Nutrition:  TPN - not hung 9/25 due to PICC tip not being in the right place. PICC to be exchanged 9/26. Back to NPO 9/20 to allow fistula to close  Plan:  Start cycling TPN over 18 hours at goal volume of 2500 mL/24 hours, providing 150g AA 312g CHO and 71g ILE for a total of 2362 kCal, meeting 100% of estimated needs Electrolytes in TPN: Na 150 mEq/L, K 48mEq/L, Ca 52mEq/L, Mg 97mEq/L, Phos 70mmol/L,  max CL - no changes  Add standard MVI and trace elements to TPN Standard TPN labs on Mon and Thurs  Albertina Parr, PharmD., BCPS, BCCCP Clinical Pharmacist Please refer to [facility] for unit-specific pharmacist

## 2021-08-21 NOTE — TOC Progression Note (Signed)
Transition of Care [facility]) - Progression Note    Patient Details  Name: Lauren [name] MRN: [id number] Date of Birth: Dec 06, 1945  Transition of Care [facility]) CM/SW Contact  Joanne Chars, LCSW Phone Number: 08/21/2021, 9:05 AM  Clinical Narrative:  Pt was reviewed by [facility] and [facility] and both report they are not able to offer beds.       Expected Discharge Plan: [place] Barriers to Discharge: Continued Medical Work up, SNF Pending bed offer  Expected Discharge Plan and Services Expected Discharge Plan: [place] arrangements for the past 2 months: Single Family Home                                       Social Determinants of Health (SDOH) Interventions    Readmission Risk Interventions Readmission Risk Prevention Plan 07/12/2021  Transportation Screening Complete  Home Care Screening Complete  Medication Review (RN CM) Complete  Some recent data might be hidden

## 2021-08-21 NOTE — Progress Notes (Signed)
Bed bath and CHG complete, pt up in chair at this time

## 2021-08-21 NOTE — Progress Notes (Signed)
Occupational Therapy Treatment Patient Details Name: Lauren [name] MRN: [id number] DOB: 14-Jun-1946 Today's Date: 08/21/2021   History of present illness 75 yo female admitted 2/56 from SNF [facility]) with hypotension and Covid (+). Pt with colocutaneous fistula, Afib and new CHf since admission. Pt also with abdominal wall cellulitis and left thigh hematoma requiring blood transfusion. Pt with recent admission (8/9-8/23) to [facility] with partially obstructing tumor in the ascending colon s/p R hemicolectomy 07/06/21. PMH: A fib, morbid obesity, hypertension, lymphedema, hypothyroidism   OT comments  Patient received in bed and was apprehensive about participating with therapy.  Therapy discovered patient had soiled bed and patient was willing to stand to clean up and transfer to recliner. Patient required min assist +2 to stand as tech assisted with cleaning and progressed to increased assistance.  Patient was SPT to recliner with max assist +2.  Maxi lift pad was placed under patient to aide nursing with transfer back to bed. Acute OT to continue to follow.    Recommendations for follow up therapy are one component of a multi-disciplinary discharge planning process, led by the attending physician.  Recommendations may be updated based on patient status, additional functional criteria and insurance authorization.    Follow Up Recommendations  SNF    Equipment Recommendations  Wheelchair (measurements OT);Wheelchair cushion (measurements OT)    Recommendations for Other Services      Precautions / Restrictions Precautions Precautions: Fall;Other (comment) Precaution Comments: painful LLE, bil LE lymphedema, LUE cellulitis, ostomy, incontinent       Mobility Bed Mobility Overal bed mobility: Needs Assistance Bed Mobility: Supine to Sit     Supine to sit: Mod assist;+2 for physical assistance     General bed mobility comments: HOB raised with patient requiring extra assistance due to  soiled bed    Transfers Overall transfer level: Needs assistance Equipment used: 2 person hand held assist Transfers: Sit to/from [employer] Transfers Sit to Stand: Min assist;+2 safety/equipment Stand pivot transfers: Max assist;+2 physical assistance       General transfer comment: Patient was initially min assist +2 to stand from eob and required more assistance as she fatigue.  Progressed to max assist +2    Balance Overall balance assessment: Needs assistance Sitting-balance support: Feet supported;No upper extremity supported Sitting balance-Leahy Scale: Fair Sitting balance - Comments: on eob patient had complaints of LLE pain   Standing balance support: Bilateral upper extremity supported Standing balance-Leahy Scale: Poor Standing balance comment: OT/PT assisted with standing and transfers                           ADL either performed or assessed with clinical judgement   ADL Overall ADL's : Needs assistance/impaired     Grooming: Wash/dry hands;Wash/dry face;Set up;Sitting Grooming Details (indicate cue type and reason): performed in recliner     Lower Body Bathing: Total assistance;Sit to/from stand Lower Body Bathing Details (indicate cue type and reason): OT/PT assisted patient with standing while tech assisted with bathing Upper Body Dressing : Minimal assistance Upper Body Dressing Details (indicate cue type and reason): Donned gown Lower Body Dressing: Maximal assistance;Bed level Lower Body Dressing Details (indicate cue type and reason): donn footwear             Functional mobility during ADLs: Maximal assistance;+2 for physical assistance General ADL Comments: Patient became weak while standing for bathing and required max assist +2 to transfer to recliner     Vision  Perception     Praxis      Cognition Arousal/Alertness: Awake/alert Behavior During Therapy: Flat affect;Anxious Overall Cognitive Status:  Impaired/Different from baseline Area of Impairment: Safety/judgement                         Safety/Judgement: Decreased awareness of safety;Decreased awareness of deficits     General Comments: Patient apprehensive about participating but was willing when she found out her bed was soiled        Exercises     Shoulder Instructions       General Comments      Pertinent Vitals/ Pain       Pain Assessment: 0-10 Pain Score: 9  Faces Pain Scale: Hurts even more Pain Location: L LE Pain Descriptors / Indicators: Discomfort;Grimacing;Guarding Pain Intervention(s): Premedicated before session  Home Living                                          Prior Functioning/Environment              Frequency  Min 1X/week        Progress Toward Goals  OT Goals(current goals can now be found in the care plan section)  Progress towards OT goals: Progressing toward goals  Acute Rehab OT Goals Patient Stated Goal: to get better OT Goal Formulation: With patient Time For Goal Achievement: 08/11/21 Potential to Achieve Goals: Good ADL Goals Pt Will Perform Grooming: with min guard assist;sitting Additional ADL Goal #1: Max A of one for bed mobility to reduce caregiver burden. Additional ADL Goal #2: Patient will sit edge of bed for up to 5 min to increase independence with grooming and bed mobility.  Plan Discharge plan remains appropriate    Co-evaluation    PT/OT/SLP Co-Evaluation/Treatment: Yes Reason for Co-Treatment: For patient/therapist safety;Complexity of the patient's impairments (multi-system involvement)   OT goals addressed during session: ADL's and self-care      AM-PAC OT "6 Clicks" Daily Activity     Outcome Measure   Help from another person eating meals?: A Little Help from another person taking care of personal grooming?: A Little Help from another person toileting, which includes using toliet, bedpan, or urinal?:  Total Help from another person bathing (including washing, rinsing, drying)?: A Lot Help from another person to put on and taking off regular upper body clothing?: A Lot Help from another person to put on and taking off regular lower body clothing?: Total 6 Click Score: 12    End of Session    OT Visit Diagnosis: Other abnormalities of gait and mobility (R26.89);Muscle weakness (generalized) (M62.81);Other symptoms and signs involving cognitive function;Pain;Adult, failure to thrive (R62.7) Pain - Right/Left: Left Pain - part of body: Hip   Activity Tolerance Other (comment) (Patient tolerated treatment but fatigue became a factor as standing increased)   Patient Left in chair;with call bell/phone within reach;with chair alarm set   Nurse Communication Need for lift equipment        Time: [id number] OT Time Calculation (min): 34 min  Charges: OT General Charges $OT Visit: 1 Visit OT Treatments $Self Care/Home Management : 8-22 mins  Lodema Hong, OTA   Samon Dishner Alexis Goodell 08/21/2021, 9:15 AM

## 2021-08-22 DIAGNOSIS — L03311 Cellulitis of abdominal wall: Secondary | ICD-10-CM | POA: Diagnosis not present

## 2021-08-22 DIAGNOSIS — R6521 Severe sepsis with septic shock: Secondary | ICD-10-CM | POA: Diagnosis not present

## 2021-08-22 DIAGNOSIS — A419 Sepsis, unspecified organism: Secondary | ICD-10-CM | POA: Diagnosis not present

## 2021-08-22 DIAGNOSIS — U071 COVID-19: Secondary | ICD-10-CM | POA: Diagnosis not present

## 2021-08-22 LAB — CBC
HCT: 30.2 % — ABNORMAL LOW (ref 36.0–46.0)
Hemoglobin: 8.8 g/dL — ABNORMAL LOW (ref 12.0–15.0)
MCH: 27.2 pg (ref 26.0–34.0)
MCHC: 29.1 g/dL — ABNORMAL LOW (ref 30.0–36.0)
MCV: 93.5 fL (ref 80.0–100.0)
Platelets: 201 10*3/uL (ref 150–400)
RBC: 3.23 MIL/uL — ABNORMAL LOW (ref 3.87–5.11)
RDW: 19 % — ABNORMAL HIGH (ref 11.5–15.5)
WBC: 5.9 10*3/uL (ref 4.0–10.5)
nRBC: 0 % (ref 0.0–0.2)

## 2021-08-22 LAB — PHOSPHORUS: Phosphorus: 3.7 mg/dL (ref 2.5–4.6)

## 2021-08-22 LAB — BASIC METABOLIC PANEL
Anion gap: 3 — ABNORMAL LOW (ref 5–15)
BUN: 20 mg/dL (ref 8–23)
CO2: 22 mmol/L (ref 22–32)
Calcium: 7.7 mg/dL — ABNORMAL LOW (ref 8.9–10.3)
Chloride: 109 mmol/L (ref 98–111)
Creatinine, Ser: 0.46 mg/dL (ref 0.44–1.00)
GFR, Estimated: >60 mL/min (ref >60)
Glucose, Bld: 103 mg/dL — ABNORMAL HIGH (ref 70–99)
Potassium: 4.5 mmol/L (ref 3.5–5.1)
Sodium: 134 mmol/L — ABNORMAL LOW (ref 135–145)

## 2021-08-22 LAB — GLUCOSE, CAPILLARY
Glucose-Capillary: 102 mg/dL — ABNORMAL HIGH (ref 70–99)
Glucose-Capillary: 100 mg/dL — ABNORMAL HIGH (ref 70–99)
Glucose-Capillary: 100 mg/dL — ABNORMAL HIGH (ref 70–99)
Glucose-Capillary: 69 mg/dL — ABNORMAL LOW (ref 70–99)
Glucose-Capillary: 74 mg/dL (ref 70–99)

## 2021-08-22 LAB — MAGNESIUM: Magnesium: 2 mg/dL (ref 1.7–2.4)

## 2021-08-22 MED ORDER — TRAVASOL 10 % IV SOLN
INTRAVENOUS | Status: AC
  Filled 2021-08-22: qty 1500

## 2021-08-22 MED ORDER — DEXTROSE 50 % IV SOLN
1.0000 | Freq: Once | INTRAVENOUS | Status: AC
  Administered 2021-08-22: 50 mL via INTRAVENOUS
  Filled 2021-08-22: qty 50

## 2021-08-22 NOTE — Progress Notes (Signed)
Daily Progress Note   Patient Name: Lauren [name]       Date: 08/22/2021 DOB: 10/13/1946  Age: 75 y.o. MRN#: [id number] Attending Physician: Darliss Cheney, MD Primary Care Physician: Pcp, No Admit Date: 07/18/2021  Reason for Consultation/Follow-up: Establishing goals of care  Subjective: Chart Reviewed. Updates Received. Patient Assessed.   Patient is awake, alert and oriented x3. Denies pain. Endorses fatigue.   We discussed at length current illness and guarded to poor prognosis. I created space for patient to express her previous quality of life compared to current state. She is tearful expressing the fear of the unknown and being in the hospital for more than a month. Emotional support provided. We discussed the possibility of her fistula healing and what the process will look like vs possibility of not healing and no meaningful recovery in addition to her underlying cancer. Lauren [name] her understanding and expresses although she really wanted to eat previously she does not have that sensation any longer and has more of a desire for something to drink or cold to keep her mouth moist.   Lauren [name] is tearful expressing her wishes sharing goal is to continue with current treatment as she is remaining hopeful for some improvement/stability so that she can discharge to SNF. She knows she cannot return home at this state. Patient states she wishes to allow herself an opportunity to thrive and is not ready to focus on comfort care, however in the upcoming days-weeks if her health does not improve or she reaches a point the medical team feels she will not improve regardless of timeframe she would then reconsider her goals and consider focusing on her comfort.  DNR/DNI confirmed.   Patient's brother is aware of current illness and supports patient's expressed wishes.   All questions and support provided. Patients goals are clear and set.   Length of Stay: 35 days  Vital Signs: BP (!)  104/59 (BP Location: Left Arm)   Pulse 65   Temp 97.7 F (36.5 C) (Oral)   Resp 16   Ht 5\' 2"  (1.575 m)   Wt (!) 151 kg   LMP 09/13/2016   SpO2 98%   BMI 60.89 kg/m  SpO2: SpO2: 98 % O2 Device: O2 Device: Room Air O2 Flow Rate:    Physical Exam: NAD, ill appearing RRR Clear bilaterally  Bilateral lower extremity lymphedema  AAO x3, mood appropriate               Palliative Care Assessment & Plan    Code Status: DNR  Goals of Care/Recommendations: Patient's expressed goals are to continue to treat. She understands healing process may take a significant amount of time with reservation healing may not occur. Wishes to allow time for outcomes with awareness she will remain NPO. Will do whatever it takes to live however when she reaches a point of absolutely no meaningful recovery or healing is unexpected she will then re-evaluate goals of care.  PMT will continue to support and follow.  Please reach out if any questions arise for now Palliative will follow on as needed basis, we will not follow patient daily as goals are clear and set. Recommendations remain for outpatient palliative support. Patient is not a hospice candidate and if she chooses to focus on comfort would require placement for outpatient hospice support vs palliative as her life expectancy does not meet inpatient hospice criteria and unfortunately does not have family to provide the necessary care she needs.   Prognosis:  Extremely Guarded   Discharge Planning: To Be Determined  Thank you for allowing the Palliative Medicine Team to assist in the care of this patient.  Time Total: 45 min.   Visit consisted of counseling and education dealing with the complex and emotionally intense issues of symptom management and palliative care in the setting of serious and potentially life-threatening illness.Greater than 50%  of this time was spent counseling and coordinating care related to the above assessment and  plan.  Lauren [name], AGPCNP-BC  Palliative Medicine Team [phone]

## 2021-08-22 NOTE — Progress Notes (Signed)
PHARMACY - TOTAL PARENTERAL NUTRITION CONSULT NOTE  Indication: Fistula  Patient Measurements: Height: 5\' 2"  (157.5 cm) Weight: (!) 151 kg (332 lb 14.3 oz) IBW/kg (Calculated) : 50.1 TPN AdjBW (KG): 66.6 Body mass index is 60.89 kg/m.  Assessment:  66 YOF with recent R-hemicolectomy on 8/12 in the setting of a cecal mass - consistent with R-colon metastatic adenocarcinoma. 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 Pharmacy consulted to manage TPN for nutritional support in the setting of a colocutaneous fistula.    Glucose / Insulin: CBGs 76-103 (tolerating cycle), SSI d/c'ed  Electrolytes: 9/26 labs - Na 134, K 4.5 (stable), SCr wnl, Mg 2, Phos 3.7 Renal:  SCr < 1 stable, BUN WNL Hepatic: LFTs WNL, tbili / TG WNL, albumin 1.7 Intake / Output; MIVF: UOP 0.2 ml/kg/hr, drain 15mL, LBM 9/25 GI Imaging:  8/24 CT: colocutaneous fistula, abd wall abscess, soft tissue edema, cellulitis vs nec fasciitis 8/25 Abd xray: diffuse abd wall emphysema, nonobstructive bowel gas pattern 9/4 CT: medial upper left thigh hematoma 9/17 CT: no evidence of communication between bowel and subQ tissue. Decrease volume extensive subcutaneous gas along the RIGHT abd wall and upper groin. GI Surgeries / Procedures:  8/12 at [facility]: R-hemicolectomy 8/24: Midline wound opened by Dr. Arnoldo Morale at bedside  Central access: PICC replaced 07/27/21 TPN start date: 07/19/21  Nutritional Goals (RD assessment on 9/22): 2300-2500 kcal, 140-160g AA, fluid >/= 2L per day  Current Nutrition:  TPN - not hung 9/25 due to PICC tip not being in the right place. PICC to be exchanged 9/26. Back to NPO 9/20 to allow fistula to close  Plan:  Cycle TPN over 16 hours at goal volume of 2500 mL/24 hours, providing 150g AA 312g CHO and 71g ILE for a total of 2362 kCal, meeting 100% of estimated needs Electrolytes in TPN: Na 150 mEq/L, K 95mEq/L, Ca 36mEq/L, Mg 7mEq/L, Phos  42mmol/L, max CL - no changes  Add standard MVI and trace elements to TPN Standard TPN labs on Mon and Thurs  Albertina Parr, PharmD., BCPS, BCCCP Clinical Pharmacist Please refer to [facility] for unit-specific pharmacist

## 2021-08-22 NOTE — Plan of Care (Signed)
  Problem: Fluid Volume: Goal: Hemodynamic stability will improve Outcome: Progressing   Problem: Clinical Measurements: Goal: Diagnostic test results will improve Outcome: Progressing Goal: Signs and symptoms of infection will decrease Outcome: Progressing   Problem: Respiratory: Goal: Ability to maintain adequate ventilation will improve Outcome: Progressing   Problem: Education: Goal: Knowledge of General Education information will improve Description: Including pain rating scale, medication(s)/side effects and non-pharmacologic comfort measures Outcome: Progressing   Problem: Health Behavior/Discharge Planning: Goal: Ability to manage health-related needs will improve Outcome: Progressing   Problem: Clinical Measurements: Goal: Ability to maintain clinical measurements within normal limits will improve Outcome: Progressing Goal: Will remain free from infection Outcome: Progressing Goal: Diagnostic test results will improve Outcome: Progressing Goal: Respiratory complications will improve Outcome: Progressing Goal: Cardiovascular complication will be avoided Outcome: Progressing   Problem: Activity: Goal: Risk for activity intolerance will decrease Outcome: Progressing   Problem: Nutrition: Goal: Adequate nutrition will be maintained Outcome: Progressing   Problem: Coping: Goal: Level of anxiety will decrease Outcome: Progressing   Problem: Elimination: Goal: Will not experience complications related to bowel motility Outcome: Progressing Goal: Will not experience complications related to urinary retention Outcome: Progressing   Problem: Pain Managment: Goal: General experience of comfort will improve Outcome: Progressing   Problem: Safety: Goal: Ability to remain free from injury will improve Outcome: Progressing   Problem: Skin Integrity: Goal: Risk for impaired skin integrity will decrease Outcome: Progressing   Problem: Education: Goal:  Knowledge about tracheostomy care/management will improve Outcome: Progressing   Problem: Activity: Goal: Ability to tolerate increased activity will improve Outcome: Progressing   Problem: Health Behavior/Discharge Planning: Goal: Ability to manage tracheostomy will improve Outcome: Progressing   Problem: Respiratory: Goal: Patent airway maintenance will improve Outcome: Progressing   Problem: Role Relationship: Goal: Ability to communicate will improve Outcome: Progressing

## 2021-08-22 NOTE — Progress Notes (Signed)
PROGRESS NOTE    Lauren [name]  DVV:[id number] DOB: 11-18-1946 DOA: 07/18/2021 PCP: Pcp, No    Brief Narrative:  Lauren [name] was admitted to the hospital with the working diagnosis of colocutaneous fistula with surrounding cellulitis/ abscess, complicated with septic shock.  Now she has persistent colocutaneous leak. Not able to transfer to SNF until off TPN.    75 year old female who was brought to the hospital due to hypotension.  Recent hospitalization 07/03/21-07/17/2021 for gastrointestinal bleeding due to cecal mass.  She had right hemicolectomy on 07/06/2021.  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   At home patient had nausea, abdominal pain, and right-sided back pain.  On her initial physical examination she was hypotensive, refractive intravenous fluids.  She was placed on vasopressors and broad-spectrum antibiotic therapy.   CT of the abdomen pelvis right hemicolectomy with a suture site in the mid abdomen.  Positive fistulous connection between the bowel adjacent to the suture into the overlying abdominal wall and to the skin surface through a pre-existing umbilical hernia.  The fistula is in communication with a focal collection with layering debris's measuring up to 7.4 cm in the abdominal wall consistent with abscess. Extensive soft tissue emphysema tracking through the body wall.  No free intraperitoneal air identified.   Admitted to [facility], placed on broad-spectrum antibiotic therapy including meropenem, vancomycin, antifungal therapy. General surgery was consulted. Patient transferred to [facility] for further care.  Vasopressors were weaned off successfully. TPN started on 8/25.   Transferred to [facility] 8/27.    Follow-up CT 9/17 with overall improvement and no signs of persistent communication within the bowel lumen and wound.   Patient slowly progressing but continue to have fistula drainage Patient remains NPO.    On TPN for  nutrition, has peripheral edema. She has been very frustrated because can not eat.  Fistula may take 8 to 9 weeks to close, until then it is recommended patient to stay NPO, otherwise fistula can become chronic.    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 New diagnosis of metastatic colon adenocarcinoma. Completed antibiotic therapy.  Pathology report with adenocarcinoma moderately differentiated with metastasis to one of 12 lymph nodes.    Patient with persistent leak, nutritional support with TPN.  Patient continue to ask for po water and soda.  She was explained by previous hospitalist and by myself today about the potential consequences if she were to take p.o. She has the option to resume po intake and have chronic leak requiring ostomy care.  Follow up with palliative care.   SNF not accepting patient on TPN, transition of care looking into possible LTAC.    2. Left thigh hematoma, with acute blood loss anemia.  sp 4 units PRBC transfusion during this hospitalization.  Hemoglobin is stable.  Monitor.  3. Atrial fibrillation with RVR.  On amiodarone and metoprolol for rate control.  Because of recent bleed will continue with enoxaparin at prophylactic dosing.    4. Chronic systolic heart failure. LV systolic function decreased to 30%, positive diastolic dysfunction as well.  Positive edema and lymphedema at the lower extremities  which is chronic. Continue with nutritional support. Avoid volume overload.    5. AKI, hyponatremia, hypokalemia, hypomagnesemia, hypochloremia.  Resolved   6. Obesity class 3, bilateral lower extremity  chronic lymphedema.  Coccyx and thigh pressure ulcer (resolved per nursing documentation 09.25) PT and OT recommends SNF.  TOC on board.  No SNF will take her unless she is off of TPN.  TOC working on finding LTAC.   7. Metabolic encephalopathy with delirium, anxiety and depression,  Encephalopathy resolved.  Continue with citalopram and PRN lorazepam. Pain control with hydromorphone and oxycodone.    8. Hypothyroid. Continue with levothyroxine    9. COVID 19 infection, no viral pneumonia, now off isolation    Status is: Inpatient  Remains inpatient appropriate because:Inpatient level of care appropriate due to severity of illness  Dispo: The patient is from: Home              Anticipated d/c is to: SNF vs LTAC              Patient currently is medically stable to d/c.   Difficult to place patient YES  DVT prophylaxis: Enoxaparin   Code Status:    DNR   Family Communication:  No family at the bedside      Nutrition Status: Nutrition Problem: Inadequate oral intake Etiology: altered GI function, acute illness Signs/Symptoms: NPO status Interventions: TPN     Consultants:  Surgery    Subjective: Patient seen and examined.  Fully alert and oriented however once again she is asking why she cannot take p.o.  No complaints.  Objective: Vitals:   08/21/21 2013 08/21/21 2015 08/22/21 0303 08/22/21 0800  BP: (!) 71/45 123/82 (!) 99/56 (!) 104/59  Pulse: (!) 33 (!) 117 (!) 110 (!) 47  Resp: 20 20 20 16   Temp: 97.6 F (36.4 C) 97.6 F (36.4 C) 97.6 F (36.4 C) 97.7 F (36.5 C)  TempSrc:   Oral Oral  SpO2: 96% 97% 98% 98%  Weight:      Height:        Intake/Output Summary (Last 24 hours) at 08/22/2021 1058 Last data filed at 08/21/2021 1823 Gross per 24 hour  Intake 2524.91 ml  Output --  Net 2524.91 ml    Filed Weights   08/15/21 0530 08/16/21 0343 08/18/21 0410  Weight: (!) 143 kg (!) 146.7 kg (!) 151 kg    Examination:   General exam: Appears calm and  comfortable, obese Respiratory system: Clear to auscultation. Respiratory effort normal. Cardiovascular system: S1 & S2 heard, RRR. No JVD, murmurs, rubs, gallops or clicks.  Massive bilateral lymphedema and pitting edema. Gastrointestinal system: Abdomen is nondistended, soft and nontender. No organomegaly or masses felt. Normal bowel sounds heard. Central nervous system: Alert and oriented. No focal neurological deficits. Extremities: Symmetric 5 x 5 power. Skin: No rashes, lesions or ulcers.  Psychiatry: Judgement and insight appear poor   Data Reviewed: I have personally reviewed following labs and imaging studies  CBC: Recent Labs  Lab 08/16/21 0512 08/22/21 0149  WBC 6.1 5.9  HGB 8.1* 8.8*  HCT 27.7* 30.2*  MCV 93.6 93.5  PLT 274 474    Basic Metabolic Panel: Recent Labs  Lab 08/16/21 0512 08/18/21 1051 08/19/21 1605 08/20/21 0626 08/21/21 0905 08/22/21 0149  NA 136 137  --  136 134* 134*  K 4.6 4.6  --  4.5 4.4 4.5  CL 106 110  --  107 107 109  CO2 27 25  --  25 24  22  GLUCOSE 91 101*  --  71 84 103*  BUN 28* 23  --  18 18 20   CREATININE 0.49 0.45 0.40* 0.52 0.53 0.46  CALCIUM 7.9* 8.2*  --  8.0* 8.0* 7.7*  MG 2.1  --   --  1.7 1.7 2.0  PHOS 3.7  --   --  3.1 3.7 3.7    GFR: Estimated Creatinine Clearance: 90.8 mL/min (by C-G formula based on SCr of 0.46 mg/dL). Liver Function Tests: Recent Labs  Lab 08/16/21 0512 08/20/21 0626  AST 21 51*  ALT 17 32  ALKPHOS 130* 119  BILITOT 0.6 0.7  PROT 5.7* 5.8*  ALBUMIN 1.7* 1.7*    No results for input(s): LIPASE, AMYLASE in the last 168 hours. No results for input(s): AMMONIA in the last 168 hours. Coagulation Profile: No results for input(s): INR, PROTIME in the last 168 hours. Cardiac Enzymes: No results for input(s): CKTOTAL, CKMB, CKMBINDEX, TROPONINI in the last 168 hours. BNP (last 3 results) No results for input(s): PROBNP in the last 8760 hours. HbA1C: No results for input(s): HGBA1C in the  last 72 hours. CBG: Recent Labs  Lab 08/20/21 1634 08/21/21 2021 08/21/21 2318 08/22/21 0301 08/22/21 0814  GLUCAP 84 76 103* 102* 100*    Lipid Profile: Recent Labs    08/20/21 0626  TRIG 53    Thyroid Function Tests: No results for input(s): TSH, T4TOTAL, FREET4, T3FREE, THYROIDAB in the last 72 hours. Anemia Panel: No results for input(s): VITAMINB12, FOLATE, FERRITIN, TIBC, IRON, RETICCTPCT in the last 72 hours.    Radiology Studies: I have reviewed all of the imaging during this hospital visit personally     Scheduled Meds:  amiodarone  200 mg Oral Daily   Chlorhexidine Gluconate Cloth  6 each Topical Daily   citalopram  20 mg Oral Daily   enoxaparin (LOVENOX) injection  0.5 mg/kg Subcutaneous Q24H   levothyroxine  150 mcg Oral Q0600   loratadine  10 mg Oral Daily   mouth rinse  15 mL Mouth Rinse BID   metoprolol tartrate  12.5 mg Oral BID   pantoprazole  40 mg Oral Q0600   sodium chloride flush  10-40 mL Intracatheter Q12H   sodium chloride flush  10-40 mL Intracatheter Q12H   Continuous Infusions:  TPN CYCLIC-ADULT (ION) 381 mL/hr at 01/75/10 2585   TPN CYCLIC-ADULT (ION)       LOS: 35 days   Total time spent in minutes: 32 minutes Darliss Cheney, MD TRH/hospitalist

## 2021-08-23 DIAGNOSIS — A419 Sepsis, unspecified organism: Secondary | ICD-10-CM | POA: Diagnosis not present

## 2021-08-23 DIAGNOSIS — R6521 Severe sepsis with septic shock: Secondary | ICD-10-CM | POA: Diagnosis not present

## 2021-08-23 LAB — COMPREHENSIVE METABOLIC PANEL
ALT: 27 U/L (ref 0–44)
AST: 32 U/L (ref 15–41)
Albumin: 1.6 g/dL — ABNORMAL LOW (ref 3.5–5.0)
Alkaline Phosphatase: 116 U/L (ref 38–126)
Anion gap: 3 — ABNORMAL LOW (ref 5–15)
BUN: 23 mg/dL (ref 8–23)
CO2: 22 mmol/L (ref 22–32)
Calcium: 7.8 mg/dL — ABNORMAL LOW (ref 8.9–10.3)
Chloride: 111 mmol/L (ref 98–111)
Creatinine, Ser: 0.5 mg/dL (ref 0.44–1.00)
GFR, Estimated: >60 mL/min (ref >60)
Glucose, Bld: 98 mg/dL (ref 70–99)
Potassium: 4.5 mmol/L (ref 3.5–5.1)
Sodium: 136 mmol/L (ref 135–145)
Total Bilirubin: 0.3 mg/dL (ref 0.3–1.2)
Total Protein: 5.3 g/dL — ABNORMAL LOW (ref 6.5–8.1)

## 2021-08-23 LAB — GLUCOSE, CAPILLARY
Glucose-Capillary: 110 mg/dL — ABNORMAL HIGH (ref 70–99)
Glucose-Capillary: 111 mg/dL — ABNORMAL HIGH (ref 70–99)
Glucose-Capillary: 75 mg/dL (ref 70–99)
Glucose-Capillary: 77 mg/dL (ref 70–99)
Glucose-Capillary: 79 mg/dL (ref 70–99)
Glucose-Capillary: 92 mg/dL (ref 70–99)
Glucose-Capillary: 97 mg/dL (ref 70–99)

## 2021-08-23 LAB — MAGNESIUM: Magnesium: 2 mg/dL (ref 1.7–2.4)

## 2021-08-23 LAB — PHOSPHORUS: Phosphorus: 3.5 mg/dL (ref 2.5–4.6)

## 2021-08-23 MED ORDER — TORSEMIDE 20 MG PO TABS
20.0000 mg | ORAL_TABLET | Freq: Two times a day (BID) | ORAL | Status: DC
  Administered 2021-08-23 – 2021-08-25 (×5): 20 mg via ORAL
  Filled 2021-08-23 (×6): qty 1

## 2021-08-23 MED ORDER — TRAVASOL 10 % IV SOLN
INTRAVENOUS | Status: AC
  Filled 2021-08-23: qty 1500

## 2021-08-23 NOTE — Progress Notes (Signed)
Nutrition Follow-up  DOCUMENTATION CODES:  Not applicable  INTERVENTION:  -Continue TPN management per Pharmacy  NUTRITION DIAGNOSIS:  Inadequate oral intake related to altered GI function, acute illness as evidenced by NPO status. -- ongoing  GOAL:  Patient will meet greater than or equal to 90% of their needs -- met with TPN   MONITOR:  Labs, Weight trends, Skin, I & O's, Other (Comment) (TPN)  REASON FOR ASSESSMENT:  Consult Assessment of nutrition requirement/status, New TPN/TNA  ASSESSMENT:  75 year old female who presented to the ED on 8/24 s/p CPR. Pt with recent admission from 8/09 to 8/23 with anemia from GI bleed secondary to cecal mass s/p R hemicolectomy with primary anastomosis on 07/06/21. 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 Pt also found to be positive for COVID-19 on admission. PMH of atrial fibrillation, CHF, HTN, lymphedema, mitral regurgitation. Pt admitted with septic shock with abdominal wall fluid collection and concern for necrotizing fasciitis.  9/11- diet advanced to clear liquids 9/13 - diet advanced to full liquids 9/15 - Eakin's pouch changed 9/21 - pt made NPO due to concern that fistula not completely closed  Pt with persistent colocutaneous fistula leak and remains NPO w/ TPN meeting 100% of nutrition needs. Unable to transfer to SNF until off TPN. Swelling/edema worsening, MD resuming toresmide. PMT following; pt wishes to continue receiving full scope of treatment.   UOP: 1662m x24 hours Fistula output: 337mx24 hours I/O: +24.6L since admit  Per RN edema assessment, pt w/ mild pitting edema to BUE and deep pitting edema to BLE.   Labs reviewed. CBGs 74-111 x 24 hours Medications: Protonix, torsemide  Diet Order:   Diet Order             Diet NPO time specified Except for: Ice Chips, Sips with Meds  Diet effective now                  EDUCATION NEEDS:  Not appropriate for education at this time  Skin:   Skin Assessment: Skin Integrity Issues: Skin Integrity Issues:: Other (Comment) Other: pressure injury to coccyx, pressure injury to R thigh, Eakins pouch to abdomen, MASD to bilateral buttocks  Last BM:  9/28  Height:  Ht Readings from Last 1 Encounters:  07/18/21 5' 2"  (1.575 m)   Weight:  Wt Readings from Last 1 Encounters:  08/18/21 (!) 151 kg   Ideal Body Weight:  50 kg  BMI:  Body mass index is 60.89 kg/m.  Estimated Nutritional Needs:  Kcal:  234696-2952rotein:  140-160 grams Fluid:  >/= 2.0 L    AmLarkin InaMS, RD, LDN (she/her/hers) RD pager number and weekend/on-call pager number located in [email or web address]

## 2021-08-23 NOTE — Progress Notes (Signed)
PHARMACY - TOTAL PARENTERAL NUTRITION CONSULT NOTE  Indication: Fistula  Patient Measurements: Height: 5\' 2"  (157.5 cm) Weight: (!) 151 kg (332 lb 14.3 oz) IBW/kg (Calculated) : 50.1 TPN AdjBW (KG): 66.6 Body mass index is 60.89 kg/m.  Assessment:  54 YOF with recent R-hemicolectomy on 8/12 in the setting of a cecal mass - consistent with R-colon metastatic adenocarcinoma. 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 Pharmacy consulted to manage TPN for nutritional support in the setting of a colocutaneous fistula.    Glucose / Insulin: CBGs 74-110 on TPN, one CBG obtained while off TPN and found to be 69 (treated with dextrose). SSI d/c'ed  Electrolytes: Na 136 (on max Na), K 4.5, Cl 111 (on max Cl). Other electrolytes wnl.  Renal:  SCr < 1 stable, BUN WNL Hepatic: LFTs WNL, tbili / TG WNL, albumin 1.6 Intake / Output; MIVF: UOP 0.4 ml/kg/hr, LBM 9/28 GI Imaging:  8/24 CT: colocutaneous fistula, abd wall abscess, soft tissue edema, cellulitis vs nec fasciitis 8/25 Abd xray: diffuse abd wall emphysema, nonobstructive bowel gas pattern 9/4 CT: medial upper left thigh hematoma 9/17 CT: no evidence of communication between bowel and subQ tissue. Decrease volume extensive subcutaneous gas along the RIGHT abd wall and upper groin. GI Surgeries / Procedures:  8/12 at [facility]: R-hemicolectomy 8/24: Midline wound opened by Dr. Arnoldo Morale at bedside  Central access: PICC replaced 07/27/21 TPN start date: 07/19/21  Nutritional Goals (RD assessment on 9/22): 2300-2500 kcal, 140-160g AA, fluid >/= 2L per day  Current Nutrition:  TPN - not hung 9/25 due to PICC tip not being in the right place. PICC to be exchanged 9/26. Back to NPO 9/20 to allow fistula to close  Plan:  Continue to cycle TPN but increase duration back to 18 hrs due to hypoglycemia, continue q4h CBG monitoring and obtain extra POCT CBG at 1400 while off TPN - discussed with  RN.  Cycle TPN over 18 hours at goal volume of 2500 mL/24 hours, providing 150g AA 312g CHO and 71g ILE for a total of 2362 kCal, meeting 100% of estimated needs Electrolytes in TPN: Na 150 mEq/L, K 65mEq/L, Ca 77mEq/L, Mg 15mEq/L, Phos 84mmol/L - no changes. Change max Cl to 2:1. Add standard MVI and trace elements to TPN Standard TPN labs on Mon and Thurs, repeat BMP tomorrow  Cristela Felt, PharmD, BCPS Clinical Pharmacist 08/23/2021 7:45 AM

## 2021-08-23 NOTE — Progress Notes (Signed)
PROGRESS NOTE    Lauren [name]  WEX:[id number] DOB: [birth year] DOA: 07/18/2021 PCP: Pcp, No    Brief Narrative:  Mrs. Austria was admitted to the hospital with the working diagnosis of colocutaneous fistula with surrounding cellulitis/ abscess, complicated with septic shock.  Now she has persistent colocutaneous leak. Not able to transfer to SNF until off TPN.    75 year old female who was brought to the hospital due to hypotension.  Recent hospitalization 07/03/21-07/17/2021 for gastrointestinal bleeding due to cecal mass.  She had right hemicolectomy on 07/06/2021.  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   At home patient had nausea, abdominal pain, and right-sided back pain.  On her initial physical examination she was hypotensive, refractive intravenous fluids.  She was placed on vasopressors and broad-spectrum antibiotic therapy.   CT of the abdomen pelvis right hemicolectomy with a suture site in the mid abdomen.  Positive fistulous connection between the bowel adjacent to the suture into the overlying abdominal wall and to the skin surface through a pre-existing umbilical hernia.  The fistula is in communication with a focal collection with layering debris's measuring up to 7.4 cm in the abdominal wall consistent with abscess. Extensive soft tissue emphysema tracking through the body wall.  No free intraperitoneal air identified.   Admitted to [facility], placed on broad-spectrum antibiotic therapy including meropenem, vancomycin, antifungal therapy. General surgery was consulted. Patient transferred to [facility] for further care.  Vasopressors were weaned off successfully. TPN started on 8/25.   Transferred to [facility] 8/27.    Follow-up CT 9/17 with overall improvement and no signs of persistent communication within the bowel lumen and wound.   Patient slowly progressing but continue to have fistula drainage Patient remains NPO.    On TPN for  nutrition, has peripheral edema. She has been very frustrated because can not eat.  Fistula may take 8 to 9 weeks to close, until then it is recommended patient to stay NPO, otherwise fistula can become chronic.    Assessment & Plan:   Principal Problem:   Septic shock ([place]) Active Problems:   Hypokalemia   Morbid obesity with BMI of 50.0-59.9, adult (HCC)   Colon carcinoma (HCC)   Enterocutaneous fistula   Cellulitis   AKI (acute kidney injury) ([place])   Hyponatremia   Abdominal pain   Post operative colocutaneous fistula with cellulitis and abscess, complicated with sepsis (present on admission). New diagnosis of metastatic colon adenocarcinoma. Completed antibiotic therapy.  Pathology report with adenocarcinoma moderately differentiated with metastasis to one of 12 lymph nodes.    Patient with persistent leak, nutritional support with TPN.  Patient continue to ask for po water and soda.  She was explained by previous hospitalist and by myself today about the potential consequences if she were to take p.o. She has the option to resume po intake and have chronic leak requiring ostomy care.  Follow up with palliative care.   SNF not accepting patient on TPN, transition of care looking into possible LTAC.    2. Left thigh hematoma, with acute blood loss anemia.  sp 4 units PRBC transfusion during this hospitalization.  Hemoglobin is stable.  Monitor.  3. Atrial fibrillation with RVR.  On amiodarone and metoprolol for rate control.  Because of recent bleed will continue with enoxaparin at prophylactic dosing.    4. Chronic systolic heart failure. LV systolic function decreased to 30%, positive diastolic dysfunction as well.  Positive edema and lymphedema at the lower extremities  which is chronic. Continue with nutritional support. Avoid volume overload.    5. AKI, hyponatremia, hypokalemia, hypomagnesemia, hypochloremia.  Resolved   6. Obesity class 3, bilateral lower extremity  chronic lymphedema.  Coccyx and thigh pressure ulcer (resolved per nursing documentation 09.25) PT and OT recommends SNF.  TOC on board.  No SNF will take her unless she is off of TPN.  TOC working on finding LTAC.  Per patient, her swelling/edema is significantly worse.  This is verified by the nurse at the bedside as well.  Advised patient to keep her extremities elevated and we will also start compression stockings.  We will also resume torsemide, she takes 20 mg p.o. daily, will resume at 20 mg p.o. twice daily.   7. Metabolic encephalopathy with delirium, anxiety and depression,  Encephalopathy resolved.  Continue with citalopram and PRN lorazepam. Pain control with hydromorphone and oxycodone.    8. Hypothyroid. Continue with levothyroxine    9. COVID 19 infection, no viral pneumonia, now off isolation    Status is: Inpatient  Remains inpatient appropriate because:Inpatient level of care appropriate due to severity of illness  Dispo: The patient is from: Home              Anticipated d/c is to: SNF vs LTAC              Patient currently is medically stable to d/c.   Difficult to place patient YES  DVT prophylaxis: Enoxaparin   Code Status:    DNR   Family Communication:  No family at the bedside      Nutrition Status: Nutrition Problem: Inadequate oral intake Etiology: altered GI function, acute illness Signs/Symptoms: NPO status Interventions: TPN     Consultants:  Surgery    Subjective: Seen and examined.  No complaints.  Objective: Vitals:   08/22/21 1925 08/22/21 2211 08/23/21 0406 08/23/21 0836  BP: (!) 95/40 (!) 144/91 (!) 122/58 126/77  Pulse: 90 100 88 (!) 105  Resp: 17 18 19 16   Temp: 97.6 F (36.4 C)  98 F (36.7 C) 97.9 F (36.6 C)  TempSrc: Oral  Oral Oral  SpO2: 94% 98% 99% 95%  Weight:      Height:        Intake/Output Summary (Last 24 hours) at 08/23/2021 1235 Last data filed at 08/23/2021 0400 Gross per 24 hour  Intake 0 ml  Output  1600 ml  Net -1600 ml    Filed Weights   08/15/21 0530 08/16/21 0343 08/18/21 0410  Weight: (!) 143 kg (!) 146.7 kg (!) 151 kg    Examination:   General exam: Appears calm and comfortable, obese Respiratory system: Clear to auscultation. Respiratory effort normal. Cardiovascular system: S1 & S2 heard, RRR. No JVD, murmurs, rubs, gallops or clicks.  Massive bilateral lymphedema with pitting edema. Gastrointestinal system: Abdomen is nondistended, soft and nontender. No organomegaly or masses felt. Normal bowel sounds heard. Central nervous system: Alert and oriented. No focal neurological deficits. Extremities: Symmetric 5 x 5 power. Skin: No rashes, lesions or ulcers.  Psychiatry: Judgement and insight appear poor   Data Reviewed: I have personally reviewed following labs and imaging studies  CBC: Recent Labs  Lab 08/22/21 0149  WBC 5.9  HGB 8.8*  HCT 30.2*  MCV 93.5  PLT 676    Basic Metabolic Panel: Recent Labs  Lab 08/18/21 1051 08/19/21 1605 08/20/21 0626 08/21/21 0905 08/22/21 0149 08/23/21 0448  NA 137  --  136 134* 134* 136  K 4.6  --  4.5 4.4 4.5 4.5  CL 110  --  107 107 109 111  CO2 25  --  25 24 22 22   GLUCOSE 101*  --  71 84 103* 98  BUN 23  --  18 18 20 23   CREATININE 0.45 0.40* 0.52 0.53 0.46 0.50  CALCIUM 8.2*  --  8.0* 8.0* 7.7* 7.8*  MG  --   --  1.7 1.7 2.0 2.0  PHOS  --   --  3.1 3.7 3.7 3.5    GFR: Estimated Creatinine Clearance: 90.8 mL/min (by C-G formula based on SCr of 0.5 mg/dL). Liver Function Tests: Recent Labs  Lab 08/20/21 0626 08/23/21 0448  AST 51* 32  ALT 32 27  ALKPHOS 119 116  BILITOT 0.7 0.3  PROT 5.8* 5.3*  ALBUMIN 1.7* 1.6*    No results for input(s): LIPASE, AMYLASE in the last 168 hours. No results for input(s): AMMONIA in the last 168 hours. Coagulation Profile: No results for input(s): INR, PROTIME in the last 168 hours. Cardiac Enzymes: No results for input(s): CKTOTAL, CKMB, CKMBINDEX, TROPONINI in  the last 168 hours. BNP (last 3 results) No results for input(s): PROBNP in the last 8760 hours. HbA1C: No results for input(s): HGBA1C in the last 72 hours. CBG: Recent Labs  Lab 08/22/21 1600 08/22/21 1928 08/23/21 0013 08/23/21 0638 08/23/21 1136  GLUCAP 69* 74 111* 110* 79    Lipid Profile: No results for input(s): CHOL, HDL, LDLCALC, TRIG, CHOLHDL, LDLDIRECT in the last 72 hours.  Thyroid Function Tests: No results for input(s): TSH, T4TOTAL, FREET4, T3FREE, THYROIDAB in the last 72 hours. Anemia Panel: No results for input(s): VITAMINB12, FOLATE, FERRITIN, TIBC, IRON, RETICCTPCT in the last 72 hours.    Radiology Studies: I have reviewed all of the imaging during this hospital visit personally     Scheduled Meds:  amiodarone  200 mg Oral Daily   Chlorhexidine Gluconate Cloth  6 each Topical Daily   citalopram  20 mg Oral Daily   enoxaparin (LOVENOX) injection  0.5 mg/kg Subcutaneous Q24H   levothyroxine  150 mcg Oral Q0600   loratadine  10 mg Oral Daily   mouth rinse  15 mL Mouth Rinse BID   metoprolol tartrate  12.5 mg Oral BID   pantoprazole  40 mg Oral Q0600   sodium chloride flush  10-40 mL Intracatheter Q12H   sodium chloride flush  10-40 mL Intracatheter Q12H   Continuous Infusions:  TPN CYCLIC-ADULT (ION)       LOS: 36 days   Total time spent in minutes: 30 minutes Darliss Cheney, MD TRH/hospitalist

## 2021-08-23 NOTE — Progress Notes (Signed)
Physical Therapy Treatment Patient Details Name: Lauren [name] MRN: [id number] DOB: May 04, 1946 Today's Date: 08/23/2021   History of Present Illness Pt is a 75 y.o. female admitted from [facility] on 5/57/32 with hypotension and Covid (+). Pt with colocutaneous fistula, Afib and new CHf since admission. Pt also with abdominal wall cellulitis and left thigh hematoma requiring blood transfusion. Of note, recent admission (8/9-8/23) to [facility] with partially obstructing tumor in the ascending colon s/p R hemicolectomy 07/06/21. PMH includes A fib, morbid obesity, HTN, lymphedema, hypothyroidism.    PT Comments    Pt making slow progress toward goals.  Needs encouragement, but works hard within session given motivational cues. Emphasis on wam up U and LE exercise,  transitions to EOB, sitting balance EOB, sit to stand x3 with pivotal then sidesteps to position well in front of the recliner.   Recommendations for follow up therapy are one component of a multi-disciplinary discharge planning process, led by the attending physician.  Recommendations may be updated based on patient status, additional functional criteria and insurance authorization.  Follow Up Recommendations  SNF;Supervision/Assistance - 24 hour     Equipment Recommendations  Other (comment);None recommended by PT (TBD next venue)    Recommendations for Other Services       Precautions / Restrictions Precautions Precautions: Fall Precaution Comments: BLE lymphedema, ostomy, incontinence     Mobility  Bed Mobility Overal bed mobility: Needs Assistance Bed Mobility: Rolling;Supine to Sit;Sit to Supine Rolling: Max assist   Supine to sit: Mod assist;+2 for physical assistance     General bed mobility comments: pt worked hard to assist with roll by initiating roll bringing each leg over.  Still needed significant truncal/LE assist    Transfers Overall transfer level: Needs assistance Equipment used: 2 person hand held  assist Transfers: Sit to/from [employer] Transfers Sit to Stand: Mod assist;+2 safety/equipment;+2 physical assistance Stand pivot transfers: Mod assist;+2 physical assistance;+2 safety/equipment       General transfer comment: cues for sequencing, how pt should assist.  face to face assist forward and with boost.  Pt maintained a significant amount of w/bearing herself.  Assisted with w/shift so pt could move her feet, pt unable to lift leg to flex knees well.  Ambulation/Gait           Gait velocity interpretation: <1.31 ft/sec, indicative of household ambulator General Gait Details: pivotal steps and side stepping to center at edge of the chair with face to face stability and w/shift assist   Stairs             Wheelchair Mobility    Modified Rankin (Stroke Patients Only)       Balance   Sitting-balance support: Feet supported;Feet unsupported;No upper extremity supported;Single extremity supported Sitting balance-Leahy Scale: Fair Sitting balance - Comments: on eob patient had complaints of LLE pain from bedrails   Standing balance support: Bilateral upper extremity supported Standing balance-Leahy Scale: Poor Standing balance comment: reliant on external support                            Cognition Arousal/Alertness: Awake/alert Behavior During Therapy: Flat affect;WFL for tasks assessed/performed Overall Cognitive Status: Within Functional Limits for tasks assessed                                        Exercises General Exercises -  Lower Extremity Hip ABduction/ADduction: AAROM;Strengthening;Both;10 reps;Supine;Other (comment) (then leg left to hang off the bed for stretch) Hip Flexion/Marching: AAROM;10 reps;Supine Other Exercises Other Exercises: SLR for ham string stretch    General Comments General comments (skin integrity, edema, etc.): pt was incontinent of stool and bed mobility incorporated in the clean up.   VSS in general      Pertinent Vitals/Pain Pain Assessment: Faces Faces Pain Scale: Hurts even more Pain Location: L LE Pain Descriptors / Indicators: Discomfort;Grimacing;Guarding Pain Intervention(s): Monitored during session;Repositioned    Home Living                      Prior Function            PT Goals (current goals can now be found in the care plan section) Acute Rehab PT Goals Patient Stated Goal: to get better PT Goal Formulation: With patient Time For Goal Achievement: 08/28/21 Potential to Achieve Goals: Fair Progress towards PT goals: Progressing toward goals    Frequency    Min 2X/week      PT Plan Current plan remains appropriate    Co-evaluation              AM-PAC PT "6 Clicks" Mobility   Outcome Measure  Help needed turning from your back to your side while in a flat bed without using bedrails?: A Lot Help needed moving from lying on your back to sitting on the side of a flat bed without using bedrails?: A Lot Help needed moving to and from a bed to a chair (including a wheelchair)?: A Lot Help needed standing up from a chair using your arms (e.g., wheelchair or bedside chair)?: A Lot Help needed to walk in hospital room?: Total Help needed climbing 3-5 steps with a railing? : Total 6 Click Score: 10    End of Session   Activity Tolerance: Patient limited by fatigue;Patient tolerated treatment well Patient left: in chair;with call bell/phone within reach;with chair alarm set;Other (comment) (with lift pad under pt.) Nurse Communication: Mobility status;Need for lift equipment PT Visit Diagnosis: Other abnormalities of gait and mobility (R26.89);Muscle weakness (generalized) (M62.81);Difficulty in walking, not elsewhere classified (R26.2);Other (comment) (lymphadema)     Time: 3893-7342 PT Time Calculation (min) (ACUTE ONLY): 48 min  Charges:  $Therapeutic Exercise: 8-22 mins $Therapeutic Activity: 23-37 mins                      08/23/2021  Ginger Carne., PT Acute Rehabilitation Services [phone]  (pager) [phone]  (office)   Tessie Fass Mulan Adan 08/23/2021, 2:51 PM

## 2021-08-23 NOTE — Plan of Care (Signed)
  Problem: Respiratory: Goal: Ability to maintain adequate ventilation will improve Outcome: Progressing   Problem: Education: Goal: Knowledge of General Education information will improve Description: Including pain rating scale, medication(s)/side effects and non-pharmacologic comfort measures Outcome: Progressing   

## 2021-08-24 DIAGNOSIS — R6521 Severe sepsis with septic shock: Secondary | ICD-10-CM | POA: Diagnosis not present

## 2021-08-24 DIAGNOSIS — A419 Sepsis, unspecified organism: Secondary | ICD-10-CM | POA: Diagnosis not present

## 2021-08-24 LAB — GLUCOSE, CAPILLARY
Glucose-Capillary: 107 mg/dL — ABNORMAL HIGH (ref 70–99)
Glucose-Capillary: 82 mg/dL (ref 70–99)
Glucose-Capillary: 84 mg/dL (ref 70–99)
Glucose-Capillary: 86 mg/dL (ref 70–99)
Glucose-Capillary: 91 mg/dL (ref 70–99)
Glucose-Capillary: 91 mg/dL (ref 70–99)
Glucose-Capillary: 92 mg/dL (ref 70–99)

## 2021-08-24 LAB — BASIC METABOLIC PANEL
Anion gap: 5 (ref 5–15)
BUN: 25 mg/dL — ABNORMAL HIGH (ref 8–23)
CO2: 21 mmol/L — ABNORMAL LOW (ref 22–32)
Calcium: 7.9 mg/dL — ABNORMAL LOW (ref 8.9–10.3)
Chloride: 112 mmol/L — ABNORMAL HIGH (ref 98–111)
Creatinine, Ser: 0.56 mg/dL (ref 0.44–1.00)
GFR, Estimated: >60 mL/min (ref >60)
Glucose, Bld: 89 mg/dL (ref 70–99)
Potassium: 4.1 mmol/L (ref 3.5–5.1)
Sodium: 138 mmol/L (ref 135–145)

## 2021-08-24 MED ORDER — TRAVASOL 10 % IV SOLN
INTRAVENOUS | Status: AC
  Filled 2021-08-24: qty 1500

## 2021-08-24 NOTE — Progress Notes (Signed)
Per pharmacist increase to 147. Informed IV team

## 2021-08-24 NOTE — Progress Notes (Signed)
Progress Note     Subjective: Patient reports she is miserable not being able to drink anything other than a few sips with medications. She understands that drinking may cause her fistula not to close.   Objective: Vital signs in last 24 hours: Temp:  [98 F (36.7 C)-98.4 F (36.9 C)] 98.4 F (36.9 C) (09/30 0900) Pulse Rate:  [79-88] 88 (09/30 0900) Resp:  [16-18] 16 (09/30 0900) BP: (105-111)/(56-63) 105/56 (09/30 0900) SpO2:  [96 %-97 %] 96 % (09/30 0900) Last BM Date: 08/23/21  Intake/Output from previous day: 09/29 0701 - 09/30 0700 In: -  Out: 4630 [Urine:4600; Drains:30] Intake/Output this shift: No intake/output data recorded.  PE: Gen:  Alert, NAD, pleasant Pulm:  rate and effort normal Abd: obese, soft, ND, NT, +BS, eakins pouch w/ small-mod amount of tan output and air in bag   Lab Results:  Recent Labs    08/22/21 0149  WBC 5.9  HGB 8.8*  HCT 30.2*  PLT 201   BMET Recent Labs    08/23/21 0448 08/24/21 0220  NA 136 138  K 4.5 4.1  CL 111 112*  CO2 22 21*  GLUCOSE 98 89  BUN 23 25*  CREATININE 0.50 0.56  CALCIUM 7.8* 7.9*   PT/INR No results for input(s): LABPROT, INR in the last 72 hours. CMP     Component Value Date/Time   NA 138 08/24/2021 0220   K 4.1 08/24/2021 0220   CL 112 (H) 08/24/2021 0220   CO2 21 (L) 08/24/2021 0220   GLUCOSE 89 08/24/2021 0220   BUN 25 (H) 08/24/2021 0220   CREATININE 0.56 08/24/2021 0220   CALCIUM 7.9 (L) 08/24/2021 0220   PROT 5.3 (L) 08/23/2021 0448   ALBUMIN 1.6 (L) 08/23/2021 0448   AST 32 08/23/2021 0448   ALT 27 08/23/2021 0448   ALKPHOS 116 08/23/2021 0448   BILITOT 0.3 08/23/2021 0448   GFRNONAA >60 08/24/2021 0220   GFRAA >90 03/28/2014 0435   Lipase     Component Value Date/Time   LIPASE 25 03/27/2014 0600       Studies/Results: No results found.  Anti-infectives: Anti-infectives (From admission, onward)    Start     Dose/Rate Route Frequency Ordered Stop   08/12/21 0415   clindamycin (CLEOCIN) IVPB 600 mg        600 mg 100 mL/hr over 30 Minutes Intravenous  Once 08/12/21 0317 08/12/21 0434   07/20/21 1000  remdesivir 100 mg in sodium chloride 0.9 % 100 mL IVPB       See Hyperspace for full Linked Orders Report.   100 mg 200 mL/hr over 30 Minutes Intravenous Daily 07/19/21 1040 07/21/21 1148   07/20/21 0200  vancomycin (VANCOREADY) IVPB 750 mg/150 mL  Status:  Discontinued        750 mg 150 mL/hr over 60 Minutes Intravenous Every 24 hours 07/19/21 1036 07/19/21 1449   07/19/21 2300  anidulafungin (ERAXIS) 100 mg in sodium chloride 0.9 % 100 mL IVPB  Status:  Discontinued        100 mg 78 mL/hr over 100 Minutes Intravenous Every 24 hours 07/18/21 2107 07/19/21 1449   07/19/21 1600  vancomycin (VANCOREADY) IVPB 750 mg/150 mL  Status:  Discontinued        750 mg 150 mL/hr over 60 Minutes Intravenous Every 24 hours 07/19/21 0919 07/19/21 1016   07/19/21 1600  vancomycin (VANCOREADY) IVPB 750 mg/150 mL  Status:  Discontinued        750  mg 150 mL/hr over 60 Minutes Intravenous Every 24 hours 07/19/21 1016 07/19/21 1036   07/19/21 1400  piperacillin-tazobactam (ZOSYN) IVPB 3.375 g  Status:  Discontinued        3.375 g 12.5 mL/hr over 240 Minutes Intravenous Every 8 hours 07/19/21 1016 07/31/21 1501   07/19/21 1130  remdesivir 200 mg in sodium chloride 0.9% 250 mL IVPB       See Hyperspace for full Linked Orders Report.   200 mg 580 mL/hr over 30 Minutes Intravenous Once 07/19/21 1040 07/19/21 1241   07/19/21 0100  metroNIDAZOLE (FLAGYL) IVPB 500 mg  Status:  Discontinued        500 mg 100 mL/hr over 60 Minutes Intravenous Every 12 hours 07/18/21 1203 07/18/21 1534   07/18/21 2200  ceFEPIme (MAXIPIME) 2 g in sodium chloride 0.9 % 100 mL IVPB  Status:  Discontinued        2 g 200 mL/hr over 30 Minutes Intravenous Every 12 hours 07/18/21 1156 07/18/21 1203   07/18/21 2200  meropenem (MERREM) 2 g in sodium chloride 0.9 % 100 mL IVPB  Status:  Discontinued         2 g 200 mL/hr over 30 Minutes Intravenous Every 12 hours 07/18/21 2104 07/19/21 1011   07/18/21 2200  anidulafungin (ERAXIS) 200 mg in sodium chloride 0.9 % 200 mL IVPB        200 mg 78 mL/hr over 200 Minutes Intravenous  Once 07/18/21 2108 07/19/21 0419   07/18/21 1800  clindamycin (CLEOCIN) IVPB 900 mg  Status:  Discontinued        900 mg 100 mL/hr over 30 Minutes Intravenous Every 8 hours 07/18/21 1534 07/18/21 2103   07/18/21 1600  vancomycin (VANCOREADY) IVPB 2000 mg/400 mL  Status:  Discontinued        2,000 mg 200 mL/hr over 120 Minutes Intravenous Every 24 hours 07/18/21 1544 07/19/21 0919   07/18/21 1545  piperacillin-tazobactam (ZOSYN) IVPB 3.375 g  Status:  Discontinued        3.375 g 12.5 mL/hr over 240 Minutes Intravenous Every 8 hours 07/18/21 1544 07/18/21 2104   07/18/21 1300  cefTRIAXone (ROCEPHIN) 2 g in sodium chloride 0.9 % 100 mL IVPB  Status:  Discontinued        2 g 200 mL/hr over 30 Minutes Intravenous Every 24 hours 07/18/21 1203 07/18/21 1628   07/18/21 1200  ceFEPIme (MAXIPIME) 2 g in sodium chloride 0.9 % 100 mL IVPB  Status:  Discontinued        2 g 200 mL/hr over 30 Minutes Intravenous Every 12 hours 07/18/21 1155 07/18/21 1156   07/18/21 0930  ceFEPIme (MAXIPIME) 2 g in sodium chloride 0.9 % 100 mL IVPB        2 g 200 mL/hr over 30 Minutes Intravenous  Once 07/18/21 0918 07/18/21 1135   07/18/21 0930  metroNIDAZOLE (FLAGYL) IVPB 500 mg  Status:  Discontinued        500 mg 100 mL/hr over 60 Minutes Intravenous  Once 07/18/21 7322 07/18/21 1534        Assessment/Plan 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 Arnoldo Morale at [facility] on 8/12 - path w/ adenocarcinoma. Oncology has seen on 9/5 - Midline wound opened by Dr. Arnoldo Morale at bedside 8/24 which had stool decompressing through CC fistula  - Currently has eakin's pouch - 30 cc out recorded for yesterday day shift, none recorded overnight but would estimate around 30-40 cc  in pouch this AM - CT 9/17 w/ overall improvement and no sign of persistent communication between bowel lumen and wound  - Although imaging reassuring, given air and output still in eakin's pouch I am concerned the CC fistula has not completely closed. Made NPO again on 9/20. Cont TPN - Appreciate palliative assistance with GOC, last seen 9/28 - patient now reports that she can't take not having any more to drink and wants to be able to drink more and understands that this may prevent fistula from closing  - patient is stable for discharge to SNF on TPN from a surgical standpoint and can follow up with Dr. Arnoldo Morale. Typically would give several weeks of NPO and TPN to try to allow fistula to close on its own but if patient does not want to comply with this then she could eat and just continue Eakins pouch. Will defer outpatient management of this to Dr. Arnoldo Morale   FEN - NPO. TPN. IVF per primary VTE - SCDs, Lovenox, per primary  ID - None currently.    COVID + A. Fib on Xarelto at home  ABL anemia - FOBT positive 8/24 Hypothyroidism Severe protein calorie malnutrition - albumin 1.3. Pre-alb <5. On TNA  LOS: 37 days    Norm Parcel, [facility] 08/24/2021, 9:21 AM Please see Amion for pager number during day hours 7:00am-4:30pm

## 2021-08-24 NOTE — Progress Notes (Signed)
PROGRESS NOTE    Lauren [name]  TGG:[id number] DOB: 18-Feb-1946 DOA: 07/18/2021 PCP: Pcp, No    Brief Narrative:  Lauren [name] was admitted to the hospital with the working diagnosis of colocutaneous fistula with surrounding cellulitis/ abscess, complicated with septic shock.  Now she has persistent colocutaneous leak. Not able to transfer to SNF until off TPN.    75 year old female who was brought to the hospital due to hypotension.  Recent hospitalization 07/03/21-07/17/2021 for gastrointestinal bleeding due to cecal mass.  She had right hemicolectomy on 07/06/2021.  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   At home patient had nausea, abdominal pain, and right-sided back pain.  On her initial physical examination she was hypotensive, refractive intravenous fluids.  She was placed on vasopressors and broad-spectrum antibiotic therapy.   CT of the abdomen pelvis right hemicolectomy with a suture site in the mid abdomen.  Positive fistulous connection between the bowel adjacent to the suture into the overlying abdominal wall and to the skin surface through a pre-existing umbilical hernia.  The fistula is in communication with a focal collection with layering debris's measuring up to 7.4 cm in the abdominal wall consistent with abscess. Extensive soft tissue emphysema tracking through the body wall.  No free intraperitoneal air identified.   Admitted to [facility] intensive care unit, placed on broad-spectrum antibiotic therapy including meropenem, vancomycin, antifungal therapy. General surgery was consulted. Patient transferred to [facility] for further care.  Vasopressors were weaned off successfully. TPN started on 8/25.   Transferred to [facility] 8/27.    Follow-up CT 9/17 with overall improvement and no signs of persistent communication within the bowel lumen and wound.   Patient slowly progressing but continue to have fistula drainage Patient remains NPO.    On TPN for  nutrition, has peripheral edema. She has been very frustrated because can not eat.  Fistula may take 8 to 9 weeks to close, until then it is recommended patient to stay NPO, otherwise fistula can become chronic.    Assessment & Plan:   Principal Problem:   Septic shock ([place]) Active Problems:   Hypokalemia   Morbid obesity with BMI of 50.0-59.9, adult (HCC)   Colon carcinoma (HCC)   Enterocutaneous fistula   Cellulitis   AKI (acute kidney injury) ([place])   Hyponatremia   Abdominal pain   Post operative colocutaneous fistula with cellulitis and abscess, complicated with sepsis (present on admission). New diagnosis of metastatic colon adenocarcinoma. Completed antibiotic therapy.  Pathology report with adenocarcinoma moderately differentiated with metastasis to one of 12 lymph nodes.    Patient with persistent leak, nutritional support with TPN.  Patient continue to ask for po water and soda.  She was explained by previous hospitalist and by myself today about the potential consequences of fistula not closing if she were to take p.o. She has the option to resume po intake and have chronic leak requiring ostomy care.  She keeps going back and forth with requesting to let her drink water at least. Follow up with palliative care.   SNF not accepting patient on TPN, transition of care looking into possible LTAC.    2. Left thigh hematoma, with acute blood loss anemia.  sp 4 units PRBC transfusion during this hospitalization.  Hemoglobin is stable.  Monitor.  3. Atrial fibrillation with RVR.  On amiodarone and metoprolol for rate control.  Because of recent bleed will continue with enoxaparin at prophylactic dosing.    4. Chronic systolic heart failure. LV  systolic function decreased to 30%, positive diastolic dysfunction as well.  Positive edema and lymphedema at the lower extremities which is chronic. Continue with nutritional support. Avoid volume overload.    5. AKI, hyponatremia,  hypokalemia, hypomagnesemia, hypochloremia.  Resolved   6. Obesity class 3, bilateral lower extremity chronic lymphedema.  Coccyx and thigh pressure ulcer (resolved per nursing documentation 09.25) PT and OT recommends SNF.  TOC on board.  No SNF will take her unless she is off of TPN.  TOC working on finding LTAC.  Per patient, her swelling/edema is significantly worse.  This is verified by the nurse at the bedside as well.  Advised patient to keep her extremities elevated and we will also start compression stockings.  We will also resume torsemide, she takes 20 mg p.o. daily, will resume at 20 mg p.o. twice daily.   7. Metabolic encephalopathy with delirium, anxiety and depression,  Encephalopathy resolved.  Continue with citalopram and PRN lorazepam. Pain control with hydromorphone and oxycodone.    8. Hypothyroid. Continue with levothyroxine    9. COVID 19 infection, no viral pneumonia, now off isolation    Status is: Inpatient  Remains inpatient appropriate because:Inpatient level of care appropriate due to severity of illness  Dispo: The patient is from: Home              Anticipated d/c is to: SNF vs LTAC              Patient currently is medically stable to d/c.   Difficult to place patient YES  DVT prophylaxis: Enoxaparin   Code Status:    DNR   Family Communication:  No family at the bedside      Nutrition Status: Nutrition Problem: Inadequate oral intake Etiology: altered GI function, acute illness Signs/Symptoms: NPO status Interventions: TPN     Consultants:  Surgery    Subjective: Seen and examined.  She has no complaints but she is once again requesting to let her drink water.  Objective: Vitals:   08/23/21 0406 08/23/21 0836 08/23/21 2041 08/24/21 0900  BP: (!) 122/58 126/77 111/63 (!) 105/56  Pulse: 88 (!) 105 79 88  Resp: 19 16 18 16   Temp: 98 F (36.7 C) 97.9 F (36.6 C) 98 F (36.7 C) 98.4 F (36.9 C)  TempSrc: Oral Oral Oral Oral  SpO2:  99% 95% 97% 96%  Weight:      Height:        Intake/Output Summary (Last 24 hours) at 08/24/2021 1033 Last data filed at 08/24/2021 0653 Gross per 24 hour  Intake --  Output 4600 ml  Net -4600 ml    Filed Weights   08/15/21 0530 08/16/21 0343 08/18/21 0410  Weight: (!) 143 kg (!) 146.7 kg (!) 151 kg    Examination:   General exam: Appears calm and comfortable, obese Respiratory system: Clear to auscultation. Respiratory effort normal. Cardiovascular system: S1 & S2 heard, RRR. No JVD, murmurs, rubs, gallops or clicks. No pedal edema. Gastrointestinal system: Abdomen is nondistended, soft and nontender. No organomegaly or masses felt. Normal bowel sounds heard. Central nervous system: Alert and oriented. No focal neurological deficits. Extremities: Symmetric 5 x 5 power. Skin: No rashes, lesions or ulcers.  Psychiatry: Judgement and insight appear poor   Data Reviewed: I have personally reviewed following labs and imaging studies  CBC: Recent Labs  Lab 08/22/21 0149  WBC 5.9  HGB 8.8*  HCT 30.2*  MCV 93.5  PLT 354    Basic Metabolic Panel:  Recent Labs  Lab 08/20/21 0626 08/21/21 0905 08/22/21 0149 08/23/21 0448 08/24/21 0220  NA 136 134* 134* 136 138  K 4.5 4.4 4.5 4.5 4.1  CL 107 107 109 111 112*  CO2 25 24 22 22  21*  GLUCOSE 71 84 103* 98 89  BUN 18 18 20 23  25*  CREATININE 0.52 0.53 0.46 0.50 0.56  CALCIUM 8.0* 8.0* 7.7* 7.8* 7.9*  MG 1.7 1.7 2.0 2.0  --   PHOS 3.1 3.7 3.7 3.5  --     GFR: Estimated Creatinine Clearance: 90.8 mL/min (by C-G formula based on SCr of 0.56 mg/dL). Liver Function Tests: Recent Labs  Lab 08/20/21 0626 08/23/21 0448  AST 51* 32  ALT 32 27  ALKPHOS 119 116  BILITOT 0.7 0.3  PROT 5.8* 5.3*  ALBUMIN 1.7* 1.6*    No results for input(s): LIPASE, AMYLASE in the last 168 hours. No results for input(s): AMMONIA in the last 168 hours. Coagulation Profile: No results for input(s): INR, PROTIME in the last 168  hours. Cardiac Enzymes: No results for input(s): CKTOTAL, CKMB, CKMBINDEX, TROPONINI in the last 168 hours. BNP (last 3 results) No results for input(s): PROBNP in the last 8760 hours. HbA1C: No results for input(s): HGBA1C in the last 72 hours. CBG: Recent Labs  Lab 08/23/21 1628 08/23/21 2031 08/23/21 2353 08/24/21 0423 08/24/21 0806  GLUCAP 77 97 92 92 91    Lipid Profile: No results for input(s): CHOL, HDL, LDLCALC, TRIG, CHOLHDL, LDLDIRECT in the last 72 hours.  Thyroid Function Tests: No results for input(s): TSH, T4TOTAL, FREET4, T3FREE, THYROIDAB in the last 72 hours. Anemia Panel: No results for input(s): VITAMINB12, FOLATE, FERRITIN, TIBC, IRON, RETICCTPCT in the last 72 hours.    Radiology Studies: I have reviewed all of the imaging during this hospital visit personally     Scheduled Meds:  amiodarone  200 mg Oral Daily   Chlorhexidine Gluconate Cloth  6 each Topical Daily   citalopram  20 mg Oral Daily   enoxaparin (LOVENOX) injection  0.5 mg/kg Subcutaneous Q24H   levothyroxine  150 mcg Oral Q0600   loratadine  10 mg Oral Daily   mouth rinse  15 mL Mouth Rinse BID   metoprolol tartrate  12.5 mg Oral BID   pantoprazole  40 mg Oral Q0600   sodium chloride flush  10-40 mL Intracatheter Q12H   sodium chloride flush  10-40 mL Intracatheter Q12H   torsemide  20 mg Oral BID   Continuous Infusions:  TPN CYCLIC-ADULT (ION) 74 mL/hr at 93/90/30 0923   TPN CYCLIC-ADULT (ION)       LOS: 37 days   Total time spent in minutes: 28 minutes Darliss Cheney, MD [facility]/hospitalist

## 2021-08-24 NOTE — Progress Notes (Signed)
PHARMACY - TOTAL PARENTERAL NUTRITION CONSULT NOTE  Indication: Fistula  Patient Measurements: Height: 5\' 2"  (157.5 cm) Weight: (!) 151 kg (332 lb 14.3 oz) IBW/kg (Calculated) : 50.1 TPN AdjBW (KG): 66.6 Body mass index is 60.89 kg/m.  Assessment:  12 YOF with recent R-hemicolectomy on 8/12 in the setting of a cecal mass - consistent with R-colon metastatic adenocarcinoma. 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 Pharmacy consulted to manage TPN for nutritional support in the setting of a colocutaneous fistula.    Glucose / Insulin: CBGs 89-97 on TPN. Two CBGs obtained while off TPN found to be 75 and 77. No incidences of hypoglycemia requiring treatment. SSI d/c'ed  Electrolytes: Na 138, K 4.1, Cl 112 (changed from max Cl to 2:1 on 9/29). Other electrolytes wnl.  Renal:  SCr < 1 stable, BUN 25. Started on torsemide po on 9/29. Hepatic: LFTs WNL, tbili / TG WNL, albumin 1.6 Intake / Output; MIVF: UOP 1.4ml/kg/hr, LBM 9/28 GI Imaging:  8/24 CT: colocutaneous fistula, abd wall abscess, soft tissue edema, cellulitis vs nec fasciitis 8/25 Abd xray: diffuse abd wall emphysema, nonobstructive bowel gas pattern 9/4 CT: medial upper left thigh hematoma 9/17 CT: no evidence of communication between bowel and subQ tissue. Decrease volume extensive subcutaneous gas along the RIGHT abd wall and upper groin. GI Surgeries / Procedures:  8/12 at [facility]: R-hemicolectomy 8/24: Midline wound opened by Dr. Arnoldo Morale at bedside  Central access: PICC replaced 07/27/21 TPN start date: 07/19/21  Nutritional Goals (RD assessment on 9/29): 2300-2500 kcal, 140-160g AA, fluid >/= 2L per day  Current Nutrition:  TPN - not hung 9/25 due to PICC tip not being in the right place. PICC to be exchanged 9/26. Back to NPO 9/20 to allow fistula to close  Plan:  Continue to cycle TPN over 18 hrs due to previous hypoglycemia, continue q4h CBG monitoring and  obtain extra POCT CBG at 1400 while off TPN - discussed with RN. If continues to remain stable will consider 16 hr cycle tomorrow.  Cycle TPN over 18 hours at goal volume of 2500 mL/24 hours, providing 150g AA 312g CHO and 71g ILE for a total of 2362 kCal, meeting 100% of estimated needs Electrolytes in TPN: Na 140 mEq/L, K 63mEq/L, Ca 37mEq/L, Mg 19mEq/L, Phos 85mmol/L. Change Cl:Ac to 1:1.  Add standard MVI and trace elements to TPN Standard TPN labs on Mon and Thurs, repeat electrolytes tomorrow  Lauren [name], PharmD, BCPS Clinical Pharmacist 08/24/2021 7:20 AM

## 2021-08-25 DIAGNOSIS — A419 Sepsis, unspecified organism: Secondary | ICD-10-CM | POA: Diagnosis not present

## 2021-08-25 DIAGNOSIS — R6521 Severe sepsis with septic shock: Secondary | ICD-10-CM | POA: Diagnosis not present

## 2021-08-25 LAB — GLUCOSE, CAPILLARY
Glucose-Capillary: 98 mg/dL (ref 70–99)
Glucose-Capillary: 90 mg/dL (ref 70–99)
Glucose-Capillary: 97 mg/dL (ref 70–99)
Glucose-Capillary: 79 mg/dL (ref 70–99)
Glucose-Capillary: 83 mg/dL (ref 70–99)

## 2021-08-25 LAB — BASIC METABOLIC PANEL
Anion gap: 6 (ref 5–15)
BUN: 32 mg/dL — ABNORMAL HIGH (ref 8–23)
CO2: 27 mmol/L (ref 22–32)
Calcium: 8.1 mg/dL — ABNORMAL LOW (ref 8.9–10.3)
Chloride: 108 mmol/L (ref 98–111)
Creatinine, Ser: 0.58 mg/dL (ref 0.44–1.00)
GFR, Estimated: >60 mL/min (ref >60)
Glucose, Bld: 88 mg/dL (ref 70–99)
Potassium: 3.8 mmol/L (ref 3.5–5.1)
Sodium: 141 mmol/L (ref 135–145)

## 2021-08-25 LAB — PHOSPHORUS: Phosphorus: 4.9 mg/dL — ABNORMAL HIGH (ref 2.5–4.6)

## 2021-08-25 LAB — MAGNESIUM: Magnesium: 2 mg/dL (ref 1.7–2.4)

## 2021-08-25 MED ORDER — TRAVASOL 10 % IV SOLN
INTRAVENOUS | Status: AC
  Filled 2021-08-25: qty 1500

## 2021-08-25 NOTE — Progress Notes (Signed)
PROGRESS NOTE    YAA DONNELLAN  QQP:[id number] DOB: May 28, 1946 DOA: 07/18/2021 PCP: Pcp, No    Brief Narrative:  Mrs. Pearman was admitted to the hospital with the working diagnosis of colocutaneous fistula with surrounding cellulitis/ abscess, complicated with septic shock.  Now she has persistent colocutaneous leak. Not able to transfer to SNF until off TPN.    75 year old female who was brought to the hospital due to hypotension.  Recent hospitalization 07/03/21-07/17/2021 for gastrointestinal bleeding due to cecal mass.  She had right hemicolectomy on 07/06/2021.  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   At home patient had nausea, abdominal pain, and right-sided back pain.  On her initial physical examination she was hypotensive, refractive intravenous fluids.  She was placed on vasopressors and broad-spectrum antibiotic therapy.   CT of the abdomen pelvis right hemicolectomy with a suture site in the mid abdomen.  Positive fistulous connection between the bowel adjacent to the suture into the overlying abdominal wall and to the skin surface through a pre-existing umbilical hernia.  The fistula is in communication with a focal collection with layering debris's measuring up to 7.4 cm in the abdominal wall consistent with abscess. Extensive soft tissue emphysema tracking through the body wall.  No free intraperitoneal air identified.   Admitted to [facility] intensive care unit, placed on broad-spectrum antibiotic therapy including meropenem, vancomycin, antifungal therapy. General surgery was consulted. Patient transferred to [facility] for further care.  Vasopressors were weaned off successfully. TPN started on 8/25.   Transferred to [facility] 8/27.    Follow-up CT 9/17 with overall improvement and no signs of persistent communication within the bowel lumen and wound.   Patient slowly progressing but continue to have fistula drainage Patient remains NPO.    On TPN for  nutrition, has peripheral edema. She has been very frustrated because can not eat.  Fistula may take 8 to 9 weeks to close, until then it is recommended patient to stay NPO, otherwise fistula can become chronic.    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 New diagnosis of metastatic colon adenocarcinoma. Completed antibiotic therapy.  Pathology report with adenocarcinoma moderately differentiated with metastasis to one of 12 lymph nodes.    Patient with persistent leak, nutritional support with TPN.  Patient continue to ask for po water and soda.  She was explained by previous hospitalist and by myself as well as surgery team about the potential consequences of fistula not closing if she were to take p.o. She has the option to resume po intake and have chronic leak requiring ostomy care.  She keeps going back and forth with requesting to let her drink water at least. Follow up with palliative care.   SNF not accepting patient on TPN, transition of care looking into possible LTAC.    2. Left thigh hematoma, with acute blood loss anemia.  sp 4 units PRBC transfusion during this hospitalization.  Hemoglobin is stable.  Monitor.  3. Atrial fibrillation with RVR.  On amiodarone and metoprolol for rate control.  Because of recent bleed will continue with enoxaparin at prophylactic dosing.    4. Chronic  systolic heart failure. LV systolic function decreased to 30%, positive diastolic dysfunction as well.  Positive edema and lymphedema at the lower extremities which is chronic. Continue with nutritional support. Avoid volume overload.    5.  AKI, hyponatremia, hypokalemia, hypomagnesemia, hypochloremia.  Resolved   6. Obesity class 3, bilateral lower extremity chronic lymphedema.  Coccyx and thigh pressure ulcer (resolved per nursing documentation 09.25) PT and OT recommends SNF.  TOC on board.  No SNF will take her unless she is off of TPN.  TOC working on finding LTAC.  TED hose has been ordered since past 2 days for the patient but patient has not received them yet.  Patient has been advised multiple times to keep her legs elevated as well.  We will continue torsemide 20 mg p.o. twice daily.   7. Metabolic encephalopathy with delirium, anxiety and depression,  Encephalopathy resolved.  Continue with citalopram and PRN lorazepam. Pain control with hydromorphone and oxycodone.    8. Hypothyroid. Continue with levothyroxine    9. COVID 19 infection, no viral pneumonia, now off isolation    Status is: Inpatient  Remains inpatient appropriate because:Inpatient level of care appropriate due to severity of illness  Dispo: The patient is from: Home              Anticipated d/c is to: SNF vs LTAC              Patient currently is medically stable to d/c.   Difficult to place patient YES  DVT prophylaxis: Enoxaparin   Code Status:    DNR   Family Communication:  No family at the bedside      Nutrition Status: Nutrition Problem: Inadequate oral intake Etiology: altered GI function, acute illness Signs/Symptoms: NPO status Interventions: TPN     Consultants:  Surgery    Subjective: Seen and examined.  No complaints.  Objective: Vitals:   08/23/21 2041 08/24/21 0900 08/24/21 2109 08/25/21 0500  BP:  (!) 105/56 115/67 106/72  Pulse:  88 77 70  Resp:  16 16 16   Temp:  98.4 F (36.9 C) 97.9 F (36.6 C) 98 F (36.7 C)  TempSrc: Oral Oral Oral Oral  SpO2: 97% 96% 96% 95%  Weight:    (!) 141 kg  Height:        Intake/Output Summary (Last 24 hours) at 08/25/2021 1059 Last data filed at 08/25/2021 0700 Gross  per 24 hour  Intake 4216.07 ml  Output 8900 ml  Net -4683.93 ml    Filed Weights   08/16/21 0343 08/18/21 0410 08/25/21 0500  Weight: (!) 146.7 kg (!) 151 kg (!) 141 kg    Examination:  General exam: Appears calm and comfortable, obese Respiratory system: Clear to auscultation. Respiratory effort normal. Cardiovascular system: S1 & S2 heard, RRR. No JVD, murmurs, rubs, gallops or clicks.  Massive lymphedema with pitting edema. Gastrointestinal system: Abdomen is nondistended, soft and nontender. No organomegaly or masses felt. Normal bowel sounds heard. Central nervous system: Alert and oriented. No focal neurological deficits. Extremities: Symmetric 5 x 5 power. Skin: No rashes, lesions or ulcers.  Psychiatry: Judgement and insight appear poor   Data Reviewed: I have personally reviewed following labs and imaging studies  CBC: Recent Labs  Lab 08/22/21 0149  WBC 5.9  HGB 8.8*  HCT 30.2*  MCV 93.5  PLT 342    Basic Metabolic Panel: Recent Labs  Lab 08/20/21 0626 08/21/21 0905 08/22/21 0149 08/23/21 0448 08/24/21 0220 08/25/21 0500  NA 136 134*  134* 136 138 141  K 4.5 4.4 4.5 4.5 4.1 3.8  CL 107 107 109 111 112* 108  CO2 25 24 22 22  21* 27  GLUCOSE 71 84 103* 98 89 88  BUN 18 18 20 23  25* 32*  CREATININE 0.52 0.53 0.46 0.50 0.56 0.58  CALCIUM 8.0* 8.0* 7.7* 7.8* 7.9* 8.1*  MG 1.7 1.7 2.0 2.0  --  2.0  PHOS 3.1 3.7 3.7 3.5  --  4.9*    GFR: Estimated Creatinine Clearance: 86.8 mL/min (by C-G formula based on SCr of 0.58 mg/dL). Liver Function Tests: Recent Labs  Lab 08/20/21 0626 08/23/21 0448  AST 51* 32  ALT 32 27  ALKPHOS 119 116  BILITOT 0.7 0.3  PROT 5.8* 5.3*  ALBUMIN 1.7* 1.6*    No results for input(s): LIPASE, AMYLASE in the last 168 hours. No results for input(s): AMMONIA in the last 168 hours. Coagulation Profile: No results for input(s): INR, PROTIME in the last 168 hours. Cardiac Enzymes: No results for input(s): CKTOTAL, CKMB,  CKMBINDEX, TROPONINI in the last 168 hours. BNP (last 3 results) No results for input(s): PROBNP in the last 8760 hours. HbA1C: No results for input(s): HGBA1C in the last 72 hours. CBG: Recent Labs  Lab 08/24/21 1350 08/24/21 1542 08/24/21 1958 08/24/21 2356 08/25/21 0300  GLUCAP 84 86 82 107* 98    Lipid Profile: No results for input(s): CHOL, HDL, LDLCALC, TRIG, CHOLHDL, LDLDIRECT in the last 72 hours.  Thyroid Function Tests: No results for input(s): TSH, T4TOTAL, FREET4, T3FREE, THYROIDAB in the last 72 hours. Anemia Panel: No results for input(s): VITAMINB12, FOLATE, FERRITIN, TIBC, IRON, RETICCTPCT in the last 72 hours.    Radiology Studies: I have reviewed all of the imaging during this hospital visit personally     Scheduled Meds:  amiodarone  200 mg Oral Daily   Chlorhexidine Gluconate Cloth  6 each Topical Daily   citalopram  20 mg Oral Daily   enoxaparin (LOVENOX) injection  0.5 mg/kg Subcutaneous Q24H   levothyroxine  150 mcg Oral Q0600   loratadine  10 mg Oral Daily   mouth rinse  15 mL Mouth Rinse BID   metoprolol tartrate  12.5 mg Oral BID   pantoprazole  40 mg Oral Q0600   sodium chloride flush  10-40 mL Intracatheter Q12H   sodium chloride flush  10-40 mL Intracatheter Q12H   torsemide  20 mg Oral BID   Continuous Infusions:  TPN CYCLIC-ADULT (ION) 292 mL/hr at 44/62/86 3817   TPN CYCLIC-ADULT (ION)       LOS: 38 days   Total time spent in minutes: 27 minutes Darliss Cheney, MD [facility]/hospitalist

## 2021-08-25 NOTE — Progress Notes (Signed)
PHARMACY - TOTAL PARENTERAL NUTRITION CONSULT NOTE  Indication: Fistula  Patient Measurements: Height: 5\' 2"  (157.5 cm) Weight: (!) 141 kg (310 lb 13.6 oz) IBW/kg (Calculated) : 50.1 TPN AdjBW (KG): 66.6 Body mass index is 56.85 kg/m.  Assessment:  76 YOF with recent R-hemicolectomy on 8/12 in the setting of a cecal mass - consistent with R-colon metastatic adenocarcinoma. 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 Pharmacy consulted to manage TPN for nutritional support in the setting of a colocutaneous fistula.    Glucose / Insulin: CBGs 82-107 on TPN. CBGs 84-91 off TPN. No incidences of hypoglycemia requiring treatment. SSI d/c'ed  Electrolytes: Na 141, Cl down to 108, Phos 4.9, CoCa 10. Other electrolytes wnl.  Renal:  SCr < 1 stable, BUN 32. Started on torsemide po on 9/29. Hepatic: LFTs WNL, tbili / TG WNL, albumin 1.6 Intake / Output; MIVF: UOP 2.63ml/kg/hr, LBM 9/30 GI Imaging:  8/24 CT: colocutaneous fistula, abd wall abscess, soft tissue edema, cellulitis vs nec fasciitis 8/25 Abd xray: diffuse abd wall emphysema, nonobstructive bowel gas pattern 9/4 CT: medial upper left thigh hematoma 9/17 CT: no evidence of communication between bowel and subQ tissue. Decrease volume extensive subcutaneous gas along the RIGHT abd wall and upper groin. GI Surgeries / Procedures:  8/12 at [facility]: R-hemicolectomy 8/24: Midline wound opened by Dr. Arnoldo Morale at bedside  Central access: PICC replaced 07/27/21 TPN start date: 07/19/21  Nutritional Goals (RD assessment on 9/29): 2300-2500 kcal, 140-160g AA, fluid >/= 2L per day  Current Nutrition:  TPN - not hung 9/25 due to PICC tip not being in the right place. PICC to be exchanged 9/26. Back to NPO 9/20 to allow fistula to close  Plan:  Cycle TPN over 16 hrs and monitor closely for hypoglycemia. Continue q4h CBG monitoring and obtain extra POCT CBG at 1400 - RN aware.  Cycle TPN over  16 hours at goal volume of 2500 mL/24 hours, providing 150g AA 312g CHO and 71g ILE for a total of 2362 kCal, meeting 100% of estimated needs Electrolytes in TPN: Decrease Na to 100 mEq/L. Decrease phos to 15 mmol/L. Continue K 38mEq/L,  Mg 86mEq/L. Continue Ca 4 mEq/L - will watch closely for need to adjust. Continue Cl:Ac 1:1. Add standard MVI and trace elements to TPN Standard TPN labs on Mon and Thurs, repeat electrolytes tomorrow  Cristela Felt, PharmD, BCPS Clinical Pharmacist 08/25/2021 7:25 AM

## 2021-08-26 DIAGNOSIS — R6521 Severe sepsis with septic shock: Secondary | ICD-10-CM | POA: Diagnosis not present

## 2021-08-26 DIAGNOSIS — A419 Sepsis, unspecified organism: Secondary | ICD-10-CM | POA: Diagnosis not present

## 2021-08-26 LAB — LACTIC ACID, PLASMA: Lactic Acid, Venous: 0.6 mmol/L (ref 0.5–1.9)

## 2021-08-26 LAB — BASIC METABOLIC PANEL
Anion gap: 6 (ref 5–15)
BUN: 35 mg/dL — ABNORMAL HIGH (ref 8–23)
CO2: 32 mmol/L (ref 22–32)
Calcium: 7.9 mg/dL — ABNORMAL LOW (ref 8.9–10.3)
Chloride: 102 mmol/L (ref 98–111)
Creatinine, Ser: 0.6 mg/dL (ref 0.44–1.00)
GFR, Estimated: >60 mL/min (ref >60)
Glucose, Bld: 70 mg/dL (ref 70–99)
Potassium: 3.7 mmol/L (ref 3.5–5.1)
Sodium: 140 mmol/L (ref 135–145)

## 2021-08-26 LAB — CBC WITH DIFFERENTIAL/PLATELET
Abs Immature Granulocytes: 0.01 10*3/uL (ref 0.00–0.07)
Basophils Absolute: 0 10*3/uL (ref 0.0–0.1)
Basophils Relative: 1 %
Eosinophils Absolute: 0.3 10*3/uL (ref 0.0–0.5)
Eosinophils Relative: 6 %
HCT: 26.5 % — ABNORMAL LOW (ref 36.0–46.0)
Hemoglobin: 7.9 g/dL — ABNORMAL LOW (ref 12.0–15.0)
Immature Granulocytes: 0 %
Lymphocytes Relative: 23 %
Lymphs Abs: 1.2 10*3/uL (ref 0.7–4.0)
MCH: 27.1 pg (ref 26.0–34.0)
MCHC: 29.8 g/dL — ABNORMAL LOW (ref 30.0–36.0)
MCV: 91.1 fL (ref 80.0–100.0)
Monocytes Absolute: 0.6 10*3/uL (ref 0.1–1.0)
Monocytes Relative: 12 %
Neutro Abs: 3.2 10*3/uL (ref 1.7–7.7)
Neutrophils Relative %: 58 %
Platelets: 140 10*3/uL — ABNORMAL LOW (ref 150–400)
RBC: 2.91 MIL/uL — ABNORMAL LOW (ref 3.87–5.11)
RDW: 19.1 % — ABNORMAL HIGH (ref 11.5–15.5)
WBC: 5.4 10*3/uL (ref 4.0–10.5)
nRBC: 0 % (ref 0.0–0.2)

## 2021-08-26 LAB — GLUCOSE, CAPILLARY
Glucose-Capillary: 88 mg/dL (ref 70–99)
Glucose-Capillary: 105 mg/dL — ABNORMAL HIGH (ref 70–99)
Glucose-Capillary: 94 mg/dL (ref 70–99)
Glucose-Capillary: 94 mg/dL (ref 70–99)

## 2021-08-26 LAB — MAGNESIUM: Magnesium: 2 mg/dL (ref 1.7–2.4)

## 2021-08-26 LAB — PHOSPHORUS: Phosphorus: 4.7 mg/dL — ABNORMAL HIGH (ref 2.5–4.6)

## 2021-08-26 MED ORDER — TRAVASOL 10 % IV SOLN
INTRAVENOUS | Status: AC
  Filled 2021-08-26: qty 1500

## 2021-08-26 MED ORDER — PANTOPRAZOLE 2 MG/ML SUSPENSION
40.0000 mg | Freq: Every day | ORAL | Status: DC
  Administered 2021-08-26 – 2021-08-29 (×4): 40 mg via ORAL
  Filled 2021-08-26 (×5): qty 20

## 2021-08-26 NOTE — Plan of Care (Signed)
Pt not diuresed today d/t soft bp pressures. 1 BM. CHG bath given. Zofran IV x1 for nausea. No appetite. No c/o pain for dayshift.   Problem: Fluid Volume: Goal: Hemodynamic stability will improve Outcome: Progressing   Problem: Clinical Measurements: Goal: Diagnostic test results will improve Outcome: Progressing Goal: Signs and symptoms of infection will decrease Outcome: Progressing   Problem: Respiratory: Goal: Ability to maintain adequate ventilation will improve Outcome: Progressing   Problem: Education: Goal: Knowledge of General Education information will improve Description: Including pain rating scale, medication(s)/side effects and non-pharmacologic comfort measures Outcome: Progressing   Problem: Health Behavior/Discharge Planning: Goal: Ability to manage health-related needs will improve Outcome: Progressing   Problem: Clinical Measurements: Goal: Ability to maintain clinical measurements within normal limits will improve Outcome: Progressing Goal: Will remain free from infection Outcome: Progressing Goal: Diagnostic test results will improve Outcome: Progressing Goal: Respiratory complications will improve Outcome: Progressing Goal: Cardiovascular complication will be avoided Outcome: Progressing   Problem: Activity: Goal: Risk for activity intolerance will decrease Outcome: Progressing   Problem: Nutrition: Goal: Adequate nutrition will be maintained Outcome: Progressing   Problem: Coping: Goal: Level of anxiety will decrease Outcome: Progressing   Problem: Elimination: Goal: Will not experience complications related to bowel motility Outcome: Progressing Goal: Will not experience complications related to urinary retention Outcome: Progressing   Problem: Pain Managment: Goal: General experience of comfort will improve Outcome: Progressing   Problem: Safety: Goal: Ability to remain free from injury will improve Outcome: Progressing    Problem: Skin Integrity: Goal: Risk for impaired skin integrity will decrease Outcome: Progressing   Problem: Education: Goal: Knowledge about tracheostomy care/management will improve Outcome: Progressing   Problem: Activity: Goal: Ability to tolerate increased activity will improve Outcome: Progressing   Problem: Health Behavior/Discharge Planning: Goal: Ability to manage tracheostomy will improve Outcome: Progressing   Problem: Respiratory: Goal: Patent airway maintenance will improve Outcome: Progressing   Problem: Role Relationship: Goal: Ability to communicate will improve Outcome: Progressing

## 2021-08-26 NOTE — Progress Notes (Signed)
PHARMACY - TOTAL PARENTERAL NUTRITION CONSULT NOTE  Indication: Fistula  Patient Measurements: Height: 5\' 2"  (157.5 cm) Weight: (!) 141 kg (310 lb 13.6 oz) IBW/kg (Calculated) : 50.1 TPN AdjBW (KG): 66.6 Body mass index is 56.85 kg/m.  Assessment:  23 YOF with recent R-hemicolectomy on 8/12 in the setting of a cecal mass - consistent with R-colon metastatic adenocarcinoma. 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 Pharmacy consulted to manage TPN for nutritional support in the setting of a colocutaneous fistula.    Glucose / Insulin: CBGs 70-88 on TPN. CBGs 79 and 97 off TPN. No incidences of hypoglycemia requiring treatment. SSI d/c'ed  Electrolytes: Na 140 (Na in TPN reduced 10/1), Cl down to 102, Phos 4.7 (phos in TPN reduced 10/1), CoCa 9.8. Other electrolytes wnl.  Renal:  SCr stable, BUN 35 - receiving ~2g/kg/day protein (ABW) and on po torsemide. Started on torsemide po on 9/29. Hepatic: LFTs WNL, tbili / TG WNL, albumin 1.6 Intake / Output; MIVF: UOP 2.55ml/kg/hr + 3 occurrences, LBM 10/1 GI Imaging:  8/24 CT: colocutaneous fistula, abd wall abscess, soft tissue edema, cellulitis vs nec fasciitis 8/25 Abd xray: diffuse abd wall emphysema, nonobstructive bowel gas pattern 9/4 CT: medial upper left thigh hematoma 9/17 CT: no evidence of communication between bowel and subQ tissue. Decrease volume extensive subcutaneous gas along the RIGHT abd wall and upper groin. GI Surgeries / Procedures:  8/12 at [facility]: R-hemicolectomy 8/24: Midline wound opened by Dr. Arnoldo Morale at bedside  Central access: PICC replaced 07/27/21 TPN start date: 07/19/21  Nutritional Goals (RD assessment on 9/29): 2300-2500 kcal, 140-160g AA, fluid >/= 2L per day  Current Nutrition:  TPN - not hung 9/25 due to PICC tip not being in the right place. PICC to be exchanged 9/26. Back to NPO 9/20 to allow fistula to close  Plan:  Cycle TPN over 16 hrs and  monitor closely for hypoglycemia. Continue q4h CBG monitoring and obtain extra POCT CBG at 1400 - RN aware. If continues to remain stable will consider 14 hr cycle tomorrow.  Cycle TPN over 16 hours at goal volume of 2500 mL/24 hours, providing 150g AA 312g CHO and 71g ILE for a total of 2362 kCal, meeting 100% of estimated needs Electrolytes in TPN: Decrease phos to 12 mmol/L. Continue Na 100 mEq/L, K 76mEq/L, Mg 74mEq/L. Adjust Cl:Ac 2:1.  Add standard MVI and trace elements to TPN Standard TPN labs on Mon and Thurs Follow up dispo plans - social work looking into [facility], pt unable to go to SNF on TPN Discussed with Dr. Doristine Bosworth and holding torsemide with BUN rise  Cristela Felt, PharmD, BCPS Clinical Pharmacist 08/26/2021 7:20 AM

## 2021-08-26 NOTE — Progress Notes (Signed)
PROGRESS NOTE    Lauren [name]  VOJ:[id number] DOB: 1946-04-07 DOA: 07/18/2021 PCP: Pcp, No    Brief Narrative:  Lauren [name] was admitted to the hospital with the working diagnosis of colocutaneous fistula with surrounding cellulitis/ abscess, complicated with septic shock.  Now she has persistent colocutaneous leak. Not able to transfer to SNF until off TPN.    75 year old female who was brought to the hospital due to hypotension.  Recent hospitalization 07/03/21-07/17/2021 for gastrointestinal bleeding due to cecal mass.  She had right hemicolectomy on 07/06/2021.  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   At home patient had nausea, abdominal pain, and right-sided back pain.  On her initial physical examination she was hypotensive, refractive intravenous fluids.  She was placed on vasopressors and broad-spectrum antibiotic therapy.   CT of the abdomen pelvis right hemicolectomy with a suture site in the mid abdomen.  Positive fistulous connection between the bowel adjacent to the suture into the overlying abdominal wall and to the skin surface through a pre-existing umbilical hernia.  The fistula is in communication with a focal collection with layering debris's measuring up to 7.4 cm in the abdominal wall consistent with abscess. Extensive soft tissue emphysema tracking through the body wall.  No free intraperitoneal air identified.   Admitted to [facility] intensive care unit, placed on broad-spectrum antibiotic therapy including meropenem, vancomycin, antifungal therapy. General surgery was consulted. Patient transferred to [facility] for further care.  Vasopressors were weaned off successfully. TPN started on 8/25.   Transferred to [facility] 8/27.    Follow-up CT 9/17 with overall improvement and no signs of persistent communication within the bowel lumen and wound.   Patient slowly progressing but continue to have fistula drainage Patient remains NPO.    On TPN for  nutrition, has peripheral edema. She has been very frustrated because can not eat.  Fistula may take 8 to 9 weeks to close, until then it is recommended patient to stay NPO, otherwise fistula can become chronic.    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 New diagnosis of metastatic colon adenocarcinoma. Completed antibiotic therapy.  Pathology report with adenocarcinoma moderately differentiated with metastasis to one of 12 lymph nodes.    Patient with persistent leak, nutritional support with TPN.  Patient continue to ask for po water and soda.  She was explained by previous hospitalist and by myself as well as surgery team about the potential consequences of fistula not closing if she were to take p.o. She has the option to resume po intake and have chronic leak requiring ostomy care.  She keeps going back and forth with requesting to let her drink water at least. Follow up with palliative care.   SNF not accepting patient on TPN, transition of care looking into possible LTAC.    2. Left thigh hematoma, with acute blood loss anemia.  sp 4 units PRBC transfusion during this hospitalization.  Hemoglobin is stable.  Monitor.  3. Atrial fibrillation with RVR.  On amiodarone and metoprolol for rate control.  Because of recent bleed will continue with enoxaparin at prophylactic dosing.  Rates are controlled however  due to low blood pressure, we are holding both medications for now and watching closely.   4. Chronic systolic heart failure. LV systolic function decreased to 30%, positive diastolic dysfunction as well.  Positive edema and  lymphedema at the lower extremities which is chronic. Continue with nutritional support. Avoid volume overload.    5. AKI, hyponatremia, hypokalemia, hypomagnesemia, hypochloremia.  Resolved   6. Obesity class 3, bilateral lower extremity chronic lymphedema.  Coccyx and thigh pressure ulcer (resolved per nursing documentation 09.25) PT and OT recommends SNF.  TOC on board.  No SNF will take her unless she is off of TPN.  TOC working on finding LTAC.  TED hose has been ordered but reportedly, her lymphedema is too big to be fitted in any TED hose.  She was started on torsemide but her BUN is rising and blood pressure is dropping so we are holding that as well.     7. Metabolic encephalopathy with delirium, anxiety and depression,  Encephalopathy resolved.  Continue with citalopram and PRN lorazepam. Pain control with hydromorphone and oxycodone.    8. Hypothyroid. Continue with levothyroxine    9. COVID 19 infection, no viral pneumonia, now off isolation    Status is: Inpatient  Remains inpatient appropriate because:Inpatient level of care appropriate due to severity of illness  Dispo: The patient is from: Home              Anticipated d/c is to: SNF vs LTAC              Patient currently is medically stable to d/c.   Difficult to place patient YES  DVT prophylaxis: Enoxaparin   Code Status:    DNR   Family Communication:  No family at the bedside      Nutrition Status: Nutrition Problem: Inadequate oral intake Etiology: altered GI function, acute illness Signs/Symptoms: NPO status Interventions: TPN     Consultants:  Surgery    Subjective: Patient seen and examined.  She has no complaint.  Once again she is talking about letting her allowed to drink.  She seems to have some degree of dementia.  Objective: Vitals:   08/26/21 0630 08/26/21 0759 08/26/21 0800 08/26/21 0900  BP: (!) 87/55 (!) 94/43 (!) 108/56 (!) 97/55  Pulse: 87 (!) 101 93 (!) 58  Resp: 18 20 17 17    Temp: 97.8 F (36.6 C)     TempSrc: Oral     SpO2: 96% 95% 95% 93%  Weight:      Height:        Intake/Output Summary (Last 24 hours) at 08/26/2021 1157 Last data filed at 08/26/2021 1100 Gross per 24 hour  Intake 1220 ml  Output 6500 ml  Net -5280 ml    Filed Weights   08/16/21 0343 08/18/21 0410 08/25/21 0500  Weight: (!) 146.7 kg (!) 151 kg (!) 141 kg    Examination:  General exam: Appears calm and comfortable, obese Respiratory system: Clear to auscultation. Respiratory effort normal. Cardiovascular system: S1 & S2 heard, RRR. No JVD, murmurs, rubs, gallops or clicks.  Massive lymphedema. Gastrointestinal system: Abdomen is nondistended, soft and nontender. No organomegaly or masses felt. Normal bowel sounds heard. Central nervous system: Alert and oriented. No focal neurological deficits. Extremities: Symmetric 5 x 5 power. Skin: No rashes, lesions or ulcers.  Psychiatry: Judgement and insight appear poor   Data Reviewed: I have personally reviewed following labs and imaging studies  CBC: Recent Labs  Lab 08/22/21 0149 08/26/21 0645  WBC 5.9 5.4  NEUTROABS  --  3.2  HGB 8.8* 7.9*  HCT 30.2* 26.5*  MCV 93.5 91.1  PLT 201 140*    Basic Metabolic Panel: Recent Labs  Lab 08/21/21 0905 08/22/21 0149 08/23/21 0448 08/24/21 0220 08/25/21 0500 08/26/21 0358  NA 134* 134* 136 138 141 140  K 4.4 4.5 4.5 4.1 3.8 3.7  CL 107 109 111 112* 108 102  CO2 24 22 22  21* 27 32  GLUCOSE 84 103* 98 89 88 70  BUN 18 20 23  25* 32* 35*  CREATININE 0.53 0.46 0.50 0.56 0.58 0.60  CALCIUM 8.0* 7.7* 7.8* 7.9* 8.1* 7.9*  MG 1.7 2.0 2.0  --  2.0 2.0  PHOS 3.7 3.7 3.5  --  4.9* 4.7*    GFR: Estimated Creatinine Clearance: 86.8 mL/min (by C-G formula based on SCr of 0.6 mg/dL). Liver Function Tests: Recent Labs  Lab 08/20/21 0626 08/23/21 0448  AST 51* 32  ALT 32 27  ALKPHOS 119 116  BILITOT 0.7 0.3  PROT 5.8* 5.3*  ALBUMIN 1.7* 1.6*    No results for input(s):  LIPASE, AMYLASE in the last 168 hours. No results for input(s): AMMONIA in the last 168 hours. Coagulation Profile: No results for input(s): INR, PROTIME in the last 168 hours. Cardiac Enzymes: No results for input(s): CKTOTAL, CKMB, CKMBINDEX, TROPONINI in the last 168 hours. BNP (last 3 results) No results for input(s): PROBNP in the last 8760 hours. HbA1C: No results for input(s): HGBA1C in the last 72 hours. CBG: Recent Labs  Lab 08/25/21 1652 08/25/21 1928 08/26/21 0501 08/26/21 0745 08/26/21 1129  GLUCAP 79 83 88 105* 94    Lipid Profile: No results for input(s): CHOL, HDL, LDLCALC, TRIG, CHOLHDL, LDLDIRECT in the last 72 hours.  Thyroid Function Tests: No results for input(s): TSH, T4TOTAL, FREET4, T3FREE, THYROIDAB in the last 72 hours. Anemia Panel: No results for input(s): VITAMINB12, FOLATE, FERRITIN, TIBC, IRON, RETICCTPCT in the last 72 hours.    Radiology Studies: I have reviewed all of the imaging during this hospital visit personally     Scheduled Meds:  amiodarone  200 mg Oral Daily   Chlorhexidine Gluconate Cloth  6 each Topical Daily   citalopram  20 mg Oral Daily   enoxaparin (LOVENOX) injection  0.5 mg/kg Subcutaneous Q24H   levothyroxine  150 mcg Oral Q0600   loratadine  10 mg Oral Daily   mouth rinse  15 mL Mouth Rinse BID   metoprolol tartrate  12.5 mg Oral BID   pantoprazole sodium  40 mg Oral Q0600   sodium chloride flush  10-40 mL Intracatheter Q12H   sodium chloride flush  10-40 mL Intracatheter Q12H   Continuous Infusions:  TPN CYCLIC-ADULT (ION)       LOS: 39 days   Total time spent in minutes: 26 minutes Darliss Cheney, MD [facility]/hospitalist

## 2021-08-27 DIAGNOSIS — R6521 Severe sepsis with septic shock: Secondary | ICD-10-CM | POA: Diagnosis not present

## 2021-08-27 DIAGNOSIS — A419 Sepsis, unspecified organism: Secondary | ICD-10-CM | POA: Diagnosis not present

## 2021-08-27 LAB — COMPREHENSIVE METABOLIC PANEL
ALT: 20 U/L (ref 0–44)
AST: 25 U/L (ref 15–41)
Albumin: 1.6 g/dL — ABNORMAL LOW (ref 3.5–5.0)
Alkaline Phosphatase: 98 U/L (ref 38–126)
Anion gap: 4 — ABNORMAL LOW (ref 5–15)
BUN: 31 mg/dL — ABNORMAL HIGH (ref 8–23)
CO2: 30 mmol/L (ref 22–32)
Calcium: 7.9 mg/dL — ABNORMAL LOW (ref 8.9–10.3)
Chloride: 101 mmol/L (ref 98–111)
Creatinine, Ser: 0.52 mg/dL (ref 0.44–1.00)
GFR, Estimated: >60 mL/min (ref >60)
Glucose, Bld: 85 mg/dL (ref 70–99)
Potassium: 4 mmol/L (ref 3.5–5.1)
Sodium: 135 mmol/L (ref 135–145)
Total Bilirubin: 0.4 mg/dL (ref 0.3–1.2)
Total Protein: 5.5 g/dL — ABNORMAL LOW (ref 6.5–8.1)

## 2021-08-27 LAB — GLUCOSE, CAPILLARY
Glucose-Capillary: 114 mg/dL — ABNORMAL HIGH (ref 70–99)
Glucose-Capillary: 73 mg/dL (ref 70–99)
Glucose-Capillary: 78 mg/dL (ref 70–99)
Glucose-Capillary: 94 mg/dL (ref 70–99)
Glucose-Capillary: 98 mg/dL (ref 70–99)

## 2021-08-27 LAB — MAGNESIUM: Magnesium: 2.2 mg/dL (ref 1.7–2.4)

## 2021-08-27 LAB — TRIGLYCERIDES: Triglycerides: 53 mg/dL (ref <150)

## 2021-08-27 LAB — PHOSPHORUS: Phosphorus: 3.3 mg/dL (ref 2.5–4.6)

## 2021-08-27 MED ORDER — TRAVASOL 10 % IV SOLN
INTRAVENOUS | Status: AC
  Filled 2021-08-27: qty 1500

## 2021-08-27 NOTE — Plan of Care (Signed)
  Problem: Fluid Volume: Goal: Hemodynamic stability will improve Outcome: Progressing   Problem: Clinical Measurements: Goal: Diagnostic test results will improve Outcome: Progressing Goal: Signs and symptoms of infection will decrease Outcome: Progressing   Problem: Respiratory: Goal: Ability to maintain adequate ventilation will improve Outcome: Progressing   Problem: Education: Goal: Knowledge of General Education information will improve Description: Including pain rating scale, medication(s)/side effects and non-pharmacologic comfort measures Outcome: Progressing   Problem: Health Behavior/Discharge Planning: Goal: Ability to manage health-related needs will improve Outcome: Progressing   Problem: Clinical Measurements: Goal: Ability to maintain clinical measurements within normal limits will improve Outcome: Progressing Goal: Will remain free from infection Outcome: Progressing Goal: Diagnostic test results will improve Outcome: Progressing Goal: Respiratory complications will improve Outcome: Progressing Goal: Cardiovascular complication will be avoided Outcome: Progressing   Problem: Activity: Goal: Risk for activity intolerance will decrease Outcome: Progressing   Problem: Nutrition: Goal: Adequate nutrition will be maintained Outcome: Progressing   Problem: Coping: Goal: Level of anxiety will decrease Outcome: Progressing   Problem: Elimination: Goal: Will not experience complications related to bowel motility Outcome: Progressing Goal: Will not experience complications related to urinary retention Outcome: Progressing   Problem: Pain Managment: Goal: General experience of comfort will improve Outcome: Progressing   Problem: Safety: Goal: Ability to remain free from injury will improve Outcome: Progressing   Problem: Skin Integrity: Goal: Risk for impaired skin integrity will decrease Outcome: Progressing   Problem: Education: Goal:  Knowledge about tracheostomy care/management will improve Outcome: Progressing   Problem: Activity: Goal: Ability to tolerate increased activity will improve Outcome: Progressing   Problem: Health Behavior/Discharge Planning: Goal: Ability to manage tracheostomy will improve Outcome: Progressing   Problem: Respiratory: Goal: Patent airway maintenance will improve Outcome: Progressing   Problem: Role Relationship: Goal: Ability to communicate will improve Outcome: Progressing

## 2021-08-27 NOTE — Progress Notes (Signed)
Subjective/Chief Complaint: No complaints. Wants to eat.   Objective: Vital signs in last 24 hours: Temp:  [97.4 F (36.3 C)-98.3 F (36.8 C)] 97.9 F (36.6 C) (10/03 0400) Pulse Rate:  [98-103] 100 (10/03 0400) Resp:  [16-19] 18 (10/03 0400) BP: (103-121)/(48-73) 103/51 (10/03 0400) SpO2:  [92 %-100 %] 99 % (10/03 0400) Weight:  [140.1 kg] 140.1 kg (10/03 0423) Last BM Date: 08/26/21  Intake/Output from previous day: 10/02 0701 - 10/03 0700 In: 3079.1 [P.O.:300; I.V.:2779.1] Out: 1000 [Urine:1000] Intake/Output this shift: No intake/output data recorded.  General appearance: alert and cooperative Resp: clear to auscultation bilaterally Cardio: regular rate and rhythm GI: soft, minimal tenderness. Only small amount of fluid in eakins  Lab Results:  Recent Labs    08/26/21 0645  WBC 5.4  HGB 7.9*  HCT 26.5*  PLT 140*   BMET Recent Labs    08/26/21 0358 08/27/21 0601  NA 140 135  K 3.7 4.0  CL 102 101  CO2 32 30  GLUCOSE 70 85  BUN 35* 31*  CREATININE 0.60 0.52  CALCIUM 7.9* 7.9*   PT/INR No results for input(s): LABPROT, INR in the last 72 hours. ABG No results for input(s): PHART, HCO3 in the last 72 hours.  Invalid input(s): PCO2, PO2  Studies/Results: No results found.  Anti-infectives: Anti-infectives (From admission, onward)    Start     Dose/Rate Route Frequency Ordered Stop   08/12/21 0415  clindamycin (CLEOCIN) IVPB 600 mg        600 mg 100 mL/hr over 30 Minutes Intravenous  Once 08/12/21 0317 08/12/21 0434   07/20/21 1000  remdesivir 100 mg in sodium chloride 0.9 % 100 mL IVPB       See Hyperspace for full Linked Orders Report.   100 mg 200 mL/hr over 30 Minutes Intravenous Daily 07/19/21 1040 07/21/21 1148   07/20/21 0200  vancomycin (VANCOREADY) IVPB 750 mg/150 mL  Status:  Discontinued        750 mg 150 mL/hr over 60 Minutes Intravenous Every 24 hours 07/19/21 1036 07/19/21 1449   07/19/21 2300  anidulafungin (ERAXIS) 100 mg  in sodium chloride 0.9 % 100 mL IVPB  Status:  Discontinued        100 mg 78 mL/hr over 100 Minutes Intravenous Every 24 hours 07/18/21 2107 07/19/21 1449   07/19/21 1600  vancomycin (VANCOREADY) IVPB 750 mg/150 mL  Status:  Discontinued        750 mg 150 mL/hr over 60 Minutes Intravenous Every 24 hours 07/19/21 0919 07/19/21 1016   07/19/21 1600  vancomycin (VANCOREADY) IVPB 750 mg/150 mL  Status:  Discontinued        750 mg 150 mL/hr over 60 Minutes Intravenous Every 24 hours 07/19/21 1016 07/19/21 1036   07/19/21 1400  piperacillin-tazobactam (ZOSYN) IVPB 3.375 g  Status:  Discontinued        3.375 g 12.5 mL/hr over 240 Minutes Intravenous Every 8 hours 07/19/21 1016 07/31/21 1501   07/19/21 1130  remdesivir 200 mg in sodium chloride 0.9% 250 mL IVPB       See Hyperspace for full Linked Orders Report.   200 mg 580 mL/hr over 30 Minutes Intravenous Once 07/19/21 1040 07/19/21 1241   07/19/21 0100  metroNIDAZOLE (FLAGYL) IVPB 500 mg  Status:  Discontinued        500 mg 100 mL/hr over 60 Minutes Intravenous Every 12 hours 07/18/21 1203 07/18/21 1534   07/18/21 2200  ceFEPIme (MAXIPIME) 2 g in sodium  chloride 0.9 % 100 mL IVPB  Status:  Discontinued        2 g 200 mL/hr over 30 Minutes Intravenous Every 12 hours 07/18/21 1156 07/18/21 1203   07/18/21 2200  meropenem (MERREM) 2 g in sodium chloride 0.9 % 100 mL IVPB  Status:  Discontinued        2 g 200 mL/hr over 30 Minutes Intravenous Every 12 hours 07/18/21 2104 07/19/21 1011   07/18/21 2200  anidulafungin (ERAXIS) 200 mg in sodium chloride 0.9 % 200 mL IVPB        200 mg 78 mL/hr over 200 Minutes Intravenous  Once 07/18/21 2108 07/19/21 0419   07/18/21 1800  clindamycin (CLEOCIN) IVPB 900 mg  Status:  Discontinued        900 mg 100 mL/hr over 30 Minutes Intravenous Every 8 hours 07/18/21 1534 07/18/21 2103   07/18/21 1600  vancomycin (VANCOREADY) IVPB 2000 mg/400 mL  Status:  Discontinued        2,000 mg 200 mL/hr over 120 Minutes  Intravenous Every 24 hours 07/18/21 1544 07/19/21 0919   07/18/21 1545  piperacillin-tazobactam (ZOSYN) IVPB 3.375 g  Status:  Discontinued        3.375 g 12.5 mL/hr over 240 Minutes Intravenous Every 8 hours 07/18/21 1544 07/18/21 2104   07/18/21 1300  cefTRIAXone (ROCEPHIN) 2 g in sodium chloride 0.9 % 100 mL IVPB  Status:  Discontinued        2 g 200 mL/hr over 30 Minutes Intravenous Every 24 hours 07/18/21 1203 07/18/21 1628   07/18/21 1200  ceFEPIme (MAXIPIME) 2 g in sodium chloride 0.9 % 100 mL IVPB  Status:  Discontinued        2 g 200 mL/hr over 30 Minutes Intravenous Every 12 hours 07/18/21 1155 07/18/21 1156   07/18/21 0930  ceFEPIme (MAXIPIME) 2 g in sodium chloride 0.9 % 100 mL IVPB        2 g 200 mL/hr over 30 Minutes Intravenous  Once 07/18/21 0918 07/18/21 1135   07/18/21 0930  metroNIDAZOLE (FLAGYL) IVPB 500 mg  Status:  Discontinued        500 mg 100 mL/hr over 60 Minutes Intravenous  Once 07/18/21 1027 07/18/21 1534       Assessment/Plan: s/p * No surgery found * Advance diet. 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 Arnoldo Morale at [place] on 8/12 - path w/ adenocarcinoma. Oncology has seen on 9/5 - Midline wound opened by Dr. Arnoldo Morale at bedside 8/24 which had stool decompressing through CC fistula  - Currently has eakin's pouch - 30 cc out recorded for yesterday day shift, none recorded overnight but would estimate around 30-40 cc in pouch this AM - CT 9/17 w/ overall improvement and no sign of persistent communication between bowel lumen and wound  - Although imaging reassuring, given air and output still in eakin's pouch I am concerned the CC fistula has not completely closed. Made NPO again on 9/20. Cont TPN - Appreciate palliative assistance with GOC, last seen 9/28 - patient now reports that she can't take not having any more to drink and wants to be able to drink more and understands that this  may prevent fistula from closing  - patient is stable for discharge to SNF on TPN from a surgical standpoint and can follow up with Dr. Arnoldo Morale. Typically would give several weeks of NPO and TPN to try to allow fistula  to close on its own but if patient does not want to comply with this then she could eat and just continue Eakins pouch. Will defer outpatient management of this to Dr. Arnoldo Morale   FEN - NPO. TPN. IVF per primary VTE - SCDs, Lovenox, per primary  ID - None currently.    COVID + A. Fib on Xarelto at home  ABL anemia - FOBT positive 8/24 Hypothyroidism Severe protein calorie malnutrition - albumin 1.3. Pre-alb <5. On TNA  LOS: 40 days    Lauren [name] 08/27/2021

## 2021-08-27 NOTE — Progress Notes (Signed)
PHARMACY - TOTAL PARENTERAL NUTRITION CONSULT NOTE  Indication: Fistula  Patient Measurements: Height: 5\' 2"  (157.5 cm) Weight: (!) 140.1 kg (308 lb 13.8 oz) IBW/kg (Calculated) : 50.1 TPN AdjBW (KG): 66.6 Body mass index is 56.49 kg/m.  Assessment:  52 YOF with recent R-hemicolectomy on 8/12 in the setting of a cecal mass - consistent with R-colon metastatic adenocarcinoma. 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 Pharmacy consulted to manage TPN for nutritional support in the setting of a colocutaneous fistula.    Glucose / Insulin: CBGs <100 on TPN. No incidences of hypoglycemia requiring treatment. SSI d/c'ed  Electrolytes: Na 140>>135 (Na in TPN reduced 10/1), Cl 101, Phos 4.7>>3.3 (phos in TPN reduced 10/1), Mg 2.2 Other electrolytes wnl.  Renal:  SCr stable, BUN 31 - receiving ~2g/kg/day protein (ABW). Po torsemide since 9/29>>10/2 completed Hepatic: LFTs WNL, tbili / TG WNL, albumin 1.6 Intake / Output; MIVF: UOP 0.3 ml/kg/hr, LBM 10/2. CTM off diuretics GI Imaging:  8/24 CT: colocutaneous fistula, abd wall abscess, soft tissue edema, cellulitis vs nec fasciitis 8/25 Abd xray: diffuse abd wall emphysema, nonobstructive bowel gas pattern 9/4 CT: medial upper left thigh hematoma 9/17 CT: no evidence of communication between bowel and subQ tissue. Decrease volume extensive subcutaneous gas along the RIGHT abd wall and upper groin. GI Surgeries / Procedures:  8/12 at [facility]: R-hemicolectomy 8/24: Midline wound opened by Dr. Arnoldo Morale at bedside  Central access: PICC replaced 07/27/21 TPN start date: 07/19/21  Nutritional Goals (RD assessment on 9/29): 2300-2500 kcal, 140-160g AA, fluid >/= 2L per day  Current Nutrition:  TPN - not hung 9/25 due to PICC tip not being in the right place. PICC to be exchanged 9/26. Back to NPO 9/20 to allow fistula to close  Plan:  Cycle TPN over 16 hrs and monitor closely for  hypoglycemia. Continue q4h CBG monitoring and obtain extra POCT CBG at 1400 - RN aware. If continues to remain stable will consider 14 hr cycle tomorrow, 10/4.  Cycle TPN over 16 hours at goal volume of 2500 mL/24 hours, providing 150g AA 312g CHO and 71g ILE for a total of 2362 kCal, meeting 100% of estimated needs Electrolytes in TPN: Continue Na 100 mEq/L, K 70mEq/L, Ca 3mEq/L Mg 53mEq/L. Continue phos to 12 mmol/L.  Cl:Ac 2:1. (No changes 10/3 to TPN) Add standard MVI and trace elements to TPN Standard TPN labs on Mon and Thurs Follow up dispo plans - social work looking into [facility], pt unable to go to SNF on TPN  Thank you for allowing pharmacy to be a part of this patient's care.  Donnald Garre, PharmD Clinical Pharmacist  Please check AMION for all [place] numbers After 10:00 PM, call [place] [phone]

## 2021-08-27 NOTE — Progress Notes (Signed)
[facility] PROGRESS NOTE  Lauren [name] GEX:[id number] DOB: 08/18/1946 DOA: 07/18/2021 PCP: Pcp, No  Status: Remains inpatient appropriate because:Unsafe d/c plan and IV treatments appropriate due to intensity of illness or inability to take PO  Dispo: The patient is from: Home              Anticipated d/c is to: LTAC vs Home w/ Hospice or SNF w/ Hospice              Patient currently is medically stable to d/c.   Difficult to place patient Yes                Barriers to discharge:  Level of care: Med-Surg   Code Status: DNR Family Communication: No family at bedside DVT prophylaxis: Lovenox COVID vaccination status: Unknown    HPI: 75 year old female patient who recently underwent right hemicolectomy on 07/06/2021 and was found to have metastatic adenoma of the right colon at home she was having abdominal pain, nausea and right-sided back pain.  She presented to [facility] with sepsis physiology that was unresponsive to fluid challenge and was placed on vasopressors.  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  She was incidentally found to be COVID-positive at time of admission.  Due to the abnormal CT findings she was treated with broad-spectrum antibiotics and general surgery was consulted. The patient was transferred to [facility].  She eventually stabilized enough to move out of the ICU and was transferred to hospitalist team on 8/27.  Follow-up CT on 9/17 with overall improvement and no signs of persistent communication of the bowel lumen and the wound in the abdomen.  She remains n.p.o. and on parenteral nutrition.  As documented fistula can take 8 to 9 weeks to close once fistula can become chronic.  Subjective: Patient sleeping deeply.  When she awakened she seemed mildly confused but was easily reoriented.  She is eager to begin oral intake.  She also states she would like to get out of bed to the  chair  Objective: Vitals:   08/26/21 2000 08/27/21 0400  BP: 121/73 (!) 103/51  Pulse: (!) 103 100  Resp: 16 18  Temp: 98.3 F (36.8 C) 97.9 F (36.6 C)  SpO2: 100% 99%    Intake/Output Summary (Last 24 hours) at 08/27/2021 0742 Last data filed at 08/27/2021 0400 Gross per 24 hour  Intake 3079.06 ml  Output 1000 ml  Net 2079.06 ml   Filed Weights   08/18/21 0410 08/25/21 0500 08/27/21 0423  Weight: (!) 151 kg (!) 141 kg (!) 140.1 kg    Exam:  Constitutional: NAD, calm, comfortable Respiratory: clear to auscultation bilaterally, no wheezing, no crackles. Normal respiratory effort. No accessory muscle use.  Room air Cardiovascular: Regular rate and rhythm, no murmurs / rubs / gallops.  Soft bilateral lower extremity edema. Abdomen: no tenderness, no masses palpated.  Pouch has been placed over large colocutaneous fistula area with purulent appearing returns noted. Bowel sounds positive.  Neurologic: CN 2-12 grossly intact. Sensation intact, DTR normal. Strength 3-4/5 x all 4 extremities.  Psychiatric: Normal judgment and insight once reoriented.  Drowsy and eventually was able to reorient patient x3.  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 New diagnosis of metastatic colon adenocarcinoma. Completed IV Zosyn on 9/6-while in the ER patient did several different antibiotics and an antifungal x1 dose each Although  recent CT on 9/17 encouraging since there remains air in output from the Eakin's pouch surgery is concerned that the fistula has not completely closed Patient continues to have difficulty with only being allowed ice chips and sips of water with medications Palliative team consulted by previous provider given poor prognosis (colocutaneous fistula in context of metastatic colon cancer)  SNF previously have denied patient due to cost of TPN but [facility] will review  her chart Pain control with hydromorphone and oxycodone.    Left thigh hematoma, with acute blood loss anemia.  sp 4 units PRBC transfusion during this hospitalization.   Hemoglobin is stable between 7.9 and 8.8   Atrial fibrillation with RVR.  Preadmission medications of amiodarone and metoprolol currently on hold secondary to hypotension Recent GI bleed therefore will utilize prophylactic enoxaparin dose   Combined chronic systolic and diastolic heart failure.  LV systolic function 90%, w/diastolic dysfunction  Chronic lower extremity edema and lymphedema remain    AKI, hyponatremia, hypokalemia, hypomagnesemia, hypochloremia.  Resolved   Obesity class 3/severe protein calorie malnutrition Body mass index is 56.49 kg/m.  Has hypoalbuminemia with serum albumin less than 1.6 Nutrition Problem: Inadequate oral intake Etiology: altered GI function, acute illness Signs/Symptoms: NPO status Interventions: TPN   Coccyx and thigh pressure ulcer  Nursing has documented resolved as of 9/25  Physical debility PT documents making slow progress towards goals and needs significant encouragement. Recommendation is for SNF   Metabolic encephalopathy with delirium Resolved  Anxiety and depression,  Continue with citalopram and PRN lorazepam.   Hypothyroidism Continue levothyroxine    COVID 19 infection, Was completely asymptomatic without any pneumonia or other significant abnormality Was positive on 8/24 and has completed quarantine     Data Reviewed: Basic Metabolic Panel: Recent Labs  Lab 08/22/21 0149 08/23/21 0448 08/24/21 0220 08/25/21 0500 08/26/21 0358 08/27/21 0601  NA 134* 136 138 141 140 135  K 4.5 4.5 4.1 3.8 3.7 4.0  CL 109 111 112* 108 102 101  CO2 22 22 21* 27 32 30  GLUCOSE 103* 98 89 88 70 85  BUN 20 23 25* 32* 35* 31*  CREATININE 0.46 0.50 0.56 0.58 0.60 0.52  CALCIUM 7.7* 7.8* 7.9* 8.1* 7.9* 7.9*  MG 2.0 2.0  --  2.0 2.0 2.2  PHOS 3.7 3.5  --   4.9* 4.7* 3.3   Liver Function Tests: Recent Labs  Lab 08/23/21 0448 08/27/21 0601  AST 32 25  ALT 27 20  ALKPHOS 116 98  BILITOT 0.3 0.4  PROT 5.3* 5.5*  ALBUMIN 1.6* 1.6*    CBC: Recent Labs  Lab 08/22/21 0149 08/26/21 0645  WBC 5.9 5.4  NEUTROABS  --  3.2  HGB 8.8* 7.9*  HCT 30.2* 26.5*  MCV 93.5 91.1  PLT 201 140*    CBG: Recent Labs  Lab 08/26/21 1129 08/26/21 2042 08/27/21 0057 08/27/21 0419 08/27/21 0736  GLUCAP 94 94 94 98 114*      Scheduled Meds:  amiodarone  200 mg Oral Daily   Chlorhexidine Gluconate Cloth  6 each Topical Daily   citalopram  20 mg Oral Daily   enoxaparin (LOVENOX) injection  0.5 mg/kg Subcutaneous Q24H   levothyroxine  150 mcg Oral Q0600   loratadine  10 mg Oral Daily   mouth rinse  15 mL Mouth Rinse BID   metoprolol tartrate  12.5 mg Oral BID   pantoprazole sodium  40 mg Oral Q0600   sodium chloride flush  10-40 mL Intracatheter Q12H  sodium chloride flush  10-40 mL Intracatheter Q12H   Continuous Infusions:  TPN CYCLIC-ADULT (ION) 170 mL/hr at 08/26/21 1821    Principal Problem:   Septic shock ([place]) Active Problems:   Hypokalemia   Morbid obesity with BMI of 50.0-59.9, adult ([place])   Colon carcinoma ([place])   Enterocutaneous fistula   Cellulitis   AKI (acute kidney injury) ([place])   Hyponatremia   Abdominal pain   Consultants: General surgery  Procedures: Double lumen PICC placed on 9/26 to RUE  Antibiotics: Anidulafungin x1 dose on 8/24 Cefepime x1 dose on 8/24 Rocephin x1 dose on 8/24 Meropenem x1 dose on 8/24 Flagyl x1 dose on 8/24 Zosyn 8/24 through 9/6 Remdesivir x3 doses 8/25 through 8/27   Time spent: 35 minutes    Erin Hearing ANP  [facility] 7 am - 330 pm/M-F for direct patient care and secure chat Please refer to Amion for contact info 40  days

## 2021-08-28 DIAGNOSIS — U071 COVID-19: Secondary | ICD-10-CM | POA: Diagnosis not present

## 2021-08-28 DIAGNOSIS — L03311 Cellulitis of abdominal wall: Secondary | ICD-10-CM | POA: Diagnosis not present

## 2021-08-28 DIAGNOSIS — R6521 Severe sepsis with septic shock: Secondary | ICD-10-CM | POA: Diagnosis not present

## 2021-08-28 DIAGNOSIS — A419 Sepsis, unspecified organism: Secondary | ICD-10-CM | POA: Diagnosis not present

## 2021-08-28 LAB — GLUCOSE, CAPILLARY
Glucose-Capillary: 90 mg/dL (ref 70–99)
Glucose-Capillary: 77 mg/dL (ref 70–99)
Glucose-Capillary: 97 mg/dL (ref 70–99)
Glucose-Capillary: 84 mg/dL (ref 70–99)

## 2021-08-28 MED ORDER — BOOST / RESOURCE BREEZE PO LIQD CUSTOM
1.0000 | Freq: Three times a day (TID) | ORAL | Status: DC
  Administered 2021-08-28 – 2021-08-31 (×5): 1 via ORAL

## 2021-08-28 MED ORDER — TRAVASOL 10 % IV SOLN
INTRAVENOUS | Status: AC
  Filled 2021-08-28: qty 1500

## 2021-08-28 NOTE — Progress Notes (Signed)
PHARMACY - TOTAL PARENTERAL NUTRITION CONSULT NOTE  Indication: Fistula  Patient Measurements: Height: 5\' 2"  (157.5 cm) Weight: (!) 140.1 kg (308 lb 13.8 oz) IBW/kg (Calculated) : 50.1 TPN AdjBW (KG): 66.6 Body mass index is 56.49 kg/m.  Assessment:  15 YOF with recent R-hemicolectomy on 8/12 in the setting of a cecal mass - consistent with R-colon metastatic adenocarcinoma. 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 Pharmacy consulted to manage TPN for nutritional support in the setting of a colocutaneous fistula.    Glucose / Insulin: CBGs <100 on TPN. No incidences of hypoglycemia requiring treatment. SSI d/c'ed  Electrolytes: Na 140>>135 (Na in TPN reduced 10/1), Cl 101, Phos 4.7>>3.3 (phos in TPN reduced 10/1), Mg 2.2 Other electrolytes wnl.  Renal:  SCr stable, BUN 31 - receiving ~2g/kg/day protein (ABW). Po torsemide 9/29>>10/2 completed Hepatic: LFTs WNL, tbili / TG WNL, albumin 1.6 Intake / Output; MIVF: UOP 0.3 ml/kg/hr, LBM 10/2. CTM off diuretics GI Imaging:  8/24 CT: colocutaneous fistula, abd wall abscess, soft tissue edema, cellulitis vs nec fasciitis 8/25 Abd xray: diffuse abd wall emphysema, nonobstructive bowel gas pattern 9/4 CT: medial upper left thigh hematoma 9/17 CT: no evidence of communication between bowel and subQ tissue. Decrease volume extensive subcutaneous gas along the RIGHT abd wall and upper groin. GI Surgeries / Procedures:  8/12 at [facility]: R-hemicolectomy 8/24: Midline wound opened by Dr. Arnoldo Morale at bedside  Central access: PICC replaced 07/27/21 TPN start date: 07/19/21  Nutritional Goals (RD assessment on 9/29): 2300-2500 kcal, 140-160g AA, fluid >/= 2L per day  Current Nutrition:  TPN - not hung 9/25 due to PICC tip not being in the right place. PICC to be exchanged 9/26. Back to NPO 9/20 to allow fistula to close  Plan:  Cycle TPN over 14 hrs and monitor closely for hypoglycemia.  Continue q4h CBG monitoring and obtain extra POCT CBG at 1400 - RN aware. If continues to remain stable. F/u with nursing in the AM to monitor closely for hypoglycemia Cycle TPN over 14 hours at goal volume of 2500 mL/24 hours, providing 150g AA 312g CHO and 71g ILE for a total of 2362 kCal, meeting 100% of estimated needs Electrolytes in TPN: Continue Na 100 mEq/L, K 73mEq/L, Ca 1mEq/L Mg 87mEq/L. Continue phos to 12 mmol/L.  Cl:Ac 2:1. (No changes 10/4 to TPN) Add standard MVI and trace elements to TPN Standard TPN labs on Mon and Thurs Follow up dispo plans - social work looking into [facility], pt unable to go to SNF on TPN. Medically ready for discharge.   Thank you for allowing pharmacy to be a part of this patient's care.  Donnald Garre, PharmD Clinical Pharmacist  Please check AMION for all [place] numbers After 10:00 PM, call [place] [phone]

## 2021-08-28 NOTE — Progress Notes (Signed)
Physical Therapy Treatment Patient Details Name: Lauren [name] MRN: [id number] DOB: 1946-03-27 Today's Date: 08/28/2021   History of Present Illness Pt is a 75 y.o. female admitted from [facility] on 3/00/92 with hypotension and Covid (+). Pt with colocutaneous fistula, Afib and new CHf since admission. Pt also with abdominal wall cellulitis and left thigh hematoma requiring blood transfusion. Of note, recent admission (8/9-8/23) to [facility] with partially obstructing tumor in the ascending colon s/p R hemicolectomy 07/06/21. PMH includes A fib, morbid obesity, HTN, lymphedema, hypothyroidism.    PT Comments    Patient progressing slowly and still needing some encouragement for mobility though easy to engage.  Patient with difficulty moving L LE > R for bed mobility and transfers noting L leg with more lymphedema than R and needing cues and assisted weight shifts to allow pt to move feet in standing.  She will continue to benefit from skilled PT in the acute setting and will need follow up SNF level rehab at d/c.  Goals updated this session.   Recommendations for follow up therapy are one component of a multi-disciplinary discharge planning process, led by the attending physician.  Recommendations may be updated based on patient status, additional functional criteria and insurance authorization.  Follow Up Recommendations  SNF;Supervision/Assistance - 24 hour     Equipment Recommendations  None recommended by PT    Recommendations for Other Services       Precautions / Restrictions Precautions Precautions: Fall Precaution Comments: BLE lymphedema, Eakin's pouch for colocutaneous fistula, incontinence Restrictions Weight Bearing Restrictions: No     Mobility  Bed Mobility Overal bed mobility: Needs Assistance Bed Mobility: Rolling;Sidelying to Sit Rolling: Mod assist;+2 for physical assistance Sidelying to sit: +2 for physical assistance;Mod assist       General bed mobility  comments: pt with increased assistance with rolling, reaching for bilateral rails, due to edema in BLE, pt required moderate assistance for BLE management;pt required modA to progress trunk to upright position from sidelying and required modA for BLE management off the bed    Transfers Overall transfer level: Needs assistance Equipment used: 2 person hand held assist Transfers: Sit to/from [employer] Transfers Sit to Stand: Mod assist;+2 safety/equipment;+2 physical assistance Stand pivot transfers: Max assist;+2 physical assistance       General transfer comment: able to stand wtih +2 A from EOB, difficulty progressing legs to step to recliner so assist for weight shift and cues for moving each leg stepping forward toward chair wtih R and backing L to allow pivot to chair  Ambulation/Gait                 Stairs             Wheelchair Mobility    Modified Rankin (Stroke Patients Only)       Balance Overall balance assessment: Needs assistance Sitting-balance support: Feet supported;No upper extremity supported;Single extremity supported Sitting balance-Leahy Scale: Fair Sitting balance - Comments: sitting EOB with S once feet on the floor   Standing balance support: Bilateral upper extremity supported Standing balance-Leahy Scale: Poor Standing balance comment: reliant on external support;increased assistance when weight shifting                            Cognition Arousal/Alertness: Awake/alert Behavior During Therapy: WFL for tasks assessed/performed Overall Cognitive Status: No family/caregiver present to determine baseline cognitive functioning Area of Impairment: Following commands;Safety/judgement;Problem solving  Current Attention Level: Selective   Following Commands: Follows one step commands with increased time;Follows one step commands consistently Safety/Judgement: Decreased awareness of safety    Problem Solving: Decreased initiation;Requires verbal cues;Difficulty sequencing General Comments: pt asking "how hard is it to relearn how to walk?" pt appears more motivated to engage in therapy sessions      Exercises      General Comments General comments (skin integrity, edema, etc.): incontinent of stool so assist for hygiene then had continent stool as reported felt like going when rolled back to side and placed bedpan      Pertinent Vitals/Pain Pain Assessment: Faces Faces Pain Scale: Hurts even more Pain Location: LLE and R big toe Pain Descriptors / Indicators: Discomfort;Grimacing;Guarding Pain Intervention(s): Monitored during session;Limited activity within patient's tolerance    Home Living                      Prior Function            PT Goals (current goals can now be found in the care plan section) Acute Rehab PT Goals Patient Stated Goal: to get better/walk PT Goal Formulation: With patient Time For Goal Achievement: 09/11/21 Potential to Achieve Goals: Fair Progress towards PT goals: Progressing toward goals    Frequency    Min 2X/week      PT Plan Current plan remains appropriate    Co-evaluation   Reason for Co-Treatment: Complexity of the patient's impairments (multi-system involvement);To address functional/ADL transfers;For patient/therapist safety   OT goals addressed during session: ADL's and self-care      AM-PAC PT "6 Clicks" Mobility   Outcome Measure  Help needed turning from your back to your side while in a flat bed without using bedrails?: A Lot Help needed moving from lying on your back to sitting on the side of a flat bed without using bedrails?: A Lot Help needed moving to and from a bed to a chair (including a wheelchair)?: Total Help needed standing up from a chair using your arms (e.g., wheelchair or bedside chair)?: Total Help needed to walk in hospital room?: Total Help needed climbing 3-5 steps with a  railing? : Total 6 Click Score: 8    End of Session Equipment Utilized During Treatment: Gait belt Activity Tolerance: Patient limited by fatigue Patient left: in chair;with call bell/phone within reach;with chair alarm set Nurse Communication: Mobility status;Need for lift equipment PT Visit Diagnosis: Other abnormalities of gait and mobility (R26.89);Muscle weakness (generalized) (M62.81);Difficulty in walking, not elsewhere classified (R26.2);Other (comment)     Time: 5697-9480 PT Time Calculation (min) (ACUTE ONLY): 44 min  Charges:  $Therapeutic Activity: 23-37 mins                     Magda Kiel, PT Acute Rehabilitation Services XKPVV:[phone] Office:[phone] 08/28/2021    Reginia Naas 08/28/2021, 4:28 PM

## 2021-08-28 NOTE — Progress Notes (Signed)
Nutrition Follow-up  DOCUMENTATION CODES:  Not applicable  INTERVENTION:  -Continue TPN management per Pharmacy -Boost Breeze po TID, each supplement provides 250 kcal and 9 grams of protein -Once diet advances to at least full liquids, discontinue Boost Breeze and order Ensure Enlive po BID, each supplement provides 350 kcal and 20 grams of protein  NUTRITION DIAGNOSIS:  Inadequate oral intake related to altered GI function, acute illness as evidenced by NPO status. -- ongoing  GOAL:  Patient will meet greater than or equal to 90% of their needs -- addressing with TPN and supplements  MONITOR:  Labs, Weight trends, Skin, I & O's, Other (Comment) (TPN)  REASON FOR ASSESSMENT:  Consult Assessment of nutrition requirement/status, New TPN/TNA  ASSESSMENT:  75 year old female who presented to the ED on 8/24 s/p CPR. Pt with recent admission from 8/09 to 8/23 with anemia from GI bleed secondary to cecal mass s/p R hemicolectomy with primary anastomosis on 07/06/21. 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 Pt also found to be positive for COVID-19 on admission. PMH of atrial fibrillation, CHF, HTN, lymphedema, mitral regurgitation. Pt admitted with septic shock with abdominal wall fluid collection and concern for necrotizing fasciitis.  9/11- diet advanced to clear liquids 9/13 - diet advanced to full liquids 9/15 - Eakin's pouch changed 9/21 - pt made NPO due to concern that fistula not completely closed 10/03 - diet advanced to clear liquids  Pt considerably more alert/awake today and endorses some pain around fistula site. reports wanting to eat. Surgery advanced pt to clear liquid diet yesterday, though no PO intake documented since advancement. Will provide pt with oral nutrition supplements and monitor for further diet advancement. Per Pharmacy, TPN to be cycled over 14 hours at goal volume of 2575ml/24 hours to provide 150 grams protein and 2362 kcals and no  changes are being made to TPN today.   UOP: 538mL x24 hours Fistula output: 70ml x24 hours I/O: +9.4L since admit  Per RN edema assessment, pt w/ moderate pitting edema to LUE and deep pitting edema to BLE.   Labs reviewed. CBGs 73-90 x 24 hours Medications: Protonix  Diet Order:   Diet Order             Diet clear liquid Room service appropriate? Yes; Fluid consistency: Thin  Diet effective now                  EDUCATION NEEDS:  Not appropriate for education at this time  Skin:  Skin Assessment: Skin Integrity Issues: Skin Integrity Issues:: Other (Comment) Other: pressure injury to coccyx, pressure injury to R thigh, Eakins pouch to abdomen, MASD to bilateral buttocks  Last BM:  10/3  Height:  Ht Readings from Last 1 Encounters:  07/18/21 5\' 2"  (1.575 m)   Weight:  Wt Readings from Last 1 Encounters:  08/27/21 (!) 140.1 kg   Ideal Body Weight:  50 kg  BMI:  Body mass index is 56.49 kg/m.  Estimated Nutritional Needs:  Kcal:  2330-0762 Protein:  140-160 grams Fluid:  >/= 2.0 L    Larkin Ina, MS, RD, LDN (she/her/hers) RD pager number and weekend/on-call pager number located in [place].

## 2021-08-28 NOTE — Progress Notes (Signed)
[facility] PROGRESS NOTE  Lauren [name] OYD:[id number] DOB: 04-11-1946 DOA: 07/18/2021 PCP: Pcp, No  Status: Remains inpatient appropriate because:Unsafe d/c plan and IV treatments appropriate due to intensity of illness or inability to take PO  Dispo: The patient is from: Home              Anticipated d/c is to: LTAC vs Home w/ Hospice or SNF w/ Hospice              Patient currently is medically stable to d/c.   Difficult to place patient Yes                Barriers to discharge:  Level of care: Med-Surg   Code Status: DNR Family Communication: No family at bedside DVT prophylaxis: Lovenox COVID vaccination status: Unknown    HPI: 75 year old female patient who recently underwent right hemicolectomy on 07/06/2021 and was found to have metastatic adenoma of the right colon at home she was having abdominal pain, nausea and right-sided back pain.  She presented to [facility] with sepsis physiology that was unresponsive to fluid challenge and was placed on vasopressors.  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  She was incidentally found to be COVID-positive at time of admission.  Due to the abnormal CT findings she was treated with broad-spectrum antibiotics and general surgery was consulted. The patient was transferred to [facility].  She eventually stabilized enough to move out of the ICU and was transferred to hospitalist team on 8/27.  Follow-up CT on 9/17 with overall improvement and no signs of persistent communication of the bowel lumen and the wound in the abdomen.  She remains n.p.o. and on parenteral nutrition.  As documented fistula can take 8 to 9 weeks to close once fistula can become chronic.  Subjective: Much more awake today.  Reporting some pain around fistula site.  Much more alert today.  Very concerned over pending mortgage payment stating she does not wish to lose her house.  Asked [place]  questions regarding the term facility and we explained this would be a nursing home for rehabilitation.  Objective: Vitals:   08/28/21 0023 08/28/21 0453  BP: (!) 112/52 (!) 97/54  Pulse: 88 99  Resp: 17 18  Temp:  98.1 F (36.7 C)  SpO2:  96%    Intake/Output Summary (Last 24 hours) at 08/28/2021 0801 Last data filed at 08/27/2021 2131 Gross per 24 hour  Intake 0 ml  Output 540 ml  Net -540 ml   Filed Weights   08/18/21 0410 08/25/21 0500 08/27/21 0423  Weight: (!) 151 kg (!) 141 kg (!) 140.1 kg    Exam:  Constitutional: NAD, calm, comfortable Respiratory: clear to auscultation bilaterally, no wheezing, no crackles. Normal respiratory effort. Room air, pulse oximetry the 6%. Cardiovascular: Regular rate and rhythm, no murmurs / rubs / gallops.  Soft bilateral lower extremity edema.  Normotensive Abdomen: Tender around the fistula site, pouch has been placed over large colocutaneous fistula area with purulent and green bilious appearing returns noted. Bowel sounds positive.  Neurologic: CN 2-12 grossly intact. Sensation intact, DTR normal. Strength 3-4/5 x all 4 extremities.  Psychiatric: Alert and oriented x3.  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 New diagnosis of metastatic colon adenocarcinoma. Completed IV Zosyn on 9/6 Although recent CT on 9/17 encouraging since there remains air in output from the  Eakin's pouch surgery is concerned that the fistula has not completely closed Palliative team consultation pending SNF previously have denied patient due to cost of TPN but [facility] will review her chart Pain control with hydromorphone and oxycodone.    Left thigh hematoma, with acute blood loss anemia.  Resolved sp 4 units PRBC transfusion during this hospitalization.   Hemoglobin is stable between 7.9 and 8.8   Atrial fibrillation with RVR.  Currently rate  controlled-due to hypotension preadmission amiodarone and metoprolol Recent GI bleed therefore will utilize prophylactic enoxaparin dose   Combined chronic systolic and diastolic heart failure.  LV systolic function 16%, w/diastolic dysfunction 5 years ago echocardiogram was normal with EF of 55 to 60% and no evidence of diastolic dysfunction or other abnormality Chronic lower extremity edema and lymphedema remain    AKI, hyponatremia, hypokalemia, hypomagnesemia, hypochloremia.  Resolved   Obesity class 3/severe protein calorie malnutrition/hypoalbuminemia Body mass index is 56.49 kg/m.  Has hypoalbuminemia with serum albumin less than 1.6 Nutrition Problem: Inadequate oral intake Etiology: altered GI function, acute illness Signs/Symptoms: NPO status Interventions: TPN   Coccyx and thigh pressure ulcer  Nursing has documented resolved as of 9/25  Physical debility PT documents making slow progress towards goals and needs significant encouragement. Morbid obesity also contributing to difficulty with ambulating and repositioning self in bed Recommendation is for SNF   Metabolic encephalopathy with delirium Resolved  Anxiety and depression,  Continue with citalopram and PRN lorazepam.   Hypothyroidism Continue levothyroxine    COVID 19 infection, Was completely asymptomatic without any pneumonia or other significant abnormality Was positive on 8/24 and has completed quarantine     Data Reviewed: Basic Metabolic Panel: Recent Labs  Lab 08/22/21 0149 08/23/21 0448 08/24/21 0220 08/25/21 0500 08/26/21 0358 08/27/21 0601  NA 134* 136 138 141 140 135  K 4.5 4.5 4.1 3.8 3.7 4.0  CL 109 111 112* 108 102 101  CO2 22 22 21* 27 32 30  GLUCOSE 103* 98 89 88 70 85  BUN 20 23 25* 32* 35* 31*  CREATININE 0.46 0.50 0.56 0.58 0.60 0.52  CALCIUM 7.7* 7.8* 7.9* 8.1* 7.9* 7.9*  MG 2.0 2.0  --  2.0 2.0 2.2  PHOS 3.7 3.5  --  4.9* 4.7* 3.3   Liver Function Tests: Recent Labs   Lab 08/23/21 0448 08/27/21 0601  AST 32 25  ALT 27 20  ALKPHOS 116 98  BILITOT 0.3 0.4  PROT 5.3* 5.5*  ALBUMIN 1.6* 1.6*    CBC: Recent Labs  Lab 08/22/21 0149 08/26/21 0645  WBC 5.9 5.4  NEUTROABS  --  3.2  HGB 8.8* 7.9*  HCT 30.2* 26.5*  MCV 93.5 91.1  PLT 201 140*    CBG: Recent Labs  Lab 08/27/21 0419 08/27/21 0736 08/27/21 1205 08/27/21 1623 08/28/21 0755  GLUCAP 98 114* 73 78 90      Scheduled Meds:  amiodarone  200 mg Oral Daily   Chlorhexidine Gluconate Cloth  6 each Topical Daily   citalopram  20 mg Oral Daily   enoxaparin (LOVENOX) injection  0.5 mg/kg Subcutaneous Q24H   levothyroxine  150 mcg Oral Q0600   loratadine  10 mg Oral Daily   mouth rinse  15 mL Mouth Rinse BID   metoprolol tartrate  12.5 mg Oral BID   pantoprazole sodium  40 mg Oral Q0600   sodium chloride flush  10-40 mL Intracatheter Q12H   sodium chloride flush  10-40 mL Intracatheter Q12H   Continuous Infusions:  TPN CYCLIC-ADULT (ION) 87 mL/hr at 08/28/21 0711    Principal Problem:   Septic shock ([place]) Active Problems:   Hypokalemia   Morbid obesity with BMI of 50.0-59.9, adult ([place])   Colon carcinoma ([place])   Enterocutaneous fistula   Cellulitis   AKI (acute kidney injury) ([place])   Hyponatremia   Abdominal pain   Consultants: General surgery  Procedures: Double lumen PICC placed on 9/26 to RUE  Antibiotics: Anidulafungin x1 dose on 8/24 Cefepime x1 dose on 8/24 Rocephin x1 dose on 8/24 Meropenem x1 dose on 8/24 Flagyl x1 dose on 8/24 Zosyn 8/24 through 9/6 Remdesivir x3 doses 8/25 through 8/27   Time spent: 25 minutes    Erin Hearing Lauren [name]  [facility] 7 am - 330 pm/M-F for direct patient care and secure chat Please refer to Amion for contact info 41  days

## 2021-08-28 NOTE — Progress Notes (Signed)
CSW faxed patient's clinicals to [place] at [facility] for review.  Madilyn Fireman, MSW, LCSW Transitions of Care  Clinical Social Worker II [phone]

## 2021-08-28 NOTE — Progress Notes (Signed)
Palliative Medicine Inpatient Follow Up Note     Chart Reviewed. Patient assessed at the bedside.   Patient resting but easily awakened. Denies pain or shortness of breath. States she is fatigue. No acute distress noted.   We re-visited goals of care discussion. I discussed at length current plan of care and the amount of time expected to see if healing is or will take place with consideration to her underlying cancer. Best case and worst case scenarios discussed in addition to continued NPO status.   Kimela verbalizes her understanding of her current illness and co-morbidities. She shares although she knows she has been through a lot with her health over the past year she is not ready to "throw in the towel". Ms. Cranmer speaks of concerns regarding management of her home needs (bills and finances). Support provided. She is hopeful her brother has made some arrangements on her behalf.   Patient is clear in her expressed wishes despite wanting to eat and understanding of illness/poor long-term prognosis that she wishes to continue to treat the treatable and allow time for outcomes. She states she is hopeful for SNF placement. Polette does acknowledge in the future if she does not improve or decline more significantly in her health she would not wish to be kept alive by ventilation. Confirms DNR/DNI and no life-sustaining measures outside of current artificial nutrition. She is willing to allow herself time and will continue to evaluate her wishes as she feels appropriate. States she is grateful for occasional ice chips and sips.   Discussed the importance of continued conversation with family and their  medical providers regarding overall plan of care and treatment options, ensuring decisions are within the context of the patients values and GOCs.  Questions addressed and support provided.    Objective Assessment: Vital Signs Vitals:   08/28/21 0023 08/28/21 0453  BP: (!) 112/52 (!) 97/54   Pulse: 88 99  Resp: 17 18  Temp:  98.1 F (36.7 C)  SpO2:  96%    Intake/Output Summary (Last 24 hours) at 08/28/2021 1245 Last data filed at 08/27/2021 2131 Gross per 24 hour  Intake --  Output 540 ml  Net -540 ml   Last Weight  Most recent update: 08/27/2021  4:25 AM    Weight  140.1 kg (308 lb 13.8 oz)              Gen:  NAD, obese, ill appearing  HEENT: moist mucous membranes CV: Regular rate and rhythm, no murmurs rubs or gallops PULM: clear to auscultation bilaterally. No wheezes/rales/rhonchi ABD: soft/tender/pouch in place/normal bowel sounds EXT: bilateral lower extremity lymphedema  Neuro: Alert and oriented x3, mood appropriate   SUMMARY OF RECOMMENDATIONS   Continue with current plan of care per medical team. Ms. Edwin is clear in her expressed wishes to continue with all treatment allowing her every opportunity to heal.  PMT will continue to support and follow on as needed basis. Patient's goals remain set and clear. PMT may not see daily. Please secure chat for urgent needs.   Time Total: 45 min.   Visit consisted of counseling and education dealing with the complex and emotionally intense issues of symptom management and palliative care in the setting of serious and potentially life-threatening illness.Greater than 50%  of this time was spent counseling and coordinating care related to the above assessment and plan.  Alda Lea, AGPCNP-BC  Palliative Medicine Team [phone]  Palliative Medicine Team providers are available by phone from [place]  to 7pm daily and can be reached through the team cell phone. Should this patient require assistance outside of these hours, please call the patient's attending physician.

## 2021-08-28 NOTE — TOC Progression Note (Signed)
Transition of Care [facility]) - Progression Note    Patient Details  Name: LATROYA NG MRN: [id number] Date of Birth: [birth year]  Transition of Care [facility]) CM/SW [place], RN Phone Number: 08/28/2021, 12:33 PM  Clinical Narrative:    Case management met with the patient at the bedside for transitions of care discussion.  The patient states that she is willing to transfer for a SNF short term and then discharge to his home.  The patient states that she is worried about losing her home and needs assistance calling the bank to pay her mortgage.  CM plans to assist the patient with this matter or reach out to the family to assist the patient with this matter.  I called and spoke with Narda Rutherford, CM at [facility] and they are unable to offer a bed to the patient at this time since the facility has a COVID infection outbreak at this time.  CM called and spoke with Frederich Balding, [facility] and she will check with [facility] about bed availability.  CM and MSW with [facility] will continue to follow the patient for SNF placement.   Expected Discharge Plan: [place] Barriers to Discharge: Continued Medical Work up  Expected Discharge Plan and Services Expected Discharge Plan: [place] In-house Referral: Clinical Social Work Discharge Planning Services: CM Consult   Living arrangements for the past 2 months: Single Family Home                                       Social Determinants of Health (SDOH) Interventions    Readmission Risk Interventions Readmission Risk Prevention Plan 07/12/2021  Transportation Screening Complete  Home Care Screening Complete  Medication Review (RN CM) Complete  Some recent data might be hidden

## 2021-08-28 NOTE — Progress Notes (Signed)
Occupational Therapy Treatment Patient Details Name: Lauren [name] MRN: [id number] DOB: Sep 05, 1946 Today's Date: 08/28/2021   History of present illness Pt is a 75 y.o. female admitted from [facility] [facility] on 3/50/09 with hypotension and Covid (+). Pt with colocutaneous fistula, Afib and new CHf since admission. Pt also with abdominal wall cellulitis and left thigh hematoma requiring blood transfusion. Of note, recent admission (8/9-8/23) to [facility] with partially obstructing tumor in the ascending colon s/p R hemicolectomy 07/06/21. PMH includes A fib, morbid obesity, HTN, lymphedema, hypothyroidism.   OT comments  Pt received in bed, agreeable to OT/PT session. Pt incontinent of bowels upon arrival and required modA to roll and totalA for hygiene. Pt required modA+2 to progress to sitting EOB, she required modA+2 for stand-pivot transfer from EOB to recliner on pt's right side. While in recliner, pt completed grooming and UB washup after setup assistance. Pt is demonstrating improvement toward prior level functioning and her goals were update this date. Pt will continue to benefit from skilled OT services to maximize safety and independence with ADL/IADL and functional mobility. Will continue to follow acutely and progress as tolerated.      Recommendations for follow up therapy are one component of a multi-disciplinary discharge planning process, led by the attending physician.  Recommendations may be updated based on patient status, additional functional criteria and insurance authorization.    Follow Up Recommendations  SNF    Equipment Recommendations  Wheelchair (measurements OT);Wheelchair cushion (measurements OT)    Recommendations for Other Services      Precautions / Restrictions Precautions Precautions: Fall Precaution Comments: BLE lymphedema, ostomy, incontinence Restrictions Weight Bearing Restrictions: No       Mobility Bed Mobility Overal bed mobility: Needs Assistance Bed  Mobility: Rolling;Sidelying to Sit Rolling: Mod assist Sidelying to sit: Mod assist;+2 for physical assistance;+2 for safety/equipment       General bed mobility comments: pt with increased assistance with rolling, reaching for bilateral rails, due to edema in BLE, pt required moderate assistance for BLE management;pt required modA to progress trunk to upright position from sidelying and required modA for BLE management off the bed    Transfers Overall transfer level: Needs assistance Equipment used: 2 person hand held assist Transfers: Sit to/from [facility] Sit to Stand: Mod assist;+2 safety/equipment;+2 physical assistance Stand pivot transfers: Mod assist;+2 physical assistance;+2 safety/equipment       General transfer comment: modA to powerup into standing from EOB;She required modA for BUE support and stability while standing and increased assistance for weight shifting and cues to sequencing progression of BLE;Pt noted to progress LLE backwards > progression of RLE forward. Pt with limited knee flexion.    Balance Overall balance assessment: Needs assistance Sitting-balance support: Feet supported;Feet unsupported;No upper extremity supported;Single extremity supported Sitting balance-Leahy Scale: Fair Sitting balance - Comments: no instability noted   Standing balance support: Bilateral upper extremity supported Standing balance-Leahy Scale: Poor Standing balance comment: reliant on external support;increased assistance when weight shifting                           ADL either performed or assessed with clinical judgement   ADL Overall ADL's : Needs assistance/impaired     Grooming: Wash/dry hands;Wash/dry face;Set up;Sitting Grooming Details (indicate cue type and reason): performed in recliner Upper Body Bathing: Set up;Sitting Upper Body Bathing Details (indicate cue type and reason): performed in recliner         Lower Body  Dressing: Total assistance Lower Body Dressing Details (indicate cue type and reason): to don bilateral socks Toilet Transfer: Moderate assistance;+2 for physical assistance;+2 for safety/equipment Toilet Transfer Details (indicate cue type and reason): stand-pivot transfer from EOB to recliner Toileting- Clothing Manipulation and Hygiene: Maximal assistance Toileting - Clothing Manipulation Details (indicate cue type and reason): pt incontinent of bowels upon arrival and 1x during session, required maxA for posterior and pericare;pt assisting with rolling, able to notify therapists need for BM, was able to get pt on bedpan     Functional mobility during ADLs: Moderate assistance;+2 for physical assistance;+2 for safety/equipment General ADL Comments: Pt tolerated sitting EOB x5 min.     Vision       Perception     Praxis      Cognition Arousal/Alertness: Awake/alert Behavior During Therapy: WFL for tasks assessed/performed Overall Cognitive Status: Within Functional Limits for tasks assessed                                 General Comments: pt asking "how hard is it to relearn how to walk?" pt appears more motivated to engage in therapy sessions        Exercises     Shoulder Instructions       General Comments  Noted to be leaking from Eakin's pouch, notified RN.     Pertinent Vitals/ Pain       Pain Assessment: Faces Faces Pain Scale: Hurts even more Pain Location: LLE and R big toe Pain Descriptors / Indicators: Discomfort;Grimacing;Guarding Pain Intervention(s): Limited activity within patient's tolerance;Monitored during session  Home Living                                          Prior Functioning/Environment              Frequency  Min 2X/week        Progress Toward Goals  OT Goals(current goals can now be found in the care plan section)  Progress towards OT goals: Progressing toward goals  Acute Rehab OT  Goals Patient Stated Goal: to get better OT Goal Formulation: With patient Time For Goal Achievement: 09/11/21 Potential to Achieve Goals: Fair ADL Goals Pt Will Perform Grooming: with min assist;standing Pt Will Transfer to Toilet: with mod assist;stand pivot transfer;bedside commode Additional ADL Goal #1: Pt will complete bed mobility with 1 person assistance to reduce caregiver burden. Additional ADL Goal #2: Pt will tolerate standing 3-34min for completion of ADL.  Plan Discharge plan remains appropriate    Co-evaluation    PT/OT/SLP Co-Evaluation/Treatment: Yes Reason for Co-Treatment: Complexity of the patient's impairments (multi-system involvement);To address functional/ADL transfers;For patient/therapist safety   OT goals addressed during session: ADL's and self-care      AM-PAC OT "6 Clicks" Daily Activity     Outcome Measure   Help from another person eating meals?: A Little Help from another person taking care of personal grooming?: A Little Help from another person toileting, which includes using toliet, bedpan, or urinal?: A Lot Help from another person bathing (including washing, rinsing, drying)?: A Lot Help from another person to put on and taking off regular upper body clothing?: A Lot Help from another person to put on and taking off regular lower body clothing?: Total 6 Click Score: 13    End of Session Equipment  Utilized During Treatment: Gait belt  OT Visit Diagnosis: Other abnormalities of gait and mobility (R26.89);Muscle weakness (generalized) (M62.81);Other symptoms and signs involving cognitive function;Pain;Adult, failure to thrive (R62.7) Pain - Right/Left: Left Pain - part of body: Hip   Activity Tolerance Patient tolerated treatment well   Patient Left in chair;with call bell/phone within reach;with chair alarm set;Other (comment) (with hoyer pad underneath her)   Nurse Communication Need for lift equipment;Mobility status        Time:  9030-0923 OT Time Calculation (min): 44 min  Charges: OT General Charges $OT Visit: 1 Visit OT Treatments $Self Care/Home Management : 8-22 mins  Helene Kelp OTR/L Acute Rehabilitation Services Office: [place] 08/28/2021, 2:05 PM

## 2021-08-29 DIAGNOSIS — R6521 Severe sepsis with septic shock: Secondary | ICD-10-CM | POA: Diagnosis not present

## 2021-08-29 DIAGNOSIS — A419 Sepsis, unspecified organism: Secondary | ICD-10-CM | POA: Diagnosis not present

## 2021-08-29 LAB — GLUCOSE, CAPILLARY
Glucose-Capillary: 106 mg/dL — ABNORMAL HIGH (ref 70–99)
Glucose-Capillary: 109 mg/dL — ABNORMAL HIGH (ref 70–99)
Glucose-Capillary: 64 mg/dL — ABNORMAL LOW (ref 70–99)
Glucose-Capillary: 81 mg/dL (ref 70–99)
Glucose-Capillary: 86 mg/dL (ref 70–99)
Glucose-Capillary: 88 mg/dL (ref 70–99)
Glucose-Capillary: 97 mg/dL (ref 70–99)
Glucose-Capillary: 99 mg/dL (ref 70–99)

## 2021-08-29 MED ORDER — TRAVASOL 10 % IV SOLN
INTRAVENOUS | Status: AC
  Filled 2021-08-29: qty 1500

## 2021-08-29 NOTE — Progress Notes (Signed)
CSW spoke with [place] at [facility] informed Alyse Low that patient only wants short term rehab and would require a bariatric bed. Christy to determine if patient has SNF days remaining and return call to [place].  Madilyn Fireman, MSW, LCSW Transitions of Care  Clinical Social Worker II [phone]

## 2021-08-29 NOTE — Progress Notes (Signed)
[facility] PROGRESS NOTE  Lauren [name] [id number]:[id number] DOB: 1946-09-06 DOA: 07/18/2021 PCP: Pcp, No  Status: Remains inpatient appropriate because:Unsafe d/c plan and IV treatments appropriate due to intensity of illness or inability to take PO  Dispo: The patient is from: Home              Anticipated d/c is to: LTAC vs Home w/ Hospice or SNF w/ Hospice              Patient currently is medically stable to d/c.   Difficult to place patient Yes                Barriers to discharge:  Level of care: Med-Surg   Code Status: DNR Family Communication: No family at bedside DVT prophylaxis: Lovenox COVID vaccination status: Unknown    HPI: 75 year old female patient who recently underwent right hemicolectomy on 07/06/2021 and was found to have metastatic adenoma of the right colon at home she was having abdominal pain, nausea and right-sided back pain.  She presented to [facility] with sepsis physiology that was unresponsive to fluid challenge and was placed on vasopressors.  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  She was incidentally found to be COVID-positive at time of admission.  Due to the abnormal CT findings she was treated with broad-spectrum antibiotics and general surgery was consulted. The patient was transferred to [facility].  She eventually stabilized enough to move out of the ICU and was transferred to hospitalist team on 8/27.  Follow-up CT on 9/17 with overall improvement and no signs of persistent communication of the bowel lumen and the wound in the abdomen.  She remains n.p.o. and on parenteral nutrition.  As documented fistula can take 8 to 9 weeks to close once fistula can become chronic.  Subjective: Awakened from sleep.  She is excited and very encouraged by slowly improving mobility.  She states that she was placed in a recliner chair yesterday afternoon and was able to stay up until 10  PM last night.  Objective: Vitals:   08/28/21 2100 08/29/21 0416  BP: 111/67 117/62  Pulse: 92   Resp: 18 19  Temp: 97.7 F (36.5 C) 97.9 F (36.6 C)  SpO2: 94%     Intake/Output Summary (Last 24 hours) at 08/29/2021 0743 Last data filed at 08/29/2021 0600 Gross per 24 hour  Intake 2108.66 ml  Output 500 ml  Net 1608.66 ml   Filed Weights   08/25/21 0500 08/27/21 0423 08/29/21 0416  Weight: (!) 141 kg (!) 140.1 kg (!) 147.9 kg    Exam:  Constitutional: NAD, calm, comfortable Respiratory: There are lung sounds are clear to auscultation, stable on room air, no increased work of breathing, pulse oximetry read to 96% Cardiovascular: Sounds remain normal, regular pulse, normotensive, B LE lymphedema Abdomen: Soft and currently nontender.  Pouch over colocutaneous exit site continues with purulent and bilious appearing output.  No definite feculent output noted.  Continue on TPN.  Ice chips only. Neurologic: CN 2-12 grossly intact. Sensation intact, DTR normal. Strength 3-4/5 x all 4 extremities.  Psychiatric: Alert and oriented x3.  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 New diagnosis of metastatic colon adenocarcinoma. Completed IV Zosyn on 9/6 Although recent CT on 9/17 encouraging since there remains air in output from the Eakin's pouch surgery is concerned that the fistula has not completely  closed SNF previously have denied patient due to cost of TPN but other facilities reviewing chart Pain control with IV hydromorphone and oral oxycodone.    Left thigh hematoma, with acute blood loss anemia.  Resolved sp 4 units PRBCs Hemoglobin is stable between 7.9 and 8.8   Atrial fibrillation with RVR.  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  LV systolic function 16%, w/diastolic dysfunction 5 years ago echocardiogram was normal with EF of 55 to 60% and no evidence of diastolic dysfunction or other abnormality Given current debility not a candidate for aggressive ischemic evaluation Chronic lower extremity edema and lymphedema remain stable   AKI, hyponatremia, hypokalemia, hypomagnesemia, hypochloremia.  Resolved   Obesity class 3/severe protein calorie malnutrition/hypoalbuminemia Body mass index is 59.64 kg/m.  Has hypoalbuminemia with serum albumin less than 1.6 Nutrition Problem: Inadequate oral intake Etiology: altered GI function, acute illness Signs/Symptoms: NPO status Interventions: TPN   Coccyx and thigh pressure ulcer  Nursing has documented resolved as of 9/25  Physical debility PT documents making slow progress towards goals and needs significant encouragement. Morbid obesity also contributing to difficulty with ambulating and repositioning self in bed Current focus is on increasing mobility in the bed well is working on transfers to EOB and attempting to stand. Recommendation is for SNF   Metabolic encephalopathy with delirium Resolved  Anxiety and depression,  Continue with citalopram and PRN lorazepam.   Hypothyroidism Continue levothyroxine    COVID 19 infection, Was completely asymptomatic without any pneumonia or other significant abnormality Was positive on 8/24 and has completed quarantine     Data Reviewed: Basic Metabolic Panel: Recent Labs  Lab 08/23/21 0448 08/24/21 0220 08/25/21 0500 08/26/21 0358 08/27/21 0601  NA 136 138 141 140 135  K 4.5 4.1 3.8 3.7 4.0  CL 111 112* 108 102 101  CO2 22 21* 27 32 30  GLUCOSE 98 89 88 70 85  BUN 23 25* 32* 35* 31*  CREATININE 0.50 0.56 0.58 0.60 0.52  CALCIUM 7.8* 7.9* 8.1* 7.9* 7.9*  MG 2.0  --  2.0 2.0 2.2  PHOS 3.5  --  4.9* 4.7* 3.3   Liver Function Tests: Recent Labs  Lab 08/23/21 0448  08/27/21 0601  AST 32 25  ALT 27 20  ALKPHOS 116 98  BILITOT 0.3 0.4  PROT 5.3* 5.5*  ALBUMIN 1.6* 1.6*    CBC: Recent Labs  Lab 08/26/21 0645  WBC 5.4  NEUTROABS 3.2  HGB 7.9*  HCT 26.5*  MCV 91.1  PLT 140*    CBG: Recent Labs  Lab 08/28/21 1112 08/28/21 1556 08/28/21 1907 08/29/21 0020 08/29/21 0415  GLUCAP 77 97 84 109* 99      Scheduled Meds:  amiodarone  200 mg Oral Daily   Chlorhexidine Gluconate Cloth  6 each Topical Daily   citalopram  20 mg Oral Daily   enoxaparin (LOVENOX) injection  0.5 mg/kg Subcutaneous Q24H   feeding supplement  1 Container Oral TID BM   levothyroxine  150 mcg Oral Q0600   loratadine  10 mg Oral Daily   mouth rinse  15 mL Mouth Rinse BID   metoprolol tartrate  12.5 mg Oral BID   pantoprazole sodium  40 mg Oral Q0600   sodium chloride flush  10-40 mL Intracatheter Q12H   sodium chloride flush  10-40 mL Intracatheter Q12H   Continuous Infusions:  TPN CYCLIC-ADULT (  ION) 96 mL/hr at 95/63/87 5643   TPN CYCLIC-ADULT (ION)      Principal Problem:   Septic shock ([place]) Active Problems:   Hypokalemia   Morbid obesity with BMI of 50.0-59.9, adult ([place])   Colon carcinoma ([place])   Enterocutaneous fistula   Cellulitis   AKI (acute kidney injury) ([place])   Hyponatremia   Abdominal pain   Consultants: General surgery  Procedures: Double lumen PICC placed on 9/26 to RUE  Antibiotics: Anidulafungin x1 dose on 8/24 Cefepime x1 dose on 8/24 Rocephin x1 dose on 8/24 Meropenem x1 dose on 8/24 Flagyl x1 dose on 8/24 Zosyn 8/24 through 9/6 Remdesivir x3 doses 8/25 through 8/27   Time spent: 25 minutes    Erin Hearing ANP  [facility] 7 am - 330 pm/M-F for direct patient care and secure chat Please refer to Amion for contact info 42  days

## 2021-08-29 NOTE — Plan of Care (Addendum)
Status unchanged still pending placement pls refer to SW note. Pt requiring prn pain meds and zofran given 1x IV. 1 episode of asymptomatic hypoglycemia this afternoon. BG 64 recheck 81 post cola drink (per pt request) CHG bath completed. Skin unremarkable except previous lymphedema/cellulitis to LLE and blanchable redness to perineum due to body habitus and moisture. Skin care with cream and powder given.   Problem: Fluid Volume: Goal: Hemodynamic stability will improve Outcome: Progressing   Problem: Clinical Measurements: Goal: Diagnostic test results will improve Outcome: Progressing Goal: Signs and symptoms of infection will decrease Outcome: Progressing   Problem: Respiratory: Goal: Ability to maintain adequate ventilation will improve Outcome: Progressing   Problem: Education: Goal: Knowledge of General Education information will improve Description: Including pain rating scale, medication(s)/side effects and non-pharmacologic comfort measures Outcome: Progressing   Problem: Health Behavior/Discharge Planning: Goal: Ability to manage health-related needs will improve Outcome: Progressing   Problem: Clinical Measurements: Goal: Ability to maintain clinical measurements within normal limits will improve Outcome: Progressing Goal: Will remain free from infection Outcome: Progressing Goal: Diagnostic test results will improve Outcome: Progressing Goal: Respiratory complications will improve Outcome: Progressing Goal: Cardiovascular complication will be avoided Outcome: Progressing   Problem: Activity: Goal: Risk for activity intolerance will decrease Outcome: Progressing   Problem: Nutrition: Goal: Adequate nutrition will be maintained Outcome: Progressing   Problem: Coping: Goal: Level of anxiety will decrease Outcome: Progressing   Problem: Elimination: Goal: Will not experience complications related to bowel motility Outcome: Progressing Goal: Will not  experience complications related to urinary retention Outcome: Progressing   Problem: Pain Managment: Goal: General experience of comfort will improve Outcome: Progressing   Problem: Safety: Goal: Ability to remain free from injury will improve Outcome: Progressing   Problem: Skin Integrity: Goal: Risk for impaired skin integrity will decrease Outcome: Progressing   Problem: Education: Goal: Knowledge about tracheostomy care/management will improve Outcome: Progressing   Problem: Activity: Goal: Ability to tolerate increased activity will improve Outcome: Progressing   Problem: Health Behavior/Discharge Planning: Goal: Ability to manage tracheostomy will improve Outcome: Progressing   Problem: Respiratory: Goal: Patent airway maintenance will improve Outcome: Progressing   Problem: Role Relationship: Goal: Ability to communicate will improve Outcome: Progressing

## 2021-08-29 NOTE — Progress Notes (Signed)
PHARMACY - TOTAL PARENTERAL NUTRITION CONSULT NOTE  Indication: Fistula  Patient Measurements: Height: 5\' 2"  (157.5 cm) Weight: (!) 147.9 kg (326 lb 1 oz) IBW/kg (Calculated) : 50.1 TPN AdjBW (KG): 66.6 Body mass index is 59.64 kg/m.  Assessment:  Lauren [name] with recent R-hemicolectomy on 8/12 in the setting of a cecal mass - consistent with R-colon metastatic adenocarcinoma. 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 Pharmacy consulted to manage TPN for nutritional support in the setting of a colocutaneous fistula.    Glucose / Insulin: CBGs mostly <100 on TPN. No incidences of hypoglycemia requiring treatment. SSI d/c'ed   Electrolytes: Na 140>>135 (Na in TPN reduced 10/1), Cl 101, Phos 4.7>>3.3 (phos in TPN reduced 10/1), Mg 2.2 Other electrolytes wnl.  - No new labs 10/5.  Renal:  SCr stable, BUN 31 - receiving ~2g/kg/day protein (ABW).   Hepatic: LFTs WNL, tbili / TG WNL, albumin 1.6  Intake / Output; MIVF: UOP 0.1 ml/kg/hr, LBM 10/4.  Drain output: 0 recorded  GI Imaging:  8/24 CT: colocutaneous fistula, abd wall abscess, soft tissue edema, cellulitis vs nec fasciitis 8/25 Abd xray: diffuse abd wall emphysema, nonobstructive bowel gas pattern 9/4 CT: medial upper left thigh hematoma 9/17 CT: no evidence of communication between bowel and subQ tissue. Decrease volume extensive subcutaneous gas along the RIGHT abd wall and upper groin.  GI Surgeries / Procedures:  8/12 at [facility]: R-hemicolectomy 8/24: Midline wound opened by Dr. Arnoldo Morale at bedside  Central access: PICC replaced 07/27/21 TPN start date: 07/19/21  Nutritional Goals (RD assessment on 9/29): 2300-2500 kcal, 140-160g AA, fluid >/= 2L per day  Current Nutrition:  TPN - not hung 9/25 due to PICC tip not being in the right place. PICC to be exchanged 9/26. - 10/3 CLD: None charted - Boost TIDBM (2 charted 10/4), each supplement provides 250 kcal and 9 grams  of protein  Plan:  Cycle TPN over 14 hrs and monitor closely for hypoglycemia. Continue q4h CBG monitoring and obtain extra POCT CBG at 1400 - RN aware. If continues to remain stable.  Cycle TPN over 14 hours at goal volume of 2500 mL/24 hours, providing 150g AA 312g CHO and 71g ILE for a total of 2362 kCal, meeting 100% of estimated needs Electrolytes in TPN: Continue Na 100 mEq/L, K 72mEq/L, Ca 95mEq/L Mg 82mEq/L. Continue phos to 12 mmol/L.  Cl:Ac 2:1. (No changes 10/5 to TPN) Add standard MVI and trace elements to TPN Standard TPN labs on Mon and Thurs Follow up dispo plans: Medically ready for discharge.   Brewer Hitchman S. Alford Highland, PharmD, BCPS Clinical Staff Pharmacist [email or web address]

## 2021-08-30 DIAGNOSIS — A419 Sepsis, unspecified organism: Secondary | ICD-10-CM | POA: Diagnosis not present

## 2021-08-30 DIAGNOSIS — R6521 Severe sepsis with septic shock: Secondary | ICD-10-CM | POA: Diagnosis not present

## 2021-08-30 LAB — COMPREHENSIVE METABOLIC PANEL
ALT: 20 U/L (ref 0–44)
AST: 26 U/L (ref 15–41)
Albumin: 1.7 g/dL — ABNORMAL LOW (ref 3.5–5.0)
Alkaline Phosphatase: 116 U/L (ref 38–126)
Anion gap: 5 (ref 5–15)
BUN: 27 mg/dL — ABNORMAL HIGH (ref 8–23)
CO2: 24 mmol/L (ref 22–32)
Calcium: 7.8 mg/dL — ABNORMAL LOW (ref 8.9–10.3)
Chloride: 102 mmol/L (ref 98–111)
Creatinine, Ser: 0.57 mg/dL (ref 0.44–1.00)
GFR, Estimated: >60 mL/min (ref >60)
Glucose, Bld: 116 mg/dL — ABNORMAL HIGH (ref 70–99)
Potassium: 4.3 mmol/L (ref 3.5–5.1)
Sodium: 131 mmol/L — ABNORMAL LOW (ref 135–145)
Total Bilirubin: 0.5 mg/dL (ref 0.3–1.2)
Total Protein: 5.6 g/dL — ABNORMAL LOW (ref 6.5–8.1)

## 2021-08-30 LAB — GLUCOSE, CAPILLARY
Glucose-Capillary: 104 mg/dL — ABNORMAL HIGH (ref 70–99)
Glucose-Capillary: 108 mg/dL — ABNORMAL HIGH (ref 70–99)
Glucose-Capillary: 178 mg/dL — ABNORMAL HIGH (ref 70–99)

## 2021-08-30 LAB — MAGNESIUM: Magnesium: 2.1 mg/dL (ref 1.7–2.4)

## 2021-08-30 LAB — PHOSPHORUS: Phosphorus: 2.7 mg/dL (ref 2.5–4.6)

## 2021-08-30 MED ORDER — TRAVASOL 10 % IV SOLN
INTRAVENOUS | Status: AC
  Filled 2021-08-30: qty 1500

## 2021-08-30 MED ORDER — PANTOPRAZOLE 2 MG/ML SUSPENSION: 40.0000 mg | Freq: Two times a day (BID) | ORAL | Status: DC

## 2021-08-30 MED ORDER — IBUPROFEN 200 MG PO TABS
400.0000 mg | ORAL_TABLET | Freq: Four times a day (QID) | ORAL | Status: DC
  Administered 2021-08-30 – 2021-08-31 (×4): 400 mg via ORAL
  Filled 2021-08-30 (×4): qty 2

## 2021-08-30 MED ORDER — DEXAMETHASONE SODIUM PHOSPHATE 10 MG/ML IJ SOLN
8.0000 mg | Freq: Once | INTRAMUSCULAR | Status: AC
  Administered 2021-08-30: 8 mg via INTRAVENOUS
  Filled 2021-08-30: qty 1

## 2021-08-30 MED ORDER — PANTOPRAZOLE SODIUM 40 MG PO TBEC
40.0000 mg | DELAYED_RELEASE_TABLET | Freq: Two times a day (BID) | ORAL | Status: DC
  Administered 2021-08-30 – 2021-09-07 (×17): 40 mg via ORAL
  Filled 2021-08-30 (×16): qty 1

## 2021-08-30 NOTE — Progress Notes (Signed)
[facility] PROGRESS NOTE  Lauren [name] [id number]:[id number] DOB: 1946/07/28 DOA: 07/18/2021 PCP: Pcp, No  Status: Remains inpatient appropriate because:Unsafe d/c plan and IV treatments appropriate due to intensity of illness or inability to take PO  Dispo: The patient is from: Home              Anticipated d/c is to: [facility] healthcare center-insurance authorization pending              Patient currently is medically stable to d/c.   Difficult to place patient Yes                Barriers to discharge:  Level of care: Med-Surg   Code Status: DNR Family Communication: No family at bedside DVT prophylaxis: Lovenox COVID vaccination status: Unknown    HPI: 75 year old female patient who recently underwent right hemicolectomy on 07/06/2021 and was found to have metastatic adenoma of the right colon at home she was having abdominal pain, nausea and right-sided back pain.  She presented to [facility] with sepsis physiology that was unresponsive to fluid challenge and was placed on vasopressors.  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  She was incidentally found to be COVID-positive at time of admission.  Due to the abnormal CT findings she was treated with broad-spectrum antibiotics and general surgery was consulted. The patient was transferred to [facility].  She eventually stabilized enough to move out of the ICU and was transferred to hospitalist team on 8/27.  Follow-up CT on 9/17 with overall improvement and no signs of persistent communication of the bowel lumen and the wound in the abdomen.  She remains n.p.o. and on parenteral nutrition.  As documented fistula can take 8 to 9 weeks to close once fistula can become chronic.  Subjective: Alert.  Complaining of headache and insomnia overnight.  On exam patient is nontender to palpation of abdomen including around fistula site.  Bedside RN states patient  utilizing her narcotics regularly.  Objective: Vitals:   08/30/21 0337 08/30/21 0753  BP: (!) 112/59 (!) 101/41  Pulse: 86 77  Resp: 17 17  Temp: 97.6 F (36.4 C) 98.1 F (36.7 C)  SpO2: 98%     Intake/Output Summary (Last 24 hours) at 08/30/2021 0817 Last data filed at 08/30/2021 0548 Gross per 24 hour  Intake 2400.67 ml  Output 300 ml  Net 2100.67 ml   Filed Weights   08/27/21 0423 08/29/21 0416 08/30/21 0337  Weight: (!) 140.1 kg (!) 147.9 kg (!) 149.7 kg    Exam:  Constitutional: NAD, calm, comfortable Respiratory: Lungs are clear, normal respiratory rate, room air Cardiovascular: S1-S2, regular pulse, normotensive, B LE lymphedema Abdomen: Soft and currently nontender.  Pouch over colocutaneous site w/ purulent and bilious appearing output.  Cyclic TPN infused Neurologic: CN 2-12 grossly intact. Sensation intact, DTR normal. Strength 3-4/5 x all 4 extremities.  Reporting headache without visual disturbance Psychiatric: Alert and oriented x3.  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 New diagnosis of metastatic colon adenocarcinoma. Completed IV Zosyn on 9/6 Although recent CT on 9/17 encouraging since there remains air in output from the Eakin's pouch surgery is concerned that the fistula has not completely closed Patient is regularly using narcotics but on exam has no abdominal pain.  She is also complaining of a headache which may be rebound from the narcotics.  I will  discontinue the IV Dilaudid and the oxycodone and begin scheduled ibuprofen and increase PPI to twice daily-GFR is > 60 Tolerating clear liquids as ordered by surgical team   Left thigh hematoma, with acute blood loss anemia.  Resolved sp 4 units PRBCs Hemoglobin is stable between 7.9 and 8.8   Atrial fibrillation with RVR.  Rate controlled on amiodarone and low-dose metoprolol Recent GI bleed  therefore will utilize prophylactic enoxaparin dose   Progressive combined chronic systolic and diastolic heart failure.  LV systolic function 12%, w/diastolic dysfunction 5 years ago echocardiogram was normal with EF of 55 to 60% and no evidence of diastolic dysfunction or other abnormality Given current debility lymphedema remains stable   AKI, hyponatremia, hypokalemia, hypomagnesemia, hypochloremia.  Resolved   Obesity class 3/severe protein calorie malnutrition/hypoalbuminemia Body mass index is 60.36 kg/m.  Has hypoalbuminemia with serum albumin less than 1.6 Nutrition Problem: Inadequate oral intake Etiology: altered GI function, acute illness Signs/Symptoms: NPO status Interventions: TPN   Coccyx and thigh pressure ulcer  Nursing has documented resolved as of 9/25  Physical debility PT documents making slow progress towards goals and needs significant encouragement. Morbid obesity also contributing to difficulty with ambulating and repositioning self in bed Current focus is on increasing mobility in the bed well is working on transfers to EOB and attempting to stand. Recommendation is for SNF   Metabolic encephalopathy with delirium Resolved  Anxiety and depression,  Continue with citalopram and PRN lorazepam.   Hypothyroidism Continue levothyroxine    COVID 19 infection, Was completely asymptomatic without any pneumonia or other significant abnormality Was positive on 8/24 and has completed quarantine     Data Reviewed: Basic Metabolic Panel: Recent Labs  Lab 08/24/21 0220 08/25/21 0500 08/26/21 0358 08/27/21 0601 08/30/21 0545  NA 138 141 140 135 131*  K 4.1 3.8 3.7 4.0 4.3  CL 112* 108 102 101 102  CO2 21* 27 32 30 24  GLUCOSE 89 88 70 85 116*  BUN 25* 32* 35* 31* 27*  CREATININE 0.56 0.58 0.60 0.52 0.57  CALCIUM 7.9* 8.1* 7.9* 7.9* 7.8*  MG  --  2.0 2.0 2.2 2.1  PHOS  --  4.9* 4.7* 3.3 2.7   Liver Function Tests: Recent Labs  Lab  08/27/21 0601 08/30/21 0545  AST 25 26  ALT 20 20  ALKPHOS 98 116  BILITOT 0.4 0.5  PROT 5.5* 5.6*  ALBUMIN 1.6* 1.7*    CBC: Recent Labs  Lab 08/26/21 0645  WBC 5.4  NEUTROABS 3.2  HGB 7.9*  HCT 26.5*  MCV 91.1  PLT 140*    CBG: Recent Labs  Lab 08/29/21 1642 08/29/21 1751 08/29/21 1942 08/30/21 0003 08/30/21 0340  GLUCAP 81 86 106* 108* 104*      Scheduled Meds:  amiodarone  200 mg Oral Daily   Chlorhexidine Gluconate Cloth  6 each Topical Daily   citalopram  20 mg Oral Daily   enoxaparin (LOVENOX) injection  0.5 mg/kg Subcutaneous Q24H   feeding supplement  1 Container Oral TID BM   levothyroxine  150 mcg Oral Q0600   loratadine  10 mg Oral Daily   mouth rinse  15 mL Mouth Rinse BID   metoprolol tartrate  12.5 mg Oral BID   pantoprazole sodium  40 mg Oral Q0600   sodium chloride flush  10-40 mL Intracatheter Q12H   sodium chloride flush  10-40 mL Intracatheter Q12H   Continuous Infusions:  TPN CYCLIC-ADULT (ION)      Principal Problem:  Septic shock ([place]) Active Problems:   Hypokalemia   Morbid obesity with BMI of 50.0-59.9, adult ([place])   Colon carcinoma ([place])   Enterocutaneous fistula   Cellulitis   AKI (acute kidney injury) ([place])   Hyponatremia   Abdominal pain   Consultants: General surgery  Procedures: Double lumen PICC placed on 9/26 to RUE  Antibiotics: Anidulafungin x1 dose on 8/24 Cefepime x1 dose on 8/24 Rocephin x1 dose on 8/24 Meropenem x1 dose on 8/24 Flagyl x1 dose on 8/24 Zosyn 8/24 through 9/6 Remdesivir x3 doses 8/25 through 8/27   Time spent: 25 minutes    Erin Hearing ANP  [facility] 7 am - 330 pm/M-F for direct patient care and secure chat Please refer to Amion for contact info 43  days

## 2021-08-30 NOTE — Progress Notes (Signed)
PHARMACY - TOTAL PARENTERAL NUTRITION CONSULT NOTE  Indication: Fistula  Patient Measurements: Height: 5\' 2"  (157.5 cm) Weight: (!) 149.7 kg (330 lb 0.5 oz) IBW/kg (Calculated) : 50.1 TPN AdjBW (KG): 75 Body mass index is 60.36 kg/m.  Assessment:  70 YOF with recent R-hemicolectomy on 8/12 in the setting of a cecal mass - consistent with R-colon metastatic adenocarcinoma. 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 Pharmacy consulted to manage TPN for nutritional support in the setting of a colocutaneous fistula.    Glucose / Insulin: CBGs mostly <100 on TPN. One incident of hypoglycemia at 1605 (10/5). SSI d/c'ed  - s/p cola for hypoglycemia (CLD)  Electrolytes: Na 131 down, K+ 4.3, Phos 2.7 down, Mg 2.1.  Renal:  SCr stable <1, BUN 27  Hepatic: LFTs WNL, tbili / TG 53 WNL, albumin 1.7  Intake / Output; MIVF: UOP 0.2 ml/kg/hr, LBM 10/5.  - CLD 249ml, Boost x 1  GI Imaging:  8/24 CT: colocutaneous fistula, abd wall abscess, soft tissue edema, cellulitis vs nec fasciitis 8/25 Abd xray: diffuse abd wall emphysema, nonobstructive bowel gas pattern 9/4 CT: medial upper left thigh hematoma 9/17 CT: no evidence of communication between bowel and subQ tissue. Decrease volume extensive subcutaneous gas along the RIGHT abd wall and upper groin.  GI Surgeries / Procedures:  8/12 at [facility]: R-hemicolectomy 8/24: Midline wound opened by Dr. Arnoldo Morale at bedside  Central access: PICC replaced 07/27/21, exchanged 9/26. TPN start date: 07/19/21  Nutritional Goals (RD assessment on 9/29): 2300-2500 kcal, 140-160g AA, fluid >/= 2L per day  Current Nutrition:  - 14 hr Cyclic TPN. - 10/3 CLD - Boost TIDBM (each supplement provides 250 kcal and 9 grams of protein  Plan:  Cycle TPN over 14 hrs and monitor closely for hypoglycemia. Could potentially resume TPN around the clock to prevent hypoglycemia. Continue q4h CBG monitoring.  Cycle  TPN over 14 hours at goal volume of 2500 mL/24 hours, providing 150g AA 312g CHO and 71g ILE for a total of 2362 kCal, meeting 100% of estimated needs.  Electrolytes in TPN: Continue incr Na 140 mEq/L, K 47mEq/L, Ca 81mEq/L Mg 53mEq/L. Incr Phos 63mmol/L.  Cl:Ac 2:1. Add standard MVI and trace elements to TPN Standard TPN labs on Mon and Thurs Follow up dispo plans: Medically ready for discharge.   Lauren [name], PharmD, BCPS Clinical Staff Pharmacist [email or web address]

## 2021-08-30 NOTE — Progress Notes (Signed)
PT Cancellation Note  Patient Details Name: Lauren [name] MRN: [id number] DOB: 04-24-1946   Cancelled Treatment:    Reason Eval/Treat Not Completed: Patient declined, no reason specified. Pt refusing all therapy and despite education, encouragement and very direct education for need to move she refused.    Sandy Salaam Jenese Mischke 08/30/2021, 9:31 AM Bayard Males, PT Acute Rehabilitation Services Pager: [phone] Office: [phone]

## 2021-08-30 NOTE — Progress Notes (Signed)
Pt refusing mobility / turning q 2 hours, PT, or participation in care.  Offers multiple excuses to staff.  States just "wants to sleep".  Is taking pain medication regularly, which she states "doesn't work".  Has refused assistance with mobility consistently.

## 2021-08-31 DIAGNOSIS — A419 Sepsis, unspecified organism: Secondary | ICD-10-CM | POA: Diagnosis not present

## 2021-08-31 DIAGNOSIS — R6521 Severe sepsis with septic shock: Secondary | ICD-10-CM | POA: Diagnosis not present

## 2021-08-31 LAB — GLUCOSE, CAPILLARY
Glucose-Capillary: 102 mg/dL — ABNORMAL HIGH (ref 70–99)
Glucose-Capillary: 108 mg/dL — ABNORMAL HIGH (ref 70–99)
Glucose-Capillary: 124 mg/dL — ABNORMAL HIGH (ref 70–99)
Glucose-Capillary: 132 mg/dL — ABNORMAL HIGH (ref 70–99)
Glucose-Capillary: 142 mg/dL — ABNORMAL HIGH (ref 70–99)
Glucose-Capillary: 147 mg/dL — ABNORMAL HIGH (ref 70–99)
Glucose-Capillary: 186 mg/dL — ABNORMAL HIGH (ref 70–99)

## 2021-08-31 MED ORDER — TRAVASOL 10 % IV SOLN
INTRAVENOUS | Status: AC
  Filled 2021-08-31: qty 1500

## 2021-08-31 MED ORDER — OXYCODONE HCL 5 MG PO TABS
5.0000 mg | ORAL_TABLET | ORAL | Status: AC | PRN
Start: 2021-08-31 — End: 2021-09-02
  Administered 2021-08-31: 5 mg via ORAL
  Filled 2021-08-31: qty 1

## 2021-08-31 MED ORDER — ENSURE ENLIVE PO LIQD
237.0000 mL | Freq: Three times a day (TID) | ORAL | Status: DC
  Administered 2021-08-31 – 2021-09-07 (×16): 237 mL via ORAL
  Filled 2021-08-31: qty 237

## 2021-08-31 MED ORDER — ONDANSETRON HCL 4 MG/2ML IJ SOLN
4.0000 mg | Freq: Four times a day (QID) | INTRAMUSCULAR | Status: DC
  Administered 2021-08-31: 4 mg via INTRAVENOUS
  Filled 2021-08-31: qty 2

## 2021-08-31 NOTE — Progress Notes (Signed)
PHARMACY - TOTAL PARENTERAL NUTRITION CONSULT NOTE  Indication: Fistula  Patient Measurements: Height: 5\' 2"  (157.5 cm) Weight: (!) 151.9 kg (334 lb 14.1 oz) IBW/kg (Calculated) : 50.1 TPN AdjBW (KG): 75 Body mass index is 61.25 kg/m.  Assessment:  47 YOF with recent R-hemicolectomy on 8/12 in the setting of a cecal mass - consistent with R-colon metastatic adenocarcinoma. 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 Pharmacy consulted to manage TPN for nutritional support in the setting of a colocutaneous fistula.    Glucose / Insulin: CBGs unusually high 104-186 last 24 on TPN. Previously most all <100. One incident of hypoglycemia (10/5). SSI d/c'ed (9/28).  Electrolytes: Na 131 down, K+ 4.3, Phos 2.7 down, Mg 2.1. No new labs 10/7  Renal:  SCr stable <1, BUN 27  Hepatic: LFTs WNL, tbili / TG 53 WNL, albumin 1.7  Intake / Output; MIVF: UOP 0.3 ml/kg/hr, LBM 10/6, Fistula OP 15ml.  - Advanced FLD 366ml, Boost x 1  GI Imaging:  8/24 CT: colocutaneous fistula, abd wall abscess, soft tissue edema, cellulitis vs nec fasciitis 8/25 Abd xray: diffuse abd wall emphysema, nonobstructive bowel gas pattern 9/4 CT: medial upper left thigh hematoma 9/17 CT: no evidence of communication between bowel and subQ tissue. Decrease volume extensive subcutaneous gas along the RIGHT abd wall and upper groin.  GI Surgeries / Procedures:  8/12 at [facility]: R-hemicolectomy 8/24: Midline wound opened by Dr. Arnoldo Morale at bedside  Central access: PICC replaced 07/27/21, exchanged 9/26. TPN start date: 07/19/21  Nutritional Goals (RD assessment on 9/29): 2300-2500 kcal, 140-160g AA, fluid >/= 2L per day  Current Nutrition:  - 14 hr Cyclic TPN. - 10/6: FLD - Boost TIDBM (each supplement provides 250 kcal and 9 grams of protein  Plan:   No change to TPN formula 10/7 Cycle TPN over 14 hrs. Continue q4h CBG monitoring. Previously hypoglycemia, now  f/u need to resume SSI.  Cycle TPN over 14 hours at goal volume of 2500 mL/24 hours, providing 150g AA 312g CHO and 71g ILE for a total of 2362 kCal, meeting 100% of estimated needs.  Electrolytes in TPN: Continue incr Na 140 mEq/L, K 1mEq/L, Ca 29mEq/L Mg 39mEq/L. Incr Phos 64mmol/L.  Cl:Ac 2:1. Add standard MVI and trace elements to TPN Standard TPN labs on Mon and Thurs Follow up dispo plans: Medically ready for discharge.   Quay Simkin S. Alford Highland, PharmD, BCPS Clinical Staff Pharmacist [email or web address]

## 2021-08-31 NOTE — Progress Notes (Signed)
[facility] PROGRESS NOTE  Lauren [name] URK:[id number] DOB: 06-25-1946 DOA: 07/18/2021 PCP: Pcp, No  Status: Remains inpatient appropriate because:Unsafe d/c plan and IV treatments appropriate due to intensity of illness or inability to take PO  Dispo: The patient is from: Home              Anticipated d/c is to: [facility] healthcare center-insurance authorization pending              Patient currently is medically stable to d/c.   Difficult to place patient Yes                Barriers to discharge:  Level of care: Med-Surg   Code Status: DNR Family Communication: No family at bedside DVT prophylaxis: Lovenox COVID vaccination status: Unknown    HPI: 75 year old female patient who recently underwent right hemicolectomy on 07/06/2021 and was found to have metastatic adenoma of the right colon at home she was having abdominal pain, nausea and right-sided back pain.  She presented to [facility] with sepsis physiology that was unresponsive to fluid challenge and was placed on vasopressors.  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  She was incidentally found to be COVID-positive at time of admission.  Due to the abnormal CT findings she was treated with broad-spectrum antibiotics and general surgery was consulted. The patient was transferred to [facility].  She eventually stabilized enough to move out of the ICU and was transferred to hospitalist team on 8/27.  Follow-up CT on 9/17 with overall improvement and no signs of persistent communication of the bowel lumen and the wound in the abdomen.  She remains n.p.o. and on parenteral nutrition.  As documented fistula can take 8 to 9 weeks to close once fistula can become chronic.  Subjective: Patient complaining of nausea.  Intermittent headache.  Form she was getting scheduled pain medications.  She stated that she did not want to get "hooked" on pain  medication.  I informed her that since she was significantly improved that I had discontinued all narcotic pain medications yesterday.  I also spoke to her at length regarding need to participate with therapies as well as get up to the chair regularly and attempt to walk to avoid permanent placement in a skilled nursing facility or being bedbound after discharge to home.  Objective: Vitals:   08/30/21 2116 08/31/21 0644  BP: (!) 102/54 109/63  Pulse: 66 77  Resp: 19 20  Temp: 97.9 F (36.6 C) 97.7 F (36.5 C)  SpO2: 96% 95%    Intake/Output Summary (Last 24 hours) at 08/31/2021 0734 Last data filed at 08/31/2021 0425 Gross per 24 hour  Intake 2005.07 ml  Output 1255 ml  Net 750.07 ml   Filed Weights   08/29/21 0416 08/30/21 0337 08/31/21 0500  Weight: (!) 147.9 kg (!) 149.7 kg (!) 151.9 kg    Exam:  Constitutional: NAD, calm, comfortable except for reports of nausea Respiratory: Anterior lung sounds are clear to auscultation, stable on room air, regular respiratory rate and no increased work of breathing Cardiovascular: S1-S2, he remains normotensive with a regular pulse, chronic bilateral lower extremity lymphedema stable Abdomen: Soft and currently nontender.  Pouch over colocutaneous site w/ purulent and bilious appearing output.  Cyclic TPN-diet advanced to full liquids on 10/6 Neurologic: CN 2-12 grossly intact. Sensation intact, DTR normal. Strength 3-4/5 x all 4 extremities.  Reporting headache without visual disturbance Psychiatric: Alert  and oriented x3.  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 New diagnosis of metastatic colon adenocarcinoma. Completed IV Zosyn on 9/6 Although recent CT on 9/17 encouraging since there remains air in output from the Eakin's pouch surgery is concerned that the fistula has not completely closed Continue scheduled ibuprofen with twice  daily Protonix Scheduled Zofran for nausea as diet is advanced Surgical team states will advance to soft diet today.  If able to maintain adequate caloric intake plan is to discontinue TPN.  Calorie count initiated Continue boost breeze supplementation.   Left thigh hematoma, with acute blood loss anemia.  Resolved sp 4 units PRBCs Hemoglobin is stable between 7.9 and 8.8   Atrial fibrillation with RVR.  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  LV systolic function 62%, w/diastolic dysfunction 5 years ago echocardiogram was normal with EF of 55 to 60% and no evidence of diastolic dysfunction or other abnormality Given current debility lymphedema remains stable   AKI, hyponatremia, hypokalemia, hypomagnesemia, hypochloremia.  Resolved   Obesity class 3/severe protein calorie malnutrition/hypoalbuminemia Body mass index is 61.25 kg/m.  Has hypoalbuminemia with serum albumin less than 1.6 Nutrition Problem: Inadequate oral intake Etiology: altered GI function, acute illness Signs/Symptoms: NPO status Interventions: TPN   Coccyx and thigh pressure ulcer  Nursing has documented resolved as of 9/25  Physical debility PT documents making slow progress towards goals and needs significant encouragement. Morbid obesity also contributing to difficulty with ambulating and repositioning self in bed Current focus is on increasing mobility in the bed well is working on transfers to EOB and attempting to stand. Recommendation is for SNF   Metabolic encephalopathy with delirium Resolved  Anxiety and depression,  Continue with citalopram and PRN lorazepam.   Hypothyroidism Continue levothyroxine    COVID 19 infection, Was completely asymptomatic without any pneumonia or other significant abnormality Was positive on 8/24 and has  completed quarantine     Data Reviewed: Basic Metabolic Panel: Recent Labs  Lab 08/25/21 0500 08/26/21 0358 08/27/21 0601 08/30/21 0545  NA 141 140 135 131*  K 3.8 3.7 4.0 4.3  CL 108 102 101 102  CO2 27 32 30 24  GLUCOSE 88 70 85 116*  BUN 32* 35* 31* 27*  CREATININE 0.58 0.60 0.52 0.57  CALCIUM 8.1* 7.9* 7.9* 7.8*  MG 2.0 2.0 2.2 2.1  PHOS 4.9* 4.7* 3.3 2.7   Liver Function Tests: Recent Labs  Lab 08/27/21 0601 08/30/21 0545  AST 25 26  ALT 20 20  ALKPHOS 98 116  BILITOT 0.4 0.5  PROT 5.5* 5.6*  ALBUMIN 1.6* 1.7*    CBC: Recent Labs  Lab 08/26/21 0645  WBC 5.4  NEUTROABS 3.2  HGB 7.9*  HCT 26.5*  MCV 91.1  PLT 140*    CBG: Recent Labs  Lab 08/30/21 0003 08/30/21 0340 08/30/21 2035 08/31/21 0024 08/31/21 0521  GLUCAP 108* 104* 178* 186* 142*      Scheduled Meds:  amiodarone  200 mg Oral Daily   Chlorhexidine Gluconate Cloth  6 each Topical Daily   citalopram  20 mg Oral Daily   enoxaparin (LOVENOX) injection  0.5 mg/kg Subcutaneous Q24H   feeding supplement  1 Container Oral TID BM   ibuprofen  400 mg Oral QID   levothyroxine  150 mcg Oral Q0600   loratadine  10  mg Oral Daily   mouth rinse  15 mL Mouth Rinse BID   metoprolol tartrate  12.5 mg Oral BID   pantoprazole  40 mg Oral BID   sodium chloride flush  10-40 mL Intracatheter Q12H   sodium chloride flush  10-40 mL Intracatheter Q12H   Continuous Infusions:  TPN CYCLIC-ADULT (ION) 96 mL/hr at 08/31/21 2595    Principal Problem:   Septic shock ([place]) Active Problems:   Hypokalemia   Morbid obesity with BMI of 50.0-59.9, adult (HCC)   Colon carcinoma ([place])   Enterocutaneous fistula   Cellulitis   AKI (acute kidney injury) ([place])   Hyponatremia   Abdominal pain   Consultants: General surgery  Procedures: Double lumen PICC placed on 9/26 to RUE  Antibiotics: Anidulafungin x1 dose on 8/24 Cefepime x1 dose on 8/24 Rocephin x1 dose on 8/24 Meropenem x1 dose on  8/24 Flagyl x1 dose on 8/24 Zosyn 8/24 through 9/6 Remdesivir x3 doses 8/25 through 8/27   Time spent: 25 minutes    Erin Hearing ANP  [facility] 7 am - 330 pm/M-F for direct patient care and secure chat Please refer to Amion for contact info 44  days

## 2021-08-31 NOTE — Progress Notes (Signed)
Occupational Therapy Treatment Patient Details Name: Lauren [name] MRN: [id number] DOB: 03/03/1946 Today's Date: 08/31/2021   History of present illness Pt is a 75 y.o. female admitted from [facility] on 5/99/35 with hypotension and Covid (+). Pt with colocutaneous fistula, Afib and new CHf since admission. Pt also with abdominal wall cellulitis and left thigh hematoma requiring blood transfusion. Of note, recent admission (8/9-8/23) to [facility] with partially obstructing tumor in the ascending colon s/p R hemicolectomy 07/06/21. PMH includes A fib, morbid obesity, HTN, lymphedema, hypothyroidism.   OT comments  Pt requesting to get on bedpan upon OT arrival. Pt required maxA to roll R<>L to use bed pan. She required totalA for hygiene, maxA for LB dressing. Pt required maxA+2 for bed mobility. She tolerated sitting EOB ~7min, but due to unresolved dizziness, further mobility was deferred and pt returned to supine. SpO2 99% RA and HR 97% while sitting EOB, unable to get BP. Pt reports she was "disappointed in myself" for limited session. Pt was motivated to participate in therapy this session. Per RN, she and pt had a very good conversation this morning regarding mobility progression. Pt will continue to benefit from skilled OT services to maximize safety and independence with ADL/IADL and functional mobility. Will continue to follow acutely and progress as tolerated.     Recommendations for follow up therapy are one component of a multi-disciplinary discharge planning process, led by the attending physician.  Recommendations may be updated based on patient status, additional functional criteria and insurance authorization.    Follow Up Recommendations  SNF    Equipment Recommendations  Wheelchair (measurements OT);Wheelchair cushion (measurements OT)    Recommendations for Other Services      Precautions / Restrictions Precautions Precautions: Fall Precaution Comments: BLE lymphedema, Eakin's  pouch for colocutaneous fistula, incontinence Restrictions Weight Bearing Restrictions: No       Mobility Bed Mobility Overal bed mobility: Needs Assistance Bed Mobility: Rolling;Sidelying to Sit;Sit to Sidelying Rolling: Max assist Sidelying to sit: Max assist;+2 for physical assistance;+2 for safety/equipment;HOB elevated     Sit to sidelying: Max assist;+2 for physical assistance;+2 for safety/equipment General bed mobility comments: maxA to roll R<>L pt assisting with UB, requiring majority of assistance for hips and BLE;Pt required assistance to progress BLE to EOB and trunk to upright posture. with return to supine, pt able to lower trunk and required assistance for BLE    Transfers                      Balance Overall balance assessment: Needs assistance Sitting-balance support: Feet supported;No upper extremity supported;Single extremity supported Sitting balance-Leahy Scale: Fair Sitting balance - Comments: sitting EOB with S once feet on the floor       Standing balance comment: unable to attempt this session due to reports of dizziness and change in pallor, unable to get BP reading                           ADL either performed or assessed with clinical judgement   ADL Overall ADL's : Needs assistance/impaired Eating/Feeding: Set up;Sitting   Grooming: Set up;Sitting Grooming Details (indicate cue type and reason): while seated EOB             Lower Body Dressing: Total assistance Lower Body Dressing Details (indicate cue type and reason): to don bilateral socks Toilet Transfer: Maximal assistance Toilet Transfer Details (indicate cue type and reason): rolling R<>L to  use bed pan Toileting- Clothing Manipulation and Hygiene: Total assistance Toileting - Clothing Manipulation Details (indicate cue type and reason): pt with some incontinence of bowels upon arrival, requesting bed pan. assisted pt on bed pan and pt had additional BM, very  loose stool;totalA for posterior and pericare       General ADL Comments: pt limited to sitting EOB, pt reporting increased dizziness and appeared to have change in palor, pt reporting she felt very "weak all over" and "if I stand up I feel I am going to go down". assisted pt with return to supine;educated pt on importance of sitting upright frequently throughout the day and importance of mobility     Vision       Perception     Praxis      Cognition Arousal/Alertness: Awake/alert Behavior During Therapy: WFL for tasks assessed/performed Overall Cognitive Status: No family/caregiver present to determine baseline cognitive functioning Area of Impairment: Following commands;Safety/judgement;Problem solving                   Current Attention Level: Selective   Following Commands: Follows one step commands with increased time;Follows one step commands consistently Safety/Judgement: Decreased awareness of safety Awareness: Intellectual;Emergent Problem Solving: Decreased initiation;Requires verbal cues;Difficulty sequencing General Comments: pt appears to have decreased short term memory, decreased problem solving, decreased awareness of deficits, self-limiting behaviors impacting independence with ADL and functional mobiltiy        Exercises     Shoulder Instructions       General Comments SpO2 99% on RA HR 97bpm while sitting EOB    Pertinent Vitals/ Pain       Pain Assessment: Faces Faces Pain Scale: Hurts little more Pain Location: LLE with mobility Pain Descriptors / Indicators: Discomfort;Grimacing;Guarding Pain Intervention(s): Monitored during session;Limited activity within patient's tolerance  Home Living                                          Prior Functioning/Environment              Frequency  Min 2X/week        Progress Toward Goals  OT Goals(current goals can now be found in the care plan section)  Progress  towards OT goals: Not progressing toward goals - comment (limited by dizziness and pallor)  Acute Rehab OT Goals Patient Stated Goal: to get better/walk OT Goal Formulation: With patient Time For Goal Achievement: 09/11/21 Potential to Achieve Goals: Fair ADL Goals Pt Will Perform Grooming: with min assist;standing Pt Will Transfer to Toilet: with mod assist;stand pivot transfer;bedside commode Additional ADL Goal #1: Pt will complete bed mobility with 1 person assistance to reduce caregiver burden. Additional ADL Goal #2: Pt will tolerate standing 3-1min for completion of ADL.  Plan Discharge plan remains appropriate    Co-evaluation                 AM-PAC OT "6 Clicks" Daily Activity     Outcome Measure   Help from another person eating meals?: A Little Help from another person taking care of personal grooming?: A Little Help from another person toileting, which includes using toliet, bedpan, or urinal?: A Lot Help from another person bathing (including washing, rinsing, drying)?: A Lot Help from another person to put on and taking off regular upper body clothing?: A Lot Help from another person to put on and taking  off regular lower body clothing?: Total 6 Click Score: 13    End of Session    OT Visit Diagnosis: Other abnormalities of gait and mobility (R26.89);Muscle weakness (generalized) (M62.81);Other symptoms and signs involving cognitive function;Pain;Adult, failure to thrive (R62.7) Pain - Right/Left: Left Pain - part of body: Hip   Activity Tolerance Other (comment) (pt limited due to reports of dizziness)   Patient Left in bed;with call bell/phone within reach;with bed alarm set (in chair position)   Nurse Communication Need for lift equipment;Mobility status        Time: 1351-1424 OT Time Calculation (min): 33 min  Charges: OT General Charges $OT Visit: 1 Visit OT Treatments $Self Care/Home Management : 23-37 mins  Helene Kelp OTR/L Acute  Rehabilitation Services Office: [place] 08/31/2021, 3:41 PM

## 2021-08-31 NOTE — Progress Notes (Signed)
Progress Note     Subjective: Patient was able to tolerate FLD yesterday and did not note increase in drainage from fistula. She lacks insight into current situation somewhat but understands that this is a chronic issue now and that she will need to follow up with Dr. Arnoldo Morale. She would like to try eating.   Objective: Vital signs in last 24 hours: Temp:  [97.7 F (36.5 C)-98 F (36.7 C)] 97.7 F (36.5 C) (10/07 0644) Pulse Rate:  [63-77] 77 (10/07 0644) Resp:  [18-20] 20 (10/07 0644) BP: (102-109)/(54-63) 109/63 (10/07 0644) SpO2:  [95 %-96 %] 95 % (10/07 0644) Weight:  [151.9 kg] 151.9 kg (10/07 0500) Last BM Date: 08/30/21  Intake/Output from previous day: 10/06 0701 - 10/07 0700 In: 2005.1 [P.O.:360; I.V.:1645.1] Out: 7893 [Urine:1225; Drains:30] Intake/Output this shift: No intake/output data recorded.  PE: General appearance: alert and cooperative Resp: normal effort Cardio: regular rate and rhythm GI: soft, minimal tenderness. No fluid in Eakin's this AM   Lab Results:  No results for input(s): WBC, HGB, HCT, PLT in the last 72 hours. BMET Recent Labs    08/30/21 0545  NA 131*  K 4.3  CL 102  CO2 24  GLUCOSE 116*  BUN 27*  CREATININE 0.57  CALCIUM 7.8*   PT/INR No results for input(s): LABPROT, INR in the last 72 hours. CMP     Component Value Date/Time   NA 131 (L) 08/30/2021 0545   K 4.3 08/30/2021 0545   CL 102 08/30/2021 0545   CO2 24 08/30/2021 0545   GLUCOSE 116 (H) 08/30/2021 0545   BUN 27 (H) 08/30/2021 0545   CREATININE 0.57 08/30/2021 0545   CALCIUM 7.8 (L) 08/30/2021 0545   PROT 5.6 (L) 08/30/2021 0545   ALBUMIN 1.7 (L) 08/30/2021 0545   AST 26 08/30/2021 0545   ALT 20 08/30/2021 0545   ALKPHOS 116 08/30/2021 0545   BILITOT 0.5 08/30/2021 0545   GFRNONAA >60 08/30/2021 0545   GFRAA >90 03/28/2014 0435   Lipase     Component Value Date/Time   LIPASE 25 03/27/2014 0600       Studies/Results: No results  found.  Anti-infectives: Anti-infectives (From admission, onward)    Start     Dose/Rate Route Frequency Ordered Stop   08/12/21 0415  clindamycin (CLEOCIN) IVPB 600 mg        600 mg 100 mL/hr over 30 Minutes Intravenous  Once 08/12/21 0317 08/12/21 0434   07/20/21 1000  remdesivir 100 mg in sodium chloride 0.9 % 100 mL IVPB       See Hyperspace for full Linked Orders Report.   100 mg 200 mL/hr over 30 Minutes Intravenous Daily 07/19/21 1040 07/21/21 1148   07/20/21 0200  vancomycin (VANCOREADY) IVPB 750 mg/150 mL  Status:  Discontinued        750 mg 150 mL/hr over 60 Minutes Intravenous Every 24 hours 07/19/21 1036 07/19/21 1449   07/19/21 2300  anidulafungin (ERAXIS) 100 mg in sodium chloride 0.9 % 100 mL IVPB  Status:  Discontinued        100 mg 78 mL/hr over 100 Minutes Intravenous Every 24 hours 07/18/21 2107 07/19/21 1449   07/19/21 1600  vancomycin (VANCOREADY) IVPB 750 mg/150 mL  Status:  Discontinued        750 mg 150 mL/hr over 60 Minutes Intravenous Every 24 hours 07/19/21 0919 07/19/21 1016   07/19/21 1600  vancomycin (VANCOREADY) IVPB 750 mg/150 mL  Status:  Discontinued  750 mg 150 mL/hr over 60 Minutes Intravenous Every 24 hours 07/19/21 1016 07/19/21 1036   07/19/21 1400  piperacillin-tazobactam (ZOSYN) IVPB 3.375 g  Status:  Discontinued        3.375 g 12.5 mL/hr over 240 Minutes Intravenous Every 8 hours 07/19/21 1016 07/31/21 1501   07/19/21 1130  remdesivir 200 mg in sodium chloride 0.9% 250 mL IVPB       See Hyperspace for full Linked Orders Report.   200 mg 580 mL/hr over 30 Minutes Intravenous Once 07/19/21 1040 07/19/21 1241   07/19/21 0100  metroNIDAZOLE (FLAGYL) IVPB 500 mg  Status:  Discontinued        500 mg 100 mL/hr over 60 Minutes Intravenous Every 12 hours 07/18/21 1203 07/18/21 1534   07/18/21 2200  ceFEPIme (MAXIPIME) 2 g in sodium chloride 0.9 % 100 mL IVPB  Status:  Discontinued        2 g 200 mL/hr over 30 Minutes Intravenous Every 12  hours 07/18/21 1156 07/18/21 1203   07/18/21 2200  meropenem (MERREM) 2 g in sodium chloride 0.9 % 100 mL IVPB  Status:  Discontinued        2 g 200 mL/hr over 30 Minutes Intravenous Every 12 hours 07/18/21 2104 07/19/21 1011   07/18/21 2200  anidulafungin (ERAXIS) 200 mg in sodium chloride 0.9 % 200 mL IVPB        200 mg 78 mL/hr over 200 Minutes Intravenous  Once 07/18/21 2108 07/19/21 0419   07/18/21 1800  clindamycin (CLEOCIN) IVPB 900 mg  Status:  Discontinued        900 mg 100 mL/hr over 30 Minutes Intravenous Every 8 hours 07/18/21 1534 07/18/21 2103   07/18/21 1600  vancomycin (VANCOREADY) IVPB 2000 mg/400 mL  Status:  Discontinued        2,000 mg 200 mL/hr over 120 Minutes Intravenous Every 24 hours 07/18/21 1544 07/19/21 0919   07/18/21 1545  piperacillin-tazobactam (ZOSYN) IVPB 3.375 g  Status:  Discontinued        3.375 g 12.5 mL/hr over 240 Minutes Intravenous Every 8 hours 07/18/21 1544 07/18/21 2104   07/18/21 1300  cefTRIAXone (ROCEPHIN) 2 g in sodium chloride 0.9 % 100 mL IVPB  Status:  Discontinued        2 g 200 mL/hr over 30 Minutes Intravenous Every 24 hours 07/18/21 1203 07/18/21 1628   07/18/21 1200  ceFEPIme (MAXIPIME) 2 g in sodium chloride 0.9 % 100 mL IVPB  Status:  Discontinued        2 g 200 mL/hr over 30 Minutes Intravenous Every 12 hours 07/18/21 1155 07/18/21 1156   07/18/21 0930  ceFEPIme (MAXIPIME) 2 g in sodium chloride 0.9 % 100 mL IVPB        2 g 200 mL/hr over 30 Minutes Intravenous  Once 07/18/21 0918 07/18/21 1135   07/18/21 0930  metroNIDAZOLE (FLAGYL) IVPB 500 mg  Status:  Discontinued        500 mg 100 mL/hr over 60 Minutes Intravenous  Once 07/18/21 8416 07/18/21 1534        Assessment/Plan 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 Arnoldo Morale at [place] on 8/12 - path w/ adenocarcinoma. Oncology has seen on 9/5 - Midline wound opened by Dr. Arnoldo Morale at bedside 8/24 which had stool decompressing through CC  fistula  - Currently has eakin's pouch - 30 cc out recorded for overnight - CT 9/17 w/ overall improvement and no sign of  persistent communication between bowel lumen and wound  - Although imaging reassuring, given air and output still in eakin's pouch I am concerned the CC fistula has not completely closed. Made NPO again on 9/20.  - Appreciate palliative assistance with [facility] [place], last seen 9/28 - patient started on liquids 10/3 and output has remained low, advanced to FLD 10/6 and output remained low, will advance to soft diet today and can follow output - patient is stable for discharge to SNF on TPN from a surgical standpoint and can follow up with Dr. Arnoldo Morale. If patient able to take in enough calories, may even be able to discontinue TPN. Defer outpatient management of CC fistula to Dr. Arnoldo Morale   FEN - soft diet, TPN VTE - SCDs, Lovenox, per primary  ID - None currently.    COVID + A. Fib on Xarelto at home  ABL anemia - FOBT positive 8/24 Hypothyroidism Severe protein calorie malnutrition - albumin 1.3. Pre-alb <5. On TNA  LOS: 12 days    Norm Parcel, [facility] 08/31/2021, 8:55 AM Please see Amion for pager number during day hours 7:00am-4:30pm

## 2021-08-31 NOTE — Progress Notes (Signed)
Nutrition Follow-up  DOCUMENTATION CODES:  Not applicable  INTERVENTION:  -48-72 hour calorie count, RD will follow-up with results on Monday, 10/10 -Continue TPN management per Pharmacy -d/c Boost Breeze -Ensure Enlive po TID, each supplement provides 350 kcal and 20 grams of protein -Magic cup TID with meals, each supplement provides 290 kcal and 9 grams of protein  NUTRITION DIAGNOSIS:  Inadequate oral intake related to altered GI function, acute illness as evidenced by NPO status. -- ongoing  GOAL:  Patient will meet greater than or equal to 90% of their needs -- addressing with TPN and supplements  MONITOR:  Labs, Weight trends, Skin, I & O's, Other (Comment) (TPN)  REASON FOR ASSESSMENT:  Consult Assessment of nutrition requirement/status, New TPN/TNA  ASSESSMENT:  75 year old female who presented to the ED on 8/24 s/p CPR. Pt with recent admission from 8/09 to 8/23 with anemia from GI bleed secondary to cecal mass s/p R hemicolectomy with primary anastomosis on 07/06/21. 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 Pt also found to be positive for COVID-19 on admission. PMH of atrial fibrillation, CHF, HTN, lymphedema, mitral regurgitation. Pt admitted with septic shock with abdominal wall fluid collection and concern for necrotizing fasciitis.  9/11- diet advanced to clear liquids 9/13 - diet advanced to full liquids 9/15 - Eakin's pouch changed 9/21 - pt made NPO due to concern that fistula not completely closed 10/03 - diet advanced to clear liquids 10/06 - diet advanced to full liquids   Discussed pt with NP. Pt c/o nausea but denies vomiting. Abdomen is soft with small amount of drainage from midline fistula. Pt w/ scheduled zofran to manage sx as diet is advanced. Pt has been on clear/full liquid diet without issues in tolerance or increase in drainage from fistula. Surgery advancing diet to soft today. If pt able to maintain adequate calorie intake  with diet advancement, plan to discontinue TPN. Calorie count being initiated today as no PO documentation available in Epic since 10/01. Discussed with RN.   Per Pharmacy, no change to TPN today. TPN to cycle over 14 hours @ goal volume of 25108ml/24 hours (provides 2362 kcals and 150 grams protein; meets 100% of estimated needs).   UOP: 1278mL x24 hours Fistula output: 49ml x24 hours I/O: +14.4L since admit  Per RN edema assessment, pt w/ deep pitting edema to BLE.  Medications: Boost Breeze TID, Protonix Labs: Na 131 (L) CBGs: 142-132-147-102  Diet Order:   Diet Order             DIET SOFT Room service appropriate? Yes; Fluid consistency: Thin  Diet effective now                  EDUCATION NEEDS:  Not appropriate for education at this time  Skin:  Skin Assessment: Skin Integrity Issues: Skin Integrity Issues:: Other (Comment) Other: pressure injury to coccyx, pressure injury to R thigh, Eakins pouch to abdomen, MASD to bilateral buttocks  Last BM:  10/06 type 7  Height:  Ht Readings from Last 1 Encounters:  08/30/21 5\' 2"  (1.575 m)   Weight:  Wt Readings from Last 1 Encounters:  08/31/21 (!) 151.9 kg   Ideal Body Weight:  50 kg  BMI:  Body mass index is 61.25 kg/m.  Estimated Nutritional Needs:  Kcal:  6734-1937 Protein:  140-160 grams Fluid:  >/= 2.0 L    Larkin Ina, MS, RD, LDN (she/her/hers) RD pager number and weekend/on-call pager number located in [place].

## 2021-08-31 NOTE — Progress Notes (Signed)
Pt c/o headache 8/10, Tylenol given at [place]. Per pt, Tylenol did not help, but RN checked on pt twice since med was given and pt asleep.  MD notified.

## 2021-09-01 DIAGNOSIS — K632 Fistula of intestine: Secondary | ICD-10-CM | POA: Diagnosis not present

## 2021-09-01 LAB — CBC WITH DIFFERENTIAL/PLATELET
Abs Immature Granulocytes: 0.02 10*3/uL (ref 0.00–0.07)
Basophils Absolute: 0 10*3/uL (ref 0.0–0.1)
Basophils Relative: 0 %
Eosinophils Absolute: 0.1 10*3/uL (ref 0.0–0.5)
Eosinophils Relative: 1 %
HCT: 27.6 % — ABNORMAL LOW (ref 36.0–46.0)
Hemoglobin: 8.1 g/dL — ABNORMAL LOW (ref 12.0–15.0)
Immature Granulocytes: 0 %
Lymphocytes Relative: 14 %
Lymphs Abs: 0.9 10*3/uL (ref 0.7–4.0)
MCH: 26.8 pg (ref 26.0–34.0)
MCHC: 29.3 g/dL — ABNORMAL LOW (ref 30.0–36.0)
MCV: 91.4 fL (ref 80.0–100.0)
Monocytes Absolute: 0.9 10*3/uL (ref 0.1–1.0)
Monocytes Relative: 14 %
Neutro Abs: 4.4 10*3/uL (ref 1.7–7.7)
Neutrophils Relative %: 71 %
Platelets: 142 10*3/uL — ABNORMAL LOW (ref 150–400)
RBC: 3.02 MIL/uL — ABNORMAL LOW (ref 3.87–5.11)
RDW: 18.3 % — ABNORMAL HIGH (ref 11.5–15.5)
WBC: 6.3 10*3/uL (ref 4.0–10.5)
nRBC: 0 % (ref 0.0–0.2)

## 2021-09-01 LAB — GLUCOSE, CAPILLARY
Glucose-Capillary: 118 mg/dL — ABNORMAL HIGH (ref 70–99)
Glucose-Capillary: 93 mg/dL (ref 70–99)
Glucose-Capillary: 88 mg/dL (ref 70–99)
Glucose-Capillary: 78 mg/dL (ref 70–99)
Glucose-Capillary: 91 mg/dL (ref 70–99)

## 2021-09-01 LAB — BASIC METABOLIC PANEL
Anion gap: 6 (ref 5–15)
BUN: 31 mg/dL — ABNORMAL HIGH (ref 8–23)
CO2: 20 mmol/L — ABNORMAL LOW (ref 22–32)
Calcium: 7.8 mg/dL — ABNORMAL LOW (ref 8.9–10.3)
Chloride: 105 mmol/L (ref 98–111)
Creatinine, Ser: 0.55 mg/dL (ref 0.44–1.00)
GFR, Estimated: >60 mL/min (ref >60)
Glucose, Bld: 109 mg/dL — ABNORMAL HIGH (ref 70–99)
Potassium: 5 mmol/L (ref 3.5–5.1)
Sodium: 131 mmol/L — ABNORMAL LOW (ref 135–145)

## 2021-09-01 LAB — PHOSPHORUS: Phosphorus: 3 mg/dL (ref 2.5–4.6)

## 2021-09-01 MED ORDER — TRACE MINERALS CU-MN-SE-ZN 300-55-60-3000 MCG/ML IV SOLN
INTRAVENOUS | Status: AC
  Filled 2021-09-01: qty 1497.6

## 2021-09-01 MED ORDER — FUROSEMIDE 40 MG PO TABS
40.0000 mg | ORAL_TABLET | Freq: Every day | ORAL | Status: DC
  Administered 2021-09-01 – 2021-09-02 (×2): 40 mg via ORAL
  Filled 2021-09-01 (×2): qty 1

## 2021-09-01 MED ORDER — MIRTAZAPINE 15 MG PO TABS
7.5000 mg | ORAL_TABLET | Freq: Every day | ORAL | Status: DC
  Administered 2021-09-01 – 2021-09-06 (×6): 7.5 mg via ORAL
  Filled 2021-09-01 (×6): qty 1

## 2021-09-01 NOTE — Progress Notes (Signed)
PHARMACY - TOTAL PARENTERAL NUTRITION CONSULT NOTE  Indication: Fistula  Patient Measurements: Height: 5\' 2"  (157.5 cm) Weight: (!) 151.9 kg (334 lb 14.1 oz) IBW/kg (Calculated) : 50.1 TPN AdjBW (KG): 75 Body mass index is 61.25 kg/m.  Assessment:  80 YOF with recent R-hemicolectomy on 8/12 in the setting of a cecal mass - consistent with R-colon metastatic adenocarcinoma. 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 Pharmacy consulted to manage TPN for nutritional support in the setting of a colocutaneous fistula.    Glucose / Insulin: CBGs < 180 (one incident of hypoglycemia 10/5 while off TPN, CBGs unusually higher on 10/6). SSI d/c'ed 08/22/21. Electrolytes: Na 131, K high normal at 5, low CO2, others WNL Renal: SCr stable <1, BUN 31 Hepatic: LFTs / tbili / TG WNL, albumin 1.7 Intake / Output; MIVF: UOP not charted, LBM 10/6, fistula 69mL GI Imaging:  8/24 CT: colocutaneous fistula, abd wall abscess, soft tissue edema, cellulitis vs nec fasciitis 8/25 Abd xray: diffuse abd wall emphysema, nonobstructive bowel gas pattern 9/4 CT: medial upper left thigh hematoma 9/17 CT: no evidence of communication between bowel and subQ tissue. Decrease volume extensive SQ gas along the RIGHT abd wall and upper groin. GI Surgeries / Procedures:  8/12 at [facility]: R-hemicolectomy 8/24: Midline wound opened by Dr. Arnoldo Morale at bedside  Central access: PICC replaced 07/27/21, exchanged 9/26. TPN start date: 07/19/21  Nutritional Goals (RD assessment on 10/7): 2300-2500 kcal, 140-160g AA, fluid >/= 2L per day  Current Nutrition:  TPN Soft diet started 10/7 Ensure Enlive TID - 1 charted given on 10/7  Plan:  Continue cyclic TPN, infuse 6286 mls over 14 hrs to provide 150g AA, 312g CHO and 70g ILE for a total of 2358 kCal, meeting 100% of estimated needs Electrolytes in TPN: increase Na to 150 mEq/L, reduce K to 24mEq/L, reduce Ca to 26mEq/L due to  high Mag needs, Mg 55mEq/L, Phos 70mmol/L, change Cl:Ac 1:1 Add standard MVI and trace elements to TPN Reduce CBG checks to Q6H ([id number]) Standard TPN labs on Mon and Thurs.  BMET in AM to evaluate K. F/u calorie count, to be finalized on 10/10 F/u dispo plans:  medically ready for discharge  Yahmir Sokolov D. Mina Marble, PharmD, BCPS, [place] 09/01/2021, 10:32 AM

## 2021-09-01 NOTE — Plan of Care (Signed)
  Problem: Fluid Volume: Goal: Hemodynamic stability will improve Outcome: Progressing   Problem: Clinical Measurements: Goal: Diagnostic test results will improve Outcome: Progressing   

## 2021-09-01 NOTE — Progress Notes (Signed)
[facility] PROGRESS NOTE  Lauren [name]:[id number] DOB: 1946-10-01 DOA: 07/18/2021 PCP: Pcp, No  Status: Remains inpatient appropriate because:Unsafe d/c plan and IV treatments appropriate due to intensity of illness or inability to take PO  Dispo: The patient is from: Home              Anticipated d/c is to: [facility] healthcare center-insurance authorization pending              Patient currently is medically stable to d/c.   Difficult to place patient Yes                Barriers to discharge:  Level of care: Med-Surg   Code Status: DNR Family Communication: No family at bedside DVT prophylaxis: Lovenox COVID vaccination status: Unknown    HPI: 75 year old female patient who recently underwent right hemicolectomy on 07/06/2021 and was found to have metastatic adenoma of the right colon at home she was having abdominal pain, nausea and right-sided back pain.  She presented to [facility] with sepsis physiology that was unresponsive to fluid challenge and was placed on vasopressors.  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  She was incidentally found to be COVID-positive at time of admission.  Due to the abnormal CT findings she was treated with broad-spectrum antibiotics and general surgery was consulted. The patient was transferred to [facility].  She eventually stabilized enough to move out of the ICU and was transferred to hospitalist team on 8/27.  Follow-up CT on 9/17 with overall improvement and no signs of persistent communication of the bowel lumen and the wound in the abdomen.  She remains n.p.o. and on parenteral nutrition.  As documented fistula can take 8 to 9 weeks to close once fistula can become chronic.  Subjective: She does report some mild nausea, reports some anxiety at nighttime requesting something to help with anxiety and to help her sleep.  Objective: Vitals:   08/31/21  2038 09/01/21 0604  BP: 127/67 (!) 108/57  Pulse: 98 (!) 116  Resp: 19   Temp: 97.9 F (36.6 C) 97.7 F (36.5 C)  SpO2: 97% 96%    Intake/Output Summary (Last 24 hours) at 09/01/2021 1130 Last data filed at 09/01/2021 0844 Gross per 24 hour  Intake 2135.77 ml  Output 500 ml  Net 1635.77 ml   Filed Weights   08/30/21 0337 08/31/21 0500 09/01/21 0246  Weight: (!) 149.7 kg (!) 151.9 kg (!) 151.9 kg    Exam:   Awake Alert, Oriented X 3, No new F.N deficits, Normal affect Symmetrical Chest wall movement, Good air movement bilaterally, CTAB RRR,No Gallops,Rubs or new Murmurs, No Parasternal Heave +ve B.Sounds, Abd Soft, No tenderness, No rebound - guarding or rigidity.  He has Eakins pouch over her fistula and abdomen. No Cyanosis, she has significant lower extremity lymphedema   Assessment/Plan: Acute problems: Post operative colocutaneous fistula with cellulitis and abscess, complicated with sepsis (present on admission). New diagnosis of metastatic colon adenocarcinoma. Completed IV Zosyn on 9/6 Although recent CT on 9/17 encouraging since there remains air in output from the Eakin's pouch surgery is concerned that the fistula has not completely closed Surgical team states will advance to soft diet today.  If able to maintain adequate caloric intake plan is to discontinue TPN.  Calorie count initiated Continue boost breeze supplementation. -She did report some nausea yesterday, but this has improved, I have encouraged her to increase  her Ensure intake, no further nausea.   Left thigh hematoma, with acute blood loss anemia.  Resolved sp 4 units PRBCs Hemoglobin is stable between 7.9 and 8.8   Atrial fibrillation with RVR.  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  LV systolic function 09%, w/diastolic dysfunction 5 years ago  echocardiogram was normal with EF of 55 to 60% and no evidence of diastolic dysfunction or other abnormality Given current debility lymphedema remains stable   AKI, hyponatremia, hypokalemia, hypomagnesemia, hypochloremia.  Resolved   Obesity class 3/severe protein calorie malnutrition/hypoalbuminemia Body mass index is 61.25 kg/m.  Has hypoalbuminemia with serum albumin less than 1.6 Nutrition Problem: Inadequate oral intake Etiology: altered GI function, acute illness Signs/Symptoms: NPO status Interventions: TPN   Coccyx and thigh pressure ulcer  Nursing has documented resolved as of 9/25  Physical debility PT documents making slow progress towards goals and needs significant encouragement. Morbid obesity also contributing to difficulty with ambulating and repositioning self in bed Current focus is on increasing mobility in the bed well is working on transfers to EOB and attempting to stand. Recommendation is for SNF   Metabolic encephalopathy with delirium Resolved  Anxiety and depression,  Continue with citalopram and PRN lorazepam.   Hypothyroidism Continue levothyroxine    COVID 19 infection, Was completely asymptomatic without any pneumonia or other significant abnormality Was positive on 8/24 and has completed quarantine   -Patient reports some anxiety, insomnia overnight, so we will start her on mirtazapine to help with her in terms, and to help with her appetite as well, I will check EKG to monitor her QTC. -She will be started on some Lasix for her lymphedema.  Data Reviewed: Basic Metabolic Panel: Recent Labs  Lab 08/26/21 0358 08/27/21 0601 08/30/21 0545 09/01/21 0224  NA 140 135 131* 131*  K 3.7 4.0 4.3 5.0  CL 102 101 102 105  CO2 32 30 24 20*  GLUCOSE 70 85 116* 109*  BUN 35* 31* 27* 31*  CREATININE 0.60 0.52 0.57 0.55  CALCIUM 7.9* 7.9* 7.8* 7.8*  MG 2.0 2.2 2.1  --   PHOS 4.7* 3.3 2.7 3.0   Liver Function Tests: Recent Labs  Lab  08/27/21 0601 08/30/21 0545  AST 25 26  ALT 20 20  ALKPHOS 98 116  BILITOT 0.4 0.5  PROT 5.5* 5.6*  ALBUMIN 1.6* 1.7*    CBC: Recent Labs  Lab 08/26/21 0645 09/01/21 0224  WBC 5.4 6.3  NEUTROABS 3.2 4.4  HGB 7.9* 8.1*  HCT 26.5* 27.6*  MCV 91.1 91.4  PLT 140* 142*    CBG: Recent Labs  Lab 08/31/21 1630 08/31/21 2035 08/31/21 2336 09/01/21 0606 09/01/21 0942  GLUCAP 102* 124* 108* 118* 93      Scheduled Meds:  amiodarone  200 mg Oral Daily   Chlorhexidine Gluconate Cloth  6 each Topical Daily   citalopram  20 mg Oral Daily   enoxaparin (LOVENOX) injection  0.5 mg/kg Subcutaneous Q24H   feeding supplement  237 mL Oral TID BM   levothyroxine  150 mcg Oral Q0600   loratadine  10 mg Oral Daily   mouth rinse  15 mL Mouth Rinse BID   metoprolol tartrate  12.5 mg Oral BID   pantoprazole  40 mg Oral BID   sodium chloride flush  10-40 mL Intracatheter Q12H   sodium chloride flush  10-40 mL Intracatheter Q12H  Continuous Infusions:  TPN CYCLIC-ADULT (ION)      Principal Problem:   Septic shock (HCC) Active Problems:   Hypokalemia   Morbid obesity with BMI of 50.0-59.9, adult (HCC)   Colon carcinoma ([place])   Enterocutaneous fistula   Cellulitis   AKI (acute kidney injury) ([place])   Hyponatremia   Abdominal pain   Consultants: General surgery  Procedures: Double lumen PICC placed on 9/26 to RUE  Antibiotics: Anidulafungin x1 dose on 8/24 Cefepime x1 dose on 8/24 Rocephin x1 dose on 8/24 Meropenem x1 dose on 8/24 Flagyl x1 dose on 8/24 Zosyn 8/24 through 9/6 Remdesivir x3 doses 8/25 through 8/27     [facility] Emeline Gins Jun Osment MD 45  days

## 2021-09-02 DIAGNOSIS — I890 Lymphedema, not elsewhere classified: Secondary | ICD-10-CM

## 2021-09-02 DIAGNOSIS — K632 Fistula of intestine: Secondary | ICD-10-CM | POA: Diagnosis not present

## 2021-09-02 DIAGNOSIS — A419 Sepsis, unspecified organism: Secondary | ICD-10-CM | POA: Diagnosis not present

## 2021-09-02 DIAGNOSIS — R6521 Severe sepsis with septic shock: Secondary | ICD-10-CM | POA: Diagnosis not present

## 2021-09-02 LAB — BASIC METABOLIC PANEL
Anion gap: 5 (ref 5–15)
BUN: 33 mg/dL — ABNORMAL HIGH (ref 8–23)
CO2: 23 mmol/L (ref 22–32)
Calcium: 7.5 mg/dL — ABNORMAL LOW (ref 8.9–10.3)
Chloride: 102 mmol/L (ref 98–111)
Creatinine, Ser: 0.55 mg/dL (ref 0.44–1.00)
GFR, Estimated: >60 mL/min (ref >60)
Glucose, Bld: 98 mg/dL (ref 70–99)
Potassium: 3.8 mmol/L (ref 3.5–5.1)
Sodium: 130 mmol/L — ABNORMAL LOW (ref 135–145)

## 2021-09-02 LAB — GLUCOSE, CAPILLARY: Glucose-Capillary: 80 mg/dL (ref 70–99)

## 2021-09-02 MED ORDER — POTASSIUM CHLORIDE CRYS ER 20 MEQ PO TBCR
40.0000 meq | EXTENDED_RELEASE_TABLET | Freq: Once | ORAL | Status: AC
  Administered 2021-09-02: 40 meq via ORAL
  Filled 2021-09-02: qty 2

## 2021-09-02 MED ORDER — FUROSEMIDE 10 MG/ML IJ SOLN
40.0000 mg | Freq: Every day | INTRAMUSCULAR | Status: AC
  Administered 2021-09-03 – 2021-09-05 (×3): 40 mg via INTRAVENOUS
  Filled 2021-09-02 (×3): qty 4

## 2021-09-02 MED ORDER — FUROSEMIDE 10 MG/ML IJ SOLN
40.0000 mg | Freq: Once | INTRAMUSCULAR | Status: AC
  Administered 2021-09-02: 40 mg via INTRAVENOUS
  Filled 2021-09-02: qty 4

## 2021-09-02 MED ORDER — TRACE MINERALS CU-MN-SE-ZN 300-55-60-3000 MCG/ML IV SOLN
INTRAVENOUS | Status: AC
  Filled 2021-09-02: qty 1497.6

## 2021-09-02 NOTE — Progress Notes (Signed)
[facility] PROGRESS NOTE  KEEARA FREES FGH:[id number] DOB: 12/15/1945 DOA: 07/18/2021 PCP: Pcp, No  Status: Remains inpatient appropriate because:Unsafe d/c plan and IV treatments appropriate due to intensity of illness or inability to take PO  Dispo: The patient is from: Home              Anticipated d/c is to: [facility] healthcare center-insurance authorization pending              Patient currently is medically stable to d/c.   Difficult to place patient Yes                Barriers to discharge:  Level of care: Med-Surg   Code Status: DNR Family Communication: No family at bedside DVT prophylaxis: Lovenox COVID vaccination status: Unknown    HPI: 75 year old female patient who recently underwent right hemicolectomy on 07/06/2021 and was found to have metastatic adenoma of the right colon at home she was having abdominal pain, nausea and right-sided back pain.  She presented to [facility] with sepsis physiology that was unresponsive to fluid challenge and was placed on vasopressors.  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  She was incidentally found to be COVID-positive at time of admission.  Due to the abnormal CT findings she was treated with broad-spectrum antibiotics and general surgery was consulted. The patient was transferred to [facility].  She eventually stabilized enough to move out of the ICU and was transferred to hospitalist team on 8/27.  Follow-up CT on 9/17 with overall improvement and no signs of persistent communication of the bowel lumen and the wound in the abdomen.  She remains n.p.o. and on parenteral nutrition.  As documented fistula can take 8 to 9 weeks to close once fistula can become chronic.  Subjective: Reports her anxiety and insomnia significantly improved with Remeron, as well appetite has improved.,  No nausea.   Objective: Vitals:   09/01/21 2056 09/02/21 1008   BP: 115/69 117/71  Pulse: 91 99  Resp: 19 20  Temp: 97.8 F (36.6 C) 98 F (36.7 C)  SpO2: 97% 99%    Intake/Output Summary (Last 24 hours) at 09/02/2021 1456 Last data filed at 09/02/2021 1154 Gross per 24 hour  Intake 3455.77 ml  Output 2500 ml  Net 955.77 ml   Filed Weights   08/31/21 0500 09/01/21 0246 09/02/21 0600  Weight: (!) 151.9 kg (!) 151.9 kg (!) 154.3 kg    Exam:  Awake Alert, Oriented X 3, No new F.N deficits, Normal affect Symmetrical Chest wall movement, diminished air entry at the bases RRR,No Gallops,Rubs or new Murmurs, No Parasternal Heave +ve B.Sounds, Abd Soft, No tenderness, she has Eakin's pouch over her fistula in the abdomen No Cyanosis, Clubbing , she has extreme left lower extremity lymphedema.  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 New diagnosis of metastatic colon adenocarcinoma. Completed IV Zosyn on 9/6 Although recent CT on 9/17 encouraging since there remains air in output from the Eakin's pouch surgery is concerned that the fistula has not completely closed Surgical team states will advance to soft diet today.  If able to maintain adequate caloric intake plan is to discontinue TPN.  Calorie count initiated -Overall  appetite, oral intake has significantly improved, she was encouraged to increase her Ensure intake, hopefully TPN can be discontinued in 24 to 48 hours    Left  thigh hematoma, with acute blood loss anemia.  Resolved sp 4 units PRBCs Hemoglobin is stable between 7.9 and 8.8   Atrial fibrillation with RVR.  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  LV systolic function 70%, w/diastolic dysfunction 5 years ago echocardiogram was normal with EF of 55 to 60% and no evidence  of diastolic dysfunction or other abnormality Given current debility lymphedema remains stable   AKI, hyponatremia, hypokalemia, hypomagnesemia, hypochloremia.  Resolved   Obesity class 3/severe protein calorie malnutrition/hypoalbuminemia Body mass index is 62.22 kg/m.  Has hypoalbuminemia with serum albumin less than 1.6 Nutrition Problem: Inadequate oral intake Etiology: altered GI function, acute illness Signs/Symptoms: NPO status Interventions: TPN   Coccyx and thigh pressure ulcer  Nursing has documented resolved as of 9/25  Physical debility PT documents making slow progress towards goals and needs significant encouragement. Morbid obesity also contributing to difficulty with ambulating and repositioning self in bed Current focus is on increasing mobility in the bed well is working on transfers to EOB and attempting to stand. Recommendation is for SNF   Metabolic encephalopathy with delirium Resolved  Anxiety and depression,  Continue with citalopram and PRN lorazepam.   Hypothyroidism Continue levothyroxine    COVID 19 infection, Was completely asymptomatic without any pneumonia or other significant abnormality Was positive on 8/24 and has completed quarantine  Chronic lymphedema -No significant volume overload, and worsening lymphedema, will change her p.o. Lasix to IV Lasix, and will request Unna boots as well   -Started on Remeron, tight and insomnia appears to have improved, .   Data Reviewed: Basic Metabolic Panel: Recent Labs  Lab 08/27/21 0601 08/30/21 0545 09/01/21 0224 09/02/21 0437  NA 135 131* 131* 130*  K 4.0 4.3 5.0 3.8  CL 101 102 105 102  CO2 30 24 20* 23  GLUCOSE 85 116* 109* 98  BUN 31* 27* 31* 33*  CREATININE 0.52 0.57 0.55 0.55  CALCIUM 7.9* 7.8* 7.8* 7.5*  MG 2.2 2.1  --   --   PHOS 3.3 2.7 3.0  --    Liver Function Tests: Recent Labs  Lab 08/27/21 0601 08/30/21 0545  AST 25 26  ALT 20 20  ALKPHOS 98 116  BILITOT 0.4  0.5  PROT 5.5* 5.6*  ALBUMIN 1.6* 1.7*    CBC: Recent Labs  Lab 09/01/21 0224  WBC 6.3  NEUTROABS 4.4  HGB 8.1*  HCT 27.6*  MCV 91.4  PLT 142*    CBG: Recent Labs  Lab 09/01/21 0942 09/01/21 1257 09/01/21 1609 09/01/21 2100 09/02/21 0842  GLUCAP 93 88 78 91 80      Scheduled Meds:  amiodarone  200 mg Oral Daily   Chlorhexidine Gluconate Cloth  6 each Topical Daily   citalopram  20 mg Oral Daily   enoxaparin (LOVENOX) injection  0.5 mg/kg Subcutaneous Q24H   feeding supplement  237 mL Oral TID BM   furosemide  40 mg Intravenous Once   [START ON 09/03/2021] furosemide  40 mg Intravenous Daily   levothyroxine  150 mcg Oral Q0600   loratadine  10 mg Oral Daily   mouth rinse  15 mL Mouth Rinse BID   metoprolol tartrate  12.5 mg Oral BID   mirtazapine  7.5 mg Oral QHS   pantoprazole  40 mg Oral BID   sodium chloride flush  10-40 mL Intracatheter Q12H   sodium chloride flush  10-40 mL Intracatheter Q12H   Continuous Infusions:  TPN CYCLIC-ADULT (ION)      Principal Problem:   Septic shock (HCC) Active Problems:   Hypokalemia   Morbid obesity with BMI of 50.0-59.9, adult (HCC)   Colon carcinoma ([place])   Enterocutaneous fistula   Cellulitis   AKI (acute kidney injury) ([place])   Hyponatremia   Abdominal pain   Consultants: General surgery  Procedures: Double lumen PICC placed on 9/26 to RUE  Antibiotics: Anidulafungin x1 dose on 8/24 Cefepime x1 dose on 8/24 Rocephin x1 dose on 8/24 Meropenem x1 dose on 8/24 Flagyl x1 dose on 8/24 Zosyn 8/24 through 9/6 Remdesivir x3 doses 8/25 through 8/27     [facility] Emeline Gins Jeanise Durfey MD 46  days

## 2021-09-02 NOTE — Progress Notes (Signed)
PHARMACY - TOTAL PARENTERAL NUTRITION CONSULT NOTE  Indication: Fistula  Patient Measurements: Height: 5\' 2"  (157.5 cm) Weight: (!) 154.3 kg (340 lb 2.7 oz) IBW/kg (Calculated) : 50.1 TPN AdjBW (KG): 75 Body mass index is 62.22 kg/m.  Assessment:  64 YOF with recent R-hemicolectomy on 8/12 in the setting of a cecal mass - consistent with R-colon metastatic adenocarcinoma. 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 Pharmacy consulted to manage TPN for nutritional support in the setting of a colocutaneous fistula.    Glucose / Insulin: CBGs low normal; SSI d/c'ed 08/22/21 (one incident of hypoglycemia 10/5 while off TPN, CBGs unusually higher on 10/6).  Electrolytes: Na 130 (max in TPN), K 5 > 3.8 (reduction in TPN and Lasix 40mg  PO daily added 10/8), others WNL Renal: SCr stable <1, BUN 31 Hepatic: LFTs / tbili / TG WNL, albumin 1.7 Intake / Output; MIVF: UOP 0.6 ml/kg/hr, LBM 10/6, fistula 40mL GI Imaging:  8/24 CT: colocutaneous fistula, abd wall abscess, soft tissue edema, cellulitis vs nec fasciitis 8/25 Abd xray: diffuse abd wall emphysema, nonobstructive bowel gas pattern 9/4 CT: medial upper left thigh hematoma 9/17 CT: no evidence of communication between bowel and subQ tissue. Decrease volume extensive SQ gas along the RIGHT abd wall and upper groin. GI Surgeries / Procedures:  8/12 at [facility]: R-hemicolectomy 8/24: Midline wound opened by Dr. Arnoldo Morale at bedside  Central access: PICC replaced 07/27/21, exchanged 9/26. TPN start date: 07/19/21  Nutritional Goals (RD assessment on 10/7): 2300-2500 kcal, 140-160g AA, fluid >/= 2L per day  Current Nutrition:  TPN Soft diet started 10/7  Ensure Enlive TID - none charted given on 10/6  Plan:  Continue cyclic TPN, infuse 2263 mls over 14 hrs to provide 150g AA, 312g CHO and 70g ILE for a total of 2358 kCal, meeting 100% of estimated needs Electrolytes in TPN: Na 150 mEq/L,  increase K to 47mEq/L, reduce Ca to 34mEq/L due to high Mag needs on 10/8, Mg 55mEq/L, Phos 77mmol/L, Cl:Ac 1:1 Add standard MVI and trace elements to TPN Continue Q6H CBGs checks (04/02/14/20) - may need to increase CHO or reduce TPN cycle KCL 75mEq PO x 1 per MD Standard TPN labs on Mon and Thurs F/u calorie count, to be finalized on 10/10  F/u dispo plans:  medically ready for discharge  Ota Ebersole D. Mina Marble, PharmD, BCPS, [place] 09/02/2021, 8:52 AM

## 2021-09-03 DIAGNOSIS — R6521 Severe sepsis with septic shock: Secondary | ICD-10-CM | POA: Diagnosis not present

## 2021-09-03 DIAGNOSIS — A419 Sepsis, unspecified organism: Secondary | ICD-10-CM | POA: Diagnosis not present

## 2021-09-03 LAB — PHOSPHORUS: Phosphorus: 4 mg/dL (ref 2.5–4.6)

## 2021-09-03 LAB — COMPREHENSIVE METABOLIC PANEL
ALT: 18 U/L (ref 0–44)
AST: 21 U/L (ref 15–41)
Albumin: 1.7 g/dL — ABNORMAL LOW (ref 3.5–5.0)
Alkaline Phosphatase: 115 U/L (ref 38–126)
Anion gap: 4 — ABNORMAL LOW (ref 5–15)
BUN: 35 mg/dL — ABNORMAL HIGH (ref 8–23)
CO2: 29 mmol/L (ref 22–32)
Calcium: 7.6 mg/dL — ABNORMAL LOW (ref 8.9–10.3)
Chloride: 102 mmol/L (ref 98–111)
Creatinine, Ser: 0.57 mg/dL (ref 0.44–1.00)
GFR, Estimated: >60 mL/min (ref >60)
Glucose, Bld: 83 mg/dL (ref 70–99)
Potassium: 4 mmol/L (ref 3.5–5.1)
Sodium: 135 mmol/L (ref 135–145)
Total Bilirubin: 0.5 mg/dL (ref 0.3–1.2)
Total Protein: 5.3 g/dL — ABNORMAL LOW (ref 6.5–8.1)

## 2021-09-03 LAB — MAGNESIUM: Magnesium: 1.9 mg/dL (ref 1.7–2.4)

## 2021-09-03 LAB — TRIGLYCERIDES: Triglycerides: 44 mg/dL (ref <150)

## 2021-09-03 LAB — GLUCOSE, CAPILLARY
Glucose-Capillary: 92 mg/dL (ref 70–99)
Glucose-Capillary: 110 mg/dL — ABNORMAL HIGH (ref 70–99)
Glucose-Capillary: 101 mg/dL — ABNORMAL HIGH (ref 70–99)

## 2021-09-03 MED ORDER — LEVOTHYROXINE SODIUM 150 MCG PO TABS: 150.0000 ug | ORAL_TABLET | Freq: Every day | ORAL | Status: DC

## 2021-09-03 MED ORDER — ENOXAPARIN SODIUM 80 MG/0.8ML IJ SOSY
0.5000 mg/kg | PREFILLED_SYRINGE | INTRAMUSCULAR | Status: DC
Start: 2021-09-03 — End: 2021-09-03

## 2021-09-03 MED ORDER — AMIODARONE HCL 200 MG PO TABS: 200.0000 mg | ORAL_TABLET | Freq: Every day | ORAL | Status: DC

## 2021-09-03 MED ORDER — DIPHENHYDRAMINE HCL 12.5 MG/5ML PO ELIX: 12.5000 mg | ORAL_SOLUTION | Freq: Four times a day (QID) | ORAL | 0 refills | Status: DC | PRN

## 2021-09-03 MED ORDER — RIVAROXABAN 20 MG PO TABS
20.0000 mg | ORAL_TABLET | Freq: Every day | ORAL | Status: DC
  Administered 2021-09-03 – 2021-09-06 (×4): 20 mg via ORAL
  Filled 2021-09-03 (×4): qty 1

## 2021-09-03 MED ORDER — RIVAROXABAN 20 MG PO TABS: 20.0000 mg | ORAL_TABLET | Freq: Every day | ORAL | Status: DC

## 2021-09-03 MED ORDER — FUROSEMIDE 40 MG PO TABS: 40.0000 mg | ORAL_TABLET | Freq: Every day | ORAL | 11 refills | Status: DC

## 2021-09-03 MED ORDER — ZINC OXIDE 40 % EX OINT: TOPICAL_OINTMENT | CUTANEOUS | 0 refills | Status: AC | PRN

## 2021-09-03 MED ORDER — CITALOPRAM HYDROBROMIDE 20 MG PO TABS: 20.0000 mg | ORAL_TABLET | Freq: Every day | ORAL | Status: DC

## 2021-09-03 MED ORDER — ENSURE ENLIVE PO LIQD: 237.0000 mL | Freq: Three times a day (TID) | ORAL | 12 refills | Status: DC

## 2021-09-03 MED ORDER — LORAZEPAM 1 MG PO TABS: 1.0000 mg | ORAL_TABLET | Freq: Four times a day (QID) | ORAL | 0 refills | Status: DC | PRN

## 2021-09-03 MED ORDER — IPRATROPIUM-ALBUTEROL 0.5-2.5 (3) MG/3ML IN SOLN
3.0000 mL | RESPIRATORY_TRACT | Status: DC | PRN
Start: 2021-09-03 — End: 2023-03-10

## 2021-09-03 MED ORDER — PANTOPRAZOLE SODIUM 40 MG PO TBEC: 40.0000 mg | DELAYED_RELEASE_TABLET | Freq: Two times a day (BID) | ORAL | Status: DC

## 2021-09-03 MED ORDER — LORATADINE 10 MG PO TABS: 10.0000 mg | ORAL_TABLET | Freq: Every day | ORAL | Status: DC

## 2021-09-03 MED ORDER — ACETAMINOPHEN 325 MG PO TABS: 650.0000 mg | ORAL_TABLET | Freq: Four times a day (QID) | ORAL | Status: AC | PRN

## 2021-09-03 MED ORDER — SODIUM CHLORIDE 0.9% FLUSH: 10.0000 mL | INTRAVENOUS | Status: DC | PRN

## 2021-09-03 MED ORDER — TRAVASOL 10 % IV SOLN
INTRAVENOUS | Status: AC
  Filled 2021-09-03: qty 748.8

## 2021-09-03 MED ORDER — SODIUM CHLORIDE 0.9% FLUSH: 10.0000 mL | Freq: Two times a day (BID) | INTRAVENOUS | Status: DC

## 2021-09-03 MED ORDER — MIRTAZAPINE 7.5 MG PO TABS: 7.5000 mg | ORAL_TABLET | Freq: Every day | ORAL | Status: DC

## 2021-09-03 MED ORDER — METOPROLOL TARTRATE 25 MG PO TABS: 12.5000 mg | ORAL_TABLET | Freq: Two times a day (BID) | ORAL | Status: DC

## 2021-09-03 MED ORDER — MAGNESIUM SULFATE 2 GM/50ML IV SOLN
2.0000 g | Freq: Once | INTRAVENOUS | Status: AC
  Administered 2021-09-03: 2 g via INTRAVENOUS
  Filled 2021-09-03: qty 50

## 2021-09-03 NOTE — Progress Notes (Addendum)
[facility] PROGRESS NOTE  Lauren [name] JJO:[id number] DOB: [birth year] DOA: 07/18/2021 PCP: Pcp, No  Status: Remains inpatient appropriate because:Unsafe d/c plan and IV treatments appropriate due to intensity of illness or inability to take PO  Dispo: The patient is from: Home              Anticipated d/c is to: [facility] healthcare center-insurance authorization pending              Patient currently is medically stable to d/c.   Difficult to place patient Yes                Barriers to discharge:  Level of care: Med-Surg   Code Status: DNR Family Communication: No family at bedside DVT prophylaxis: Lovenox COVID vaccination status: Unknown    HPI: 75 year old female patient who recently underwent right hemicolectomy on 07/06/2021 and was found to have metastatic adenoma of the right colon at home she was having abdominal pain, nausea and right-sided back pain.  She presented to [facility] with sepsis physiology that was unresponsive to fluid challenge and was placed on vasopressors.  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  She was incidentally found to be COVID-positive at time of admission.  Due to the abnormal CT findings she was treated with broad-spectrum antibiotics and general surgery was consulted. The patient was transferred to [facility].  She eventually stabilized enough to move out of the ICU and was transferred to hospitalist team on 8/27.  Follow-up CT on 9/17 with overall improvement and no signs of persistent communication of the bowel lumen and the wound in the abdomen.  She remains n.p.o. and on parenteral nutrition.  As documented fistula can take 8 to 9 weeks to close once fistula can become chronic.  Subjective: Patient sitting up in chair at bedside.  Discussed likelihood of discharge to nursing facility in the next 24 to 48 hours pending bed availability.  Patient wanted to  know how long she would likely stay and I stated at least 2 weeks.  Goal is for her to be able to bare minimum manage at her home which means navigating a wheelchair, being able to stand and sit on commode and/or sit at sink to perform a.m. care.  She would also need to be able to navigate into kitchen and be able to stand long enough to obtain food from the refrigerator and prepare her own meals.  Objective: Vitals:   09/02/21 2021 09/03/21 0338  BP: (!) 104/54 (!) 96/45  Pulse: 100 94  Resp: 20 20  Temp: 98.5 F (36.9 C) 97.9 F (36.6 C)  SpO2: 96% 97%    Intake/Output Summary (Last 24 hours) at 09/03/2021 0741 Last data filed at 09/03/2021 0600 Gross per 24 hour  Intake 3003.48 ml  Output 6600 ml  Net -3596.52 ml   Filed Weights   08/31/21 0500 09/01/21 0246 09/02/21 0600  Weight: (!) 151.9 kg (!) 151.9 kg (!) 154.3 kg    Exam:  Constitutional: NAD, calm, sitting up in chair in no further reports of nausea Respiratory: Room air, anterior lung sounds clear.  No increased work of breathing.  O2 saturations 97% Cardiovascular: Heart sounds S1-S2 without rubs or murmurs.  Normotensive.  Subtle improvement in peripheral edema after initiation of Lasix. Abdomen: Soft and currently nontender.  Eakin pouch over colocutaneous site w/ unchanged purulent and bilious appearing output.  Cyclic TPN with surgery team  planning to decrease to 50% of current rate as of 10/10.-Eating about 30 to 50% of soft diet. Neurologic: CN 2-12 grossly intact. Sensation intact, DTR normal. Strength 3-4/5 x all 4 extremities.  Reporting headache without visual disturbance Psychiatric: Alert and oriented x3.  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 New diagnosis of metastatic colon adenocarcinoma. Completed IV Zosyn on 9/6 Although recent CT on 9/17 encouraging since there remains air in output from  the Eakin's pouch surgery is concerned that the fistula has not completely closed Significantly improved so scheduled ibuprofen discontinued in favor of prn Tylenol. Continue boost breeze supplementation.   Left thigh hematoma, with acute blood loss anemia.  Resolved sp 4 units PRBCs Hemoglobin is stable between 7.9 and 8.8  Lymphedema/peripheral edema Likely from hypoalbuminemia Has responded well to IV Lasix this admission Large will resume oral Demadex with potassium supplementation   Atrial fibrillation with RVR.  Rate controlled on amiodarone and low-dose metoprolol-recent QTC 470 ms Had GI bleed back in August secondary to cecal mass which has now been removed (right hemicolectomy).  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  LV systolic function 42%, w/diastolic dysfunction 5 years ago echocardiogram was normal with EF of 55 to 60% and no evidence of diastolic dysfunction or other abnormality Given current debility lymphedema remains stable   AKI, hyponatremia, hypokalemia, hypomagnesemia, hypochloremia.  Resolved   Obesity class 3/severe protein calorie malnutrition/hypoalbuminemia Body mass index is 62.22 kg/m.  Has hypoalbuminemia with serum albumin less than 1.6 Nutrition Problem: Inadequate oral intake Etiology: altered GI function, acute illness Signs/Symptoms: NPO status Interventions: TPN   Coccyx and thigh pressure ulcer  Nursing has documented resolved as of 9/25  Physical debility PT documents making slow progress towards goals and needs significant encouragement. Morbid obesity also contributing to difficulty with ambulating and repositioning self in bed Current focus is on increasing mobility in the bed well is working on transfers to EOB and attempting to  stand. Recommendation is for SNF   Metabolic encephalopathy with delirium Resolved  Anxiety and depression,  Continue with citalopram and PRN lorazepam.   Hypothyroidism Continue levothyroxine    COVID 19 infection, Was completely asymptomatic without any pneumonia or other significant abnormality Was positive on 8/24 and has completed quarantine     Data Reviewed: Basic Metabolic Panel: Recent Labs  Lab 08/30/21 0545 09/01/21 0224 09/02/21 0437 09/03/21 0518  NA 131* 131* 130* 135  K 4.3 5.0 3.8 4.0  CL 102 105 102 102  CO2 24 20* 23 29  GLUCOSE 116* 109* 98 83  BUN 27* 31* 33* 35*  CREATININE 0.57 0.55 0.55 0.57  CALCIUM 7.8* 7.8* 7.5* 7.6*  MG 2.1  --   --  1.9  PHOS 2.7 3.0  --  4.0   Liver Function Tests: Recent Labs  Lab 08/30/21 0545 09/03/21 0518  AST 26 21  ALT 20 18  ALKPHOS 116 115  BILITOT 0.5 0.5  PROT 5.6* 5.3*  ALBUMIN 1.7* 1.7*    CBC: Recent Labs  Lab 09/01/21 0224  WBC 6.3  NEUTROABS 4.4  HGB 8.1*  HCT 27.6*  MCV 91.4  PLT 142*    CBG: Recent Labs  Lab 09/01/21 1257 09/01/21 1609 09/01/21 2100 09/02/21 0842 09/03/21 0340  GLUCAP 88 78 91 80 92      Scheduled Meds:  amiodarone  200 mg Oral Daily   Chlorhexidine Gluconate Cloth  6 each Topical Daily   citalopram  20 mg Oral Daily   enoxaparin (LOVENOX) injection  0.5 mg/kg Subcutaneous Q24H   feeding supplement  237 mL Oral TID BM   furosemide  40 mg Intravenous Daily   levothyroxine  150 mcg Oral Q0600   loratadine  10 mg Oral Daily   mouth rinse  15 mL Mouth Rinse BID   metoprolol tartrate  12.5 mg Oral BID   mirtazapine  7.5 mg Oral QHS   pantoprazole  40 mg Oral BID   sodium chloride flush  10-40 mL Intracatheter Q12H   sodium chloride flush  10-40 mL Intracatheter Q12H   Continuous Infusions:  TPN CYCLIC-ADULT (ION) 96 mL/hr at 09/03/21 0557    Principal Problem:   Septic shock ([place]) Active Problems:   Hypokalemia   Morbid obesity with BMI of  50.0-59.9, adult ([place])   Colon carcinoma ([place])   Enterocutaneous fistula   Cellulitis   AKI (acute kidney injury) ([place])   Hyponatremia   Abdominal pain   Consultants: General surgery  Procedures: Double lumen PICC placed on 9/26 to RUE  Antibiotics: Anidulafungin x1 dose on 8/24 Cefepime x1 dose on 8/24 Rocephin x1 dose on 8/24 Meropenem x1 dose on 8/24 Flagyl x1 dose on 8/24 Zosyn 8/24 through 9/6 Remdesivir x3 doses 8/25 through 8/27   Time spent: 25 minutes    Erin Hearing ANP  [facility] 7 am - 330 pm/M-F for direct patient care and secure chat Please refer to Amion for contact info 47  days

## 2021-09-03 NOTE — Progress Notes (Signed)
PHARMACY - TOTAL PARENTERAL NUTRITION CONSULT NOTE  Indication: Fistula  Patient Measurements: Height: 5\' 2"  (157.5 cm) Weight: (!) 154.3 kg (340 lb 2.7 oz) IBW/kg (Calculated) : 50.1 TPN AdjBW (KG): 75 Body mass index is 62.22 kg/m.  Assessment:  Lauren [name] with recent R-hemicolectomy on 8/12 in the setting of a cecal mass - consistent with R-colon metastatic adenocarcinoma. 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 Pharmacy consulted to manage TPN for nutritional support in the setting of a colocutaneous fistula.    Glucose / Insulin: No DM, A1C 5.4, CBGs low normal; SSI d/c'ed 08/22/21   Electrolytes: Na 135 (max in TPN), K 4 s/p KCl 40 mEq po + furosemide PO & IV > now on 40 IV daily, CO2 29 and Phos 4 up trending, Mg 1.9, CoCa 9.44, others WNL Renal: Scr 0.5, BUN wnl Hepatic: LFTs / tbili / TG WNL, albumin 1.7 Intake / Output; MIVF: UOP 1.8 ml/kg/hr, LBM 10/9, fistula 37mL GI Imaging:  8/24 CT: colocutaneous fistula, abd wall abscess, soft tissue edema, cellulitis vs nec fasciitis 8/25 Abd xray: diffuse abd wall emphysema, nonobstructive bowel gas pattern 9/4 CT: medial upper left thigh hematoma 9/17 CT: no evidence of communication between bowel and subQ tissue. Decrease volume extensive SQ gas along the RIGHT abd wall and upper groin. GI Surgeries / Procedures:  8/12 at [facility]: R-hemicolectomy 8/24: Midline wound opened by Dr. Arnoldo Morale at bedside  Central access: PICC replaced 07/27/21, exchanged 9/26. TPN start date: 07/19/21  Nutritional Goals (RD assessment on 10/7): 2300-2500 kcal, 140-160g AA, fluid >/= 2L per day  Current Nutrition:  TPN 10/7 Soft diet  10/10 calorie count pending. Per patient ,she ate an egg, bites of sausage, peaches, bite of chicken, peas, some potatoes, 1 magic cup and 1 Ensure (cannot remember what she had for lunch)  Plan:  Halve cyclic TPN over Lauren hrs (rate 48-96 ml/hr) to provide 75 AA, 156g  CHO and 35 g ILE for a total of 1178 kCal, meeting 50% of estimated needs Electrolytes in TPN: Adjusted to prevent drop with rate decrease  Na 150 mEq/L, K to 64mEq/L, Ca to 60mEq/L, Mg 71mEq/L, Phos 15 mmol/L, Cl:Ac 2:1 Add standard MVI and trace elements to TPN Stop CBGs checks  Standard TPN labs 10/11 and on Mon and Thurs Give Mag 2g IV x1  F/u dispo plans:  medically ready for discharge   Benetta Spar, PharmD, BCPS, BCCP Clinical Pharmacist  Please check AMION for all [place] phone numbers After 10:00 PM, call [place]

## 2021-09-03 NOTE — Progress Notes (Addendum)
1:15pm: CSW and RN CM spoke with patient at bedside to provide her with bed offer from [facility] - she is in agreement to discharge there once a bed becomes available. Patient did not have any questions and states she will inform her brother of discharge plan.  11am: CSW spoke with [place] at [place] who states there are no beds available today - discharges expected mid week.   CSW will not initiate insurance authorization until a bed is available.  Madilyn Fireman, MSW, LCSW Transitions of Care  Clinical Social Worker II [phone]

## 2021-09-03 NOTE — Progress Notes (Signed)
Physical Therapy Treatment Patient Details Name: Lauren [name] MRN: [id number] DOB: 10-Sep-1946 Today's Date: 09/03/2021   History of Present Illness Pt is a 75 y.o. female admitted from [facility] on 9/56/38 with hypotension and Covid (+). Pt with colocutaneous fistula, Afib and new CHf since admission. Pt also with abdominal wall cellulitis and left thigh hematoma requiring blood transfusion. Of note, recent admission (8/9-8/23) to [facility] with partially obstructing tumor in the ascending colon s/p R hemicolectomy 07/06/21. PMH includes A fib, morbid obesity, HTN, lymphedema, hypothyroidism.    PT Comments    Pt willing to participate today with improved standing trials and able to pivot to chair. Did not attempt further gait due to pt not having been up for several days and fall risk. Performed repeated sit to stands and seated HEP. Pt with excellent attitude and willingness this session. Will continue to follow.      Recommendations for follow up therapy are one component of a multi-disciplinary discharge planning process, led by the attending physician.  Recommendations may be updated based on patient status, additional functional criteria and insurance authorization.  Follow Up Recommendations  SNF;Supervision/Assistance - 24 hour     Equipment Recommendations  None recommended by PT    Recommendations for Other Services       Precautions / Restrictions Precautions Precautions: Fall Precaution Comments: BLE lymphedema, Eakin's pouch for colocutaneous fistula, incontinence Restrictions Weight Bearing Restrictions: No     Mobility  Bed Mobility Overal bed mobility: Needs Assistance Bed Mobility: Supine to Sit     Supine to sit: Mod assist;HOB elevated     General bed mobility comments: HOB 30 degrees with physical assist to scoot hips to left and shift legs to pivot to right with HHA to elevate trunk and progress transfer. EOB with mod assist to reposition hips.     Transfers Overall transfer level: Needs assistance   Transfers: Sit to/from Stand;Stand Pivot Transfers Sit to Stand: Min assist;+2 safety/equipment Stand pivot transfers: Mod assist;+2 physical assistance       General transfer comment: pt able to stand from bed with LLE blocked, RW and +2 for safety with shuffling pivot to chair toward right. PT then repeated 5 sit to stands from recliner with armrests and cues for hand placement  Ambulation/Gait                 Stairs             Wheelchair Mobility    Modified Rankin (Stroke Patients Only)       Balance Overall balance assessment: Needs assistance Sitting-balance support: Feet supported;No upper extremity supported Sitting balance-Leahy Scale: Fair Sitting balance - Comments: EOB with feet on floor   Standing balance support: Bilateral upper extremity supported;Single extremity supported Standing balance-Leahy Scale: Poor Standing balance comment: pt able to perform static standing with single and double UE support                            Cognition Arousal/Alertness: Awake/alert Behavior During Therapy: WFL for tasks assessed/performed Overall Cognitive Status: Impaired/Different from baseline Area of Impairment: Safety/judgement                       Following Commands: Follows one step commands with increased time;Follows one step commands consistently Safety/Judgement: Decreased awareness of safety;Decreased awareness of deficits   Problem Solving: Decreased initiation;Requires verbal cues;Difficulty sequencing General Comments: pt with no self-limiting behaviors this  session but needs cues and direction for mobility and progression      Exercises General Exercises - Lower Extremity Short Arc Quad: AROM;20 reps;Both;Seated Hip Flexion/Marching: AAROM;15 reps;Seated;Both    General Comments        Pertinent Vitals/Pain Pain Score: 6  Pain Location: LLE with  mobility Pain Descriptors / Indicators: Discomfort;Grimacing;Guarding Pain Intervention(s): Limited activity within patient's tolerance;Repositioned;Monitored during session    Home Living                      Prior Function            PT Goals (current goals can now be found in the care plan section) Progress towards PT goals: Progressing toward goals    Frequency    Min 2X/week      PT Plan Current plan remains appropriate    Co-evaluation              AM-PAC PT "6 Clicks" Mobility   Outcome Measure  Help needed turning from your back to your side while in a flat bed without using bedrails?: A Lot Help needed moving from lying on your back to sitting on the side of a flat bed without using bedrails?: A Lot Help needed moving to and from a bed to a chair (including a wheelchair)?: Total Help needed standing up from a chair using your arms (e.g., wheelchair or bedside chair)?: Total Help needed to walk in hospital room?: Total Help needed climbing 3-5 steps with a railing? : Total 6 Click Score: 8    End of Session Equipment Utilized During Treatment: Gait belt Activity Tolerance: Patient tolerated treatment well Patient left: in chair;with call bell/phone within reach;with chair alarm set Nurse Communication: Mobility status;Need for lift equipment PT Visit Diagnosis: Other abnormalities of gait and mobility (R26.89);Muscle weakness (generalized) (M62.81);Difficulty in walking, not elsewhere classified (R26.2);Other (comment)     Time: 9326-7124 PT Time Calculation (min) (ACUTE ONLY): 30 min  Charges:  $Therapeutic Exercise: 8-22 mins $Therapeutic Activity: 8-22 mins                     Letizia Hook P, PT Acute Rehabilitation Services Pager: [phone] Office: [place] 09/03/2021, 8:42 AM

## 2021-09-03 NOTE — Plan of Care (Signed)
  Problem: Fluid Volume: Goal: Hemodynamic stability will improve Outcome: Progressing   Problem: Clinical Measurements: Goal: Diagnostic test results will improve Outcome: Progressing Goal: Signs and symptoms of infection will decrease Outcome: Progressing   Problem: Respiratory: Goal: Ability to maintain adequate ventilation will improve Outcome: Progressing   Problem: Education: Goal: Knowledge of General Education information will improve Description: Including pain rating scale, medication(s)/side effects and non-pharmacologic comfort measures Outcome: Progressing   Problem: Health Behavior/Discharge Planning: Goal: Ability to manage health-related needs will improve Outcome: Progressing   Problem: Clinical Measurements: Goal: Ability to maintain clinical measurements within normal limits will improve Outcome: Progressing Goal: Will remain free from infection Outcome: Progressing Goal: Diagnostic test results will improve Outcome: Progressing Goal: Respiratory complications will improve Outcome: Progressing Goal: Cardiovascular complication will be avoided Outcome: Progressing   Problem: Activity: Goal: Risk for activity intolerance will decrease Outcome: Progressing   Problem: Nutrition: Goal: Adequate nutrition will be maintained Outcome: Progressing   Problem: Coping: Goal: Level of anxiety will decrease Outcome: Progressing   Problem: Elimination: Goal: Will not experience complications related to bowel motility Outcome: Progressing Goal: Will not experience complications related to urinary retention Outcome: Progressing   Problem: Pain Managment: Goal: General experience of comfort will improve Outcome: Progressing   Problem: Safety: Goal: Ability to remain free from injury will improve Outcome: Progressing   Problem: Skin Integrity: Goal: Risk for impaired skin integrity will decrease Outcome: Progressing   Problem: Education: Goal:  Knowledge about tracheostomy care/management will improve Outcome: Progressing   Problem: Activity: Goal: Ability to tolerate increased activity will improve Outcome: Progressing   Problem: Health Behavior/Discharge Planning: Goal: Ability to manage tracheostomy will improve Outcome: Progressing   Problem: Respiratory: Goal: Patent airway maintenance will improve Outcome: Progressing   Problem: Role Relationship: Goal: Ability to communicate will improve Outcome: Progressing

## 2021-09-03 NOTE — Progress Notes (Signed)
Progress Note     Subjective: Patient is sitting up in chair. She is concerned that she will be left in chair for > 1hr. She is working on improving PO intake.   Objective: Vital signs in last 24 hours: Temp:  [97.9 F (36.6 C)-98.5 F (36.9 C)] 97.9 F (36.6 C) (10/10 0338) Pulse Rate:  [94-100] 94 (10/10 0338) Resp:  [20] 20 (10/10 0338) BP: (96-117)/(45-71) 96/45 (10/10 0338) SpO2:  [96 %-99 %] 97 % (10/10 0338) Last BM Date: 09/02/21  Intake/Output from previous day: 10/09 0701 - 10/10 0700 In: 3003.5 [P.O.:1560; I.V.:1443.5] Out: 6600 [Urine:6600] Intake/Output this shift: No intake/output data recorded.  PE: General appearance: alert and cooperative Resp: normal effort Cardio: regular rate and rhythm GI: soft, minimal tenderness. Scant fluid in Eakin's this AM   Lab Results:  Recent Labs    09/01/21 0224  WBC 6.3  HGB 8.1*  HCT 27.6*  PLT 142*   BMET Recent Labs    09/02/21 0437 09/03/21 0518  NA 130* 135  K 3.8 4.0  CL 102 102  CO2 23 29  GLUCOSE 98 83  BUN 33* 35*  CREATININE 0.55 0.57  CALCIUM 7.5* 7.6*   PT/INR No results for input(s): LABPROT, INR in the last 72 hours. CMP     Component Value Date/Time   NA 135 09/03/2021 0518   K 4.0 09/03/2021 0518   CL 102 09/03/2021 0518   CO2 29 09/03/2021 0518   GLUCOSE 83 09/03/2021 0518   BUN 35 (H) 09/03/2021 0518   CREATININE 0.57 09/03/2021 0518   CALCIUM 7.6 (L) 09/03/2021 0518   PROT 5.3 (L) 09/03/2021 0518   ALBUMIN 1.7 (L) 09/03/2021 0518   AST 21 09/03/2021 0518   ALT 18 09/03/2021 0518   ALKPHOS 115 09/03/2021 0518   BILITOT 0.5 09/03/2021 0518   GFRNONAA >60 09/03/2021 0518   GFRAA >90 03/28/2014 0435   Lipase     Component Value Date/Time   LIPASE 25 03/27/2014 0600       Studies/Results: No results found.  Anti-infectives: Anti-infectives (From admission, onward)    Start     Dose/Rate Route Frequency Ordered Stop   08/12/21 0415  clindamycin (CLEOCIN) IVPB  600 mg        600 mg 100 mL/hr over 30 Minutes Intravenous  Once 08/12/21 0317 08/12/21 0434   07/20/21 1000  remdesivir 100 mg in sodium chloride 0.9 % 100 mL IVPB       See Hyperspace for full Linked Orders Report.   100 mg 200 mL/hr over 30 Minutes Intravenous Daily 07/19/21 1040 07/21/21 1148   07/20/21 0200  vancomycin (VANCOREADY) IVPB 750 mg/150 mL  Status:  Discontinued        750 mg 150 mL/hr over 60 Minutes Intravenous Every 24 hours 07/19/21 1036 07/19/21 1449   07/19/21 2300  anidulafungin (ERAXIS) 100 mg in sodium chloride 0.9 % 100 mL IVPB  Status:  Discontinued        100 mg 78 mL/hr over 100 Minutes Intravenous Every 24 hours 07/18/21 2107 07/19/21 1449   07/19/21 1600  vancomycin (VANCOREADY) IVPB 750 mg/150 mL  Status:  Discontinued        750 mg 150 mL/hr over 60 Minutes Intravenous Every 24 hours 07/19/21 0919 07/19/21 1016   07/19/21 1600  vancomycin (VANCOREADY) IVPB 750 mg/150 mL  Status:  Discontinued        750 mg 150 mL/hr over 60 Minutes Intravenous Every 24 hours 07/19/21  1016 07/19/21 1036   07/19/21 1400  piperacillin-tazobactam (ZOSYN) IVPB 3.375 g  Status:  Discontinued        3.375 g 12.5 mL/hr over 240 Minutes Intravenous Every 8 hours 07/19/21 1016 07/31/21 1501   07/19/21 1130  remdesivir 200 mg in sodium chloride 0.9% 250 mL IVPB       See Hyperspace for full Linked Orders Report.   200 mg 580 mL/hr over 30 Minutes Intravenous Once 07/19/21 1040 07/19/21 1241   07/19/21 0100  metroNIDAZOLE (FLAGYL) IVPB 500 mg  Status:  Discontinued        500 mg 100 mL/hr over 60 Minutes Intravenous Every 12 hours 07/18/21 1203 07/18/21 1534   07/18/21 2200  ceFEPIme (MAXIPIME) 2 g in sodium chloride 0.9 % 100 mL IVPB  Status:  Discontinued        2 g 200 mL/hr over 30 Minutes Intravenous Every 12 hours 07/18/21 1156 07/18/21 1203   07/18/21 2200  meropenem (MERREM) 2 g in sodium chloride 0.9 % 100 mL IVPB  Status:  Discontinued        2 g 200 mL/hr over 30  Minutes Intravenous Every 12 hours 07/18/21 2104 07/19/21 1011   07/18/21 2200  anidulafungin (ERAXIS) 200 mg in sodium chloride 0.9 % 200 mL IVPB        200 mg 78 mL/hr over 200 Minutes Intravenous  Once 07/18/21 2108 07/19/21 0419   07/18/21 1800  clindamycin (CLEOCIN) IVPB 900 mg  Status:  Discontinued        900 mg 100 mL/hr over 30 Minutes Intravenous Every 8 hours 07/18/21 1534 07/18/21 2103   07/18/21 1600  vancomycin (VANCOREADY) IVPB 2000 mg/400 mL  Status:  Discontinued        2,000 mg 200 mL/hr over 120 Minutes Intravenous Every 24 hours 07/18/21 1544 07/19/21 0919   07/18/21 1545  piperacillin-tazobactam (ZOSYN) IVPB 3.375 g  Status:  Discontinued        3.375 g 12.5 mL/hr over 240 Minutes Intravenous Every 8 hours 07/18/21 1544 07/18/21 2104   07/18/21 1300  cefTRIAXone (ROCEPHIN) 2 g in sodium chloride 0.9 % 100 mL IVPB  Status:  Discontinued        2 g 200 mL/hr over 30 Minutes Intravenous Every 24 hours 07/18/21 1203 07/18/21 1628   07/18/21 1200  ceFEPIme (MAXIPIME) 2 g in sodium chloride 0.9 % 100 mL IVPB  Status:  Discontinued        2 g 200 mL/hr over 30 Minutes Intravenous Every 12 hours 07/18/21 1155 07/18/21 1156   07/18/21 0930  ceFEPIme (MAXIPIME) 2 g in sodium chloride 0.9 % 100 mL IVPB        2 g 200 mL/hr over 30 Minutes Intravenous  Once 07/18/21 0918 07/18/21 1135   07/18/21 0930  metroNIDAZOLE (FLAGYL) IVPB 500 mg  Status:  Discontinued        500 mg 100 mL/hr over 60 Minutes Intravenous  Once 07/18/21 2841 07/18/21 1534        Assessment/Plan Colocutaneous fistula after R hemicolectomy at OSH - Hx of R hemicolectomy by Dr. Arnoldo Morale at [facility] on 8/12 - path w/ adenocarcinoma. Oncology has seen on 9/5 - Midline wound opened by Dr. Arnoldo Morale at bedside 8/24 which had stool decompressing through CC fistula  - Currently has eakin's pouch - 30 cc out recorded for overnight - CT 9/17 w/ overall improvement and no sign of persistent communication between bowel  lumen and wound  - Although imaging reassuring,  given air and output still in eakin's pouch I am concerned the CC fistula has not completely closed. Made NPO again on 9/20.  - Appreciate palliative assistance with [facility], last seen 9/28 - patient started on liquids 10/3 and output has remained low, advanced to FLD 10/6 and output remained low, advance to soft diet 10/7 and output continues to be low - calorie count in process - patient is stable for discharge to SNF on TPN from a surgical standpoint and can follow up with Dr. Arnoldo Morale. If patient able to take in enough calories, may even be able to discontinue TPN. Defer outpatient management of CC fistula to Dr. Arnoldo Morale   FEN - soft diet, 1/2 TPN VTE - SCDs, Lovenox, per primary  ID - None currently.    COVID + A. Fib on Xarelto at home  ABL anemia - FOBT positive 8/24 Hypothyroidism Severe protein calorie malnutrition - albumin 1.7 from 1.3, encourage PO intake and start to wean TPN  LOS: 47 days    Norm Parcel, [facility] 09/03/2021, 8:58 AM Please see Amion for pager number during day hours 7:00am-4:30pm

## 2021-09-03 NOTE — Progress Notes (Signed)
Calorie Count Note  48-72 hour calorie count ordered.  Diet: Soft Supplements: Ensure TID, Magic Cup TID  Day 1 - 10/8: Breakfast: 320 kcal, 3 grams protein Lunch: 65 kcal, 6 grams protein Dinner: 80 kcal, 3 grams protein Supplements: 580 kcal, 18 grams protein (2 Magic Cups)  Day 2 - 10/9: Breakfast: 170 kcal, 9 grams protein Lunch: None ordered Dinner: 104 kcal, 5 grams protein Supplements: 990 kcal, 49 grams protein (2 Ensures, 1 Magic Cup)  Total intake: 1155 kcal (50% of minimum estimated needs)  93 grams of protein (33% of minimum estimated needs)  Nutrition Dx: Inadequate oral intake related to altered GI function, acute illness as evidenced by NPO status. - ongoing  Goal: Patient will meet greater than or equal to 90% of their needs. - not meeting PO, met with TPN  Intervention:  Discontinue calorie count. Continue TPN - consider meeting 75% of caloric and protein needs. Continue Ensure TID. Continue Magic Cup TID. Continue to encourage PO intake to wean TPN.  Derrel Nip, RD, LDN (she/her/hers) Registered Dietitian I After-Hours/Weekend Pager # in [place]

## 2021-09-04 DIAGNOSIS — A419 Sepsis, unspecified organism: Secondary | ICD-10-CM | POA: Diagnosis not present

## 2021-09-04 DIAGNOSIS — R6521 Severe sepsis with septic shock: Secondary | ICD-10-CM | POA: Diagnosis not present

## 2021-09-04 LAB — GLUCOSE, CAPILLARY
Glucose-Capillary: 102 mg/dL — ABNORMAL HIGH (ref 70–99)
Glucose-Capillary: 99 mg/dL (ref 70–99)

## 2021-09-04 LAB — PHOSPHORUS: Phosphorus: 3.6 mg/dL (ref 2.5–4.6)

## 2021-09-04 LAB — BASIC METABOLIC PANEL
Anion gap: 7 (ref 5–15)
BUN: 28 mg/dL — ABNORMAL HIGH (ref 8–23)
CO2: 28 mmol/L (ref 22–32)
Calcium: 7.8 mg/dL — ABNORMAL LOW (ref 8.9–10.3)
Chloride: 98 mmol/L (ref 98–111)
Creatinine, Ser: 0.52 mg/dL (ref 0.44–1.00)
GFR, Estimated: >60 mL/min (ref >60)
Glucose, Bld: 115 mg/dL — ABNORMAL HIGH (ref 70–99)
Potassium: 3.8 mmol/L (ref 3.5–5.1)
Sodium: 133 mmol/L — ABNORMAL LOW (ref 135–145)

## 2021-09-04 LAB — MAGNESIUM: Magnesium: 2 mg/dL (ref 1.7–2.4)

## 2021-09-04 MED ORDER — POTASSIUM CHLORIDE CRYS ER 20 MEQ PO TBCR
40.0000 meq | EXTENDED_RELEASE_TABLET | Freq: Once | ORAL | Status: AC
  Administered 2021-09-04: 40 meq via ORAL
  Filled 2021-09-04: qty 2

## 2021-09-04 MED ORDER — TRAVASOL 10 % IV SOLN
INTRAVENOUS | Status: AC
  Filled 2021-09-04: qty 748.8

## 2021-09-04 NOTE — Progress Notes (Signed)
Orthopedic Tech Progress Note Patient Details:  Lauren [name] [birth year] [id number]  Ortho Devices Type of Ortho Device: Haematologist Ortho Device/Splint Location: BLE Ortho Device/Splint Interventions: Ordered, Application   Post Interventions Patient Tolerated: Well  Lestine Rahe A Jaret Coppedge 09/04/2021, 2:20 PM

## 2021-09-04 NOTE — Progress Notes (Signed)
PHARMACY - TOTAL PARENTERAL NUTRITION CONSULT NOTE  Indication: Fistula  Patient Measurements: Height: 5\' 2"  (157.5 cm) Weight: (!) 154.3 kg (340 lb 2.7 oz) IBW/kg (Calculated) : 50.1 TPN AdjBW (KG): 75 Body mass index is 62.22 kg/m.  Assessment:  54 YOF with recent R-hemicolectomy on 8/12 in the setting of a cecal mass - consistent with R-colon metastatic adenocarcinoma. 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 Pharmacy consulted to manage TPN for nutritional support in the setting of a colocutaneous fistula.    Glucose / Insulin: No DM, A1C 5.4, CBGs low normal; SSI d/c'ed 08/22/21   Electrolytes: Na 133 (max in TPN), K 3.8 on furosemide IV daily, CoCa 9.64, others WNL Renal: Scr 0.5, BUN 28 down  Hepatic: LFTs / tbili / TG WNL, albumin 1.7 Intake / Output; MIVF: UOP 1.1 ml/kg/hr, LBM 10/9, fistula 65mL charted GI Imaging:  8/24 CT: colocutaneous fistula, abd wall abscess, soft tissue edema, cellulitis vs nec fasciitis 8/25 Abd xray: diffuse abd wall emphysema, nonobstructive bowel gas pattern 9/4 CT: medial upper left thigh hematoma 9/17 CT: no evidence of communication between bowel and subQ tissue. Decrease volume extensive SQ gas along the RIGHT abd wall and upper groin. GI Surgeries / Procedures:  8/12 at [facility]: R-hemicolectomy 8/24: Midline wound opened by Dr. Arnoldo Morale at bedside  Central access: PICC replaced 07/27/21, exchanged 9/26. TPN start date: 07/19/21  Nutritional Goals (RD assessment on 10/7): 2300-2500 kcal, 140-160g AA, fluid >/= 2L per day  Current Nutrition:  TPN 10/7 Soft diet  10/10 calorie count eating 1155 kcal (50% goal), 93g protein (33% goal) per day. Per patient, ate an egg, bites of sausage, peaches, bite of chicken, peas, some potatoes, 1 magic cup and 1 Ensure (cannot remember what she had for lunch) 10/11 patient does not like to eat meat. Ordered blueberry muffin and hot chocolate  Plan:   Cycle TPN over 10 hrs (rate 48-96 ml/hr, at half of goal, GIR < 2 mg/kg/min) to provide 75 AA, 156g CHO and 35 g ILE for a total of 1178 kCal, meeting 50% of estimated needs Electrolytes in TPN: Continue Na 150 mEq/L, K to 48mEq/L (Unable to add more to bag due to volume), Ca 34mEq/L, Mg 34mEq/L, Phos 15 mmol/L, Cl:Ac 2:1 Add standard MVI and trace elements to TPN Standard TPN labs on Mon and Thurs Give Kcl 40 mEq PO x1  F/u dispo plans: medically ready for discharge   Benetta Spar, PharmD, BCPS, BCCP Clinical Pharmacist  Please check AMION for all [place] phone numbers After 10:00 PM, call [place]

## 2021-09-04 NOTE — Progress Notes (Signed)
Orthopedic Tech Progress Note Patient Details:  Lauren [name] Aug 06, 1946 [id number]  Ortho Devices Type of Ortho Device: Haematologist Ortho Device/Splint Location: Bilateral Ortho Device/Splint Interventions: Ordered, Application, Adjustment   Post Interventions Patient Tolerated: Well  [name] 09/04/2021, 1:37 PM

## 2021-09-04 NOTE — Progress Notes (Signed)
CSW spoke with [place] at [facility] who states the facility does not have beds available today, but requested updated clinicals. CSW faxed clinicals to [phone].  Madilyn Fireman, MSW, LCSW Transitions of Care  Clinical Social Worker II [phone]

## 2021-09-04 NOTE — Progress Notes (Signed)
[facility] PROGRESS NOTE  Lauren [name] CHY:[id number] DOB: 05/15/1946 DOA: 07/18/2021 PCP: Pcp, No  Status: Remains inpatient appropriate because:Unsafe d/c plan and IV treatments appropriate due to intensity of illness or inability to take PO  Dispo: The patient is from: Home              Anticipated d/c is to: [facility]-insurance authorization pending              Patient currently is medically stable to d/c.   Difficult to place patient Yes                Barriers to discharge:  Level of care: Med-Surg   Code Status: DNR Family Communication: No family at bedside DVT prophylaxis: Lovenox COVID vaccination status: Unknown    HPI: 75 year old female patient who recently underwent right hemicolectomy on 07/06/2021 and was found to have metastatic adenoma of the right colon at home she was having abdominal pain, nausea and right-sided back pain.  She presented to [facility] with sepsis physiology that was unresponsive to fluid challenge and was placed on vasopressors.  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  She was incidentally found to be COVID-positive at time of admission.  Due to the abnormal CT findings she was treated with broad-spectrum antibiotics and general surgery was consulted. The patient was transferred to [facility].  She eventually stabilized enough to move out of the ICU and was transferred to hospitalist team on 8/27.  Follow-up CT on 9/17 with overall improvement and no signs of persistent communication of the bowel lumen and the wound in the abdomen.  She remains n.p.o. and on parenteral nutrition.  As documented fistula can take 8 to 9 weeks to close once fistula can become chronic.  Subjective: Awake.  Wanted to know if today would be her discharge date and I told her we were still waiting for a bed to become available at her preferred facility.  She told me  today that she does not like meat.  She requested blueberry muffin and hot chocolate -bedside nurse made aware and will obtain.  Objective: Vitals:   09/03/21 1620 09/03/21 2211  BP: (!) 100/55 106/63  Pulse: 100 88  Resp: 17   Temp:  98.2 F (36.8 C)  SpO2: 96% 97%    Intake/Output Summary (Last 24 hours) at 09/04/2021 0726 Last data filed at 09/04/2021 8786 Gross per 24 hour  Intake 1320 ml  Output 4200 ml  Net -2880 ml   Filed Weights   08/31/21 0500 09/01/21 0246 09/02/21 0600  Weight: (!) 151.9 kg (!) 151.9 kg (!) 154.3 kg    Exam:  Constitutional: NAD, calm Respiratory: Stable on room air without increased work of breathing.  Tolerating semirecumbent position.  Pulse oximetry 97% Cardiovascular: S1-S2 without rubs or murmurs.  Normotensive.  Subtle improvement in peripheral edema after initiation of Lasix.  Has significant lymphedema with blistering to legs especially LLE (see pictures below) Abdomen: Soft and currently nontender.  Eakin pouch over colocutaneous site w/ unchanged purulent and bilious appearing output.  Cyclic TPN at one half prior rate.  Continues with poor oral intake and patient reports today she prefers not to eat meat. Neurologic: CN 2-12 grossly intact. Sensation intact, DTR normal. Strength 3-4/5 x all 4 extremities.   Psychiatric: Alert and oriented x3.  Sent.  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 New diagnosis of metastatic colon adenocarcinoma. Completed IV Zosyn on 9/6 Although recent CT on 9/17 encouraging since there remains air in output from the Eakin's pouch surgery is concerned that the fistula has not completely closed Significantly improved so scheduled ibuprofen discontinued in favor of prn Tylenol. Continue boost breeze supplementation.   Left thigh hematoma, with acute blood loss anemia.  Resolved sp 4 units  PRBCs Hemoglobin is stable between 7.9 and 8.8  Lymphedema/peripheral edema Likely from hypoalbuminemia Has responded well to IV Lasix this admission At discharge will resume preadmission Demadex with potassium supplementation     09/04/2021   Atrial fibrillation with RVR.  Rate controlled on amiodarone and low-dose metoprolol-recent QTC 470 ms Had GI bleed back in August secondary to cecal mass which has now been removed (right hemicolectomy).  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  LV systolic function 26%, w/diastolic dysfunction 5 years ago echocardiogram was normal with EF of 55 to 60% and no evidence of diastolic dysfunction or other abnormality Given current debility lymphedema remains stable   AKI, hyponatremia, hypokalemia, hypomagnesemia, hypochloremia.  Resolved   Obesity class 3/severe protein calorie malnutrition/hypoalbuminemia Body mass index is 62.22 kg/m.  Has hypoalbuminemia with serum albumin less than 1.6 Nutrition Problem: Inadequate oral intake Etiology: altered GI function, acute illness Signs/Symptoms: NPO status Interventions: TPN   Coccyx and thigh pressure ulcer  Nursing has documented resolved as of 9/25  Physical debility PT documents making slow progress towards goals and needs significant encouragement. Morbid obesity also contributing to difficulty with ambulating and repositioning self in bed Current focus is on increasing mobility in the bed well is working on transfers to EOB and attempting to stand. Recommendation is for SNF   Metabolic encephalopathy with delirium Resolved  Anxiety and depression,  Continue with citalopram and PRN lorazepam.   Hypothyroidism Continue levothyroxine    COVID 19 infection, Was completely asymptomatic  without any pneumonia or other significant abnormality Was positive on 8/24 and has completed quarantine     Data Reviewed: Basic Metabolic Panel: Recent Labs  Lab 08/30/21 0545 09/01/21 0224 09/02/21 0437 09/03/21 0518 09/04/21 0202  NA 131* 131* 130* 135 133*  K 4.3 5.0 3.8 4.0 3.8  CL 102 105 102 102 98  CO2 24 20* 23 29 28   GLUCOSE 116* 109* 98 83 115*  BUN 27* 31* 33* 35* 28*  CREATININE 0.57 0.55 0.55 0.57 0.52  CALCIUM 7.8* 7.8* 7.5* 7.6* 7.8*  MG 2.1  --   --  1.9 2.0  PHOS 2.7 3.0  --  4.0 3.6   Liver Function Tests: Recent Labs  Lab 08/30/21 0545 09/03/21 0518  AST 26 21  ALT 20 18  ALKPHOS 116 115  BILITOT 0.5 0.5  PROT 5.6* 5.3*  ALBUMIN 1.7* 1.7*    CBC: Recent Labs  Lab 09/01/21 0224  WBC 6.3  NEUTROABS 4.4  HGB 8.1*  HCT 27.6*  MCV 91.4  PLT 142*    CBG: Recent Labs  Lab 09/01/21 2100 09/02/21 0842 09/03/21 0340 09/03/21 0750 09/03/21 1152  GLUCAP 91 80 92 110* 101*      Scheduled Meds:  amiodarone  200 mg Oral Daily   Chlorhexidine Gluconate Cloth  6 each Topical Daily   citalopram  20 mg Oral Daily  feeding supplement  237 mL Oral TID BM   furosemide  40 mg Intravenous Daily   levothyroxine  150 mcg Oral Q0600   loratadine  10 mg Oral Daily   mouth rinse  15 mL Mouth Rinse BID   metoprolol tartrate  12.5 mg Oral BID   mirtazapine  7.5 mg Oral QHS   pantoprazole  40 mg Oral BID   rivaroxaban  20 mg Oral Q supper   sodium chloride flush  10-40 mL Intracatheter Q12H   sodium chloride flush  10-40 mL Intracatheter Q12H   Continuous Infusions:  TPN CYCLIC-ADULT (ION) 48 mL/hr at 09/04/21 3744    Principal Problem:   Septic shock ([place]) Active Problems:   Hypokalemia   Morbid obesity with BMI of 50.0-59.9, adult ([place])   Colon carcinoma ([place])   Enterocutaneous fistula   Cellulitis   AKI (acute kidney injury) ([place])   Hyponatremia   Abdominal pain   Consultants: General surgery  Procedures: Double lumen PICC  placed on 9/26 to RUE  Antibiotics: Anidulafungin x1 dose on 8/24 Cefepime x1 dose on 8/24 Rocephin x1 dose on 8/24 Meropenem x1 dose on 8/24 Flagyl x1 dose on 8/24 Zosyn 8/24 through 9/6 Remdesivir x3 doses 8/25 through 8/27   Time spent: 25 minutes    Erin Hearing ANP  [facility] 7 am - 330 pm/M-F for direct patient care and secure chat Please refer to Amion for contact info 48  days

## 2021-09-05 DIAGNOSIS — R6521 Severe sepsis with septic shock: Secondary | ICD-10-CM | POA: Diagnosis not present

## 2021-09-05 DIAGNOSIS — A419 Sepsis, unspecified organism: Secondary | ICD-10-CM | POA: Diagnosis not present

## 2021-09-05 MED ORDER — ADULT MULTIVITAMIN W/MINERALS CH
1.0000 | ORAL_TABLET | Freq: Every day | ORAL | Status: DC
  Administered 2021-09-05 – 2021-09-07 (×3): 1 via ORAL
  Filled 2021-09-05 (×3): qty 1

## 2021-09-05 NOTE — Progress Notes (Signed)
[facility] PROGRESS NOTE  DLISA BARNWELL OQH:[id number] DOB: 1945-12-13 DOA: 07/18/2021 PCP: Pcp, No  Status: Remains inpatient appropriate because:Unsafe d/c plan and IV treatments appropriate due to intensity of illness or inability to take PO  Dispo: The patient is from: Home              Anticipated d/c is to: [facility] center-insurance authorization pending              Patient currently is medically stable to d/c.   Difficult to place patient Yes                Barriers to discharge: Bed availability-currently will be weaning TPN to off as of a.m. 10/13  Level of care: Med-Surg   Code Status: DNR Family Communication: No family at bedside DVT prophylaxis: Lovenox COVID vaccination status: Unknown    HPI: 75 year old female patient who recently underwent right hemicolectomy on 07/06/2021 and was found to have metastatic adenoma of the right colon at home she was having abdominal pain, nausea and right-sided back pain.  She presented to [facility] with sepsis physiology that was unresponsive to fluid challenge and was placed on vasopressors.  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  She was incidentally found to be COVID-positive at time of admission.  Due to the abnormal CT findings she was treated with broad-spectrum antibiotics and general surgery was consulted. The patient was transferred to [facility].  She eventually stabilized enough to move out of the ICU and was transferred to hospitalist team on 8/27.  Follow-up CT on 9/17 with overall improvement and no signs of persistent communication of the bowel lumen and the wound in the abdomen.  She remains n.p.o. and on parenteral nutrition.  As documented fistula can take 8 to 9 weeks to close once fistula can become chronic.  Subjective: Patient very sleepy.  Requesting egg sandwich with mayonnaise and a slice of tomato.  RN at bedside  and will try to see if cafeteria can accommodate.  States that once patient's food preferences were identified that her intake has improved dramatically noting for lunch and supper yesterday she ate 100%.  Later I was informed that she ate almost 100% of her breakfast.  Objective: Vitals:   09/04/21 2005 09/05/21 0409  BP: 110/63 (!) 109/54  Pulse: (!) 51 (!) 48  Resp: 19 19  Temp: 98 F (36.7 C) 98.1 F (36.7 C)  SpO2: 99% 93%    Intake/Output Summary (Last 24 hours) at 09/05/2021 0736 Last data filed at 09/05/2021 0058 Gross per 24 hour  Intake 240 ml  Output 3500 ml  Net -3260 ml   Filed Weights   08/31/21 0500 09/01/21 0246 09/02/21 0600  Weight: (!) 151.9 kg (!) 151.9 kg (!) 154.3 kg    Exam:  Constitutional: NAD, calm-drowsy which is her baseline in the a.m. Respiratory:.  Lung sounds remain clear, stable on room air without any increased work of breathing.  Pulse oximetry 97% Cardiovascular: Normal heart sounds upon auscultation, pulse irregular today minimal tachycardia, no obvious JVD but has short obese neck so difficult to accurately assess.  Normotensive.  Skin pale but warm and dry.  Has significant lymphedema with blistering to legs especially LLE (see pictures below)-bilateral UNNA boots in place Abdomen: Soft and nontender.  Eakin pouch over cutaneous fistula site with only a small amount of greenish tinged opaque drainage.  Bowel sounds are present.  Neurologic: CN 2-12 grossly intact. Sensation intact, DTR normal. Strength 3-4/5 x all 4 extremities.   Psychiatric: Awakens but remains sleepy which is her early morning baseline.  Pleasant when briefly awakened.  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 New diagnosis of metastatic colon adenocarcinoma. Completed IV Zosyn on 9/6 Although recent CT on 9/17 showed improvement and patient subsequently has been  advanced to soft diet.  She is now having bowel movements and intake has improved Plan to discontinue TPN after today's bag infused Patient intake improved secondary to identifying food preferences Continue boost breeze supplementation.   Left thigh hematoma, with acute blood loss anemia.  Resolved-s/p 4 units PRBCs Hemoglobin is stable between 7.9 and 8.8  Lymphedema/peripheral edema Has responded well to IV Lasix this admission At discharge will resume preadmission Demadex with potassium supplementation     09/04/2021   Atrial fibrillation with RVR.  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  LV systolic function 02%, w/diastolic dysfunction 5 years ago echocardiogram was normal with EF of 55 to 60% and no evidence of diastolic dysfunction or other abnormality Given current debility lymphedema remains stable-continue IV Lasix during hospitalization and transition back to [facility] upon discharge   AKI, hyponatremia, hypokalemia, hypomagnesemia, hypochloremia.  Resolved   Obesity class 3/severe protein calorie malnutrition/hypoalbuminemia Body mass index is 62.22 kg/m.  Has hypoalbuminemia with serum albumin less than 1.6 Nutrition Problem: Inadequate oral intake Etiology: altered GI function, acute illness Signs/Symptoms: NPO status Interventions: TPN   Coccyx and thigh pressure ulcer  Nursing has documented resolved as of 9/25  Physical debility PT documents making slow progress towards goals and needs significant encouragement. Morbid obesity also contributing to difficulty with ambulating and repositioning self in bed Current focus is on increasing mobility in the bed well is working on transfers to EOB and attempting to stand. Recommendation is for SNF   Metabolic encephalopathy with delirium Resolved  Anxiety and depression,  Continue with  citalopram and PRN lorazepam.   Hypothyroidism Continue levothyroxine    COVID 19 infection, Was completely asymptomatic without any pneumonia or other significant abnormality Was positive on 8/24 and has completed quarantine     Data Reviewed: Basic Metabolic Panel: Recent Labs  Lab 08/30/21 0545 09/01/21 0224 09/02/21 0437 09/03/21 0518 09/04/21 0202  NA 131* 131* 130* 135 133*  K 4.3 5.0 3.8 4.0 3.8  CL 102 105 102 102 98  CO2 24 20* 23 29 28   GLUCOSE 116* 109* 98 83 115*  BUN 27* 31* 33* 35* 28*  CREATININE 0.57 0.55 0.55 0.57 0.52  CALCIUM 7.8* 7.8* 7.5* 7.6* 7.8*  MG 2.1  --   --  1.9 2.0  PHOS 2.7 3.0  --  4.0 3.6   Liver Function Tests: Recent Labs  Lab 08/30/21 0545 09/03/21 0518  AST 26 21  ALT 20 18  ALKPHOS 116 115  BILITOT 0.5 0.5  PROT 5.6* 5.3*  ALBUMIN 1.7* 1.7*    CBC: Recent Labs  Lab 09/01/21 0224  WBC 6.3  NEUTROABS 4.4  HGB 8.1*  HCT 27.6*  MCV 91.4  PLT 142*    CBG: Recent Labs  Lab 09/03/21 0340 09/03/21 0750 09/03/21 1152 09/04/21 0824 09/04/21 1603  GLUCAP 92 110* 101* 102* 99      Scheduled Meds:  amiodarone  200 mg Oral Daily   Chlorhexidine Gluconate  Cloth  6 each Topical Daily   citalopram  20 mg Oral Daily   feeding supplement  237 mL Oral TID BM   furosemide  40 mg Intravenous Daily   levothyroxine  150 mcg Oral Q0600   loratadine  10 mg Oral Daily   mouth rinse  15 mL Mouth Rinse BID   metoprolol tartrate  12.5 mg Oral BID   mirtazapine  7.5 mg Oral QHS   pantoprazole  40 mg Oral BID   rivaroxaban  20 mg Oral Q supper   sodium chloride flush  10-40 mL Intracatheter Q12H   sodium chloride flush  10-40 mL Intracatheter Q12H   Continuous Infusions:    Principal Problem:   Septic shock (HCC) Active Problems:   Hypokalemia   Morbid obesity with BMI of 50.0-59.9, adult ([place])   Colon carcinoma ([place])   Enterocutaneous fistula   Cellulitis   AKI (acute kidney injury) ([place])   Hyponatremia    Abdominal pain   Consultants: General surgery  Procedures: Double lumen PICC placed on 9/26 to RUE  Antibiotics: Anidulafungin x1 dose on 8/24 Cefepime x1 dose on 8/24 Rocephin x1 dose on 8/24 Meropenem x1 dose on 8/24 Flagyl x1 dose on 8/24 Zosyn 8/24 through 9/6 Remdesivir x3 doses 8/25 through 8/27   Time spent: 25 minutes    Erin Hearing ANP  [facility] 7 am - 330 pm/M-F for direct patient care and secure chat Please refer to Amion for contact info 49  days

## 2021-09-05 NOTE — Progress Notes (Signed)
PHARMACY - TOTAL PARENTERAL NUTRITION CONSULT NOTE  Indication: Fistula  Patient Measurements: Height: 5\' 2"  (157.5 cm) Weight: (!) 154.3 kg (340 lb 2.7 oz) IBW/kg (Calculated) : 50.1 TPN AdjBW (KG): 75 Body mass index is 62.22 kg/m.  Assessment:  79 YOF with recent R-hemicolectomy on 8/12 in the setting of a cecal mass - consistent with R-colon metastatic adenocarcinoma. 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 Pharmacy consulted to manage TPN for nutritional support in the setting of a colocutaneous fistula.    Glucose / Insulin: No DM, A1C 5.4, CBGs low normal; SSI d/c'ed 08/22/21   Electrolytes 10/11: Na 133 (max in TPN), K 3.8 on furosemide IV daily, CoCa 9.64, others WNL Renal: Scr 0.5, BUN 28 down  Hepatic: LFTs / tbili / TG WNL, albumin 1.7 Intake / Output; MIVF: UOP 1.2 ml/kg/hr, LBM 10/10, fistula 70mL  GI Imaging:  8/24 CT: colocutaneous fistula, abd wall abscess, soft tissue edema, cellulitis vs nec fasciitis 8/25 Abd xray: diffuse abd wall emphysema, nonobstructive bowel gas pattern 9/4 CT: medial upper left thigh hematoma 9/17 CT: no evidence of communication between bowel and subQ tissue. Decrease volume extensive SQ gas along the RIGHT abd wall and upper groin. GI Surgeries / Procedures:  8/12 at [facility]: R-hemicolectomy 8/24: Midline wound opened by Dr. Arnoldo Morale at bedside  Central access: PICC replaced 07/27/21, exchanged 9/26. TPN start date: 07/19/21  Nutritional Goals (RD assessment on 10/7): 2300-2500 kcal, 140-160g AA, fluid >/= 2L per day  Current Nutrition:  TPN 10/7 Soft diet  10/10 calorie count eating 1155 kcal (50% goal), 93g protein (33% goal) per day. Per patient, ate an egg, bites of sausage, peaches, bite of chicken, peas, some potatoes, 1 magic cup and 1 Ensure (cannot remember what she had for lunch) 10/11 Ate 1 Ensure, Lunch: [place] sandwich, chocolate chip cookie, side (can't remember),  orange juice; Dinner: ham & [place] sandwich, french fries, peach cobbler, lemonade   Plan:  Per team ok to stop TPN  TPN Labs discontinued  Ordered PO multivitamin    Benetta Spar, PharmD, BCPS, BCCP Clinical Pharmacist  Please check AMION for all [place] phone numbers After 10:00 PM, call [place] [phone]

## 2021-09-05 NOTE — Progress Notes (Signed)
Occupational Therapy Treatment Patient Details Name: Lauren [name] MRN: [id number] DOB: 24-Sep-1946 Today's Date: 09/05/2021   History of present illness Pt is a 75 y.o. female admitted from [facility] on 2/56/38 with hypotension and Covid (+). Pt with colocutaneous fistula, Afib and new CHf since admission. Pt also with abdominal wall cellulitis and left thigh hematoma requiring blood transfusion. Of note, recent admission (8/9-8/23) to [facility] with partially obstructing tumor in the ascending colon s/p R hemicolectomy 07/06/21. PMH includes A fib, morbid obesity, HTN, lymphedema, hypothyroidism.   OT comments  Patient received in bed and had soiled bed.  Assisted tech with cleaning with assisting patient with rolling in bed. Patient was able to get to eob with mod assist and performed grooming seated on eob. Patient performed 4 sit to stands from eob using rw with min assist to stand initially but progressed to mod assist due to fatigue.  Patient tolerated 1 minute for last stand and 2 minutes other 3 standing. Patient declined to transfer to recliner and returned to supine. Acute OT to continue to follow.     Recommendations for follow up therapy are one component of a multi-disciplinary discharge planning process, led by the attending physician.  Recommendations may be updated based on patient status, additional functional criteria and insurance authorization.    Follow Up Recommendations  SNF    Equipment Recommendations  Wheelchair (measurements OT);Wheelchair cushion (measurements OT)    Recommendations for Other Services      Precautions / Restrictions Precautions Precautions: Fall Precaution Comments: BLE lymphedema, Eakin's pouch for colocutaneous fistula, incontinence Restrictions Weight Bearing Restrictions: No       Mobility Bed Mobility Overal bed mobility: Needs Assistance Bed Mobility: Supine to Sit Rolling: Max assist   Supine to sit: Mod assist;HOB elevated Sit to  supine: Max assist   General bed mobility comments: Patient required assistance with BLEs and assitance with positioning in bed.    Transfers                 General transfer comment: asked not to transfer to recliner    Balance Overall balance assessment: Needs assistance Sitting-balance support: Feet supported;No upper extremity supported Sitting balance-Leahy Scale: Fair Sitting balance - Comments: performed grooming seated on eob   Standing balance support: Bilateral upper extremity supported Standing balance-Leahy Scale: Poor Standing balance comment: stood from eob x4 with min to mod assist to stand and min guard to min assist for balance                           ADL either performed or assessed with clinical judgement   ADL Overall ADL's : Needs assistance/impaired     Grooming: Set up;Sitting Grooming Details (indicate cue type and reason): while seated EOB                               General ADL Comments: Patient performed grooming seated on eob.  Nursing assisted patient with LB bathing     Vision       Perception     Praxis      Cognition Arousal/Alertness: Awake/alert Behavior During Therapy: WFL for tasks assessed/performed Overall Cognitive Status: Impaired/Different from baseline Area of Impairment: Safety/judgement                   Current Attention Level: Selective   Following Commands: Follows one step commands with  increased time;Follows one step commands consistently Safety/Judgement: Decreased awareness of safety;Decreased awareness of deficits Awareness: Intellectual;Emergent Problem Solving: Decreased initiation;Requires verbal cues;Difficulty sequencing General Comments: more eager to participate with OT        Exercises     Shoulder Instructions       General Comments      Pertinent Vitals/ Pain       Pain Assessment: Faces Faces Pain Scale: Hurts little more Pain Location: LLE with  standing Pain Descriptors / Indicators: Discomfort;Grimacing;Guarding Pain Intervention(s): Monitored during session  Home Living                                          Prior Functioning/Environment              Frequency  Min 2X/week        Progress Toward Goals  OT Goals(current goals can now be found in the care plan section)  Progress towards OT goals: Progressing toward goals  Acute Rehab OT Goals Patient Stated Goal: to get better/walk OT Goal Formulation: With patient Time For Goal Achievement: 09/11/21 Potential to Achieve Goals: Fair ADL Goals Pt Will Perform Grooming: with min assist;standing Pt Will Transfer to Toilet: with mod assist;stand pivot transfer;bedside commode Additional ADL Goal #1: Pt will complete bed mobility with 1 person assistance to reduce caregiver burden. Additional ADL Goal #2: Pt will tolerate standing 3-67min for completion of ADL.  Plan Discharge plan remains appropriate    Co-evaluation                 AM-PAC OT "6 Clicks" Daily Activity     Outcome Measure   Help from another person eating meals?: A Little Help from another person taking care of personal grooming?: A Little Help from another person toileting, which includes using toliet, bedpan, or urinal?: A Lot Help from another person bathing (including washing, rinsing, drying)?: A Lot Help from another person to put on and taking off regular upper body clothing?: A Lot Help from another person to put on and taking off regular lower body clothing?: Total 6 Click Score: 13    End of Session Equipment Utilized During Treatment: Rolling walker  OT Visit Diagnosis: Other abnormalities of gait and mobility (R26.89);Muscle weakness (generalized) (M62.81);Other symptoms and signs involving cognitive function;Pain;Adult, failure to thrive (R62.7) Pain - Right/Left: Left Pain - part of body: Hip   Activity Tolerance Patient tolerated treatment well    Patient Left in bed;with call bell/phone within reach;with bed alarm set   Nurse Communication Mobility status        Time: 1340-1416 OT Time Calculation (min): 36 min  Charges: OT General Charges $OT Visit: 1 Visit OT Treatments $Self Care/Home Management : 8-22 mins $Therapeutic Activity: 8-22 mins  Lodema Hong, OTA   Lauren [name] 09/05/2021, 2:37 PM

## 2021-09-06 DIAGNOSIS — A419 Sepsis, unspecified organism: Secondary | ICD-10-CM | POA: Diagnosis not present

## 2021-09-06 DIAGNOSIS — R6521 Severe sepsis with septic shock: Secondary | ICD-10-CM | POA: Diagnosis not present

## 2021-09-06 LAB — RESP PANEL BY RT-PCR (FLU A&B, COVID) ARPGX2
Influenza A by PCR: NEGATIVE
Influenza B by PCR: NEGATIVE
SARS Coronavirus 2 by RT PCR: NEGATIVE

## 2021-09-06 MED ORDER — ADULT MULTIVITAMIN W/MINERALS CH: 1.0000 | ORAL_TABLET | Freq: Every day | ORAL | Status: AC

## 2021-09-06 NOTE — Plan of Care (Signed)
  Problem: Fluid Volume: Goal: Hemodynamic stability will improve Outcome: Progressing   Problem: Clinical Measurements: Goal: Diagnostic test results will improve Outcome: Progressing Goal: Signs and symptoms of infection will decrease Outcome: Progressing   Problem: Respiratory: Goal: Ability to maintain adequate ventilation will improve Outcome: Progressing   Problem: Education: Goal: Knowledge of General Education information will improve Description: Including pain rating scale, medication(s)/side effects and non-pharmacologic comfort measures Outcome: Progressing   Problem: Health Behavior/Discharge Planning: Goal: Ability to manage health-related needs will improve Outcome: Progressing   Problem: Clinical Measurements: Goal: Ability to maintain clinical measurements within normal limits will improve Outcome: Progressing Goal: Will remain free from infection Outcome: Progressing Goal: Diagnostic test results will improve Outcome: Progressing Goal: Respiratory complications will improve Outcome: Progressing Goal: Cardiovascular complication will be avoided Outcome: Progressing   Problem: Nutrition: Goal: Adequate nutrition will be maintained Outcome: Progressing   Problem: Activity: Goal: Risk for activity intolerance will decrease Outcome: Progressing   Problem: Coping: Goal: Level of anxiety will decrease Outcome: Progressing   Problem: Elimination: Goal: Will not experience complications related to bowel motility Outcome: Progressing Goal: Will not experience complications related to urinary retention Outcome: Progressing   Problem: Pain Managment: Goal: General experience of comfort will improve Outcome: Progressing   Problem: Safety: Goal: Ability to remain free from injury will improve Outcome: Progressing   Problem: Skin Integrity: Goal: Risk for impaired skin integrity will decrease Outcome: Progressing   Problem: Activity: Goal: Ability  to tolerate increased activity will improve Outcome: Progressing   Problem: Respiratory: Goal: Patent airway maintenance will improve Outcome: Progressing

## 2021-09-06 NOTE — Progress Notes (Signed)
Physical Therapy Treatment Patient Details Name: Lauren [name] MRN: [id number] DOB: 27-Feb-1946 Today's Date: 09/06/2021   History of Present Illness Pt is a 75 y.o. female admitted from [facility] on 9/47/09 with hypotension and Covid (+). Pt with colocutaneous fistula, Afib and new CHf since admission. Pt also with abdominal wall cellulitis and left thigh hematoma requiring blood transfusion. Of note, recent admission (8/9-8/23) to [facility] with partially obstructing tumor in the ascending colon s/p R hemicolectomy 07/06/21. PMH includes A fib, morbid obesity, HTN, lymphedema, hypothyroidism.    PT Comments    Pt very willing to attempt mobility at this time after per her report waking up more and eating. Pt able to stand and pivot to chair as well as initiate gait with initial stepping forward and back at chair and complete HEP. Pt verbalizing need to start moving as she continues to desire ultimate return home and is aware without movement that is not feasible. Will continue to follow, encouraged OOB to chair daily with nursing as well as chair position for HEP.     Recommendations for follow up therapy are one component of a multi-disciplinary discharge planning process, led by the attending physician.  Recommendations may be updated based on patient status, additional functional criteria and insurance authorization.  Follow Up Recommendations  SNF;Supervision/Assistance - 24 hour     Equipment Recommendations  None recommended by PT    Recommendations for Other Services       Precautions / Restrictions Precautions Precautions: Fall Precaution Comments: BLE lymphedema, Eakin's pouch for colocutaneous fistula, incontinence     Mobility  Bed Mobility Overal bed mobility: Needs Assistance Bed Mobility: Supine to Sit     Supine to sit: Mod assist;HOB elevated     General bed mobility comments: HOB 40 degrees with use of rail, assist with pad to shift hips to right and assist to  bring LLE off of bed with HHA to elevate trunk fully from surface    Transfers Overall transfer level: Needs assistance   Transfers: Sit to/from Stand;Stand Pivot Transfers Sit to Stand: Min guard;+2 safety/equipment Stand pivot transfers: Min guard;+2 safety/equipment       General transfer comment: pt able to stand from bed and from recliner (x3) with cues for hand placement, safety and sequence pivoted from bed to recliner with RW with cues and guarding.  Ambulation/Gait Ambulation/Gait assistance: Min guard;+2 safety/equipment Gait Distance (Feet): 3 Feet Assistive device: Rolling walker (2 wheeled) Gait Pattern/deviations: Shuffle;Trunk flexed   Gait velocity interpretation: <1.8 ft/sec, indicate of risk for recurrent falls General Gait Details: pt able to take 3 steps forward and back at chair x 2 with +2 for safety and chair positioning   Stairs             Wheelchair Mobility    Modified Rankin (Stroke Patients Only)       Balance Overall balance assessment: Needs assistance Sitting-balance support: Feet supported;No upper extremity supported Sitting balance-Leahy Scale: Fair Sitting balance - Comments: EOB without assist   Standing balance support: Bilateral upper extremity supported Standing balance-Leahy Scale: Poor Standing balance comment: reliant on bil UE support on RW in standing                            Cognition Arousal/Alertness: Awake/alert Behavior During Therapy: WFL for tasks assessed/performed Overall Cognitive Status: Impaired/Different from baseline Area of Impairment: Safety/judgement  Safety/Judgement: Decreased awareness of safety;Decreased awareness of deficits   Problem Solving: Slow processing;Requires verbal cues        Exercises General Exercises - Lower Extremity Short Arc Quad: AROM;20 reps;Both;Seated Hip Flexion/Marching: AAROM;Seated;Both;20 reps    General  Comments        Pertinent Vitals/Pain Pain Score: 5  Pain Location: LLE with bed mobility Pain Descriptors / Indicators: Discomfort;Grimacing;Guarding Pain Intervention(s): Limited activity within patient's tolerance;Monitored during session;Repositioned    Home Living                      Prior Function            PT Goals (current goals can now be found in the care plan section) Progress towards PT goals: Progressing toward goals    Frequency    Min 2X/week      PT Plan Current plan remains appropriate    Co-evaluation              AM-PAC PT "6 Clicks" Mobility   Outcome Measure  Help needed turning from your back to your side while in a flat bed without using bedrails?: A Lot Help needed moving from lying on your back to sitting on the side of a flat bed without using bedrails?: A Lot Help needed moving to and from a bed to a chair (including a wheelchair)?: A Lot Help needed standing up from a chair using your arms (e.g., wheelchair or bedside chair)?: A Little Help needed to walk in hospital room?: Total Help needed climbing 3-5 steps with a railing? : Total 6 Click Score: 11    End of Session   Activity Tolerance: Patient tolerated treatment well Patient left: in chair;with call bell/phone within reach;with chair alarm set Nurse Communication: Mobility status;Need for lift equipment PT Visit Diagnosis: Other abnormalities of gait and mobility (R26.89);Muscle weakness (generalized) (M62.81);Difficulty in walking, not elsewhere classified (R26.2);Other (comment)     Time: 3335-4562 PT Time Calculation (min) (ACUTE ONLY): 38 min  Charges:  $Therapeutic Exercise: 8-22 mins $Therapeutic Activity: 23-37 mins                     Jermon Chalfant P, PT Acute Rehabilitation Services Pager: [phone] Office: [place] 09/06/2021, 11:34 AM

## 2021-09-06 NOTE — Progress Notes (Addendum)
[facility] PROGRESS NOTE  MARGA GRAMAJO ZOX:[id number] DOB: 12-17-1945 DOA: 07/18/2021 PCP: Pcp, No  Status: Remains inpatient appropriate because:Unsafe d/c plan and IV treatments appropriate due to intensity of illness or inability to take PO  Dispo: The patient is from: Home              Anticipated d/c is to: [facility]-insurance authorization pending              Patient currently is medically stable to d/c.   Difficult to place patient Yes                Barriers to discharge: Primary facility that had initially agreed to except patient has now declined admission.  Patient is too debilitated to return home alone.  Level of care: Med-Surg   Code Status: DNR Family Communication: No family at bedside DVT prophylaxis: Lovenox COVID vaccination status: Unknown    HPI: 75 year old female patient who recently underwent right hemicolectomy on 07/06/2021 and was found to have metastatic adenoma of the right colon at home she was having abdominal pain, nausea and right-sided back pain.  She presented to [facility] with sepsis physiology that was unresponsive to fluid challenge and was placed on vasopressors.  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  She was incidentally found to be COVID-positive at time of admission.  Due to the abnormal CT findings she was treated with broad-spectrum antibiotics and general surgery was consulted. The patient was transferred to [facility].  She eventually stabilized enough to move out of the ICU and was transferred to hospitalist team on 8/27.  Follow-up CT on 9/17 with overall improvement and no signs of persistent communication of the bowel lumen and the wound in the abdomen.  She remains n.p.o. and on parenteral nutrition.  As documented fistula can take 8 to 9 weeks to close once fistula can become chronic.  Subjective: Sleeping soundly.  Earlier this  morning refused to participate with therapy.  Objective: Vitals:   09/06/21 0500 09/06/21 0554  BP: (!) 110/56   Pulse: 91   Resp: 20   Temp:  97.9 F (36.6 C)  SpO2: 97%     Intake/Output Summary (Last 24 hours) at 09/06/2021 8119 Last data filed at 09/05/2021 2004 Gross per 24 hour  Intake 800 ml  Output 300 ml  Net 500 ml   Filed Weights   08/31/21 0500 09/01/21 0246 09/02/21 0600  Weight: (!) 151.9 kg (!) 151.9 kg (!) 154.3 kg    Exam:  Constitutional: NAD, sleeping soundly Pulmonary: Lung sounds clear to auscultation, no increased work of breathing.  Pulse oximetry 97% Cardiovascular:  S1-S2, pulse irregular,  Normotensive.  Skin pale but warm and dry.  Has significant lymphedema with blistering to legs especially LLE (see pictures below)-bilateral UNNA boots in place Abdomen: Soft and nontender.  Eakin pouch over cutaneous fistula site without change in appearance of drainage but no increase in drainage with advancing diet.  Bowel sounds are present. Neurologic: CN 2- 12 intact, sleeping but at baseline strength 3-4/5 x all 4 extremities.  Sensation intact. Psychiatric: Awakens but remains sleepy which is her early morning baseline.  Pleasant when briefly awakened.  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 New diagnosis of metastatic colon adenocarcinoma. Completed IV Zosyn on 9/6 Tolerating soft diet; TPN discontinued 10/20 Patient intake improved secondary to  identifying food preferences Continue boost breeze supplementation. PMT reconsulted as of 10/13 given patient lack of progress and inability to return home independently.  Patient currently lacks insight into her code of recovering to the point of maintaining 100% self-care especially in the context of metastatic colon cancer.  Given fistula she is currently not a candidate for chemotherapy since this would  only prolong fistulous opening  Physical debility PT documents making slow progress towards goals and needs significant encouragement. Morbid obesity also contributing to difficulty with ambulating and repositioning self in bed Has not been consistently participating with therapies and insurance has declined SNF placement for rehab   Left thigh hematoma, with acute blood loss anemia.  Resolved-s/p 4 units PRBCs Hemoglobin is stable between 7.9 and 8.8  Lymphedema/peripheral edema Has responded well to IV Lasix this admission At discharge will resume preadmission Demadex with potassium supplementation A significant contributing factor to patient's dismobility     09/04/2021   Atrial fibrillation with RVR.  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  LV systolic function 27%, w/diastolic dysfunction 5 years ago echocardiogram was normal with EF of 55 to 60% and no evidence of diastolic dysfunction or other abnormality Given current debility lymphedema remains stable-continue IV Lasix during hospitalization and transition back to [facility] upon discharge   AKI, hyponatremia, hypokalemia, hypomagnesemia, hypochloremia.  Resolved   Obesity class 3/severe protein calorie malnutrition/hypoalbuminemia Body mass index is 62.22 kg/m.  Has hypoalbuminemia with serum albumin less than 1.6 Nutrition Problem: Inadequate oral intake Etiology: altered GI function, acute illness Signs/Symptoms: NPO status Interventions: TPN   Coccyx and thigh pressure ulcer  Nursing has documented resolved as of 0/35   Metabolic encephalopathy with delirium Resolved  Anxiety and depression,  Continue with citalopram and PRN lorazepam.   Hypothyroidism Continue levothyroxine    COVID 19 infection, Was completely asymptomatic without any pneumonia or other significant abnormality Was  positive on 8/24 and has completed quarantine     Data Reviewed: Basic Metabolic Panel: Recent Labs  Lab 09/01/21 0224 09/02/21 0437 09/03/21 0518 09/04/21 0202  NA 131* 130* 135 133*  K 5.0 3.8 4.0 3.8  CL 105 102 102 98  CO2 20* 23 29 28   GLUCOSE 109* 98 83 115*  BUN 31* 33* 35* 28*  CREATININE 0.55 0.55 0.57 0.52  CALCIUM 7.8* 7.5* 7.6* 7.8*  MG  --   --  1.9 2.0  PHOS 3.0  --  4.0 3.6   Liver Function Tests: Recent Labs  Lab 09/03/21 0518  AST 21  ALT 18  ALKPHOS 115  BILITOT 0.5  PROT 5.3*  ALBUMIN 1.7*    CBC: Recent Labs  Lab 09/01/21 0224  WBC 6.3  NEUTROABS 4.4  HGB 8.1*  HCT 27.6*  MCV 91.4  PLT 142*    CBG: Recent Labs  Lab 09/03/21 0340 09/03/21 0750 09/03/21 1152 09/04/21 0824 09/04/21 1603  GLUCAP 92 110* 101* 102* 99      Scheduled Meds:  amiodarone  200 mg Oral Daily   Chlorhexidine Gluconate Cloth  6 each Topical Daily   citalopram  20 mg Oral Daily   feeding supplement  237 mL Oral TID BM   levothyroxine  150 mcg Oral Q0600   loratadine  10 mg Oral Daily   mouth rinse  15 mL Mouth Rinse BID   metoprolol tartrate  12.5 mg Oral BID   mirtazapine  7.5 mg  Oral QHS   multivitamin with minerals  1 tablet Oral Daily   pantoprazole  40 mg Oral BID   rivaroxaban  20 mg Oral Q supper   sodium chloride flush  10-40 mL Intracatheter Q12H   sodium chloride flush  10-40 mL Intracatheter Q12H   Continuous Infusions:    Principal Problem:   Septic shock (HCC) Active Problems:   Hypokalemia   Morbid obesity with BMI of 50.0-59.9, adult (HCC)   Colon carcinoma ([place])   Enterocutaneous fistula   Cellulitis   AKI (acute kidney injury) ([place])   Hyponatremia   Abdominal pain   Consultants: General surgery  Procedures: Double lumen PICC placed on 9/26 to RUE  Antibiotics: Anidulafungin x1 dose on 8/24 Cefepime x1 dose on 8/24 Rocephin x1 dose on 8/24 Meropenem x1 dose on 8/24 Flagyl x1 dose on 8/24 Zosyn 8/24 through  9/6 Remdesivir x3 doses 8/25 through 8/27   Time spent: 25 minutes    Erin Hearing ANP  [facility] 7 am - 330 pm/M-F for direct patient care and secure chat Please refer to Amion for contact info 50  days

## 2021-09-06 NOTE — Progress Notes (Signed)
PT Cancellation Note  Patient Details Name: Lauren [name] MRN: [id number] DOB: 1945-12-14   Cancelled Treatment:    Reason Eval/Treat Not Completed: Patient declined, no reason specified (pt refused stating too fatigued and lack of sleep even after setting time 30 min prior. Pt then states she would refuse later this am and not willing to try moving until the afternoon which this therapist cannot accomodate)   Bruno Leach B Valor [place] 09/06/2021, 8:00 AM [place] Pager: [phone] Office: [phone]

## 2021-09-06 NOTE — TOC Progression Note (Addendum)
Transition of Care [facility]) - Progression Note    Patient Details  Name: VAIDEHI BRADDY MRN: [id number] Date of Birth: 05-17-1946  Transition of Care [facility]) CM/SW [place], RN Phone Number: 09/06/2021, 9:34 AM  Clinical Narrative:    CM met with the patient at the bedside to discuss transitions of care.  The patient states that she would like to find a skilled nursing home for admission so she can build her strength, walk and go home.  Patient states that she was living independently prior to admission to the hospital with no dme at home and no prior home health services.  I called and spoke with the patient's brother on the phone and he is aware that the patient plans to admit to a skilled nursing facility for rehabilitation.  The patient's brother states that he lives in [place] and cares for the patient's 73 year old mother at his home.  CM informed Madilyn Fireman, MSW and she is working on finding available SNF bed for the patient for admission at this time.     Expected Discharge Plan: [place] Barriers to Discharge: Continued Medical Work up  Expected Discharge Plan and Services Expected Discharge Plan: [place] In-house Referral: Clinical Social Work Discharge Planning Services: CM Consult   Living arrangements for the past 2 months: Single Family Home                                       Social Determinants of Health (SDOH) Interventions    Readmission Risk Interventions Readmission Risk Prevention Plan 07/12/2021  Transportation Screening Complete  Home Care Screening Complete  Medication Review (RN CM) Complete  Some recent data might be hidden

## 2021-09-06 NOTE — Progress Notes (Addendum)
1pm: CSW spoke with Juliann Pulse at [facility] who states she can accept the patient tomorrow morning.  CSW notified NP of request for new COVID test.  12pm: CSW received insurance authorization approval from [facility], admit date is 09/07/21.  8:30am: CSW left voicemail with Alyse Low at [facility] requesting a return call.  CSW submitted clinicals to [facility] for insurance authorization.  Madilyn Fireman, MSW, LCSW Transitions of Care  Clinical Social Worker II [phone]

## 2021-09-06 NOTE — TOC Progression Note (Signed)
Transition of Care [facility]) - Progression Note    Patient Details  Name: Lauren [name] MRN: [id number] Date of Birth: Oct 19, 1946  Transition of Care [facility]) CM/SW Contact  Curlene Labrum, RN Phone Number: 09/06/2021, 1:30 PM  Clinical Narrative:    Case management called and updated the patient's brother and notified him that [facility] has approved for patient to be admitted to [facility] facility in [place].  COVID screen will be ordered today and planned transfer to the skilled nursing facility tomorrow.  CM and MSW will continue to follow the patient for SNF placement tomorrow to [facility]   Expected Discharge Plan: [place] Barriers to Discharge: Continued Medical Work up  Expected Discharge Plan and Services Expected Discharge Plan: [place] In-house Referral: Clinical Social Work Discharge Planning Services: CM Consult   Living arrangements for the past 2 months: Single Family Home                                       Social Determinants of Health (SDOH) Interventions    Readmission Risk Interventions Readmission Risk Prevention Plan 07/12/2021  Transportation Screening Complete  Home Care Screening Complete  Medication Review (RN CM) Complete  Some recent data might be hidden

## 2021-09-06 NOTE — Progress Notes (Signed)
Progress Note     Subjective: No acute changes. Pt working to increase PO intake.   Objective: Vital signs in last 24 hours: Temp:  [97.9 F (36.6 C)-98.6 F (37 C)] 97.9 F (36.6 C) (10/13 0554) Pulse Rate:  [75-110] 91 (10/13 0500) Resp:  [20] 20 (10/13 0500) BP: (102-123)/(56-64) 110/56 (10/13 0500) SpO2:  [97 %] 97 % (10/13 0500) Last BM Date: 09/05/21  Intake/Output from previous day: 10/12 0701 - 10/13 0700 In: 800 [P.O.:800] Out: 300 [Urine:300] Intake/Output this shift: No intake/output data recorded.   PE: General appearance: alert and cooperative Resp: normal effort Cardio: regular rate and rhythm GI: soft, no tenderness. Scant fluid in Eakin's this AM   Lab Results:  No results for input(s): WBC, HGB, HCT, PLT in the last 72 hours. BMET Recent Labs    09/04/21 0202  NA 133*  K 3.8  CL 98  CO2 28  GLUCOSE 115*  BUN 28*  CREATININE 0.52  CALCIUM 7.8*   PT/INR No results for input(s): LABPROT, INR in the last 72 hours. CMP     Component Value Date/Time   NA 133 (L) 09/04/2021 0202   K 3.8 09/04/2021 0202   CL 98 09/04/2021 0202   CO2 28 09/04/2021 0202   GLUCOSE 115 (H) 09/04/2021 0202   BUN 28 (H) 09/04/2021 0202   CREATININE 0.52 09/04/2021 0202   CALCIUM 7.8 (L) 09/04/2021 0202   PROT 5.3 (L) 09/03/2021 0518   ALBUMIN 1.7 (L) 09/03/2021 0518   AST 21 09/03/2021 0518   ALT 18 09/03/2021 0518   ALKPHOS 115 09/03/2021 0518   BILITOT 0.5 09/03/2021 0518   GFRNONAA >60 09/04/2021 0202   GFRAA >90 03/28/2014 0435   Lipase     Component Value Date/Time   LIPASE 25 03/27/2014 0600       Studies/Results: No results found.  Anti-infectives: Anti-infectives (From admission, onward)    Start     Dose/Rate Route Frequency Ordered Stop   08/12/21 0415  clindamycin (CLEOCIN) IVPB 600 mg        600 mg 100 mL/hr over 30 Minutes Intravenous  Once 08/12/21 0317 08/12/21 0434   07/20/21 1000  remdesivir 100 mg in sodium chloride 0.9  % 100 mL IVPB       See Hyperspace for full Linked Orders Report.   100 mg 200 mL/hr over 30 Minutes Intravenous Daily 07/19/21 1040 07/21/21 1148   07/20/21 0200  vancomycin (VANCOREADY) IVPB 750 mg/150 mL  Status:  Discontinued        750 mg 150 mL/hr over 60 Minutes Intravenous Every 24 hours 07/19/21 1036 07/19/21 1449   07/19/21 2300  anidulafungin (ERAXIS) 100 mg in sodium chloride 0.9 % 100 mL IVPB  Status:  Discontinued        100 mg 78 mL/hr over 100 Minutes Intravenous Every 24 hours 07/18/21 2107 07/19/21 1449   07/19/21 1600  vancomycin (VANCOREADY) IVPB 750 mg/150 mL  Status:  Discontinued        750 mg 150 mL/hr over 60 Minutes Intravenous Every 24 hours 07/19/21 0919 07/19/21 1016   07/19/21 1600  vancomycin (VANCOREADY) IVPB 750 mg/150 mL  Status:  Discontinued        750 mg 150 mL/hr over 60 Minutes Intravenous Every 24 hours 07/19/21 1016 07/19/21 1036   07/19/21 1400  piperacillin-tazobactam (ZOSYN) IVPB 3.375 g  Status:  Discontinued        3.375 g 12.5 mL/hr over 240 Minutes Intravenous Every 8  hours 07/19/21 1016 07/31/21 1501   07/19/21 1130  remdesivir 200 mg in sodium chloride 0.9% 250 mL IVPB       See Hyperspace for full Linked Orders Report.   200 mg 580 mL/hr over 30 Minutes Intravenous Once 07/19/21 1040 07/19/21 1241   07/19/21 0100  metroNIDAZOLE (FLAGYL) IVPB 500 mg  Status:  Discontinued        500 mg 100 mL/hr over 60 Minutes Intravenous Every 12 hours 07/18/21 1203 07/18/21 1534   07/18/21 2200  ceFEPIme (MAXIPIME) 2 g in sodium chloride 0.9 % 100 mL IVPB  Status:  Discontinued        2 g 200 mL/hr over 30 Minutes Intravenous Every 12 hours 07/18/21 1156 07/18/21 1203   07/18/21 2200  meropenem (MERREM) 2 g in sodium chloride 0.9 % 100 mL IVPB  Status:  Discontinued        2 g 200 mL/hr over 30 Minutes Intravenous Every 12 hours 07/18/21 2104 07/19/21 1011   07/18/21 2200  anidulafungin (ERAXIS) 200 mg in sodium chloride 0.9 % 200 mL IVPB         200 mg 78 mL/hr over 200 Minutes Intravenous  Once 07/18/21 2108 07/19/21 0419   07/18/21 1800  clindamycin (CLEOCIN) IVPB 900 mg  Status:  Discontinued        900 mg 100 mL/hr over 30 Minutes Intravenous Every 8 hours 07/18/21 1534 07/18/21 2103   07/18/21 1600  vancomycin (VANCOREADY) IVPB 2000 mg/400 mL  Status:  Discontinued        2,000 mg 200 mL/hr over 120 Minutes Intravenous Every 24 hours 07/18/21 1544 07/19/21 0919   07/18/21 1545  piperacillin-tazobactam (ZOSYN) IVPB 3.375 g  Status:  Discontinued        3.375 g 12.5 mL/hr over 240 Minutes Intravenous Every 8 hours 07/18/21 1544 07/18/21 2104   07/18/21 1300  cefTRIAXone (ROCEPHIN) 2 g in sodium chloride 0.9 % 100 mL IVPB  Status:  Discontinued        2 g 200 mL/hr over 30 Minutes Intravenous Every 24 hours 07/18/21 1203 07/18/21 1628   07/18/21 1200  ceFEPIme (MAXIPIME) 2 g in sodium chloride 0.9 % 100 mL IVPB  Status:  Discontinued        2 g 200 mL/hr over 30 Minutes Intravenous Every 12 hours 07/18/21 1155 07/18/21 1156   07/18/21 0930  ceFEPIme (MAXIPIME) 2 g in sodium chloride 0.9 % 100 mL IVPB        2 g 200 mL/hr over 30 Minutes Intravenous  Once 07/18/21 0918 07/18/21 1135   07/18/21 0930  metroNIDAZOLE (FLAGYL) IVPB 500 mg  Status:  Discontinued        500 mg 100 mL/hr over 60 Minutes Intravenous  Once 07/18/21 6256 07/18/21 1534        Assessment/Plan 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 Oncology has seen on 9/5 - Midline wound opened by Dr. Arnoldo Morale at bedside 8/24 which had stool decompressing through CC fistula  - Currently has eakin's pouch - scant drainage present  - CT 9/17 w/ overall improvement and no sign of persistent communication between bowel lumen and wound  - Although imaging reassuring, given air and output still in eakin's pouch I am concerned the CC fistula has not completely closed. Made NPO again on  9/20.  - Appreciate palliative assistance with [facility], last seen 9/28 - patient started  on liquids 10/3 and output has remained low, advanced to FLD 10/6 and output remained low, advance to soft diet 10/7 and output continues to be low - TPN discontinued by primary service - patient is stable for discharge to SNF. Defer outpatient management of CC fistula to Dr. Arnoldo Morale - general surgery will sign off at this time, please call if we can be of further assistance    FEN - soft diet VTE - SCDs, Lovenox, per primary  ID - None currently.    COVID + A. Fib on Xarelto at home  ABL anemia - FOBT positive 8/24 Hypothyroidism Severe protein calorie malnutrition - albumin 1.7 from 1.3, encourage PO intake and start to wean TPN  LOS: 50 days    Norm Parcel, [facility] 09/06/2021, 10:28 AM Please see Amion for pager number during day hours 7:00am-4:30pm

## 2021-09-07 DIAGNOSIS — E43 Unspecified severe protein-calorie malnutrition: Secondary | ICD-10-CM

## 2021-09-07 DIAGNOSIS — Z7901 Long term (current) use of anticoagulants: Secondary | ICD-10-CM

## 2021-09-07 DIAGNOSIS — K632 Fistula of intestine: Secondary | ICD-10-CM | POA: Diagnosis not present

## 2021-09-07 DIAGNOSIS — L03311 Cellulitis of abdominal wall: Secondary | ICD-10-CM | POA: Diagnosis not present

## 2021-09-07 DIAGNOSIS — F418 Other specified anxiety disorders: Secondary | ICD-10-CM | POA: Diagnosis not present

## 2021-09-07 DIAGNOSIS — C189 Malignant neoplasm of colon, unspecified: Secondary | ICD-10-CM | POA: Diagnosis not present

## 2021-09-07 DIAGNOSIS — R5381 Other malaise: Secondary | ICD-10-CM | POA: Diagnosis not present

## 2021-09-07 DIAGNOSIS — U071 COVID-19: Secondary | ICD-10-CM | POA: Diagnosis present

## 2021-09-07 DIAGNOSIS — A419 Sepsis, unspecified organism: Secondary | ICD-10-CM | POA: Diagnosis not present

## 2021-09-07 DIAGNOSIS — I890 Lymphedema, not elsewhere classified: Secondary | ICD-10-CM

## 2021-09-07 NOTE — TOC Transition Note (Signed)
Transition of Care [facility]) - CM/SW Discharge Note   Patient Details  Name: Lauren [name] MRN: [id number] Date of Birth: 25-Apr-1946  Transition of Care [facility]) CM/SW Contact:  Curlene Labrum, RN Phone Number: 09/07/2021, 9:55 AM   Clinical Narrative:    Case management met with the patient at the bedside and updated her that she would be transferring to [facility] facility.  Erin Hearing, NP is placing discharge orders and completing discharge summary for the facility.  PICC line discontinuance order placed by Lissa Merlin, NP and bedside nursing is aware to page IV Team to remove the PICC line prior to [facility]'s arrival for transport.  Discharge summary will be placed int he hub for the facility and transport packet will be placed at the secretary's station for transport.  The patient's brother, Legrand Como is aware that the patient will be transferring to [place] arranged for 1100 today by Shon Baton, MSW.   Final next level of care: [place] Barriers to Discharge: Continued Medical Work up   Patient Goals and CMS Choice Patient states their goals for this hospitalization and ongoing recovery are:: Patient wants to go to SNF for short term and go home. CMS Medicare.gov Compare Post Acute Care list provided to:: Patient Choice offered to / list presented to : Patient  Discharge Placement                       Discharge Plan and Services In-house Referral: Clinical Social Work Discharge Planning Services: CM Consult                                 Social Determinants of Health (SDOH) Interventions     Readmission Risk Interventions Readmission Risk Prevention Plan 07/12/2021  Transportation Screening Complete  Home Care Screening Complete  Medication Review (RN CM) Complete  Some recent data might be hidden

## 2021-09-07 NOTE — Progress Notes (Signed)
Report given to Merchandiser, retail at [employer].

## 2021-09-07 NOTE — Discharge Summary (Signed)
Physician Discharge Summary  Lauren [name] PYK:[id number] DOB: Aug 13, 1946 DOA: 07/18/2021  PCP: [name], No  Admit date: 07/18/2021 Discharge date: 09/07/2021  Time spent: 35 minutes  Recommendations for Outpatient Follow-up:  Once discharged from skilled nursing facility patient will need to follow-up with her primary surgeon Dr. [name] Signs Patient will discharge to [facility] for further physical rehabilitation Recommendation is for regular PT and OT to allow for patient to return to her home independently with home health services Continue Eakin's pouch over site of colocutaneous fistula Patient eats better when food preferences are adhered to.  Because of this we have not instituted dietary restrictions. Continue Unna boots to lower extremities after discharge.  They are to be changed 2 times weekly.   Discharge Diagnoses:  Principal Problem:   Septic shock ([place]) Active Problems:   Atrial fibrillation (HCC)   Hypokalemia   Morbid obesity with BMI of 50.0-59.9, adult (HCC)   Colon carcinoma (HCC)   Enterocutaneous fistula   Cellulitis   AKI (acute kidney injury) ([place])   Hyponatremia   Physical deconditioning   Lymphedema (chronic)   Chronic anticoagulation   Severe protein-calorie malnutrition (HCC)   Anxiety with depression   COVID-19 infection (asymptomatic-completed isolation)  SEPSIS RESOLVED  Discharge Condition: Stable  Diet recommendation: Soft regular diet without any restrictions to promote oral intake  Filed Weights   09/01/21 0246 09/02/21 0600 09/07/21 0500  Weight: (!) 151.9 kg (!) 154.3 kg (!) 154.7 kg    History of present illness:  75 year old female patient who recently underwent right hemicolectomy on 07/06/2021 and was found to have metastatic adenoma of the right colon at home she was having abdominal pain, nausea and right-sided back pain.  She presented to [facility] with sepsis physiology that was unresponsive to fluid  challenge and was placed on vasopressors.  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  She was incidentally found to be COVID-positive at time of admission.  Due to the abnormal CT findings she was treated with broad-spectrum antibiotics and general surgery was consulted. The patient was transferred to [facility].  She eventually stabilized enough to move out of the ICU and was transferred to hospitalist team on 8/27.  Follow-up CT on 9/17 with overall improvement and no signs of persistent communication of the bowel lumen and the wound in the abdomen.  Patient was on TPN with bowel rest.  Diet was slowly advanced by the surgical team without any increased in colocutaneous fistula output.  TPN discontinued on 10/11 and patient tolerating soft regular diet.  Hospital Course:  Post operative colocutaneous fistula with cellulitis and abscess, complicated with sepsis (present on admission). New diagnosis of metastatic colon adenocarcinoma. Completed IV Zosyn on 9/6 Tolerating soft diet; TPN discontinued 10/20 Patient intake improved secondary to identifying food preferences Continue boost breeze supplementation. PMT reconsulted as of 10/13 given patient lack of progress and inability to return home independently.  Patient currently lacks insight into her code of recovering to the point of maintaining 100% self-care especially in the context of metastatic colon cancer.  Given fistula she is currently not a candidate for chemotherapy since this would only prolong fistulous opening   Physical debility PT documents making slow progress towards goals and needs significant encouragement. Morbid obesity/chronic bilateral lower extremity lymphedema also contributing to difficulty with ambulating and repositioning self in bed   Left thigh hematoma, with acute blood loss anemia.  Resolved-s/p 4 units  PRBCs Hemoglobin is stable between 7.9 and 8.8    Lymphedema/peripheral edema Has responded well to IV Lasix this admission At discharge will resume preadmission Demadex with potassium supplementation A significant contributing factor to patient's dismobility    09/04/2021     Atrial fibrillation with RVR.  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  LV systolic function 50%, w/diastolic dysfunction 5 years ago echocardiogram was normal with EF of 55 to 60% and no evidence of diastolic dysfunction or other abnormality Given current debility lymphedema remains stable-continue IV Lasix during hospitalization and transition back to [facility] upon discharge   AKI, hyponatremia, hypokalemia, hypomagnesemia, hypochloremia.  Resolved   Obesity class 3/severe protein calorie malnutrition/hypoalbuminemia Body mass index is 62.22 kg/m.  Has hypoalbuminemia with serum albumin less than 1.6 Nutrition Problem: Inadequate oral intake Etiology: altered GI function, acute illness Signs/Symptoms: NPO status Interventions: TPN   Coccyx and thigh pressure ulcer  Nursing has documented resolved as of 9/32   Metabolic encephalopathy with delirium Resolved   Anxiety and depression,  Continue with citalopram and PRN lorazepam.   Hypothyroidism Continue levothyroxine    COVID 19 infection, Was completely asymptomatic without any pneumonia or other significant abnormality Was positive on 8/24 and has completed quarantine  Procedures: Double lumen PICC placed on 9/26 to RUE  Consultations: General surgery  Discharge Exam: Vitals:   09/06/21 2308 09/07/21 0403  BP: (!) 101/55 (!) 104/52  Pulse: 76 (!) 107  Resp:  18  Temp:  97.9 F (36.6 C)  SpO2:  94%   Constitutional: NAD, sleeping soundly Pulmonary: Lung sounds clear to auscultation, no increased work of breathing.  Pulse oximetry 97% Cardiovascular:  S1-S2, pulse  irregular,  Normotensive.  Skin pale but warm and dry.  Has significant lymphedema with blistering to legs especially LLE (see pictures below)-bilateral UNNA boots in place Abdomen: Soft and nontender.  Eakin pouch over cutaneous fistula site without change in appearance of drainage but no increase in drainage with advancing diet.  Bowel sounds are present. Neurologic: CN 2- 12 intact, sleeping but at baseline strength 3-4/5 x all 4 extremities.  Sensation intact. Psychiatric: Awakens but remains sleepy which is her early morning baseline.  Pleasant when briefly awakened.   Discharge Instructions   Discharge Instructions     Diet general   Complete by: As directed    Discharge instructions   Complete by: As directed    Continue regular PT and OT to promote independent mobility   Discharge wound care:   Complete by: As directed    Apply Unna boots and change twice weekly   Increase activity slowly   Complete by: As directed       Allergies as of 09/07/2021       Reactions   Codeine Itching   Compazine [prochlorperazine Edisylate]    Prednisone Nausea And Vomiting   Tetanus Toxoids Other (See Comments)   Knot like "hen egg" came up    Vicodin [hydrocodone-acetaminophen] Itching        Medication List     STOP taking these medications    loperamide 2 MG capsule Commonly known as: IMODIUM   metoprolol succinate 25 MG 24 hr tablet Commonly known as: TOPROL-XL   ondansetron 4 MG disintegrating tablet Commonly known as: ZOFRAN-ODT   oxyCODONE 5 MG immediate release tablet Commonly known as: Oxy IR/ROXICODONE   polyethylene glycol 17 g packet Commonly known as: MIRALAX /  GLYCOLAX       TAKE these medications    acetaminophen 325 MG tablet Commonly known as: TYLENOL Take 2 tablets (650 mg total) by mouth every 6 (six) hours as needed for mild pain. What changed:  medication strength how much to take reasons to take this   amiodarone 200 MG tablet Commonly  known as: PACERONE Take 1 tablet (200 mg total) by mouth daily.   citalopram 20 MG tablet Commonly known as: CELEXA Take 1 tablet (20 mg total) by mouth daily.   diphenhydrAMINE 12.5 MG/5ML elixir Commonly known as: BENADRYL Take 5 mLs (12.5 mg total) by mouth every 6 (six) hours as needed for itching.   feeding supplement Liqd Take 237 mLs by mouth 3 (three) times daily between meals.   ferrous sulfate 325 (65 FE) MG EC tablet Take 1 tablet (325 mg total) by mouth 2 (two) times daily.   ipratropium-albuterol 0.5-2.5 (3) MG/3ML Soln Commonly known as: DUONEB Take 3 mLs by nebulization every 2 (two) hours as needed.   levothyroxine 150 MCG tablet Commonly known as: SYNTHROID Take 1 tablet (150 mcg total) by mouth daily at 6 (six) AM. What changed:  medication strength how much to take when to take this   liver oil-zinc oxide 40 % ointment Commonly known as: DESITIN Apply topically as needed for irritation.   loratadine 10 MG tablet Commonly known as: CLARITIN Take 1 tablet (10 mg total) by mouth daily.   LORazepam 1 MG tablet Commonly known as: ATIVAN Take 1 tablet (1 mg total) by mouth every 6 (six) hours as needed for anxiety.   metoprolol tartrate 25 MG tablet Commonly known as: LOPRESSOR Take 0.5 tablets (12.5 mg total) by mouth 2 (two) times daily.   mirtazapine 7.5 MG tablet Commonly known as: REMERON Take 1 tablet (7.5 mg total) by mouth at bedtime.   multivitamin with minerals Tabs tablet Take 1 tablet by mouth daily.   pantoprazole 40 MG tablet Commonly known as: PROTONIX Take 1 tablet (40 mg total) by mouth 2 (two) times daily. What changed: when to take this   potassium chloride SA 20 MEQ tablet Commonly known as: KLOR-CON TAKE ONE (1) TABLET EACH DAY   rivaroxaban 20 MG Tabs tablet Commonly known as: XARELTO Take 1 tablet (20 mg total) by mouth daily. What changed: when to take this   sodium chloride flush 0.9 % Soln Commonly known as:  NS 10-40 mLs by Intracatheter route every 12 (twelve) hours.   sodium chloride flush 0.9 % Soln Commonly known as: NS 10-40 mLs by Intracatheter route as needed (flush).   torsemide 20 MG tablet Commonly known as: DEMADEX Take 1 tablet (20 mg total) by mouth daily.               Discharge Care Instructions  (From admission, onward)           Start     Ordered   09/07/21 0000  Discharge wound care:       Comments: Apply Unna boots and change twice weekly   09/07/21 0959           Allergies  Allergen Reactions   Codeine Itching   Compazine [Prochlorperazine Edisylate]    Prednisone Nausea And Vomiting   Tetanus Toxoids Other (See Comments)    Knot like "hen egg" came up    Vicodin [Hydrocodone-Acetaminophen] Itching    Follow-up Information     [name] Signs, MD Follow up.   Specialty: General Surgery Contact information:  [address] [place] [address] [phone]                  The results of significant diagnostics from this hospitalization (including imaging, microbiology, ancillary and laboratory) are listed below for reference.    Significant Diagnostic Studies: CT ABDOMEN PELVIS WO CONTRAST  Result Date: 08/11/2021 CLINICAL DATA:  Persistent post procedural fistula EXAM: CT ABDOMEN AND PELVIS WITHOUT CONTRAST TECHNIQUE: Multidetector CT imaging of the abdomen and pelvis was performed following the standard protocol without IV contrast. COMPARISON:  CT 07/18/2021, 9 4,022 FINDINGS: Lower chest: Persistent LEFT pleural effusion with passive atelectasis of the LEFT lower lobe. Small effusion on the RIGHT. Hepatobiliary: Postcholecystectomy.  No abscess. Pancreas: Pancreas is normal. No ductal dilatation. No pancreatic inflammation. Spleen: Normal spleen Adrenals/urinary tract: Adrenal glands and kidneys are normal. The ureters and bladder normal. There is high-density within the ventral aspect of the bladder which is favored CT  streak artifact as seen on lateral projection Stomach/Bowel: Stomach, duodenum small-bowel normal. No evidence of bowel obstruction. Oral contrast transversus to the rectum. No clear communication between the bowel in the cutaneous tissue. Continued reduction in subcutaneous gas along the RIGHT abdominal wall and groin region. No drainable abscess/fluid collections. Vascular/Lymphatic: Abdominal aorta is normal caliber. There is no retroperitoneal or periportal lymphadenopathy. No pelvic lymphadenopathy. Reproductive: Clear uterus Other: Hematoma in the medial;left thigh is decreased on the partially imaged. For example 1 lobe of the hematoma measures 41 mm decreased from 53. The adjacent lobe measures 51 mm compared to 67 mm. Musculoskeletal: No aggressive osseous lesion. IMPRESSION: 1. Continued decrease in volume extensive subcutaneous gas along the RIGHT abdominal wall and upper groin. 2. No evidence of persistent communication between the bowel and the subcutaneous tissue. 3. No evidence of bowel obstruction. Contrast traverses to the rectum. 4. Bilobed hematoma in the medial LEFT thigh is decreased volume. 5. Persistent moderate LEFT pleural effusion with LEFT lower lobe passive atelectasis. Electronically Signed   By: Suzy Bouchard M.D.   On: 08/11/2021 17:57   DG Chest 1 View  Result Date: 08/19/2021 CLINICAL DATA:  PICC placement. EXAM: CHEST  1 VIEW COMPARISON:  08/12/2021 FINDINGS: Right sided PICC tip projects over the right clavicular head, at the expected location of the confluence of the right subclavian vein and superior vena cava. Left lung base opacity is unchanged, obscuring the left hemidiaphragm, consistent with pleural fluid and atelectasis. Pneumonia is possible. Remainder of the lungs is clear. No pneumothorax. IMPRESSION: 1. Right-sided PICC has its tip at the expected location of the confluence of the right subclavian vein and superior vena cava. 2. Persistent left lung base opacity.  Electronically Signed   By: Lajean Manes M.D.   On: 08/19/2021 16:39   DG CHEST PORT 1 VIEW  Result Date: 08/20/2021 CLINICAL DATA:  Chest x-ray dated August 19, 2021 EXAM: PORTABLE CHEST 1 VIEW COMPARISON:  Chest x-ray dated August 19, 2021 FINDINGS: Right arm PICC tip projects over the expected area of the lower SVC. Cardiac and mediastinal contours are unchanged. Small to moderate layering left pleural effusion, unchanged compared to prior. No evidence of pneumothorax. No focal consolidation. IMPRESSION: Right arm PICC tip projects over the expected area of the lower SVC. Electronically Signed   By: Yetta Glassman M.D.   On: 08/20/2021 14:01   DG CHEST PORT 1 VIEW  Result Date: 08/12/2021 CLINICAL DATA:  Shortness of breath EXAM: PORTABLE CHEST 1 VIEW COMPARISON:  07/27/2021 FINDINGS: Right upper extremity PICC with  tip overlying superior vena cava. Unchanged mild cardiomegaly and central congestion. Redemonstrated small left pleural effusion with associated atelectasis. No acute osseous abnormality. IMPRESSION: Cardiomegaly with vascular congestion, small left pleural effusion, and associated atelectasis. Electronically Signed   By: Merilyn Baba M.D.   On: 08/12/2021 02:55    Microbiology: Recent Results (from the past 240 hour(s))  Resp Panel by RT-PCR (Flu A&B, Covid) Nasopharyngeal Swab     Status: None   Collection Time: 09/06/21  5:52 PM   Specimen: Nasopharyngeal Swab; Nasopharyngeal(NP) swabs in vial transport medium  Result Value Ref Range Status   SARS Coronavirus 2 by RT PCR NEGATIVE NEGATIVE Final    Comment: (NOTE) SARS-CoV-2 target nucleic acids are NOT DETECTED.  The SARS-CoV-2 RNA is generally detectable in upper respiratory specimens during the acute phase of infection. The lowest concentration of SARS-CoV-2 viral copies this assay can detect is 138 copies/mL. A negative result does not preclude SARS-Cov-2 infection and should not be used as the sole basis for  treatment or other patient management decisions. A negative result may occur with  improper specimen collection/handling, submission of specimen other than nasopharyngeal swab, presence of viral mutation(s) within the areas targeted by this assay, and inadequate number of viral copies(<138 copies/mL). A negative result must be combined with clinical observations, patient history, and epidemiological information. The expected result is Negative.  Fact Sheet for Patients:  EntrepreneurPulse.com.au  Fact Sheet for Healthcare Providers:  IncredibleEmployment.be  This test is no t yet approved or cleared by the [place] FDA and  has been authorized for detection and/or diagnosis of SARS-CoV-2 by FDA under an Emergency Use Authorization (EUA). This EUA will remain  in effect (meaning this test can be used) for the duration of the COVID-19 declaration under Section 564(b)(1) of the Act, 21 U.S.C.section 360bbb-3(b)(1), unless the authorization is terminated  or revoked sooner.       Influenza A by PCR NEGATIVE NEGATIVE Final   Influenza B by PCR NEGATIVE NEGATIVE Final    Comment: (NOTE) The Xpert Xpress SARS-CoV-2/FLU/RSV plus assay is intended as an aid in the diagnosis of influenza from Nasopharyngeal swab specimens and should not be used as a sole basis for treatment. Nasal washings and aspirates are unacceptable for Xpert Xpress SARS-CoV-2/FLU/RSV testing.  Fact Sheet for Patients: EntrepreneurPulse.com.au  Fact Sheet for Healthcare Providers: IncredibleEmployment.be  This test is not yet approved or cleared by the [place] FDA and has been authorized for detection and/or diagnosis of SARS-CoV-2 by FDA under an Emergency Use Authorization (EUA). This EUA will remain in effect (meaning this test can be used) for the duration of the COVID-19 declaration under Section 564(b)(1) of the Act, 21  U.S.C. section 360bbb-3(b)(1), unless the authorization is terminated or revoked.  Performed at [facility], [address] [place], [place] [address]      Labs: Basic Metabolic Panel: Recent Labs  Lab 09/01/21 0224 09/02/21 0437 09/03/21 0518 09/04/21 0202  NA 131* 130* 135 133*  K 5.0 3.8 4.0 3.8  CL 105 102 102 98  CO2 20* 23 29 28   GLUCOSE 109* 98 83 115*  BUN 31* 33* 35* 28*  CREATININE 0.55 0.55 0.57 0.52  CALCIUM 7.8* 7.5* 7.6* 7.8*  MG  --   --  1.9 2.0  PHOS 3.0  --  4.0 3.6   Liver Function Tests: Recent Labs  Lab 09/03/21 0518  AST 21  ALT 18  ALKPHOS 115  BILITOT 0.5  PROT 5.3*  ALBUMIN 1.7*    CBC: Recent Labs  Lab 09/01/21 0224  WBC 6.3  NEUTROABS 4.4  HGB 8.1*  HCT 27.6*  MCV 91.4  PLT 142*    CBG: Recent Labs  Lab 09/03/21 0340 09/03/21 0750 09/03/21 1152 09/04/21 0824 09/04/21 1603  GLUCAP 92 110* 101* 102* 99       Signed:  Erin Hearing ANP [facility] 09/07/2021, 9:59 AM

## 2021-09-07 NOTE — Progress Notes (Signed)
   Palliative Medicine Inpatient Follow Up Note     Chart Reviewed. Patient assessed at the bedside.   Patient sitting up in bed awake and alert. No acute distress noted. Is excited to know she will be getting out of the hospital soon.   I spent time with her reviewing previous goals of care discussions. Education provided on poor long-term prognosis. She allowed permission to contact her brother, Legrand Como who I have spoken with in the past. Updates provided. Both patient and brother mutually expressed the goals are to continue to treat the treatable allowing Lauren [name] every opportunity to improve. She shares she is not interested in talking about the "what ifs" and would like to try to focus on getting better. She understands her condition is a "challenge" however would like to deal with worst case scenarios when they happen. Legrand Como verbalized his agreement to patient's wishes. He states he only wants the best for his sister with an understanding she cannot return home by herself and unfortunately do to his own health concerns he is unable to provide the care she needs. He also is hopeful for some improvement but acknowledges if her health was to decline or she did not improve he would not want her to suffer.    Discussed the importance of continued conversation with family and their  medical providers regarding overall plan of care and treatment options, ensuring decisions are within the context of the patients values and GOCs.   Questions addressed and support provided.    Objective Assessment: Vital Signs Vitals:   09/07/21 0403 09/07/21 1437  BP: (!) 104/52 103/62  Pulse: (!) 107 87  Resp: 18 18  Temp: 97.9 F (36.6 C) 97.7 F (36.5 C)  SpO2: 94% 97%    Intake/Output Summary (Last 24 hours) at 09/07/2021 1533 Last data filed at 09/07/2021 1357 Gross per 24 hour  Intake --  Output 2101 ml  Net -2101 ml   Last Weight  Most recent update: 09/07/2021  5:19 AM    Weight  154.7  kg (341 lb 0.8 oz)              Gen:  NAD HEENT: moist mucous membranes CV: Regular rate and rhythm, no murmurs rubs or gallops PULM: clear to auscultation bilaterally. No wheezes/rales/rhonchi ABD: soft/nontender/nondistended/normal bowel sounds, Eakin puch in place. EXT: lower extremity lymphedema  Neuro: Alert and oriented x3, mood appropriate   SUMMARY OF RECOMMENDATIONS   Continue with current plan of care per medical team  Patient and brother remains hopeful for some stability/improvement.  Recommendations for outpatient palliative for ongoing support. Patient declines as this time but is aware that services can be initiated at anytime.  PMT will continue to support and follow on as needed basis. Please secure chat for urgent needs.    Time Total: 40 min.   Visit consisted of counseling and education dealing with the complex and emotionally intense issues of symptom management and palliative care in the setting of serious and potentially life-threatening illness.Greater than 50%  of this time was spent counseling and coordinating care related to the above assessment and plan.  Alda Lea, AGPCNP-BC  Palliative Medicine Team [phone]  Palliative Medicine Team providers are available by phone from 7am to 7pm daily and can be reached through the team cell phone. Should this patient require assistance outside of these hours, please call the patient's attending physician.

## 2021-09-07 NOTE — Progress Notes (Signed)
Una boots not on patient.

## 2021-09-07 NOTE — Evaluation (Signed)
Speech Language Pathology Evaluation Patient Details Name: Lauren [name] MRN: [id number] DOB: [birth year] Today's Date: 09/07/2021 Time: 1696-7893 SLP Time Calculation (min) (ACUTE ONLY): 19 min  Problem List:  Patient Active Problem List   Diagnosis Date Noted   Physical deconditioning 09/07/2021   Lymphedema (chronic) 09/07/2021   Chronic anticoagulation 09/07/2021   Severe protein-calorie malnutrition ([place]) 09/07/2021   Anxiety with depression 09/07/2021   COVID-19 infection (asymptomatic-completed isolation) 09/07/2021   Abdominal pain    Septic shock ([place]) 07/18/2021   Sepsis ([place]) 07/18/2021   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   Cellulitis 07/18/2021   AKI (acute kidney injury) ([place]) 07/18/2021   Hyponatremia 07/18/2021   Colon carcinoma ([place])    Morbid obesity with BMI of 50.0-59.9, adult ([place]) 07/04/2021   Chronic combined systolic and diastolic CHF (congestive heart failure) ([place]) 07/04/2021   Constipation    Colonic mass    Iron deficiency anemia 06/29/2021   Elevated troponin 06/29/2021   Hypokalemia 06/29/2021   Rectal pain 06/29/2021   Chronic acquired lymphedema 02/17/2018   Essential hypertension with goal blood pressure less than 130/80 02/17/2018   Anxiety state 03/24/2014   Gallstone pancreatitis 03/23/2014   Acute cholecystitis 12/06/2012   Atrial fibrillation ([place]) 12/06/2012   Pancreatitis, acute 12/04/2012   Hypothyroidism 12/04/2012   Obesity (BMI 35.0-39.9 without comorbidity) 12/04/2012   Past Medical History:  Past Medical History:  Diagnosis Date   Acute cholecystitis 12/06/2012   Chronic combined systolic and diastolic CHF (congestive heart failure) (HCC)    Colon cancer ([place])    Diverticulosis    Hypotension    BP runs soft   Hypothyroidism    Lymphedema    Morbid obesity ([place])    Obesity    Pancreatitis, acute 81/11/7508   Periumbilical hernia    Permanent atrial fibrillation ([place])    Past Surgical History:  Past Surgical  History:  Procedure Laterality Date   BIOPSY  07/05/2021   Procedure: BIOPSY;  Surgeon: [name], DO;  [facility];  Service: Endoscopy;;   CHOLECYSTECTOMY N/A 03/25/2014   Procedure: LAPAROSCOPIC CHOLECYSTECTOMY WITH INTRAOPERATIVE CHOLANGIOGRAM;  Surgeon: Ralene Ok, MD;  Location: [facility];  Service: General;  Laterality: N/A;   COLONOSCOPY WITH PROPOFOL N/A 07/05/2021   Procedure: COLONOSCOPY WITH PROPOFOL;  Surgeon: [name], DO;  [facility];  Service: Endoscopy;  Laterality: N/A;   ESOPHAGOGASTRODUODENOSCOPY (EGD) WITH PROPOFOL N/A 07/05/2021   Procedure: ESOPHAGOGASTRODUODENOSCOPY (EGD) WITH PROPOFOL;  Surgeon: [name], DO;  [facility];  Service: Endoscopy;  Laterality: N/A;   PARTIAL COLECTOMY N/A 07/06/2021   Procedure: RIGHT HEMICOLECTOMY;  Surgeon: [name] Signs, MD;  Location: [facility];  Service: General;  Laterality: N/A;   POLYPECTOMY  07/05/2021   Procedure: POLYPECTOMY;  Surgeon: [name], DO;  [facility];  Service: Endoscopy;;   HPI:  Pt is a 75 y.o. female admitted from [facility] on 2/58/52 with hypotension and Covid (+). Pt with colocutaneous fistula, Afib and new CHF since admission. Pt also with abdominal wall cellulitis and left thigh hematoma requiring blood transfusion. Of note, recent admission (8/9-8/23) to [facility] with partially obstructing tumor in the ascending colon s/p, R hemicolectomy 07/06/21. PMT reconsulted as of 10/13 given patient lack of progress and inability to return home independently. Patient was noted to lack insight into her recovering to the point of maintaining 100% self-care especially in the context of metastatic colon cancer. SLP was consulted to assess cogntion. PMH includes A fib,  morbid obesity, HTN, lymphedema, hypothyroidism.   Assessment / Plan / Recommendation Clinical Impression  Pt participated in speech/language/cognition evaluation. Pt reported that she lives  alone and was independent with medication and financial management prior to admission. She denied any acute changes in cognition and reported that she is ready to "go to rehab and have a whole new life". [facility] Mental Status Examination was completed to evaluate the pt's cognitive-linguistic skills. She achieved a score of 23/30 which is below the normal limits of 27 or more out of 30, and is suggestive of a mild impairment. She exhibited difficulty in the areas of memory, and awareness with reduced insight into the potential impact of her physical and cognitive impairments on function. Pt reported that she believes that she is at baseline and there were not family/friends present to corroborate these findings. Pt verbalized agreement with SLP services at next venue of care. However, regarding her ability to continue medication and medication management upon return home, she stated, "It's just like riding a bike!" and stated that she does not anticipate any difficulty.     SLP Assessment  SLP Recommendation/Assessment: All further Speech Lanaguage Pathology  needs can be addressed in the next venue of care SLP Visit Diagnosis: Cognitive communication deficit (R41.841)    Recommendations for follow up therapy are one component of a multi-disciplinary discharge planning process, led by the attending physician.  Recommendations may be updated based on patient status, additional functional criteria and insurance authorization.    Follow Up Recommendations  Skilled Nursing facility    Frequency and Duration           SLP Evaluation Cognition  Overall Cognitive Status: No family/caregiver present to determine baseline cognitive functioning Arousal/Alertness: Awake/alert Orientation Level: Oriented X4 Year: 2022 Month: October Day of Week: Correct Attention: Focused;Sustained Focused Attention: Appears intact Sustained Attention: Appears intact Memory: Impaired Memory  Impairment: Retrieval deficit;Decreased recall of new information;Storage deficit (Immediate: 5/5; delayed: 3/5; with cues: 1/2) Awareness: Impaired Awareness Impairment: Emergent impairment Problem Solving: Appears intact Executive Function: Sequencing;Organizing Sequencing: Appears intact (Clock drawing 4/4) Organizing: Appears intact (Backward digit span: 2/2)       Comprehension  Auditory Comprehension Overall Auditory Comprehension: Appears within functional limits for tasks assessed Yes/No Questions: Within Functional Limits Commands: Within Functional Limits Conversation: Complex    Expression Expression Primary Mode of Expression: Verbal Verbal Expression Overall Verbal Expression: Appears within functional limits for tasks assessed Initiation: No impairment Level of Generative/Spontaneous Verbalization: Conversation Repetition: No impairment Naming: No impairment Divergent:  (>15 items per minute)   Oral / Motor  Oral Motor/Sensory Function Overall Oral Motor/Sensory Function: Within functional limits Motor Speech Overall Motor Speech: Appears within functional limits for tasks assessed Respiration: Within functional limits Phonation: Normal Resonance: Within functional limits Articulation: Within functional limitis Intelligibility: Intelligible Motor Planning: Witnin functional limits Motor Speech Errors: Not applicable   Trust Leh I. Hardin Negus, [place], [place] Office number [phone] Pager [facility] 09/07/2021, 12:07 PM

## 2021-09-07 NOTE — Progress Notes (Addendum)
CSW notified Juliann Pulse at [employer] of patient's negative COVID result. CSW notified Ebony Hail, NP that patient's PICC can be removed prior to discharge.  The number to call for report is [phone].  CSW called [facility] for pick up at 11am.  RN aware of discharge plan.  Madilyn Fireman, MSW, LCSW Transitions of Care  Clinical Social Worker II [phone]

## 2021-12-08 ENCOUNTER — Observation Stay (HOSPITAL_COMMUNITY)
Admission: EM | Admit: 2021-12-08 | Discharge: 2021-12-10 | Disposition: A | Discharge status: Home / Self Care | Payer: Medicare HMO | Attending: Family Medicine | Admitting: Family Medicine

## 2021-12-08 ENCOUNTER — Emergency Department (HOSPITAL_COMMUNITY): Payer: Medicare HMO

## 2021-12-08 ENCOUNTER — Other Ambulatory Visit: Payer: Self-pay

## 2021-12-08 ENCOUNTER — Encounter (HOSPITAL_COMMUNITY): Payer: Self-pay | Admitting: Emergency Medicine

## 2021-12-08 DIAGNOSIS — E039 Hypothyroidism, unspecified: Secondary | ICD-10-CM | POA: Diagnosis not present

## 2021-12-08 DIAGNOSIS — Z85038 Personal history of other malignant neoplasm of large intestine: Secondary | ICD-10-CM | POA: Diagnosis not present

## 2021-12-08 DIAGNOSIS — R2689 Other abnormalities of gait and mobility: Secondary | ICD-10-CM | POA: Diagnosis not present

## 2021-12-08 DIAGNOSIS — Z8616 Personal history of COVID-19: Secondary | ICD-10-CM | POA: Insufficient documentation

## 2021-12-08 DIAGNOSIS — R410 Disorientation, unspecified: Secondary | ICD-10-CM | POA: Diagnosis not present

## 2021-12-08 DIAGNOSIS — I4891 Unspecified atrial fibrillation: Secondary | ICD-10-CM | POA: Diagnosis not present

## 2021-12-08 DIAGNOSIS — Z20822 Contact with and (suspected) exposure to covid-19: Secondary | ICD-10-CM | POA: Insufficient documentation

## 2021-12-08 DIAGNOSIS — R4182 Altered mental status, unspecified: Secondary | ICD-10-CM | POA: Diagnosis present

## 2021-12-08 DIAGNOSIS — G9340 Encephalopathy, unspecified: Secondary | ICD-10-CM | POA: Diagnosis not present

## 2021-12-08 DIAGNOSIS — I5042 Chronic combined systolic (congestive) and diastolic (congestive) heart failure: Secondary | ICD-10-CM | POA: Insufficient documentation

## 2021-12-08 DIAGNOSIS — I110 Hypertensive heart disease with heart failure: Secondary | ICD-10-CM | POA: Insufficient documentation

## 2021-12-08 DIAGNOSIS — Z79899 Other long term (current) drug therapy: Secondary | ICD-10-CM | POA: Diagnosis not present

## 2021-12-08 DIAGNOSIS — Z7901 Long term (current) use of anticoagulants: Secondary | ICD-10-CM | POA: Diagnosis not present

## 2021-12-08 LAB — COMPREHENSIVE METABOLIC PANEL
ALT: 15 U/L (ref 0–44)
AST: 34 U/L (ref 15–41)
Albumin: 3 g/dL — ABNORMAL LOW (ref 3.5–5.0)
Alkaline Phosphatase: 86 U/L (ref 38–126)
Anion gap: 11 (ref 5–15)
BUN: 13 mg/dL (ref 8–23)
CO2: 24 mmol/L (ref 22–32)
Calcium: 8.9 mg/dL (ref 8.9–10.3)
Chloride: 103 mmol/L (ref 98–111)
Creatinine, Ser: 0.93 mg/dL (ref 0.44–1.00)
GFR, Estimated: >60 mL/min (ref >60)
Glucose, Bld: 119 mg/dL — ABNORMAL HIGH (ref 70–99)
Potassium: 4.7 mmol/L (ref 3.5–5.1)
Sodium: 138 mmol/L (ref 135–145)
Total Bilirubin: 1.3 mg/dL — ABNORMAL HIGH (ref 0.3–1.2)
Total Protein: 6.8 g/dL (ref 6.5–8.1)

## 2021-12-08 LAB — TROPONIN I (HIGH SENSITIVITY): Troponin I (High Sensitivity): 7 ng/L (ref <18)

## 2021-12-08 LAB — CBC
HCT: 37.2 % (ref 36.0–46.0)
Hemoglobin: 11.9 g/dL — ABNORMAL LOW (ref 12.0–15.0)
MCH: 28.3 pg (ref 26.0–34.0)
MCHC: 32 g/dL (ref 30.0–36.0)
MCV: 88.4 fL (ref 80.0–100.0)
Platelets: 236 10*3/uL (ref 150–400)
RBC: 4.21 MIL/uL (ref 3.87–5.11)
RDW: 16.6 % — ABNORMAL HIGH (ref 11.5–15.5)
WBC: 5.2 10*3/uL (ref 4.0–10.5)
nRBC: 0 % (ref 0.0–0.2)

## 2021-12-08 LAB — I-STAT VENOUS BLOOD GAS, ED
Acid-Base Excess: 4 mmol/L — ABNORMAL HIGH (ref 0.0–2.0)
Bicarbonate: 28.3 mmol/L — ABNORMAL HIGH (ref 20.0–28.0)
Calcium, Ion: 1.1 mmol/L — ABNORMAL LOW (ref 1.15–1.40)
HCT: 36 % (ref 36.0–46.0)
Hemoglobin: 12.2 g/dL (ref 12.0–15.0)
O2 Saturation: 99 %
Potassium: 5.4 mmol/L — ABNORMAL HIGH (ref 3.5–5.1)
Sodium: 137 mmol/L (ref 135–145)
TCO2: 30 mmol/L (ref 22–32)
pCO2, Ven: 42.8 mmHg — ABNORMAL LOW (ref 44.0–60.0)
pH, Ven: 7.429 (ref 7.250–7.430)
pO2, Ven: 128 mmHg — ABNORMAL HIGH (ref 32.0–45.0)

## 2021-12-08 LAB — I-STAT CHEM 8, ED
BUN: 17 mg/dL (ref 8–23)
Calcium, Ion: 1.08 mmol/L — ABNORMAL LOW (ref 1.15–1.40)
Chloride: 104 mmol/L (ref 98–111)
Creatinine, Ser: 0.8 mg/dL (ref 0.44–1.00)
Glucose, Bld: 115 mg/dL — ABNORMAL HIGH (ref 70–99)
HCT: 37 % (ref 36.0–46.0)
Hemoglobin: 12.6 g/dL (ref 12.0–15.0)
Potassium: 5.5 mmol/L — ABNORMAL HIGH (ref 3.5–5.1)
Sodium: 137 mmol/L (ref 135–145)
TCO2: 27 mmol/L (ref 22–32)

## 2021-12-08 LAB — APTT: aPTT: 31 seconds (ref 24–36)

## 2021-12-08 LAB — PROTIME-INR
INR: 1.2 (ref 0.8–1.2)
Prothrombin Time: 15.1 seconds (ref 11.4–15.2)

## 2021-12-08 LAB — DIFFERENTIAL
Abs Immature Granulocytes: 0.01 10*3/uL (ref 0.00–0.07)
Basophils Absolute: 0.1 10*3/uL (ref 0.0–0.1)
Basophils Relative: 1 %
Eosinophils Absolute: 0.1 10*3/uL (ref 0.0–0.5)
Eosinophils Relative: 1 %
Immature Granulocytes: 0 %
Lymphocytes Relative: 20 %
Lymphs Abs: 1 10*3/uL (ref 0.7–4.0)
Monocytes Absolute: 0.3 10*3/uL (ref 0.1–1.0)
Monocytes Relative: 6 %
Neutro Abs: 3.7 10*3/uL (ref 1.7–7.7)
Neutrophils Relative %: 72 %

## 2021-12-08 LAB — OSMOLALITY: Osmolality: 291 mOsm/kg (ref 275–295)

## 2021-12-08 LAB — URINALYSIS, ROUTINE W REFLEX MICROSCOPIC
Glucose, UA: NEGATIVE mg/dL
Hgb urine dipstick: NEGATIVE
Ketones, ur: NEGATIVE mg/dL
Leukocytes,Ua: NEGATIVE
Nitrite: NEGATIVE
Protein, ur: NEGATIVE mg/dL
Specific Gravity, Urine: 1.02 (ref 1.005–1.030)
pH: 6 (ref 5.0–8.0)

## 2021-12-08 LAB — BRAIN NATRIURETIC PEPTIDE: B Natriuretic Peptide: 209.3 pg/mL — ABNORMAL HIGH (ref 0.0–100.0)

## 2021-12-08 LAB — TSH: TSH: 5.056 u[IU]/mL — ABNORMAL HIGH (ref 0.350–4.500)

## 2021-12-08 LAB — RESP PANEL BY RT-PCR (FLU A&B, COVID) ARPGX2
Influenza A by PCR: NEGATIVE
Influenza B by PCR: NEGATIVE
SARS Coronavirus 2 by RT PCR: NEGATIVE

## 2021-12-08 LAB — CBG MONITORING, ED: Glucose-Capillary: 104 mg/dL — ABNORMAL HIGH (ref 70–99)

## 2021-12-08 LAB — T4, FREE: Free T4: 1.03 ng/dL (ref 0.61–1.12)

## 2021-12-08 LAB — AMMONIA: Ammonia: 11 umol/L (ref 9–35)

## 2021-12-08 MED ORDER — SODIUM CHLORIDE 0.9 % IV BOLUS
1000.0000 mL | Freq: Once | INTRAVENOUS | Status: AC
  Administered 2021-12-08: 1000 mL via INTRAVENOUS

## 2021-12-08 MED ORDER — LEVOTHYROXINE SODIUM 75 MCG PO TABS
175.0000 ug | ORAL_TABLET | Freq: Every day | ORAL | Status: DC
  Administered 2021-12-09 – 2021-12-10 (×2): 175 ug via ORAL
  Filled 2021-12-08 (×3): qty 1

## 2021-12-08 MED ORDER — IOHEXOL 300 MG/ML  SOLN
80.0000 mL | Freq: Once | INTRAMUSCULAR | Status: AC | PRN
  Administered 2021-12-08: 80 mL via INTRAVENOUS

## 2021-12-08 MED ORDER — APIXABAN 5 MG PO TABS
5.0000 mg | ORAL_TABLET | Freq: Two times a day (BID) | ORAL | Status: DC
  Administered 2021-12-09 – 2021-12-10 (×4): 5 mg via ORAL
  Filled 2021-12-08 (×5): qty 1

## 2021-12-08 MED ORDER — IOHEXOL 350 MG/ML SOLN
75.0000 mL | Freq: Once | INTRAVENOUS | Status: AC | PRN
  Administered 2021-12-08: 75 mL via INTRAVENOUS

## 2021-12-08 MED ORDER — TORSEMIDE 20 MG PO TABS
20.0000 mg | ORAL_TABLET | Freq: Every day | ORAL | Status: DC
  Administered 2021-12-08 – 2021-12-10 (×3): 20 mg via ORAL
  Filled 2021-12-08 (×3): qty 1

## 2021-12-08 MED ORDER — ACETAMINOPHEN 650 MG RE SUPP: 650.0000 mg | Freq: Four times a day (QID) | RECTAL | Status: DC | PRN

## 2021-12-08 MED ORDER — SODIUM CHLORIDE 0.9% FLUSH: 3.0000 mL | Freq: Once | INTRAVENOUS | Status: DC

## 2021-12-08 MED ORDER — ACETAMINOPHEN 325 MG PO TABS: 650.0000 mg | ORAL_TABLET | Freq: Four times a day (QID) | ORAL | Status: DC | PRN

## 2021-12-08 MED ORDER — POTASSIUM CHLORIDE CRYS ER 20 MEQ PO TBCR: 20.0000 meq | EXTENDED_RELEASE_TABLET | Freq: Every morning | ORAL | Status: DC

## 2021-12-08 MED ORDER — METOPROLOL TARTRATE 5 MG/5ML IV SOLN
5.0000 mg | Freq: Once | INTRAVENOUS | Status: AC
  Administered 2021-12-08: 5 mg via INTRAVENOUS
  Filled 2021-12-08: qty 5

## 2021-12-08 NOTE — Progress Notes (Signed)
FPTS Progress Note  S: Delayed note, received page from nurse regarding patient pulling out IV lines and cardiac leads. Spoke with nurse who says that she is unsure her baseline and this behavior seems to be consistent with reports she received from the day nurse. Upon my arrival, patient is conversing with herself. States that her family is present and she wants to talk to them. She screams to me, "what are you doing here" and " I want to go home now." She says "we need to save the babies, they are bruised" and "what kind of dog is that" as she points to the trash can. States that her husband had an affair and continues ongoing conversations regarding this. Upon multiple attempts of redirection, patient says I need to talk to my family and go home but agrees to stay here to talk to her family.  O: BP (!) 120/106 (BP Location: Right Arm)    Pulse 96    Temp 98.2 F (36.8 C) (Oral)    Resp 19    Wt 112.4 kg    LMP 09/13/2016    SpO2 100%    BMI 45.32 kg/m   General: Patient initially agitated, leads removed and in no acute distress. Resp: breathing comfortably without signs of respiratory distress Neuro: answers most questions appropriately, AOx3, nonsensical speech  Psych: mildly agitated but redirectable, presence of visual hallucinations  A/P: -Upon multiple attempts, patient able to be redirected. Delirium precautions in place. Will hold off on further intervention. May consider seroquel and sitter if appropriate. -Patient seems to lack capacity at this time, although alert and oriented to name, place and year, she does not exhibit logical flow of thinking. -Continue to monitor closely, remainder of plan per day team note.   Donney Dice, DO 12/08/2021, 11:51 PM PGY-2, [facility] pager [phone]

## 2021-12-08 NOTE — Code Documentation (Signed)
Stroke Response Nurse Documentation Code Documentation  Lauren [name] is a 76 y.o. female arriving to [facility] ED via [facility] on 12/08/21 with past medical hx of atrial fibrillation, colon cancer, lymphedema. On Xarelto (rivaroxaban) daily. Code stroke was activated by EMS (EMS, ED).   Patient from [facility] where she was LKW at [place] and now complaining of global aphasia .   Stroke team at the bedside on patient arrival. Labs drawn and patient cleared for CT by Dr. Pearline Cables. Patient to CT with team. NIHSS 21, see documentation for details and code stroke times. Patient with right facial droop, bilateral leg weakness, and Global aphasia  on exam. The following imaging was completed:  CT and CTA (CT, CTA head and neck, CTP). Patient is not a candidate for IV Thrombolytic due to taking Xarelto.  Care/Plan: q2h NIH and VS.   Bedside handoff with ED RN Venetia Night.    [place]  Rapid Response RN

## 2021-12-08 NOTE — ED Triage Notes (Signed)
Pt bib ems form [facility]; lsn 0630 today; pt reported to be normally a and o x 4, ambulatory with walker; today lethargic, having slurred speech, R sided facial droop; not following commands, alert to painful stimuli; BP 138/87; cbg 149, HR 141 irreg, hx afib; ion eliquis; staff reports diarrhea yesterday

## 2021-12-08 NOTE — ED Notes (Signed)
Pt just returned from mri alert still

## 2021-12-08 NOTE — ED Notes (Signed)
Patient removed IV, refused to take eliquis, and removed cardiac monitoring. Pt would not allow me to place leads back on. Pt is agitated and is having visual and auditory hallucinations.

## 2021-12-08 NOTE — ED Notes (Signed)
Dr. Woodroe Chen assessing patient at bedside.

## 2021-12-08 NOTE — H&P (Addendum)
[facility] Admission History and Physical Service Pager: [phone]  Patient name: Lauren [name] Medical record number: [id number] Date of birth: 02-18-1946 Age: 76 y.o. Gender: female  Primary Care Provider: Pcp, No Consultants: None Code Status: Need to confirm with family Preferred Emergency Contact: [name] (brother) [phone]  Chief Complaint: Encephalopathy  Assessment and Plan: KALII CHESMORE is a 76 y.o. female presenting with altered mental status concerning for CVA. PMH is significant for A. Fib on DOAC, colon cancer IIIB, mixed sys/dia CHF, lymphedema, hypothyroidism, HTN, Anxiety/Depression  Encephalopathy   CVA r/o Patient comes from nursing home with AMS, lethargy, and speech changes, last known baseline was 0630 12/08/21. In the ED, she was brought in for stroke r/o, vitals were stable with a pulse of 79, BP 143/62, sating 100% on RA. Labs were largely within normal limits, except for a K of 5.5, a TSH of 5.056, a BNP of 209.3, and a VBG with pH of 7.429, pCO2 of 42.8, EKG with a fib. CXR with atelectasis or consolidation in LL lobe with small L pleural effusion. CT without acute abnormality. MRI pending. On exam she was more alert and talkative though she still seemed confused with tangential thought, and no FND on neuro exam. Patient continues to be encephalopathic/confused. Differential for her includes stroke, but less likely given lack of FND, will follow up with MRI brain. Patient encephalopathy could be 2/2 electrolyte abnormalities, or hypoglycemia, but less likely given normal electrolytes and sugars. Patient could be suffering from an infectious as a cause of the encephalopathy, but less likely given normal WBC, and lack of sick symptoms though CXR with LL consolidation. Patient could be suffering from a metabolic encephalopathy 2/2 renal or hepatic dysfunction. But less likely given normal liver enzymes, ammonia level, GFR, BUN, and  Cr. Patient may also have been suffering from delirium, given her encephalopathy and recovery of mental function. Also considered ingestion of substance, will await UDS results.  -Admit to [facility], attending Dr. Erin Hearing -F/u MRI brain -F/u EEG -F/u UDS -Cardiac mon -vitals per floor -Continue to monitor mental status -Find patient baseline mental status -Will contact patient's SNF for more information on baseline status and medication   A. Fib on DOAC Patient in A. Fib on EKG, HR on admission ranging from 106-116. Patient taking Eliquis 5 mg daily BID -Continue Eliquis -Continue to monitor  Colon cancer IIIB s/p right hemicolectomy.  Per chart review, patient had diagnosis of CRC, with surgery in august. Felt like they got all the cancer, according to family. Not receiving any chemo or radiation. No signs of current bleed  Mixed systolic and diastolic CHF  Patient taking amiodarone 200 mg daily, metoprolol tartrate 12.5 mg BID, torosemide 20 daily, per chart review. Cannot confirm meds due to patient mental status.  Lymphedema Patient taking Demadex with K supplementation -Continue Demadex, hold K supplementation  Hypothyroidism  TSH 5.056. Currently taking synthroid 150 mcg daily per chart review  - continue Synthroid which may need to be adjusted.  HTN  BP has ranged from 115-143/62-110, currently 116/98 -Continue to monitor  Anxiety/Depression Patient taking Celexa 20 mg, Remeron 7.5 mg qhs, ativan 1 mg q6h prn -Continue home meds once verified, cannot verify with patient 2/2 mental status   FEN/GI: Heart Healthy diet Prophylaxis: Full dose Eliquis  Disposition: Admit to FPTS  History of Present Illness:  Lauren [name] is a 76 y.o. female presenting with AMS, lethargy, and speech changes. Could not obtain history  from patient 2/2 mental status. Patient with tangential and scattered thoughts.    Review Of Systems:Endorses chills, diarrhea, migraine, and rhinorrhea.    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   Cellulitis 07/18/2021   AKI (acute kidney injury) ([place]) 07/18/2021   Hyponatremia 07/18/2021   Colon carcinoma ([place])    Morbid obesity with BMI of 50.0-59.9, adult ([place]) 07/04/2021   Chronic combined systolic and diastolic CHF (congestive heart failure) ([place]) 07/04/2021   Constipation    Colonic mass    Iron deficiency anemia 06/29/2021   Elevated troponin 06/29/2021   Hypokalemia 06/29/2021   Rectal pain 06/29/2021   Chronic acquired lymphedema 02/17/2018   Essential hypertension with goal blood pressure less than 130/80 02/17/2018   Anxiety state 03/24/2014   Gallstone pancreatitis 03/23/2014   Acute cholecystitis 12/06/2012   Atrial fibrillation ([place]) 12/06/2012   Pancreatitis, acute 12/04/2012   Hypothyroidism 12/04/2012   Obesity (BMI 35.0-39.9 without comorbidity) 12/04/2012    Past Medical History: Past Medical History:  Diagnosis Date   Acute cholecystitis 12/06/2012   Chronic combined systolic and diastolic CHF (congestive heart failure) (H[facility])    Colon cancer (H[facility])    Diverticulosis    Hypotension    BP runs soft   Hypothyroidism    Lymphedema    Morbid obesity ([place])    Obesity    Pancreatitis, acute 85/63/1497   Periumbilical hernia    Permanent atrial fibrillation ([place])     Past Surgical History: Past Surgical History:  Procedure Laterality Date   BIOPSY  07/05/2021   Procedure: BIOPSY;  Surgeon: [name], DO;  [facility];  Service: Endoscopy;;   CHOLECYSTECTOMY N/A 03/25/2014    Procedure: LAPAROSCOPIC CHOLECYSTECTOMY WITH INTRAOPERATIVE CHOLANGIOGRAM;  Surgeon: Ralene Ok, MD;  Location: [facility];  Service: General;  Laterality: N/A;   COLONOSCOPY WITH PROPOFOL N/A 07/05/2021   Procedure: COLONOSCOPY WITH PROPOFOL;  Surgeon: [name], DO;  [facility];  Service: Endoscopy;  Laterality: N/A;   ESOPHAGOGASTRODUODENOSCOPY (EGD) WITH PROPOFOL N/A 07/05/2021   Procedure: ESOPHAGOGASTRODUODENOSCOPY (EGD) WITH PROPOFOL;  Surgeon: [name], DO;  [facility];  Service: Endoscopy;  Laterality: N/A;   PARTIAL COLECTOMY N/A 07/06/2021   Procedure: RIGHT HEMICOLECTOMY;  Surgeon: [name] Signs, MD;  Location: [facility];  Service: General;  Laterality: N/A;   POLYPECTOMY  07/05/2021   Procedure: POLYPECTOMY;  Surgeon: [name], DO;  [facility];  Service: Endoscopy;;    Social History: Social History   Tobacco Use   Smoking status: Never   Smokeless tobacco: Never  Vaping Use   Vaping Use: Never used  Substance Use Topics   Alcohol use: No   Drug use: No    Family History: Family History  Problem Relation Age of Onset   Cancer Father        liver   Hypertension Brother    Heart disease Maternal Grandmother    Kidney disease Maternal Grandfather    Diabetes Maternal Grandfather    Kidney failure Brother     Allergies and Medications: Allergies  Allergen Reactions   Codeine Itching  Compazine [Prochlorperazine Edisylate]    Prednisone Nausea And Vomiting   Tetanus Toxoids Other (See Comments)    Knot like "hen egg" came up    Vicodin [Hydrocodone-Acetaminophen] Itching   No current facility-administered medications on file prior to encounter.   Current Outpatient Medications on File Prior to Encounter  Medication Sig Dispense Refill   acetaminophen (TYLENOL) 325 MG tablet Take 2 tablets (650 mg total) by mouth every 6 (six) hours as needed for mild pain.     amiodarone (PACERONE) 200 MG tablet Take  1 tablet (200 mg total) by mouth daily.     citalopram (CELEXA) 20 MG tablet Take 1 tablet (20 mg total) by mouth daily.     diphenhydrAMINE (BENADRYL) 12.5 MG/5ML elixir Take 5 mLs (12.5 mg total) by mouth every 6 (six) hours as needed for itching. 120 mL 0   feeding supplement (ENSURE ENLIVE / ENSURE PLUS) LIQD Take 237 mLs by mouth 3 (three) times daily between meals. 237 mL 12   ferrous sulfate 325 (65 FE) MG EC tablet Take 1 tablet (325 mg total) by mouth 2 (two) times daily. 60 tablet 3   ipratropium-albuterol (DUONEB) 0.5-2.5 (3) MG/3ML SOLN Take 3 mLs by nebulization every 2 (two) hours as needed. 360 mL    levothyroxine (SYNTHROID) 150 MCG tablet Take 1 tablet (150 mcg total) by mouth daily at 6 (six) AM.     liver oil-zinc oxide (DESITIN) 40 % ointment Apply topically as needed for irritation. 56.7 g 0   loratadine (CLARITIN) 10 MG tablet Take 1 tablet (10 mg total) by mouth daily.     LORazepam (ATIVAN) 1 MG tablet Take 1 tablet (1 mg total) by mouth every 6 (six) hours as needed for anxiety. 30 tablet 0   metoprolol tartrate (LOPRESSOR) 25 MG tablet Take 0.5 tablets (12.5 mg total) by mouth 2 (two) times daily.     mirtazapine (REMERON) 7.5 MG tablet Take 1 tablet (7.5 mg total) by mouth at bedtime.     Multiple Vitamin (MULTIVITAMIN WITH MINERALS) TABS tablet Take 1 tablet by mouth daily.     pantoprazole (PROTONIX) 40 MG tablet Take 1 tablet (40 mg total) by mouth 2 (two) times daily.     potassium chloride SA (KLOR-CON) 20 MEQ tablet TAKE ONE (1) TABLET EACH DAY (Patient not taking: No sig reported) 90 tablet 1   rivaroxaban (XARELTO) 20 MG TABS tablet Take 1 tablet (20 mg total) by mouth daily. 30 tablet    sodium chloride flush (NS) 0.9 % SOLN 10-40 mLs by Intracatheter route every 12 (twelve) hours.     sodium chloride flush (NS) 0.9 % SOLN 10-40 mLs by Intracatheter route as needed (flush).     torsemide (DEMADEX) 20 MG tablet Take 1 tablet (20 mg total) by mouth daily.       Objective: BP (!) 132/110    Pulse (!) 113    Temp 97.8 F (36.6 C)    Resp (!) 22    Wt 112.4 kg    LMP 09/13/2016    SpO2 100%    BMI 45.32 kg/m  Exam: General: Elderly, frail, confused, polite, NAD, white woman Eyes: EOMI, clear conjunctiva ENTM: Dry mucous membrane, tongue midline Neck: Soft Cardiovascular: RRR, NRMG Respiratory: CTABL Gastrointestinal: Soft, NTTP, non-distended MSK: Moving all extremities independently, significant lymphedema in BLE Neuro: Scattered, tangential thought, sensation grossly intact bilaterally, strength 5/5 in UE an LE bilaterally Psych: Good mood and affect  Labs and Imaging: CBC BMET  Recent  Labs  Lab 12/08/21 1125 12/08/21 1133 12/08/21 1218  WBC 5.2  --   --   HGB 11.9*   < > 12.2  HCT 37.2   < > 36.0  PLT 236  --   --    < > = values in this interval not displayed.   Recent Labs  Lab 12/08/21 1125 12/08/21 1133 12/08/21 1218  NA 138 137 137  K 4.7 5.5* 5.4*  CL 103 104  --   CO2 24  --   --   BUN 13 17  --   CREATININE 0.93 0.80  --   GLUCOSE 119* 115*  --   CALCIUM 8.9  --   --      EKG: My own interpretation (not copied from electronic read)  A. fib with rate < 100  MR BRAIN WO CONTRAST  Result Date: 12/08/2021 CLINICAL DATA:  Neuro deficit, acute, stroke suspected EXAM: MRI HEAD WITHOUT CONTRAST TECHNIQUE: Multiplanar, multiecho pulse sequences of the brain and surrounding structures were obtained without intravenous contrast. COMPARISON:  Correlation made with CT imaging earlier same day FINDINGS: Brain: There is no acute infarction or intracranial hemorrhage. There is no intracranial mass, mass effect, or edema. There is no hydrocephalus or extra-axial fluid collection. Ventricles and sulci are within normal limits in size and configuration. Patchy T2 hyperintensity in the supratentorial and pontine white matter is nonspecific but may reflect mild chronic microvascular ischemic changes. Vascular: Major vessel flow  voids at the skull base are preserved. Skull and upper cervical spine: Normal marrow signal is preserved. Sinuses/Orbits: Paranasal sinuses are aerated. Orbits are unremarkable. Other: Sella is unremarkable.  Minimal mastoid fluid opacification. IMPRESSION: No evidence of acute infarction, hemorrhage, or mass. Mild chronic microvascular ischemic changes. Electronically Signed   By: Macy Mis M.D.   On: 12/08/2021 17:30   CT ABDOMEN PELVIS W CONTRAST  Result Date: 12/08/2021 CLINICAL DATA:  Abdominal pain EXAM: CT ABDOMEN AND PELVIS WITH CONTRAST TECHNIQUE: Multidetector CT imaging of the abdomen and pelvis was performed using the standard protocol following bolus administration of intravenous contrast. RADIATION DOSE REDUCTION: This exam was performed according to the departmental dose-optimization program which includes automated exposure control, adjustment of the mA and/or kV according to patient size and/or use of iterative reconstruction technique. CONTRAST:  33mL OMNIPAQUE IOHEXOL 300 MG/ML  SOLN COMPARISON:  10/26/2021 FINDINGS: Lower chest: Heart is enlarged in size. Small left pleural effusion is seen. There are linear densities in the lower lung fields suggesting scarring or minimal subsegmental atelectasis. Hepatobiliary: Surgical clips are seen in gallbladder fossa. There is mild dilation of intrahepatic bile ducts. Distal common bile duct measures 14 mm. Pancreas: No focal abnormality is seen. Spleen: Unremarkable. Adrenals/Urinary Tract: Adrenals are not enlarged. There is no hydronephrosis. There is high density contrast in the collecting systems limiting evaluation for small renal stones. Ureters are not dilated. Urinary bladder is not distended. Stomach/Bowel: Stomach is unremarkable. Small bowel loops are not dilated. Right colon is not seen suggesting right hemicolectomy. Surgical staples are seen in the transverse colon. Scattered diverticula are seen in the colon without evidence of  acute diverticulitis. Vascular/Lymphatic: There are scattered calcifications in the aorta and its major branches. Reproductive: Unremarkable. Other: There is no ascites or pneumoperitoneum. There is small ventral hernia containing fat in the left paramedian location. Musculoskeletal: Levoscoliosis is seen in the lumbar spine. Degenerative changes are noted in the lumbar spine. IMPRESSION: There is no evidence intestinal obstruction or pneumoperitoneum. There is no hydronephrosis.  Diverticulosis of colon without signs of diverticulitis. Status post cholecystectomy. There is dilation of bile ducts which may be related to previous cholecystectomy or stricture at the ampulla. Please correlate with laboratory findings. Small left pleural effusion. Linear densities in the lower lung fields may suggest scarring or subsegmental atelectasis. Electronically Signed   By: Elmer Picker M.D.   On: 12/08/2021 14:06   DG Chest Portable 1 View  Result Date: 12/08/2021 CLINICAL DATA:  76 year old female with history of altered mental status. Lethargy. EXAM: PORTABLE CHEST 1 VIEW COMPARISON:  Chest x-ray 10/26/2021. FINDINGS: Opacities at the left lung base may reflect areas of atelectasis and/or consolidation, with superimposed small left pleural effusion. Right lung is clear. No right pleural effusion. No pneumothorax. No evidence of pulmonary edema. Heart size is normal. Upper mediastinal contours are within normal limits. IMPRESSION: 1. Atelectasis and/or consolidation in the left lower lobe with small left pleural effusion. Electronically Signed   By: Vinnie Langton M.D.   On: 12/08/2021 12:40   CT HEAD CODE STROKE WO CONTRAST  Result Date: 12/08/2021 CLINICAL DATA:  Code stroke. Neuro deficit, acute, stroke suspected; slurred speech EXAM: CT HEAD WITHOUT CONTRAST TECHNIQUE: Contiguous axial images were obtained from the base of the skull through the vertex without intravenous contrast. RADIATION DOSE REDUCTION:  This exam was performed according to the departmental dose-optimization program which includes automated exposure control, adjustment of the mA and/or kV according to patient size and/or use of iterative reconstruction technique. COMPARISON:  None. FINDINGS: Brain: There is no acute intracranial hemorrhage, mass effect, or edema. Gray-white differentiation is preserved. Patchy low-density in the supratentorial white matter is nonspecific but may reflect minor chronic microvascular ischemic changes. Possible superimposed age-indeterminate infarct of the left corona radiata. Ventricles and sulci are within normal limits in size and configuration. Vascular: No hyperdense vessel. Skull: Unremarkable. Sinuses/Orbits: No acute abnormality. Other: Mastoid air cells are clear. ASPECTS [facility] Stroke Program Early CT Score) - Ganglionic level infarction (caudate, lentiform nuclei, internal capsule, insula, M1-M3 cortex): 7 - Supraganglionic infarction (M4-M6 cortex): 3 Total score (0-10 with 10 being normal): 10 IMPRESSION: No acute intracranial hemorrhage. No definite acute infarction. Possible age-indeterminate small vessel infarct of the left corona radiata. Minor chronic microvascular ischemic changes. Initial results were communicated to Dr. Cheral Marker at 11:40 am on 12/08/2021 by text page via the [facility] messaging system. Electronically Signed   By: Macy Mis M.D.   On: 12/08/2021 11:41   CT ANGIO HEAD CODE STROKE  Result Date: 12/08/2021 CLINICAL DATA:  Neuro deficit, acute, stroke suspected; slurred speech EXAM: CT ANGIOGRAPHY HEAD AND NECK TECHNIQUE: Multidetector CT imaging of the head and neck was performed using the standard protocol during bolus administration of intravenous contrast. Multiplanar CT image reconstructions and MIPs were obtained to evaluate the vascular anatomy. Carotid stenosis measurements (when applicable) are obtained utilizing NASCET criteria, using the distal internal carotid diameter  as the denominator. RADIATION DOSE REDUCTION: This exam was performed according to the departmental dose-optimization program which includes automated exposure control, adjustment of the mA and/or kV according to patient size and/or use of iterative reconstruction technique. CONTRAST:  52mL OMNIPAQUE IOHEXOL 350 MG/ML SOLN COMPARISON:  None. FINDINGS: CTA NECK Aortic arch: Great vessel origins are patent. Right carotid system: Patent. No stenosis or evidence of dissection. Left carotid system: Patent. Trace calcified plaque proximal ICA. No stenosis or evidence of dissection. Vertebral arteries: Patent and codominant. No stenosis or evidence of dissection. Skeleton: Cervical spine degenerative changes, greatest from C3-C4 to C5-C6.  Other neck: Unremarkable. Upper chest: No apical lung mass. Review of the MIP images confirms the above findings CTA HEAD Anterior circulation: Intracranial internal carotid arteries are patent with mild calcified plaque. Anterior and middle cerebral arteries are patent. Posterior circulation: Intracranial vertebral arteries are patent. Basilar artery is patent. Major cerebellar artery origins are patent. A right posterior communicating artery is present. Posterior cerebral arteries are patent. Venous sinuses: Patent as allowed by contrast bolus timing. Review of the MIP images confirms the above findings IMPRESSION: No large vessel occlusion, hemodynamically significant stenosis, or evidence of dissection. Electronically Signed   By: Macy Mis M.D.   On: 12/08/2021 12:01   CT ANGIO NECK CODE STROKE  Result Date: 12/08/2021 CLINICAL DATA:  Neuro deficit, acute, stroke suspected; slurred speech EXAM: CT ANGIOGRAPHY HEAD AND NECK TECHNIQUE: Multidetector CT imaging of the head and neck was performed using the standard protocol during bolus administration of intravenous contrast. Multiplanar CT image reconstructions and MIPs were obtained to evaluate the vascular anatomy. Carotid  stenosis measurements (when applicable) are obtained utilizing NASCET criteria, using the distal internal carotid diameter as the denominator. RADIATION DOSE REDUCTION: This exam was performed according to the departmental dose-optimization program which includes automated exposure control, adjustment of the mA and/or kV according to patient size and/or use of iterative reconstruction technique. CONTRAST:  84mL OMNIPAQUE IOHEXOL 350 MG/ML SOLN COMPARISON:  None. FINDINGS: CTA NECK Aortic arch: Great vessel origins are patent. Right carotid system: Patent. No stenosis or evidence of dissection. Left carotid system: Patent. Trace calcified plaque proximal ICA. No stenosis or evidence of dissection. Vertebral arteries: Patent and codominant. No stenosis or evidence of dissection. Skeleton: Cervical spine degenerative changes, greatest from C3-C4 to C5-C6. Other neck: Unremarkable. Upper chest: No apical lung mass. Review of the MIP images confirms the above findings CTA HEAD Anterior circulation: Intracranial internal carotid arteries are patent with mild calcified plaque. Anterior and middle cerebral arteries are patent. Posterior circulation: Intracranial vertebral arteries are patent. Basilar artery is patent. Major cerebellar artery origins are patent. A right posterior communicating artery is present. Posterior cerebral arteries are patent. Venous sinuses: Patent as allowed by contrast bolus timing. Review of the MIP images confirms the above findings IMPRESSION: No large vessel occlusion, hemodynamically significant stenosis, or evidence of dissection. Electronically Signed   By: Macy Mis M.D.   On: 12/08/2021 12:01     Holley Bouche, MD 12/08/2021, 3:52 PM PGY-1, [place] [facility] pager: [phone], text pages welcome   FPTS Upper-Level Resident Addendum   I have independently interviewed and examined the patient. I have discussed the above with the original author and agree  with their documentation. My edits for correction/addition/clarification are included. Please see also any attending notes.   Shary Key, D.O. PGY-2, [place] [facility] 12/08/2021 9:06 PM  [place] Service pager: [phone] (text pages welcome through [place])

## 2021-12-08 NOTE — ED Notes (Signed)
To mri 

## 2021-12-08 NOTE — ED Notes (Signed)
Family medicine teaching service paged.

## 2021-12-08 NOTE — ED Notes (Signed)
Unable to do the nih  pt returned from mri and the admission team was at  the bedside  delayed at presrent

## 2021-12-08 NOTE — ED Notes (Signed)
Pt transported to CT ?

## 2021-12-08 NOTE — ED Notes (Signed)
The pt passed the swallow scrfeen  she is alert and oriented now.  Upset because she does not remember the early part of the day

## 2021-12-08 NOTE — ED Provider Notes (Addendum)
[facility] EMERGENCY DEPARTMENT Provider Note   CSN: [id number] Arrival date & time: 12/08/21  1118     History  Chief Complaint  Patient presents with   Code Stroke    Lauren [name] is a 76 y.o. female.  This is a 76 y.o. female with significant medical history as below, including atrial fibrillation on DOAC, colon cancer, lymphadema, nursing home resident who presents to the ED with complaint of altered mental status, lethargic, speech changes. Per nursing patient typically is somewhat ambulatory and interactive at baseline, since around 6:30 AM this morning patient with change to her mentation.  Nursing home also reported some episodes of diarrhea in the past 24 hours.  Patient unable to contribute significantly history secondary to altered mental status.  Patient initially arrived as a stroke rule out.    Level 5 caveat, altered mental status  The history is provided by the EMS personnel and the nursing home. No language interpreter was used.  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   Cellulitis 07/18/2021   AKI (acute kidney injury) ([place]) 07/18/2021   Hyponatremia 07/18/2021   Colon carcinoma ([place])    Morbid obesity with BMI of 50.0-59.9, adult ([place]) 07/04/2021   Chronic combined systolic and diastolic CHF (congestive heart failure) ([place]) 07/04/2021   Constipation    Colonic mass    Iron deficiency anemia 06/29/2021   Elevated troponin 06/29/2021   Hypokalemia 06/29/2021   Rectal pain 06/29/2021   Chronic acquired lymphedema 02/17/2018   Essential hypertension  with goal blood pressure less than 130/80 02/17/2018   Anxiety state 03/24/2014   Gallstone pancreatitis 03/23/2014   Acute cholecystitis 12/06/2012   Atrial fibrillation ([place]) 12/06/2012   Pancreatitis, acute 12/04/2012   Hypothyroidism 12/04/2012   Obesity (BMI 35.0-39.9 without comorbidity) 12/04/2012       Home Medications Prior to Admission medications   Medication Sig Start Date End Date Taking? Authorizing Provider  acetaminophen (TYLENOL) 325 MG tablet Take 2 tablets (650 mg total) by mouth every 6 (six) hours as needed for mild pain. 09/03/21   Samella Parr, NP  amiodarone (PACERONE) 200 MG tablet Take 1 tablet (200 mg total) by mouth daily. 09/03/21   Samella Parr, NP  citalopram (CELEXA) 20 MG tablet Take 1 tablet (20 mg total) by mouth daily. 09/03/21   Samella Parr, NP  diphenhydrAMINE (BENADRYL) 12.5 MG/5ML elixir Take 5 mLs (12.5 mg total) by mouth every 6 (six) hours as needed for itching. 09/03/21   Samella Parr, NP  feeding supplement (ENSURE ENLIVE / ENSURE PLUS) LIQD Take 237 mLs by mouth 3 (three) times daily between meals. 09/03/21   Samella Parr, NP  ferrous sulfate 325 (65 FE) MG EC tablet Take 1 tablet (325 mg total) by mouth 2 (two) times daily. 07/17/21 07/17/22  Kathie Dike, MD  ipratropium-albuterol (DUONEB) 0.5-2.5 (3) MG/3ML SOLN Take 3 mLs by nebulization every 2 (two) hours as needed. 09/03/21   Samella Parr, NP  levothyroxine (SYNTHROID) 150 MCG tablet Take 1 tablet (150 mcg total) by mouth daily at 6 (six) AM. 09/04/21   Samella Parr, NP  liver  oil-zinc oxide (DESITIN) 40 % ointment Apply topically as needed for irritation. 09/03/21   Samella Parr, NP  loratadine (CLARITIN) 10 MG tablet Take 1 tablet (10 mg total) by mouth daily. 09/03/21   Samella Parr, NP  LORazepam (ATIVAN) 1 MG tablet Take 1 tablet (1 mg total) by mouth every 6 (six) hours as needed for anxiety. 09/03/21   Samella Parr, NP  metoprolol tartrate  (LOPRESSOR) 25 MG tablet Take 0.5 tablets (12.5 mg total) by mouth 2 (two) times daily. 09/03/21   Samella Parr, NP  mirtazapine (REMERON) 7.5 MG tablet Take 1 tablet (7.5 mg total) by mouth at bedtime. 09/03/21   Samella Parr, NP  Multiple Vitamin (MULTIVITAMIN WITH MINERALS) TABS tablet Take 1 tablet by mouth daily. 09/07/21   Samella Parr, NP  pantoprazole (PROTONIX) 40 MG tablet Take 1 tablet (40 mg total) by mouth 2 (two) times daily. 09/03/21   Samella Parr, NP  potassium chloride SA (KLOR-CON) 20 MEQ tablet TAKE ONE (1) TABLET EACH DAY Patient not taking: No sig reported 09/20/20   Arnoldo Lenis, MD  rivaroxaban (XARELTO) 20 MG TABS tablet Take 1 tablet (20 mg total) by mouth daily. 09/03/21   Samella Parr, NP  sodium chloride flush (NS) 0.9 % SOLN 10-40 mLs by Intracatheter route every 12 (twelve) hours. 09/03/21   Samella Parr, NP  sodium chloride flush (NS) 0.9 % SOLN 10-40 mLs by Intracatheter route as needed (flush). 09/03/21   Samella Parr, NP  torsemide (DEMADEX) 20 MG tablet Take 1 tablet (20 mg total) by mouth daily. 07/17/21   Kathie Dike, MD      Allergies    Codeine, Compazine [prochlorperazine edisylate], Prednisone, Tetanus toxoids, and Vicodin [hydrocodone-acetaminophen]    Review of Systems   Review of Systems  Unable to perform ROS: Mental status change   Physical Exam Updated Vital Signs BP (!) 143/62 Comment: Simultaneous filing. User may not have seen previous data.   Pulse 79 Comment: Simultaneous filing. User may not have seen previous data.   Temp 97.6 F (36.4 C) (Rectal)    Resp 17 Comment: Simultaneous filing. User may not have seen previous data.   Wt 112.4 kg    LMP 09/13/2016    SpO2 100%    BMI 45.32 kg/m  Physical Exam Vitals and nursing note reviewed. Exam conducted with a chaperone present.  Constitutional:      General: She is not in acute distress.    Appearance: She is obese. She is not ill-appearing.  HENT:      Head: Normocephalic and atraumatic.     Right Ear: External ear normal.     Left Ear: External ear normal.     Nose: Nose normal.     Mouth/Throat:     Mouth: Mucous membranes are moist.  Eyes:     General: No scleral icterus.       Right eye: No discharge.        Left eye: No discharge.  Cardiovascular:     Rate and Rhythm: Tachycardia present. Rhythm irregular.     Pulses: Normal pulses.     Heart sounds: Normal heart sounds.  Pulmonary:     Effort: Pulmonary effort is normal. No respiratory distress.     Breath sounds: Normal breath sounds. No wheezing or rales.  Abdominal:     General: Abdomen is flat. There is no distension.     Palpations: Abdomen is soft.  Tenderness: There is no abdominal tenderness.     Comments: Well healed surgical scar to midline  Musculoskeletal:     Cervical back: Normal range of motion.     Comments: B/l LE lymphadema   Skin:    General: Skin is warm and dry.     Capillary Refill: Capillary refill takes less than 2 seconds.     Coloration: Skin is not jaundiced.     Findings: No bruising.     Comments: No obvious sacral decubitus ulceration  No cellulitis to LE   Neurological:     Mental Status: She is alert.     GCS: GCS eye subscore is 2. GCS verbal subscore is 2. GCS motor subscore is 4.     Comments: 2-3 beats of clonus to LE  Pt groans in response to painful stimulus   Withdraws to pain    ED Results / Procedures / Treatments   Labs (all labs ordered are listed, but only abnormal results are displayed) Labs Reviewed  CBC - Abnormal; Notable for the following components:      Result Value   Hemoglobin 11.9 (*)    RDW 16.6 (*)    All other components within normal limits  COMPREHENSIVE METABOLIC PANEL - Abnormal; Notable for the following components:   Glucose, Bld 119 (*)    Albumin 3.0 (*)    Total Bilirubin 1.3 (*)    All other components within normal limits  I-STAT CHEM 8, ED - Abnormal; Notable for the following  components:   Potassium 5.5 (*)    Glucose, Bld 115 (*)    Calcium, Ion 1.08 (*)    All other components within normal limits  CBG MONITORING, ED - Abnormal; Notable for the following components:   Glucose-Capillary 104 (*)    All other components within normal limits  I-STAT VENOUS BLOOD GAS, ED - Abnormal; Notable for the following components:   pCO2, Ven 42.8 (*)    pO2, Ven 128.0 (*)    Bicarbonate 28.3 (*)    Acid-Base Excess 4.0 (*)    Potassium 5.4 (*)    Calcium, Ion 1.10 (*)    All other components within normal limits  RESP PANEL BY RT-PCR (FLU A&B, COVID) ARPGX2  PROTIME-INR  APTT  DIFFERENTIAL  TSH  T4, FREE  OSMOLALITY  BLOOD GAS, VENOUS  URINALYSIS, ROUTINE W REFLEX MICROSCOPIC  RAPID URINE DRUG SCREEN, HOSP PERFORMED  BRAIN NATRIURETIC PEPTIDE  AMMONIA  TROPONIN I (HIGH SENSITIVITY)    EKG EKG Interpretation  Date/Time:  Saturday December 08 2021 11:55:49 EST Ventricular Rate:  93 PR Interval:    QRS Duration: 105 QT Interval:  387 QTC Calculation: 482 R Axis:   5 Text Interpretation: Atrial fibrillation similar to prior Confirmed by Wynona Dove ([id number]) on 12/08/2021 12:29:51 PM  Radiology CT HEAD CODE STROKE WO CONTRAST  Result Date: 12/08/2021 CLINICAL DATA:  Code stroke. Neuro deficit, acute, stroke suspected; slurred speech EXAM: CT HEAD WITHOUT CONTRAST TECHNIQUE: Contiguous axial images were obtained from the base of the skull through the vertex without intravenous contrast. RADIATION DOSE REDUCTION: This exam was performed according to the departmental dose-optimization program which includes automated exposure control, adjustment of the mA and/or kV according to patient size and/or use of iterative reconstruction technique. COMPARISON:  None. FINDINGS: Brain: There is no acute intracranial hemorrhage, mass effect, or edema. Kelli Robeck-white differentiation is preserved. Patchy low-density in the supratentorial white matter is nonspecific but may reflect  minor chronic microvascular ischemic changes. Possible  superimposed age-indeterminate infarct of the left corona radiata. Ventricles and sulci are within normal limits in size and configuration. Vascular: No hyperdense vessel. Skull: Unremarkable. Sinuses/Orbits: No acute abnormality. Other: Mastoid air cells are clear. ASPECTS [facility] Stroke Program Early CT Score) - Ganglionic level infarction (caudate, lentiform nuclei, internal capsule, insula, M1-M3 cortex): 7 - Supraganglionic infarction (M4-M6 cortex): 3 Total score (0-10 with 10 being normal): 10 IMPRESSION: No acute intracranial hemorrhage. No definite acute infarction. Possible age-indeterminate small vessel infarct of the left corona radiata. Minor chronic microvascular ischemic changes. Initial results were communicated to Dr. Cheral Marker at 11:40 am on 12/08/2021 by text page via the [facility] messaging system. Electronically Signed   By: Macy Mis M.D.   On: 12/08/2021 11:41   CT ANGIO HEAD CODE STROKE  Result Date: 12/08/2021 CLINICAL DATA:  Neuro deficit, acute, stroke suspected; slurred speech EXAM: CT ANGIOGRAPHY HEAD AND NECK TECHNIQUE: Multidetector CT imaging of the head and neck was performed using the standard protocol during bolus administration of intravenous contrast. Multiplanar CT image reconstructions and MIPs were obtained to evaluate the vascular anatomy. Carotid stenosis measurements (when applicable) are obtained utilizing NASCET criteria, using the distal internal carotid diameter as the denominator. RADIATION DOSE REDUCTION: This exam was performed according to the departmental dose-optimization program which includes automated exposure control, adjustment of the mA and/or kV according to patient size and/or use of iterative reconstruction technique. CONTRAST:  73mL OMNIPAQUE IOHEXOL 350 MG/ML SOLN COMPARISON:  None. FINDINGS: CTA NECK Aortic arch: Great vessel origins are patent. Right carotid system: Patent. No stenosis or  evidence of dissection. Left carotid system: Patent. Trace calcified plaque proximal ICA. No stenosis or evidence of dissection. Vertebral arteries: Patent and codominant. No stenosis or evidence of dissection. Skeleton: Cervical spine degenerative changes, greatest from C3-C4 to C5-C6. Other neck: Unremarkable. Upper chest: No apical lung mass. Review of the MIP images confirms the above findings CTA HEAD Anterior circulation: Intracranial internal carotid arteries are patent with mild calcified plaque. Anterior and middle cerebral arteries are patent. Posterior circulation: Intracranial vertebral arteries are patent. Basilar artery is patent. Major cerebellar artery origins are patent. A right posterior communicating artery is present. Posterior cerebral arteries are patent. Venous sinuses: Patent as allowed by contrast bolus timing. Review of the MIP images confirms the above findings IMPRESSION: No large vessel occlusion, hemodynamically significant stenosis, or evidence of dissection. Electronically Signed   By: Macy Mis M.D.   On: 12/08/2021 12:01   CT ANGIO NECK CODE STROKE  Result Date: 12/08/2021 CLINICAL DATA:  Neuro deficit, acute, stroke suspected; slurred speech EXAM: CT ANGIOGRAPHY HEAD AND NECK TECHNIQUE: Multidetector CT imaging of the head and neck was performed using the standard protocol during bolus administration of intravenous contrast. Multiplanar CT image reconstructions and MIPs were obtained to evaluate the vascular anatomy. Carotid stenosis measurements (when applicable) are obtained utilizing NASCET criteria, using the distal internal carotid diameter as the denominator. RADIATION DOSE REDUCTION: This exam was performed according to the departmental dose-optimization program which includes automated exposure control, adjustment of the mA and/or kV according to patient size and/or use of iterative reconstruction technique. CONTRAST:  61mL OMNIPAQUE IOHEXOL 350 MG/ML SOLN  COMPARISON:  None. FINDINGS: CTA NECK Aortic arch: Great vessel origins are patent. Right carotid system: Patent. No stenosis or evidence of dissection. Left carotid system: Patent. Trace calcified plaque proximal ICA. No stenosis or evidence of dissection. Vertebral arteries: Patent and codominant. No stenosis or evidence of dissection. Skeleton: Cervical spine degenerative changes, greatest from  C3-C4 to C5-C6. Other neck: Unremarkable. Upper chest: No apical lung mass. Review of the MIP images confirms the above findings CTA HEAD Anterior circulation: Intracranial internal carotid arteries are patent with mild calcified plaque. Anterior and middle cerebral arteries are patent. Posterior circulation: Intracranial vertebral arteries are patent. Basilar artery is patent. Major cerebellar artery origins are patent. A right posterior communicating artery is present. Posterior cerebral arteries are patent. Venous sinuses: Patent as allowed by contrast bolus timing. Review of the MIP images confirms the above findings IMPRESSION: No large vessel occlusion, hemodynamically significant stenosis, or evidence of dissection. Electronically Signed   By: Macy Mis M.D.   On: 12/08/2021 12:01    Procedures Procedures    Medications Ordered in ED Medications  sodium chloride flush (NS) 0.9 % injection 3 mL (has no administration in time range)  iohexol (OMNIPAQUE) 350 MG/ML injection 75 mL (75 mLs Intravenous Contrast Given 12/08/21 1145)    ED Course/ Medical Decision Making/ A&P                           Medical Decision Making   CC: AMS  This patient to ED for evaluation of AMS; this involves an extensive number of treatment options and is a complaint that carries with it a high risk of complications and morbidity. Vital signs were reviewed. Serious etiologies considered.  Record review:  Previous records obtained and reviewed   Additional history obtained from EMS  Work up as above, notable  for:  Labs & imaging results that were available during my care of the patient were reviewed by me and considered in my medical decision making.   I ordered imaging studies which included CT stroke protocol with head/neck angio and I independently visualized and interpreted imaging which showed possible left corona radiate infarct (age indeterminate); CTA stable.  MRI per neuro team to eval for CVA.   EKG with atrial fibrillation, cardiac monitoring reviewed by myself shows atrial fibrillation with RVR  Management: Give BB for rate control, afib. She is already on DOAC  Reassessment:  Patient with encephalopathy, likely metabolic.  Etiology of her encephalopathy is unclear at this time.  Laboratory evaluation at this time appears stable.  She is hemodynamically stable.  Patient remains altered.  Unable to contribute to history.  Airway intact.  Marland Kitchen Upon reassessment patient is more talkative, conversant.  She is unsure why she was not speaking earlier.  She reports her "memory is fuzzy"  From earlier in the day.  She thinks she may have "overdone it at physical therapy."  She does still appear confused.  Remains poor historian.  Continue with plan for admission for encephalopathy/delirium    At this time recommend admission for encephalopathy, CVA. MRI brain pending via neuro service which has evaluated the pt.   D/w medicine team who accepts pt for admission.          This chart was dictated using voice recognition software.  Despite best efforts to proofread,  errors can occur which can change the documentation meaning.         Final Clinical Impression(s) / ED Diagnoses Final diagnoses:  Delirium  Atrial fibrillation with RVR [facility])  Encephalopathy    Rx / DC Orders ED Discharge Orders     None         Jeanell Sparrow, DO 12/08/21 2027    Jeanell Sparrow, DO 12/08/21 2028

## 2021-12-08 NOTE — ED Notes (Signed)
Pt upset crying because she does not know how she got here she knows she is in the hospital  c/o rt  ankle pain.  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

## 2021-12-08 NOTE — ED Notes (Signed)
Brother Dorene Grebe [phone] (approved to talk get any and all info from patient) would like an update

## 2021-12-08 NOTE — ED Notes (Signed)
The pt reports that she is sore all over her body  she had physical therapy yesterday and all her muscles are sore.  Her mother who is 56 lives in [place] with her brother  according to the pt she never sees them  they never visit her

## 2021-12-08 NOTE — Consult Note (Signed)
Neurology Consultation  Reason for Consult: Code Stroke  Referring Physician: Dr. Pearline Cables  CC: Patient does not provide a chief complaint as she is nonverbal on arrival   History is obtained from: Chart review, unable to obtain from patient due to patient's mental status   HPI: Lauren [name] is a 76 y.o. 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 Patient is a resident at a SNF and reportedly is ambulatory with a walker and typically alert and oriented throughout. She was last reported seen normal at 06:30 this morning by facility staff before being found to have slurred speech, right facial droop, and lethargy.  LKW: 06:30 TNK given?: no, patient arrived outside of the thrombolytic therapy window IR Thrombectomy? No, imaging reviewed without evidence of LVO  Modified Rankin Scale: 3-Moderate disability-requires help but walks WITHOUT assistance  ROS: Unable to obtain due to altered mental status.   Past Medical History:  Diagnosis Date   Acute cholecystitis 12/06/2012   Chronic combined systolic and diastolic CHF (congestive heart failure) (HCC)    Colon cancer (HCC)    Diverticulosis    Hypotension    BP runs soft   Hypothyroidism    Lymphedema    Morbid obesity ([place])    Obesity    Pancreatitis, acute 85/27/7824   Periumbilical hernia    Permanent atrial fibrillation [facility])    Past Surgical History:  Procedure Laterality Date   BIOPSY  07/05/2021   Procedure: BIOPSY;  Surgeon: [name], DO;  [facility];  Service: Endoscopy;;    CHOLECYSTECTOMY N/A 03/25/2014   Procedure: LAPAROSCOPIC CHOLECYSTECTOMY WITH INTRAOPERATIVE CHOLANGIOGRAM;  Surgeon: Ralene Ok, MD;  Location: [place];  Service: General;  Laterality: N/A;   COLONOSCOPY WITH PROPOFOL N/A 07/05/2021   Procedure: COLONOSCOPY WITH PROPOFOL;  Surgeon: [name], DO;  [facility];  Service: Endoscopy;  Laterality: N/A;   ESOPHAGOGASTRODUODENOSCOPY (EGD) WITH PROPOFOL N/A 07/05/2021   Procedure: ESOPHAGOGASTRODUODENOSCOPY (EGD) WITH PROPOFOL;  Surgeon: [name], DO;  [facility];  Service: Endoscopy;  Laterality: N/A;   PARTIAL COLECTOMY N/A 07/06/2021   Procedure: RIGHT HEMICOLECTOMY;  Surgeon: [name] Signs, MD;  Location: [facility];  Service: General;  Laterality: N/A;   POLYPECTOMY  07/05/2021   Procedure: POLYPECTOMY;  Surgeon: [name], DO;  [facility];  Service: Endoscopy;;   Family History  Problem Relation Age of Onset   Cancer Father        liver   Hypertension Brother    Heart disease Maternal Grandmother    Kidney disease Maternal Grandfather    Diabetes Maternal Grandfather    Kidney failure Brother    Social History:   reports that she has never smoked. She has never used smokeless tobacco. She reports that she does not drink alcohol and does not use drugs.  Medications  Current Facility-Administered Medications:    sodium chloride 0.9 % bolus 1,000 mL, 1,000 mL, Intravenous, Once, Wynona Dove A, DO   sodium chloride flush (NS) 0.9 % injection 3 mL, 3 mL, Intravenous, Once, Jeanell Sparrow, DO  Current Outpatient Medications:    acetaminophen (  TYLENOL) 325 MG tablet, Take 2 tablets (650 mg total) by mouth every 6 (six) hours as needed for mild pain., Disp: , Rfl:    amiodarone (PACERONE) 200 MG tablet, Take 1 tablet (200 mg total) by mouth daily., Disp: , Rfl:    citalopram (CELEXA) 20 MG tablet, Take 1 tablet (20 mg total) by mouth daily., Disp: , Rfl:    diphenhydrAMINE (BENADRYL)  12.5 MG/5ML elixir, Take 5 mLs (12.5 mg total) by mouth every 6 (six) hours as needed for itching., Disp: 120 mL, Rfl: 0   feeding supplement (ENSURE ENLIVE / ENSURE PLUS) LIQD, Take 237 mLs by mouth 3 (three) times daily between meals., Disp: 237 mL, Rfl: 12   ferrous sulfate 325 (65 FE) MG EC tablet, Take 1 tablet (325 mg total) by mouth 2 (two) times daily., Disp: 60 tablet, Rfl: 3   ipratropium-albuterol (DUONEB) 0.5-2.5 (3) MG/3ML SOLN, Take 3 mLs by nebulization every 2 (two) hours as needed., Disp: 360 mL, Rfl:    levothyroxine (SYNTHROID) 150 MCG tablet, Take 1 tablet (150 mcg total) by mouth daily at 6 (six) AM., Disp: , Rfl:    liver oil-zinc oxide (DESITIN) 40 % ointment, Apply topically as needed for irritation., Disp: 56.7 g, Rfl: 0   loratadine (CLARITIN) 10 MG tablet, Take 1 tablet (10 mg total) by mouth daily., Disp: , Rfl:    LORazepam (ATIVAN) 1 MG tablet, Take 1 tablet (1 mg total) by mouth every 6 (six) hours as needed for anxiety., Disp: 30 tablet, Rfl: 0   metoprolol tartrate (LOPRESSOR) 25 MG tablet, Take 0.5 tablets (12.5 mg total) by mouth 2 (two) times daily., Disp: , Rfl:    mirtazapine (REMERON) 7.5 MG tablet, Take 1 tablet (7.5 mg total) by mouth at bedtime., Disp: , Rfl:    Multiple Vitamin (MULTIVITAMIN WITH MINERALS) TABS tablet, Take 1 tablet by mouth daily., Disp: , Rfl:    pantoprazole (PROTONIX) 40 MG tablet, Take 1 tablet (40 mg total) by mouth 2 (two) times daily., Disp: , Rfl:    potassium chloride SA (KLOR-CON) 20 MEQ tablet, TAKE ONE (1) TABLET EACH DAY (Patient not taking: No sig reported), Disp: 90 tablet, Rfl: 1   rivaroxaban (XARELTO) 20 MG TABS tablet, Take 1 tablet (20 mg total) by mouth daily., Disp: 30 tablet, Rfl:    sodium chloride flush (NS) 0.9 % SOLN, 10-40 mLs by Intracatheter route every 12 (twelve) hours., Disp: , Rfl:    sodium chloride flush (NS) 0.9 % SOLN, 10-40 mLs by Intracatheter route as needed (flush)., Disp: , Rfl:    torsemide  (DEMADEX) 20 MG tablet, Take 1 tablet (20 mg total) by mouth daily., Disp: , Rfl:   Exam: Current vital signs: BP 115/79    Pulse (!) 105    Temp 97.6 F (36.4 C) (Rectal)    Resp (!) 21    Wt 112.4 kg    LMP 09/13/2016    SpO2 99%    BMI 45.32 kg/m  Vital signs in last 24 hours: Temp:  [97.6 F (36.4 C)] 97.6 F (36.4 C) (01/14 1213) Pulse Rate:  [79-105] 105 (01/14 1230) Resp:  [14-21] 21 (01/14 1230) BP: (115-143)/(62-92) 115/79 (01/14 1245) SpO2:  [99 %-100 %] 99 % (01/14 1230) Weight:  [112.4 kg] 112.4 kg (01/14 1123)  GENERAL: Awake, not alert, in no acute distress Psych: Unable to assess due to patient's condition Head: Normocephalic and atraumatic, without obvious abnormality EENT: Normal conjunctivae, dry mucous membranes, no OP obstruction LUNGS:  Normal respiratory effort. Non-labored breathing on room air CV: Irregular rate and rhythm on telemetry ABDOMEN: Soft, non-tender, non-distended Extremities: warm, well perfused, lymphedema present in BLE  NEURO:  Mental Status: Awake, not alert.  She does not verbalize throughout assessment to answer orientation questions.  She does not follow commands.   Unable to assess patient's speech as her only vocalizations occur when she yells with application of noxious stimuli.  No neglect noted.  Cranial Nerves:  II: PERRL 4 mm/brisk. Blinks to threat throughout III, IV, VI: VOR intact, she does not fixate or track examiner. Briefly looks in the direction of voice to examiner intermittently.  V: Unable to formally assess sensation due to being nonverbal. Noxious deferred.  VII: Face is has a mild right mouth droop at rest with minimal facial movement throughout examination VIII: Unable to assess due to patient's condition IX, X: Unable to assess soft palate  XI: Head is midline XII: Does not protrude tongue to command Motor: Formal strength assessment is limited by patient not following commands.  With passive arm raise  bilaterally, patient will elevate bilateral upper extremities without vertical drift.  Patient does not move bilateral lower extremities to command but will weakly withdraw to noxious stimuli.  Tone is normal. Bulk is normal.  Sensation: Patient yells with application of noxious stimuli throughout. Coordination: Does not perform  Gait: Deferred for patient safety   NIHSS: 1a Level of Conscious.: 0 1b LOC Questions: 2 1c LOC Commands: 2 2 Best Gaze: 2 3 Visual: 0 4 Facial Palsy: 1 5a Motor Arm - left: 0 5b Motor Arm - Right: 0 6a Motor Leg - Left: 3 6b Motor Leg - Right: 3 7 Limb Ataxia: 0 8 Sensory: 0 9 Best Language: 3 10 Dysarthria: 2 11 Extinct. and Inatten.: 0 TOTAL: 18  Labs I have reviewed labs in epic and the results pertinent to this consultation are: CBC    Component Value Date/Time   WBC 5.2 12/08/2021 1125   RBC 4.21 12/08/2021 1125   HGB 12.2 12/08/2021 1218   HCT 36.0 12/08/2021 1218   PLT 236 12/08/2021 1125   MCV 88.4 12/08/2021 1125   MCH 28.3 12/08/2021 1125   MCHC 32.0 12/08/2021 1125   RDW 16.6 (H) 12/08/2021 1125   LYMPHSABS 1.0 12/08/2021 1125   MONOABS 0.3 12/08/2021 1125   EOSABS 0.1 12/08/2021 1125   BASOSABS 0.1 12/08/2021 1125   CMP     Component Value Date/Time   NA 137 12/08/2021 1218   K 5.4 (H) 12/08/2021 1218   CL 104 12/08/2021 1133   CO2 24 12/08/2021 1125   GLUCOSE 115 (H) 12/08/2021 1133   BUN 17 12/08/2021 1133   CREATININE 0.80 12/08/2021 1133   CALCIUM 8.9 12/08/2021 1125   PROT 6.8 12/08/2021 1125   ALBUMIN 3.0 (L) 12/08/2021 1125   AST 34 12/08/2021 1125   ALT 15 12/08/2021 1125   ALKPHOS 86 12/08/2021 1125   BILITOT 1.3 (H) 12/08/2021 1125   GFRNONAA >60 12/08/2021 1125   GFRAA >90 03/28/2014 0435   Lipid Panel     Component Value Date/Time   CHOL 190 12/05/2012 0531   TRIG 44 09/03/2021 0518   HDL 70 12/05/2012 0531   CHOLHDL 2.7 12/05/2012 0531   VLDL 31 12/05/2012 0531   LDLCALC 89 12/05/2012 0531    Lab Results  Component Value Date   HGBA1C 5.4 07/29/2021   Imaging I have reviewed the images obtained:  CT-scan of the brain 1/14: -  No acute intracranial hemorrhage. No definite acute infarction. - Possible age-indeterminate small vessel infarct of the left corona radiata. - Minor chronic microvascular ischemic changes.  CT angio head and neck 1/14: No large vessel occlusion, hemodynamically significant stenosis, or evidence of dissection.  Assessment: 76 y.o. female with atrial fibrillation on Eliquis, with additional PMHx as above, who presented to the ED via EMS on 1/14 for evaluation of right facial droop, slurred speech, and lethargy after being found by her SNF facility. Her last known well was reported to be 06:30 this morning - Examination reveals patient that is nonverbal but yells with application of noxious stimuli, with mild right mouth droop at rest, and does not follow commands. Her initial NIHSS is 18. No neck stiffness.  - CT imaging is without acute intracranial abnormality and without LVO. Not a candidate for TNKase as she presented outside of the thrombolytic therapy time window and is on anticoagulation. Not a candidate for IR due to vessel imaging without LVO. - DDx for presentation includes toxic-metabolic or infectious encephalopathy, stroke, and seizure with post-ictal state.   Recommendations: - MRI brain when able to obtain - Toxic-metabolic / infectious work up per EDP / primary team - Routine EEG   Pt seen by NP/Neuro and MD. .  Anibal Henderson, AGAC-NP [facility] Pager: [phone]  I have seen and examined the patient. I have formulated the assessment and recommendations. 76 year old female presenting with mild right facial droop and mutism. Exam shows no focal motor deficit in a nonverbal patient who appears acutely confused. No seizure-like activity noted. CT negative for acute abnormality. No LVO on CTA. Not a candidate for TNKase  as she presented outside of the thrombolytic therapy time window and is on anticoagulation. Recommendations include MRI brain, EEG and work up for toxic, metabolic and infectious etiologies.  Electronically signed: Dr. Kerney Elbe

## 2021-12-09 ENCOUNTER — Observation Stay (HOSPITAL_COMMUNITY): Payer: Medicare HMO

## 2021-12-09 ENCOUNTER — Other Ambulatory Visit: Payer: Self-pay

## 2021-12-09 DIAGNOSIS — R410 Disorientation, unspecified: Secondary | ICD-10-CM

## 2021-12-09 LAB — CBC
HCT: 35.7 % — ABNORMAL LOW (ref 36.0–46.0)
Hemoglobin: 11.2 g/dL — ABNORMAL LOW (ref 12.0–15.0)
MCH: 28.1 pg (ref 26.0–34.0)
MCHC: 31.4 g/dL (ref 30.0–36.0)
MCV: 89.7 fL (ref 80.0–100.0)
Platelets: 240 10*3/uL (ref 150–400)
RBC: 3.98 MIL/uL (ref 3.87–5.11)
RDW: 16.4 % — ABNORMAL HIGH (ref 11.5–15.5)
WBC: 6.3 10*3/uL (ref 4.0–10.5)
nRBC: 0 % (ref 0.0–0.2)

## 2021-12-09 LAB — BASIC METABOLIC PANEL
Anion gap: 7 (ref 5–15)
BUN: 8 mg/dL (ref 8–23)
CO2: 25 mmol/L (ref 22–32)
Calcium: 8.5 mg/dL — ABNORMAL LOW (ref 8.9–10.3)
Chloride: 102 mmol/L (ref 98–111)
Creatinine, Ser: 0.95 mg/dL (ref 0.44–1.00)
GFR, Estimated: >60 mL/min (ref >60)
Glucose, Bld: 126 mg/dL — ABNORMAL HIGH (ref 70–99)
Potassium: 3.3 mmol/L — ABNORMAL LOW (ref 3.5–5.1)
Sodium: 134 mmol/L — ABNORMAL LOW (ref 135–145)

## 2021-12-09 LAB — LACTIC ACID, PLASMA: Lactic Acid, Venous: 1.5 mmol/L (ref 0.5–1.9)

## 2021-12-09 MED ORDER — POTASSIUM CHLORIDE CRYS ER 20 MEQ PO TBCR
40.0000 meq | EXTENDED_RELEASE_TABLET | Freq: Once | ORAL | Status: AC
  Administered 2021-12-09: 40 meq via ORAL
  Filled 2021-12-09: qty 2

## 2021-12-09 MED ORDER — HYDROXYZINE HCL 25 MG PO TABS
25.0000 mg | ORAL_TABLET | Freq: Once | ORAL | Status: AC
  Administered 2021-12-09: 25 mg via ORAL
  Filled 2021-12-09: qty 1

## 2021-12-09 MED ORDER — MIRTAZAPINE 15 MG PO TABS
7.5000 mg | ORAL_TABLET | Freq: Every day | ORAL | Status: DC
  Administered 2021-12-10 (×2): 7.5 mg via ORAL
  Filled 2021-12-09 (×2): qty 1

## 2021-12-09 MED ORDER — POTASSIUM CHLORIDE CRYS ER 20 MEQ PO TBCR: 20.0000 meq | EXTENDED_RELEASE_TABLET | Freq: Once | ORAL | Status: DC

## 2021-12-09 NOTE — ED Notes (Signed)
pt continues to have altered train of thought and unable to concentrate at task of taking medication, paranoid. PO fluid tolerated. VSS.

## 2021-12-09 NOTE — Evaluation (Signed)
Physical Therapy Evaluation Patient Details Name: Lauren [name] MRN: [id number] DOB: Apr 28, 1946 Today's Date: 12/09/2021  History of Present Illness  76 y.o. female presents to [facility] hospital on 12/08/2021 with AMS. CXR with atelectasis or consolidation in LL lobe with small L pleural effusion. MRI head negative. PMH includes afib, colon cancer, CHF, lymphedema, hypothyroidism, HTN, anxiety, depression.  Clinical Impression  Pt presents with deficits in gait, balance, power, endurance, functional mobility. Pt is largely limited by confusion and paranoia. Pt perseverating on the hospital staff attempting to harm her and her family, as well as her spouse not loving her and cheating on her. Difficult to redirect the pt to participate in mobility. Pt requires some assistance to mobilize this session due to core weakness and impaired balance. Pt will benefit from aggressive mobilization to aide in restoring independence in mobility. PT recommends a return to SNF due to high falls risk and safety concerns due to impaired cognition.       Recommendations for follow up therapy are one component of a multi-disciplinary discharge planning process, led by the attending physician.  Recommendations may be updated based on patient status, additional functional criteria and insurance authorization.  Follow Up Recommendations Skilled nursing-short term rehab (<3 hours/day)    Assistance Recommended at Discharge Frequent or constant Supervision/Assistance  Patient can return home with the following  A little help with walking and/or transfers;A lot of help with bathing/dressing/bathroom;Direct supervision/assist for medications management;Direct supervision/assist for financial management;Assistance with cooking/housework    Equipment Recommendations  (TBD)  Recommendations for Other Services       Functional Status Assessment Patient has had a recent decline in their functional status and demonstrates the  ability to make significant improvements in function in a reasonable and predictable amount of time.     Precautions / Restrictions Precautions Precautions: Fall Restrictions Weight Bearing Restrictions: No      Mobility  Bed Mobility Overal bed mobility: Needs Assistance Bed Mobility: Supine to Sit;Sit to Supine     Supine to sit: Min assist Sit to supine: Min assist   General bed mobility comments: minA due to high bed, may perform sit to supine without assist if lower bed height    Transfers Overall transfer level: Needs assistance Equipment used: 1 person hand held assist Transfers: Sit to/from Stand Sit to Stand: Min assist                Ambulation/Gait Ambulation/Gait assistance: Min guard Gait Distance (Feet): 20 Feet Assistive device: Rolling walker (2 wheels) Gait Pattern/deviations: Wide base of support;Step-through pattern Gait velocity: reduced Gait velocity interpretation: <1.8 ft/sec, indicate of risk for recurrent falls   General Gait Details: pt with slowed step-through gait, widened BOS  Stairs            Wheelchair Mobility    Modified Rankin (Stroke Patients Only)       Balance Overall balance assessment: Needs assistance Sitting-balance support: No upper extremity supported;Feet supported Sitting balance-Leahy Scale: Fair     Standing balance support: Bilateral upper extremity supported;Reliant on assistive device for balance Standing balance-Leahy Scale: Poor                               Pertinent Vitals/Pain Pain Assessment: No/denies pain    Home Living Family/patient expects to be discharged to:: Skilled nursing facility  Additional Comments: per chart review of previous admissions the pt had been living in a one level home with a friend, 1 step to enter, ambulating household distances with a RW prior to admission in August. Has been at [facility] since last admission    Prior  Function Prior Level of Function : Independent/Modified Independent             Mobility Comments: pt was independent, ambulating household distances with a RW prior to admission in August per chart review. Pt reports ambulating in therapy multiple laps with walker recently at SNF.       Hand Dominance   Dominant Hand: Right    Extremity/Trunk Assessment   Upper Extremity Assessment Upper Extremity Assessment: Generalized weakness    Lower Extremity Assessment Lower Extremity Assessment: Generalized weakness    Cervical / Trunk Assessment Cervical / Trunk Assessment: Other exceptions Cervical / Trunk Exceptions: excess body habitus  Communication   Communication:  (tangential conversation)  Cognition Arousal/Alertness: Awake/alert Behavior During Therapy: Anxious Overall Cognitive Status: Impaired/Different from baseline Area of Impairment: Orientation;Attention;Memory;Following commands;Safety/judgement;Awareness;Problem solving                 Orientation Level: Disoriented to;Situation;Time;Place Current Attention Level: Focused Memory: Decreased short-term memory Following Commands: Follows one step commands consistently Safety/Judgement: Decreased awareness of safety;Decreased awareness of deficits Awareness: Intellectual Problem Solving: Difficulty sequencing;Requires verbal cues General Comments: pt with tangential paranoid thought. Pt perseverating on hospital staff trying to harm her or her family. Pt also perseverating on her spouse leaving her for another woman, not loving her anymore. Later during the session pt reports her spouse died in 63.        General Comments General comments (skin integrity, edema, etc.): VSS on RA, afib noted on monitor    Exercises     Assessment/Plan    PT Assessment Patient needs continued PT services  PT Problem List Decreased strength;Decreased activity tolerance;Decreased balance;Decreased mobility;Decreased  cognition;Decreased knowledge of use of DME;Decreased safety awareness       PT Treatment Interventions DME instruction;Gait training;Functional mobility training;Therapeutic activities;Therapeutic exercise;Stair training;Balance training;Cognitive remediation;Neuromuscular re-education;Patient/family education    PT Goals (Current goals can be found in the Care Plan section)  Acute Rehab PT Goals Patient Stated Goal: to return to prior level of function PT Goal Formulation: Patient unable to participate in goal setting (due to tangential paranoia) Time For Goal Achievement: 12/23/21 Potential to Achieve Goals: Fair    Frequency Min 2X/week     Co-evaluation               AM-PAC PT "6 Clicks" Mobility  Outcome Measure Help needed turning from your back to your side while in a flat bed without using bedrails?: A Little Help needed moving from lying on your back to sitting on the side of a flat bed without using bedrails?: A Little Help needed moving to and from a bed to a chair (including a wheelchair)?: A Little Help needed standing up from a chair using your arms (e.g., wheelchair or bedside chair)?: A Little Help needed to walk in hospital room?: A Little Help needed climbing 3-5 steps with a railing? : Total 6 Click Score: 16    End of Session   Activity Tolerance: Patient tolerated treatment well (limited by anxiety and paranoia) Patient left: in bed;with call bell/phone within reach Nurse Communication: Mobility status PT Visit Diagnosis: Other abnormalities of gait and mobility (R26.89);Muscle weakness (generalized) (M62.81)    Time: 3810-1751 PT Time Calculation (min) (  ACUTE ONLY): 33 min   Charges:   PT Evaluation $PT Eval Low Complexity: [place], PT, DPT Acute Rehabilitation Pager: [phone] Office [place] 12/09/2021, 4:29 PM

## 2021-12-09 NOTE — Procedures (Signed)
[facility] Consult [facility]  Routine EEG Report  Duration: 21 min  Patient Name:   Lauren [name], Lauren [name] Date of Birth:   [birth year] Identification Number:   MRN - [id number]  Date of Study:   12/09/2021 10:29:45  Indication: Encephalopathy,  Technical Summary: A routine 20 channel electroencephalogram using the international 10-20 system of electrode placement was performed.  Background: 7-8 Hz, Posterior dominant rhythm that attenuated with eye Opening  States       Awake  Abnormalities  Generalized Slowing: Diffuse generalized slowing. There was generalized intermittent rhythmic delta activity.   Activation Procedures  Hyperventilation: Not performed  Photic Stimulation: Not performed  Classification: Abnormal :  Diagnosis: This an abnormal EEG due to mild diffuse slowing and GIRDA. Findings consistent with a non-specific encephalopathy.      Dr Tsosie Billing   [facility] [phone]  Case [id number]

## 2021-12-09 NOTE — ED Notes (Signed)
Attempted to encourage patient to take prescription, pt continues to have altered train of thought and unable to concentrate at task of taking medication, paranoid

## 2021-12-09 NOTE — Progress Notes (Signed)
Called [facility].  Will not be able to take patient back until talking to social work.  Plan to call tomorrow.  [place], DO 12/09/2021, 3:58 PM PGY-3, [place]

## 2021-12-09 NOTE — Progress Notes (Signed)
EEG done at bedside with confused and agitated patient. No skin breakdown noted. Results pending

## 2021-12-09 NOTE — Care Management Obs Status (Signed)
[place] NOTIFICATION   Patient Details  Name: Lauren [name] MRN: [id number] Date of Birth: 1946-06-25   Medicare Observation Status Notification Given:  Ernesta Amble, RN 12/09/2021, 4:50 PM

## 2021-12-09 NOTE — Progress Notes (Signed)
MRI brain:  No evidence of acute infarction, hemorrhage, or mass. Mild chronic microvascular ischemic changes.  EEG is pending.   Electronically signed: Dr. Kerney Elbe

## 2021-12-09 NOTE — Progress Notes (Signed)
Went to round on patient at the start of night shift.  Found patient resting comfortably in bed.  Patient was alert and oriented person, place, time, situation.  She was in no distress.  BP 110/74 (BP Location: Right Arm)    Pulse (!) 105    Temp 98 F (36.7 C) (Oral)    Resp 15    Ht 5\' 2"  (1.575 m)    Wt 112 kg    LMP 09/13/2016    SpO2 98%    BMI 45.18 kg/m  General: Elderly female lying in bed, A&O x4 Respiratory: Clear to auscultation bilaterally Cardiac: Tachycardic, irregular rhythm, no murmurs  A/P Patient initially presenting with intermittent confusion.  Work-up is ongoing as documented in today's progress note.  She has no concerns tonight.

## 2021-12-09 NOTE — Progress Notes (Signed)
Family Medicine Teaching Service Daily Progress Note Intern Pager: [phone]  Patient name: Lauren [name] Medical record number: [id number] Date of birth: [birth year] Age: 76 y.o. Gender: female  Primary Care Provider: Pcp, No Consultants: N/a Code Status: FULL  Pt Overview and Major Events to Date:  1/14- Admittted  Assessment and Plan: Lauren [name] is a 76 y.o. female presenting with altered mental status concerning for CVA. PMH is significant for A. Fib on DOAC, colon cancer IIIB, mixed sys/dia CHF, lymphedema, hypothyroidism, HTN, Anxiety/Depression.]  Encephalopathy, etiology unknown, Resolved.  Patient is able to state her name, place, year.  Appears to be at a normal baseline.  States she would like drinks at her bedside when she wakes up from a nap as well as a different meal tomorrow morning.  CT and MRI negative for acute changes.  Urinalysis negative for urinary tract infection.  Renal and hepatic dysfunction ruled out as etiology.  Possible infectious state with findings on chest x-ray suggestive for lobar consolidation.  Can consider treating although less likely true pneumonia with lack of fever and normal white count.  Consider delirium or substance ingestion (anxiety medications or nonprescription).  Can also consider other causes of encephalopathy such as seizures.  Will need to follow-up EEG and UDS. -F/u EEG -F/u UDS -Cardiac mon -vitals per floor -Continue to monitor mental status -Find patient baseline mental status -Will contact patient's SNF for more information on baseline status and medication    A. Fib on DOAC Most recent heart rate 58.  Patient taking Eliquis 5 mg daily BID, metoprolol tartrate 12.5 mg BID, amiodarone 200 mg daily. -Continue Eliquis and metoprolol -Continue to monitor   Mixed systolic and diastolic CHF  242 mL UOP.  CR 0.95.  Patient taking amiodarone 200 mg daily, metoprolol tartrate 12.5 mg BID, torosemide 20 daily, per chart review.  Cannot confirm meds due to patient mental status. -Continue torsemide, metoprolol - Monitor I's and O's   Lymphedema Patient taking Demadex with K supplementation.  Creatinine appropriate potassium 3.3 -Continue Demadex, hold home K supplementation - Replete potassium as needed   Hypothyroidism  TSH 5.056.  Home synthroid 150 mcg daily per chart review  -Increased to 175 mcg daily yesterday continue current dose   HTN  BP 146/61 -Monitor on routine vital signs   Anxiety/Depression Patient taking Celexa 20 mg, Remeron 7.5 mg qhs, ativan 1 mg q6h prn.  Attempted to call patient's brother, no answer.  Reviewed med rec patient not taking Celexa.  Patient is taking Remeron at current dose.  And patient is taking Ativan 1 mg every 8 hours as needed for anxiety. -Hold Celexa - Hold Ativan until it is necessary - Can continue Remeron at bedtime for sleep  FEN/GI: Heart Healthy diet Prophylaxis: Full dose Eliquis   Dispo:SNF pending clinical improvement .   Subjective:  States she is doing well.  Continues to be tired and would like to sleep.  Objective: Temp:  [97.6 F (36.4 C)-98.2 F (36.8 C)] 98 F (36.7 C) (01/15 0100) Pulse Rate:  [58-113] 58 (01/15 0100) Resp:  [12-23] 20 (01/15 0100) BP: (115-146)/(61-110) 146/61 (01/15 0100) SpO2:  [95 %-100 %] 95 % (01/15 0100) Weight:  [112.4 kg] 112.4 kg (01/14 1123)  Physical Exam: General: Initially sleeping easily aroused, no acute distress. Age appropriate. Cardiac: RRR, normal heart sounds, no murmurs Respiratory: CTAB, normal effort Abdomen: soft, nontender, nondistended Extremities: Lower extremity lymphedema bilaterally Skin: Warm and dry, no rashes noted Neuro: alert and oriented  Psych: normal affect  Laboratory: Recent Labs  Lab 12/08/21 1125 12/08/21 1133 12/08/21 1218 12/09/21 0329  WBC 5.2  --   --  6.3  HGB 11.9* 12.6 12.2 11.2*  HCT 37.2 37.0 36.0 35.7*  PLT 236  --   --  240   Recent Labs  Lab  12/08/21 1125 12/08/21 1133 12/08/21 1218 12/09/21 0329  NA 138 137 137 134*  K 4.7 5.5* 5.4* 3.3*  CL 103 104  --  102  CO2 24  --   --  25  BUN 13 17  --  8  CREATININE 0.93 0.80  --  0.95  CALCIUM 8.9  --   --  8.5*  PROT 6.8  --   --   --   BILITOT 1.3*  --   --   --   ALKPHOS 86  --   --   --   ALT 15  --   --   --   AST 34  --   --   --   GLUCOSE 119* 115*  --  126*    Imaging/Diagnostic Tests: MRI HEAD WITHOUT CONTRAST IMPRESSION: No evidence of acute infarction, hemorrhage, or mass. Mild chronic microvascular ischemic changes.   CT ABDOMEN AND PELVIS WITH CONTRAST IMPRESSION: There is no evidence intestinal obstruction or pneumoperitoneum. There is no hydronephrosis.   Diverticulosis of colon without signs of diverticulitis. Status post cholecystectomy. There is dilation of bile ducts which may be related to previous cholecystectomy or stricture at the ampulla. Please correlate with laboratory findings.   Small left pleural effusion. Linear densities in the lower lung fields may suggest scarring or subsegmental atelectasis.  PORTABLE CHEST 1 VIEW  IMPRESSION: 1. Atelectasis and/or consolidation in the left lower lobe with small left pleural effusion.  CT ANGIOGRAPHY HEAD AND NECK IMPRESSION: No large vessel occlusion, hemodynamically significant stenosis, or evidence of dissection.    Lauren Fee, DO 12/09/2021, 7:47 AM PGY-3, [place] Intern pager: [phone], text pages welcome

## 2021-12-09 NOTE — ED Notes (Signed)
Anxious, talking loudly to people who are not present, hallucinations and paranoia continue at this time.

## 2021-12-09 NOTE — ED Notes (Signed)
Pt is talking to someone name Lanny Hurst and her daughter Barnett Applebaum. She is having a conversation with them.  However they are not here.

## 2021-12-10 DIAGNOSIS — R410 Disorientation, unspecified: Secondary | ICD-10-CM | POA: Diagnosis not present

## 2021-12-10 LAB — CBC
HCT: 36.1 % (ref 36.0–46.0)
Hemoglobin: 11.6 g/dL — ABNORMAL LOW (ref 12.0–15.0)
MCH: 28.8 pg (ref 26.0–34.0)
MCHC: 32.1 g/dL (ref 30.0–36.0)
MCV: 89.6 fL (ref 80.0–100.0)
Platelets: 214 10*3/uL (ref 150–400)
RBC: 4.03 MIL/uL (ref 3.87–5.11)
RDW: 16.4 % — ABNORMAL HIGH (ref 11.5–15.5)
WBC: 6.4 10*3/uL (ref 4.0–10.5)
nRBC: 0 % (ref 0.0–0.2)

## 2021-12-10 LAB — BASIC METABOLIC PANEL
Anion gap: 7 (ref 5–15)
BUN: 8 mg/dL (ref 8–23)
CO2: 26 mmol/L (ref 22–32)
Calcium: 8.6 mg/dL — ABNORMAL LOW (ref 8.9–10.3)
Chloride: 106 mmol/L (ref 98–111)
Creatinine, Ser: 0.96 mg/dL (ref 0.44–1.00)
GFR, Estimated: >60 mL/min (ref >60)
Glucose, Bld: 79 mg/dL (ref 70–99)
Potassium: 3.7 mmol/L (ref 3.5–5.1)
Sodium: 139 mmol/L (ref 135–145)

## 2021-12-10 NOTE — Progress Notes (Addendum)
Patient Discharged to home skill nursing facility Via transport, pt very tearful and verbalized concerns about her safety annd comfort at the facility. All questions were answer pt otherwise in no acute distress.

## 2021-12-10 NOTE — ED Notes (Signed)
Breakfast orders placed 

## 2021-12-10 NOTE — Discharge Summary (Signed)
[facility] Discharge Summary  Patient name: Lauren [name] Medical record number: [id number] Date of birth: [birth year] Age: 76 y.o. Gender: female Date of Admission: 12/08/2021  Date of Discharge: 12/10/2021 Admitting Physician: Lauren Covert, MD  Primary Care Provider: Pcp, No Consultants: None  Indication for Hospitalization: AMS, concern for CVA  Discharge Diagnoses/Problem List:  AMS  Disposition: SNF  Discharge Condition: Stable  Discharge Exam:  General: NAD, laying in bed comfortably Cardiovascular: RRR no murmurs rubs or gallops Respiratory: Clear to auscultation bilaterally no wheezes rales or crackles Abdomen: Soft, nontender Extremities: Chronic lymphedema Neuro: CN III, IV,VI: EOMI CV V: Normal sensation in V1, V2, V3 CVII: Symmetric smile and brow raise CN VIII: Normal hearing CN IX,X: Symmetric palate raise  CN XI: 5/5 shoulder shrug CN XII: Symmetric tongue protrusion  UE and LE strength 5/5 Normal sensation in UE and LE bilaterally   Brief Hospital Course:  Illness Lauren [name] is a 76 year old female who presented with altered mental status concerning for CVA.  Past medical history significant for A. fib on DOAC, colon cancer 3B, mixed systolic/diastolic CHF, lymphedema, hypothyroidism, hypertension, anxiety/depression  Encephalopathy   CVA rule out Came from SNF with altered mental status lethargy speech changes different from her baseline.  MRI brain and CT brain were obtained which came back negative.  Did not have any sources of infection did have a chest x-ray suggestive of lobar consolidation but did not have any fevers or shortness of breath.  Sedating medication such as Ativan possibility for difference in mental status.  We held to these medications during hospitalization. delirium a possibility as well.  EEG was obtained which showed no epileptiform discharges or seizures.  Cephalopathy resolved during hospitalization  patient was alert and oriented x4.  A. fib on DOAC Was kept on her Eliquis 5 mg twice daily.  Metoprolol and amiodarone were held due to medication reconciliation.  Restarting both of these on discharge given patient's heart rate.  Chronic conditions remained stable    Issues for Follow Up:  Has a listed iron tablet on her home medications, follow-up need for taking this We discontinued benadryl, flexeril, ativan, and percocet in hospital with improvement in mental status Duoneb listed on her home medications however reassess need for this medication We increased her Synthroid from 150>175, follow-up with thyroid function We stopped ativan, medication in hospital, needs long-term anxiety controller medication such as SSRI and  Patient is interested and would benefit from outpatient counseling/ psychiatry for grief/depression  Significant Procedures: EEG  Significant Labs and Imaging:  Recent Labs  Lab 12/08/21 1125 12/08/21 1133 12/08/21 1218 12/09/21 0329 12/10/21 0710  WBC 5.2  --   --  6.3 6.4  HGB 11.9*   < > 12.2 11.2* 11.6*  HCT 37.2   < > 36.0 35.7* 36.1  PLT 236  --   --  240 214   < > = values in this interval not displayed.   Recent Labs  Lab 12/08/21 1125 12/08/21 1133 12/08/21 1218 12/09/21 0329 12/10/21 0710  NA 138 137 137 134* 139  K 4.7 5.5* 5.4* 3.3* 3.7  CL 103 104  --  102 106  CO2 24  --   --  25 26  GLUCOSE 119* 115*  --  126* 79  BUN 13 17  --  8 8  CREATININE 0.93 0.80  --  0.95 0.96  CALCIUM 8.9  --   --  8.5* 8.6*  ALKPHOS 86  --   --   --   --  AST 34  --   --   --   --   ALT 15  --   --   --   --   ALBUMIN 3.0*  --   --   --   --     Results/Tests Pending at Time of Discharge: UDS, VBG  Discharge Medications:  Allergies as of 12/10/2021       Reactions   Codeine Itching, Other (See Comments)   "Allergic," per [facility]   Compazine [prochlorperazine Edisylate] Other (See Comments)   "Allergic," per [facility]   Prednisone Nausea And  Vomiting, Other (See Comments)   "Allergic," per [facility]   Tetanus Toxoids Other (See Comments)   Knot like "hen's egg" came up and "Allergic," per [facility]   Vicodin [hydrocodone-acetaminophen] Itching, Other (See Comments)   "Allergic," per [facility]        Medication List     STOP taking these medications    citalopram 20 MG tablet Commonly known as: CELEXA   cyclobenzaprine 10 MG tablet Commonly known as: FLEXERIL   diphenhydrAMINE 12.5 MG/5ML elixir Commonly known as: BENADRYL   guaiFENesin-dextromethorphan 100-10 MG/5ML syrup Commonly known as: ROBITUSSIN DM   loperamide 2 MG tablet Commonly known as: IMODIUM A-D   loratadine 10 MG tablet Commonly known as: CLARITIN   LORazepam 1 MG tablet Commonly known as: ATIVAN   melatonin 3 MG Tabs tablet   ondansetron 4 MG tablet Commonly known as: ZOFRAN   Percocet 5-325 MG tablet Generic drug: oxyCODONE-acetaminophen   rivaroxaban 20 MG Tabs tablet Commonly known as: XARELTO   sodium chloride flush 0.9 % Soln Commonly known as: NS       TAKE these medications    acetaminophen 325 MG tablet Commonly known as: TYLENOL Take 2 tablets (650 mg total) by mouth every 6 (six) hours as needed for mild pain.   amiodarone 200 MG tablet Commonly known as: PACERONE Take 1 tablet (200 mg total) by mouth daily.   cetirizine 10 MG tablet Commonly known as: ZYRTEC Take 10 mg by mouth daily as needed (for itching).   Eliquis 5 MG Tabs tablet Generic drug: apixaban Take 5 mg by mouth in the morning and at bedtime.   feeding supplement Liqd Take 237 mLs by mouth 3 (three) times daily between meals.   ferrous sulfate 325 (65 FE) MG EC tablet Take 1 tablet (325 mg total) by mouth 2 (two) times daily.   ipratropium-albuterol 0.5-2.5 (3) MG/3ML Soln Commonly known as: DUONEB Take 3 mLs by nebulization every 2 (two) hours as needed.   levothyroxine 175 MCG tablet Commonly known as: SYNTHROID Take 175 mcg by mouth daily before  breakfast.   liver oil-zinc oxide 40 % ointment Commonly known as: DESITIN Apply topically as needed for irritation.   metoprolol tartrate 25 MG tablet Commonly known as: LOPRESSOR Take 0.5 tablets (12.5 mg total) by mouth 2 (two) times daily.   mirtazapine 7.5 MG tablet Commonly known as: REMERON Take 1 tablet (7.5 mg total) by mouth at bedtime.   multivitamin with minerals Tabs tablet Take 1 tablet by mouth daily.   OXYGEN Inhale 2 L/min into the lungs as needed (for shortness of breath).   pantoprazole 40 MG tablet Commonly known as: PROTONIX Take 1 tablet (40 mg total) by mouth 2 (two) times daily.   potassium chloride SA 20 MEQ tablet Commonly known as: KLOR-CON M TAKE ONE (1) TABLET EACH DAY What changed: See the new instructions.   torsemide 20 MG tablet Commonly  known as: DEMADEX Take 1 tablet (20 mg total) by mouth daily. What changed: when to take this   Vitamin D3 25 MCG (1000 UT) Caps Take 1,000 Units by mouth in the morning.        Discharge Instructions: Please refer to Patient Instructions section of EMR for full details.  Patient was counseled important signs and symptoms that should prompt return to medical care, changes in medications, dietary instructions, activity restrictions, and follow up appointments.   Follow-Up Appointments:  Contact information for after-discharge care     Destination     [facility] Preferred SNF .   Service: Skilled Nursing Contact information: [address] [place], [place], MD 12/10/2021, 2:12 PM PGY-1, [place]

## 2021-12-10 NOTE — Plan of Care (Signed)

## 2021-12-10 NOTE — TOC Transition Note (Signed)
Transition of Care [facility]) - CM/SW Discharge Note   Patient Details  Name: Lauren [name] MRN: [id number] Date of Birth: 1946/06/23  Transition of [facility]) CM/SW Contact:  Geralynn Ochs, LCSW Phone Number: 12/10/2021, 2:36 PM   Clinical Narrative:   CSW confirmed patient is from [facility] long term, but hopeful for insurance approval. CSW submitted for auth, but Kathleen Argue will take back regardless.  Nurse to call report to [phone].    Final next level of care: Skilled Nursing Facility Barriers to Discharge: Barriers Resolved   Patient Goals and CMS Choice        Discharge Placement              Patient chooses bed at: [facility] Patient to be transferred to facility by: [place] Name of family member notified: Legrand Como Patient and family notified of of transfer: 12/10/21  Discharge Plan and Services                                     Social Determinants of Health (SDOH) Interventions     Readmission Risk Interventions Readmission Risk Prevention Plan 07/12/2021  Transportation Screening Complete  Home Care Screening Complete  Medication Review (RN CM) Complete  Some recent data might be hidden

## 2021-12-10 NOTE — Progress Notes (Signed)
Family Medicine Teaching Service Daily Progress Note Intern Pager: [phone]  Patient name: Lauren [name] Medical record number: [id number] Date of birth: 1946-07-05 Age: 76 y.o. Gender: female  Primary Care Provider: Pcp, No Consultants: None Code Status: Full  Pt Overview and Major Events to Date:  1/14 admitted  Assessment and Plan:  Lauren [name] is a 76 year old female presenting with altered mental status concerning for CVA.  Past medical history significant for A. fib on DOAC, colon cancer 3B, mixed systolic/diastolic CHF, lymphedema, hypothyroidism, high pretension, anxiety depression  Encephalopathy, etiology unknown, resolved Patient is oriented to person, place, time and situation.  Did have a chest x-ray suggestive of lobar consolidation, WBC is 6.3 today and has remained afebrile.  EEG had mild diffuse slowing and GIRDA consistent with a nonspecific encephalopathy.  UDS was collected but has not been resulted. -Cardiac monitoring -Vitals per floor protocol -Monitor mental status -Contact SNF for baseline and medications  A. fib on DOAC Heart rates have been in the low 100s to 1 teens.  Home medications of Eliquis 5 mg twice daily and metoprolol 12.5 twice daily and amiodarone 200 mg daily. -Eliquis and metoprolol -restart home metoprolol -Monitor heart rates  Mixed systolic and diastolic CHF UOP 016 mL.   -Continue torsemide  -Monitor I's and O's  Lymphedema Patient is taking Demadex with K supplementation.  Creatinine 0.93.  -Continue Demadex, hold-K supplementation -Replete potassium as needed  Hypothyroidism TSH 5.056.  Home medications Synthroid increased to 175 MCG daily yesterday -Continue current dose  HTN BP were soft around 100s over 60s to 70s. -Monitor  Anxiety/depression Talked extensively about grief from husband loss this morning, Interested in outpatient counseling -Outpatient counseling -Hold Celexa (hasn't been taking) -Hold Ativan  until necessary -Remeron at bedtime for sleep  FEN/GI: Heart healthy diet PPx: Full dose Eliquis Dispo:SNF after neuro recommendations  Subjective:  No acute issues overnight  Objective: Temp:  [98 F (36.7 C)] 98 F (36.7 C) (01/15 1827) Pulse Rate:  [47-147] 100 (01/16 0230) Resp:  [15-32] 16 (01/16 0430) BP: (62-140)/(41-128) 96/50 (01/16 0430) SpO2:  [94 %-100 %] 94 % (01/16 0230) Weight:  [010 kg] 112 kg (01/15 1306) Physical Exam: General: NAD, laying in bed comfortably Cardiovascular: RRR no murmurs rubs or gallops Respiratory: Clear to auscultation bilaterally no wheezes rales or crackles Abdomen: Soft, nontender Extremities: Chronic lymphedema Neuro: CN III, IV,VI: EOMI CV V: Normal sensation in V1, V2, V3 CVII: Symmetric smile and brow raise CN VIII: Normal hearing CN IX,X: Symmetric palate raise  CN XI: 5/5 shoulder shrug CN XII: Symmetric tongue protrusion  UE and LE strength 5/5 Normal sensation in UE and LE bilaterally   Laboratory: Recent Labs  Lab 12/08/21 1125 12/08/21 1133 12/08/21 1218 12/09/21 0329  WBC 5.2  --   --  6.3  HGB 11.9* 12.6 12.2 11.2*  HCT 37.2 37.0 36.0 35.7*  PLT 236  --   --  240   Recent Labs  Lab 12/08/21 1125 12/08/21 1133 12/08/21 1218 12/09/21 0329  NA 138 137 137 134*  K 4.7 5.5* 5.4* 3.3*  CL 103 104  --  102  CO2 24  --   --  25  BUN 13 17  --  8  CREATININE 0.93 0.80  --  0.95  CALCIUM 8.9  --   --  8.5*  PROT 6.8  --   --   --   BILITOT 1.3*  --   --   --  ALKPHOS 86  --   --   --   ALT 15  --   --   --   AST 34  --   --   --   GLUCOSE 119* 115*  --  126*     Imaging/Diagnostic Tests:   Gerrit Heck, MD 12/10/2021, 6:51 AM PGY-1, [place] Intern pager: [phone], text pages welcome

## 2021-12-10 NOTE — Progress Notes (Signed)
Brief Neuro Update:  Routine EEG with no epileptiform discharges, no seizures. MRI Brain with no acute infarcts, no ICH.  Encephalopathy has resolved.  We will signoff. No further recommendations at this time. Please feel free to contact us with any questions or concerns.  [place] Pager Number [phone]

## 2021-12-10 NOTE — Evaluation (Signed)
Occupational Therapy Evaluation and Discharge Patient Details Name: LEEANNE [name] MRN: [id number] DOB: 08/29/1946 Today's Date: 12/10/2021   History of Present Illness 76 y.o. female presents to [facility] hospital on 12/08/2021 with AMS. CXR with atelectasis or consolidation in LL lobe with small L pleural effusion. MRI head negative. PMH includes afib, colon cancer, CHF, lymphedema, hypothyroidism, HTN, anxiety, depression.   Clinical Impression   This 76 yo female admitted with above presents to acute OT with what appears close to most current baseline level of functioning where she is not suppose to get up without A from staff at [facility] and she needs prn A with basic ADLs. No further acute OT needs, but may be able to progress to a Mod I-S level if gets further therapy at SNF; staff there would know best since they have been with her since August of this past year. Acute OT will sign off.     Recommendations for follow up therapy are one component of a multi-disciplinary discharge planning process, led by the attending physician.  Recommendations may be updated based on patient status, additional functional criteria and insurance authorization.   Follow Up Recommendations  Skilled nursing-short term rehab (<3 hours/day)    Assistance Recommended at Discharge Frequent or constant Supervision/Assistance  Patient can return home with the following A little help with walking and/or transfers;A little help with bathing/dressing/bathroom;Assistance with cooking/housework;Direct supervision/assist for financial management;Direct supervision/assist for medications management    Functional Status Assessment  Patient has had a recent decline in their functional status and demonstrates the ability to make significant improvements in function in a reasonable and predictable amount of time.  Equipment Recommendations  None recommended by OT       Precautions / Restrictions Precautions Precautions:  Fall Restrictions Weight Bearing Restrictions: No      Mobility Bed Mobility Overal bed mobility: Needs Assistance Bed Mobility: Supine to Sit     Supine to sit: Supervision;HOB elevated          Transfers Overall transfer level: Needs assistance Equipment used: Rolling walker (2 wheels) Transfers: Sit to/from Stand Sit to Stand: Min guard                  Balance Overall balance assessment: Needs assistance Sitting-balance support: No upper extremity supported;Feet supported Sitting balance-Leahy Scale: Fair     Standing balance support: No upper extremity supported;During functional activity Standing balance-Leahy Scale: Fair Standing balance comment: standing at sink to wash hands                           ADL either performed or assessed with clinical judgement   ADL Overall ADL's : Needs assistance/impaired Eating/Feeding: Independent;Sitting   Grooming: Min guard;Wash/dry hands;Standing   Upper Body Bathing: Set up;Sitting   Lower Body Bathing: Minimal assistance Lower Body Bathing Details (indicate cue type and reason): min guard A sit<>stand Upper Body Dressing : Set up;Standing   Lower Body Dressing: Minimal assistance Lower Body Dressing Details (indicate cue type and reason): min guard  A sit<>stand Toilet Transfer: Minimal assistance;Ambulation;Rolling walker (2 wheels);Comfort height toilet;Grab bars   Toileting- Clothing Manipulation and Hygiene: Minimal assistance Toileting - Clothing Manipulation Details (indicate cue type and reason): min guard A sit<>stand             Vision Patient Visual Report: No change from baseline              Pertinent Vitals/Pain Pain Assessment: Faces Faces  Pain Scale: Hurts a little bit Pain Location: back of her legs with ambulation ("it's because they are weak) Pain Descriptors / Indicators: Aching;Sore Pain Intervention(s): Limited activity within patient's tolerance;Monitored  during session;Repositioned     Hand Dominance Right   Extremity/Trunk Assessment Upper Extremity Assessment Upper Extremity Assessment: Generalized weakness           Communication Communication Communication: No difficulties (very talkative with much of talking about her family (dtr and deceased husband--but she isn't sure that he is))   Cognition Arousal/Alertness: Awake/alert Behavior During Therapy:  (excessive talking) Overall Cognitive Status: No family/caregiver present to determine baseline cognitive functioning                                 General Comments: oriented x 4 today, excessive talking about her husband who she reports they have told her he is deceased (but she is not sure if she truly believes this) and relationship with dtr appears to be strained as well per her conversation                Home Living Family/patient expects to be discharged to:: Skilled nursing facility                                        Prior Functioning/Environment Prior Level of Function : Needs assist             Mobility Comments: reports she has been told not get to get up by herself at SNF, to call for A. ADLs Comments: Reports has minimal A with basic ADLs        OT Problem List: Decreased range of motion;Impaired balance (sitting and/or standing);Obesity         OT Goals(Current goals can be found in the care plan section) Acute Rehab OT Goals Patient Stated Goal: to get better and be able to go home         AM-PAC OT "6 Clicks" Daily Activity     Outcome Measure Help from another person eating meals?: None Help from another person taking care of personal grooming?: A Little Help from another person toileting, which includes using toliet, bedpan, or urinal?: A Little Help from another person bathing (including washing, rinsing, drying)?: A Little Help from another person to put on and taking off regular upper body  clothing?: A Little Help from another person to put on and taking off regular lower body clothing?: A Little 6 Click Score: 19   End of Session Equipment Utilized During Treatment: Gait belt;Rolling walker (2 wheels)  Activity Tolerance: Patient tolerated treatment well Patient left: in chair;with call bell/phone within reach;with chair alarm set  OT Visit Diagnosis: Unsteadiness on feet (R26.81);Other abnormalities of gait and mobility (R26.89);Muscle weakness (generalized) (M62.81)                Time: 1345-1411 OT Time Calculation (min): 26 min Charges:  OT General Charges $OT Visit: 1 Visit OT Evaluation $OT Eval Moderate Complexity: 1 Mod OT Treatments $Self Care/Home Management : 8-22 mins  [place], OTR/L Acute [employer] Pager [phone] Office [phone]    Almon Register 12/10/2021, 2:20 PM

## 2021-12-10 NOTE — NC FL2 (Signed)
[place] LEVEL OF CARE SCREENING TOOL     IDENTIFICATION  Patient Name: Lauren [name] Birthdate: 1946-09-26 Sex: female Admission Date (Current Location): 12/08/2021  [facility] and [place] Number:  Herbalist and Address:  The . [facility], [place] [address] [place], [place] [address]      Provider Number: [id number]  Attending Physician Name and Address:  Lind Covert, MD  Relative Name and Phone Number:       Current Level of Care: Hospital Recommended Level of Care: [place] Prior Approval Number:    Date Approved/Denied:   PASRR Number:    Discharge Plan: SNF    Current Diagnoses: Patient Active Problem List   Diagnosis Date Noted   AMS (altered mental status) 12/08/2021   Physical deconditioning 09/07/2021   Lymphedema (chronic) 09/07/2021   Chronic anticoagulation 09/07/2021   Severe protein-calorie malnutrition ([place]) 09/07/2021   Anxiety with depression 09/07/2021   COVID-19 infection (asymptomatic-completed isolation) 09/07/2021   Abdominal pain    Septic shock ([place]) 07/18/2021   Sepsis ([place]) 07/18/2021   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   Cellulitis 07/18/2021   AKI (acute kidney injury) ([place]) 07/18/2021   Hyponatremia 07/18/2021   Colon carcinoma ([place])    Morbid obesity with BMI of 50.0-59.9, adult ([place]) 07/04/2021   Chronic combined systolic and diastolic CHF (congestive heart failure) ([place]) 07/04/2021   Constipation    Colonic mass    Iron deficiency anemia 06/29/2021   Elevated troponin 06/29/2021   Hypokalemia 06/29/2021   Rectal pain 06/29/2021   Chronic acquired lymphedema 02/17/2018   Essential hypertension with goal blood pressure less than 130/80 02/17/2018   Anxiety state 03/24/2014   Gallstone pancreatitis 03/23/2014   Acute cholecystitis 12/06/2012   Atrial fibrillation ([place]) 12/06/2012   Pancreatitis, acute 12/04/2012   Hypothyroidism 12/04/2012   Obesity (BMI  35.0-39.9 without comorbidity) 12/04/2012    Orientation RESPIRATION BLADDER Height & Weight     Self, Time, Situation, Place  Normal Incontinent Weight: 247 lb (112 kg) Height:  5\' 2"  (157.5 cm)  BEHAVIORAL SYMPTOMS/MOOD NEUROLOGICAL BOWEL NUTRITION STATUS      Continent Diet  AMBULATORY STATUS COMMUNICATION OF NEEDS Skin   Limited Assist Verbally Normal                       Personal Care Assistance Level of Assistance  Bathing, Feeding, Dressing Bathing Assistance: Limited assistance Feeding assistance: Limited assistance Dressing Assistance: Limited assistance     Functional Limitations Info             [place]  PT (By licensed PT), OT (By licensed OT)     PT Frequency: 5x/wk OT Frequency: 5x/wk            Contractures Contractures Info: Not present    Additional Factors Info  Code Status, Allergies, Psychotropic Code Status Info: Full Allergies Info: Codeine, Compazine (Prochlorperazine Edisylate), Prednisone, Tetanus Toxoids, Vicodin (Hydrocodone-acetaminophen) Psychotropic Info: Remeron 7.5mg  daily at bed         Current Medications (12/10/2021):  This is the current hospital active medication list Current Facility-Administered Medications  Medication Dose Route Frequency Provider Last Rate Last Admin   acetaminophen (TYLENOL) tablet 650 mg  650 mg Oral Q6H PRN Holley Bouche, MD       Or   acetaminophen (TYLENOL) suppository 650 mg  650 mg Rectal Q6H PRN Holley Bouche, MD       apixaban (ELIQUIS) tablet 5  mg  5 mg Oral BID Holley Bouche, MD   5 mg at 12/10/21 1115   levothyroxine (SYNTHROID) tablet 175 mcg  175 mcg Oral QAC breakfast Holley Bouche, MD   175 mcg at 12/10/21 3010   mirtazapine (REMERON) tablet 7.5 mg  7.5 mg Oral QHS Autry-Lott, Simone, DO   7.5 mg at 12/10/21 0249   sodium chloride flush (NS) 0.9 % injection 3 mL  3 mL Intravenous Once Holley Bouche, MD       torsemide [facility]) tablet 20 mg  20 mg  Oral Q1500 Holley Bouche, MD   20 mg at 12/09/21 1856     Discharge Medications: Please see discharge summary for a list of discharge medications.  Relevant Imaging Results:  Relevant Lab Results:   Additional Information SS#: [id number]  Geralynn Ochs, LCSW

## 2021-12-10 NOTE — Hospital Course (Addendum)
Lauren [name] is a 76 year old female who presented with altered mental status concerning for CVA.  Past medical history significant for A. fib on DOAC, colon cancer 3B, mixed systolic/diastolic CHF, lymphedema, hypothyroidism, hypertension, anxiety/depression  Encephalopathy   CVA rule out Came from SNF with altered mental status lethargy speech changes different from her baseline.  MRI brain and CT brain were obtained which came back negative.  Did not have any sources of infection did have a chest x-ray suggestive of lobar consolidation but did not have any fevers or shortness of breath.  Sedating medication such as Ativan possibility for difference in mental status.  We held to these medications during hospitalization. delirium a possibility as well.  EEG was obtained which showed no epileptiform discharges or seizures.  Cephalopathy resolved during hospitalization patient was alert and oriented x4.  A. fib on DOAC Was kept on her Eliquis 5 mg twice daily.  Metoprolol and amiodarone were held due to medication reconciliation.  Restarting both of these on discharge given patient's heart rate.  Chronic conditions remained stable

## 2022-01-01 ENCOUNTER — Ambulatory Visit: Payer: Medicare HMO | Admitting: Gastroenterology

## 2022-01-01 ENCOUNTER — Encounter: Payer: Self-pay | Admitting: Gastroenterology

## 2022-01-14 ENCOUNTER — Other Ambulatory Visit: Payer: Self-pay | Admitting: Cardiology

## 2022-01-14 ENCOUNTER — Telehealth: Payer: Self-pay | Admitting: Cardiology

## 2022-01-14 ENCOUNTER — Other Ambulatory Visit: Payer: Self-pay | Admitting: Gastroenterology

## 2022-01-14 NOTE — Telephone Encounter (Signed)
Phone number in pt's chart is invalid.  [place] spoke with pharmacist and he gave me a new number of [phone], which is not in service.  He states pt was recently discharged from a facility and she states her mom is sick on Hospice and does not want to make an follow-up appt at this time.  Advised we will call pt, the appt does not have to be tomorrow or even this month, but she needs a follow-up appt with Dr Harl Bowie scheduled to refill rx since she has not been seen in well over 1 year.  Called pt's brother, spoke to his wife Butch Penny got updated home phone number for pt [phone].  Will send new contact number to schedulers to call, pt states she will definitely schedule an appt so that we can keep refilling her rx. Advised I would send in a 30 day rx for Eliquis to her pharmacy as requested since she is almost out of medication.

## 2022-01-14 NOTE — Telephone Encounter (Signed)
°*  STAT* If patient is at the pharmacy, call can be transferred to refill team.   1. Which medications need to be refilled? (please list name of each medication and dose if known) ELIQUIS 5 MG TABS tablet  2. Which pharmacy/location (including street and city if local pharmacy) is medication to be sent to? [place], [place]  3. Do they need a 30 day or 90 day supply? 30 day

## 2022-01-14 NOTE — Telephone Encounter (Signed)
Pt last saw Dr Harl Bowie 03/10/20, pt is overdue for follow-up. Pt was due for 6 month follow-up 10/2020.  Looked at medication list and Eliquis has never been refilled by [place], only listed as a historical medication.  Sent message to schedulers in [place] to please call pt to get scheduled for OV to refill rx. Last labs 12/10/21 Creat 0.96, age 76, weight 112kg, based on specified criteria pt is on appropriate dosage of Eliquis.  Will await appt to refill Eliquis rx.

## 2022-01-14 NOTE — Telephone Encounter (Addendum)
Phone number in pt's chart is invalid.  [place] spoke with pharmacist and he gave me a new number of [phone], which is not in service.  He states pt was recently discharged from a facility and she states her mom is sick on Hospice and does not want to make an follow-up appt at this time.  Advised we will call pt, the appt does not have to be tomorrow or even this month, but she needs a follow-up appt with Dr Harl Bowie scheduled to refill rx since she has not been seen in well over 1 year.  Called pt's brother, spoke to his wife Butch Penny got updated home phone number for pt [phone].  Will send new contact number to schedulers to call, pt states she will definitely schedule an appt so that we can keep refilling her rx. Advised I would send in a 30 day rx for Eliquis to her pharmacy as requested since she is almost out of medication.   Eliquis has been sent to pharmacy in previous refill request for 30 days with no additional refills.  Will send message back to [facility] office with a note to call pt at new number to schedule follow-up appt with Dr Harl Bowie.

## 2022-01-15 ENCOUNTER — Telehealth: Payer: Self-pay | Admitting: Internal Medicine

## 2022-01-15 MED ORDER — APIXABAN 5 MG PO TABS: 5.0000 mg | ORAL_TABLET | Freq: Two times a day (BID) | ORAL | 2 refills | Status: DC

## 2022-01-15 MED ORDER — APIXABAN 5 MG PO TABS: 5.0000 mg | ORAL_TABLET | Freq: Two times a day (BID) | ORAL | 0 refills | Status: DC

## 2022-01-15 NOTE — Addendum Note (Signed)
Addended by: Malen Gauze on: 01/15/2022 07:32 AM   Modules accepted: Orders

## 2022-01-15 NOTE — Telephone Encounter (Signed)
Returned the pt's call LMOVM 

## 2022-01-15 NOTE — Telephone Encounter (Signed)
Appt was made with Dr Harl Bowie on 03/29/22.  2 additional Eliquis refills given to get pt to appt time.

## 2022-01-15 NOTE — Telephone Encounter (Signed)
Pt has a question about a prescription. [phone]

## 2022-01-16 ENCOUNTER — Telehealth: Payer: Self-pay | Admitting: Internal Medicine

## 2022-01-16 NOTE — Telephone Encounter (Signed)
See other phone note

## 2022-01-16 NOTE — Telephone Encounter (Signed)
Pt returning call. I told her the nurse left her a VM yesterday, but she didn't listen to it. Please call [phone]

## 2022-01-16 NOTE — Telephone Encounter (Signed)
Returned the pt's call and advised her we where not a PCP and we couldn't refilled her medications. She stated she was told here that we could I checked her chart and no one had a conversation with her regarding this. I advised her to call her old PCP's office

## 2022-02-25 ENCOUNTER — Other Ambulatory Visit: Payer: Self-pay | Admitting: Cardiology

## 2022-03-25 ENCOUNTER — Telehealth: Payer: Self-pay | Admitting: Cardiology

## 2022-03-25 NOTE — Telephone Encounter (Signed)
States she was in hospital & then to nursing home x several months.   This has caused some problems with her home.   ? ?Has a foreclosure hearing scheduled for Wednesday, 03/27/2022 & does not have transportation.  States court will not let her reschedule it without a note from her doctor.  ? ?Janeece Riggers of [place] - fax:  [phone].   ?

## 2022-03-25 NOTE — Telephone Encounter (Signed)
New Message: ? ? ? ? ?Patient says she is supposed to appear in court on 03-27-22(Wednesday). She needs a letter from Dr Harl Bowie faxed (to the court, stating that she is unable to appear on Wednesday. She says she is on a walker, have stomach problems. Please call her for furture details.  ?

## 2022-03-28 NOTE — Telephone Encounter (Signed)
Patient notified and verbalized understanding.  Stated that she does not have pcp currently.  Patient rescheduled OV from tomorrow to 05/03/2022.  Rescheduled due to lack of transportation.   ?

## 2022-03-28 NOTE — Telephone Encounter (Signed)
Would need to come from pcp, I have not evaluated her in quite some time ? ?J Anaisha Mago MD ?

## 2022-03-29 ENCOUNTER — Ambulatory Visit: Payer: Medicare HMO | Admitting: Cardiology

## 2022-04-25 ENCOUNTER — Ambulatory Visit: Payer: Medicare HMO | Admitting: Gastroenterology

## 2022-04-29 NOTE — Progress Notes (Deleted)
GI Office Note    Referring Provider: No ref. provider found Primary Care Physician:  Pcp, No  Primary GI: Dr. Abbey Chatters  Chief Complaint   No chief complaint on file.    History of Present Illness   Lauren [name] is a 76 y.o. female presenting today at the request of No ref. provider found for ***   Patient seen in the hospital August 2022.  She had recently developed fecal impaction evaluated outside hospital.  There she was noted to have anemia on iron studies confirming IDA and CT suggesting cecal mass.  She was recommended to have colonoscopy there but left AMA and decided to come to [facility].  She had denied abdominal pain, melena, BRBPR, NSAID use, peptic ulcer disease.  She had intentional weight loss at that time.  Stool guaiac negative.  Hgb was 7.8, iron studies with TIBC 414, ferritin 5.4, iron 16.  She underwent EGD and colonoscopy 07/05/2021.  EGD with small hiatal hernia, nonobstructing Schatzki's ring, normal stomach, normal duodenum.  Colonoscopy with nonbleeding internal hemorrhoids, diverticulosis in the sigmoid and descending colon, two 5 to 7 mm polyps in the transverse colon, likely malignant partially obstructing tumor in the ascending colon s/p biopsy, 3 polyps in the ascending colon.  She underwent right hemicolectomy on 07/06/2021.  Pathology review confirmed invasive, moderately differentiated adenocarcinoma, clear margins.  She developed postoperative ileus with nausea and vomiting.  Her IDA was treated with ferric gluconate infusions and she was discharged to SNF on p.o. iron.  After discharge she returned to the ED within 24 hours with abdominal pain, nausea and vomiting, sepsis and hypotension.  CT confirmed fistula extending region of suture line 10 umbilical hernia into the abdominal wall connected to overlying skin.  Fistula communicated with fluid collection abdominal wall with gastric and soft tissues and right back, axilla concerning for  necrotizing fasciitis.  Wound was opened up at bedside on 824 and transferred to [facility] for critical care management.  She had a Eakin's pouch placed to suction.  She was passing Mignano stool from rectum.  On 9/11 she developed hematochezia with Zumbro stool, IV heparin held, she was initiated on IV PPI and subsequently placed on daily PPI p.o. She was discharged to [facility] 09/07/21.   She was no show for 01/01/22 appointment and cancelled appointment for 04/25/22.   Most recent labs 04/12/22. Hgb 14.8, MCV 87.8, Cr 1.23, GFR 46, Alk phos 126, LFTs normal.    Today:  Rectal bleeding?  Melena? Rectal pain? Abdominal pain?   Past Medical History:  Diagnosis Date   Acute cholecystitis 12/06/2012   Chronic combined systolic and diastolic CHF (congestive heart failure) (HCC)    Colon cancer (HCC)    Diverticulosis    Hypotension    BP runs soft   Hypothyroidism    Lymphedema    Morbid obesity ([place])    Obesity    Pancreatitis, acute 32/95/1884   Periumbilical hernia    Permanent atrial fibrillation [facility])     Past Surgical History:  Procedure Laterality Date   BIOPSY  07/05/2021   Procedure: BIOPSY;  Surgeon: [name], DO;  [facility];  Service: Endoscopy;;   CHOLECYSTECTOMY N/A 03/25/2014   Procedure: LAPAROSCOPIC CHOLECYSTECTOMY WITH INTRAOPERATIVE CHOLANGIOGRAM;  Surgeon: Ralene Ok, MD;  Location: [place];  Service: General;  Laterality: N/A;   COLONOSCOPY WITH PROPOFOL N/A 07/05/2021   Procedure: COLONOSCOPY WITH PROPOFOL;  Surgeon: [name], DO;  [facility];  Service: Endoscopy;  Laterality: N/A;   ESOPHAGOGASTRODUODENOSCOPY (EGD) WITH PROPOFOL N/A 07/05/2021   Procedure: ESOPHAGOGASTRODUODENOSCOPY (EGD) WITH PROPOFOL;  Surgeon: [name], DO;  [facility];  Service: Endoscopy;  Laterality: N/A;   PARTIAL COLECTOMY N/A 07/06/2021   Procedure: RIGHT HEMICOLECTOMY;  Surgeon: [name] Signs, MD;  [facility]: [facility];  Service:  General;  Laterality: N/A;   POLYPECTOMY  07/05/2021   Procedure: POLYPECTOMY;  Surgeon: [name], DO;  [facility];  Service: Endoscopy;;    Current Outpatient Medications  Medication Sig Dispense Refill   acetaminophen (TYLENOL) 325 MG tablet Take 2 tablets (650 mg total) by mouth every 6 (six) hours as needed for mild pain. (Patient not taking: Reported on 12/08/2021)     amiodarone (PACERONE) 200 MG tablet TAKE 1 TABLET DAILY 30 tablet 1   apixaban (ELIQUIS) 5 MG TABS tablet Take 1 tablet (5 mg total) by mouth 2 (two) times daily. Please cancel previous refill order.  Sent to wrong pharmacy. 60 tablet 0   cetirizine (ZYRTEC) 10 MG tablet Take 10 mg by mouth daily as needed (for itching).     Cholecalciferol (VITAMIN D3) 25 MCG (1000 UT) CAPS Take 1,000 Units by mouth in the morning.     feeding supplement (ENSURE ENLIVE / ENSURE PLUS) LIQD Take 237 mLs by mouth 3 (three) times daily between meals. (Patient not taking: Reported on 12/08/2021) 237 mL 12   ferrous sulfate 325 (65 FE) MG EC tablet Take 1 tablet (325 mg total) by mouth 2 (two) times daily. (Patient not taking: Reported on 12/08/2021) 60 tablet 3   ipratropium-albuterol (DUONEB) 0.5-2.5 (3) MG/3ML SOLN Take 3 mLs by nebulization every 2 (two) hours as needed. (Patient not taking: Reported on 12/08/2021) 360 mL    levothyroxine (SYNTHROID) 175 MCG tablet Take 175 mcg by mouth daily before breakfast.     liver oil-zinc oxide (DESITIN) 40 % ointment Apply topically as needed for irritation. (Patient not taking: Reported on 12/08/2021) 56.7 g 0   metoprolol tartrate (LOPRESSOR) 25 MG tablet TAKE 1/2 TABLET 2 TIMES A DAY 30 tablet 1   mirtazapine (REMERON) 7.5 MG tablet Take 1 tablet (7.5 mg total) by mouth at bedtime.     Multiple Vitamin (MULTIVITAMIN WITH MINERALS) TABS tablet Take 1 tablet by mouth daily. (Patient not taking: Reported on 12/08/2021)     OXYGEN Inhale 2 L/min into the lungs as needed (for shortness of  breath).     pantoprazole (PROTONIX) 40 MG tablet TAKE 1 TABLET 2 TIMES A DAY 180 tablet 3   potassium chloride SA (KLOR-CON M) 20 MEQ tablet TAKE 1 TABLET DAILY 30 tablet 1   torsemide (DEMADEX) 20 MG tablet TAKE 1 TABLET ONCE A DAY 30 tablet 1   No current facility-administered medications for this visit.    Allergies as of 04/30/2022 - Review Complete 12/08/2021  Allergen Reaction Noted   Codeine Itching and Other (See Comments) 10/10/2012   Compazine [prochlorperazine edisylate] Other (See Comments) 10/10/2012   Prednisone Nausea And Vomiting and Other (See Comments) 12/04/2012   Tetanus toxoids Other (See Comments) 07/06/2018   Vicodin [hydrocodone-acetaminophen] Itching and Other (See Comments) 03/23/2014    Family History  Problem Relation Age of Onset   Cancer Father        liver   Hypertension Brother    Heart disease Maternal Grandmother    Kidney disease Maternal Grandfather    Diabetes Maternal Grandfather    Kidney failure Brother  Social History   Socioeconomic History   Marital status: Married    Spouse name: Not on file   Number of children: Not on file   Years of education: Not on file   Highest education level: Not on file  Occupational History   Not on file  Tobacco Use   Smoking status: Never   Smokeless tobacco: Never  Vaping Use   Vaping Use: Never used  Substance and Sexual Activity   Alcohol use: No   Drug use: No   Sexual activity: Not Currently  Other Topics Concern   Not on file  Social History Narrative   Not on file   Social Determinants of Health   Financial Resource Strain: Not on file  Food Insecurity: Not on file  Transportation Needs: Not on file  Physical Activity: Not on file  Stress: Not on file  Social Connections: Not on file  Intimate Partner Violence: Not on file     Review of Systems   Gen: Denies any fever, chills, fatigue, weight loss, lack of appetite.  CV: Denies chest pain, heart palpitations,  peripheral edema, syncope.  Resp: Denies shortness of breath at rest or with exertion. Denies wheezing or cough.  GI: see HPI GU : Denies urinary burning, urinary frequency, urinary hesitancy MS: Denies joint pain, muscle weakness, cramps, or limitation of movement.  Derm: Denies rash, itching, dry skin Psych: Denies depression, anxiety, memory loss, and confusion Heme: Denies bruising, bleeding, and enlarged lymph nodes.   Physical Exam   LMP 09/13/2016   General:   Alert and oriented. Pleasant and cooperative. Well-nourished and well-developed.  Head:  Normocephalic and atraumatic. Eyes:  Without icterus, sclera clear and conjunctiva pink.  Ears:  Normal auditory acuity. Mouth:  No deformity or lesions, oral mucosa pink.  Lungs:  Clear to auscultation bilaterally. No wheezes, rales, or rhonchi. No distress.  Heart:  S1, S2 present without murmurs appreciated.  Abdomen:  +BS, soft, non-tender and non-distended. No HSM noted. No guarding or rebound. No masses appreciated.  Rectal:  Deferred  Msk:  Symmetrical without gross deformities. Normal posture. Extremities:  Without edema. Neurologic:  Alert and  oriented x4;  grossly normal neurologically. Skin:  Intact without significant lesions or rashes. Psych:  Alert and cooperative. Normal mood and affect.   Assessment   Lauren [name] is a 76 y.o. female with a history of systolic and diastolic CHF, lymphedema, hypothyroidism, pancreatitis in 2014, A-fib, and colon cancer s/p right hemicolectomy August 3762 complicated by colocutaneous fistula with Eakin's pouch*** presenting today with ***  History of cecal adenocarcinoma: Underwent right hemicolectomy August 8315 complicated by colocutaneous fistula and sepsis requiring admission to the hospital for 2 months.  PLAN   ***    Venetia Night, MSN, FNP-BC, AGACNP-BC [facility]

## 2022-04-30 ENCOUNTER — Ambulatory Visit: Payer: Medicare HMO | Admitting: Gastroenterology

## 2022-05-03 ENCOUNTER — Ambulatory Visit: Payer: Medicare HMO | Admitting: Cardiology

## 2022-05-03 NOTE — Progress Notes (Deleted)
Clinical Summary Lauren [name] is a 76 y.o.female  seen today for follow up of the following medical problems.      1. Chronic diastolic heart failure - admission 08/2016 with acute CHF - diuresed up to 3 liters during admission. - echo 08/2016 showed LVEF 50-55%, mild MR,      - weights stable around 270-277 lbs. Takes toresemide '20mg'$  daily.      2. Lymphedema - was improving with lymphedema clinic treatments - was not able to complete treatments due to COVID-19 and recent passing of her husband   3. Afib - no recent palpitations.  - fatigue on lopressor. Reports some fatigue on current dilt as well.        4. HTN - she is compliant with meds         SH: husband passed in 12/2018 Does not want to get covid vaccine.    Past Medical History:  Diagnosis Date   Acute cholecystitis 12/06/2012   Chronic combined systolic and diastolic CHF (congestive heart failure) (HCC)    Colon cancer (HCC)    Diverticulosis    Hypotension    BP runs soft   Hypothyroidism    Lymphedema    Morbid obesity ([place])    Obesity    Pancreatitis, acute 54/65/6812   Periumbilical hernia    Permanent atrial fibrillation (HCC)      Allergies  Allergen Reactions   Codeine Itching and Other (See Comments)    "Allergic," per [facility]   Compazine [Prochlorperazine Edisylate] Other (See Comments)    "Allergic," per [facility]   Prednisone Nausea And Vomiting and Other (See Comments)    "Allergic," per [facility]   Tetanus Toxoids Other (See Comments)    Knot like "hen's egg" came up and "Allergic," per [facility]   Vicodin [Hydrocodone-Acetaminophen] Itching and Other (See Comments)    "Allergic," per [facility]     Current Outpatient Medications  Medication Sig Dispense Refill   acetaminophen (TYLENOL) 325 MG tablet Take 2 tablets (650 mg total) by mouth every 6 (six) hours as needed for mild pain. (Patient not taking: Reported on 12/08/2021)     amiodarone (PACERONE) 200 MG tablet TAKE 1 TABLET DAILY 30 tablet 1    apixaban (ELIQUIS) 5 MG TABS tablet Take 1 tablet (5 mg total) by mouth 2 (two) times daily. Please cancel previous refill order.  Sent to wrong pharmacy. 60 tablet 0   cetirizine (ZYRTEC) 10 MG tablet Take 10 mg by mouth daily as needed (for itching).     Cholecalciferol (VITAMIN D3) 25 MCG (1000 UT) CAPS Take 1,000 Units by mouth in the morning.     feeding supplement (ENSURE ENLIVE / ENSURE PLUS) LIQD Take 237 mLs by mouth 3 (three) times daily between meals. (Patient not taking: Reported on 12/08/2021) 237 mL 12   ferrous sulfate 325 (65 FE) MG EC tablet Take 1 tablet (325 mg total) by mouth 2 (two) times daily. (Patient not taking: Reported on 12/08/2021) 60 tablet 3   ipratropium-albuterol (DUONEB) 0.5-2.5 (3) MG/3ML SOLN Take 3 mLs by nebulization every 2 (two) hours as needed. (Patient not taking: Reported on 12/08/2021) 360 mL    levothyroxine (SYNTHROID) 175 MCG tablet Take 175 mcg by mouth daily before breakfast.     liver oil-zinc oxide (DESITIN) 40 % ointment Apply topically as needed for irritation. (Patient not taking: Reported on 12/08/2021) 56.7 g 0   metoprolol tartrate (LOPRESSOR) 25 MG tablet TAKE 1/2 TABLET 2 TIMES A DAY 30  tablet 1   mirtazapine (REMERON) 7.5 MG tablet Take 1 tablet (7.5 mg total) by mouth at bedtime.     Multiple Vitamin (MULTIVITAMIN WITH MINERALS) TABS tablet Take 1 tablet by mouth daily. (Patient not taking: Reported on 12/08/2021)     OXYGEN Inhale 2 L/min into the lungs as needed (for shortness of breath).     pantoprazole (PROTONIX) 40 MG tablet TAKE 1 TABLET 2 TIMES A DAY 180 tablet 3   potassium chloride SA (KLOR-CON M) 20 MEQ tablet TAKE 1 TABLET DAILY 30 tablet 1   torsemide (DEMADEX) 20 MG tablet TAKE 1 TABLET ONCE A DAY 30 tablet 1   No current facility-administered medications for this visit.     Past Surgical History:  Procedure Laterality Date   BIOPSY  07/05/2021   Procedure: BIOPSY;  Surgeon: [name], DO;  [facility];   Service: Endoscopy;;   CHOLECYSTECTOMY N/A 03/25/2014   Procedure: LAPAROSCOPIC CHOLECYSTECTOMY WITH INTRAOPERATIVE CHOLANGIOGRAM;  Surgeon: Ralene Ok, MD;  Location: [place];  Service: General;  Laterality: N/A;   COLONOSCOPY WITH PROPOFOL N/A 07/05/2021   Procedure: COLONOSCOPY WITH PROPOFOL;  Surgeon: [name], DO;  [facility];  Service: Endoscopy;  Laterality: N/A;   ESOPHAGOGASTRODUODENOSCOPY (EGD) WITH PROPOFOL N/A 07/05/2021   Procedure: ESOPHAGOGASTRODUODENOSCOPY (EGD) WITH PROPOFOL;  Surgeon: [name], DO;  [facility];  Service: Endoscopy;  Laterality: N/A;   PARTIAL COLECTOMY N/A 07/06/2021   Procedure: RIGHT HEMICOLECTOMY;  Surgeon: [name] Signs, MD;  [facility];  Service: General;  Laterality: N/A;   POLYPECTOMY  07/05/2021   Procedure: POLYPECTOMY;  Surgeon: [name], DO;  [facility];  Service: Endoscopy;;     Allergies  Allergen Reactions   Codeine Itching and Other (See Comments)    "Allergic," per [facility]   Compazine [Prochlorperazine Edisylate] Other (See Comments)    "Allergic," per [facility]   Prednisone Nausea And Vomiting and Other (See Comments)    "Allergic," per [facility]   Tetanus Toxoids Other (See Comments)    Knot like "hen's egg" came up and "Allergic," per [facility]   Vicodin [Hydrocodone-Acetaminophen] Itching and Other (See Comments)    "Allergic," per [facility]      Family History  Problem Relation Age of Onset   Cancer Father        liver   Hypertension Brother    Heart disease Maternal Grandmother    Kidney disease Maternal Grandfather    Diabetes Maternal Grandfather    Kidney failure Brother      Social History Lauren [name] reports that she has never smoked. She has never used smokeless tobacco. Lauren [name] reports no history of alcohol use.   Review of Systems CONSTITUTIONAL: No weight loss, fever, chills, weakness or fatigue.  HEENT: Eyes: No visual loss, blurred vision, double vision or  yellow sclerae.No hearing loss, sneezing, congestion, runny nose or sore throat.  SKIN: No rash or itching.  CARDIOVASCULAR:  RESPIRATORY: No shortness of breath, cough or sputum.  GASTROINTESTINAL: No anorexia, nausea, vomiting or diarrhea. No abdominal pain or blood.  GENITOURINARY: No burning on urination, no polyuria NEUROLOGICAL: No headache, dizziness, syncope, paralysis, ataxia, numbness or tingling in the extremities. No change in bowel or bladder control.  MUSCULOSKELETAL: No muscle, back pain, joint pain or stiffness.  LYMPHATICS: No enlarged nodes. No history of splenectomy.  PSYCHIATRIC: No history of depression or anxiety.  ENDOCRINOLOGIC: No reports of sweating, cold or heat intolerance. No polyuria or  polydipsia.  Marland Kitchen   Physical Examination There were no vitals filed for this visit. There were no vitals filed for this visit.  Gen: resting comfortably, no acute distress HEENT: no scleral icterus, pupils equal round and reactive, no palptable cervical adenopathy,  CV Resp: Clear to auscultation bilaterally GI: abdomen is soft, non-tender, non-distended, normal bowel sounds, no hepatosplenomegaly MSK: extremities are warm, no edema.  Skin: warm, no rash Neuro:  no focal deficits Psych: appropriate affect   Diagnostic Studies     Assessment and Plan     1. Chronic diastolic HF - some increased edmea per report. Torsemide '40mg'$  daily was too much, had orthostatic symptoms. '20mg'$  daily appears to be not quite enough - continue torsemide   2. Lymphedema - had to stop lymphedema clinic due to passing of her husband and then limitations by COVID-19 - reestablish at lymphedema clinic when risk has lowered   3. Afib - low bps and fatigue when on lopressor. - she wants to stop ditliazem due to side effects, feels like heart rates are ok - hold diltiazem, plan for 24 hr monitor.    4. HTN -she is at goal, continue current meds.      Arnoldo Lenis, M.D.,  F.A.C.C.

## 2022-08-13 IMAGING — DX DG CHEST 1V PORT
1 series · 1 of 1 positions shown · non-contrast
Comparison: 07/18/2021

CLINICAL DATA: PICC line placement

EXAM:
PORTABLE CHEST 1 VIEW

[chest]
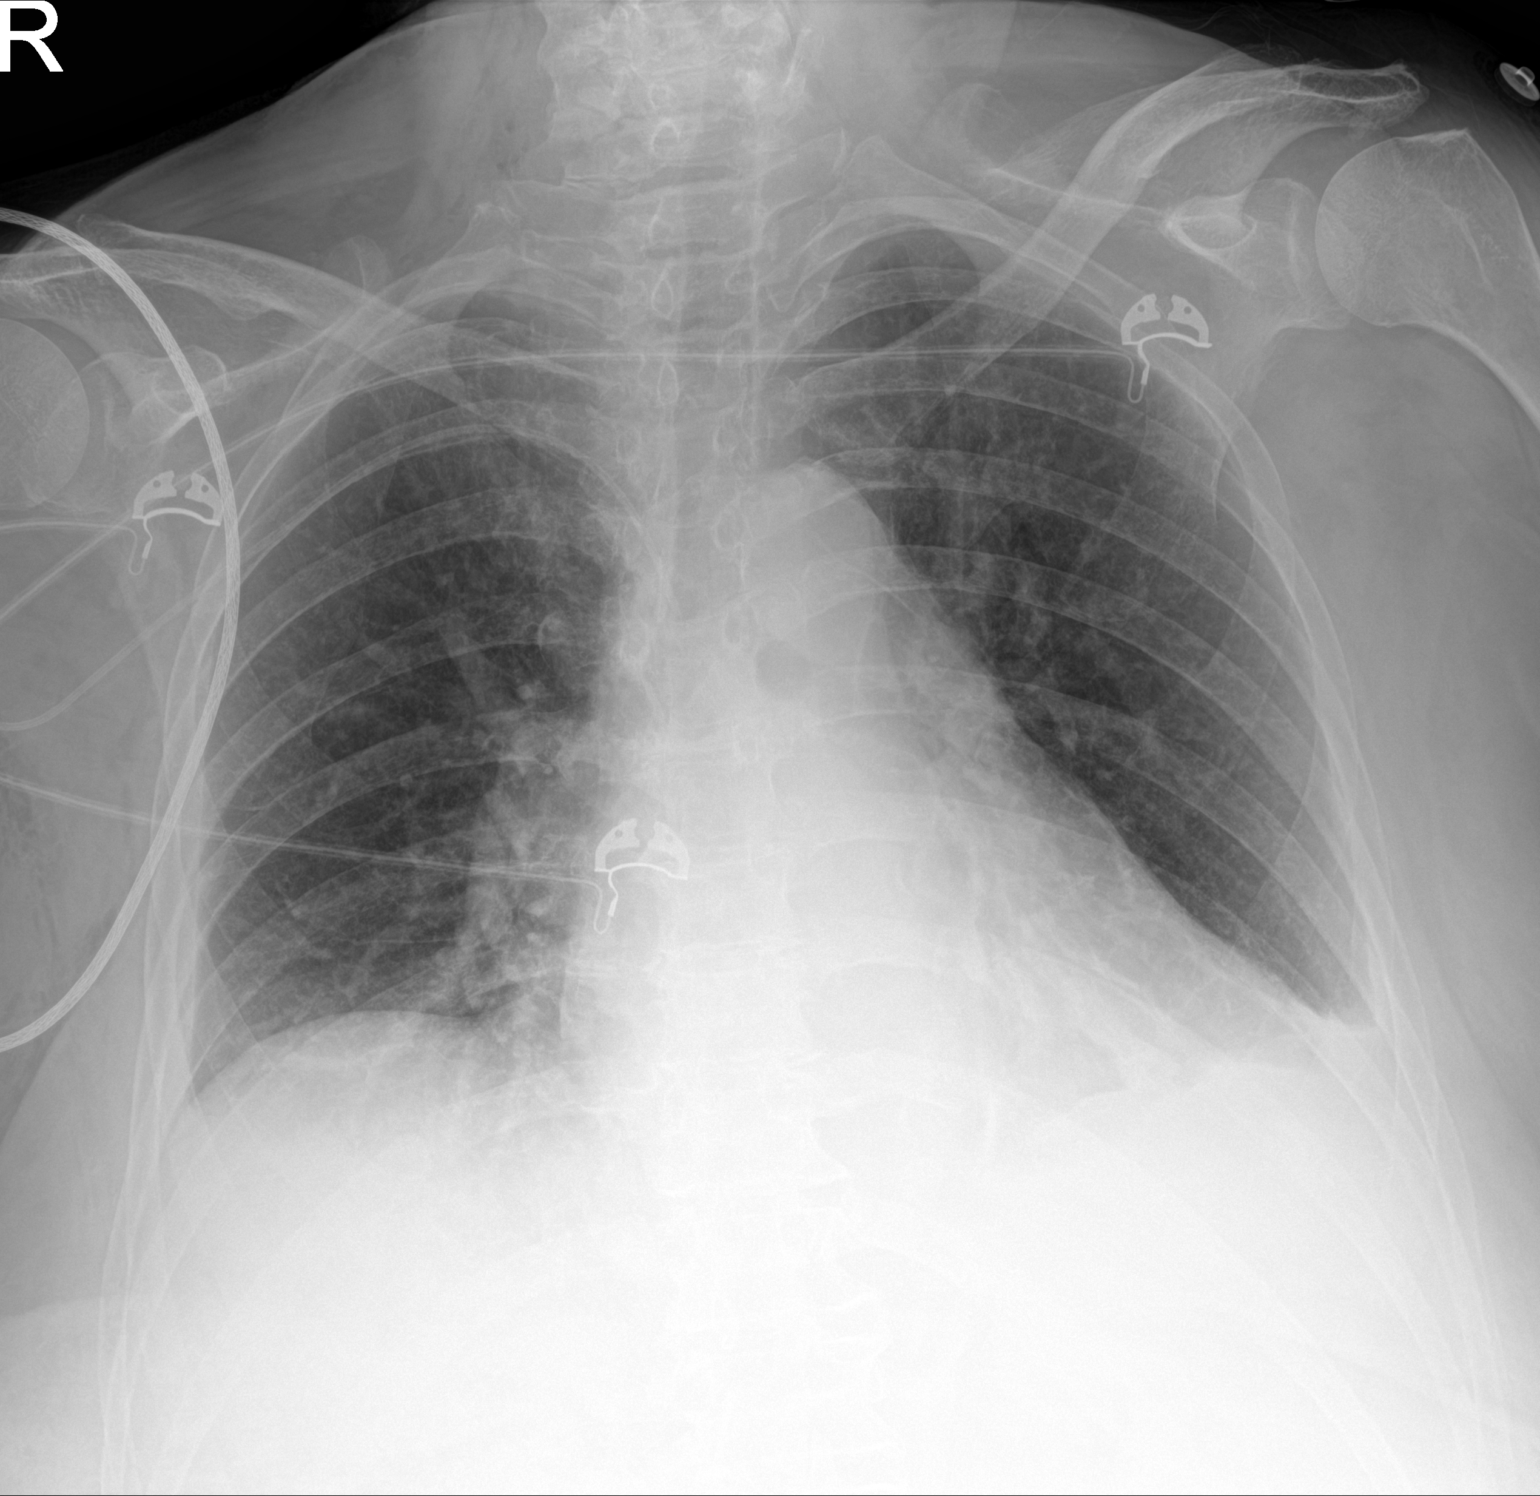

[1 of 1 positions shown; findings below may reference images not displayed]

FINDINGS: Right upper extremity PICC line is been placed with its tip
overlying the expected superior vena cava. Lung volumes are small.
Small left pleural effusion is stable. No pneumothorax. Cardiac size
is mildly enlarged, unchanged. Pulmonary vascularity is normal.
IMPRESSION: Right upper extremity PICC line tip within the superior vena cava.

Small left pleural effusion, unchanged.

## 2022-08-13 IMAGING — DX DG CHEST 1V PORT
1 series · 1 of 1 positions shown · non-contrast
Comparison: July 18, 2021.

CLINICAL DATA: PICC line placement.

EXAM:
PORTABLE CHEST 1 VIEW

[chest ap]
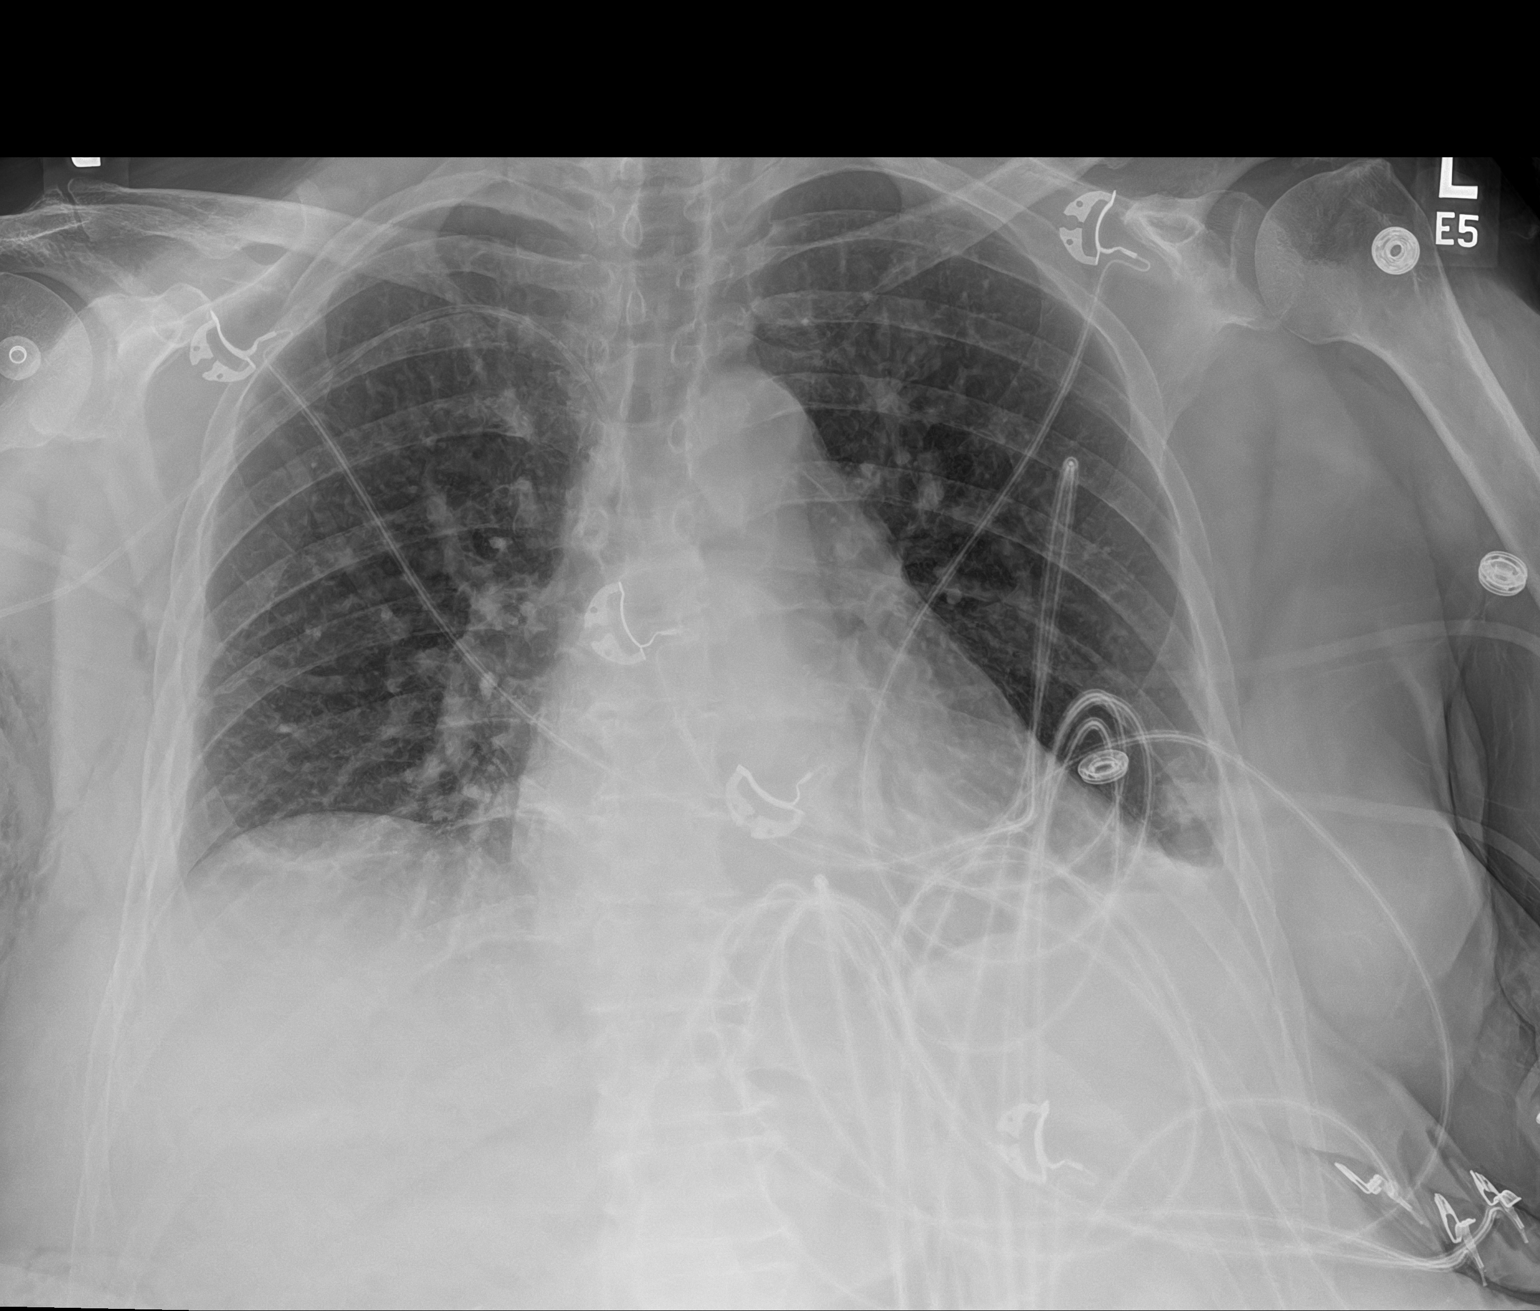

[1 of 1 positions shown; findings below may reference images not displayed]

FINDINGS: Stable cardiomediastinal silhouette. Interval placement of
right-sided PICC line with distal tip in expected position of the
SVC. Mild bibasilar subsegmental atelectasis is noted with small
pleural effusions. Bony thorax is unremarkable.
IMPRESSION: Interval placement of right-sided PICC line with distal tip in
expected position of the SVC. Mild bibasilar subsegmental
atelectasis with small pleural effusions.

## 2022-08-13 IMAGING — DX DG CHEST 1V PORT
1 series · 1 of 1 positions shown · non-contrast
Comparison: Multiple chest radiographs, most recently 07/12/2021.
KUB, 07/12/2021.

CLINICAL DATA: Question sepsis

EXAM:
PORTABLE CHEST 1 VIEW

[chest ap]
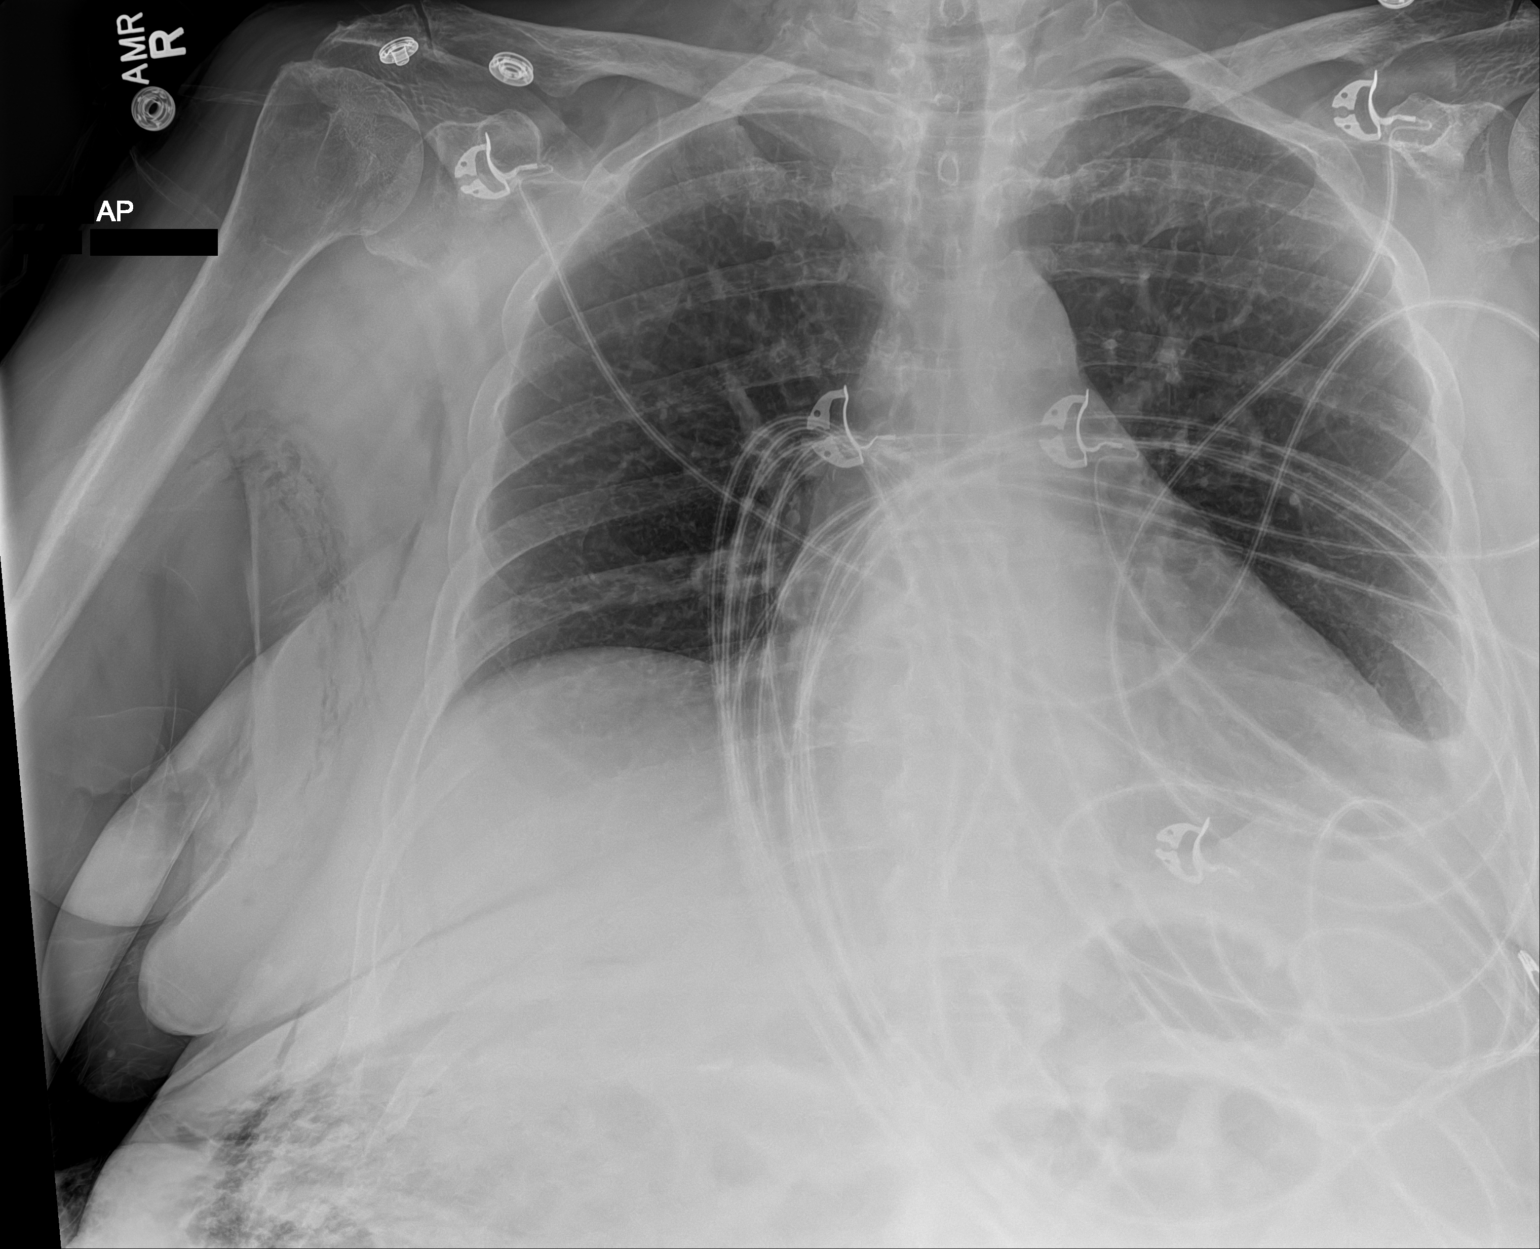

[1 of 1 positions shown; findings below may reference images not displayed]

FINDINGS: Support lines: Interval removal of RIGHT upper extremity PICC.
Overlying support needs.

Cardiac silhouette enlargement, unchanged. Low lung volumes. The
RIGHT lung is clear. LEFT basilar consolidation obscuring the
cardiac apex, unchanged. No pleural effusion or pneumothorax.

Appearance of subcutaneous gas tracking along the RIGHT chest wall,
axilla and imaged RIGHT upper quadrant. No interval osseous
abnormality.
IMPRESSION: 1. Interval removal RIGHT upper extremity PICC
2. LEFT basilar atelectasis, unchanged.
3. Overlying density with appearance of tracking subcutaneous gas
along the RIGHT axilla and upper quadrant. Clinical correlation is
recommended.

## 2022-08-14 IMAGING — DX DG CHEST 1V PORT
1 series · 1 of 1 positions shown · non-contrast
Comparison: Radiograph earlier today.

CLINICAL DATA: PICC placement.

EXAM:
PORTABLE CHEST 1 VIEW

[chest ap]
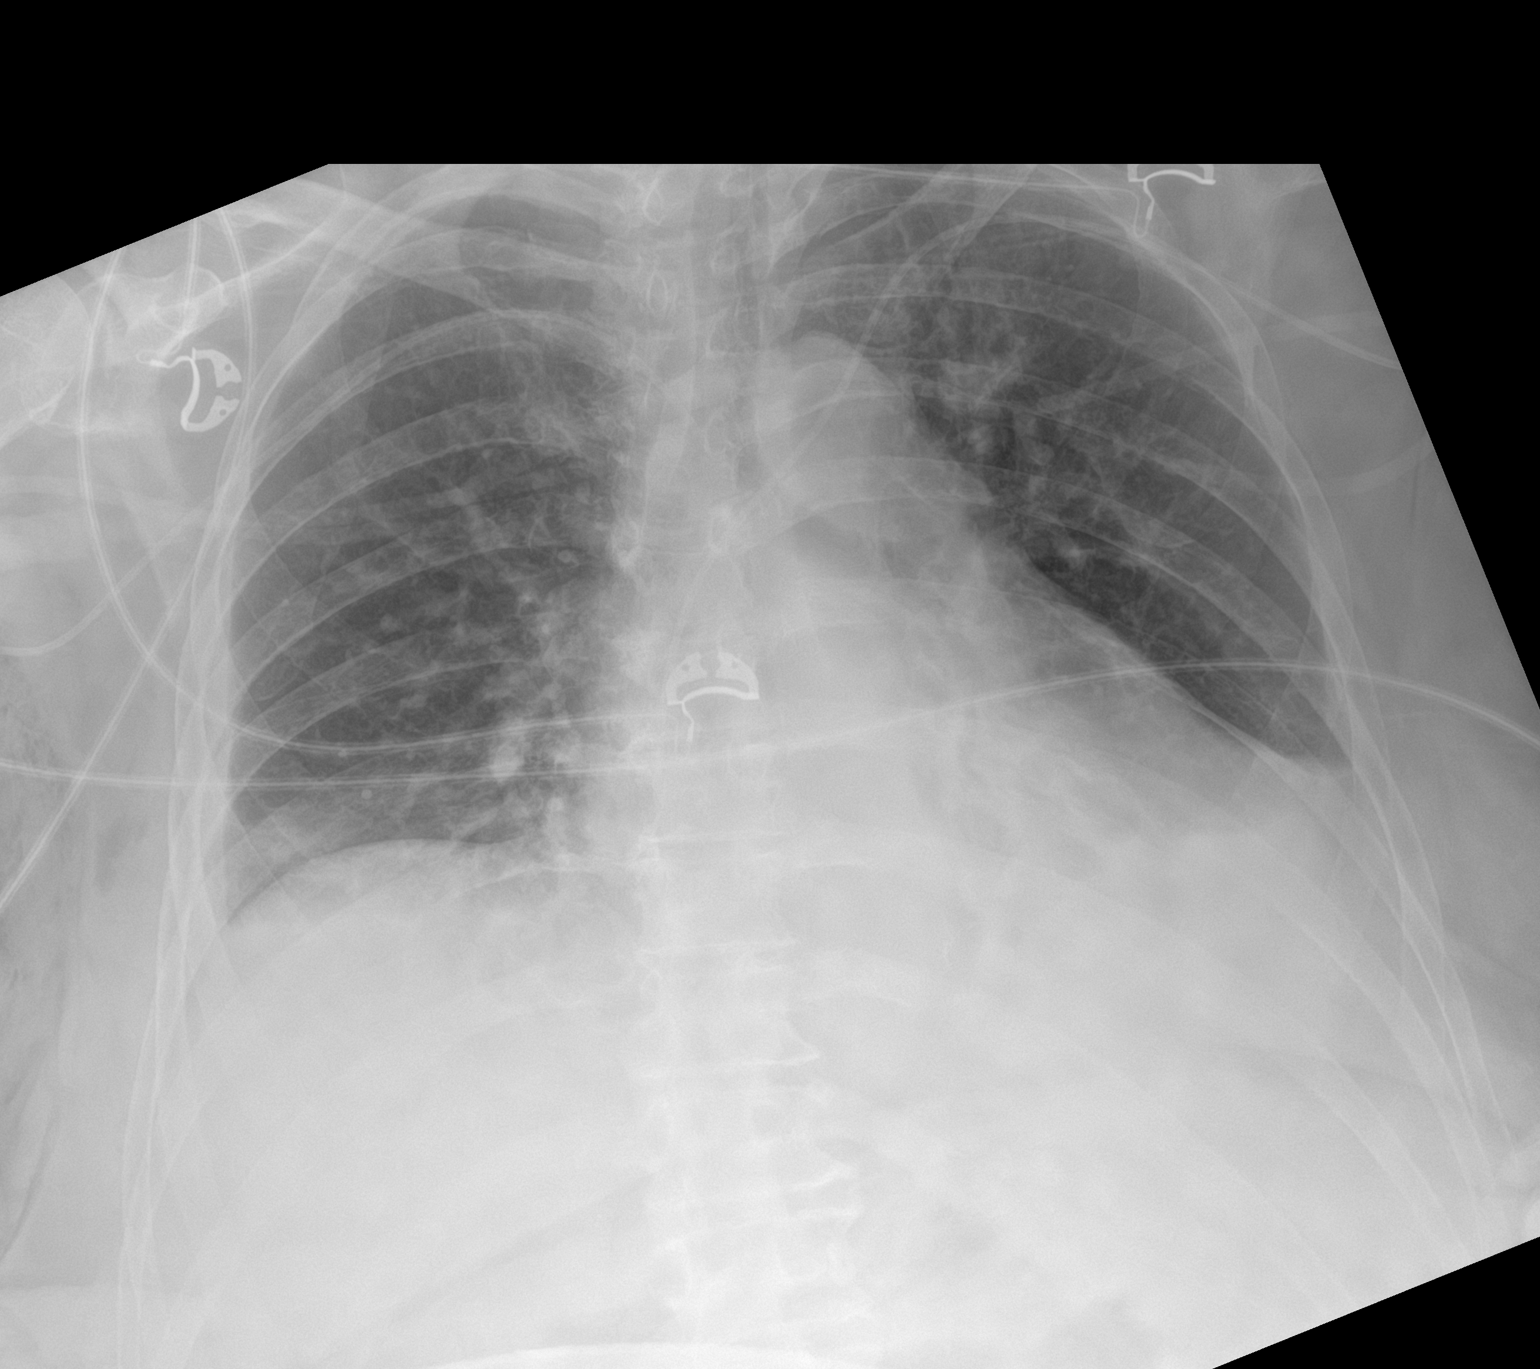

[1 of 1 positions shown; findings below may reference images not displayed]

FINDINGS: Patient is rotated. The previous right upper extremity PICC tip is
in the region of the distal brachiocephalic vein/SVC confluence.
There is a new left upper extremity PICC with tip overlying the
region of the mid SVC. No pneumothorax unchanged cardiomegaly.
Irregular left basilar opacities are similar. There is increasing
streaky right basilar atelectasis. No visualized pneumothorax. Soft
tissue emphysema noted in the right axilla, also seen on
abdominopelvic CT 07/18/2021.
IMPRESSION: 1. Tip of the new left upper extremity PICC in the mid SVC. Previous
right upper extremity PICC tip in the region of the brachiocephalic
SVC confluence.
2. Unchanged cardiomegaly and irregular left basilar opacities.
Increasing streaky right basilar atelectasis.
3. Soft tissue emphysema in the right axilla, also seen on
abdominopelvic CT 07/18/2021.

## 2022-12-06 ENCOUNTER — Observation Stay (HOSPITAL_COMMUNITY): Payer: Medicare HMO

## 2022-12-06 ENCOUNTER — Encounter (HOSPITAL_COMMUNITY): Payer: Self-pay | Admitting: Emergency Medicine

## 2022-12-06 ENCOUNTER — Other Ambulatory Visit: Payer: Self-pay

## 2022-12-06 ENCOUNTER — Emergency Department (HOSPITAL_COMMUNITY): Payer: Medicare HMO

## 2022-12-06 ENCOUNTER — Inpatient Hospital Stay (HOSPITAL_COMMUNITY)
Admission: EM | Admit: 2022-12-06 | Discharge: 2022-12-09 | DRG: 065 | Disposition: A | Discharge status: Skilled Nursing Facility | Payer: Medicare HMO | Source: Skilled Nursing Facility | Attending: Internal Medicine | Admitting: Internal Medicine

## 2022-12-06 DIAGNOSIS — Z6841 Body Mass Index (BMI) 40.0 and over, adult: Secondary | ICD-10-CM | POA: Diagnosis not present

## 2022-12-06 DIAGNOSIS — H53462 Homonymous bilateral field defects, left side: Secondary | ICD-10-CM | POA: Diagnosis present

## 2022-12-06 DIAGNOSIS — E039 Hypothyroidism, unspecified: Secondary | ICD-10-CM | POA: Diagnosis present

## 2022-12-06 DIAGNOSIS — Z888 Allergy status to other drugs, medicaments and biological substances status: Secondary | ICD-10-CM

## 2022-12-06 DIAGNOSIS — R29810 Facial weakness: Secondary | ICD-10-CM | POA: Diagnosis present

## 2022-12-06 DIAGNOSIS — F411 Generalized anxiety disorder: Secondary | ICD-10-CM | POA: Diagnosis present

## 2022-12-06 DIAGNOSIS — C189 Malignant neoplasm of colon, unspecified: Secondary | ICD-10-CM | POA: Diagnosis present

## 2022-12-06 DIAGNOSIS — K632 Fistula of intestine: Secondary | ICD-10-CM | POA: Diagnosis present

## 2022-12-06 DIAGNOSIS — Z9049 Acquired absence of other specified parts of digestive tract: Secondary | ICD-10-CM

## 2022-12-06 DIAGNOSIS — F32A Depression, unspecified: Secondary | ICD-10-CM | POA: Diagnosis present

## 2022-12-06 DIAGNOSIS — Z79899 Other long term (current) drug therapy: Secondary | ICD-10-CM

## 2022-12-06 DIAGNOSIS — I5042 Chronic combined systolic (congestive) and diastolic (congestive) heart failure: Secondary | ICD-10-CM | POA: Diagnosis present

## 2022-12-06 DIAGNOSIS — I639 Cerebral infarction, unspecified: Secondary | ICD-10-CM | POA: Diagnosis present

## 2022-12-06 DIAGNOSIS — I10 Essential (primary) hypertension: Secondary | ICD-10-CM | POA: Diagnosis present

## 2022-12-06 DIAGNOSIS — I6389 Other cerebral infarction: Secondary | ICD-10-CM

## 2022-12-06 DIAGNOSIS — Z885 Allergy status to narcotic agent status: Secondary | ICD-10-CM

## 2022-12-06 DIAGNOSIS — Z7189 Other specified counseling: Secondary | ICD-10-CM | POA: Diagnosis not present

## 2022-12-06 DIAGNOSIS — I110 Hypertensive heart disease with heart failure: Secondary | ICD-10-CM | POA: Diagnosis present

## 2022-12-06 DIAGNOSIS — Z79890 Hormone replacement therapy (postmenopausal): Secondary | ICD-10-CM | POA: Diagnosis not present

## 2022-12-06 DIAGNOSIS — Z7901 Long term (current) use of anticoagulants: Secondary | ICD-10-CM | POA: Diagnosis not present

## 2022-12-06 DIAGNOSIS — I4821 Permanent atrial fibrillation: Secondary | ICD-10-CM | POA: Diagnosis present

## 2022-12-06 DIAGNOSIS — I890 Lymphedema, not elsewhere classified: Secondary | ICD-10-CM

## 2022-12-06 DIAGNOSIS — R29720 NIHSS score 20: Secondary | ICD-10-CM | POA: Diagnosis present

## 2022-12-06 DIAGNOSIS — Z887 Allergy status to serum and vaccine status: Secondary | ICD-10-CM

## 2022-12-06 DIAGNOSIS — G40909 Epilepsy, unspecified, not intractable, without status epilepticus: Secondary | ICD-10-CM | POA: Diagnosis present

## 2022-12-06 DIAGNOSIS — Z993 Dependence on wheelchair: Secondary | ICD-10-CM

## 2022-12-06 DIAGNOSIS — I63311 Cerebral infarction due to thrombosis of right middle cerebral artery: Principal | ICD-10-CM | POA: Diagnosis present

## 2022-12-06 DIAGNOSIS — Z515 Encounter for palliative care: Secondary | ICD-10-CM

## 2022-12-06 DIAGNOSIS — Z8249 Family history of ischemic heart disease and other diseases of the circulatory system: Secondary | ICD-10-CM

## 2022-12-06 DIAGNOSIS — R4781 Slurred speech: Secondary | ICD-10-CM | POA: Diagnosis present

## 2022-12-06 DIAGNOSIS — I69354 Hemiplegia and hemiparesis following cerebral infarction affecting left non-dominant side: Secondary | ICD-10-CM | POA: Diagnosis not present

## 2022-12-06 DIAGNOSIS — Z85038 Personal history of other malignant neoplasm of large intestine: Secondary | ICD-10-CM

## 2022-12-06 DIAGNOSIS — R5381 Other malaise: Secondary | ICD-10-CM | POA: Diagnosis not present

## 2022-12-06 DIAGNOSIS — Z8719 Personal history of other diseases of the digestive system: Secondary | ICD-10-CM

## 2022-12-06 DIAGNOSIS — I4891 Unspecified atrial fibrillation: Secondary | ICD-10-CM | POA: Diagnosis present

## 2022-12-06 LAB — CBC
HCT: 44.9 % (ref 36.0–46.0)
Hemoglobin: 14.1 g/dL (ref 12.0–15.0)
MCH: 28.6 pg (ref 26.0–34.0)
MCHC: 31.4 g/dL (ref 30.0–36.0)
MCV: 91.1 fL (ref 80.0–100.0)
Platelets: 216 10*3/uL (ref 150–400)
RBC: 4.93 MIL/uL (ref 3.87–5.11)
RDW: 14.2 % (ref 11.5–15.5)
WBC: 7.9 10*3/uL (ref 4.0–10.5)
nRBC: 0 % (ref 0.0–0.2)

## 2022-12-06 LAB — ECHOCARDIOGRAM COMPLETE
AR max vel: 2.37 cm2
AV Area VTI: 2.36 cm2
AV Area mean vel: 2.35 cm2
AV Mean grad: 4.3 mmHg
AV Peak grad: 8.9 mmHg
Ao pk vel: 1.49 m/s
Area-P 1/2: 2.47 cm2
MV VTI: 1.76 cm2
S' Lateral: 3 cm
Weight: 3920 oz

## 2022-12-06 LAB — PROTIME-INR
INR: 1.1 (ref 0.8–1.2)
Prothrombin Time: 13.6 seconds (ref 11.4–15.2)

## 2022-12-06 LAB — DIFFERENTIAL
Abs Immature Granulocytes: 0.03 10*3/uL (ref 0.00–0.07)
Basophils Absolute: 0.1 10*3/uL (ref 0.0–0.1)
Basophils Relative: 1 %
Eosinophils Absolute: 0.3 10*3/uL (ref 0.0–0.5)
Eosinophils Relative: 3 %
Immature Granulocytes: 0 %
Lymphocytes Relative: 37 %
Lymphs Abs: 2.9 10*3/uL (ref 0.7–4.0)
Monocytes Absolute: 0.6 10*3/uL (ref 0.1–1.0)
Monocytes Relative: 8 %
Neutro Abs: 4 10*3/uL (ref 1.7–7.7)
Neutrophils Relative %: 51 %

## 2022-12-06 LAB — CBG MONITORING, ED: Glucose-Capillary: 118 mg/dL — ABNORMAL HIGH (ref 70–99)

## 2022-12-06 LAB — I-STAT CHEM 8, ED
BUN: 16 mg/dL (ref 8–23)
Calcium, Ion: 1.18 mmol/L (ref 1.15–1.40)
Chloride: 102 mmol/L (ref 98–111)
Creatinine, Ser: 0.8 mg/dL (ref 0.44–1.00)
Glucose, Bld: 118 mg/dL — ABNORMAL HIGH (ref 70–99)
HCT: 45 % (ref 36.0–46.0)
Hemoglobin: 15.3 g/dL — ABNORMAL HIGH (ref 12.0–15.0)
Potassium: 3.7 mmol/L (ref 3.5–5.1)
Sodium: 137 mmol/L (ref 135–145)
TCO2: 27 mmol/L (ref 22–32)

## 2022-12-06 LAB — COMPREHENSIVE METABOLIC PANEL
ALT: 13 U/L (ref 0–44)
AST: 22 U/L (ref 15–41)
Albumin: 3.7 g/dL (ref 3.5–5.0)
Alkaline Phosphatase: 68 U/L (ref 38–126)
Anion gap: 12 (ref 5–15)
BUN: 16 mg/dL (ref 8–23)
CO2: 23 mmol/L (ref 22–32)
Calcium: 8.9 mg/dL (ref 8.9–10.3)
Chloride: 99 mmol/L (ref 98–111)
Creatinine, Ser: 0.91 mg/dL (ref 0.44–1.00)
GFR, Estimated: >60 mL/min (ref >60)
Glucose, Bld: 115 mg/dL — ABNORMAL HIGH (ref 70–99)
Potassium: 3.6 mmol/L (ref 3.5–5.1)
Sodium: 134 mmol/L — ABNORMAL LOW (ref 135–145)
Total Bilirubin: 0.7 mg/dL (ref 0.3–1.2)
Total Protein: 7.6 g/dL (ref 6.5–8.1)

## 2022-12-06 LAB — ETHANOL: Alcohol, Ethyl (B): <10 mg/dL (ref <10)

## 2022-12-06 LAB — APTT: aPTT: 24 seconds (ref 24–36)

## 2022-12-06 MED ORDER — APIXABAN 5 MG PO TABS: 5.0000 mg | ORAL_TABLET | Freq: Two times a day (BID) | ORAL | Status: DC

## 2022-12-06 MED ORDER — ACETAMINOPHEN 160 MG/5ML PO SOLN: 650.0000 mg | ORAL | Status: DC | PRN

## 2022-12-06 MED ORDER — AMIODARONE HCL 200 MG PO TABS
200.0000 mg | ORAL_TABLET | Freq: Every day | ORAL | Status: DC
  Administered 2022-12-07 – 2022-12-09 (×3): 200 mg via ORAL
  Filled 2022-12-06 (×3): qty 1

## 2022-12-06 MED ORDER — MIRTAZAPINE 15 MG PO TABS: 7.5000 mg | ORAL_TABLET | Freq: Every day | ORAL | Status: DC

## 2022-12-06 MED ORDER — LEVOTHYROXINE SODIUM 75 MCG PO TABS
175.0000 ug | ORAL_TABLET | Freq: Every day | ORAL | Status: DC
  Administered 2022-12-07 – 2022-12-09 (×3): 175 ug via ORAL
  Filled 2022-12-06 (×3): qty 1

## 2022-12-06 MED ORDER — METOPROLOL TARTRATE 25 MG PO TABS
12.5000 mg | ORAL_TABLET | Freq: Two times a day (BID) | ORAL | Status: DC
  Administered 2022-12-06 – 2022-12-09 (×6): 12.5 mg via ORAL
  Filled 2022-12-06 (×6): qty 1

## 2022-12-06 MED ORDER — ACETAMINOPHEN 325 MG PO TABS
650.0000 mg | ORAL_TABLET | ORAL | Status: DC | PRN
  Administered 2022-12-07 – 2022-12-08 (×2): 650 mg via ORAL
  Filled 2022-12-06 (×2): qty 2

## 2022-12-06 MED ORDER — STROKE: EARLY STAGES OF RECOVERY BOOK
Freq: Once | Status: AC
  Filled 2022-12-06: qty 1

## 2022-12-06 MED ORDER — HYDRALAZINE HCL 20 MG/ML IJ SOLN: 5.0000 mg | INTRAMUSCULAR | Status: DC | PRN

## 2022-12-06 MED ORDER — PANTOPRAZOLE SODIUM 40 MG PO TBEC
40.0000 mg | DELAYED_RELEASE_TABLET | Freq: Two times a day (BID) | ORAL | Status: DC
  Administered 2022-12-06 – 2022-12-09 (×6): 40 mg via ORAL
  Filled 2022-12-06 (×6): qty 1

## 2022-12-06 MED ORDER — IOHEXOL 350 MG/ML SOLN
100.0000 mL | Freq: Once | INTRAVENOUS | Status: AC | PRN
  Administered 2022-12-06: 100 mL via INTRAVENOUS

## 2022-12-06 MED ORDER — LABETALOL HCL 5 MG/ML IV SOLN: 5.0000 mg | INTRAVENOUS | Status: DC | PRN

## 2022-12-06 MED ORDER — ASPIRIN 300 MG RE SUPP
300.0000 mg | Freq: Every day | RECTAL | Status: DC
  Administered 2022-12-06: 300 mg via RECTAL
  Filled 2022-12-06: qty 1

## 2022-12-06 MED ORDER — ACETAMINOPHEN 650 MG RE SUPP: 650.0000 mg | RECTAL | Status: DC | PRN

## 2022-12-06 MED ORDER — ONDANSETRON HCL 4 MG/2ML IJ SOLN: 4.0000 mg | Freq: Four times a day (QID) | INTRAMUSCULAR | Status: DC | PRN

## 2022-12-06 MED ORDER — PERFLUTREN LIPID MICROSPHERE
1.0000 mL | INTRAVENOUS | Status: AC | PRN
  Administered 2022-12-06: 4 mL via INTRAVENOUS

## 2022-12-06 MED ORDER — SODIUM CHLORIDE 0.9% FLUSH
3.0000 mL | Freq: Once | INTRAVENOUS | Status: AC
  Administered 2022-12-06: 3 mL via INTRAVENOUS

## 2022-12-06 NOTE — Consult Note (Signed)
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 CTA ultimately confirmed a R M1 LVO. Patient is not a candidate for tPA due to concurrent Eliquis use, and she would also not be a candidate for endovascular intervention due to poor baseline functional status. Management is subsequently focused on secondary stroke prevention and rehab. Would also consider discussing goals of care with family as long-term prognosis is suspected to be poor.  Discussed with NIR Text: Patient is not a candidate for endovascular intervention due to baseline mRS of 4-5.  Our recommendations are outlined below.  Recommendations:        Stroke/Telemetry Floor       Neuro Checks       Bedside Swallow Eval       DVT Prophylaxis       IV Fluids, Normal Saline       Head of Bed 30 Degrees       Euglycemia and Avoid Hyperthermia (PRN Acetaminophen)       Antihypertensives PRN if Blood pressure is greater than 220/120 or there is a concern for End organ damage/contraindications for permissive HTN. If blood pressure is greater than 220/120 give labetalol PO or IV or Vasotec IV with a goal of 15% reduction in BP during the first 24 hours.       Recommend holding anticoagulation pending review of MRI (due to increased risk of hemorrhage with acute stroke)       May give ASA 300 mg PR daily in the  meantime       MRI head without contrast to evaluate ischemic burden; may also be helpful for prognostication       TTE to assess for intracardiac abnormalities       Lipid panel and hemoglobin A1c to assess for modifiable stroke risk factors       PT/OT/SLP evaluations when clinically appropriate; if family wants to pursue aggressive measures, she would likely benefit from some degree of rehab       Would otherwise hold goals of care discussion with family  Sign Out:       Discussed with Emergency Department Provider    ------------------------------------------------------------------------------  Advanced Imaging: CTA Head and Neck Completed.  CTP Completed.  LVO:Yes  Discussed with NIR :No  Discussed with NIR Text : Patient is not a candidate for endovascular intervention due to baseline mRS of 4-5.   Metrics: Last Known Well: 12/06/2022 02:00:00 [facility] Notification Time: 12/06/2022 04:32:59 Arrival Time: 12/06/2022 04:20:00 Stamp Time: 12/06/2022 04:32:59 Initial Response Time: 12/06/2022 04:34:18 Symptoms: left-sided weakness and right gaze deviation. Initial patient interaction: 12/06/2022 04:35:23 NIHSS Assessment Completed: 12/06/2022 04:44:28 Patient is not a candidate for Thrombolytic. Thrombolytic Medical Decision: 12/06/2022 04:44:31 Patient was not deemed candidate for Thrombolytic because of following reasons: Use of NOAs within 48 hours.  I personally Reviewed the CT Head and it Showed no acute process, though there was  concern for possible R MCA hyperdense vessel  Primary Provider Notified of Diagnostic Impression and Management Plan on: 12/06/2022 05:05:22    ------------------------------------------------------------------------------  History of Present Illness: Patient is a 77 year old Female.  Patient was brought by EMS for symptoms of left-sided weakness and right gaze deviation. Per EMS report, patient was last known normal around  2 AM this morning. She later started to develop a headache with associated left-sided weakness and right gaze deviation. She denied any other complaints at the time of my evaluation other than expressing a desire to sleep. She takes Eliquis for history of atrial fibrillation. Per chart review, patient is non-ambulatory at baseline and largely wheelchair bound and dependent on nursing for routine cares.   Past Medical History:      Atrial Fibrillation      Migraine Headaches Other PMH:  CHF, hypothyroidism, anxiety, HTN, CHF, chronic lymphedema, colon cancer, depression  Medications:  Anticoagulant use:  Yes Eliquis No Antiplatelet use Reviewed EMR for current medications  Allergies:  Reviewed Description: codeine, compazine, prednisone, Vicodin  Social History: Unable To Obtain Due To Patient Status : Patient Is Confused  Family History:  Family History Cannot Be Obtained Because:Patient Is Confused  ROS : ROS Cannot Be Obtained Because:  Patient Is Confused  Past Surgical History: Past Surgical History Cannot Be Obtained Because: Patient Is Confused    Examination: BP(103/80), Pulse(81), Blood Glucose(118) 1A: Level of Consciousness - Arouses to minor stimulation + 1 1B: Ask Month and Age - Could Not Answer Either Question Correctly + 2 1C: Blink Eyes & Squeeze Hands - Performs Both Tasks + 0 2: Test Horizontal Extraocular Movements - Forced Gaze Palsy: Cannot Be Overcome + 2 3: Test Visual Fields - Complete Hemianopia + 2 4: Test Facial Palsy (Use Grimace if Obtunded) - Partial paralysis (lower face) + 2 5A: Test Left Arm Motor Drift - No Movement + 4 5B: Test Right Arm Motor Drift - No Drift for 10 Seconds + 0 6A: Test Left Leg Motor Drift - No Effort Against Gravity + 3 6B: Test Right Leg Motor Drift - Some Effort Against Gravity + 2 7: Test Limb Ataxia (FNF/Heel-Shin) - No Ataxia + 0 8: Test Sensation - Normal; No sensory loss + 0 9: Test Language/Aphasia - Normal;  No aphasia + 0 10: Test Dysarthria - Mild-Moderate Dysarthria: Slurring but can be understood + 1 11: Test Extinction/Inattention - Visual/tactile/auditory/spatial/personal inattention + 1  NIHSS Score: 20  NIHSS Free Text : Possible left hemisensory neglect. Lethargic but able to answer simple commands, follow simple commands, and name items. No overt loss of fluency.  Pre-Morbid Modified Rankin Scale: 4 Points = Moderately severe disability; unable to walk and attend to bodily needs without assistance  Spoke with : Dr. Betsey Holiday  Patient/Family was informed the Neurology Consult would occur via TeleHealth consult by way of interactive audio and video telecommunications and consented to receiving care in this manner.   Patient is being evaluated for possible acute neurologic impairment and high probability of imminent or life-threatening deterioration. I spent total of 30 minutes providing care to this patient, including time for face to face visit via telemedicine, review of medical records, imaging studies and discussion of findings with providers, the patient and/or family.   Dr Larkin Ina De'Prey   [facility] For Inpatient follow-up with [facility] physician please call [facility] [phone]. This is not an outpatient service. Post hospital discharge, please contact hospital directly.  Please do not communicate with [facility] physicians via  secure chat. If you have any questions, Please contact [facility].

## 2022-12-06 NOTE — Evaluation (Signed)
Physical Therapy Evaluation Patient Details Name: Lauren [name] MRN: [id number] DOB: June 07, 1946 Today's Date: 12/06/2022  History of Present Illness  Lauren [name] is a 77 y.o. female with medical history significant for morbid obesity, colon cancer with prior hemicolectomy, chronic combined systolic and diastolic CHF, atrial fibrillation on Eliquis, chronic lymphedema, hypertension, and hypothyroidism who presented to the ED via EMS from her SNF on account of headache that proceeded to left-sided weakness and right gaze deviation.  She was last known normal around 2 AM this morning.  She is currently complaining of some anxiety and denies any headache. (Per DO)   Clinical Impression  Patient demonstrates slow labored movement for sitting up at bedside with limited use of left side due to weakness, once seated had frequent leaning to the left with poor sitting balance and unable to attempt sit to stands due to fall risk and left sided weakness.  Patient required Max assist to reposition when put back to bed.  Patient will benefit from continued skilled physical therapy in hospital and recommended venue below to increase strength, balance, endurance for safe ADLs and gait.         Recommendations for follow up therapy are one component of a multi-disciplinary discharge planning process, led by the attending physician.  Recommendations may be updated based on patient status, additional functional criteria and insurance authorization.  Follow Up Recommendations Skilled nursing-short term rehab (<3 hours/day) Can patient physically be transported by private vehicle: No    Assistance Recommended at Discharge Intermittent Supervision/Assistance  Patient can return home with the following  A lot of help with walking and/or transfers;A lot of help with bathing/dressing/bathroom;Assistance with cooking/housework;Assist for transportation;Help with stairs or ramp for entrance    Equipment  Recommendations None recommended by PT  Recommendations for Other Services       Functional Status Assessment Patient has had a recent decline in their functional status and demonstrates the ability to make significant improvements in function in a reasonable and predictable amount of time.     Precautions / Restrictions Precautions Precautions: Fall Restrictions Weight Bearing Restrictions: No      Mobility  Bed Mobility Overal bed mobility: Needs Assistance Bed Mobility: Supine to Sit, Sit to Supine     Supine to sit: Mod assist, Max assist Sit to supine: Max assist   General bed mobility comments: slow labored movement with limited use of left side due to weakness    Transfers                        Ambulation/Gait                  Stairs            Wheelchair Mobility    Modified Rankin (Stroke Patients Only)       Balance Overall balance assessment: Needs assistance Sitting-balance support: Feet supported, Bilateral upper extremity supported Sitting balance-Leahy Scale: Poor Sitting balance - Comments: Seated EOB Postural control: Posterior lean                                   Pertinent Vitals/Pain Pain Assessment Pain Assessment: Faces Faces Pain Scale: Hurts a little bit Pain Location: L ear and throat. Pain Descriptors / Indicators: Sore Pain Intervention(s): Limited activity within patient's tolerance, Monitored during session, Repositioned    Home Living Family/patient expects to be discharged to::  Skilled nursing facility                        Prior Function Prior Level of Function : Needs assist       Physical Assist : Mobility (physical);ADLs (physical) Mobility (physical): Transfers;Gait ADLs (physical): IADLs Mobility Comments: Chart review indicates pt does not get up without assist. [name] guardianship worker states that pt is able to ambulte to the bathroom with RW. Pt confirmed this  as well. ADLs Comments: Pt reports indepnedence for ADL's. Assist for IADL's by SNF staff.     Hand Dominance   Dominant Hand: Right    Extremity/Trunk Assessment   Upper Extremity Assessment Upper Extremity Assessment: Defer to OT evaluation RUE Deficits / Details: Generally weak. LUE Deficits / Details: Moderate flexor tone. Unable to reach full P/ROM of shoulder due to tone. ~50% of available flexion and abduction of shoulder. Able to achieve full P/ROM of elbow extension and wrist extension. Much assist for composit extension with moderate to maximal flexor tone. LUE Sensation: decreased light touch;decreased proprioception LUE Coordination: decreased fine motor;decreased gross motor    Lower Extremity Assessment Lower Extremity Assessment: Generalized weakness;LLE deficits/detail LLE Deficits / Details: grossly 2/5 LLE Sensation: decreased light touch;decreased proprioception LLE Coordination: decreased fine motor;decreased gross motor    Cervical / Trunk Assessment Cervical / Trunk Assessment: Kyphotic  Communication   Communication: Expressive difficulties (slurred speech)  Cognition Arousal/Alertness: Awake/alert Behavior During Therapy: WFL for tasks assessed/performed Overall Cognitive Status: Within Functional Limits for tasks assessed                                          General Comments      Exercises     Assessment/Plan    PT Assessment Patient needs continued PT services  PT Problem List Decreased strength;Decreased activity tolerance;Decreased balance;Decreased mobility;Decreased coordination;Decreased range of motion;Impaired sensation       PT Treatment Interventions DME instruction;Gait training;Stair training;Functional mobility training;Therapeutic activities;Therapeutic exercise;Balance training;Neuromuscular re-education;Patient/family education    PT Goals (Current goals can be found in the Care Plan section)  Acute Rehab  PT Goals Patient Stated Goal: return home PT Goal Formulation: With patient Time For Goal Achievement: 12/20/22 Potential to Achieve Goals: Good    Frequency Min 3X/week     Co-evaluation PT/OT/SLP Co-Evaluation/Treatment: Yes Reason for Co-Treatment: Complexity of the patient's impairments (multi-system involvement);To address functional/ADL transfers PT goals addressed during session: Mobility/safety with mobility;Balance;Proper use of DME OT goals addressed during session: ADL's and self-care       AM-PAC PT "6 Clicks" Mobility  Outcome Measure Help needed turning from your back to your side while in a flat bed without using bedrails?: A Lot Help needed moving from lying on your back to sitting on the side of a flat bed without using bedrails?: A Lot Help needed moving to and from a bed to a chair (including a wheelchair)?: Total Help needed standing up from a chair using your arms (e.g., wheelchair or bedside chair)?: Total Help needed to walk in hospital room?: Total Help needed climbing 3-5 steps with a railing? : Total 6 Click Score: 8    End of Session   Activity Tolerance: Patient tolerated treatment well;Patient limited by fatigue Patient left: in bed;with call bell/phone within reach Nurse Communication: Mobility status PT Visit Diagnosis: Unsteadiness on feet (R26.81);Other abnormalities of gait and mobility (  R26.89);Muscle weakness (generalized) (M62.81)    Time: 6789-3810 PT Time Calculation (min) (ACUTE ONLY): 31 min   Charges:   PT Evaluation $PT Eval Moderate Complexity: 1 Mod PT Treatments $Therapeutic Activity: 23-37 mins        11:30 AM, 12/06/22 Lonell Grandchild, MPT Physical Therapist with [facility] [phone] office [phone] mobile phone

## 2022-12-06 NOTE — ED Provider Notes (Signed)
[facility] EMERGENCY DEPARTMENT Provider Note   CSN: [id number] Arrival date & time: 12/06/22  6834  An emergency department physician performed an initial assessment on this suspected stroke patient at [place].  History  Chief Complaint  Patient presents with   Code Stroke    Lauren [name] is a 77 y.o. female.  Patient sent to the emergency department by ambulance from nursing home for possible stroke.  Nursing home staff told EMS that the patient had been checked on and was normal within the last 2 hours before their call.  Approximately 30 minutes before EMS was called, patient complained of a headache and then she was noted to have left facial droop.  Patient with left arm and leg weakness, right gaze preference.  She arrives as a code stroke.       Home Medications Prior to Admission medications   Medication Sig Start Date End Date Taking? Authorizing Provider  acetaminophen (TYLENOL) 325 MG tablet Take 2 tablets (650 mg total) by mouth every 6 (six) hours as needed for mild pain. Patient not taking: Reported on 12/08/2021 09/03/21   Samella Parr, NP  amiodarone (PACERONE) 200 MG tablet TAKE 1 TABLET DAILY 02/25/22   Arnoldo Lenis, MD  apixaban (ELIQUIS) 5 MG TABS tablet Take 1 tablet (5 mg total) by mouth 2 (two) times daily. Please cancel previous refill order.  Sent to wrong pharmacy. 01/15/22   Arnoldo Lenis, MD  cetirizine (ZYRTEC) 10 MG tablet Take 10 mg by mouth daily as needed (for itching).    [provider]  Cholecalciferol (VITAMIN D3) 25 MCG (1000 UT) CAPS Take 1,000 Units by mouth in the morning.    [provider]  feeding supplement (ENSURE ENLIVE / ENSURE PLUS) LIQD Take 237 mLs by mouth 3 (three) times daily between meals. Patient not taking: Reported on 12/08/2021 09/03/21   Samella Parr, NP  ferrous sulfate 325 (65 FE) MG EC tablet Take 1 tablet (325 mg total) by mouth 2 (two) times daily. Patient not taking: Reported on  12/08/2021 07/17/21 07/17/22  Kathie Dike, MD  ipratropium-albuterol (DUONEB) 0.5-2.5 (3) MG/3ML SOLN Take 3 mLs by nebulization every 2 (two) hours as needed. Patient not taking: Reported on 12/08/2021 09/03/21   Samella Parr, NP  levothyroxine (SYNTHROID) 175 MCG tablet Take 175 mcg by mouth daily before breakfast.    [provider]  liver oil-zinc oxide (DESITIN) 40 % ointment Apply topically as needed for irritation. Patient not taking: Reported on 12/08/2021 09/03/21   Samella Parr, NP  metoprolol tartrate (LOPRESSOR) 25 MG tablet TAKE 1/2 TABLET 2 TIMES A DAY 02/25/22   Arnoldo Lenis, MD  mirtazapine (REMERON) 7.5 MG tablet Take 1 tablet (7.5 mg total) by mouth at bedtime. 09/03/21   Samella Parr, NP  Multiple Vitamin (MULTIVITAMIN WITH MINERALS) TABS tablet Take 1 tablet by mouth daily. Patient not taking: Reported on 12/08/2021 09/07/21   Samella Parr, NP  OXYGEN Inhale 2 L/min into the lungs as needed (for shortness of breath).    [provider]  pantoprazole (PROTONIX) 40 MG tablet TAKE 1 TABLET 2 TIMES A DAY 01/14/22   Annitta Needs, NP  potassium chloride SA (KLOR-CON M) 20 MEQ tablet TAKE 1 TABLET DAILY 02/25/22   Arnoldo Lenis, MD  torsemide (DEMADEX) 20 MG tablet TAKE 1 TABLET ONCE A DAY 02/25/22   Arnoldo Lenis, MD      Allergies    Codeine, Compazine [  prochlorperazine edisylate], Prednisone, Tetanus toxoids, and Vicodin [hydrocodone-acetaminophen]    Review of Systems   Review of Systems  Physical Exam Updated Vital Signs BP 106/79 (BP Location: Left Wrist)   Pulse (!) 101   Temp 98.6 F (37 C) (Oral)   Resp 18   Wt 111.1 kg   LMP 09/13/2016   SpO2 99%   BMI 44.81 kg/m  Physical Exam Vitals and nursing note reviewed.  Constitutional:      General: She is not in acute distress.    Appearance: She is well-developed.  HENT:     Head: Normocephalic and atraumatic.     Mouth/Throat:     Mouth: Mucous membranes are moist.   Eyes:     General: Vision grossly intact.     Extraocular Movements:     Right eye: Abnormal extraocular motion present.     Left eye: Abnormal extraocular motion present.     Conjunctiva/sclera: Conjunctivae normal.     Comments: Right gaze preference, will not cross midline  Cardiovascular:     Rate and Rhythm: Normal rate. Rhythm irregularly irregular.     Pulses: Normal pulses.     Heart sounds: Normal heart sounds, S1 normal and S2 normal. No murmur heard.    No friction rub. No gallop.  Pulmonary:     Effort: Pulmonary effort is normal. No respiratory distress.     Breath sounds: Normal breath sounds.  Abdominal:     General: Bowel sounds are normal.     Palpations: Abdomen is soft.     Tenderness: There is no abdominal tenderness. There is no guarding or rebound.     Hernia: No hernia is present.  Musculoskeletal:        General: No swelling.     Cervical back: Full passive range of motion without pain, normal range of motion and neck supple. No spinous process tenderness or muscular tenderness. Normal range of motion.     Right lower leg: No edema.     Left lower leg: No edema.  Skin:    General: Skin is warm and dry.     Capillary Refill: Capillary refill takes less than 2 seconds.     Findings: No ecchymosis, erythema, rash or wound.  Neurological:     General: No focal deficit present.     Mental Status: She is alert and oriented to person, place, and time.     GCS: GCS eye subscore is 4. GCS verbal subscore is 5. GCS motor subscore is 6.     Cranial Nerves: Facial asymmetry (Left facial droop) present.     Sensory: Sensory deficit present.     Motor: Weakness present.  Psychiatric:        Speech: Speech is slurred.     ED Results / Procedures / Treatments   Labs (all labs ordered are listed, but only abnormal results are displayed) Labs Reviewed  COMPREHENSIVE METABOLIC PANEL - Abnormal; Notable for the following components:      Result Value   Sodium 134  (*)    Glucose, Bld 115 (*)    All other components within normal limits  I-STAT CHEM 8, ED - Abnormal; Notable for the following components:   Glucose, Bld 118 (*)    Hemoglobin 15.3 (*)    All other components within normal limits  CBG MONITORING, ED - Abnormal; Notable for the following components:   Glucose-Capillary 118 (*)    All other components within normal limits  PROTIME-INR  APTT  CBC  DIFFERENTIAL  ETHANOL    EKG EKG Interpretation  Date/Time:  Friday December 06 2022 04:35:33 EST Ventricular Rate:  99 PR Interval:    QRS Duration: 105 QT Interval:  391 QTC Calculation: 502 R Axis:   -18 Text Interpretation: Atrial fibrillation Borderline left axis deviation Prolonged QT interval Confirmed by Orpah Greek [phone]) on 12/06/2022 4:45:33 AM  Radiology CT ANGIO HEAD NECK W WO CM W PERF (CODE STROKE)  Result Date: 12/06/2022 CLINICAL DATA:  77 year old female code stroke presentation with left side flaccid, rightward gaze, hyperdense MCA on plain head CT. EXAM: CT ANGIOGRAPHY HEAD AND NECK CT PERFUSION BRAIN TECHNIQUE: Multidetector CT imaging of the head and neck was performed using the standard protocol during bolus administration of intravenous contrast. Multiplanar CT image reconstructions and MIPs were obtained to evaluate the vascular anatomy. Carotid stenosis measurements (when applicable) are obtained utilizing NASCET criteria, using the distal internal carotid diameter as the denominator. Multiphase CT imaging of the brain was performed following IV bolus contrast injection. Subsequent parametric perfusion maps were calculated using RAPID software. RADIATION DOSE REDUCTION: This exam was performed according to the departmental dose-optimization program which includes automated exposure control, adjustment of the mA and/or kV according to patient size and/or use of iterative reconstruction technique. CONTRAST:  177m OMNIPAQUE IOHEXOL 350 MG/ML SOLN 100 mL  Omnipaque 350 COMPARISON:  Plain head CT today 0428 hours. FINDINGS: CT Brain Perfusion Findings: ASPECTS: 10 CBF (<30%) Volume: 087mPerfusion (Tmax>6.0s) volume: 10857merritory. Hypoperfusion index 0.5 (59 mL of T-max > 10 S). Mismatch Volume: 108m49mfarction Location:Right MCA CTA NECK Skeleton: Carious dentition. Cervical spine degeneration. No acute osseous abnormality identified. Upper chest: Patchy peribronchial ground-glass opacity in the right upper lobe, multifocal. Mild reactive appearing superior mediastinal lymph nodes. Visible major airways are patent. Other visible pulmonary lobes appear spared. Other neck: Retropharyngeal course of both carotids. Mild retained secretions in the nasopharynx. Paranasal sinus fluid and mucosal thickening. Rightward gaze. Aortic arch: 3 vessel arch configuration.  No arch atherosclerosis. Right carotid system: Brachiocephalic artery, right CCA and right carotid bifurcation are negative. Mild if any soft plaque at the right ICA origin. No stenosis to the skull base. Left carotid system: Negative aside from a retropharyngeal course. Vertebral arteries: Mild proximal subclavian artery plaque without stenosis. Patent vertebral arteries to the skull base with no stenosis, the left is mildly dominant. CTA HEAD Posterior circulation: Mildly dominant left V4 segment. Patent vertebrobasilar junction with no distal vertebral stenosis. PICA origins appear normal. Patent basilar artery, SCA and PCA origins. Right posterior communicating artery is present, left diminutive or absent. Bilateral PCA branches are within normal limits. Fetal type right PCA origin suspected rather than right PCA origin stenosis on series 10, image 28. Anterior circulation: Both ICA siphons are patent. Calcified plaque more pronounced on the left. Only mild left siphon stenosis. No significant right siphon stenosis. Patent carotid termini, MCA and ACA origins. Diminutive anterior communicating artery.  Bilateral ACA branches are within normal limits. Right MCA M1 occlusion about 8 mm from its origin on series 9 image 15. This is just distal to the anterior temporal artery origin. There is some right M2 branch reconstitution. Left MCA M1 segment and bifurcation are patent without stenosis. Left MCA branches are within normal limits. Venous sinuses: Early contrast timing. Superior sagittal sinus appears patent. Anatomic variants: Mildly dominant left vertebral artery, mildly fetal type right PCA origin. Review of the MIP images confirms the above findings IMPRESSION: 1. Positive for ELVO:  Right MCA M1 occluded just distal to the anterior temporal artery origin. This was discussed by telephone with Dr. Joseph Berkshire on 12/06/2022 at 0501 hours. 2. Subsequent CT Perfusion parameters favorable to reperfusion: No infarct core detected (CBF less than 30%), concordant with normal ASPECTS. And 108 mL of right MCA territory penumbra with hypoperfusion index of 0.5. 3. Underlying mild for age atherosclerosis in the head or neck. 4. Multifocal right upper lobe patchy peribronchial ground-glass opacity. This might be mild aspiration in this setting. Otherwise consider developing Bronchopneumonia. Left upper lung appears clear. Electronically Signed   By: Genevie Ann M.D.   On: 12/06/2022 05:10   CT HEAD CODE STROKE WO CONTRAST  Result Date: 12/06/2022 CLINICAL DATA:  Code stroke. 77 year old female with slurred speech, left facial droop, right gaze. EXAM: CT HEAD WITHOUT CONTRAST TECHNIQUE: Contiguous axial images were obtained from the base of the skull through the vertex without intravenous contrast. RADIATION DOSE REDUCTION: This exam was performed according to the departmental dose-optimization program which includes automated exposure control, adjustment of the mA and/or kV according to patient size and/or use of iterative reconstruction technique. COMPARISON:  Head CT 06/14/2022.  Brain MRI 06/10/2022. FINDINGS:  Brain: Stable cerebral volume. No midline shift, mass effect, or evidence of intracranial mass lesion. No ventriculomegaly. Patchy asymmetric white matter hypodensity greater in the left hemisphere appears stable. No cortically based acute infarct identified. However, there is suspicious hyperdensity of the right MCA. No cytotoxic edema identified. Vascular: Calcified atherosclerosis at the skull base. Skull: Stable hyperostosis. No acute osseous abnormality identified. Sinuses/Orbits: Widespread paranasal sinus bubbly opacity, fluid levels. Retained secretions in the visible pharynx. Tympanic cavities and mastoids remain clear. Other: Rightward gaze. Visualized scalp soft tissues are within normal limits. ASPECTS [facility] Stroke Program Early CT Score) Total score (0-10 with 10 being normal): 10 IMPRESSION: 1. Suspicious dense Right MCA, with rightward gaze strongly suggesting ELVO in this setting. 2. ASPECTS 10. No acute cortically based infarct or intracranial hemorrhage identified. 3. Acute paranasal sinusitis with retained secretions in the pharynx. Electronically Signed   By: Genevie Ann M.D.   On: 12/06/2022 04:36    Procedures Procedures    Medications Ordered in ED Medications  sodium chloride flush (NS) 0.9 % injection 3 mL (3 mLs Intravenous Given 12/06/22 0430)  iohexol (OMNIPAQUE) 350 MG/ML injection 100 mL (100 mLs Intravenous Contrast Given 12/06/22 0446)    ED Course/ Medical Decision Making/ A&P                           Medical Decision Making Amount and/or Complexity of Data Reviewed Independent Historian: caregiver and EMS External Data Reviewed: labs and notes. Labs: ordered. Decision-making details documented in ED Course. Radiology: ordered and independent interpretation performed. Decision-making details documented in ED Course. ECG/medicine tests: ordered and independent interpretation performed. Decision-making details documented in ED Course.  Risk Prescription drug  management. Decision regarding hospitalization.  Critical Care Total time providing critical care: 35 minutes   Patient presents as a code stroke.  At arrival to the emergency department she does have an exam that is suspicious for either bleed or LVO.  Patient with rightward gaze preference, left facial droop, left-sided hemiparesis.  Patient is answering questions at arrival, protecting her airway.  Sent to radiology for CT head.  There was suspicion for thrombus in the right MCA but aspects ratio on head was 10, no stroke apparent.  Patient was sent back to radiology for  CTA and perfusion study.  She does have a occluded right M1.  Patient was a code stroke at arrival, teleneurology evaluation was performed essentially at her arrival.  Despite findings of LVO on imaging, it was felt that the patient was not a candidate for IR because she is essentially bedbound/wheelchair-bound.  This  was confirmed with Dr. Cheral Marker.  Patient does not need to be transferred to [facility].        Final Clinical Impression(s) / ED Diagnoses Final diagnoses:  Cerebrovascular accident (CVA) due to thrombosis of right middle cerebral artery [facility])    Rx / DC Orders ED Discharge Orders     None         Karey Suthers, Gwenyth Allegra, MD 12/06/22 [phone]

## 2022-12-06 NOTE — TOC Initial Note (Addendum)
Transition of Care [facility]) - Initial/Assessment Note    Patient Details  Name: MARTITA BRUMM MRN: [id number] Date of Birth: November 28, 1945  [facility] CM/SW Contact:    Shade Flood, LCSW Phone Number: 12/06/2022, 9:15 AM  Clinical Narrative:                  Pt admitted from [facility]. PT recommending SNF at dc. MD notes indicate Palliative Consult needed for [place].   TOC spoke with Melissa at [facility] to update. If pt appropriate for SNF rehab, will need auth.  Will await outcome of GOC discussion and assist as needed.  11:05 Discussed with MD. TOC will start auth for SNF rehab when pt returns to [facility].  1550: Pt has a legal guardian with [place], [name], [phone]. Updated Chance on pt's status. Chance would like a call when pt discharging back to [facility].   Expected Discharge Plan: Skilled Nursing Facility Barriers to Discharge: Continued Medical Work up   Patient Goals and CMS Choice            Expected Discharge Plan and Services In-house Referral: Clinical Social Work     Living arrangements for the past 2 months: [place]                                      Prior Living Arrangements/Services Living arrangements for the past 2 months: [place] Lives with:: Facility Resident Patient language and need for interpreter reviewed:: Yes Do you feel safe going back to the place where you live?: Yes      Need for Family Participation in Patient Care: No (Comment) Care giver support system in place?: Yes (comment)   Criminal Activity/Legal Involvement Pertinent to Current Situation/Hospitalization: No - Comment as needed  Activities of Daily Living      Permission Sought/Granted                  Emotional Assessment       Orientation: : Oriented to Self Alcohol / Substance Use: Not Applicable Psych Involvement: No (comment)  Admission diagnosis:  Acute CVA  (cerebrovascular accident) 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) 07/18/2021   Hyponatremia 07/18/2021   Colon carcinoma ([place])    Morbid obesity with BMI of 50.0-59.9, adult ([place]) 07/04/2021   Chronic combined systolic and diastolic CHF (congestive heart failure) ([place]) 07/04/2021   Constipation    Colonic mass    Iron deficiency anemia 06/29/2021   Elevated troponin 06/29/2021   Hypokalemia 06/29/2021   Rectal pain 06/29/2021   Chronic acquired lymphedema 02/17/2018   Essential hypertension with goal blood pressure less than 130/80 02/17/2018   Anxiety state 03/24/2014   Gallstone pancreatitis 03/23/2014   Acute cholecystitis 12/06/2012   Atrial fibrillation ([place]) 12/06/2012   Pancreatitis, acute 12/04/2012   Hypothyroidism 12/04/2012   Obesity (BMI 35.0-39.9 without comorbidity) 12/04/2012  PCP:  Nanine Means Pharm[facility]:   [facility] [place], [place] - [place] [address] [place] [address] Phone: [phone] Fax: [place], [place] [address] Phone: [phone] Fax: [phone]     Social Determinants of Health (SDOH) Social History: SDOH Screenings   Tobacco Use: Low Risk  (12/06/2022)   SDOH Interventions:     Readmission Risk Interventions    07/12/2021   10:46 AM   Readmission Risk Prevention Plan  Transportation Screening Complete  Home Care Screening Complete  Medication Review (RN CM) Complete

## 2022-12-06 NOTE — Evaluation (Signed)
Clinical/Bedside Swallow Evaluation Patient Details  Name: Lauren [name] MRN: [id number] Date of Birth: 1946-05-10  Today's Date: 12/06/2022 Time: SLP Start Time (ACUTE ONLY): 12 SLP Stop Time (ACUTE ONLY): 6283 SLP Time Calculation (min) (ACUTE ONLY): 30 min  Past Medical History:  Past Medical History:  Diagnosis Date   Acute cholecystitis 12/06/2012   Chronic combined systolic and diastolic CHF (congestive heart failure) (HCC)    Colon cancer (HCC)    Diverticulosis    Hypotension    BP runs soft   Hypothyroidism    Lymphedema    Morbid obesity ([place])    Obesity    Pancreatitis, acute 66/29/4765   Periumbilical hernia    Permanent atrial fibrillation [facility])    Past Surgical History:  Past Surgical History:  Procedure Laterality Date   BIOPSY  07/05/2021   Procedure: BIOPSY;  Surgeon: [name], DO;  [facility];  Service: Endoscopy;;   CHOLECYSTECTOMY N/A 03/25/2014   Procedure: LAPAROSCOPIC CHOLECYSTECTOMY WITH INTRAOPERATIVE CHOLANGIOGRAM;  Surgeon: Ralene Ok, MD;  Location: [facility];  Service: General;  Laterality: N/A;   COLONOSCOPY WITH PROPOFOL N/A 07/05/2021   Procedure: COLONOSCOPY WITH PROPOFOL;  Surgeon: [name], DO;  [facility];  Service: Endoscopy;  Laterality: N/A;   ESOPHAGOGASTRODUODENOSCOPY (EGD) WITH PROPOFOL N/A 07/05/2021   Procedure: ESOPHAGOGASTRODUODENOSCOPY (EGD) WITH PROPOFOL;  Surgeon: [name], DO;  [facility];  Service: Endoscopy;  Laterality: N/A;   PARTIAL COLECTOMY N/A 07/06/2021   Procedure: RIGHT HEMICOLECTOMY;  Surgeon: [name] Signs, MD;  Location: [facility];  Service: General;  Laterality: N/A;   POLYPECTOMY  07/05/2021   Procedure: POLYPECTOMY;  Surgeon: [name], DO;  [facility];  Service: Endoscopy;;   HPI:  Lauren [name] is a 77 y.o. female with medical history significant for morbid obesity, colon cancer with prior hemicolectomy, chronic combined  systolic and diastolic CHF, atrial fibrillation on Eliquis, chronic lymphedema, hypertension, and hypothyroidism who presented to the ED via EMS from her SNF on account of headache that proceeded to left-sided weakness and right gaze deviation.  She was last known normal around 2 AM this morning.  She is currently complaining of some anxiety and denies any headache. BSE and SLE requested    Assessment / Plan / Recommendation  Clinical Impression  Clinical swallowing evaluation completed while Pt was sitting upright in bed; Pt with significant LEFT facial droop and note severely slurred speech (flaccid dysarthria). Pt was lethargic and required max verbal cues to remain alert. Pt was provided single ice chips and note poor oral awareness and coordination with LEFT anterior spillage of water after ice melted; note immedate and delayed very wet and congested sounding cough. Also note immediate and overt s/sx of aspiraiton with limited thin trials. Recommend continue NPO status and proceed with instrumental testing prior to initiation of diet secondary to overt s/sx of aspiration and Pt being at very high risk for aspiration. Will attempt MBSS later this afternoon when Pt is more alert/appropriate and scheduling permits. If MBSS cannot be completed today, it will likely be Monday, 12/09/22.   Also acknowledge SLE order, ST will follow up to complete when Pt is more alert. Thank you,  SLP Visit Diagnosis: Dysphagia, unspecified (R13.10)    Aspiration Risk  Severe aspiration risk;Risk for inadequate nutrition/hydration    Diet Recommendation NPO        Other  Recommendations Oral Care Recommendations: Oral care QID    Recommendations for  follow up therapy are one component of a multi-disciplinary discharge planning process, led by the attending physician.  Recommendations may be updated based on patient status, additional functional criteria and insurance authorization.  Follow up Recommendations  Skilled nursing-short term rehab (<3 hours/day)            Frequency and Duration min 2x/week  1 week       Prognosis Prognosis for Safe Diet Advancement: Fair Barriers to Reach Goals: Cognitive deficits;Severity of deficits      Swallow Study   General Date of Onset: 12/06/22 HPI: Lauren [name] is a 77 y.o. female with medical history significant for morbid obesity, colon cancer with prior hemicolectomy, chronic combined systolic and diastolic CHF, atrial fibrillation on Eliquis, chronic lymphedema, hypertension, and hypothyroidism who presented to the ED via EMS from her SNF on account of headache that proceeded to left-sided weakness and right gaze deviation.  She was last known normal around 2 AM this morning.  She is currently complaining of some anxiety and denies any headache. BSE and SLE requested Type of Study: Bedside Swallow Evaluation Previous Swallow Assessment: none in chart Diet Prior to this Study: NPO Temperature Spikes Noted: No Respiratory Status: Room air History of Recent Intubation: No Behavior/Cognition: Cooperative;Confused;Lethargic/Drowsy Oral Care Completed by SLP: Recent completion by staff Vision: Impaired for self-feeding Self-Feeding Abilities: Total assist Patient Positioning: Upright in bed Volitional Cough: Congested Volitional Swallow: Unable to elicit    Oral/Motor/Sensory Function Overall Oral Motor/Sensory Function: Severe impairment Facial ROM: Reduced left Facial Symmetry: Abnormal symmetry left Facial Strength: Reduced left Facial Sensation: Reduced left   Ice Chips Ice chips: Impaired Presentation: Spoon Oral Phase Impairments: Reduced labial seal;Reduced lingual movement/coordination;Impaired mastication;Poor awareness of bolus Oral Phase Functional Implications: Left anterior spillage Pharyngeal Phase Impairments: Cough - Delayed;Cough - Immediate;Unable to trigger swallow (no swallow trigger noted)   Thin Liquid Thin Liquid:  Impaired Presentation: Spoon Oral Phase Impairments: Poor awareness of bolus;Impaired mastication;Reduced lingual movement/coordination;Reduced labial seal Oral Phase Functional Implications: Left anterior spillage Pharyngeal  Phase Impairments: Cough - Immediate;Cough - Delayed;Suspected delayed Swallow    Nectar Thick Nectar Thick Liquid: Not tested   Honey Thick Honey Thick Liquid: Not tested   Puree Puree: Not tested   Solid     Solid: Not tested     Yaiden Yang H. Roddie Mc, CCC-SLP Speech Language Pathologist  Wende Bushy 12/06/2022,10:56 AM

## 2022-12-06 NOTE — ED Notes (Signed)
EDP verified code stroke

## 2022-12-06 NOTE — ED Notes (Addendum)
Pt unable to hold cup or follow commands when attempting swallow screen--MD made aware

## 2022-12-06 NOTE — NC FL2 (Signed)
[place] LEVEL OF CARE FORM     IDENTIFICATION  Patient Name: Lauren [name] Birthdate: 05-Jun-1946 Sex: female Admission Date (Current Location): 12/06/2022  [facility] and [place] Number:  [employer] and Address:  [place] [address] [place]      Provider Number: [id number]  Attending Physician Name and Address:  Rodena Goldmann, DO  Relative Name and Phone Number:       Current Level of Care: Hospital Recommended Level of Care: [place] Prior Approval Number:    Date Approved/Denied:   PASRR Number:    Discharge Plan: SNF    Current Diagnoses: Patient Active Problem List   Diagnosis Date Noted   Acute CVA (cerebrovascular accident) ([place]) 12/06/2022   CVA (cerebral vascular accident) ([place]) 12/06/2022   AMS (altered mental status) 12/08/2021   Physical deconditioning 09/07/2021   Lymphedema (chronic) 09/07/2021   Chronic anticoagulation 09/07/2021   Severe protein-calorie malnutrition ([place]) 09/07/2021   Anxiety with depression 09/07/2021   COVID-19 infection (asymptomatic-completed isolation) 09/07/2021   Abdominal pain    Septic shock ([place]) 07/18/2021   Sepsis ([place]) 07/18/2021   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   Cellulitis 07/18/2021   AKI (acute kidney injury) ([place]) 07/18/2021   Hyponatremia 07/18/2021   Colon carcinoma ([place])    Morbid obesity with BMI of 50.0-59.9, adult ([place]) 07/04/2021   Chronic combined systolic and diastolic CHF (congestive heart failure) ([place]) 07/04/2021   Constipation    Colonic mass    Iron deficiency anemia 06/29/2021   Elevated troponin 06/29/2021   Hypokalemia 06/29/2021   Rectal pain 06/29/2021   Chronic acquired lymphedema 02/17/2018   Essential hypertension with goal blood pressure less than 130/80 02/17/2018   Anxiety state 03/24/2014   Gallstone pancreatitis 03/23/2014   Acute cholecystitis 12/06/2012   Atrial fibrillation ([place]) 12/06/2012    Pancreatitis, acute 12/04/2012   Hypothyroidism 12/04/2012   Obesity (BMI 35.0-39.9 without comorbidity) 12/04/2012    [place] RESPIRATION BLADDER Height & Weight     Self  Normal Incontinent Weight: 245 lb (111.1 kg) Height:     BEHAVIORAL SYMPTOMS/MOOD NEUROLOGICAL BOWEL NUTRITION STATUS      Incontinent Diet (see dc summary)  AMBULATORY STATUS COMMUNICATION OF NEEDS Skin   Extensive Assist Verbally Normal                       Personal Care Assistance Level of Assistance  Bathing, Feeding, Dressing Bathing Assistance: Maximum assistance Feeding assistance: Limited assistance Dressing Assistance: Maximum assistance     Functional Limitations Info  Sight, Hearing, Speech Sight Info: Adequate Hearing Info: Adequate Speech Info: Impaired    SPECIAL CARE FACTORS FREQUENCY  PT (By licensed PT), OT (By licensed OT)     PT Frequency: 5x week OT Frequency: 3x week            Contractures Contractures Info: Not present    Additional Factors Info  Code Status, Allergies Code Status Info: Full Allergies Info: Codeine, Compazine, Prednisone, Tetanus Toxoids, Tetracyclines & related, Vicodin           Current Medications (12/06/2022):  This is the current hospital active medication list Current Facility-Administered Medications  Medication Dose Route Frequency Provider Last Rate Last Admin   [START ON 12/07/2022]  stroke: early stages of recovery book   Does not apply Once Manuella Ghazi, Pratik D, DO       acetaminophen (TYLENOL) tablet 650 mg  650 mg Oral Q4H PRN  Manuella Ghazi, Pratik D, DO       Or   acetaminophen (TYLENOL) 160 MG/5ML solution 650 mg  650 mg Per Tube Q4H PRN Manuella Ghazi, Pratik D, DO       Or   acetaminophen (TYLENOL) suppository 650 mg  650 mg Rectal Q4H PRN Manuella Ghazi, Pratik D, DO       amiodarone (PACERONE) tablet 200 mg  200 mg Oral Daily Manuella Ghazi, Pratik D, DO       aspirin suppository 300 mg  300 mg Rectal Daily Manuella Ghazi, Pratik D, DO   300 mg at 12/06/22 1022    hydrALAZINE (APRESOLINE) injection 5 mg  5 mg Intravenous Q4H PRN Lora Havens, MD       labetalol (NORMODYNE) injection 5 mg  5 mg Intravenous Q10 min PRN Lora Havens, MD       levothyroxine (SYNTHROID) tablet 175 mcg  175 mcg Oral QAC breakfast Manuella Ghazi, Pratik D, DO       metoprolol tartrate (LOPRESSOR) tablet 12.5 mg  12.5 mg Oral BID Manuella Ghazi, Pratik D, DO       ondansetron (ZOFRAN) injection 4 mg  4 mg Intravenous Q6H PRN Manuella Ghazi, Pratik D, DO       pantoprazole (PROTONIX) EC tablet 40 mg  40 mg Oral BID Manuella Ghazi, Pratik D, DO       Current Outpatient Medications  Medication Sig Dispense Refill   acetaminophen (TYLENOL) 325 MG tablet Take 2 tablets (650 mg total) by mouth every 6 (six) hours as needed for mild pain. (Patient taking differently: Take 650 mg by mouth every 12 (twelve) hours as needed for mild pain or moderate pain.)     apixaban (ELIQUIS) 5 MG TABS tablet Take 1 tablet (5 mg total) by mouth 2 (two) times daily. Please cancel previous refill order.  Sent to wrong pharmacy. 60 tablet 0   atorvastatin (LIPITOR) 20 MG tablet Take 20 mg by mouth every evening.     Cholecalciferol 50 MCG (2000 UT) TABS Take 2,000 Units by mouth every evening.     levothyroxine (SYNTHROID) 150 MCG tablet Take 150 mcg by mouth in the morning.     melatonin 3 MG TABS tablet Take 3 mg by mouth daily as needed (insomnia).     metoprolol tartrate (LOPRESSOR) 25 MG tablet TAKE 1/2 TABLET 2 TIMES A DAY (Patient taking differently: Take 12.5 mg by mouth 2 (two) times daily.) 30 tablet 1   thiamine (VITAMIN B1) 100 MG tablet Take 100 mg by mouth every evening.     amiodarone (PACERONE) 200 MG tablet TAKE 1 TABLET DAILY (Patient not taking: Reported on 12/06/2022) 30 tablet 1   feeding supplement (ENSURE ENLIVE / ENSURE PLUS) LIQD Take 237 mLs by mouth 3 (three) times daily between meals. (Patient not taking: Reported on 12/08/2021) 237 mL 12   ferrous sulfate 325 (65 FE) MG EC tablet Take 1 tablet (325 mg total) by  mouth 2 (two) times daily. (Patient not taking: Reported on 12/08/2021) 60 tablet 3   ipratropium-albuterol (DUONEB) 0.5-2.5 (3) MG/3ML SOLN Take 3 mLs by nebulization every 2 (two) hours as needed. (Patient not taking: Reported on 12/08/2021) 360 mL    liver oil-zinc oxide (DESITIN) 40 % ointment Apply topically as needed for irritation. (Patient not taking: Reported on 12/08/2021) 56.7 g 0   mirtazapine (REMERON) 7.5 MG tablet Take 1 tablet (7.5 mg total) by mouth at bedtime. (Patient not taking: Reported on 12/06/2022)     Multiple Vitamin (MULTIVITAMIN WITH MINERALS) TABS  tablet Take 1 tablet by mouth daily. (Patient not taking: Reported on 12/08/2021)     OXYGEN Inhale 2 L/min into the lungs as needed (for shortness of breath). (Patient not taking: Reported on 12/06/2022)     pantoprazole (PROTONIX) 40 MG tablet TAKE 1 TABLET 2 TIMES A DAY (Patient not taking: Reported on 12/06/2022) 180 tablet 3   potassium chloride SA (KLOR-CON M) 20 MEQ tablet TAKE 1 TABLET DAILY (Patient not taking: Reported on 12/06/2022) 30 tablet 1   torsemide (DEMADEX) 20 MG tablet TAKE 1 TABLET ONCE A DAY (Patient not taking: Reported on 12/06/2022) 30 tablet 1     Discharge Medications: Please see discharge summary for a list of discharge medications.  Relevant Imaging Results:  Relevant Lab Results:   Additional Information    Shade Flood, LCSW

## 2022-12-06 NOTE — Progress Notes (Signed)
*  PRELIMINARY RESULTS* Echocardiogram 2D Echocardiogram has been performed.  Elpidio Anis 12/06/2022, 12:34 PM

## 2022-12-06 NOTE — ED Triage Notes (Addendum)
Pt arrives [facility] from [facility] for code stroke. LKN approx 0200, per facility pt began to c/o HA about 45 mins PTA.   Pt presents with left sided facial droop, slurred speech, and right sided gaze. Pt currently on Eliquis.

## 2022-12-06 NOTE — Progress Notes (Signed)
Speech Language Pathology Treatment: Dysphagia  Patient Details Name: Lauren [name] MRN: [id number] DOB: 1946-11-14 Today's Date: 12/06/2022 Time: 7893-8101 SLP Time Calculation (min) (ACUTE ONLY): 29 min  Assessment / Plan / Recommendation Clinical Impression  Pt was provided ongoing diagnostic dysphagia therapy this afternoon after BSE was completed this am with limited trials. Pt with improved alertness, ability to follow commands, and verbally respond (continue to note dysarthric speech); Pt did still require intermittent cues to remain alert in between trials. Pt accepted ice chips and thin liquid trials with frequent immediate coughing and LEFT anterior spillage/decreased LEFT bolus containment. Pt consumed NTL trials by tsp and cup sip with suspected delayed swallowing initiation upon palpation, but no coughing or wet vocal quality was noted. Pt consumed puree textures without incident and began to get increasingly lethargic as trials progressed and reports "I'm just so tired". No further trials were offered. Recommend initiate D1/puree diet and NECTAR thick liquids; meds to be administered crushed in puree. Secondary to lethargy MBSS will be completed at a later date. ST will also follow for SLE. Thank you,    HPI HPI: Lauren [name] is a 77 y.o. female with medical history significant for morbid obesity, colon cancer with prior hemicolectomy, chronic combined systolic and diastolic CHF, atrial fibrillation on Eliquis, chronic lymphedema, hypertension, and hypothyroidism who presented to the ED via EMS from her SNF on account of headache that proceeded to left-sided weakness and right gaze deviation.  She was last known normal around 2 AM this morning.  She is currently complaining of some anxiety and denies any headache. BSE and SLE requested      SLP Plan  Continue with current plan of care;MBS      Recommendations for follow up therapy are one component of a multi-disciplinary  discharge planning process, led by the attending physician.  Recommendations may be updated based on patient status, additional functional criteria and insurance authorization.    Recommendations  Diet recommendations: Dysphagia 1 (puree);Nectar-thick liquid Liquids provided via: Cup;Teaspoon Medication Administration: Crushed with puree Supervision: Full supervision/cueing for compensatory strategies Compensations: Minimize environmental distractions;Slow rate;Small sips/bites                Oral Care Recommendations: Oral care QID Follow Up Recommendations: Skilled nursing-short term rehab (<3 hours/day) Assistance recommended at discharge: Frequent or constant Supervision/Assistance SLP Visit Diagnosis: Dysphagia, unspecified (R13.10) Plan: Continue with current plan of care;MBS          Lauren [name] H. Lauren [name], CCC-SLP Speech Language Pathologist  Lauren [name]  12/06/2022, 3:05 PM

## 2022-12-06 NOTE — ED Notes (Signed)
This RN spoke to [name], DSS- pt's guardian. Chance was with pt, yesterday, at [facility]. Pt was ambulatory a month ago for short distances. Pt now uses feet and hands to move around in a wheelchair at the facility. Pt has been non-compliant with medications for the last 2.5 months.   [name], [place]

## 2022-12-06 NOTE — ED Notes (Signed)
52- Code Stroke cart activated, LKW 0200 L facial droop, R gaze, HA. Pt on Eliquis for A-fib. Pt already in CT when code stroke activated.mRs 4-pt WC bound and lives in NH, dependent on personal hygiene and care/feeding. 0431 pt back from CT 0432 TS paged-NCCT neg for bleed 0436 Dr Dutch Gray on camera [phone] NCCT read states "Suspicious dense Right MCA, with rightward gaze strongly suggesting ELVO in this setting"-Dr DePrey made aware. 2417 pt sent back to CT for CTA/P  Letta Kocher, RN Telestroke nurse

## 2022-12-06 NOTE — Progress Notes (Signed)
Code stroke  Call time 403am  Ems in route [place]  [place]

## 2022-12-06 NOTE — ED Notes (Signed)
CODE STROKE PAGED 

## 2022-12-06 NOTE — Progress Notes (Signed)
Pt too lethargic to eat supper.

## 2022-12-06 NOTE — ED Notes (Signed)
Patient transported to MRI 

## 2022-12-06 NOTE — ED Notes (Signed)
[place] [employer] Guardian [phone] ext [phone]

## 2022-12-06 NOTE — ED Notes (Signed)
Dr. Claris Pong, neurologist on stroke telecart

## 2022-12-06 NOTE — ED Notes (Signed)
This RN has tried multiple times to reach [facility] for pt updates and questions. No answer each time

## 2022-12-06 NOTE — ED Notes (Signed)
Pt back from MRI 

## 2022-12-06 NOTE — Consult Note (Signed)
I connected with  Lauren [name] on 12/06/22 by a video enabled telemedicine application and verified that I am speaking with the correct person using two identifiers.   I discussed the limitations of evaluation and management by telemedicine. The patient was unable to expressed understanding due to altered mental status  Location of patient: [facility] Location of physician: [facility]  Neurology Consultation Reason for Consult: Stroke Referring Physician: Dr. Joseph Berkshire  CC: Left-sided weakness  History is obtained from: Chart review as patient is confused  HPI: Lauren [name] is a 77 y.o. female with past medical history of atrial fibrillation on Eliquis, colon cancer with prior hemicolectomy, congestive heart failure, hypertension, hypothyroidism, morbid obesity, nursing home resident who presented with left-sided weakness.  Neurology was consulted for further recommendations.  Last known normal: 12/06/2022 at 2 AM Event happened at nursing home No tPA is on Eliquis No thrombectomy due to poor baseline mRS 4   ROS: Unable to obtain due to altered mental status.   Past Medical History:  Diagnosis Date   Acute cholecystitis 12/06/2012   Chronic combined systolic and diastolic CHF (congestive heart failure) (HCC)    Colon cancer (HCC)    Diverticulosis    Hypotension    BP runs soft   Hypothyroidism    Lymphedema    Morbid obesity ([place])    Obesity    Pancreatitis, acute 46/50/3546   Periumbilical hernia    Permanent atrial fibrillation ([place])     Family History  Problem Relation Age of Onset   Cancer Father        liver   Hypertension Brother    Heart disease Maternal Grandmother    Kidney disease Maternal Grandfather    Diabetes Maternal Grandfather    Kidney failure Brother     Social History:  reports that she has never smoked. She has never used smokeless tobacco. She reports that she does not drink alcohol and does not use  drugs.  Exam: Current vital signs: BP (!) 120/104   Pulse 70   Temp 97.8 F (36.6 C) (Axillary)   Resp 20   Wt 111.1 kg   LMP 09/13/2016   SpO2 91%   BMI 44.81 kg/m  Vital signs in last 24 hours: Temp:  [97.7 F (36.5 C)-98.6 F (37 C)] 97.8 F (36.6 C) (01/12 1009) Pulse Rate:  [70-117] 70 (01/12 1027) Resp:  [18-27] 20 (01/12 1027) BP: (106-146)/(79-117) 120/104 (01/12 1027) SpO2:  [91 %-100 %] 91 % (01/12 1027) Weight:  [111.1 kg] 111.1 kg (01/12 0545)   Physical Exam  Constitutional: Laying in bed, not in apparent distress Neuro: Opens eyes overstimulation, able to tell me her name but speech is dysarthric, follows simple one-step commands at times but keeps falling asleep, PERRLA, left-sided facial droop, EOMI, left-sided hemineglect with right gaze preference, antigravity strength in right upper and lower extremity without drift, does not move left upper and left lower extremity, unable to assess coordination as patient could not follow complex commands  NIHSS 18  1A: Level of consciousness --> 1 = Arouses to minor stimulation 1B: Ask month and age --> 1 = 1 question right 1C: 'Blink eyes' & 'squeeze hands' --> 0 = Performs both tasks 2: Horizontal extraocular movements --> 1 = Partial gaze palsy: can be overcome 3: Visual fields --> 2 = Complete hemianopia 4: Facial palsy --> 2 = Partial paralysis (lower face) 5A: Left arm motor drift --> 4 = No movement 5B: Right arm  motor drift --> 0 = No drift for 10 seconds 6A: Left leg motor drift --> 3 = No effort against gravity 6B: Right leg motor drift --> 1 = Drift, but doesn't hit bed 7: Limb Ataxia --> 0 = Does not understand 8: Sensation --> 0 = Normal; no sensory loss 9: Language/aphasia --> 0 = Normal; no aphasia 10: Dysarthria --> 1 = Mild-moderate dysarthria: slurring but can be understood 11: Extinction/inattention --> 2 = Profound hemi-inattention (ex: does not recognize own hand)   I have reviewed labs in  epic and the results pertinent to this consultation are: CBC:  Recent Labs  Lab 12/06/22 0423 12/06/22 0438  WBC 7.9  --   NEUTROABS 4.0  --   HGB 14.1 15.3*  HCT 44.9 45.0  MCV 91.1  --   PLT 216  --     Basic Metabolic Panel:  Lab Results  Component Value Date   NA 137 12/06/2022   K 3.7 12/06/2022   CO2 23 12/06/2022   GLUCOSE 118 (H) 12/06/2022   BUN 16 12/06/2022   CREATININE 0.80 12/06/2022   CALCIUM 8.9 12/06/2022   GFRNONAA >60 12/06/2022   GFRAA >90 03/28/2014   Lipid Panel:  Lab Results  Component Value Date   LDLCALC 89 12/05/2012   HgbA1c:  Lab Results  Component Value Date   HGBA1C 5.4 07/29/2021   Urine Drug Screen: No results found for: "LABOPIA", "COCAINSCRNUR", "LABBENZ", "AMPHETMU", "THCU", "LABBARB"  Alcohol Level     Component Value Date/Time   ETH <10 12/06/2022 0423     I have reviewed the images obtained: CT head without contrast 12/06/2022: 1. Suspicious dense Right MCA, with rightward gaze strongly suggesting ELVO in this setting. ASPECTS 10. No acute cortically based infarct or intracranial hemorrhage identified. Acute paranasal sinusitis with retained secretions in the pharynx.  CTA head and neck with and without contrast 12/06/2022: 1. Positive for ELVO: Right MCA M1 occluded just distal to the anterior temporal artery origin.  Subsequent CT Perfusion parameters favorable to reperfusion: No infarct core detected (CBF less than 30%), concordant with normal ASPECTS. And 108 mL of right MCA territory penumbra with hypoperfusion index of 0.5. Underlying mild for age atherosclerosis in the head or neck.  MRI brain without contrast 12/06/2022: Acute infarct in the right lentiform nucleus extending into the corona radiata. No evidence of hemorrhagic transformation or significant mass effect.    ASSESSMENT/PLAN: 78 year old female with history of atrial fibrillation on Eliquis who presented with left-sided weakness.  MRI brain showed acute  infarction right lentiform nucleus extending into coronary radiata.  Acute ischemic stroke, right lentiform nucleus and corona radiator -Likely cardioembolic. TTE pending  Recommendations: - If TTE doesn't show any thrombus, recommend holding anti-coagulation for 7 days. The start Pradaxa '150mg'$  BID - recommend ASA '325mg'$  daily in the interim - LDL pending, if >70, recommend starting atorvastatin '40mg'$  daily - please obtain stat CT head for worsening exam - goal BP: permissive HTN for 24 hours. PRN Labetelol or hydralazine if Blood pressure is greater than 220/120. Recommend 15% reduction in BP after the first 24 hours with eventual goal normotension - failed swallow eval. Palliative care consult pending - If continues to fail swallow and legal guardian choses to continue care, can use IV heparin as a bridge before resuming oral anticoagulation while patient gets feeding tube - NIHSS per stroke protocol - PT/OT - Follow up with neuro in 3 months  Thank you for allowing [place] to participate in the care  of this patient. If you have any further questions, please contact  me or neurohospitalist.   Z[facility] neurohospitalist

## 2022-12-06 NOTE — H&P (Addendum)
History and Physical    Lauren [name] KNL:[id number] DOB: 06-28-1946 DOA: 12/06/2022  PCP: Nanine Means   Patient coming from: SNF  Chief Complaint: Headache and L sided weakness  HPI: Lauren [name] is a 77 y.o. female with medical history significant for morbid obesity, colon cancer with prior hemicolectomy, chronic combined systolic and diastolic CHF, atrial fibrillation on Eliquis, chronic lymphedema, hypertension, and hypothyroidism who presented to the ED via EMS from her SNF on account of headache that proceeded to left-sided weakness and right gaze deviation.  She was last known normal around 2 AM this morning.  She is currently complaining of some anxiety and denies any headache.   ED Course: Vital signs with elevated heart rate and atrial fibrillation as well as elevated blood pressure readings noted.  Patient was seen by neurology and tPA was not recommended.  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  She is also not a candidate for endovascular intervention due to poor baseline functional status.  Review of Systems: Could not be obtained given patient condition.  Past Medical History:  Diagnosis Date   Acute cholecystitis 12/06/2012   Chronic combined systolic and diastolic CHF (congestive heart failure) (HCC)    Colon cancer (HCC)    Diverticulosis    Hypotension    BP runs soft   Hypothyroidism    Lymphedema    Morbid obesity ([place])    Obesity    Pancreatitis, acute 79/12/4095   Periumbilical hernia    Permanent atrial fibrillation [facility])     Past Surgical History:  Procedure Laterality Date   BIOPSY  07/05/2021   Procedure: BIOPSY;  Surgeon: [name], DO;  [facility];  Service: Endoscopy;;   CHOLECYSTECTOMY N/A 03/25/2014   Procedure: LAPAROSCOPIC CHOLECYSTECTOMY WITH INTRAOPERATIVE CHOLANGIOGRAM;  Surgeon: Ralene Ok, MD;  Location: [place];  Service: General;  Laterality: N/A;   COLONOSCOPY WITH PROPOFOL  N/A 07/05/2021   Procedure: COLONOSCOPY WITH PROPOFOL;  Surgeon: [name], DO;  [facility];  Service: Endoscopy;  Laterality: N/A;   ESOPHAGOGASTRODUODENOSCOPY (EGD) WITH PROPOFOL N/A 07/05/2021   Procedure: ESOPHAGOGASTRODUODENOSCOPY (EGD) WITH PROPOFOL;  Surgeon: [name], DO;  [facility];  Service: Endoscopy;  Laterality: N/A;   PARTIAL COLECTOMY N/A 07/06/2021   Procedure: RIGHT HEMICOLECTOMY;  Surgeon: [name] Signs, MD;  Location: [facility];  Service: General;  Laterality: N/A;   POLYPECTOMY  07/05/2021   Procedure: POLYPECTOMY;  Surgeon: [name], DO;  [facility];  Service: Endoscopy;;     reports that she has never smoked. She has never used smokeless tobacco. She reports that she does not drink alcohol and does not use drugs.  Allergies  Allergen Reactions   Codeine Itching and Other (See Comments)    "Allergic," per [facility]   Compazine [Prochlorperazine Edisylate] Other (See Comments)    "Allergic," per [facility]   Prednisone Nausea And Vomiting and Other (See Comments)    "Allergic," per [facility]   Tetanus Toxoids Other (See Comments)    Knot like "hen's egg" came up and "Allergic," per [facility]   Vicodin [Hydrocodone-Acetaminophen] Itching and Other (See Comments)    "Allergic," per [facility]    Family History  Problem Relation Age of Onset   Cancer Father        liver   Hypertension Brother    Heart disease Maternal Grandmother    Kidney disease Maternal Grandfather    Diabetes Maternal Grandfather  Kidney failure Brother     Prior to Admission medications   Medication Sig Start Date End Date Taking? Authorizing Provider  acetaminophen (TYLENOL) 325 MG tablet Take 2 tablets (650 mg total) by mouth every 6 (six) hours as needed for mild pain. Patient not taking: Reported on 12/08/2021 09/03/21   Samella Parr, NP  amiodarone (PACERONE) 200 MG tablet TAKE 1 TABLET DAILY 02/25/22   Arnoldo Lenis, MD  apixaban (ELIQUIS) 5 MG  TABS tablet Take 1 tablet (5 mg total) by mouth 2 (two) times daily. Please cancel previous refill order.  Sent to wrong pharmacy. 01/15/22   Arnoldo Lenis, MD  cetirizine (ZYRTEC) 10 MG tablet Take 10 mg by mouth daily as needed (for itching).    [provider]  Cholecalciferol (VITAMIN D3) 25 MCG (1000 UT) CAPS Take 1,000 Units by mouth in the morning.    [provider]  feeding supplement (ENSURE ENLIVE / ENSURE PLUS) LIQD Take 237 mLs by mouth 3 (three) times daily between meals. Patient not taking: Reported on 12/08/2021 09/03/21   Samella Parr, NP  ferrous sulfate 325 (65 FE) MG EC tablet Take 1 tablet (325 mg total) by mouth 2 (two) times daily. Patient not taking: Reported on 12/08/2021 07/17/21 07/17/22  Kathie Dike, MD  ipratropium-albuterol (DUONEB) 0.5-2.5 (3) MG/3ML SOLN Take 3 mLs by nebulization every 2 (two) hours as needed. Patient not taking: Reported on 12/08/2021 09/03/21   Samella Parr, NP  levothyroxine (SYNTHROID) 175 MCG tablet Take 175 mcg by mouth daily before breakfast.    [provider]  liver oil-zinc oxide (DESITIN) 40 % ointment Apply topically as needed for irritation. Patient not taking: Reported on 12/08/2021 09/03/21   Samella Parr, NP  metoprolol tartrate (LOPRESSOR) 25 MG tablet TAKE 1/2 TABLET 2 TIMES A DAY 02/25/22   Arnoldo Lenis, MD  mirtazapine (REMERON) 7.5 MG tablet Take 1 tablet (7.5 mg total) by mouth at bedtime. 09/03/21   Samella Parr, NP  Multiple Vitamin (MULTIVITAMIN WITH MINERALS) TABS tablet Take 1 tablet by mouth daily. Patient not taking: Reported on 12/08/2021 09/07/21   Samella Parr, NP  OXYGEN Inhale 2 L/min into the lungs as needed (for shortness of breath).    [provider]  pantoprazole (PROTONIX) 40 MG tablet TAKE 1 TABLET 2 TIMES A DAY 01/14/22   Annitta Needs, NP  potassium chloride SA (KLOR-CON M) 20 MEQ tablet TAKE 1 TABLET DAILY 02/25/22   Arnoldo Lenis, MD   torsemide (DEMADEX) 20 MG tablet TAKE 1 TABLET ONCE A DAY 02/25/22   Arnoldo Lenis, MD    Physical Exam: Vitals:   12/06/22 0627 12/06/22 0630 12/06/22 0700 12/06/22 0722  BP:  (!) 144/117 (!) 130/98   Pulse:  (!) 117    Resp:  (!) '27 19 18  '$ Temp: 97.7 F (36.5 C)     TempSrc: Axillary     SpO2:  99% 100%   Weight:        Constitutional: Appears anxious, obese with lymphedema Vitals:   12/06/22 0627 12/06/22 0630 12/06/22 0700 12/06/22 0722  BP:  (!) 144/117 (!) 130/98   Pulse:  (!) 117    Resp:  (!) '27 19 18  '$ Temp: 97.7 F (36.5 C)     TempSrc: Axillary     SpO2:  99% 100%   Weight:       Eyes: lids and conjunctivae normal Neck: normal, supple Respiratory: clear to auscultation  bilaterally. Normal respiratory effort. No accessory muscle use.  Cardiovascular: Irregular and tachycardic Abdomen: no tenderness, no distention. Bowel sounds positive.  Musculoskeletal: Chronic lymphedema to lower extremities Skin: no rashes, lesions, ulcers.  Psychiatric: Flat affect  Labs on Admission: I have personally reviewed following labs and imaging studies  CBC: Recent Labs  Lab 12/06/22 0423 12/06/22 0438  WBC 7.9  --   NEUTROABS 4.0  --   HGB 14.1 15.3*  HCT 44.9 45.0  MCV 91.1  --   PLT 216  --    Basic Metabolic Panel: Recent Labs  Lab 12/06/22 0423 12/06/22 0438  NA 134* 137  K 3.6 3.7  CL 99 102  CO2 23  --   GLUCOSE 115* 118*  BUN 16 16  CREATININE 0.91 0.80  CALCIUM 8.9  --    GFR: CrCl cannot be calculated (Unknown ideal weight.). Liver Function Tests: Recent Labs  Lab 12/06/22 0423  AST 22  ALT 13  ALKPHOS 68  BILITOT 0.7  PROT 7.6  ALBUMIN 3.7   No results for input(s): "LIPASE", "AMYLASE" in the last 168 hours. No results for input(s): "AMMONIA" in the last 168 hours. Coagulation Profile: Recent Labs  Lab 12/06/22 0423  INR 1.1   Cardiac Enzymes: No results for input(s): "CKTOTAL", "CKMB", "CKMBINDEX", "TROPONINI" in the last  168 hours. BNP (last 3 results) No results for input(s): "PROBNP" in the last 8760 hours. HbA1C: No results for input(s): "HGBA1C" in the last 72 hours. CBG: Recent Labs  Lab 12/06/22 0424  GLUCAP 118*   Lipid Profile: No results for input(s): "CHOL", "HDL", "LDLCALC", "TRIG", "CHOLHDL", "LDLDIRECT" in the last 72 hours. Thyroid Function Tests: No results for input(s): "TSH", "T4TOTAL", "FREET4", "T3FREE", "THYROIDAB" in the last 72 hours. Anemia Panel: No results for input(s): "VITAMINB12", "FOLATE", "FERRITIN", "TIBC", "IRON", "RETICCTPCT" in the last 72 hours. Urine analysis:    Component Value Date/Time   COLORURINE YELLOW 12/08/2021 1310   APPEARANCEUR CLEAR 12/08/2021 1310   LABSPEC 1.020 12/08/2021 1310   PHURINE 6.0 12/08/2021 1310   GLUCOSEU NEGATIVE 12/08/2021 1310   HGBUR NEGATIVE 12/08/2021 1310   BILIRUBINUR SMALL (A) 12/08/2021 1310   KETONESUR NEGATIVE 12/08/2021 1310   PROTEINUR NEGATIVE 12/08/2021 1310   UROBILINOGEN 0.2 03/26/2014 1346   NITRITE NEGATIVE 12/08/2021 1310   LEUKOCYTESUR NEGATIVE 12/08/2021 1310    Radiological Exams on Admission: CT ANGIO HEAD NECK W WO CM W PERF (CODE STROKE)  Result Date: 12/06/2022 CLINICAL DATA:  77 year old female code stroke presentation with left side flaccid, rightward gaze, hyperdense MCA on plain head CT. EXAM: CT ANGIOGRAPHY HEAD AND NECK CT PERFUSION BRAIN TECHNIQUE: Multidetector CT imaging of the head and neck was performed using the standard protocol during bolus administration of intravenous contrast. Multiplanar CT image reconstructions and MIPs were obtained to evaluate the vascular anatomy. Carotid stenosis measurements (when applicable) are obtained utilizing NASCET criteria, using the distal internal carotid diameter as the denominator. Multiphase CT imaging of the brain was performed following IV bolus contrast injection. Subsequent parametric perfusion maps were calculated using RAPID software. RADIATION  DOSE REDUCTION: This exam was performed according to the departmental dose-optimization program which includes automated exposure control, adjustment of the mA and/or kV according to patient size and/or use of iterative reconstruction technique. CONTRAST:  140m OMNIPAQUE IOHEXOL 350 MG/ML SOLN 100 mL Omnipaque 350 COMPARISON:  Plain head CT today 0428 hours. FINDINGS: CT Brain Perfusion Findings: ASPECTS: 10 CBF (<30%) Volume: 074mPerfusion (Tmax>6.0s) volume: 10834merritory. Hypoperfusion index 0.5 (59  mL of T-max > 10 S). Mismatch Volume: 127m Infarction Location:Right MCA CTA NECK Skeleton: Carious dentition. Cervical spine degeneration. No acute osseous abnormality identified. Upper chest: Patchy peribronchial ground-glass opacity in the right upper lobe, multifocal. Mild reactive appearing superior mediastinal lymph nodes. Visible major airways are patent. Other visible pulmonary lobes appear spared. Other neck: Retropharyngeal course of both carotids. Mild retained secretions in the nasopharynx. Paranasal sinus fluid and mucosal thickening. Rightward gaze. Aortic arch: 3 vessel arch configuration.  No arch atherosclerosis. Right carotid system: Brachiocephalic artery, right CCA and right carotid bifurcation are negative. Mild if any soft plaque at the right ICA origin. No stenosis to the skull base. Left carotid system: Negative aside from a retropharyngeal course. Vertebral arteries: Mild proximal subclavian artery plaque without stenosis. Patent vertebral arteries to the skull base with no stenosis, the left is mildly dominant. CTA HEAD Posterior circulation: Mildly dominant left V4 segment. Patent vertebrobasilar junction with no distal vertebral stenosis. PICA origins appear normal. Patent basilar artery, SCA and PCA origins. Right posterior communicating artery is present, left diminutive or absent. Bilateral PCA branches are within normal limits. Fetal type right PCA origin suspected rather than right  PCA origin stenosis on series 10, image 28. Anterior circulation: Both ICA siphons are patent. Calcified plaque more pronounced on the left. Only mild left siphon stenosis. No significant right siphon stenosis. Patent carotid termini, MCA and ACA origins. Diminutive anterior communicating artery. Bilateral ACA branches are within normal limits. Right MCA M1 occlusion about 8 mm from its origin on series 9 image 15. This is just distal to the anterior temporal artery origin. There is some right M2 branch reconstitution. Left MCA M1 segment and bifurcation are patent without stenosis. Left MCA branches are within normal limits. Venous sinuses: Early contrast timing. Superior sagittal sinus appears patent. Anatomic variants: Mildly dominant left vertebral artery, mildly fetal type right PCA origin. Review of the MIP images confirms the above findings IMPRESSION: 1. Positive for ELVO: Right MCA M1 occluded just distal to the anterior temporal artery origin. This was discussed by telephone with Dr. CJoseph Berkshireon 12/06/2022 at 0501 hours. 2. Subsequent CT Perfusion parameters favorable to reperfusion: No infarct core detected (CBF less than 30%), concordant with normal ASPECTS. And 108 mL of right MCA territory penumbra with hypoperfusion index of 0.5. 3. Underlying mild for age atherosclerosis in the head or neck. 4. Multifocal right upper lobe patchy peribronchial ground-glass opacity. This might be mild aspiration in this setting. Otherwise consider developing Bronchopneumonia. Left upper lung appears clear. Electronically Signed   By: HGenevie AnnM.D.   On: 12/06/2022 05:10   CT HEAD CODE STROKE WO CONTRAST  Result Date: 12/06/2022 CLINICAL DATA:  Code stroke. 77year old female with slurred speech, left facial droop, right gaze. EXAM: CT HEAD WITHOUT CONTRAST TECHNIQUE: Contiguous axial images were obtained from the base of the skull through the vertex without intravenous contrast. RADIATION DOSE REDUCTION:  This exam was performed according to the departmental dose-optimization program which includes automated exposure control, adjustment of the mA and/or kV according to patient size and/or use of iterative reconstruction technique. COMPARISON:  Head CT 06/14/2022.  Brain MRI 06/10/2022. FINDINGS: Brain: Stable cerebral volume. No midline shift, mass effect, or evidence of intracranial mass lesion. No ventriculomegaly. Patchy asymmetric white matter hypodensity greater in the left hemisphere appears stable. No cortically based acute infarct identified. However, there is suspicious hyperdensity of the right MCA. No cytotoxic edema identified. Vascular: Calcified atherosclerosis at the skull base. Skull:  Stable hyperostosis. No acute osseous abnormality identified. Sinuses/Orbits: Widespread paranasal sinus bubbly opacity, fluid levels. Retained secretions in the visible pharynx. Tympanic cavities and mastoids remain clear. Other: Rightward gaze. Visualized scalp soft tissues are within normal limits. ASPECTS [facility] Stroke Program Early CT Score) Total score (0-10 with 10 being normal): 10 IMPRESSION: 1. Suspicious dense Right MCA, with rightward gaze strongly suggesting ELVO in this setting. 2. ASPECTS 10. No acute cortically based infarct or intracranial hemorrhage identified. 3. Acute paranasal sinusitis with retained secretions in the pharynx. Electronically Signed   By: Genevie Ann M.D.   On: 12/06/2022 04:36    EKG: Independently reviewed.  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 Not a candidate for tPA due to use of Eliquis Brain MRI pending Obtain 2D echocardiogram Lipid panel hemoglobin A1c PT/OT/SLP evaluation Allow permissive hypertension Aspirin 300 mg per rectal daily Appreciate neurology recommendations Long-term prognosis suspected to be poor and palliative to be consulted for goals of care discussion   Atrial fibrillation with mild RVR Continue oral amiodarone and consider infusion as needed Monitor in stepdown unit Continue metoprolol Holding Eliquis for now pending review of MRI  Hypothyroidism Continue levothyroxine  Hypertension Permissive hypertension Continue metoprolol to help control heart rate otherwise  Chronic systolic/diastolic CHF Hold torsemide until off n.p.o. status Will obtain 2D echocardiogram while here Currently appears euvolemic  Anxiety/depression Ativan to be ordered as needed  Chronic lymphedema Disabled as a result  Colon cancer Prior right hemicolectomy 06/2021  Morbid obesity BMI 44.81   DVT prophylaxis: Hold Eliquis for now Code Status: Full Family Communication: Tried calling guardian with no response Disposition Plan:Admit for CVA evaluation Consults called:Neurology, palliative Admission status: Obs, SDU  Severity of Illness: The appropriate patient status for this patient is OBSERVATION. Observation status is judged to be reasonable and necessary in order to provide the required intensity of service to ensure the patient's safety. The patient's presenting symptoms, physical exam findings, and initial radiographic and laboratory data in the context of their medical condition is felt to place them at decreased risk for further clinical deterioration. Furthermore, it is anticipated that the patient will be medically stable for discharge from the hospital within 2 midnights of admission.    Kreston Ahrendt D Manuella Ghazi DO [facility]  If 7PM-7AM, please contact  night-coverage [email or web address]  12/06/2022, 7:41 AM

## 2022-12-06 NOTE — ED Notes (Signed)
PT at bedside, pt typically wheelchair bound--However, legal guardian stated she could walk short distances to the [place], but unable to do so since this visit due to L sided weakness

## 2022-12-06 NOTE — Plan of Care (Signed)
  Problem: Acute Rehab OT Goals (only OT should resolve) Goal: Pt. Will Perform Eating Flowsheets (Taken 12/06/2022 1003) Pt Will Perform Eating:  with modified independence  sitting Goal: Pt. Will Perform Grooming Flowsheets (Taken 12/06/2022 1003) Pt Will Perform Grooming:  with min guard assist  with min assist  sitting Goal: Pt. Will Perform Upper Body Bathing Flowsheets (Taken 12/06/2022 1003) Pt Will Perform Upper Body Bathing:  with min guard assist  with min assist  sitting Goal: Pt. Will Perform Lower Body Bathing Flowsheets (Taken 12/06/2022 1003) Pt Will Perform Lower Body Bathing:  with mod assist  sitting/lateral leans  with adaptive equipment Goal: Pt. Will Perform Upper Body Dressing Flowsheets (Taken 12/06/2022 1003) Pt Will Perform Upper Body Dressing:  with min assist  with min guard assist  sitting Goal: Pt. Will Perform Lower Body Dressing Flowsheets (Taken 12/06/2022 1003) Pt Will Perform Lower Body Dressing:  with mod assist  sitting/lateral leans  with adaptive equipment Goal: Pt. Will Transfer To Toilet Flowsheets (Taken 12/06/2022 1003) Pt Will Transfer to Toilet:  squat pivot transfer  with mod assist Goal: Pt/Caregiver Will Perform Home Exercise Program Flowsheets (Taken 12/06/2022 1003) Pt/caregiver will Perform Home Exercise Program:  Increased ROM  Increased strength  Left upper extremity  With minimal assist Goal: OT Additional ADL Goal #1 Flowsheets (Taken 12/06/2022 1003) Additional ADL Goal #1: Pt will demonstrate improved visual motor and perceptual skills by identifying objects in L field of view with minimal cuing 50% of attempts.  Toivo Bordon OT, MOT

## 2022-12-06 NOTE — ED Notes (Signed)
Neurotele cart at bedside for assessment

## 2022-12-06 NOTE — ED Notes (Signed)
MRI stated unable to get in touch with legal guardian for screening questions

## 2022-12-06 NOTE — Evaluation (Signed)
Occupational Therapy Evaluation Patient Details Name: Lauren [name] MRN: [id number] DOB: 07/17/1946 Today's Date: 12/06/2022   History of Present Illness Lauren [name] is a 77 y.o. female with medical history significant for morbid obesity, colon cancer with prior hemicolectomy, chronic combined systolic and diastolic CHF, atrial fibrillation on Eliquis, chronic lymphedema, hypertension, and hypothyroidism who presented to the ED via EMS from her SNF on account of headache that proceeded to left-sided weakness and right gaze deviation.  She was last known normal around 2 AM this morning.  She is currently complaining of some anxiety and denies any headache. (Per DO)   Clinical Impression   Pt agreeable to OT and PT co-evaluation. Pt noted to have some L side facial droop and flexor tone with L UE. Severe L UE deficits noted with pt reported that all issues were new. Pt required mod to max A for pull to sit at EOB with poor seated balance. Unable to attempt standing due to high bed in emergency room combined with pt's poor sitting balance. Pt also presents with severe L side visual impairment and poor visual tracking. Pt noted to become more lethargic after attempt at visual tracking. Pt will benefit from continued OT in the hospital and recommended venue below to increase strength, balance, and endurance for safe ADL's.        Recommendations for follow up therapy are one component of a multi-disciplinary discharge planning process, led by the attending physician.  Recommendations may be updated based on patient status, additional functional criteria and insurance authorization.   Follow Up Recommendations  Skilled nursing-short term rehab (<3 hours/day)     Assistance Recommended at Discharge Frequent or constant Supervision/Assistance  Patient can return home with the following Two people to help with walking and/or transfers;A lot of help with bathing/dressing/bathroom;Assistance with  cooking/housework;Assistance with feeding;Assist for transportation;Help with stairs or ramp for entrance    Functional Status Assessment  Patient has had a recent decline in their functional status and demonstrates the ability to make significant improvements in function in a reasonable and predictable amount of time.  Equipment Recommendations  None recommended by OT           Precautions / Restrictions Precautions Precautions: Fall Restrictions Weight Bearing Restrictions: No      Mobility Bed Mobility Overal bed mobility: Needs Assistance Bed Mobility: Supine to Sit, Sit to Supine     Supine to sit: Mod assist, Max assist Sit to supine: Max assist   General bed mobility comments: Much assist needed; little active movement of L UE or LE.    Transfers                          Balance Overall balance assessment: Needs assistance Sitting-balance support: Single extremity supported, Feet supported Sitting balance-Leahy Scale: Poor Sitting balance - Comments: Seated EOB Postural control: Posterior lean                                 ADL either performed or assessed with clinical judgement   ADL Overall ADL's : Needs assistance/impaired     Grooming: Moderate assistance;Bed level   Upper Body Bathing: Maximal assistance;Bed level   Lower Body Bathing: Maximal assistance;Bed level   Upper Body Dressing : Maximal assistance;Bed level   Lower Body Dressing: Maximal assistance;Bed level   Toilet Transfer: Total assistance;Maximal assistance Toilet Transfer Details (indicate cue type  and reason): Unable to attempt due to poor sitting balance. Toileting- Clothing Manipulation and Hygiene: Total assistance;Bed level       Functional mobility during ADLs: Maximal assistance;Total assistance General ADL Comments: Evaluation limited to bedside sitting due to poor sitting balance.     Vision Baseline Vision/History: 1 Wears  glasses Ability to See in Adequate Light: 0 Adequate Patient Visual Report: No change from baseline Vision Assessment?: Yes Alignment/Gaze Preference: Gaze right;Head turned Tracking/Visual Pursuits: Other (comment) (Good L to R tracking; poor vertical. Pt noted to close eyes and become lethargice when attempting vertical traking in lower quadrants.) Visual Fields: Left homonymous hemianopsia;Other (comment) Additional Comments: Very severe L neglect.                Pertinent Vitals/Pain Pain Assessment Pain Assessment: Faces Faces Pain Scale: Hurts a little bit Pain Location: L ear and throat. Pain Descriptors / Indicators: Sore Pain Intervention(s): Limited activity within patient's tolerance, Monitored during session, Repositioned     Hand Dominance Right   Extremity/Trunk Assessment Upper Extremity Assessment Upper Extremity Assessment: LUE deficits/detail;RUE deficits/detail RUE Deficits / Details: Generally weak. LUE Deficits / Details: Moderate flexor tone. Unable to reach full P/ROM of shoulder due to tone. ~50% of available flexion and abduction of shoulder. Able to achieve full P/ROM of elbow extension and wrist extension. Much assist for composit extension with moderate to maximal flexor tone. LUE Sensation: decreased light touch;decreased proprioception LUE Coordination: decreased fine motor;decreased gross motor   Lower Extremity Assessment Lower Extremity Assessment: Defer to PT evaluation       Communication Communication Communication: Expressive difficulties (slurred speech)   Cognition Arousal/Alertness: Awake/alert Behavior During Therapy: WFL for tasks assessed/performed Overall Cognitive Status: Within Functional Limits for tasks assessed                                                        Home Living Family/patient expects to be discharged to:: Skilled nursing facility                                         Prior Functioning/Environment Prior Level of Function : Needs assist       Physical Assist : Mobility (physical);ADLs (physical) Mobility (physical): Transfers;Gait ADLs (physical): IADLs Mobility Comments: Chart review indicates pt does not get up without assist. [name] guardianship worker states that pt is able to ambulte to the bathroom with RW. Pt confirmed this as well. ADLs Comments: Pt reports indepnedence for ADL's. Assist for IADL's by SNF staff.        OT Problem List: Decreased strength;Decreased range of motion;Decreased activity tolerance;Impaired balance (sitting and/or standing);Impaired vision/perception;Decreased coordination;Impaired sensation;Impaired tone;Obesity;Impaired UE functional use      OT Treatment/Interventions: Self-care/ADL training;Therapeutic exercise;Therapeutic activities;Visual/perceptual remediation/compensation;Patient/family education;Balance training;Neuromuscular education    OT Goals(Current goals can be found in the care plan section) Acute Rehab OT Goals Patient Stated Goal: Get stronger at rehab. OT Goal Formulation: With patient Time For Goal Achievement: 12/20/22 Potential to Achieve Goals: Fair  OT Frequency: Min 2X/week    Co-evaluation PT/OT/SLP Co-Evaluation/Treatment: Yes Reason for Co-Treatment: Complexity of the patient's impairments (multi-system involvement)   OT goals addressed during session: ADL's and self-care      AM-PAC OT "6  Clicks" Daily Activity     Outcome Measure Help from another person eating meals?: A Little Help from another person taking care of personal grooming?: A Lot Help from another person toileting, which includes using toliet, bedpan, or urinal?: Total Help from another person bathing (including washing, rinsing, drying)?: A Lot Help from another person to put on and taking off regular upper body clothing?: A Lot Help from another person to put on and taking off regular lower body clothing?: A  Lot 6 Click Score: 12   End of Session Nurse Communication: Mobility status;Other (comment) (Notified that pt became lethargic.)  Activity Tolerance: Patient tolerated treatment well Patient left: in bed;with call bell/phone within reach  OT Visit Diagnosis: Unsteadiness on feet (R26.81);Other abnormalities of gait and mobility (R26.89);Muscle weakness (generalized) (M62.81);Other symptoms and signs involving the nervous system (R29.898);Hemiplegia and hemiparesis Hemiplegia - Right/Left: Left Hemiplegia - dominant/non-dominant: Non-Dominant Hemiplegia - caused by: Cerebral infarction                Time: 0272-5366 OT Time Calculation (min): 20 min Charges:  OT General Charges $OT Visit: 1 Visit OT Evaluation $OT Eval Low Complexity: 1 Low  Winn Muehl OT, MOT  Larey Seat 12/06/2022, 9:59 AM

## 2022-12-06 NOTE — Plan of Care (Signed)
  Problem: Acute Rehab PT Goals(only PT should resolve) Goal: Pt Will Go Supine/Side To Sit Outcome: Progressing Flowsheets (Taken 12/06/2022 1132) Pt will go Supine/Side to Sit: with moderate assist Goal: Patient Will Transfer Sit To/From Stand Outcome: Progressing Flowsheets (Taken 12/06/2022 1132) Patient will transfer sit to/from stand: with moderate assist Goal: Pt Will Transfer Bed To Chair/Chair To Bed Outcome: Progressing Flowsheets (Taken 12/06/2022 1132) Pt will Transfer Bed to Chair/Chair to Bed:  with mod assist  with max assist Goal: Pt Will Ambulate Outcome: Progressing Flowsheets (Taken 12/06/2022 1132) Pt will Ambulate:  10 feet  with moderate assist  with maximum assist  with rolling walker   11:32 AM, 12/06/22 Lonell Grandchild, MPT Physical Therapist with [facility] [phone] office [phone] mobile phone

## 2022-12-07 DIAGNOSIS — I639 Cerebral infarction, unspecified: Secondary | ICD-10-CM | POA: Diagnosis not present

## 2022-12-07 LAB — LIPID PANEL
Cholesterol: 154 mg/dL (ref 0–200)
HDL: 58 mg/dL (ref >40)
LDL Cholesterol: 79 mg/dL (ref 0–99)
Total CHOL/HDL Ratio: 2.7 RATIO
Triglycerides: 84 mg/dL (ref <150)
VLDL: 17 mg/dL (ref 0–40)

## 2022-12-07 LAB — BASIC METABOLIC PANEL
Anion gap: 8 (ref 5–15)
BUN: 11 mg/dL (ref 8–23)
CO2: 23 mmol/L (ref 22–32)
Calcium: 8.4 mg/dL — ABNORMAL LOW (ref 8.9–10.3)
Chloride: 105 mmol/L (ref 98–111)
Creatinine, Ser: 0.72 mg/dL (ref 0.44–1.00)
GFR, Estimated: >60 mL/min (ref >60)
Glucose, Bld: 95 mg/dL (ref 70–99)
Potassium: 4.1 mmol/L (ref 3.5–5.1)
Sodium: 136 mmol/L (ref 135–145)

## 2022-12-07 LAB — HEMOGLOBIN A1C
Hgb A1c MFr Bld: 5 % (ref 4.8–5.6)
Mean Plasma Glucose: 97 mg/dL

## 2022-12-07 LAB — MAGNESIUM: Magnesium: 2 mg/dL (ref 1.7–2.4)

## 2022-12-07 MED ORDER — ASPIRIN 325 MG PO TABS
325.0000 mg | ORAL_TABLET | Freq: Every day | ORAL | Status: DC
  Administered 2022-12-07 – 2022-12-09 (×3): 325 mg via ORAL
  Filled 2022-12-07 (×3): qty 1

## 2022-12-07 MED ORDER — ATORVASTATIN CALCIUM 40 MG PO TABS
40.0000 mg | ORAL_TABLET | Freq: Every day | ORAL | Status: DC
  Administered 2022-12-07 – 2022-12-09 (×3): 40 mg via ORAL
  Filled 2022-12-07 (×3): qty 1

## 2022-12-07 NOTE — Progress Notes (Signed)
PROGRESS NOTE    Lauren [name]  ZWC:[id number] DOB: 03-08-1946 DOA: 12/06/2022 PCP: Nanine Means   Brief Narrative:    Lauren [name] is a 77 y.o. female with medical history significant for morbid obesity, colon cancer with prior hemicolectomy, chronic combined systolic and diastolic CHF, atrial fibrillation on Eliquis, chronic lymphedema, hypertension, and hypothyroidism who presented to the ED via EMS from her SNF on account of headache that proceeded to left-sided weakness and right gaze deviation.  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  Neurology evaluation completed and PT recommending SNF.  Currently SNF authorization is pending.  Assessment & Plan:   Principal Problem:   Acute CVA (cerebrovascular accident) [facility]) Active Problems:   Atrial fibrillation ([place])   Anxiety state   Chronic acquired lymphedema   Essential hypertension with goal blood pressure less than 130/80   Chronic combined systolic and diastolic CHF (congestive heart failure) (HCC)   Colon carcinoma (HCC)   Enterocutaneous fistula   Physical deconditioning   Chronic anticoagulation   CVA (cerebral vascular accident) ([place])  Assessment and Plan:  Acute CVA, right lentiform nucleus/corona radiata, due to thrombosis of right middle cerebral artery Not a candidate for tPA due to use of Eliquis Appreciate neurology recommendations, continue aspirin 325 mg daily for now and then switch to Pradaxa 150 mg twice daily after holding anticoagulation for total 7 days 2D echocardiogram with no findings of thrombus or vegetation and preserved LVEF LDL 79 and will start atorvastatin 40 mg daily SLP evaluation with dysphagia 1 diet recommended Allow permissive hypertension PT recommending SNF/rehab Long-term prognosis suspected to be poor and palliative to be consulted for goals of care discussion     Atrial fibrillation with mild RVR Continue oral amiodarone  and consider infusion as needed Monitor in stepdown unit Continue metoprolol Holding Eliquis for now and continue full dose aspirin as above, plan to start on Pradaxa 150 mg twice daily per neurology recommendations after 7-day washout.   Hypothyroidism Continue levothyroxine   Hypertension Permissive hypertension Continue metoprolol to help control heart rate otherwise   Chronic systolic/diastolic CHF Hold torsemide for now until dietary intake is more stable 2D echocardiogram with preserved LVEF Currently appears euvolemic   Anxiety/depression Ativan to be ordered as needed   Chronic lymphedema Disabled as a result   Colon cancer Prior right hemicolectomy 06/2021   Morbid obesity BMI 44.81    DVT prophylaxis:Holding Eliquis Code Status: Full Family Communication: None at bedside Disposition Plan: Awaiting SNF placement Status is: Inpatient Remains inpatient appropriate because: Need for IV medications.   Consultants:  Neurology  Procedures:  None  Antimicrobials:  None   Subjective: Patient seen and evaluated today with no new acute complaints or concerns. No acute concerns or events noted overnight.  Currently sleeping.  Objective: Vitals:   12/06/22 1329 12/06/22 1400 12/06/22 1730 12/07/22 0452  BP: (!) 119/94  (!) 112/91 136/70  Pulse: 99  100 67  Resp: '17  16 20  '$ Temp: 97.8 F (36.6 C)  97.8 F (36.6 C) 98.8 F (37.1 C)  TempSrc: Oral  Axillary   SpO2: 99%  98% 96%  Weight:  111.1 kg    Height:  '5\' 2"'$  (1.575 m)      Intake/Output Summary (Last 24 hours) at 12/07/2022 1252 Last data filed at 12/07/2022 0800 Gross per 24 hour  Intake 480 ml  Output 100 ml  Net 380 ml   [employer]  12/06/22 0545 12/06/22 1400  Weight: 111.1 kg 111.1 kg    Examination:  General exam: Appears calm and comfortable, somnolent Respiratory system: Clear to auscultation. Respiratory effort normal. Cardiovascular system: S1 & S2 heard, RRR.   Gastrointestinal system: Abdomen is soft Central nervous system: Somnolent Extremities: Chronic lymphedema Skin: No significant lesions noted Psychiatry: Flat affect.    Data Reviewed: I have personally reviewed following labs and imaging studies  CBC: Recent Labs  Lab 12/06/22 0423 12/06/22 0438  WBC 7.9  --   NEUTROABS 4.0  --   HGB 14.1 15.3*  HCT 44.9 45.0  MCV 91.1  --   PLT 216  --    Basic Metabolic Panel: Recent Labs  Lab 12/06/22 0423 12/06/22 0438 12/07/22 1009  NA 134* 137 136  K 3.6 3.7 4.1  CL 99 102 105  CO2 23  --  23  GLUCOSE 115* 118* 95  BUN '16 16 11  '$ CREATININE 0.91 0.80 0.72  CALCIUM 8.9  --  8.4*  MG  --   --  2.0   GFR: Estimated Creatinine Clearance: 72.6 mL/min (by C-G formula based on SCr of 0.72 mg/dL). Liver Function Tests: Recent Labs  Lab 12/06/22 0423  AST 22  ALT 13  ALKPHOS 68  BILITOT 0.7  PROT 7.6  ALBUMIN 3.7   No results for input(s): "LIPASE", "AMYLASE" in the last 168 hours. No results for input(s): "AMMONIA" in the last 168 hours. Coagulation Profile: Recent Labs  Lab 12/06/22 0423  INR 1.1   Cardiac Enzymes: No results for input(s): "CKTOTAL", "CKMB", "CKMBINDEX", "TROPONINI" in the last 168 hours. BNP (last 3 results) No results for input(s): "PROBNP" in the last 8760 hours. HbA1C: Recent Labs    12/06/22 0748  HGBA1C 5.0   CBG: Recent Labs  Lab 12/06/22 0424  GLUCAP 118*   Lipid Profile: Recent Labs    12/07/22 0335  CHOL 154  HDL 58  LDLCALC 79  TRIG 84  CHOLHDL 2.7   Thyroid Function Tests: No results for input(s): "TSH", "T4TOTAL", "FREET4", "T3FREE", "THYROIDAB" in the last 72 hours. Anemia Panel: No results for input(s): "VITAMINB12", "FOLATE", "FERRITIN", "TIBC", "IRON", "RETICCTPCT" in the last 72 hours. Sepsis Labs: No results for input(s): "PROCALCITON", "LATICACIDVEN" in the last 168 hours.  No results found for this or any previous visit (from the past 240 hour(s)).        Radiology Studies: ECHOCARDIOGRAM COMPLETE  Result Date: 12/06/2022    ECHOCARDIOGRAM REPORT   Patient Name:   Lauren [name] Date of Exam: 12/06/2022 Medical Rec #:  [id number]       Height:       62.0 in Accession #:    [id number]      Weight:       245.0 lb Date of Birth:  1946/11/13       BSA:          2.084 m Patient Age:    58 years        BP:           146/87 mmHg Patient Gender: F               HR:           108 bpm. Exam Location:  [facility] Procedure: 2D Echo, Cardiac Doppler and Color Doppler Indications:    Stroke  History:        Patient has prior history of Echocardiogram examinations, most  recent 12/07/2012. CHF, Stroke, Arrythmias:Atrial Fibrillation,                 Signs/Symptoms:Altered Mental Status; Risk Factors:Hypertension.                 Colon CA.  Sonographer:    Wenda Low Referring Phys: [id number] [facility]  Sonographer Comments: Patient is obese. IMPRESSIONS  1. Challenging study in the setting of tachycardia. Left ventricular ejection fraction , by estimation, is 55 to 60%. The left ventricle has grossly normal function. The left ventricle has no regional wall motion abnormalities. Left ventricular diastolic function could not be evaluated.  2. Right ventricular systolic function is normal. The right ventricular size is normal. Tricuspid regurgitation signal is inadequate for assessing PA pressure.  3. Left atrial size was mild to moderately dilated.  4. Right atrial size was mildly dilated.  5. The mitral valve is abnormal. Trivial mitral valve regurgitation. No evidence of mitral stenosis. Moderate mitral annular calcification.  6. The aortic valve has an indeterminant number of cusps. Aortic valve regurgitation is not visualized. No aortic stenosis is present.  7. The inferior vena cava is normal in size with greater than 50% respiratory variability, suggesting right atrial pressure of 3 mmHg. Comparison(s): Prior images unable to be directly  viewed, comparison made by report only. Changes from prior study are noted. FINDINGS  Left Ventricle: Left ventricular ejection fraction, by estimation, is 55 to 60%. The left ventricle has normal function. The left ventricle has no regional wall motion abnormalities. The left ventricular internal cavity size was normal in size. There is  no left ventricular hypertrophy. Left ventricular diastolic function could not be evaluated due to atrial fibrillation. Left ventricular diastolic function could not be evaluated. Right Ventricle: The right ventricular size is normal. No increase in right ventricular wall thickness. Right ventricular systolic function is normal. Tricuspid regurgitation signal is inadequate for assessing PA pressure. Left Atrium: Left atrial size was mild to moderately dilated. Right Atrium: Right atrial size was mildly dilated. Pericardium: There is no evidence of pericardial effusion. Mitral Valve: The mitral valve is abnormal. Moderate mitral annular calcification. Trivial mitral valve regurgitation. No evidence of mitral valve stenosis. MV peak gradient, 12.1 mmHg. The mean mitral valve gradient is 5.0 mmHg. Tricuspid Valve: The tricuspid valve is normal in structure. Tricuspid valve regurgitation is not demonstrated. No evidence of tricuspid stenosis. Aortic Valve: The aortic valve has an indeterminant number of cusps. There is mild aortic valve annular calcification. Aortic valve regurgitation is not visualized. No aortic stenosis is present. Aortic valve mean gradient measures 4.3 mmHg. Aortic valve  peak gradient measures 8.9 mmHg. Aortic valve area, by VTI measures 2.36 cm. Pulmonic Valve: The pulmonic valve was normal in structure. Pulmonic valve regurgitation is not visualized. No evidence of pulmonic stenosis. Aorta: The aortic root is normal in size and structure. Venous: The inferior vena cava is normal in size with greater than 50% respiratory variability, suggesting right atrial  pressure of 3 mmHg. IAS/Shunts: No atrial level shunt detected by color flow Doppler.  LEFT VENTRICLE PLAX 2D LVIDd:         4.30 cm LVIDs:         3.00 cm LV PW:         1.20 cm LV IVS:        1.00 cm LVOT diam:     1.90 cm LV SV:         75 LV SV Index:  36 LVOT Area:     2.84 cm  RIGHT VENTRICLE RV Basal diam:  3.60 cm RV Mid diam:    2.90 cm RV S prime:     10.40 cm/s LEFT ATRIUM              Index        RIGHT ATRIUM           Index LA diam:        5.10 cm  2.45 cm/m   RA Area:     20.60 cm LA Vol (A2C):   109.0 ml 52.30 ml/m  RA Volume:   60.60 ml  29.08 ml/m LA Vol (A4C):   57.7 ml  27.69 ml/m LA Biplane Vol: 79.0 ml  37.91 ml/m  AORTIC VALVE                    PULMONIC VALVE AV Area (Vmax):    2.37 cm     PV Vmax:       1.07 m/s AV Area (Vmean):   2.35 cm     PV Peak grad:  4.6 mmHg AV Area (VTI):     2.36 cm AV Vmax:           149.00 cm/s AV Vmean:          92.933 cm/s AV VTI:            0.317 m AV Peak Grad:      8.9 mmHg AV Mean Grad:      4.3 mmHg LVOT Vmax:         124.50 cm/s LVOT Vmean:        76.900 cm/s LVOT VTI:          0.264 m LVOT/AV VTI ratio: 0.83  AORTA Ao Root diam: 3.10 cm MITRAL VALVE MV Area (PHT): 2.47 cm     SHUNTS MV Area VTI:   1.76 cm     Systemic VTI:  0.26 m MV Peak grad:  12.1 mmHg    Systemic Diam: 1.90 cm MV Mean grad:  5.0 mmHg MV Vmax:       1.74 m/s MV Vmean:      99.7 cm/s MV Decel Time: 307 msec MV E velocity: 144.00 cm/s Vishnu Priya Mallipeddi Electronically signed by Lorelee Cover Mallipeddi Signature Date/Time: 12/06/2022/10:20:02 PM    Final    MR BRAIN WO CONTRAST  Result Date: 12/06/2022 CLINICAL DATA:  Altered mental status.  Acute stroke suspected. EXAM: MRI HEAD WITHOUT CONTRAST TECHNIQUE: Multiplanar, multiecho pulse sequences of the brain and surrounding structures were obtained without intravenous contrast. COMPARISON:  Stroke protocol CT/CTA/CTP 12/06/2022. MRI brain 06/10/2022. FINDINGS: Brain: Acute infarct in the right lentiform nucleus  extending into the right corona radiata. No acute hemorrhage. No mass effect or midline shift. Patchy T2 hyperintensity of the cerebral white matter, consistent with chronic small vessel disease. No hydrocephalus or extra-axial fluid collection. Basilar cisterns are patent. Vascular: Normal flow voids. Skull and upper cervical spine: Normal marrow signal. Skull base is unremarkable. Sinuses/Orbits: Fluid throughout the paranasal sinuses. The mastoids and middle ear cavities are clear. Normal orbits. Other: Motion degraded study. IMPRESSION: Acute infarct in the right lentiform nucleus extending into the corona radiata. No evidence of hemorrhagic transformation or significant mass effect. Electronically Signed   By: Emmit Alexanders M.D   On: 12/06/2022 09:38   CT ANGIO HEAD NECK W WO CM W PERF (CODE STROKE)  Result Date: 12/06/2022 CLINICAL DATA:  77 year old female  code stroke presentation with left side flaccid, rightward gaze, hyperdense MCA on plain head CT. EXAM: CT ANGIOGRAPHY HEAD AND NECK CT PERFUSION BRAIN TECHNIQUE: Multidetector CT imaging of the head and neck was performed using the standard protocol during bolus administration of intravenous contrast. Multiplanar CT image reconstructions and MIPs were obtained to evaluate the vascular anatomy. Carotid stenosis measurements (when applicable) are obtained utilizing NASCET criteria, using the distal internal carotid diameter as the denominator. Multiphase CT imaging of the brain was performed following IV bolus contrast injection. Subsequent parametric perfusion maps were calculated using RAPID software. RADIATION DOSE REDUCTION: This exam was performed according to the departmental dose-optimization program which includes automated exposure control, adjustment of the mA and/or kV according to patient size and/or use of iterative reconstruction technique. CONTRAST:  181m OMNIPAQUE IOHEXOL 350 MG/ML SOLN 100 mL Omnipaque 350 COMPARISON:  Plain head CT  today 0428 hours. FINDINGS: CT Brain Perfusion Findings: ASPECTS: 10 CBF (<30%) Volume: 019mPerfusion (Tmax>6.0s) volume: 10882merritory. Hypoperfusion index 0.5 (59 mL of T-max > 10 S). Mismatch Volume: 108m10mfarction Location:Right MCA CTA NECK Skeleton: Carious dentition. Cervical spine degeneration. No acute osseous abnormality identified. Upper chest: Patchy peribronchial ground-glass opacity in the right upper lobe, multifocal. Mild reactive appearing superior mediastinal lymph nodes. Visible major airways are patent. Other visible pulmonary lobes appear spared. Other neck: Retropharyngeal course of both carotids. Mild retained secretions in the nasopharynx. Paranasal sinus fluid and mucosal thickening. Rightward gaze. Aortic arch: 3 vessel arch configuration.  No arch atherosclerosis. Right carotid system: Brachiocephalic artery, right CCA and right carotid bifurcation are negative. Mild if any soft plaque at the right ICA origin. No stenosis to the skull base. Left carotid system: Negative aside from a retropharyngeal course. Vertebral arteries: Mild proximal subclavian artery plaque without stenosis. Patent vertebral arteries to the skull base with no stenosis, the left is mildly dominant. CTA HEAD Posterior circulation: Mildly dominant left V4 segment. Patent vertebrobasilar junction with no distal vertebral stenosis. PICA origins appear normal. Patent basilar artery, SCA and PCA origins. Right posterior communicating artery is present, left diminutive or absent. Bilateral PCA branches are within normal limits. Fetal type right PCA origin suspected rather than right PCA origin stenosis on series 10, image 28. Anterior circulation: Both ICA siphons are patent. Calcified plaque more pronounced on the left. Only mild left siphon stenosis. No significant right siphon stenosis. Patent carotid termini, MCA and ACA origins. Diminutive anterior communicating artery. Bilateral ACA branches are within normal  limits. Right MCA M1 occlusion about 8 mm from its origin on series 9 image 15. This is just distal to the anterior temporal artery origin. There is some right M2 branch reconstitution. Left MCA M1 segment and bifurcation are patent without stenosis. Left MCA branches are within normal limits. Venous sinuses: Early contrast timing. Superior sagittal sinus appears patent. Anatomic variants: Mildly dominant left vertebral artery, mildly fetal type right PCA origin. Review of the MIP images confirms the above findings IMPRESSION: 1. Positive for ELVO: Right MCA M1 occluded just distal to the anterior temporal artery origin. This was discussed by telephone with Dr. CHRIJoseph Berkshire1/10/2023 at 0501 hours. 2. Subsequent CT Perfusion parameters favorable to reperfusion: No infarct core detected (CBF less than 30%), concordant with normal ASPECTS. And 108 mL of right MCA territory penumbra with hypoperfusion index of 0.5. 3. Underlying mild for age atherosclerosis in the head or neck. 4. Multifocal right upper lobe patchy peribronchial ground-glass opacity. This might be mild aspiration in this setting.  Otherwise consider developing Bronchopneumonia. Left upper lung appears clear. Electronically Signed   By: Genevie Ann M.D.   On: 12/06/2022 05:10   CT HEAD CODE STROKE WO CONTRAST  Result Date: 12/06/2022 CLINICAL DATA:  Code stroke. 77 year old female with slurred speech, left facial droop, right gaze. EXAM: CT HEAD WITHOUT CONTRAST TECHNIQUE: Contiguous axial images were obtained from the base of the skull through the vertex without intravenous contrast. RADIATION DOSE REDUCTION: This exam was performed according to the departmental dose-optimization program which includes automated exposure control, adjustment of the mA and/or kV according to patient size and/or use of iterative reconstruction technique. COMPARISON:  Head CT 06/14/2022.  Brain MRI 06/10/2022. FINDINGS: Brain: Stable cerebral volume. No midline  shift, mass effect, or evidence of intracranial mass lesion. No ventriculomegaly. Patchy asymmetric white matter hypodensity greater in the left hemisphere appears stable. No cortically based acute infarct identified. However, there is suspicious hyperdensity of the right MCA. No cytotoxic edema identified. Vascular: Calcified atherosclerosis at the skull base. Skull: Stable hyperostosis. No acute osseous abnormality identified. Sinuses/Orbits: Widespread paranasal sinus bubbly opacity, fluid levels. Retained secretions in the visible pharynx. Tympanic cavities and mastoids remain clear. Other: Rightward gaze. Visualized scalp soft tissues are within normal limits. ASPECTS [facility] Stroke Program Early CT Score) Total score (0-10 with 10 being normal): 10 IMPRESSION: 1. Suspicious dense Right MCA, with rightward gaze strongly suggesting ELVO in this setting. 2. ASPECTS 10. No acute cortically based infarct or intracranial hemorrhage identified. 3. Acute paranasal sinusitis with retained secretions in the pharynx. Electronically Signed   By: Genevie Ann M.D.   On: 12/06/2022 04:36        Scheduled Meds:  amiodarone  200 mg Oral Daily   aspirin  325 mg Oral Daily   atorvastatin  40 mg Oral Daily   levothyroxine  175 mcg Oral QAC breakfast   metoprolol tartrate  12.5 mg Oral BID   pantoprazole  40 mg Oral BID     LOS: 1 day    Time spent: 35 minutes    Perl Kerney Darleen Crocker, DO [facility]  If 7PM-7AM, please contact night-coverage [email or web address] 12/07/2022, 12:52 PM

## 2022-12-07 NOTE — TOC Progression Note (Signed)
Transition of Care [facility]) - Progression Note    Patient Details  Name: Lauren [name] MRN: [id number] Date of Birth: [birth year]  [facility] CM/SW [place], [place] Phone Number: 12/07/2022, 10:37 AM  Clinical Narrative:    At this time pts insurance Josem Kaufmann is pending. TOC to follow.   Expected Discharge Plan: [place] Barriers to Discharge: Continued Medical Work up  Expected Discharge Plan and Services In-house Referral: Clinical Social Work     Living arrangements for the past 2 months: [place]                                       Social Determinants of Health (SDOH) Interventions SDOH Screenings   Food Insecurity: No Food Insecurity (12/06/2022)  Housing: Low Risk  (12/06/2022)  Transportation Needs: No Transportation Needs (12/06/2022)  Utilities: Not At Risk (12/06/2022)  Tobacco Use: Low Risk  (12/06/2022)    Readmission Risk Interventions    07/12/2021   10:46 AM  Readmission Risk Prevention Plan  Transportation Screening Complete  Home Care Screening Complete  Medication Review (RN CM) Complete

## 2022-12-07 NOTE — Progress Notes (Signed)
This nurse was assisting patient with feeding and patient stated " can I feed myself?" Sat patient up appropriately and she is currently feeding herself with her right hand and doing well. Told her to take it slow.

## 2022-12-07 NOTE — Progress Notes (Signed)
telemetry called but they just called and said patient is running afib with HR in the 80's but she had a SVR with a pause. of 2.28. Notified DO

## 2022-12-08 DIAGNOSIS — I639 Cerebral infarction, unspecified: Secondary | ICD-10-CM | POA: Diagnosis not present

## 2022-12-08 LAB — BASIC METABOLIC PANEL
Anion gap: 9 (ref 5–15)
BUN: 13 mg/dL (ref 8–23)
CO2: 23 mmol/L (ref 22–32)
Calcium: 8.5 mg/dL — ABNORMAL LOW (ref 8.9–10.3)
Chloride: 104 mmol/L (ref 98–111)
Creatinine, Ser: 0.76 mg/dL (ref 0.44–1.00)
GFR, Estimated: >60 mL/min (ref >60)
Glucose, Bld: 91 mg/dL (ref 70–99)
Potassium: 3.9 mmol/L (ref 3.5–5.1)
Sodium: 136 mmol/L (ref 135–145)

## 2022-12-08 LAB — CBC
HCT: 45 % (ref 36.0–46.0)
Hemoglobin: 14 g/dL (ref 12.0–15.0)
MCH: 28.6 pg (ref 26.0–34.0)
MCHC: 31.1 g/dL (ref 30.0–36.0)
MCV: 91.8 fL (ref 80.0–100.0)
Platelets: 189 10*3/uL (ref 150–400)
RBC: 4.9 MIL/uL (ref 3.87–5.11)
RDW: 14 % (ref 11.5–15.5)
WBC: 8 10*3/uL (ref 4.0–10.5)
nRBC: 0 % (ref 0.0–0.2)

## 2022-12-08 LAB — MAGNESIUM: Magnesium: 2.2 mg/dL (ref 1.7–2.4)

## 2022-12-08 NOTE — Progress Notes (Signed)
Pt able to state "I hurt everywhere."  Gave tylenol crushed in applesauce, along with other meds (protonix not crushed, pt able to swallow).  Pt able to hold cup in right hand and drink 240 ml thickened liquids.  Able to hold mouth swab and lip moisturizer for oral cares.  Responding to questions appropriately.  Left side is flaccid, pt unable to move left sided extremities.  Speech slurred and slightly delayed but able to be understood.

## 2022-12-08 NOTE — Progress Notes (Signed)
PROGRESS NOTE    [name]  RUE:[id number] DOB: May 12, 1946 DOA: 12/06/2022 PCP: Nanine Means   Brief Narrative:    Lauren [name] is a 77 y.o. female with medical history significant for morbid obesity, colon cancer with prior hemicolectomy, chronic combined systolic and diastolic CHF, atrial fibrillation on Eliquis, chronic lymphedema, hypertension, and hypothyroidism who presented to the ED via EMS from her SNF on account of headache that proceeded to left-sided weakness and right gaze deviation.  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  Neurology evaluation completed and PT recommending SNF.  Currently SNF authorization is pending.  Assessment & Plan:   Principal Problem:   Acute CVA (cerebrovascular accident) [facility]) Active Problems:   Atrial fibrillation ([place])   Anxiety state   Chronic acquired lymphedema   Essential hypertension with goal blood pressure less than 130/80   Chronic combined systolic and diastolic CHF (congestive heart failure) (HCC)   Colon carcinoma (H[facility])   Enterocutaneous fistula   Physical deconditioning   Chronic anticoagulation   CVA (cerebral vascular accident) ([place])  Assessment and Plan:  Acute CVA, right lentiform nucleus/corona radiata, due to thrombosis of right middle cerebral artery Not a candidate for tPA due to use of Eliquis Appreciate neurology recommendations, continue aspirin 325 mg daily for now and then switch to Pradaxa 150 mg twice daily after holding anticoagulation for total 7 days 2D echocardiogram with no findings of thrombus or vegetation and preserved LVEF LDL 79 and will started atorvastatin 40 mg daily SLP evaluation with dysphagia 1 diet recommended Allow permissive hypertension PT recommending SNF/rehab Long-term prognosis suspected to be poor and palliative to be consulted for goals of care discussion     Atrial fibrillation with mild RVR Continue oral amiodarone  and consider infusion as needed Monitor in stepdown unit Continue metoprolol Holding Eliquis for now and continue full dose aspirin as above, plan to start on Pradaxa 150 mg twice daily per neurology recommendations after 7-day washout.   Hypothyroidism Continue levothyroxine   Hypertension Permissive hypertension Continue metoprolol to help control heart rate otherwise   Chronic systolic/diastolic CHF Hold torsemide for now until dietary intake is more stable 2D echocardiogram with preserved LVEF Currently appears euvolemic   Anxiety/depression Ativan to be ordered as needed   Chronic lymphedema Disabled as a result   Colon cancer Prior right hemicolectomy 06/2021   Morbid obesity BMI 44.81    DVT prophylaxis:Holding Eliquis Code Status: Full Family Communication: None at bedside Disposition Plan: Awaiting SNF placement Status is: Inpatient Remains inpatient appropriate because: Need for IV medications.   Consultants:  Neurology  Procedures:  None  Antimicrobials:  None   Subjective: Patient seen and evaluated today with no new acute complaints or concerns. No acute concerns or events noted overnight.  Currently sleeping.  Objective: Vitals:   12/07/22 0452 12/07/22 1445 12/07/22 2055 12/08/22 0435  BP: 136/70 116/72 (!) 150/87 (!) 156/88  Pulse: 67 100 84 96  Resp: '20 17 18 18  '$ Temp: 98.8 F (37.1 C) 98.6 F (37 C) (!) 97 F (36.1 C) (!) 97.3 F (36.3 C)  TempSrc:  Oral    SpO2: 96% 97% 96% 96%  Weight:      Height:        Intake/Output Summary (Last 24 hours) at 12/08/2022 1249 Last data filed at 12/08/2022 0600 Gross per 24 hour  Intake 690 ml  Output 650 ml  Net 40 ml   [employer]  12/06/22 0545 12/06/22 1400  Weight: 111.1 kg 111.1 kg    Examination:  General exam: Appears calm and comfortable, somnolent Respiratory system: Clear to auscultation. Respiratory effort normal. Cardiovascular system: S1 & S2 heard, RRR.   Gastrointestinal system: Abdomen is soft Central nervous system: Somnolent Extremities: Chronic lymphedema Skin: No significant lesions noted Psychiatry: Flat affect.    Data Reviewed: I have personally reviewed following labs and imaging studies  CBC: Recent Labs  Lab 12/06/22 0423 12/06/22 0438 12/08/22 0445  WBC 7.9  --  8.0  NEUTROABS 4.0  --   --   HGB 14.1 15.3* 14.0  HCT 44.9 45.0 45.0  MCV 91.1  --  91.8  PLT 216  --  573   Basic Metabolic Panel: Recent Labs  Lab 12/06/22 0423 12/06/22 0438 12/07/22 1009 12/08/22 0445  NA 134* 137 136 136  K 3.6 3.7 4.1 3.9  CL 99 102 105 104  CO2 23  --  23 23  GLUCOSE 115* 118* 95 91  BUN '16 16 11 13  '$ CREATININE 0.91 0.80 0.72 0.76  CALCIUM 8.9  --  8.4* 8.5*  MG  --   --  2.0 2.2   GFR: Estimated Creatinine Clearance: 72.6 mL/min (by C-G formula based on SCr of 0.76 mg/dL). Liver Function Tests: Recent Labs  Lab 12/06/22 0423  AST 22  ALT 13  ALKPHOS 68  BILITOT 0.7  PROT 7.6  ALBUMIN 3.7   No results for input(s): "LIPASE", "AMYLASE" in the last 168 hours. No results for input(s): "AMMONIA" in the last 168 hours. Coagulation Profile: Recent Labs  Lab 12/06/22 0423  INR 1.1   Cardiac Enzymes: No results for input(s): "CKTOTAL", "CKMB", "CKMBINDEX", "TROPONINI" in the last 168 hours. BNP (last 3 results) No results for input(s): "PROBNP" in the last 8760 hours. HbA1C: Recent Labs    12/06/22 0748  HGBA1C 5.0   CBG: Recent Labs  Lab 12/06/22 0424  GLUCAP 118*   Lipid Profile: Recent Labs    12/07/22 0335  CHOL 154  HDL 58  LDLCALC 79  TRIG 84  CHOLHDL 2.7   Thyroid Function Tests: No results for input(s): "TSH", "T4TOTAL", "FREET4", "T3FREE", "THYROIDAB" in the last 72 hours. Anemia Panel: No results for input(s): "VITAMINB12", "FOLATE", "FERRITIN", "TIBC", "IRON", "RETICCTPCT" in the last 72 hours. Sepsis Labs: No results for input(s): "PROCALCITON", "LATICACIDVEN" in the last 168  hours.  No results found for this or any previous visit (from the past 240 hour(s)).       Radiology Studies: No results found.      Scheduled Meds:  amiodarone  200 mg Oral Daily   aspirin  325 mg Oral Daily   atorvastatin  40 mg Oral Daily   levothyroxine  175 mcg Oral QAC breakfast   metoprolol tartrate  12.5 mg Oral BID   pantoprazole  40 mg Oral BID     LOS: 2 days    Time spent: 35 minutes    Clara Smolen Darleen Crocker, DO [facility]  If 7PM-7AM, please contact night-coverage [email or web address] 12/08/2022, 12:49 PM

## 2022-12-08 NOTE — TOC Progression Note (Addendum)
Transition of Care [facility]) - Progression Note    Patient Details  Name: Lauren [name] MRN: [id number] Date of Birth: [birth year]  [facility] CM/SW Contact  Salome Arnt, [place] Phone Number: 12/08/2022, 2:22 PM  Clinical Narrative: SNF authorization still pending. Updated auth on pt's baseline.       Expected Discharge Plan: [place] Barriers to Discharge: Continued Medical Work up, Ship broker  Expected Discharge Plan and Services In-house Referral: Clinical Social Work     Living arrangements for the past 2 months: [place]                                       Social Determinants of Health (SDOH) Interventions SDOH Screenings   Food Insecurity: No Food Insecurity (12/06/2022)  Housing: Low Risk  (12/06/2022)  Transportation Needs: No Transportation Needs (12/06/2022)  Utilities: Not At Risk (12/06/2022)  Tobacco Use: Low Risk  (12/06/2022)    Readmission Risk Interventions    07/12/2021   10:46 AM  Readmission Risk Prevention Plan  Transportation Screening Complete  Home Care Screening Complete  Medication Review (RN CM) Complete

## 2022-12-09 ENCOUNTER — Encounter (HOSPITAL_COMMUNITY): Payer: Self-pay | Admitting: Internal Medicine

## 2022-12-09 DIAGNOSIS — Z515 Encounter for palliative care: Secondary | ICD-10-CM | POA: Diagnosis not present

## 2022-12-09 DIAGNOSIS — I639 Cerebral infarction, unspecified: Secondary | ICD-10-CM | POA: Diagnosis not present

## 2022-12-09 DIAGNOSIS — Z7189 Other specified counseling: Secondary | ICD-10-CM

## 2022-12-09 DIAGNOSIS — R5381 Other malaise: Secondary | ICD-10-CM

## 2022-12-09 MED ORDER — DABIGATRAN ETEXILATE MESYLATE 150 MG PO CAPS: 150.0000 mg | ORAL_CAPSULE | Freq: Two times a day (BID) | ORAL | 1 refills | Status: DC

## 2022-12-09 MED ORDER — ASPIRIN 325 MG PO TABS: 325.0000 mg | ORAL_TABLET | Freq: Every day | ORAL | 0 refills | Status: AC

## 2022-12-09 MED ORDER — ATORVASTATIN CALCIUM 40 MG PO TABS: 40.0000 mg | ORAL_TABLET | Freq: Every day | ORAL | 0 refills | Status: AC

## 2022-12-09 NOTE — Discharge Summary (Signed)
Physician Discharge Summary  Lauren [name] FGH:[id number] DOB: 05-16-1946 DOA: 12/06/2022  PCP: Nanine Means  Admit date: 12/06/2022  Discharge date: 12/09/2022  Admitted From:SNF  Disposition:  SNF  Recommendations for Outpatient Follow-up:  Follow up with PCP in 1-2 weeks Follow-up with neurology as requested in 3 months Continue atorvastatin 40 mg daily Continue aspirin 325 mg daily until 1/19 at which point Pradaxa 150 mg twice daily can be started as ordered for atrial fibrillation Continue other home medications as prior Follow-up with outpatient palliative care  Home Health: None  Equipment/Devices: None  Discharge Condition:Stable  CODE STATUS: Full  Diet recommendation: Dysphagia 1 diet  Brief/Interim Summary: Lauren [name] is a 77 y.o. female with medical history significant for morbid obesity, colon cancer with prior hemicolectomy, chronic combined systolic and diastolic CHF, atrial fibrillation on Eliquis, chronic lymphedema, hypertension, and hypothyroidism who presented to the ED via EMS from her SNF on account of headache that proceeded to left-sided weakness and right gaze deviation.  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  Neurology evaluation completed with recommendations as outlined above and PT recommending SNF.  2D echocardiogram with no findings of thrombus or vegetation noted and speech evaluation recommending dysphagia 1 diet.  Patient required several days of hospitalization for SNF authorization which she has now received.  Unfortunately, palliative was not available for further discussions regarding goals of care, but her prognosis is poor in the long-term and outpatient palliative care has been recommended.  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  Discharge Instructions  Discharge Instructions     Ambulatory referral to Neurology   Complete by: As directed    An appointment is requested in approximately: 12 weeks   Diet - low sodium heart healthy   Complete by: As directed    Increase activity slowly   Complete by: As directed       Allergies as of 12/09/2022       Reactions   Codeine Itching, Other (See Comments)   "Allergic," per [facility]   Compazine [prochlorperazine Edisylate] Other (See Comments)   "Allergic," per [facility]   Prednisone Nausea And Vomiting, Other (See Comments)   "Allergic," per [facility]   Tetanus Toxoids Other (See Comments)   Knot like "hen's egg" came up and "Allergic," per [facility]   Tetracyclines & Related Other (See Comments)   Listed on MAR from facility   Vicodin [hydrocodone-acetaminophen] Itching, Other (See Comments)   "Allergic," per [facility]        Medication List     STOP taking these medications    apixaban 5 MG Tabs tablet Commonly known as: Eliquis       TAKE these medications    acetaminophen 325 MG tablet Commonly known as: TYLENOL Take 2 tablets (650 mg total) by mouth every 6 (six) hours as needed for mild pain. What changed:  when to take this reasons  to take this   amiodarone 200 MG tablet Commonly known as: PACERONE TAKE 1 TABLET DAILY   aspirin 325 MG tablet Take 1 tablet (325 mg total) by mouth daily for 3 days. Start taking on: December 10, 2022   atorvastatin 40 MG tablet Commonly known as: LIPITOR Take  1 tablet (40 mg total) by mouth daily. Start taking on: December 10, 2022 What changed:  medication strength how much to take when to take this   Cholecalciferol 50 MCG (2000 UT) Tabs Take 2,000 Units by mouth every evening.   dabigatran 150 MG Caps capsule Commonly known as: Pradaxa Take 1 capsule (150 mg total) by mouth 2 (two) times daily. Start taking on: December 13, 2022   feeding supplement Liqd Take 237 mLs by mouth 3 (three) times daily between meals.   ferrous sulfate 325 (65 FE) MG EC tablet Take 1 tablet (325 mg total) by mouth 2 (two) times daily.   ipratropium-albuterol 0.5-2.5 (3) MG/3ML Soln Commonly known as: DUONEB Take 3 mLs by nebulization every 2 (two) hours as needed.   levothyroxine 150 MCG tablet Commonly known as: SYNTHROID Take 150 mcg by mouth in the morning.   liver oil-zinc oxide 40 % ointment Commonly known as: DESITIN Apply topically as needed for irritation.   melatonin 3 MG Tabs tablet Take 3 mg by mouth daily as needed (insomnia).   metoprolol tartrate 25 MG tablet Commonly known as: LOPRESSOR TAKE 1/2 TABLET 2 TIMES A DAY What changed: See the new instructions.   mirtazapine 7.5 MG tablet Commonly known as: REMERON Take 1 tablet (7.5 mg total) by mouth at bedtime.   multivitamin with minerals Tabs tablet Take 1 tablet by mouth daily.   OXYGEN Inhale 2 L/min into the lungs as needed (for shortness of breath).   pantoprazole 40 MG tablet Commonly known as: PROTONIX TAKE 1 TABLET 2 TIMES A DAY   potassium chloride SA 20 MEQ tablet Commonly known as: KLOR-CON M TAKE 1 TABLET DAILY   thiamine 100 MG tablet Commonly known as: VITAMIN B1 Take 100 mg by mouth every evening.   torsemide 20 MG tablet Commonly known as: [place] 1 TABLET ONCE [place]. Schedule an appointment as soon as possible for a visit in 1 week(s).   Specialty: [place]  information: [place] [address] [phone]         [facility]. Schedule an appointment as soon as possible for a visit in 3 month(s).   Specialty: Neurology Contact information: [address] [place] [address] [phone]               Allergies  Allergen Reactions   Codeine Itching and Other (See Comments)    "Allergic," per [facility]   Compazine [Prochlorperazine Edisylate] Other (See Comments)    "Allergic," per [facility]   Prednisone Nausea And Vomiting and Other (See Comments)    "Allergic," per [facility]   Tetanus Toxoids Other (See Comments)    Knot like "hen's egg" came up and "Allergic," per [facility]   Tetracyclines & Related Other (See Comments)    Listed on MAR from facility   Vicodin [Hydrocodone-Acetaminophen] Itching and Other (See Comments)    "Allergic," per [facility]    Consultations: Neurology   Procedures/Studies: ECHOCARDIOGRAM COMPLETE  Result Date: 12/06/2022    ECHOCARDIOGRAM REPORT   Patient Name:   Lauren  Domenick [name] Date of Exam: 12/06/2022 Medical Rec #:  [id number]       Height:       62.0 in Accession #:    [id number]      Weight:       245.0 lb Date of Birth:  Dec 05, 1945       BSA:          2.084 m Patient Age:    77 years        BP:           146/87 mmHg Patient Gender: F               HR:           108 bpm. Exam Location:  [facility] Procedure: 2D Echo, Cardiac Doppler and Color Doppler Indications:    Stroke  History:        Patient has prior history of Echocardiogram examinations, most                 recent 12/07/2012. CHF, Stroke, Arrythmias:Atrial Fibrillation,                 Signs/Symptoms:Altered Mental Status; Risk Factors:Hypertension.                 Colon CA.  Sonographer:    Wenda Low Referring Phys: [id number] [place] [facility]  Sonographer Comments: Patient is obese. IMPRESSIONS  1. Challenging study in the setting of tachycardia. Left ventricular ejection fraction , by estimation, is 55 to  60%. The left ventricle has grossly normal function. The left ventricle has no regional wall motion abnormalities. Left ventricular diastolic function could not be evaluated.  2. Right ventricular systolic function is normal. The right ventricular size is normal. Tricuspid regurgitation signal is inadequate for assessing PA pressure.  3. Left atrial size was mild to moderately dilated.  4. Right atrial size was mildly dilated.  5. The mitral valve is abnormal. Trivial mitral valve regurgitation. No evidence of mitral stenosis. Moderate mitral annular calcification.  6. The aortic valve has an indeterminant number of cusps. Aortic valve regurgitation is not visualized. No aortic stenosis is present.  7. The inferior vena cava is normal in size with greater than 50% respiratory variability, suggesting right atrial pressure of 3 mmHg. Comparison(s): Prior images unable to be directly viewed, comparison made by report only. Changes from prior study are noted. FINDINGS  Left Ventricle: Left ventricular ejection fraction, by estimation, is 55 to 60%. The left ventricle has normal function. The left ventricle has no regional wall motion abnormalities. The left ventricular internal cavity size was normal in size. There is  no left ventricular hypertrophy. Left ventricular diastolic function could not be evaluated due to atrial fibrillation. Left ventricular diastolic function could not be evaluated. Right Ventricle: The right ventricular size is normal. No increase in right ventricular wall thickness. Right ventricular systolic function is normal. Tricuspid regurgitation signal is inadequate for assessing PA pressure. Left Atrium: Left atrial size was mild to moderately dilated. Right Atrium: Right atrial size was mildly dilated. Pericardium: There is no evidence of pericardial effusion. Mitral Valve: The mitral valve is abnormal. Moderate mitral annular calcification. Trivial mitral valve regurgitation. No evidence of  mitral valve stenosis. MV peak gradient, 12.1 mmHg. The mean mitral valve gradient is 5.0 mmHg. Tricuspid Valve: The tricuspid valve is normal in structure. Tricuspid valve regurgitation is not demonstrated. No evidence of tricuspid stenosis. Aortic Valve: The aortic valve has an indeterminant number of cusps.  There is mild aortic valve annular calcification. Aortic valve regurgitation is not visualized. No aortic stenosis is present. Aortic valve mean gradient measures 4.3 mmHg. Aortic valve  peak gradient measures 8.9 mmHg. Aortic valve area, by VTI measures 2.36 cm. Pulmonic Valve: The pulmonic valve was normal in structure. Pulmonic valve regurgitation is not visualized. No evidence of pulmonic stenosis. Aorta: The aortic root is normal in size and structure. Venous: The inferior vena cava is normal in size with greater than 50% respiratory variability, suggesting right atrial pressure of 3 mmHg. IAS/Shunts: No atrial level shunt detected by color flow Doppler.  LEFT VENTRICLE PLAX 2D LVIDd:         4.30 cm LVIDs:         3.00 cm LV PW:         1.20 cm LV IVS:        1.00 cm LVOT diam:     1.90 cm LV SV:         75 LV SV Index:   36 LVOT Area:     2.84 cm  RIGHT VENTRICLE RV Basal diam:  3.60 cm RV Mid diam:    2.90 cm RV S prime:     10.40 cm/s LEFT ATRIUM              Index        RIGHT ATRIUM           Index LA diam:        5.10 cm  2.45 cm/m   RA Area:     20.60 cm LA Vol (A2C):   109.0 ml 52.30 ml/m  RA Volume:   60.60 ml  29.08 ml/m LA Vol (A4C):   57.7 ml  27.69 ml/m LA Biplane Vol: 79.0 ml  37.91 ml/m  AORTIC VALVE                    PULMONIC VALVE AV Area (Vmax):    2.37 cm     PV Vmax:       1.07 m/s AV Area (Vmean):   2.35 cm     PV Peak grad:  4.6 mmHg AV Area (VTI):     2.36 cm AV Vmax:           149.00 cm/s AV Vmean:          92.933 cm/s AV VTI:            0.317 m AV Peak Grad:      8.9 mmHg AV Mean Grad:      4.3 mmHg LVOT Vmax:         124.50 cm/s LVOT Vmean:        76.900 cm/s LVOT  VTI:          0.264 m LVOT/AV VTI ratio: 0.83  AORTA Ao Root diam: 3.10 cm MITRAL VALVE MV Area (PHT): 2.47 cm     SHUNTS MV Area VTI:   1.76 cm     Systemic VTI:  0.26 m MV Peak grad:  12.1 mmHg    Systemic Diam: 1.90 cm MV Mean grad:  5.0 mmHg MV Vmax:       1.74 m/s MV Vmean:      99.7 cm/s MV Decel Time: 307 msec MV E velocity: 144.00 cm/s Vishnu Priya Mallipeddi Electronically signed by Lorelee Cover Mallipeddi Signature Date/Time: 12/06/2022/10:20:02 PM    Final    MR BRAIN WO CONTRAST  Result Date: 12/06/2022 CLINICAL DATA:  Altered mental  status.  Acute stroke suspected. EXAM: MRI HEAD WITHOUT CONTRAST TECHNIQUE: Multiplanar, multiecho pulse sequences of the brain and surrounding structures were obtained without intravenous contrast. COMPARISON:  Stroke protocol CT/CTA/CTP 12/06/2022. MRI brain 06/10/2022. FINDINGS: Brain: Acute infarct in the right lentiform nucleus extending into the right corona radiata. No acute hemorrhage. No mass effect or midline shift. Patchy T2 hyperintensity of the cerebral white matter, consistent with chronic small vessel disease. No hydrocephalus or extra-axial fluid collection. Basilar cisterns are patent. Vascular: Normal flow voids. Skull and upper cervical spine: Normal marrow signal. Skull base is unremarkable. Sinuses/Orbits: Fluid throughout the paranasal sinuses. The mastoids and middle ear cavities are clear. Normal orbits. Other: Motion degraded study. IMPRESSION: Acute infarct in the right lentiform nucleus extending into the corona radiata. No evidence of hemorrhagic transformation or significant mass effect. Electronically Signed   By: Emmit Alexanders M.D   On: 12/06/2022 09:38   CT ANGIO HEAD NECK W WO CM W PERF (CODE STROKE)  Result Date: 12/06/2022 CLINICAL DATA:  77 year old female code stroke presentation with left side flaccid, rightward gaze, hyperdense MCA on plain head CT. EXAM: CT ANGIOGRAPHY HEAD AND NECK CT PERFUSION BRAIN TECHNIQUE:  Multidetector CT imaging of the head and neck was performed using the standard protocol during bolus administration of intravenous contrast. Multiplanar CT image reconstructions and MIPs were obtained to evaluate the vascular anatomy. Carotid stenosis measurements (when applicable) are obtained utilizing NASCET criteria, using the distal internal carotid diameter as the denominator. Multiphase CT imaging of the brain was performed following IV bolus contrast injection. Subsequent parametric perfusion maps were calculated using RAPID software. RADIATION DOSE REDUCTION: This exam was performed according to the departmental dose-optimization program which includes automated exposure control, adjustment of the mA and/or kV according to patient size and/or use of iterative reconstruction technique. CONTRAST:  166m OMNIPAQUE IOHEXOL 350 MG/ML SOLN 100 mL Omnipaque 350 COMPARISON:  Plain head CT today 0428 hours. FINDINGS: CT Brain Perfusion Findings: ASPECTS: 10 CBF (<30%) Volume: 026mPerfusion (Tmax>6.0s) volume: 10817merritory. Hypoperfusion index 0.5 (59 mL of T-max > 10 S). Mismatch Volume: 108m94mfarction Location:Right MCA CTA NECK Skeleton: Carious dentition. Cervical spine degeneration. No acute osseous abnormality identified. Upper chest: Patchy peribronchial ground-glass opacity in the right upper lobe, multifocal. Mild reactive appearing superior mediastinal lymph nodes. Visible major airways are patent. Other visible pulmonary lobes appear spared. Other neck: Retropharyngeal course of both carotids. Mild retained secretions in the nasopharynx. Paranasal sinus fluid and mucosal thickening. Rightward gaze. Aortic arch: 3 vessel arch configuration.  No arch atherosclerosis. Right carotid system: Brachiocephalic artery, right CCA and right carotid bifurcation are negative. Mild if any soft plaque at the right ICA origin. No stenosis to the skull base. Left carotid system: Negative aside from a retropharyngeal  course. Vertebral arteries: Mild proximal subclavian artery plaque without stenosis. Patent vertebral arteries to the skull base with no stenosis, the left is mildly dominant. CTA HEAD Posterior circulation: Mildly dominant left V4 segment. Patent vertebrobasilar junction with no distal vertebral stenosis. PICA origins appear normal. Patent basilar artery, SCA and PCA origins. Right posterior communicating artery is present, left diminutive or absent. Bilateral PCA branches are within normal limits. Fetal type right PCA origin suspected rather than right PCA origin stenosis on series 10, image 28. Anterior circulation: Both ICA siphons are patent. Calcified plaque more pronounced on the left. Only mild left siphon stenosis. No significant right siphon stenosis. Patent carotid termini, MCA and ACA origins. Diminutive anterior communicating artery. Bilateral ACA branches  are within normal limits. Right MCA M1 occlusion about 8 mm from its origin on series 9 image 15. This is just distal to the anterior temporal artery origin. There is some right M2 branch reconstitution. Left MCA M1 segment and bifurcation are patent without stenosis. Left MCA branches are within normal limits. Venous sinuses: Early contrast timing. Superior sagittal sinus appears patent. Anatomic variants: Mildly dominant left vertebral artery, mildly fetal type right PCA origin. Review of the MIP images confirms the above findings IMPRESSION: 1. Positive for ELVO: Right MCA M1 occluded just distal to the anterior temporal artery origin. This was discussed by telephone with Dr. Joseph Berkshire on 12/06/2022 at 0501 hours. 2. Subsequent CT Perfusion parameters favorable to reperfusion: No infarct core detected (CBF less than 30%), concordant with normal ASPECTS. And 108 mL of right MCA territory penumbra with hypoperfusion index of 0.5. 3. Underlying mild for age atherosclerosis in the head or neck. 4. Multifocal right upper lobe patchy  peribronchial ground-glass opacity. This might be mild aspiration in this setting. Otherwise consider developing Bronchopneumonia. Left upper lung appears clear. Electronically Signed   By: Genevie Ann M.D.   On: 12/06/2022 05:10   CT HEAD CODE STROKE WO CONTRAST  Result Date: 12/06/2022 CLINICAL DATA:  Code stroke. 77 year old female with slurred speech, left facial droop, right gaze. EXAM: CT HEAD WITHOUT CONTRAST TECHNIQUE: Contiguous axial images were obtained from the base of the skull through the vertex without intravenous contrast. RADIATION DOSE REDUCTION: This exam was performed according to the departmental dose-optimization program which includes automated exposure control, adjustment of the mA and/or kV according to patient size and/or use of iterative reconstruction technique. COMPARISON:  Head CT 06/14/2022.  Brain MRI 06/10/2022. FINDINGS: Brain: Stable cerebral volume. No midline shift, mass effect, or evidence of intracranial mass lesion. No ventriculomegaly. Patchy asymmetric white matter hypodensity greater in the left hemisphere appears stable. No cortically based acute infarct identified. However, there is suspicious hyperdensity of the right MCA. No cytotoxic edema identified. Vascular: Calcified atherosclerosis at the skull base. Skull: Stable hyperostosis. No acute osseous abnormality identified. Sinuses/Orbits: Widespread paranasal sinus bubbly opacity, fluid levels. Retained secretions in the visible pharynx. Tympanic cavities and mastoids remain clear. Other: Rightward gaze. Visualized scalp soft tissues are within normal limits. ASPECTS [facility] Stroke Program Early CT Score) Total score (0-10 with 10 being normal): 10 IMPRESSION: 1. Suspicious dense Right MCA, with rightward gaze strongly suggesting ELVO in this setting. 2. ASPECTS 10. No acute cortically based infarct or intracranial hemorrhage identified. 3. Acute paranasal sinusitis with retained secretions in the pharynx.  Electronically Signed   By: Genevie Ann M.D.   On: 12/06/2022 04:36     Discharge Exam: Vitals:   12/09/22 0416 12/09/22 1028  BP: 134/74 (!) 142/77  Pulse: (!) 55 64  Resp: 18 18  Temp: 98.4 F (36.9 C) 97.6 F (36.4 C)  SpO2: 99% 96%   Vitals:   12/08/22 1538 12/08/22 2036 12/09/22 0416 12/09/22 1028  BP: (!) 147/90 132/84 134/74 (!) 142/77  Pulse: 99 75 (!) 55 64  Resp: '20 18 18 18  '$ Temp: 97.6 F (36.4 C) 97.7 F (36.5 C) 98.4 F (36.9 C) 97.6 F (36.4 C)  TempSrc: Oral Oral Oral Oral  SpO2: 97% 100% 99% 96%  Weight:      Height:        General: Pt is alert, awake, not in acute distress Cardiovascular: RRR, S1/S2 +, no rubs, no gallops Respiratory: CTA bilaterally, no wheezing, no rhonchi  Abdominal: Soft, NT, ND, bowel sounds + Extremities: Chronic lymphedema    The results of significant diagnostics from this hospitalization (including imaging, microbiology, ancillary and laboratory) are listed below for reference.     Microbiology: No results found for this or any previous visit (from the past 240 hour(s)).   Labs: BNP (last 3 results) No results for input(s): "BNP" in the last 8760 hours. Basic Metabolic Panel: Recent Labs  Lab 12/06/22 0423 12/06/22 0438 12/07/22 1009 12/08/22 0445  NA 134* 137 136 136  K 3.6 3.7 4.1 3.9  CL 99 102 105 104  CO2 23  --  23 23  GLUCOSE 115* 118* 95 91  BUN '16 16 11 13  '$ CREATININE 0.91 0.80 0.72 0.76  CALCIUM 8.9  --  8.4* 8.5*  MG  --   --  2.0 2.2   Liver Function Tests: Recent Labs  Lab 12/06/22 0423  AST 22  ALT 13  ALKPHOS 68  BILITOT 0.7  PROT 7.6  ALBUMIN 3.7   No results for input(s): "LIPASE", "AMYLASE" in the last 168 hours. No results for input(s): "AMMONIA" in the last 168 hours. CBC: Recent Labs  Lab 12/06/22 0423 12/06/22 0438 12/08/22 0445  WBC 7.9  --  8.0  NEUTROABS 4.0  --   --   HGB 14.1 15.3* 14.0  HCT 44.9 45.0 45.0  MCV 91.1  --  91.8  PLT 216  --  189   Cardiac  Enzymes: No results for input(s): "CKTOTAL", "CKMB", "CKMBINDEX", "TROPONINI" in the last 168 hours. BNP: Invalid input(s): "POCBNP" CBG: Recent Labs  Lab 12/06/22 0424  GLUCAP 118*   D-Dimer No results for input(s): "DDIMER" in the last 72 hours. Hgb A1c No results for input(s): "HGBA1C" in the last 72 hours. Lipid Profile Recent Labs    12/07/22 0335  CHOL 154  HDL 58  LDLCALC 79  TRIG 84  CHOLHDL 2.7   Thyroid function studies No results for input(s): "TSH", "T4TOTAL", "T3FREE", "THYROIDAB" in the last 72 hours.  Invalid input(s): "FREET3" Anemia work up No results for input(s): "VITAMINB12", "FOLATE", "FERRITIN", "TIBC", "IRON", "RETICCTPCT" in the last 72 hours. Urinalysis    Component Value Date/Time   COLORURINE YELLOW 12/08/2021 1310   APPEARANCEUR CLEAR 12/08/2021 1310   LABSPEC 1.020 12/08/2021 1310   PHURINE 6.0 12/08/2021 1310   GLUCOSEU NEGATIVE 12/08/2021 1310   HGBUR NEGATIVE 12/08/2021 1310   BILIRUBINUR SMALL (A) 12/08/2021 1310   KETONESUR NEGATIVE 12/08/2021 1310   PROTEINUR NEGATIVE 12/08/2021 1310   UROBILINOGEN 0.2 03/26/2014 1346   NITRITE NEGATIVE 12/08/2021 1310   LEUKOCYTESUR NEGATIVE 12/08/2021 1310   Sepsis Labs Recent Labs  Lab 12/06/22 0423 12/08/22 0445  WBC 7.9 8.0   Microbiology No results found for this or any previous visit (from the past 240 hour(s)).   Time coordinating discharge: 35 minutes  SIGNED:   Rodena Goldmann, DO [facility] 12/09/2022, 10:40 AM  If 7PM-7AM, please contact night-coverage [email or web address]

## 2022-12-09 NOTE — Consult Note (Signed)
Consultation Note Date: 12/09/2022   Patient Name: Lauren [name]  DOB: 01-22-1946  MRN: [id number]  Age / Sex: 77 y.o., female  PCP: Lauren [name] Referring Physician: Rodena Goldmann, DO  Reason for Consultation: Establishing goals of care  HPI/Patient Profile: 77 y.o. female  with past medical history of morbid obesity, colon cancer with prior hemicolectomy, chronic combined systolic/diastolic heart failure, A-fib on Eliquis, chronic lymphedema, HTN, hypothyroid, history of acute cholecystitis, diverticulosis, acute pancreatitis,, resident of objective discrete under long-term care/legal guardianship with [place] admitted on 12/06/2022 with acute CVA.   Clinical Assessment and Goals of Care: I have reviewed medical records including EPIC notes, labs and imaging, received report from RN, assessed the patient.  Lauren [name] is resting quietly in bed.  She appears acutely/chronically ill and frail, obese.  She is resting comfortably, but wakes easily when I call her name.  She is alert and oriented x 3, able to make her needs known.  There is no family at bedside at this time.  Lauren [name] seems to have DSS guardianship through [facility].    We meet at the bedside to discuss diagnosis prognosis, GOC, EOL wishes, disposition and options.  I introduced Palliative Medicine as specialized medical care for people living with serious illness. It focuses on providing relief from the symptoms and stress of a serious illness. The goal is to improve quality of life for both the patient and the family.  We focused on their current illness.  I asked Lauren [name] what happened.  She states that she got the flu.  I share that she had a stroke.  She does not seem surprised by this.  We talk about returning to [facility] with rehab where she has lived under long-term care.  The natural disease trajectory and  expectations at EOL were discussed.   Advanced directives, concepts specific to code status, artifical feeding and hydration, and rehospitalization were considered and discussed.  At this point Ms. [name] states that she would want full scope/full code.  Unable to set limits on life support.  Meeting with palliative team September 2022 shows that Lauren [name] was open to further discussion if her health declines.  Discussed the importance of continued conversation with family and the medical providers regarding overall plan of care and treatment options, ensuring decisions are within the context of the patient's values and GOCs. Questions and concerns were addressed. The patient was encouraged to call with questions or concerns.  PMT will continue to support holistically.   HCPOA  LEGAL GUARDIAN -Lauren [name] has a legal guardian with [place].  She tells me that she does have a spouse and a daughter.      SUMMARY OF RECOMMENDATIONS   At this point continue full scope/full code Return to long-term care facility, [facility], with rehab Would benefit from outpatient palliative services   Code Status/Advance Care Planning: Full code  Symptom Management:  Per hospitalist, no additional needs at this time.  Palliative Prophylaxis:  Oral  Care and Turn Reposition  Additional Recommendations (Limitations, Scope, Preferences): Full Scope Treatment  Psycho-social/Spiritual:  Desire for further Chaplaincy support:no Additional Recommendations: Caregiving  Support/Resources and Education on Hospice  Prognosis:  Unable to determine, based on outcomes.  Guarded at this point due to chronic illness burden.  Discharge Planning: Rehab at her long-term care facility, [facility]      Primary Diagnoses: Present on Admission:  Acute CVA (cerebrovascular accident) [facility])  Anxiety state  Atrial fibrillation ([place])  Chronic combined systolic and diastolic CHF (congestive heart failure)  ([place])  Colon carcinoma ([place])  Enterocutaneous fistula  Essential hypertension with goal blood pressure less than 130/80  Physical deconditioning  CVA (cerebral vascular accident) ([place])   I have reviewed the medical record, interviewed the patient and family, and examined the patient. The following aspects are pertinent.  Past Medical History:  Diagnosis Date   Acute cholecystitis 12/06/2012   Chronic combined systolic and diastolic CHF (congestive heart failure) (HCC)    Colon cancer (HCC)    Diverticulosis    Hypotension    BP runs soft   Hypothyroidism    Lymphedema    Morbid obesity ([place])    Obesity    Pancreatitis, acute 88/82/8003   Periumbilical hernia    Permanent atrial fibrillation (HCC)    Social History   Socioeconomic History   Marital status: Married    Spouse name: Not on file   Number of children: Not on file   Years of education: Not on file   Highest education level: Not on file  Occupational History   Not on file  Tobacco Use   Smoking status: Never   Smokeless tobacco: Never  Vaping Use   Vaping Use: Never used  Substance and Sexual Activity   Alcohol use: No   Drug use: No   Sexual activity: Not Currently  Other Topics Concern   Not on file  Social History Narrative   Not on file   Social Determinants of Health   Financial Resource Strain: Not on file  Food Insecurity: No Food Insecurity (12/06/2022)   Hunger Vital Sign    Worried About Running Out of Food in the Last Year: Never true    Ran Out of Food in the Last Year: Never true  Transportation Needs: No Transportation Needs (12/06/2022)   PRAPARE - Hydrologist (Medical): No    Lack of Transportation (Non-Medical): No  Physical Activity: Not on file  Stress: Not on file  Social Connections: Not on file   Family History  Problem Relation Age of Onset   Cancer Father        liver   Hypertension Brother    Heart disease Maternal Grandmother    Kidney  disease Maternal Grandfather    Diabetes Maternal Grandfather    Kidney failure Brother    Scheduled Meds:  amiodarone  200 mg Oral Daily   aspirin  325 mg Oral Daily   atorvastatin  40 mg Oral Daily   levothyroxine  175 mcg Oral QAC breakfast   metoprolol tartrate  12.5 mg Oral BID   pantoprazole  40 mg Oral BID   Continuous Infusions: PRN Meds:.acetaminophen **OR** acetaminophen (TYLENOL) oral liquid 160 mg/5 mL **OR** acetaminophen, hydrALAZINE, labetalol, ondansetron (ZOFRAN) IV Medications Prior to Admission:  Prior to Admission medications   Medication Sig Start Date End Date Taking? Authorizing Provider  acetaminophen (TYLENOL) 325 MG tablet Take 2 tablets (650 mg total) by mouth every 6 (six)  hours as needed for mild pain. Patient taking differently: Take 650 mg by mouth every 12 (twelve) hours as needed for mild pain or moderate pain. 09/03/21  Yes Samella Parr, NP  apixaban (ELIQUIS) 5 MG TABS tablet Take 1 tablet (5 mg total) by mouth 2 (two) times daily. Please cancel previous refill order.  Sent to wrong pharmacy. 01/15/22  Yes BranchAlphonse Guild, MD  atorvastatin (LIPITOR) 20 MG tablet Take 20 mg by mouth every evening. 10/18/22  Yes [provider]  Cholecalciferol 50 MCG (2000 UT) TABS Take 2,000 Units by mouth every evening.   Yes [provider]  dabigatran (PRADAXA) 150 MG CAPS capsule Take 1 capsule (150 mg total) by mouth 2 (two) times daily. 12/13/22 02/11/23 Yes Shah, Pratik D, DO  levothyroxine (SYNTHROID) 150 MCG tablet Take 150 mcg by mouth in the morning. 10/18/22  Yes [provider]  melatonin 3 MG TABS tablet Take 3 mg by mouth daily as needed (insomnia). 06/21/22  Yes [provider]  metoprolol tartrate (LOPRESSOR) 25 MG tablet TAKE 1/2 TABLET 2 TIMES A DAY Patient taking differently: Take 12.5 mg by mouth 2 (two) times daily. 02/25/22  Yes BranchAlphonse Guild, MD  thiamine (VITAMIN B1) 100 MG tablet Take 100 mg by mouth  every evening.   Yes [provider]  amiodarone (PACERONE) 200 MG tablet TAKE 1 TABLET DAILY Patient not taking: Reported on 12/06/2022 02/25/22   Arnoldo Lenis, MD  aspirin 325 MG tablet Take 1 tablet (325 mg total) by mouth daily for 3 days. 12/10/22 12/13/22  Manuella Ghazi, Pratik D, DO  atorvastatin (LIPITOR) 40 MG tablet Take 1 tablet (40 mg total) by mouth daily. 12/10/22 01/09/23  Manuella Ghazi, Pratik D, DO  feeding supplement (ENSURE ENLIVE / ENSURE PLUS) LIQD Take 237 mLs by mouth 3 (three) times daily between meals. Patient not taking: Reported on 12/08/2021 09/03/21   Samella Parr, NP  ferrous sulfate 325 (65 FE) MG EC tablet Take 1 tablet (325 mg total) by mouth 2 (two) times daily. Patient not taking: Reported on 12/08/2021 07/17/21 07/17/22  Kathie Dike, MD  ipratropium-albuterol (DUONEB) 0.5-2.5 (3) MG/3ML SOLN Take 3 mLs by nebulization every 2 (two) hours as needed. Patient not taking: Reported on 12/08/2021 09/03/21   Samella Parr, NP  liver oil-zinc oxide (DESITIN) 40 % ointment Apply topically as needed for irritation. Patient not taking: Reported on 12/08/2021 09/03/21   Samella Parr, NP  mirtazapine (REMERON) 7.5 MG tablet Take 1 tablet (7.5 mg total) by mouth at bedtime. Patient not taking: Reported on 12/06/2022 09/03/21   Samella Parr, NP  Multiple Vitamin (MULTIVITAMIN WITH MINERALS) TABS tablet Take 1 tablet by mouth daily. Patient not taking: Reported on 12/08/2021 09/07/21   Samella Parr, NP  OXYGEN Inhale 2 L/min into the lungs as needed (for shortness of breath). Patient not taking: Reported on 12/06/2022    [provider]  pantoprazole (PROTONIX) 40 MG tablet TAKE 1 TABLET 2 TIMES A DAY Patient not taking: Reported on 12/06/2022 01/14/22   Annitta Needs, NP  potassium chloride SA (KLOR-CON M) 20 MEQ tablet TAKE 1 TABLET DAILY Patient not taking: Reported on 12/06/2022 02/25/22   Arnoldo Lenis, MD  torsemide (DEMADEX) 20 MG tablet TAKE 1 TABLET  ONCE A DAY Patient not taking: Reported on 12/06/2022 02/25/22   Arnoldo Lenis, MD   Allergies  Allergen Reactions   Codeine Itching and Other (See Comments)    "Allergic,"  per [facility]   Compazine [Prochlorperazine Edisylate] Other (See Comments)    "Allergic," per MAR   Prednisone Nausea And Vomiting and Other (See Comments)    "Allergic," per MAR   Tetanus Toxoids Other (See Comments)    Knot like "hen's egg" came up and "Allergic," per [facility]   Tetracyclines & Related Other (See Comments)    Listed on MAR from facility   Vicodin [Hydrocodone-Acetaminophen] Itching and Other (See Comments)    "Allergic," per [facility]   Review of Systems  Unable to perform ROS: Other    Physical Exam Vitals and nursing note reviewed.  Constitutional:      General: She is not in acute distress.    Appearance: She is obese. She is ill-appearing.  Neurological:     Mental Status: She is alert.     Vital Signs: BP (!) 142/77 (BP Location: Left Arm)   Pulse 64   Temp 97.6 F (36.4 C) (Oral)   Resp 18   Ht '5\' 2"'$  (1.575 m)   Wt 111.1 kg   LMP 09/13/2016   SpO2 96%   BMI 44.82 kg/m  Pain Scale: 0-10   Pain Score: Asleep   SpO2: SpO2: 96 % O2 Device:SpO2: 96 % O2 Flow Rate: .   IO: Intake/output summary:  Intake/Output Summary (Last 24 hours) at 12/09/2022 1253 Last data filed at 12/09/2022 0537 Gross per 24 hour  Intake 940 ml  Output 1750 ml  Net -810 ml    LBM: Last BM Date : 12/08/22 Baseline Weight: Weight: 111.1 kg Most recent weight: Weight: 111.1 kg     Palliative Assessment/Data:     Time In: 0900 Time Out: 0955 Time Total:  55 minutes  Greater than 50%  of this time was spent counseling and coordinating care related to the above assessment and plan.  Signed by: Drue Novel, NP   Please contact Palliative Medicine Team phone at [phone] for questions and concerns.  For individual provider: See Shea Evans

## 2022-12-09 NOTE — Progress Notes (Signed)
Patient discharged to [facility] called and given to Nonie Hoyer RN. [facility] to transport patient to awaiting facility. IV discontinued,catheter intact.

## 2022-12-09 NOTE — Plan of Care (Signed)
Pt able to hold cup to give self fluids.  At times with meds, noted applesauce ran out of left side of pt mouth, however pt was able to swallow without difficulty.     Problem: Activity: Goal: Risk for activity intolerance will decrease Outcome: Progressing   Problem: Nutrition: Goal: Adequate nutrition will be maintained Outcome: Progressing

## 2022-12-09 NOTE — TOC Transition Note (Addendum)
Transition of Care [facility]) - CM/SW Discharge Note   Patient Details  Name: Lauren [name] MRN: [id number] Date of Birth: [birth year]  Transition of Care [facility] CM/SW Contact:  Iona Beard, [place] Phone Number: 12/09/2022, 12:19 PM   Clinical Narrative:    CSW updated that pts insurance auth has been approved for SNF at [facility]. CSW updated Melissa in admissions with [facility] who states they can accept pt today. CSW provided RN with numbers for report and room. CSW spoke to Chance pts legal guardian to provide update, she is agreeable to pt returning to facility today. CSW to print med necessity to floor for RN. CSW updated RN that EMS can be called when they are ready. TOC signing off.   Final next level of care: Skilled Nursing Facility Barriers to Discharge: Barriers Resolved   Patient Goals and CMS Choice CMS Medicare.gov Compare Post Acute Care list provided to:: Legal Guardian Choice offered to / list presented to : [place] / [place]  Discharge Placement                Patient chooses bed at: [facility] Patient to be transferred to facility by: EMS Name of family member notified: Chance (DSS LG) Patient and family notified of of transfer: 12/09/22  Discharge Plan and Services Additional resources added to the After Visit Summary for   In-house Referral: Clinical Social Work                                   Social Determinants of Health ([place]) Interventions SDOH Screenings   Food Insecurity: No Food Insecurity (12/06/2022)  Housing: Low Risk  (12/06/2022)  Transportation Needs: No Transportation Needs (12/06/2022)  Utilities: Not At Risk (12/06/2022)  Tobacco Use: Low Risk  (12/06/2022)     Readmission Risk Interventions    07/12/2021   10:46 AM  Readmission Risk Prevention Plan  Transportation Screening Complete  Home Care Screening Complete  Medication Review (RN CM) Complete

## 2023-03-10 ENCOUNTER — Encounter: Payer: Self-pay | Admitting: Neurology

## 2023-03-10 ENCOUNTER — Ambulatory Visit (INDEPENDENT_AMBULATORY_CARE_PROVIDER_SITE_OTHER): Payer: Medicare HMO | Admitting: Neurology

## 2023-03-10 VITALS — BP 139/81 | HR 67 | Ht 62.0 in

## 2023-03-10 DIAGNOSIS — I63511 Cerebral infarction due to unspecified occlusion or stenosis of right middle cerebral artery: Secondary | ICD-10-CM

## 2023-03-10 DIAGNOSIS — G8114 Spastic hemiplegia affecting left nondominant side: Secondary | ICD-10-CM

## 2023-03-10 DIAGNOSIS — I482 Chronic atrial fibrillation, unspecified: Secondary | ICD-10-CM | POA: Diagnosis not present

## 2023-03-10 MED ORDER — BACLOFEN 10 MG PO TABS: 10.0000 mg | ORAL_TABLET | Freq: Three times a day (TID) | ORAL | 0 refills | Status: DC

## 2023-03-10 NOTE — Patient Instructions (Signed)
I had a long d/w patient and her state guardian about her recent stroke, spastic hemiplegia, chronic atrial fibrillation,risk for recurrent stroke/TIAs, personally independently reviewed imaging studies and stroke evaluation results and answered questions.Continue Pradaxa (dabigatran) twice a day  for secondary stroke prevention and maintain strict control of hypertension with blood pressure goal below 130/90, diabetes with hemoglobin A1c goal below 6.5% and lipids with LDL cholesterol goal below 70 mg/dL. I also advised the patient to eat a healthy diet with plenty of whole grains, cereals, fruits and vegetables, exercise regularly and maintain ideal body weight.  Recommend physical and Occupational Therapy.  Trial of baclofen 5 mg 3 times daily to be increased to 10 mg 3 times daily as tolerated for spasticity.  Follow-up with Dr. Terrace Arabia for consideration for Botox injections (followup in the future with me only as needed.  Stroke Prevention Some medical conditions and behaviors can lead to a higher chance of having a stroke. You can help prevent a stroke by eating healthy, exercising, not smoking, and managing any medical conditions you have. Stroke is a leading cause of functional impairment. Primary prevention is particularly important because a majority of strokes are first-time events. Stroke changes the lives of not only those who experience a stroke but also their family and other caregivers. How can this condition affect me? A stroke is a medical emergency and should be treated right away. A stroke can lead to brain damage and can sometimes be life-threatening. If a person gets medical treatment right away, there is a better chance of surviving and recovering from a stroke. What can increase my risk? The following medical conditions may increase your risk of a stroke: Cardiovascular disease. High blood pressure (hypertension). Diabetes. High cholesterol. Sickle cell disease. Blood clotting  disorders (hypercoagulable state). Obesity. Sleep disorders (obstructive sleep apnea). Other risk factors include: Being older than age 95. Having a history of blood clots, stroke, or mini-stroke (transient ischemic attack, TIA). Genetic factors, such as race, ethnicity, or a family history of stroke. Smoking cigarettes or using other tobacco products. Taking birth control pills, especially if you also use tobacco. Heavy use of alcohol or drugs, especially cocaine and methamphetamine. Physical inactivity. What actions can I take to prevent this? Manage your health conditions High cholesterol levels. Eating a healthy diet is important for preventing high cholesterol. If cholesterol cannot be managed through diet alone, you may need to take medicines. Take any prescribed medicines to control your cholesterol as told by your health care provider. Hypertension. To reduce your risk of stroke, try to keep your blood pressure below 130/80. Eating a healthy diet and exercising regularly are important for controlling blood pressure. If these steps are not enough to manage your blood pressure, you may need to take medicines. Take any prescribed medicines to control hypertension as told by your health care provider. Ask your health care provider if you should monitor your blood pressure at home. Have your blood pressure checked every year, even if your blood pressure is normal. Blood pressure increases with age and some medical conditions. Diabetes. Eating a healthy diet and exercising regularly are important parts of managing your blood sugar (glucose). If your blood sugar cannot be managed through diet and exercise, you may need to take medicines. Take any prescribed medicines to control your diabetes as told by your health care provider. Get evaluated for obstructive sleep apnea. Talk to your health care provider about getting a sleep evaluation if you snore a lot or have excessive sleepiness.  Make  sure that any other medical conditions you have, such as atrial fibrillation or atherosclerosis, are managed. Nutrition Follow instructions from your health care provider about what to eat or drink to help manage your health condition. These instructions may include: Reducing your daily calorie intake. Limiting how much salt (sodium) you use to 1,500 milligrams (mg) each day. Using only healthy fats for cooking, such as olive oil, canola oil, or sunflower oil. Eating healthy foods. You can do this by: Choosing foods that are high in fiber, such as whole grains, and fresh fruits and vegetables. Eating at least 5 servings of fruits and vegetables a day. Try to fill one-half of your plate with fruits and vegetables at each meal. Choosing lean protein foods, such as lean cuts of meat, poultry without skin, fish, tofu, beans, and nuts. Eating low-fat dairy products. Avoiding foods that are high in sodium. This can help lower blood pressure. Avoiding foods that have saturated fat, trans fat, and cholesterol. This can help prevent high cholesterol. Avoiding processed and prepared foods. Counting your daily carbohydrate intake.  Lifestyle If you drink alcohol: Limit how much you have to: 0-1 drink a day for women who are not pregnant. 0-2 drinks a day for men. Know how much alcohol is in your drink. In the [place] one drink equals one 12 oz bottle of beer ( ), one 5 oz glass of wine ( ), or one 1 oz glass of hard liquor (44mL). Do not use any products that contain nicotine or tobacco. These products include cigarettes, chewing tobacco, and vaping devices, such as e-cigarettes. If you need help quitting, ask your health care provider. Avoid secondhand smoke. Do not use drugs. Activity  Try to stay at a healthy weight. Get at least 30 minutes of exercise on most days, such as: Fast walking. Biking. Swimming. Medicines Take over-the-counter and prescription medicines only as told by your  health care provider. Aspirin or blood thinners (antiplatelets or anticoagulants) may be recommended to reduce your risk of forming blood clots that can lead to stroke. Avoid taking birth control pills. Talk to your health care provider about the risks of taking birth control pills if: You are over 35 years old. You smoke. You get very bad headaches. You have had a blood clot. Where to find more information American Stroke Association: www.strokeassociation.org Get help right away if: You or a loved one has any symptoms of a stroke. "BE FAST" is an easy way to remember the main warning signs of a stroke: B - Balance. Signs are dizziness, sudden trouble walking, or loss of balance. E - Eyes. Signs are trouble seeing or a sudden change in vision. F - Face. Signs are sudden weakness or numbness of the face, or the face or eyelid drooping on one side. A - Arms. Signs are weakness or numbness in an arm. This happens suddenly and usually on one side of the body. S - Speech. Signs are sudden trouble speaking, slurred speech, or trouble understanding what people say. T - Time. Time to call emergency services. Write down what time symptoms started. You or a loved one has other signs of a stroke, such as: A sudden, severe headache with no known cause. Nausea or vomiting. Seizure. These symptoms may represent a serious problem that is an emergency. Do not wait to see if the symptoms will go away. Get medical help right away. Call your local emergency services (911 in the [place] Do not drive yourself to the hospital. Summary You  can help to prevent a stroke by eating healthy, exercising, not smoking, limiting alcohol intake, and managing any medical conditions you may have. Do not use any products that contain nicotine or tobacco. These include cigarettes, chewing tobacco, and vaping devices, such as e-cigarettes. If you need help quitting, ask your health care provider. Remember "BE FAST" for warning  signs of a stroke. Get help right away if you or a loved one has any of these signs. This information is not intended to replace advice given to you by your health care provider. Make sure you discuss any questions you have with your health care provider. Document Revised: 05/25/2020 Document Reviewed: 06/12/2020 Elsevier Patient Education  2023 [employer].

## 2023-03-10 NOTE — Progress Notes (Signed)
[facility] [address] [place]. [place] [address] [phone]       OFFICE CONSULT NOTE  Ms. Lauren [name] Date of Birth:  1946/08/30 Medical Record Number:  [id number]   Referring MD:  Maurilio Lovely, DO  Reason for Referral:  Stroke  HPI: Ms. Lauren [name] is a pleasant elderly Caucasian lady seen today for initial office consultation visit for stroke.  History is obtained from the patient and review of electronic medical records.  She is accompanied today by her state guardian CRNA skilled nursing facility that she is currently staying.  She has past medical history of chronic atrial fibrillation on Eliquis, lymphedema, colon cancer s/p Hemi colectomy, congestive heart failure, hypertension, hypothyroidism, obesity who is a nursing home resident.  She presented on 12/06/2022 with sudden onset of left hemiplegia.  She was felt not to be candidate for thrombolysis as she was supposed to be on Eliquis but was not felt to be candidate for mechanical thrombectomy due to poor baseline modified Rankin 4.  NIH stroke scale was 18.  CT head showed hyperdense right MCA sign.  CT angiogram showed right M1 occlusion just distal to the right anterior temporal artery origin.  CT perfusion scan showed a favorable penumbra with 0 core and 108 mm of tissue at risk.  Patient was not taken for mechanical thrombectomy given poor baseline functioning and being bedridden and living in the home.  MRI scan of the brain showed acute infarct involving right lentiform nucleus extending into corona radiata without any hemorrhagic transformation..  LDL cholesterol was 79 mg percent.  Hemoglobin A1c was 5.0.  Patient was advised to be switched from Eliquis to Pradaxa at that time.  Not known patient is still been refusing her medications and take Pradaxa regularly as per the CRNA and her her guardian and I counseled the patient to take her medicines regularly she stated she did not do so.  Continues to have  significant left hemiplegia with practically no left sided stent..  She has significant pain while moving her left side even to passive movement.  She has not been tried on medications like baclofen, Zanaflex or gabapentin.  Had any recurrent stroke or TIA symptoms.  He is tolerating Lipitor well without side effects.  Blood pressure is well-controlled.  She was seen previously by neurology service at the hospital in January 2023 when she presented with transient right facial droop, slurred speech and lethargy at skilled nursing facility..  Neuroimaging at that time was negative EEG was also negative for seizures  ROS:   14 system review of systems is positive for weakness, difficulty walking, pain, stiffness all other systems negative  PMH:  Past Medical History:  Diagnosis Date   Acute cholecystitis 12/06/2012   Chronic combined systolic and diastolic CHF (congestive heart failure)    Colon cancer    Diverticulosis    Hypotension    BP runs soft   Hypothyroidism    Lymphedema    Morbid obesity    Obesity    Pancreatitis, acute 12/04/2012   Periumbilical hernia    Permanent atrial fibrillation     Social History:  Social History   Socioeconomic History   Marital status: Married    Spouse name: Not on file   Number of children: Not on file   Years of education: Not on file   Highest education level: Not on file  Occupational History   Not on file  Tobacco Use   Smoking status: Never  Smokeless tobacco: Never  Vaping Use   Vaping Use: Never used  Substance and Sexual Activity   Alcohol use: No   Drug use: No   Sexual activity: Not Currently  Other Topics Concern   Not on file  Social History Narrative   Not on file   Social Determinants of Health   Financial Resource Strain: Not on file  Food Insecurity: No Food Insecurity (12/06/2022)   Hunger Vital Sign    Worried About Running Out of Food in the Last Year: Never true    Ran Out of Food in the Last Year: Never  true  Transportation Needs: No Transportation Needs (12/06/2022)   PRAPARE - Administrator, Civil Service (Medical): No    Lack of Transportation (Non-Medical): No  Physical Activity: Not on file  Stress: Not on file  Social Connections: Not on file  Intimate Partner Violence: Not At Risk (12/06/2022)   Humiliation, Afraid, Rape, and Kick questionnaire    Fear of Current or Ex-Partner: No    Emotionally Abused: No    Physically Abused: No    Sexually Abused: No    Medications:   Current Outpatient Medications on File Prior to Visit  Medication Sig Dispense Refill   acetaminophen (TYLENOL) 325 MG tablet Take 2 tablets (650 mg total) by mouth every 6 (six) hours as needed for mild pain.     acetaminophen (TYLENOL) 500 MG tablet Take 500 mg by mouth every 6 (six) hours as needed.     amiodarone (PACERONE) 200 MG tablet TAKE 1 TABLET DAILY 30 tablet 1   atorvastatin (LIPITOR) 40 MG tablet Take 1 tablet (40 mg total) by mouth daily. 30 tablet 0   Cholecalciferol 50 MCG (2000 UT) TABS Take 2,000 Units by mouth every evening.     diclofenac Sodium (VOLTAREN) 1 % GEL Apply topically as needed.     ELIQUIS 5 MG TABS tablet Take 5 mg by mouth 2 (two) times daily.     ferrous sulfate 325 (65 FE) MG EC tablet Take 1 tablet (325 mg total) by mouth 2 (two) times daily. 60 tablet 3   levothyroxine (SYNTHROID) 150 MCG tablet Take 150 mcg by mouth in the morning.     liver oil-zinc oxide (DESITIN) 40 % ointment Apply topically as needed for irritation. 56.7 g 0   melatonin 3 MG TABS tablet Take 3 mg by mouth daily as needed (insomnia).     metoprolol tartrate (LOPRESSOR) 25 MG tablet TAKE 1/2 TABLET 2 TIMES A DAY 30 tablet 1   Multiple Vitamin (MULTIVITAMIN WITH MINERALS) TABS tablet Take 1 tablet by mouth daily.     pantoprazole (PROTONIX) 40 MG tablet TAKE 1 TABLET 2 TIMES A DAY 180 tablet 3   potassium chloride SA (KLOR-CON M) 20 MEQ tablet TAKE 1 TABLET DAILY 30 tablet 1   thiamine  (VITAMIN B1) 100 MG tablet Take 100 mg by mouth every evening.     torsemide (DEMADEX) 20 MG tablet TAKE 1 TABLET ONCE A DAY 30 tablet 1   traMADol HCl 100 MG TABS Take 1 tablet by mouth every 8 (eight) hours as needed.     traZODone (DESYREL) 50 MG tablet Take by mouth.     dabigatran (PRADAXA) 150 MG CAPS capsule Take 1 capsule (150 mg total) by mouth 2 (two) times daily. 60 capsule 1   No current facility-administered medications on file prior to visit.    Allergies:   Allergies  Allergen Reactions  Codeine Itching and Other (See Comments)    "Allergic," per [facility]   Compazine [Prochlorperazine Edisylate] Other (See Comments)    "Allergic," per [facility]   Prednisone Nausea And Vomiting and Other (See Comments)    "Allergic," per [facility]   Tetanus Toxoids Other (See Comments)    Knot like "hen's egg" came up and "Allergic," per [facility]   Tetracyclines & Related Other (See Comments)    Listed on MAR from facility   Vicodin [Hydrocodone-Acetaminophen] Itching and Other (See Comments)    "Allergic," per [facility]    Physical Exam General: obese elderly Caucasian lady, seated, in no evident distress Head: head normocephalic and atraumatic.   Neck: supple with no carotid or supraclavicular bruits Cardiovascular: regular rate and rhythm, no murmurs Musculoskeletal: no deformity Skin:  no rash/petichiae Vascular:  Normal pulses all extremities bilateral lower extremity lymphedema  Neurologic Exam Mental Status: Awake and fully alert. Oriented to place and time. Recent and remote memory intact. Attention span, concentration and fund of knowledge appropriate. Mood and affect appropriate.  Cranial Nerves: Fundoscopic exam reveals sharp disc margins. Pupils equal, briskly reactive to light. Extraocular movements full without nystagmus. Visual fields full showed left hemianopsia confrontation. Hearing intact. Facial sensation intact.  Mild left lower facial asymmetry., tongue, palate moves normally and  symmetrically.  Motor: Spastic left hemiplegia with fixed flexion contractures of the fingers and the left wrist with increased tone. Sensory.: i diminished left hemibody sensation to touch , pinprick , position and vibratory sensation on the left side.  Coordination: Rapid alternating movements normal in all extremities. Finger-to-nose and heel-to-shin performed accurately bilaterally. Gait and Station: Patient unable to walk.   Reflexes: 2+ and asymmetric and brisker on the left. Toes downgoing.   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  Vascular risk factors of chronic atrial fibrillation, hypertension, congestive heart failure, obesity and mild hyperlipidemia     PLAN:I had a long d/w patient and her state guardian about her recent stroke, spastic hemiplegia, chronic atrial fibrillation,risk for recurrent stroke/TIAs, personally independently reviewed imaging studies and stroke evaluation results and answered questions.Continue Pradaxa (dabigatran) twice a day  for secondary stroke prevention and maintain strict control of hypertension with blood pressure goal below 130/90, diabetes with hemoglobin A1c goal below 6.5% and lipids with LDL cholesterol goal below 70 mg/dL. I also advised the patient to eat a healthy diet with plenty of whole grains, cereals, fruits and vegetables, exercise regularly and maintain ideal body weight.  Recommend physical and Occupational Therapy.  Trial of baclofen 5 mg 3 times daily to be increased to 10 mg 3 times daily as tolerated for spasticity.  Follow-up with Dr. Terrace Arabia for consideration for Botox injections (followup in the future with me only as needed.  Greater than 50% time during this 45-minute consultation visit was spent on counseling and coordination about her stroke and spastic hemiplegia and  atrial fibrillation and answering questions  Delia Heady, MD  Note: This document was prepared with digital dictation and possible smart phrase technology. Any transcriptional errors that result from this process are unintentional.

## 2023-03-27 ENCOUNTER — Encounter: Payer: Self-pay | Admitting: Neurology

## 2023-03-27 ENCOUNTER — Ambulatory Visit (INDEPENDENT_AMBULATORY_CARE_PROVIDER_SITE_OTHER): Payer: Medicare HMO | Admitting: Neurology

## 2023-03-27 VITALS — BP 166/91 | HR 77 | Ht 61.0 in | Wt 245.0 lb

## 2023-03-27 DIAGNOSIS — G8114 Spastic hemiplegia affecting left nondominant side: Secondary | ICD-10-CM | POA: Diagnosis not present

## 2023-03-27 DIAGNOSIS — I482 Chronic atrial fibrillation, unspecified: Secondary | ICD-10-CM | POA: Diagnosis not present

## 2023-03-27 DIAGNOSIS — I63511 Cerebral infarction due to unspecified occlusion or stenosis of right middle cerebral artery: Secondary | ICD-10-CM | POA: Insufficient documentation

## 2023-03-27 NOTE — Progress Notes (Signed)
Chief Complaint  Patient presents with   Consult    Rm 14, cna present  Referral from Dr. Pearlean Brownie to Dr. 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 female   Painful spastic left hemiparesis following her stroke on December 06, 2022,  She should be a good candidate for botulism toxin injection to alleviate her spastic left upper extremity pain, but she refused it at this point  Continue rehabilitation  and follow-up with Dr. Pearlean Brownie  DIAGNOSTIC DATA (LABS, IMAGING, TESTING) - I reviewed patient records, labs, notes, testing and imaging myself where available.   MEDICAL HISTORY:  Lauren [name] is a 77 year old female, seen in request by Dr. Pearlean Brownie, Janalyn Shy S for evaluation of potential botulism toxin injection for spastic left hemiparesis,   I reviewed and summarized the referring note.  Past medical history Colon cancer Hypertension Atrial fibrillation on Eliquis Hypothyroidism Stroke  She was a nursing home resident, had a sudden onset of left hemiparesis December 06, 2022, was treated at [facility], MRI of the brain showed acute infarction involving right lentiform nucleus extending to corona radiata  LDL 79  A1c 5.0  Since then, she had very painful significant spastic left hemiparesis, no antigravity movement of left upper and lower extremity, left upper extremity stay in wrist finger flexion, elbow flexion pronation, shoulder adduction,   Patient was able to clearly state the date of the stroke, able to give reasonable medical history, when she was questioned multiple times whether she wants to receive Buddhism toxin for her spastic painful left upper extremity," I do not want to do it"  PHYSICAL EXAM:   Vitals:   03/27/23 0835  BP: (!) 166/91  Pulse: 77  Weight: 245 lb (111.1 kg)  Height: 5\' 1"  (1.549 m)   Not recorded     Body  mass index is 46.29 kg/m.  PHYSICAL EXAMNIATION:  Gen: NAD, conversant, well nourised, well groomed                     Cardiovascular: Regular rate rhythm, no peripheral edema, warm, nontender. Eyes: Conjunctivae clear without exudates or hemorrhage Neck: Supple, no carotid bruits. Pulmonary: Clear to auscultation bilaterally   NEUROLOGICAL EXAM:  MENTAL STATUS: Speech/cognition: Depressed looking elderly female, awake, alert, oriented to history taking and casual conversation CRANIAL NERVES: CN II: Visual fields are full to confrontation. Pupils are round equal and briskly reactive to light. CN III, IV, VI: extraocular movement are normal. No ptosis. CN V: Facial sensation is intact to light touch CN VII: Face is symmetric with normal eye closure  CN VIII: Hearing is normal to causal conversation. CN IX, X: Phonation is normal. CN XI: Head turning and shoulder shrug are intact  MOTOR: Spastic left hemiparesis, left shoulder abduction, left elbow pronation, flexion, wrist flexion, finger flexion, thumb in position, noticeable pain with passive stretch, but still have good range of motion  No antigravity movement of left leg,  REFLEXES: Hyperreflexia on the left side  SENSORY: No Harwani extinction  COORDINATION: There is no trunk or limb dysmetria noted.  GAIT/STANCE: Deferred  REVIEW OF SYSTEMS:  Full 14 system review of systems performed and notable only for as above All other review of systems were negative.   ALLERGIES: Allergies  Allergen Reactions   Codeine Itching and Other (See Comments)    "Allergic," per [facility]  Compazine [Prochlorperazine Edisylate] Other (See Comments)    "Allergic," per [facility]   Prednisone Nausea And Vomiting and Other (See Comments)    "Allergic," per [facility]   Tetanus Toxoids Other (See Comments)    Knot like "hen's egg" came up and "Allergic," per [facility]   Tetracyclines & Related Other (See Comments)    Listed on MAR from facility    Vicodin [Hydrocodone-Acetaminophen] Itching and Other (See Comments)    "Allergic," per [facility]   Hydrocodone Itching    HOME MEDICATIONS: Current Outpatient Medications  Medication Sig Dispense Refill   acetaminophen (TYLENOL) 325 MG tablet Take 2 tablets (650 mg total) by mouth every 6 (six) hours as needed for mild pain.     acetaminophen (TYLENOL) 500 MG tablet Take 500 mg by mouth every 6 (six) hours as needed.     amiodarone (PACERONE) 200 MG tablet TAKE 1 TABLET DAILY 30 tablet 1   atorvastatin (LIPITOR) 40 MG tablet Take 1 tablet (40 mg total) by mouth daily. 30 tablet 0   Cholecalciferol 50 MCG (2000 UT) TABS Take 2,000 Units by mouth every evening.     diclofenac Sodium (VOLTAREN) 1 % GEL Apply topically as needed.     divalproex (DEPAKOTE) 125 MG DR tablet Take 125 mg by mouth 3 (three) times daily.     ELIQUIS 5 MG TABS tablet Take 5 mg by mouth 2 (two) times daily.     ferrous sulfate 325 (65 FE) MG EC tablet Take 1 tablet (325 mg total) by mouth 2 (two) times daily. 60 tablet 3   levothyroxine (SYNTHROID) 150 MCG tablet Take 150 mcg by mouth in the morning.     liver oil-zinc oxide (DESITIN) 40 % ointment Apply topically as needed for irritation. 56.7 g 0   loratadine (CLARITIN) 5 MG chewable tablet Chew 5 mg by mouth daily.     melatonin 3 MG TABS tablet Take 3 mg by mouth daily as needed (insomnia).     metoprolol tartrate (LOPRESSOR) 25 MG tablet TAKE 1/2 TABLET 2 TIMES A DAY 30 tablet 1   Multiple Vitamin (MULTIVITAMIN WITH MINERALS) TABS tablet Take 1 tablet by mouth daily.     pantoprazole (PROTONIX) 40 MG tablet TAKE 1 TABLET 2 TIMES A DAY 180 tablet 3   potassium chloride SA (KLOR-CON M) 20 MEQ tablet TAKE 1 TABLET DAILY 30 tablet 1   thiamine (VITAMIN B1) 100 MG tablet Take 100 mg by mouth every evening.     torsemide (DEMADEX) 20 MG tablet TAKE 1 TABLET ONCE A DAY 30 tablet 1   traMADol HCl 100 MG TABS Take 1 tablet by mouth every 8 (eight) hours as needed.      traZODone (DESYREL) 50 MG tablet Take by mouth.     No current facility-administered medications for this visit.    PAST MEDICAL HISTORY: Past Medical History:  Diagnosis Date   Acute cholecystitis 12/06/2012   Chronic combined systolic and diastolic CHF (congestive heart failure) (HCC)    Colon cancer (HCC)    Diverticulosis    Hypotension    BP runs soft   Hypothyroidism    Lymphedema    Morbid obesity (HCC)    Obesity    Pancreatitis, acute 12/04/2012   Periumbilical hernia    Permanent atrial fibrillation (HCC)     PAST SURGICAL HISTORY: Past Surgical History:  Procedure Laterality Date   BIOPSY  07/05/2021   Procedure: BIOPSY;  Surgeon: Lanelle Bal, DO;  Location: [facility];  Service: Endoscopy;;   CHOLECYSTECTOMY N/A 03/25/2014   Procedure: LAPAROSCOPIC CHOLECYSTECTOMY WITH INTRAOPERATIVE CHOLANGIOGRAM;  Surgeon: Axel Filler, MD;  Location: [facility] OR;  Service: General;  Laterality: N/A;   COLONOSCOPY WITH PROPOFOL N/A 07/05/2021   Procedure: COLONOSCOPY WITH PROPOFOL;  Surgeon: Lanelle Bal, DO;  Location: [facility];  Service: Endoscopy;  Laterality: N/A;   ESOPHAGOGASTRODUODENOSCOPY (EGD) WITH PROPOFOL N/A 07/05/2021   Procedure: ESOPHAGOGASTRODUODENOSCOPY (EGD) WITH PROPOFOL;  Surgeon: Lanelle Bal, DO;  Location: [facility];  Service: Endoscopy;  Laterality: N/A;   PARTIAL COLECTOMY N/A 07/06/2021   Procedure: RIGHT HEMICOLECTOMY;  Surgeon: Franky Macho, MD;  Location: [facility];  Service: General;  Laterality: N/A;   POLYPECTOMY  07/05/2021   Procedure: POLYPECTOMY;  Surgeon: Lanelle Bal, DO;  [facility]: [facility];  Service: Endoscopy;;    FAMILY HISTORY: Family History  Problem Relation Age of Onset   Cancer Father        liver   Hypertension Brother    Heart disease Maternal Grandmother    Kidney disease Maternal Grandfather    Diabetes Maternal Grandfather    Kidney failure Brother     SOCIAL HISTORY: Social History    Socioeconomic History   Marital status: Married    Spouse name: Not on file   Number of children: Not on file   Years of education: Not on file   Highest education level: Not on file  Occupational History   Not on file  Tobacco Use   Smoking status: Never   Smokeless tobacco: Never  Vaping Use   Vaping Use: Never used  Substance and Sexual Activity   Alcohol use: No   Drug use: No   Sexual activity: Not Currently  Other Topics Concern   Not on file  Social History Narrative   Not on file   Social Determinants of Health   Financial Resource Strain: Not on file  Food Insecurity: No Food Insecurity (12/06/2022)   Hunger Vital Sign    Worried About Running Out of Food in the Last Year: Never true    Ran Out of Food in the Last Year: Never true  Transportation Needs: No Transportation Needs (12/06/2022)   PRAPARE - Administrator, Civil Service (Medical): No    Lack of Transportation (Non-Medical): No  Physical Activity: Not on file  Stress: Not on file  Social Connections: Not on file  Intimate Partner Violence: Not At Risk (12/06/2022)   Humiliation, Afraid, Rape, and Kick questionnaire    Fear of Current or Ex-Partner: No    Emotionally Abused: No    Physically Abused: No    Sexually Abused: No      Levert Feinstein, M.D. Ph.D.  [facility] [address] [place], [place] [address] Ph: [phone] Fax: [phone]  CC:  Micki Riley, MD [address] [place],  [place] [address]  Dennard Nip, NP

## 2024-11-15 ENCOUNTER — Ambulatory Visit: Admitting: Cardiology

## 2025-01-24 ENCOUNTER — Ambulatory Visit: Admitting: Cardiology
